# Patient Record
Sex: Female | Born: 1950 | Race: White | Hispanic: No | Smoking: Former smoker
Health system: Southern US, Community
[De-identification: ages and names within clinical notes are randomized; demographics above are authoritative.]

## PROBLEM LIST (undated history)

## (undated) DIAGNOSIS — I739 Peripheral vascular disease, unspecified: Secondary | ICD-10-CM

## (undated) DIAGNOSIS — T8859XA Other complications of anesthesia, initial encounter: Secondary | ICD-10-CM

## (undated) DIAGNOSIS — F32A Depression, unspecified: Secondary | ICD-10-CM

## (undated) DIAGNOSIS — Z9289 Personal history of other medical treatment: Secondary | ICD-10-CM

## (undated) DIAGNOSIS — M24529 Contracture, unspecified elbow: Secondary | ICD-10-CM

## (undated) DIAGNOSIS — N3281 Overactive bladder: Secondary | ICD-10-CM

## (undated) DIAGNOSIS — R011 Cardiac murmur, unspecified: Secondary | ICD-10-CM

## (undated) DIAGNOSIS — G8929 Other chronic pain: Secondary | ICD-10-CM

## (undated) DIAGNOSIS — I951 Orthostatic hypotension: Secondary | ICD-10-CM

## (undated) DIAGNOSIS — I10 Essential (primary) hypertension: Secondary | ICD-10-CM

## (undated) DIAGNOSIS — J984 Other disorders of lung: Secondary | ICD-10-CM

## (undated) DIAGNOSIS — F419 Anxiety disorder, unspecified: Secondary | ICD-10-CM

## (undated) DIAGNOSIS — H25012 Cortical age-related cataract, left eye: Secondary | ICD-10-CM

## (undated) DIAGNOSIS — N390 Urinary tract infection, site not specified: Secondary | ICD-10-CM

## (undated) DIAGNOSIS — T4145XA Adverse effect of unspecified anesthetic, initial encounter: Secondary | ICD-10-CM

## (undated) DIAGNOSIS — D509 Iron deficiency anemia, unspecified: Secondary | ICD-10-CM

## (undated) DIAGNOSIS — R1312 Dysphagia, oropharyngeal phase: Secondary | ICD-10-CM

## (undated) DIAGNOSIS — J439 Emphysema, unspecified: Secondary | ICD-10-CM

## (undated) DIAGNOSIS — Z87442 Personal history of urinary calculi: Secondary | ICD-10-CM

## (undated) DIAGNOSIS — E1140 Type 2 diabetes mellitus with diabetic neuropathy, unspecified: Secondary | ICD-10-CM

## (undated) DIAGNOSIS — E785 Hyperlipidemia, unspecified: Secondary | ICD-10-CM

## (undated) DIAGNOSIS — F329 Major depressive disorder, single episode, unspecified: Secondary | ICD-10-CM

## (undated) DIAGNOSIS — M21372 Foot drop, left foot: Secondary | ICD-10-CM

## (undated) DIAGNOSIS — K219 Gastro-esophageal reflux disease without esophagitis: Secondary | ICD-10-CM

## (undated) DIAGNOSIS — K922 Gastrointestinal hemorrhage, unspecified: Secondary | ICD-10-CM

## (undated) DIAGNOSIS — I639 Cerebral infarction, unspecified: Secondary | ICD-10-CM

## (undated) DIAGNOSIS — M1990 Unspecified osteoarthritis, unspecified site: Secondary | ICD-10-CM

## (undated) DIAGNOSIS — M24531 Contracture, right wrist: Secondary | ICD-10-CM

## (undated) DIAGNOSIS — R32 Unspecified urinary incontinence: Secondary | ICD-10-CM

## (undated) DIAGNOSIS — R262 Difficulty in walking, not elsewhere classified: Secondary | ICD-10-CM

## (undated) DIAGNOSIS — R29810 Facial weakness: Secondary | ICD-10-CM

## (undated) DIAGNOSIS — I6529 Occlusion and stenosis of unspecified carotid artery: Secondary | ICD-10-CM

## (undated) DIAGNOSIS — Z8719 Personal history of other diseases of the digestive system: Secondary | ICD-10-CM

## (undated) DIAGNOSIS — G8191 Hemiplegia, unspecified affecting right dominant side: Secondary | ICD-10-CM

## (undated) DIAGNOSIS — K5900 Constipation, unspecified: Secondary | ICD-10-CM

## (undated) DIAGNOSIS — G2581 Restless legs syndrome: Secondary | ICD-10-CM

## (undated) DIAGNOSIS — I350 Nonrheumatic aortic (valve) stenosis: Secondary | ICD-10-CM

## (undated) DIAGNOSIS — J189 Pneumonia, unspecified organism: Secondary | ICD-10-CM

## (undated) HISTORY — PX: LITHOTRIPSY: SUR834

## (undated) HISTORY — DX: Cortical age-related cataract, left eye: H25.012

## (undated) HISTORY — PX: JOINT REPLACEMENT: SHX530

## (undated) HISTORY — DX: Cerebral infarction, unspecified: I63.9

## (undated) HISTORY — DX: Other disorders of lung: J98.4

---

## 1982-03-03 HISTORY — PX: CHOLECYSTECTOMY: SHX55

## 1997-03-03 HISTORY — PX: KIDNEY STONE SURGERY: SHX686

## 2000-06-24 ENCOUNTER — Other Ambulatory Visit: Admission: RE | Admit: 2000-06-24 | Discharge: 2000-06-24 | Payer: Self-pay | Admitting: Gynecology

## 2000-07-30 ENCOUNTER — Encounter: Payer: Self-pay | Admitting: Gynecology

## 2000-07-31 ENCOUNTER — Other Ambulatory Visit: Admission: RE | Admit: 2000-07-31 | Discharge: 2000-07-31 | Payer: Self-pay | Admitting: Gynecology

## 2000-08-06 ENCOUNTER — Inpatient Hospital Stay (HOSPITAL_COMMUNITY): Admission: RE | Admit: 2000-08-06 | Discharge: 2000-08-08 | Payer: Self-pay | Admitting: Gynecology

## 2000-08-09 ENCOUNTER — Encounter: Payer: Self-pay | Admitting: Emergency Medicine

## 2000-08-09 ENCOUNTER — Emergency Department (HOSPITAL_COMMUNITY): Admission: EM | Admit: 2000-08-09 | Discharge: 2000-08-09 | Payer: Self-pay | Admitting: Emergency Medicine

## 2001-03-03 HISTORY — PX: ABDOMINAL HYSTERECTOMY: SUR658

## 2001-03-03 HISTORY — PX: BYPASS GRAFT: SHX909

## 2006-03-03 HISTORY — PX: TOTAL HIP ARTHROPLASTY: SHX124

## 2009-03-03 HISTORY — PX: APPENDECTOMY: SHX54

## 2009-03-03 HISTORY — PX: HERNIA REPAIR: SHX51

## 2011-03-04 HISTORY — PX: CAROTID STENT INSERTION: SHX5766

## 2011-05-02 DIAGNOSIS — I639 Cerebral infarction, unspecified: Secondary | ICD-10-CM

## 2011-05-02 HISTORY — DX: Cerebral infarction, unspecified: I63.9

## 2011-05-08 ENCOUNTER — Emergency Department (HOSPITAL_COMMUNITY): Payer: Medicare Other

## 2011-05-08 ENCOUNTER — Inpatient Hospital Stay (HOSPITAL_COMMUNITY): Payer: Medicare Other

## 2011-05-08 ENCOUNTER — Encounter (HOSPITAL_COMMUNITY): Payer: Self-pay | Admitting: Radiology

## 2011-05-08 ENCOUNTER — Other Ambulatory Visit: Payer: Self-pay

## 2011-05-08 ENCOUNTER — Inpatient Hospital Stay (HOSPITAL_COMMUNITY)
Admission: EM | Admit: 2011-05-08 | Discharge: 2011-05-14 | DRG: 041 | Disposition: A | Discharge status: Rehabilitation Facility | Payer: Medicare Other | Source: Ambulatory Visit | Attending: Internal Medicine | Admitting: Internal Medicine

## 2011-05-08 DIAGNOSIS — Z96649 Presence of unspecified artificial hip joint: Secondary | ICD-10-CM

## 2011-05-08 DIAGNOSIS — E785 Hyperlipidemia, unspecified: Secondary | ICD-10-CM | POA: Diagnosis present

## 2011-05-08 DIAGNOSIS — I10 Essential (primary) hypertension: Secondary | ICD-10-CM | POA: Diagnosis present

## 2011-05-08 DIAGNOSIS — IMO0001 Reserved for inherently not codable concepts without codable children: Secondary | ICD-10-CM | POA: Diagnosis present

## 2011-05-08 DIAGNOSIS — R220 Localized swelling, mass and lump, head: Secondary | ICD-10-CM | POA: Diagnosis present

## 2011-05-08 DIAGNOSIS — H5500 Unspecified nystagmus: Secondary | ICD-10-CM | POA: Diagnosis present

## 2011-05-08 DIAGNOSIS — G8190 Hemiplegia, unspecified affecting unspecified side: Secondary | ICD-10-CM | POA: Diagnosis present

## 2011-05-08 DIAGNOSIS — I6350 Cerebral infarction due to unspecified occlusion or stenosis of unspecified cerebral artery: Principal | ICD-10-CM | POA: Diagnosis present

## 2011-05-08 DIAGNOSIS — E1149 Type 2 diabetes mellitus with other diabetic neurological complication: Secondary | ICD-10-CM | POA: Diagnosis present

## 2011-05-08 DIAGNOSIS — I359 Nonrheumatic aortic valve disorder, unspecified: Secondary | ICD-10-CM

## 2011-05-08 DIAGNOSIS — F329 Major depressive disorder, single episode, unspecified: Secondary | ICD-10-CM | POA: Diagnosis present

## 2011-05-08 DIAGNOSIS — J984 Other disorders of lung: Secondary | ICD-10-CM | POA: Diagnosis present

## 2011-05-08 DIAGNOSIS — G2581 Restless legs syndrome: Secondary | ICD-10-CM | POA: Diagnosis present

## 2011-05-08 DIAGNOSIS — R221 Localized swelling, mass and lump, neck: Secondary | ICD-10-CM | POA: Diagnosis present

## 2011-05-08 DIAGNOSIS — R29810 Facial weakness: Secondary | ICD-10-CM | POA: Diagnosis present

## 2011-05-08 DIAGNOSIS — Z720 Tobacco use: Secondary | ICD-10-CM | POA: Diagnosis present

## 2011-05-08 DIAGNOSIS — F3289 Other specified depressive episodes: Secondary | ICD-10-CM | POA: Diagnosis present

## 2011-05-08 DIAGNOSIS — I639 Cerebral infarction, unspecified: Secondary | ICD-10-CM | POA: Diagnosis present

## 2011-05-08 DIAGNOSIS — R4789 Other speech disturbances: Secondary | ICD-10-CM | POA: Diagnosis present

## 2011-05-08 DIAGNOSIS — Z79899 Other long term (current) drug therapy: Secondary | ICD-10-CM

## 2011-05-08 DIAGNOSIS — R918 Other nonspecific abnormal finding of lung field: Secondary | ICD-10-CM | POA: Diagnosis present

## 2011-05-08 DIAGNOSIS — F17200 Nicotine dependence, unspecified, uncomplicated: Secondary | ICD-10-CM | POA: Diagnosis present

## 2011-05-08 HISTORY — DX: Major depressive disorder, single episode, unspecified: F32.9

## 2011-05-08 HISTORY — DX: Peripheral vascular disease, unspecified: I73.9

## 2011-05-08 HISTORY — DX: Hyperlipidemia, unspecified: E78.5

## 2011-05-08 HISTORY — DX: Depression, unspecified: F32.A

## 2011-05-08 HISTORY — DX: Essential (primary) hypertension: I10

## 2011-05-08 LAB — COMPREHENSIVE METABOLIC PANEL
ALT: 21 U/L (ref 0–35)
Albumin: 3.7 g/dL (ref 3.5–5.2)
Alkaline Phosphatase: 104 U/L (ref 39–117)
Potassium: 4.1 mEq/L (ref 3.5–5.1)
Sodium: 130 mEq/L — ABNORMAL LOW (ref 135–145)
Total Protein: 7.6 g/dL (ref 6.0–8.3)

## 2011-05-08 LAB — LIPID PANEL
HDL: 25 mg/dL — ABNORMAL LOW (ref >39)
LDL Cholesterol: 135 mg/dL — ABNORMAL HIGH (ref 0–99)
Total CHOL/HDL Ratio: 8.6 RATIO

## 2011-05-08 LAB — POCT I-STAT, CHEM 8
BUN: 16 mg/dL (ref 6–23)
Chloride: 98 mEq/L (ref 96–112)
HCT: 52 % — ABNORMAL HIGH (ref 36.0–46.0)
Sodium: 132 mEq/L — ABNORMAL LOW (ref 135–145)
TCO2: 27 mmol/L (ref 0–100)

## 2011-05-08 LAB — CBC
MCH: 32 pg (ref 26.0–34.0)
MCHC: 35.8 g/dL (ref 30.0–36.0)
Platelets: 141 10*3/uL — ABNORMAL LOW (ref 150–400)
RBC: 5.53 MIL/uL — ABNORMAL HIGH (ref 3.87–5.11)

## 2011-05-08 LAB — DIFFERENTIAL
Basophils Relative: 0 % (ref 0–1)
Eosinophils Absolute: 0 10*3/uL (ref 0.0–0.7)
Neutrophils Relative %: 76 % (ref 43–77)

## 2011-05-08 LAB — CK TOTAL AND CKMB (NOT AT ARMC): Total CK: 50 U/L (ref 7–177)

## 2011-05-08 LAB — IRON AND TIBC: Iron: <10 ug/dL — ABNORMAL LOW (ref 42–135)

## 2011-05-08 LAB — BASIC METABOLIC PANEL
BUN: 15 mg/dL (ref 6–23)
CO2: 27 mEq/L (ref 19–32)
Calcium: 9.4 mg/dL (ref 8.4–10.5)
Chloride: 95 mEq/L — ABNORMAL LOW (ref 96–112)
Creatinine, Ser: 0.52 mg/dL (ref 0.50–1.10)

## 2011-05-08 LAB — POCT I-STAT TROPONIN I: Troponin i, poc: 0.01 ng/mL (ref 0.00–0.08)

## 2011-05-08 LAB — GLUCOSE, CAPILLARY: Glucose-Capillary: 239 mg/dL — ABNORMAL HIGH (ref 70–99)

## 2011-05-08 LAB — HEMOGLOBIN A1C: Hgb A1c MFr Bld: 10.2 % — ABNORMAL HIGH (ref <5.7)

## 2011-05-08 LAB — FERRITIN: Ferritin: 115 ng/mL (ref 10–291)

## 2011-05-08 LAB — PROTIME-INR
INR: 0.91 (ref 0.00–1.49)
Prothrombin Time: 12.5 seconds (ref 11.6–15.2)

## 2011-05-08 LAB — TROPONIN I: Troponin I: <0.3 ng/mL (ref <0.30)

## 2011-05-08 MED ORDER — GABAPENTIN 300 MG PO CAPS
300.0000 mg | ORAL_CAPSULE | Freq: Every day | ORAL | Status: DC
  Administered 2011-05-08 – 2011-05-09 (×2): 300 mg via ORAL
  Filled 2011-05-08 (×3): qty 1

## 2011-05-08 MED ORDER — ACETAMINOPHEN 650 MG RE SUPP: 650.0000 mg | RECTAL | Status: DC | PRN

## 2011-05-08 MED ORDER — SODIUM CHLORIDE 0.9 % IV SOLN
INTRAVENOUS | Status: DC
  Administered 2011-05-08: 03:00:00 via INTRAVENOUS

## 2011-05-08 MED ORDER — INSULIN ASPART 100 UNIT/ML ~~LOC~~ SOLN
0.0000 [IU] | Freq: Three times a day (TID) | SUBCUTANEOUS | Status: DC
  Administered 2011-05-08: 11 [IU] via SUBCUTANEOUS
  Administered 2011-05-08 – 2011-05-09 (×2): 3 [IU] via SUBCUTANEOUS
  Administered 2011-05-09 – 2011-05-10 (×3): 5 [IU] via SUBCUTANEOUS
  Administered 2011-05-10: 2 [IU] via SUBCUTANEOUS
  Administered 2011-05-10: 5 [IU] via SUBCUTANEOUS
  Administered 2011-05-11 (×2): 3 [IU] via SUBCUTANEOUS
  Administered 2011-05-11: 2 [IU] via SUBCUTANEOUS
  Administered 2011-05-12: 5 [IU] via SUBCUTANEOUS
  Administered 2011-05-13 – 2011-05-14 (×4): 3 [IU] via SUBCUTANEOUS
  Filled 2011-05-08: qty 3

## 2011-05-08 MED ORDER — ROPINIROLE HCL 1 MG PO TABS
3.0000 mg | ORAL_TABLET | ORAL | Status: AC
  Administered 2011-05-08: 3 mg via ORAL
  Filled 2011-05-08: qty 3

## 2011-05-08 MED ORDER — ROPINIROLE HCL 1 MG PO TABS
3.0000 mg | ORAL_TABLET | Freq: Every day | ORAL | Status: DC
  Administered 2011-05-08 – 2011-05-13 (×6): 3 mg via ORAL
  Filled 2011-05-08 (×7): qty 3

## 2011-05-08 MED ORDER — GABAPENTIN 600 MG PO TABS: 300.0000 mg | ORAL_TABLET | Freq: Two times a day (BID) | ORAL | Status: DC

## 2011-05-08 MED ORDER — ASPIRIN 325 MG PO TABS
325.0000 mg | ORAL_TABLET | Freq: Every day | ORAL | Status: DC
  Administered 2011-05-08 – 2011-05-12 (×5): 325 mg via ORAL
  Filled 2011-05-08 (×5): qty 1

## 2011-05-08 MED ORDER — GABAPENTIN 600 MG PO TABS
600.0000 mg | ORAL_TABLET | ORAL | Status: AC
  Administered 2011-05-08: 600 mg via ORAL
  Filled 2011-05-08: qty 1

## 2011-05-08 MED ORDER — ENOXAPARIN SODIUM 40 MG/0.4ML ~~LOC~~ SOLN
40.0000 mg | SUBCUTANEOUS | Status: DC
  Administered 2011-05-08 – 2011-05-14 (×7): 40 mg via SUBCUTANEOUS
  Filled 2011-05-08 (×7): qty 0.4

## 2011-05-08 MED ORDER — LABETALOL HCL 5 MG/ML IV SOLN
10.0000 mg | INTRAVENOUS | Status: DC | PRN
  Administered 2011-05-09 – 2011-05-11 (×3): 10 mg via INTRAVENOUS
  Filled 2011-05-08 (×3): qty 4

## 2011-05-08 MED ORDER — CITALOPRAM HYDROBROMIDE 20 MG PO TABS
20.0000 mg | ORAL_TABLET | Freq: Every day | ORAL | Status: DC
  Administered 2011-05-08 – 2011-05-14 (×7): 20 mg via ORAL
  Filled 2011-05-08 (×7): qty 1

## 2011-05-08 MED ORDER — GABAPENTIN 600 MG PO TABS
600.0000 mg | ORAL_TABLET | Freq: Every day | ORAL | Status: DC
  Administered 2011-05-08 – 2011-05-13 (×6): 600 mg via ORAL
  Filled 2011-05-08 (×7): qty 1

## 2011-05-08 MED ORDER — SODIUM CHLORIDE 0.9 % IV SOLN
INTRAVENOUS | Status: DC
  Administered 2011-05-08: 10:00:00 via INTRAVENOUS
  Administered 2011-05-09: 50 mL/h via INTRAVENOUS

## 2011-05-08 MED ORDER — ONDANSETRON HCL 4 MG/2ML IJ SOLN
4.0000 mg | Freq: Four times a day (QID) | INTRAMUSCULAR | Status: DC | PRN
  Administered 2011-05-10: 4 mg via INTRAVENOUS
  Filled 2011-05-08: qty 2

## 2011-05-08 MED ORDER — ACETAMINOPHEN 325 MG PO TABS: 650.0000 mg | ORAL_TABLET | ORAL | Status: DC | PRN

## 2011-05-08 MED ORDER — SIMVASTATIN 20 MG PO TABS
20.0000 mg | ORAL_TABLET | Freq: Every day | ORAL | Status: DC
  Administered 2011-05-08 – 2011-05-09 (×2): 20 mg via ORAL
  Filled 2011-05-08 (×3): qty 1

## 2011-05-08 MED ORDER — LISINOPRIL 20 MG PO TABS
20.0000 mg | ORAL_TABLET | Freq: Every day | ORAL | Status: DC
  Administered 2011-05-08 – 2011-05-09 (×2): 20 mg via ORAL
  Filled 2011-05-08 (×3): qty 1

## 2011-05-08 MED ORDER — ASPIRIN 300 MG RE SUPP
300.0000 mg | Freq: Every day | RECTAL | Status: DC
  Filled 2011-05-08 (×4): qty 1

## 2011-05-08 MED ORDER — LABETALOL HCL 5 MG/ML IV SOLN
INTRAVENOUS | Status: AC
  Administered 2011-05-08: 10 mg via INTRAVENOUS
  Filled 2011-05-08: qty 4

## 2011-05-08 MED ORDER — SENNOSIDES-DOCUSATE SODIUM 8.6-50 MG PO TABS: 1.0000 | ORAL_TABLET | Freq: Every evening | ORAL | Status: DC | PRN

## 2011-05-08 NOTE — ED Notes (Signed)
Patient with restless leg problems.  Patient requesting meds for RLS.  MD notified.  New orders per Dr Rubin Payor.

## 2011-05-08 NOTE — Progress Notes (Signed)
05/08/2011 Adylin Hankey SPARKS Case Management Note [phone]  Utilization review completed.  

## 2011-05-08 NOTE — Progress Notes (Signed)
Inpatient Diabetes Program Recommendations  AACE/ADA: New Consensus Statement on Inpatient Glycemic Control (2009)  Target Ranges:  Prepandial:   less than 140 mg/dL      Peak postprandial:   less than 180 mg/dL (1-2 hours)      Critically ill patients:  140 - 180 mg/dL   Reason for Visit: Hyperglycemia in high 200's and 300's  Inpatient Diabetes Program Recommendations Insulin - Basal: Add 10 units Lantus while here. Correction (SSI): Please change to q 4hrs wile NPO HgbA1C: CBG's are high, please check present value while here.  Note: Thank you, Lenor Coffin, RN, CNS, Diabetes Coordinator [phone])

## 2011-05-08 NOTE — Progress Notes (Signed)
Physical Therapy Evaluation Patient Details Name: Chelsey Gallagher MRN: [id number] DOB: 03-02-1951 Today's Date: 05/08/2011  Problem List:  Patient Active Problem List  Diagnoses  . CVA (cerebral infarction)  . Hypertension  . Diabetes mellitus    Past Medical History:  Past Medical History  Diagnosis Date  . Diabetes mellitus   . Hypertension   . Depression   . PAD (peripheral artery disease)     S/p bypass grafting, unclear exactly where  . Nephrolithiasis    Past Surgical History:  Past Surgical History  Procedure Date  . Total hip arthroplasty     Right  . Abdominal hysterectomy   . Cholecystectomy 1984  . Appendectomy     PT Assessment/Plan/Recommendation PT Assessment Clinical Impression Statement: Pt presents with a medical diagnosis of CVA with impulsivity and decreased safety awareness. Pt also presents with decreased sensation and strength in her RLE although has had some return since admission. Pt will benefit from skilled PT in the acute care setting in order to improve safety, motor control and strength prior to d/c. PT Recommendation/Assessment: Patient will need skilled PT in the acute care venue PT Problem List: Decreased strength;Decreased range of motion;Decreased activity tolerance;Decreased balance;Decreased mobility;Decreased knowledge of use of DME;Decreased safety awareness;Decreased knowledge of precautions PT Therapy Diagnosis : Hemiplegia dominant side;Difficulty walking PT Plan PT Frequency: Min 4X/week PT Treatment/Interventions: DME instruction;Gait training;Stair training;Functional mobility training;Therapeutic activities;Therapeutic exercise;Balance training;Neuromuscular re-education;Patient/family education PT Recommendation Recommendations for Other Services: Rehab consult Follow Up Recommendations: Inpatient Rehab;Supervision/Assistance - 24 hour Equipment Recommended: Defer to next venue PT Goals  Acute Rehab PT Goals PT Goal  Formulation: With patient/family Time For Goal Achievement: 2 weeks Pt will go Supine/Side to Sit: with modified independence PT Goal: Supine/Side to Sit - Progress: Goal set today Pt will Sit at [facility] of Bed: with modified independence;3-5 min;with bilateral upper extremity support PT Goal: Sit at Edge Of Bed - Progress: Goal set today Pt will go Sit to Supine/Side: with modified independence PT Goal: Sit to Supine/Side - Progress: Goal set today Pt will go Sit to Stand: with min assist;with upper extremity assist PT Goal: Sit to Stand - Progress: Goal set today Pt will go Stand to Sit: with min assist;with upper extremity assist PT Goal: Stand to Sit - Progress: Goal set today Pt will Stand: with supervision;with bilateral upper extremity support;3 - 5 min PT Goal: Stand - Progress: Goal set today Pt will Ambulate: 16 - 50 feet;with min assist;with least restrictive assistive device PT Goal: Ambulate - Progress: Goal set today Pt will Perform Home Exercise Program: Independently PT Goal: Perform Home Exercise Program - Progress: Goal set today  PT Evaluation Precautions/Restrictions  Precautions Precautions: Fall Required Braces or Orthoses: No Restrictions Weight Bearing Restrictions: No Prior Functioning  Home Living Lives With: Alone Receives Help From: Family (intermittent assistance) Type of Home: House Home Layout: One level Home Access: Stairs to enter Entrance Stairs-Rails: None Entrance Stairs-Number of Steps: 1 (inch step in) Bathroom Shower/Tub: Walk-in shower;Door [employer] Toilet: Handicapped height Bathroom Accessibility: Yes How Accessible: Accessible via walker Home Adaptive Equipment: Walker - rolling;Bedside commode/3-in-1;Reacher Prior Function Level of Independence: Independent with basic ADLs;Independent with homemaking with ambulation;Independent with transfers;Independent with gait Able to Take Stairs?: Yes Driving: Yes Vocation: Retired Leisure:  Hobbies-yes (Comment) Comments: reading, computer Cognition Cognition Arousal/Alertness: Awake/alert Overall Cognitive Status: Impaired Orientation Level: Oriented X4 Safety/Judgement: Decreased safety judgement for tasks assessed Decreased Safety/Judgement: Impulsive Safety/Judgement - Other Comments: Pt very impulsive with movements and required constant  cuing to slow down. Awareness of Deficits: Other (comment) (aware of deficits, although still impulsive) Sensation/Coordination Sensation Light Touch: Impaired Detail Light Touch Impaired Details: Absent RUE;Impaired RLE (able to feel pain 2/4 times on RLE) Stereognosis: Impaired Detail Stereognosis Impaired Details: Absent RUE Proprioception: Impaired Detail Proprioception Impaired Details: Absent RUE Additional Comments: Pt reports that R UE feels like "pins and needles" Coordination Gross Motor Movements are Fluid and Coordinated: No (RUE) Fine Motor Movements are Fluid and Coordinated: No (RUE) Heel Shin Test: unable to complete secondary to weakness Extremity Assessment RUE Assessment RUE Assessment: Exceptions to [facility] RUE AROM (degrees) Overall AROM Right Upper Extremity: Deficits (No AROM) RUE Strength RUE Overall Strength Comments: 0/5 RUE Tone RUE Tone Comments: flaccid LUE Assessment LUE Assessment: Within Functional Limits RLE Assessment RLE Assessment: Exceptions to [facility] RLE AROM (degrees) Overall AROM Right Lower Extremity: Deficits;Due to decreased strength RLE Overall AROM Comments: No AROM ankle; decreased knee RLE Strength Right Hip Flexion: 3-/5 Right Knee Flexion: 2+/5 Right Knee Extension: 2+/5 Right Ankle Dorsiflexion: 2-/5 Right Ankle Plantar Flexion: 2-/5 LLE Assessment LLE Assessment: Within Functional Limits Mobility (including Balance) Bed Mobility Bed Mobility: Yes Rolling Right: 3: Mod assist;With rail Rolling Right Details (indicate cue type and reason): assist to control movement and  for safety due to pt's impulsive behavior Right Sidelying to Sit: 1: +2 Total assist;Patient percentage (comment);With rails (30%) Right Sidelying to Sit Details (indicate cue type and reason): assist to support trunk and shoulders OOB (Assist with RLE for control) Sitting - Scoot to Edge of Bed: 3: Mod assist Sitting - Scoot to Edge of Bed Details (indicate cue type and reason): assist for safety due to impulsive behavior (VC for sequencing and hand placement) Transfers Transfers: Yes Sit to Stand: 1: +2 Total assist;Patient percentage (comment);From bed;From chair/3-in-1;With upper extremity assist;With armrests (50%) Sit to Stand Details (indicate cue type and reason): assist to block Rt. knee and support Rt. UE and manual cues provided throughout bil. hips to promote upright posture. VC for safe Lt. hand placement. Stand to Sit: 1: +2 Total assist;Patient percentage (comment);To chair/3-in-1;With armrests (50%) Stand to Sit Details: assist to control descent and support Rt. UE. VC to for safe Lt. hand placement. Stand Pivot Transfers: 1: +2 Total assist;Patient percentage (comment) (30%) Stand Pivot Transfer Details (indicate cue type and reason): Assist x 1 to control buckling on RLE and assist of other for stability and control as well as tactile cues for proper sequencing both from bed to 3-1 and from 3-1 to chair. Transferred to right side for increased mobility. Pt required cueing throughout for safety secondary to impulsivity Ambulation/Gait Ambulation/Gait: No  Balance Balance Assessed: Yes Static Sitting Balance Static Sitting - Balance Support: Left upper extremity supported;Feet supported Static Sitting - Level of Assistance: 3: Mod assist Static Sitting - Comment/# of Minutes: Mod assist secondary to pt with right lean unsupported. Exercise    End of Session PT - End of Session Equipment Utilized During Treatment: Gait belt Activity Tolerance: Patient tolerated treatment  well Patient left: in chair;with call bell in reach;with family/visitor present Nurse Communication: Mobility status for transfers;Mobility status for ambulation General Behavior During Session: Other (comment) (impulsive) Cognition: Impaired Cognitive Impairment: decreased safety awareness  Milana Kidney 05/08/2011, 5:24 PM  05/08/2011 Milana Kidney DPT PAGER: [phone] OFFICE: [phone]

## 2011-05-08 NOTE — Progress Notes (Signed)
  Echocardiogram 2D Echocardiogram has been performed.  Chelsey Gallagher 05/08/2011, 5:48 PM

## 2011-05-08 NOTE — ED Notes (Signed)
CHECKED CBG BY EMT R Eriq [id number]

## 2011-05-08 NOTE — ED Notes (Signed)
Patient LSN at 2215 last evening, feeling funny as she went to bed.  Patient with no dizziness, but does have slurred speech, right sided facial droop and right leg weakness.  Patient arrived by EMS, CBG=298.  NSR rate in 70's.

## 2011-05-08 NOTE — Progress Notes (Signed)
Subjective:  Chelsey Gallagher is an 61 y.o. female who reports that she laid down about 10pm and felt fine at that time. Got up about 1015 and felt nauseous and dizzy. Had some vomiting. Looked in the mirror and noted that she had a right facial droop. Then noted numbness in her right upper extremity. It went to her right lower extremity and then to the right ear. She attempted to call her daughter but had difficulty managing to et to the phone and then dial. When speaking noticed that her speech was slurred. Her daughter called EMS. EMS initiated the code stroke She presented beyond IV TPA window. She continues to have slurred speech, right facial weakness right hemiplegia and sensory loss. She also complains of intermittent horizontal diplopia on right gaze     Objective: Vital signs in last 24 hours: Temp:  [97.4 F (36.3 C)-97.9 F (36.6 C)] 97.9 F (36.6 C) (03/07 0413) Pulse Rate:  [75-92] 92  (03/07 0535) Resp:  [14-20] 20  (03/07 0535) BP: (137-207)/(67-102) 161/70 mmHg (03/07 0608) SpO2:  [91 %-96 %] 91 % (03/07 0535) Weight:  [67.087 kg (147 lb 14.4 oz)] 67.087 kg (147 lb 14.4 oz) (03/07 0645) Weight change:  Last BM Date: 05/07/11  Intake/Output from previous day:   Intake/Output this shift:    Physical exam   pleasant middle-aged Caucasian lady currently not in distress. Afebrile. Head is nontraumatic. Neck is supple with soft carotid bruit. Hearing appears decreased on the right. Distal pulses are well.Cardiac exam no murmur or gallop. Lungs are clear to auscultation. Abdomen soft nontender. Neurological Exam awake alert oriented to time place and person. Moderate dysarthria but can be understood. Follows commands well. Right gaze preference but able to look to the left. Mild hypertrophia of the right eye. Rotatory nystagmus on lateral gaze right more than left. Moderate right lower facial weakness. Tongue is midline. Dense right upper extremity plegia with 0/5 strength.  Right lower extremity grade 2-3/5 strength. Diminished sensation on the right. Impaired coordination on the right. Gait was not tested.  Lab Results:  Basename 05/08/11 0221 05/08/11 0219  WBC -- 7.0  HGB 17.7* 17.7*  HCT 52.0* 49.5*  PLT -- 141*   BMET  Basename 05/08/11 0221 05/08/11 0219  NA 132* 130*  K 4.0 4.1  CL 98 92*  CO2 -- 26  GLUCOSE 314* 309*  BUN 16 14  CREATININE 0.40* 0.48*  CALCIUM -- 10.1    Studies/Results: Dg Chest 2 View  05/08/2011  *RADIOLOGY REPORT*  Clinical Data: Stroke, right-sided weakness, shortness of breath  CHEST - 2 VIEW  Comparison: None.  Findings: Lungs are essentially clear. No pleural effusion or pneumothorax.  Cardiomediastinal silhouette is within normal limits.  Degenerative changes of the visualized thoracolumbar spine.  Surgical clips in the right upper abdomen.  IMPRESSION: No evidence of acute cardiopulmonary disease.  Original Report Authenticated By: Charline Bills, M.D.   Ct Head Wo Contrast  05/08/2011  *RADIOLOGY REPORT*  Clinical Data:  Code stroke, right side weakness  CT HEAD WITHOUT CONTRAST  Technique:  Contiguous axial images were obtained from the base of the skull through the vertex without contrast.  Comparison: None  Findings: Normal ventricular morphology. No midline shift or mass effect. Normal appearance of brain parenchyma. No intracranial hemorrhage, mass lesion or evidence of acute infarction. No extra-axial fluid collections. Atherosclerotic calcification within internal carotid arteries at skull base bilaterally. Visualized paranasal sinuses and mastoid air cells clear. Bones unremarkable.  IMPRESSION: No  acute intracranial abnormalities.  These results were called by telephone on 05/08/2011  at  0230 hrs to  Dr. Rubin Payor, who verbally acknowledged these results.  Original Report Authenticated By: Lollie Marrow, M.D.     Medications: I have reviewed the patient's current medications.  Assessment/Plan:  61 y.o.  female presenting with nystagmus, dysarthria and right hemiparesis of acute onset from right [place] infarct. Patient with multiple risk factors for stroke. CT shows no evidence for bleed. Patient outside time window for tPA.   Stroke work up initiated. Stroke Risk Factors - diabetes mellitus, hyperlipidemia, hypertension and smoking   Plan ; check swallow eval by speech therapy. Physical, occupational therapy consults. Check MRI scan today , Doppler studies, echocardiogram, fasting lipid profile and Hb A1c. Discussed with patient and family members and answered questions   LOS: 1 day   Jeannene Tschetter,PRAMODKUMAR P 05/08/2011, 10:31 AM

## 2011-05-08 NOTE — Evaluation (Signed)
Clinical/Bedside Swallow Evaluation Patient Details  Name: Chelsey Gallagher MRN: [id number] DOB: 1950-09-02 Today's Date: 05/08/2011  Past Medical History:  Past Medical History  Diagnosis Date  . Diabetes mellitus   . Hypertension   . Depression   . PAD (peripheral artery disease)     S/p bypass grafting, unclear exactly where  . Nephrolithiasis    Past Surgical History:  Past Surgical History  Procedure Date  . Total hip arthroplasty     Right  . Abdominal hysterectomy   . Cholecystectomy 1984  . Appendectomy    HPI:  61 yr old admitted with right sided weakness.  No acute events on CT.  Pt. initially passed RN stroke swallow screen then exhibited difficulty taking pills so she was made NPO and ST swallow assessment ordered.  Pt. reported some difficulty taking larger pills at home and twice a week with coughing episodes with emesis.    Assessment/Recommendations/Treatment Plan    SLP Assessment Clinical Impression Statement: Pt. exhibited min-mild oral dysphagia with mild right sided residue with cracker and slower transit.  Pharygneal phase appeared WFL's for all consistencies assessed.  Oral weakness increases aspiration risk slightly.  Recommend Dys 3 diet with thin liquids (no straws) and pills whole in applesauce.  Prognosis to upgrade to regular texture soon is good. Risk for Aspiration: Mild Other Related Risk Factors: Lethargy  Swallow Evaluation Recommendations Solid Consistency: Dysphagia 3 (Mechanical soft) Liquid Consistency: Thin Liquid Administration via: Cup;No straw Medication Administration: Whole meds with liquid Supervision: Full supervision/cueing for compensatory strategies;Patient able to self feed Compensations: Slow rate;Small sips/bites;Check for pocketing Postural Changes and/or Swallow Maneuvers: Seated upright 90 degrees Oral Care Recommendations: Oral care BID Recommendations for Other Services: OT consult;PT consult;Rehab consult Follow up  Recommendations:  (TBD)  Treatment Plan Treatment Plan Recommendations: Therapy as outlined in treatment plan below Speech Therapy Frequency: min 2x/week Treatment Duration: 2 weeks Interventions: Aspiration precaution training;Diet toleration management by SLP;Trials of upgraded texture/liquids;Patient/family education;Compensatory techniques  Prognosis Prognosis for Safe Diet Advancement: Good  Individuals Consulted Consulted and Agree with Results and Recommendations: Patient;Family member/caregiver  Swallowing Goals  SLP Swallowing Goals Patient will consume recommended diet without observed clinical signs of aspiration with: Minimal assistance Patient will utilize recommended strategies during swallow to increase swallowing safety with: Minimal assistance  Swallow Study General  Respiratory Status: Room air Behavior/Cognition: Alert;Cooperative;Pleasant mood Oral Cavity - Dentition: Adequate natural dentition Vision: Functional for self-feeding Patient Positioning: Upright in bed Baseline Vocal Quality: Clear Volitional Cough: Strong Volitional Swallow: Able to elicit  Oral Motor/Sensory Function  Overall Oral Motor/Sensory Function: Impaired Labial ROM: Reduced right Labial Symmetry: Within Functional Limits Labial Strength: Reduced Labial Sensation: Reduced Lingual ROM: Within Functional Limits Lingual Symmetry: Within Functional Limits Lingual Sensation: Within Functional Limits Facial ROM: Within Functional Limits Facial Symmetry: Within Functional Limits Velum:  (decreased) Mandible: Within Functional Limits  Consistency Results  Ice Chips Ice chips: Within functional limits Presentation: Spoon  Thin Liquid Thin Liquid: Within functional limits Presentation: Cup  Nectar Thick Liquid Nectar Thick Liquid: Not tested  Honey Thick Liquid Honey Thick Liquid: Not tested  Puree Puree: Within functional limits Presentation: Spoon;Self  Fed  Solid Solid: Impaired Presentation: Self Fed Oral Phase Impairments: Impaired anterior to posterior transit Oral Phase Functional Implications: Oral residue   Breck Coons Tariyah Pendry M.Ed [employer] [phone]  05/08/2011

## 2011-05-08 NOTE — Code Documentation (Addendum)
61 yo wf brought in via [facility] for Code Stroke encoded at 0150. Pt stated she went to bed at 2200 and was unable to sleep and got up at 2215 when she noticed that she felt unusual. She noticed at that point that she had Rt side weakness. Code stroke called 0154, pt arrival 0213, edp exam 0213, stroke team arrival 0202, LSN 2215, pt arrival in CT 0220, phlebotomist arrival 0213, code stroke cancelled 0236. NIH 5, Rt arm drift, Rt leg can't resist gravity, mild dysarthria, mild sensory loss, positive for nystagmus.  Pt does express feelings of dizziness.

## 2011-05-08 NOTE — Progress Notes (Signed)
Patient ID: Chelsey Gallagher, female   DOB: [birth year], 61 y.o.   MRN: [id number]  Subjective: No events overnight. Patient denies chest pain, shortness of breath, abdominal pain.  Objective:  Vital signs in last 24 hours:  Filed Vitals:   05/08/11 1000 05/08/11 1200 05/08/11 1500 05/08/11 1700  BP: 175/76 130/70 101/68 110/70  Pulse: 85 80 81 80  Temp: 98 F (36.7 C) 98.5 F (36.9 C) 98.7 F (37.1 C) 98.5 F (36.9 C)  TempSrc: Oral Oral Oral Oral  Resp: 18 17 18 17   Height:      Weight:      SpO2: 91% 94% 95% 95%    Intake/Output from previous day:  No intake or output data in the 24 hours ending 05/08/11 2048  Physical Exam: General: Alert, awake, oriented x3, in no acute distress. HEENT: No bruits, no goiter. Moist mucous membranes, no scleral icterus, no conjunctival pallor. Heart: Regular rate and rhythm, S1/S2 +, no murmurs, rubs, gallops. Lungs: Clear to auscultation bilaterally. No wheezing, no rhonchi, no rales.  Abdomen: Soft, nontender, nondistended, positive bowel sounds. Extremities: No clubbing or cyanosis, no pitting edema,  positive pedal pulses. Neuro: Slurred speech with facial droop, right sided paralysis, sensation intact to soft touch bilaterally  Lab Results:  Basic Metabolic Panel:    Component Value Date/Time   NA 130* 05/08/2011 1227   K 4.1 05/08/2011 1227   CL 95* 05/08/2011 1227   CO2 27 05/08/2011 1227   BUN 15 05/08/2011 1227   CREATININE 0.52 05/08/2011 1227   GLUCOSE 275* 05/08/2011 1227   CALCIUM 9.4 05/08/2011 1227   CBC:    Component Value Date/Time   WBC 7.0 05/08/2011 0219   HGB 17.7* 05/08/2011 0221   HCT 52.0* 05/08/2011 0221   PLT 141* 05/08/2011 0219   MCV 89.5 05/08/2011 0219   NEUTROABS 5.3 05/08/2011 0219   LYMPHSABS 1.2 05/08/2011 0219   MONOABS 0.4 05/08/2011 0219   EOSABS 0.0 05/08/2011 0219   BASOSABS 0.0 05/08/2011 0219      Lab 05/08/11 0221 05/08/11 0219  WBC -- 7.0  HGB 17.7* 17.7*  HCT 52.0* 49.5*  PLT -- 141*  MCV -- 89.5    MCH -- 32.0  MCHC -- 35.8  RDW -- 13.6  LYMPHSABS -- 1.2  MONOABS -- 0.4  EOSABS -- 0.0  BASOSABS -- 0.0  BANDABS -- --    Lab 05/08/11 1227 05/08/11 0221 05/08/11 0219  NA 130* 132* 130*  K 4.1 4.0 4.1  CL 95* 98 92*  CO2 27 -- 26  GLUCOSE 275* 314* 309*  BUN 15 16 14   CREATININE 0.52 0.40* 0.48*  CALCIUM 9.4 -- 10.1  MG -- -- --    Lab 05/08/11 0219  INR 0.91  PROTIME --   Cardiac markers:  Lab 05/08/11 0219  CKMB 2.2  TROPONINI <0.30  MYOGLOBIN --   No components found with this basename: POCBNP:3 No results found for this or any previous visit (from the past 240 hour(s)).  Studies/Results: Dg Chest 2 View  05/08/2011  *RADIOLOGY REPORT*  Clinical Data: Stroke, right-sided weakness, shortness of breath  CHEST - 2 VIEW  Comparison: None.  Findings: Lungs are essentially clear. No pleural effusion or pneumothorax.  Cardiomediastinal silhouette is within normal limits.  Degenerative changes of the visualized thoracolumbar spine.  Surgical clips in the right upper abdomen.  IMPRESSION: No evidence of acute cardiopulmonary disease.  Original Report Authenticated By: Charline Bills, M.D.   Ct Head Wo Contrast  05/08/2011  *RADIOLOGY REPORT*  Clinical Data:  Code stroke, right side weakness  CT HEAD WITHOUT CONTRAST  Technique:  Contiguous axial images were obtained from the base of the skull through the vertex without contrast.  Comparison: None  Findings: Normal ventricular morphology. No midline shift or mass effect. Normal appearance of brain parenchyma. No intracranial hemorrhage, mass lesion or evidence of acute infarction. No extra-axial fluid collections. Atherosclerotic calcification within internal carotid arteries at skull base bilaterally. Visualized paranasal sinuses and mastoid air cells clear. Bones unremarkable.  IMPRESSION: No acute intracranial abnormalities.  These results were called by telephone on 05/08/2011  at  0230 hrs to  Dr. Rubin Payor, who verbally  acknowledged these results.  Original Report Authenticated By: Lollie Marrow, M.D.   Mr Brain Wo Contrast  05/08/2011  *RADIOLOGY REPORT*  Clinical Data:  Right-sided weakness with slurred speech and facial droop. High blood pressure.  Diabetes.  MRI HEAD WITHOUT CONTRAST MRA HEAD WITHOUT CONTRAST  Technique: Multiplanar, multiecho pulse sequences of the brain and surrounding structures were obtained according to standard protocol without intravenous contrast.  Angiographic images of the head were obtained using MRA technique without contrast.  Comparison: 05/08/2011 head CT.  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 Tiny acute non hemorrhagic posterior left cerebellar infarct.  Mild to moderate nonspecific white matter type changes most consistent with result of small vessel disease in this hypertensive diabetic patient.  This involves the subcortical periventricular region as well as a central pons.  Cava septum pellucidum et vergae incidentally noted.  No hydrocephalus.  Abnormal appearance of the left vertebral artery and left carotid artery.  Please see below.  Polypoid opacification inferior aspect right maxillary sinus. Minimal paranasal sinus mucosal thickening.  No intracranial mass lesion detected on this unenhanced motion degraded exam.  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  Tiny acute non hemorrhagic posterior left cerebellar infarct.  Please see above.  MRA HEAD  Findings: Exam limited by motion.  Left internal carotid artery is small in caliber.  This may be related to proximal stenosis.  Superimposed atherosclerotic type changes (dissection not excluded).  Nonvisualization left vertebral artery and left PICA.  This may be related to the occlusion of atherosclerosis.  Dissection not excluded.  Ectatic right internal carotid artery.  Small bulge proximal cavernous segment may be related to atherosclerosis rather than aneurysm.   Stability can be confirmed on follow-up.  Prominent ectasia right carotid terminus.  No definitive aneurysm. Evaluation limited by motion.  Mild to moderate narrowing of the M1 segment of the right middle cerebral artery.  Mild to moderate narrowing M1 segment left middle cerebral artery.  Hypoplastic versus markedly diseased A1 segment of the left anterior cerebral artery.  Middle cerebral artery moderate branch vessel irregularity.  Mild narrowing distal right vertebral artery.  Mild to moderate irregularity with areas of mild to moderate narrowing basilar artery.  Nonvisualization right AICA.  Fetal type origin of the right posterior cerebral artery.  Moderate to marked narrowing involving portions of the posterior cerebral artery bilaterally.  IMPRESSION: Left internal carotid artery is small in caliber.  This may be related to proximal stenosis.  Superimposed atherosclerotic type changes (dissection not excluded).  Nonvisualization left vertebral artery and left PICA.  This may be related to the occlusion of atherosclerosis.  Dissection not excluded.  Please see above.  Original Report Authenticated By: Fuller Canada, M.D.   Mr Mra Head/brain Wo Cm  05/08/2011  *RADIOLOGY  REPORT*  Clinical Data:  Right-sided weakness with slurred speech and facial droop. High blood pressure.  Diabetes.  MRI HEAD WITHOUT CONTRAST MRA HEAD WITHOUT CONTRAST  Technique: Multiplanar, multiecho pulse sequences of the brain and surrounding structures were obtained according to standard protocol without intravenous contrast.  Angiographic images of the head were obtained using MRA technique without contrast.  Comparison: 05/08/2011 head CT.  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 Tiny acute non hemorrhagic posterior left cerebellar infarct.  Mild to moderate nonspecific white matter type changes most consistent with result of small vessel disease in this hypertensive diabetic patient.  This  involves the subcortical periventricular region as well as a central pons.  Cava septum pellucidum et vergae incidentally noted.  No hydrocephalus.  Abnormal appearance of the left vertebral artery and left carotid artery.  Please see below.  Polypoid opacification inferior aspect right maxillary sinus. Minimal paranasal sinus mucosal thickening.  No intracranial mass lesion detected on this unenhanced motion degraded exam.  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  Tiny acute non hemorrhagic posterior left cerebellar infarct.  Please see above.  MRA HEAD  Findings: Exam limited by motion.  Left internal carotid artery is small in caliber.  This may be related to proximal stenosis.  Superimposed atherosclerotic type changes (dissection not excluded).  Nonvisualization left vertebral artery and left PICA.  This may be related to the occlusion of atherosclerosis.  Dissection not excluded.  Ectatic right internal carotid artery.  Small bulge proximal cavernous segment may be related to atherosclerosis rather than aneurysm.  Stability can be confirmed on follow-up.  Prominent ectasia right carotid terminus.  No definitive aneurysm. Evaluation limited by motion.  Mild to moderate narrowing of the M1 segment of the right middle cerebral artery.  Mild to moderate narrowing M1 segment left middle cerebral artery.  Hypoplastic versus markedly diseased A1 segment of the left anterior cerebral artery.  Middle cerebral artery moderate branch vessel irregularity.  Mild narrowing distal right vertebral artery.  Mild to moderate irregularity with areas of mild to moderate narrowing basilar artery.  Nonvisualization right AICA.  Fetal type origin of the right posterior cerebral artery.  Moderate to marked narrowing involving portions of the posterior cerebral artery bilaterally.  IMPRESSION: Left internal carotid artery is small in caliber.  This may be related to proximal stenosis.  Superimposed  atherosclerotic type changes (dissection not excluded).  Nonvisualization left vertebral artery and left PICA.  This may be related to the occlusion of atherosclerosis.  Dissection not excluded.  Please see above.  Original Report Authenticated By: Fuller Canada, M.D.    Medications: Scheduled Meds:   . aspirin  300 mg Rectal Daily   Or  . aspirin  325 mg Oral Daily  . citalopram  20 mg Oral Daily  . enoxaparin  40 mg Subcutaneous Q24H  . gabapentin  300 mg Oral Q supper  . gabapentin  600 mg Oral To Major  . gabapentin  600 mg Oral QHS  . insulin aspart  0-15 Units Subcutaneous TID WC  . labetalol      . lisinopril  20 mg Oral Daily  . rOPINIRole  3 mg Oral To Major  . rOPINIRole  3 mg Oral QHS  . simvastatin  20 mg Oral q1800  . DISCONTD: gabapentin  300-600 mg Oral BID   Continuous Infusions:   . sodium chloride 50 mL/hr at 05/08/11 1025  . DISCONTD: sodium chloride 100 mL/hr at  05/08/11 0321   PRN Meds:.acetaminophen, acetaminophen, labetalol, ondansetron (ZOFRAN) IV, senna-docusate  Assessment/Plan:  Principal Problem:  *CVA (cerebral infarction) - stroke service following - PT/OT/SLP in progress and will follow up on recommendations - continue with stroke work up including carotid dopplers, 2 D ECHO - ensure BP and diabetes control - lipid panel pending  Active Problems:  Hypertension - remains stable - continue to monitor vitals per floor protocol   Diabetes mellitus - check A1C and adjust the regimen as indicated   EDUCATION - test results and diagnostic studies were discussed with patient and pt's family who was present at the bedside - patient and family have verbalized the understanding - questions were answered at the bedside and contact information was provided for additional questions or concerns   LOS: 0 days   MAGICK-Reace Breshears 05/08/2011, 8:48 PM  [facility] Pager: [phone]

## 2011-05-08 NOTE — ED Notes (Signed)
Code Stroke encoded: 0150 Code Stroke called: 0154 Patient arrival: 0213 EDP exam: 0213 Stroke Team arrival: 0202 LSN: [id number] PT arrival in CT: 0220 Phlebotomist arrival: 0213 CT read: 0225 Code Stroke cancelled: 0236

## 2011-05-08 NOTE — ED Provider Notes (Signed)
History     CSN: [id number]  Arrival date & time 05/08/11  0213   First MD Initiated Contact with Patient 05/08/11 0206      Chief Complaint  Patient presents with  . Code Stroke    (Consider location/radiation/quality/duration/timing/severity/associated sxs/prior treatment) The history is provided by the patient and the EMS personnel.   patient came to the ER as a code stroke. She was last seen normal at 10:15. She states she felt a little funny when she went to bed. When she woke up she had slurred speech right-sided patient to her right leg weakness. This is new for her. She's continued to have symptoms. No headache. Her sugar was 300 for EMS. She states her hands and foot feel numb. She has not had symptoms like this before.  Past Medical History  Diagnosis Date  . Diabetes mellitus   . Hypertension   . Depression     History reviewed. No pertinent past surgical history.  History reviewed. No pertinent family history.  History  Substance Use Topics  . Smoking status: Current Everyday Smoker  . Smokeless tobacco: Not on file  . Alcohol Use: No    OB History    Grav Para Term Preterm Abortions TAB SAB Ect Mult Living                  Review of Systems  Constitutional: Negative for activity change and appetite change.  HENT: Negative for neck stiffness.   Eyes: Negative for pain.  Respiratory: Negative for chest tightness and shortness of breath.   Cardiovascular: Negative for chest pain and leg swelling.  Gastrointestinal: Negative for nausea, vomiting, abdominal pain and diarrhea.  Genitourinary: Negative for flank pain.  Musculoskeletal: Negative for back pain.  Skin: Negative for rash.  Neurological: Positive for weakness and numbness. Negative for headaches.  Psychiatric/Behavioral: Negative for behavioral problems.    Allergies  Codeine and Flexeril  Home Medications   Current Outpatient Rx  Name Route Sig Dispense Refill  . CITALOPRAM HYDROBROMIDE  20 MG PO TABS Oral Take 20 mg by mouth daily.    Marland Kitchen DICYCLOMINE HCL 10 MG PO CAPS Oral Take 10 mg by mouth 4 (four) times daily -  before meals and at bedtime.    Marland Kitchen GABAPENTIN 600 MG PO TABS Oral Take 300-600 mg by mouth 2 (two) times daily. 300mg  at supper and 600mg  at bedtime    . GLYBURIDE MICRONIZED 3 MG PO TABS Oral Take 6 mg by mouth 2 (two) times daily before a meal.    . HYDROCODONE-ACETAMINOPHEN 7.5-325 MG PO TABS Oral Take 0.5-1 tablets by mouth every 4 (four) hours as needed. For pain    . LISINOPRIL 20 MG PO TABS Oral Take 20 mg by mouth daily.    Marland Kitchen METFORMIN HCL 500 MG PO TABS Oral Take 1,000-1,500 mg by mouth 2 (two) times daily with a meal. 1500 mg every morning and 1000 mg every evening    . PRAVASTATIN SODIUM 40 MG PO TABS Oral Take 80 mg by mouth at bedtime.    Marland Kitchen ROPINIROLE HCL 3 MG PO TABS Oral Take 3 mg by mouth at bedtime.      BP 137/92  Pulse 88  Temp(Src) 97.8 F (36.6 C) (Oral)  Resp 19  SpO2 93%  Physical Exam  Nursing note and vitals reviewed. Constitutional: She is oriented to person, place, and time. She appears well-developed and well-nourished.  HENT:  Head: Normocephalic and atraumatic.  Eyes: EOM are normal.  Pupils are equal, round, and reactive to light.  Neck: Normal range of motion. Neck supple.  Cardiovascular: Normal rate, regular rhythm and normal heart sounds.   No murmur heard. Pulmonary/Chest: Effort normal and breath sounds normal. No respiratory distress. She has no wheezes. She has no rales.  Abdominal: Soft. Bowel sounds are normal. She exhibits no distension. There is no tenderness. There is no rebound and no guarding.  Musculoskeletal: Normal range of motion.  Neurological: She is alert and oriented to person, place, and time. No cranial nerve deficit.       Extraocular movements intact. Decreased strength on right upper and lower extremity. Sensation appears grossly intact. Right-sided facial droop. Complete NIH score done by the  neurology team  Skin: Skin is warm and dry.  Psychiatric: She has a normal mood and affect. Her speech is normal.    ED Course  Procedures (including critical care time)  Labs Reviewed  CBC - Abnormal; Notable for the following:    RBC 5.53 (*)    Hemoglobin 17.7 (*)    HCT 49.5 (*)    Platelets 141 (*)    All other components within normal limits  COMPREHENSIVE METABOLIC PANEL - Abnormal; Notable for the following:    Sodium 130 (*)    Chloride 92 (*)    Glucose, Bld 309 (*)    Creatinine, Ser 0.48 (*)    All other components within normal limits  POCT I-STAT, CHEM 8 - Abnormal; Notable for the following:    Sodium 132 (*)    Creatinine, Ser 0.40 (*)    Glucose, Bld 314 (*)    Hemoglobin 17.7 (*)    HCT 52.0 (*)    All other components within normal limits  GLUCOSE, CAPILLARY - Abnormal; Notable for the following:    Glucose-Capillary 300 (*)    All other components within normal limits  PROTIME-INR  APTT  DIFFERENTIAL  CK TOTAL AND CKMB  TROPONIN I  POCT I-STAT TROPONIN I   Ct Head Wo Contrast  05/08/2011  *RADIOLOGY REPORT*  Clinical Data:  Code stroke, right side weakness  CT HEAD WITHOUT CONTRAST  Technique:  Contiguous axial images were obtained from the base of the skull through the vertex without contrast.  Comparison: None  Findings: Normal ventricular morphology. No midline shift or mass effect. Normal appearance of brain parenchyma. No intracranial hemorrhage, mass lesion or evidence of acute infarction. No extra-axial fluid collections. Atherosclerotic calcification within internal carotid arteries at skull base bilaterally. Visualized paranasal sinuses and mastoid air cells clear. Bones unremarkable.  IMPRESSION: No acute intracranial abnormalities.  These results were called by telephone on 05/08/2011  at  0230 hrs to  Dr. Rubin Payor, who verbally acknowledged these results.  Original Report Authenticated By: Lollie Marrow, M.D.     1. Stroke       MDM    Patient came in as a code stroke. Right-sided deficits. She's not a TPA candidate due to time of onset. She seen in the ER upon arrival by Dr. Thad Ranger. Initial head CT did not show a bleed. She'll be admitted to medicine        Harrold Donath R. Rubin Payor, MD 05/08/11 4098

## 2011-05-08 NOTE — Consult Note (Signed)
Referring Physician: Rubin Payor     Chief Complaint: Difficulty with speech, right sided numbness and weakness  HPI: Chelsey Gallagher is an 61 y.o. female who reports that she laid down about 10pm and felt fine at that time.  Got up about 1015 and felt nauseous and dizzy.  Had some vomiting.  Looked in the mirror and noted that she had a right facial droop.  Then noted numbness in her right upper extremity.  It went to her right lower extremity and then to the right ear.  She attempted to call her daughter but had difficulty managing to et to the phone and then dial.  When speaking noticed that her speech was slurred.  Her daughter called EMS.  EMS initiated the code stroke    LSN: [id number] tPA Given: No: Outside time window mRankin: 0  Past Medical History  Diagnosis Date  . Diabetes mellitus   . Hypertension   . Depression     -   Restless leg syndrome   -   Hyperlipidemia  History reviewed. No pertinent past surgical history.  History reviewed. No pertinent family history. Social History:  reports that she has been smoking.  She does not have any smokeless tobacco history on file. She reports that she does not drink alcohol or use illicit drugs.  Allergies:  Allergies  Allergen Reactions  . Codeine   . Flexeril (Cyclobenzaprine Hcl)     Medications: I have reviewed the patient's current medications. Prior to Admission:  Metformin, Gabapentin, Lisinopril, Ropinirole, Pravastatin, Vicodin, Dicyclomine, Glyburide, Citalopram  ROS: History obtained from the patient  General ROS: negative for - chills, fatigue, fever, night sweats, weight gain or weight loss Psychological ROS: negative for - behavioral disorder, hallucinations, memory difficulties, mood swings or suicidal ideation Ophthalmic ROS: negative for - blurry vision, double vision, eye pain or loss of vision ENT ROS: negative for - epistaxis, nasal discharge, oral lesions, sore throat, tinnitus or vertigo Allergy and  Immunology ROS: negative for - hives or itchy/watery eyes Hematological and Lymphatic ROS: negative for - bleeding problems, bruising or swollen lymph nodes Endocrine ROS: negative for - galactorrhea, hair pattern changes, polydipsia/polyuria or temperature intolerance Respiratory ROS: negative for - cough, hemoptysis, shortness of breath or wheezing Cardiovascular ROS: negative for - chest pain, dyspnea on exertion, edema or irregular heartbeat Gastrointestinal ROS: as noted in HPI Genito-Urinary ROS: negative for - dysuria, hematuria, incontinence or urinary frequency/urgency Musculoskeletal ROS: cramp in right leg Neurological ROS: as noted in HPI Dermatological ROS: negative for rash and skin lesion changes  Physical Examination: BP- 166/88   HR-78  RR-18  Neurologic Examination: Mental Status: Alert, oriented, thought content appropriate.  Speech fluent without evidence of aphasia but dysarthric.  Able to follow 3 step commands without difficulty. Cranial Nerves: II: visual fields grossly normal, pupils equal, round, reactive to light and accommodation III,IV, VI: ptosis not present, extra-ocular motions intact bilaterally with nystagmus on left lateral gaze V,VII: decrease in right NLF, facial light touch sensation decreased at the right ear VIII: hearing decreased bilaterally IX,X: gag reflex present XI: trapezius strength/neck flexion strength normal bilaterally XII: tongue strength normal  Motor: Right : Upper extremity   3/5    Left:     Upper extremity   5/5  Lower extremity   2/5     Lower extremity   5/5 Tone and bulk:normal tone throughout; no atrophy noted Sensory: Pinprick and light touch decreased on the right Deep Tendon Reflexes: 2+ and symmetric with  absent AJ's bilaterally Plantars: Right: upgoing   Left: upgoing Cerebellar: normal finger-to-nose and normal heel-to-shin test on the left.  Unable to perform on the right secondary to weakness   Results for  orders placed during the hospital encounter of 05/08/11 (from the past 48 hour(s))  POCT I-STAT TROPONIN I     Status: Normal   Collection Time   05/08/11  2:19 AM      Component Value Range Comment   Troponin i, poc 0.01  0.00 - 0.08 (ng/mL)    Comment 3            POCT I-STAT, CHEM 8     Status: Abnormal   Collection Time   05/08/11  2:21 AM      Component Value Range Comment   Sodium 132 (*) 135 - 145 (mEq/L)    Potassium 4.0  3.5 - 5.1 (mEq/L)    Chloride 98  96 - 112 (mEq/L)    BUN 16  6 - 23 (mg/dL)    Creatinine, Ser 1.61 (*) 0.50 - 1.10 (mg/dL)    Glucose, Bld 096 (*) 70 - 99 (mg/dL)    Calcium, Ion 0.45  1.12 - 1.32 (mmol/L)    TCO2 27  0 - 100 (mmol/L)    Hemoglobin 17.7 (*) 12.0 - 15.0 (g/dL)    HCT 40.9 (*) 81.1 - 46.0 (%)    Ct Head Wo Contrast  05/08/2011  *RADIOLOGY REPORT*  Clinical Data:  Code stroke, right side weakness  CT HEAD WITHOUT CONTRAST  Technique:  Contiguous axial images were obtained from the base of the skull through the vertex without contrast.  Comparison: None  Findings: Normal ventricular morphology. No midline shift or mass effect. Normal appearance of brain parenchyma. No intracranial hemorrhage, mass lesion or evidence of acute infarction. No extra-axial fluid collections. Atherosclerotic calcification within internal carotid arteries at skull base bilaterally. Visualized paranasal sinuses and mastoid air cells clear. Bones unremarkable.  IMPRESSION: No acute intracranial abnormalities.  These results were called by telephone on 05/08/2011  at  0230 hrs to  Dr. Rubin Payor, who verbally acknowledged these results.  Original Report Authenticated By: Lollie Marrow, M.D.    Assessment: 61 y.o. female presenting with nystagmus, dysarthria and right hemiparesis of acute onset.  Patient with multiple risk factors for stroke.  CT shows no evidence for bleed.  Patient outside time window for tPA.  Suspect posterior circulation ischemic event.  Further work up  indicated.    Stroke Risk Factors - diabetes mellitus, hyperlipidemia, hypertension and smoking  Plan: 1. HgbA1c, fasting lipid panel 2. MRI, MRA  of the brain without contrast 3. PT consult, OT consult, Speech consult 4. Echocardiogram 5. Carotid dopplers 6. Prophylactic therapy-Antiplatelet med: Aspirin - dose 325mg  daily 7. Risk factor modification 8. Telemetry monitoring   Thana Farr, MD [facility] [phone] 05/08/2011, 2:39 AM

## 2011-05-08 NOTE — Progress Notes (Signed)
Occupational Therapy Evaluation Patient Details Name: Chelsey Gallagher MRN: [id number] DOB: 1950/08/12 Today's Date: 05/08/2011  Problem List:  Patient Active Problem List  Diagnoses  . CVA (cerebral infarction)  . Hypertension  . Diabetes mellitus    Past Medical History:  Past Medical History  Diagnosis Date  . Diabetes mellitus   . Hypertension   . Depression   . PAD (peripheral artery disease)     S/p bypass grafting, unclear exactly where  . Nephrolithiasis    Past Surgical History:  Past Surgical History  Procedure Date  . Total hip arthroplasty     Right  . Abdominal hysterectomy   . Cholecystectomy 1984  . Appendectomy     OT Assessment/Plan/Recommendation OT Assessment Clinical Impression Statement: Pt admitted with nystagmus, dysarthria, and right hemiparesis of acute onset from right pontine infarct thus affecting PLOF. Will benefit from acute OT to address below problem list in order to maximize benefits of rehab at either CIR or SNF. OT Recommendation/Assessment: Patient will need skilled OT in the acute care venue OT Problem List: Decreased strength;Decreased range of motion;Decreased activity tolerance;Impaired balance (sitting and/or standing);Impaired vision/perception;Decreased cognition;Decreased safety awareness;Decreased knowledge of use of DME or AE;Impaired tone;Impaired sensation;Impaired UE functional use OT Therapy Diagnosis : Generalized weakness;Cognitive deficits;Disturbance of vision;Paresis OT Plan OT Frequency: Min 2X/week OT Treatment/Interventions: Self-care/ADL training;Therapeutic exercise;Neuromuscular education;DME and/or AE instruction;Therapeutic activities;Cognitive remediation/compensation;Patient/family education;Visual/perceptual remediation/compensation;Balance training OT Recommendation Recommendations for Other Services: Rehab consult Follow Up Recommendations: Skilled nursing facility;Inpatient Rehab Equipment Recommended:  Defer to next venue Individuals Consulted Consulted and Agree with Results and Recommendations: Patient OT Goals Acute Rehab OT Goals OT Goal Formulation: With patient Time For Goal Achievement: 2 weeks ADL Goals Pt Will Perform Grooming: with set-up;Sitting, edge of bed ADL Goal: Grooming - Progress: Goal set today Pt Will Perform Upper Body Bathing: with supervision;Sitting, edge of bed ADL Goal: Upper Body Bathing - Progress: Goal set today Pt Will Perform Lower Body Bathing: with min assist;Sitting, edge of bed ADL Goal: Lower Body Bathing - Progress: Goal set today Pt Will Transfer to Toilet: with mod assist;with DME;3-in-1;Stand pivot transfer ADL Goal: Toilet Transfer - Progress: Goal set today Miscellaneous OT Goals Miscellaneous OT Goal #1: Pt will perform dynamic sitting balance tasks ~5 min with no loss of balance and min tactile cueing in prep for EOB ADL activity. OT Goal: Miscellaneous Goal #1 - Progress: Goal set today  OT Evaluation Precautions/Restrictions  Precautions Precautions: Fall Required Braces or Orthoses: No Restrictions Weight Bearing Restrictions: No Prior Functioning Home Living Lives With: Alone Receives Help From: Family (intermittent assistance) Type of Home: House Home Layout: One level Home Access: Stairs to enter Entrance Stairs-Rails: None Entrance Stairs-Number of Steps: 1 (inch step in) Bathroom Shower/Tub: Walk-in shower;Door [employer] Toilet: Handicapped height Bathroom Accessibility: Yes How Accessible: Accessible via walker Home Adaptive Equipment: Walker - rolling;Bedside commode/3-in-1;Reacher Prior Function Level of Independence: Independent with basic ADLs;Independent with homemaking with ambulation;Independent with transfers;Independent with gait Able to Take Stairs?: Yes Driving: Yes Vocation: Retired Leisure: Hobbies-yes (Comment) Comments: reading, computer ADL ADL Toilet Transfer: Performed;+2 Total  assistance;Comment for patient % (30%) Toilet Transfer Details (indicate cue type and reason): assist to block Rt. knee and provide tactile cues to bil. posterior hips to increase upright posture. tranferred to Lt. side Toilet Transfer Method: Surveyor, minerals: Bedside commode Toileting - Clothing Manipulation: Performed;Moderate assistance Toileting - Clothing Manipulation Details (indicate cue type and reason): Mod assist to bring gown over hips. Where Assessed - Toileting  Clothing Manipulation: Sit to stand from 3-in-1 or toilet Toileting - Hygiene: Performed;Set up Toileting - Hygiene Details (indicate cue type and reason): setup to fold toilet paper  Where Assessed - Toileting Hygiene: Sit on 3-in-1 or toilet ADL Comments: Pt and family educated on proper positioning of R UE.  Pt also educated on using LUE to move RUE without pulling on Rt. digits. Pt required min-mod assist on Rt. side to promote midline orientation. Pt sat EOB ~5 minutes. Anticipate that pt will require min assist with grooming and feeding tasks due to decreased R UE functional use.  Pt will require mod-max assist for UB/LB ADLs.  Vision/Perception  Vision - History Baseline Vision: Wears glasses all the time (glasses not present during evaluation) Patient Visual Report: Blurring of vision (occasional blurring with both eyes open) Vision - Assessment Eye Alignment: Within Functional Limits Vision Assessment: Vision tested Tracking/Visual Pursuits: Decreased smoothness of horizontal tracking;Impaired - to be further tested in functional context Saccades: Decreased speed of saccadic movement Visual Fields: No apparent deficits Additional Comments: Pt with horizontal nystagmus during tracking.  Pt reports Rt. eye feels tired.  Will further assess change in vision compared to baseline. Cognition Cognition Arousal/Alertness: Awake/alert Overall Cognitive Status: Impaired Orientation Level: Oriented  X4 Safety/Judgement: Decreased safety judgement for tasks assessed Decreased Safety/Judgement: Impulsive Safety/Judgement - Other Comments: Pt very impulsive with movements and required constant cuing to slow down. Sensation/Coordination Sensation Light Touch: Impaired Detail Light Touch Impaired Details: Absent RUE Stereognosis: Impaired Detail Stereognosis Impaired Details: Absent RUE Proprioception: Impaired Detail Proprioception Impaired Details: Absent RUE Additional Comments: Pt reports that R UE feels like "pins and needles" Coordination Gross Motor Movements are Fluid and Coordinated: No (RUE) Fine Motor Movements are Fluid and Coordinated: No (RUE) Extremity Assessment RUE Assessment RUE Assessment: Exceptions to [facility] RUE AROM (degrees) Overall AROM Right Upper Extremity: Deficits (No AROM) RUE Strength RUE Overall Strength Comments: 0/5 RUE Tone RUE Tone Comments: flaccid LUE Assessment LUE Assessment: Within Functional Limits Mobility  Bed Mobility Bed Mobility: Yes Rolling Right: 3: Mod assist;With rail Rolling Right Details (indicate cue type and reason): assist to control movement and for safety due to pt's impulsive behavior Right Sidelying to Sit: 1: +2 Total assist;Patient percentage (comment);With rails (30%) Right Sidelying to Sit Details (indicate cue type and reason): assist to support trunk and shoulders OOB Sitting - Scoot to Edge of Bed: 3: Mod assist Sitting - Scoot to Edge of Bed Details (indicate cue type and reason): assist for safety due to impulsive behavior Transfers Transfers: Yes Sit to Stand: 1: +2 Total assist;Patient percentage (comment);From bed;From chair/3-in-1;With upper extremity assist;With armrests (50%) Sit to Stand Details (indicate cue type and reason): assist to block Rt. knee and support Rt. UE and manual cues provided throughout bil. hips to promote upright posture. VC for safe Lt. hand placement. Stand to Sit: 1: +2 Total  assist;Patient percentage (comment);To chair/3-in-1;With armrests (50%) Stand to Sit Details: assist to control descent and support Rt. UE. VC to for safe Lt. hand placement. Exercises   End of Session OT - End of Session Equipment Utilized During Treatment: Gait belt Activity Tolerance: Patient limited by fatigue Patient left: in chair;with call bell in reach;with family/visitor present Nurse Communication: Mobility status for transfers General Behavior During Session: Other (comment) (impulsive) Cognition: Impaired Cognitive Impairment: decreased safety awareness  5:12 PM  05/08/2011 Cipriano Mile OTR/L Pager [phone] Office [phone]

## 2011-05-08 NOTE — H&P (Signed)
PCP:   DAVIS,JOHN F, MD, MD  Confirmed with pt   Chief Complaint:  Right sided weakness  HPI: 60yoF with h/o HTN, diabetes, current tobacco abuse, restless legs syndrome, presents with  possible CVA.   Pt is moderately good historian, states last night she "didn't feel right" with symptoms of  dyspepsia and nausea, went to bed around 10p and woke up in the night with some right arm and  right leg numbness, that progressed to weakness of her right side. She called her daughter for  help. There was associated slurred speech and possible facial droop, and they also note decreased  hearing on the right side. EMS initiated code stroke.   Vitals in the ED with HTN up to 207/102. Labs with hypoNa 130, normal renal, LFT's, cardiac  enzymes, CBC, INR, Trop POC. CT head with nothing acute, although there is calcification of ICA's  at skull bases bilaterally. Neuro saw the patient and felt a posterior circulation ischemic event  was likely, but outside window for tPA and have given their recs, and admit to medicine.   ROS as above, otherwise pt is having restless legs that is baseline for her. No SOB, chest pain,  or current GI symptoms, ROS o/w negative.   Past Medical History  Diagnosis Date  . Diabetes mellitus   . Hypertension   . Depression   . PAD (peripheral artery disease)     S/p bypass grafting, unclear exactly where  . Nephrolithiasis     Past Surgical History  Procedure Date  . Total hip arthroplasty     Right  . Abdominal hysterectomy   . Cholecystectomy 1984  . Appendectomy     Medications:  HOME MEDS: Reconciled by name with the pt  Prior to Admission medications   Medication Sig Start Date End Date Taking? Authorizing Provider  citalopram (CELEXA) 20 MG tablet Take 20 mg by mouth daily.   Yes Historical Provider, MD  dicyclomine (BENTYL) 10 MG capsule Take 10 mg by mouth 4 (four) times daily -  before meals and at bedtime.   Yes Historical Provider, MD    gabapentin (NEURONTIN) 600 MG tablet Take 300-600 mg by mouth 2 (two) times daily. 300mg  at supper and 600mg  at bedtime   Yes Historical Provider, MD  glyBURIDE micronized (GLYNASE) 3 MG tablet Take 6 mg by mouth 2 (two) times daily before a meal.   Yes Historical Provider, MD  HYDROcodone-acetaminophen (NORCO) 7.5-325 MG per tablet Take 0.5-1 tablets by mouth every 4 (four) hours as needed. For pain   Yes Historical Provider, MD  lisinopril (PRINIVIL,ZESTRIL) 20 MG tablet Take 20 mg by mouth daily.   Yes Historical Provider, MD  metFORMIN (GLUCOPHAGE) 500 MG tablet Take 1,000-1,500 mg by mouth 2 (two) times daily with a meal. 1500 mg every morning and 1000 mg every evening   Yes Historical Provider, MD  pravastatin (PRAVACHOL) 40 MG tablet Take 80 mg by mouth at bedtime.   Yes Historical Provider, MD  rOPINIRole (REQUIP) 3 MG tablet Take 3 mg by mouth at bedtime.   Yes Historical Provider, MD    Allergies:  Allergies  Allergen Reactions  . Codeine   . Flexeril (Cyclobenzaprine Hcl)     Social History:   reports that she has been smoking Cigarettes.  She has a 45 pack-year smoking history. She has never used smokeless tobacco. She reports that she does not drink alcohol or use illicit drugs.  Family History: History reviewed. No pertinent family history.  Physical Exam: Filed Vitals:   05/08/11 0345 05/08/11 0415 05/08/11 0500 05/08/11 0535  BP: 137/92 207/102 172/93 191/99  Pulse: 88 92 87 92  Temp:      TempSrc:      Resp: 19 16 16 20   SpO2: 93% 95% 96% 91%   Blood pressure 191/99, pulse 92, temperature 97.8 F (36.6 C), temperature source Oral, resp. rate 20, SpO2 91.00%. Gen: Older than stated age appearing F in ED stretcher sitting forward, occasionally with legs  jumping around bilaterally. Able to relate history moderately well.  HEENT: Pupils round and reactive but right eye appears more bulging outwards, with right eye  exotropia, and roving movements of her right  eye/nystagmus. Mouth dry appearing.  Lungs: Poor to fair air movement, with very light, subtle crackle at base, but minimal.  Heart: Regular, not tachycardic, no m/g appreciated, overall normal Abd: Soft NT ND benign exam Extrem: Warm, thin with decreased bulk and normal tone in LUE but not RUE which seems quite  flaccid.  Neuro: Alert, conversant and attentive. Speech is slurred and not clear but minimally so. Face  seems a bit symmetric with possible minor R droop but not grossly. Eyes as above. Tongue midline  and moves fairly well. Cannot shrug her right shoulder, or move any part of her RUE at all, R hand  grip is very weak. LUE totally normal. She has occasional jumpy movement of her bilateral LE"s,  however when asked to move her RLE, she cannot flex her hip, but can press down on gas pedal. LLE  is normal exam.    Labs & Imaging Results for orders placed during the hospital encounter of 05/08/11 (from the past 48 hour(s))  PROTIME-INR     Status: Normal   Collection Time   05/08/11  2:19 AM      Component Value Range Comment   Prothrombin Time 12.5  11.6 - 15.2 (seconds)    INR 0.91  0.00 - 1.49    APTT     Status: Normal   Collection Time   05/08/11  2:19 AM      Component Value Range Comment   aPTT 28  24 - 37 (seconds)   CBC     Status: Abnormal   Collection Time   05/08/11  2:19 AM      Component Value Range Comment   WBC 7.0  4.0 - 10.5 (K/uL)    RBC 5.53 (*) 3.87 - 5.11 (MIL/uL)    Hemoglobin 17.7 (*) 12.0 - 15.0 (g/dL)    HCT 78.2 (*) 95.6 - 46.0 (%)    MCV 89.5  78.0 - 100.0 (fL)    MCH 32.0  26.0 - 34.0 (pg)    MCHC 35.8  30.0 - 36.0 (g/dL)    RDW 21.3  08.6 - 57.8 (%)    Platelets 141 (*) 150 - 400 (K/uL)   DIFFERENTIAL     Status: Normal   Collection Time   05/08/11  2:19 AM      Component Value Range Comment   Neutrophils Relative 76  43 - 77 (%)    Neutro Abs 5.3  1.7 - 7.7 (K/uL)    Lymphocytes Relative 18  12 - 46 (%)    Lymphs Abs 1.2  0.7 - 4.0 (K/uL)      Monocytes Relative 6  3 - 12 (%)    Monocytes Absolute 0.4  0.1 - 1.0 (K/uL)    Eosinophils Relative 0  0 - 5 (%)  Eosinophils Absolute 0.0  0.0 - 0.7 (K/uL)    Basophils Relative 0  0 - 1 (%)    Basophils Absolute 0.0  0.0 - 0.1 (K/uL)   COMPREHENSIVE METABOLIC PANEL     Status: Abnormal   Collection Time   05/08/11  2:19 AM      Component Value Range Comment   Sodium 130 (*) 135 - 145 (mEq/L)    Potassium 4.1  3.5 - 5.1 (mEq/L)    Chloride 92 (*) 96 - 112 (mEq/L)    CO2 26  19 - 32 (mEq/L)    Glucose, Bld 309 (*) 70 - 99 (mg/dL)    BUN 14  6 - 23 (mg/dL)    Creatinine, Ser 9.60 (*) 0.50 - 1.10 (mg/dL)    Calcium 45.4  8.4 - 10.5 (mg/dL)    Total Protein 7.6  6.0 - 8.3 (g/dL)    Albumin 3.7  3.5 - 5.2 (g/dL)    AST 19  0 - 37 (U/L) HEMOLYSIS AT THIS LEVEL MAY AFFECT RESULT   ALT 21  0 - 35 (U/L)    Alkaline Phosphatase 104  39 - 117 (U/L)    Total Bilirubin 0.5  0.3 - 1.2 (mg/dL)    GFR calc non Af Amer >90  >90 (mL/min)    GFR calc Af Amer >90  >90 (mL/min)   CK TOTAL AND CKMB     Status: Normal   Collection Time   05/08/11  2:19 AM      Component Value Range Comment   Total CK 50  7 - 177 (U/L)    CK, MB 2.2  0.3 - 4.0 (ng/mL)    Relative Index RELATIVE INDEX IS INVALID  0.0 - 2.5    TROPONIN I     Status: Normal   Collection Time   05/08/11  2:19 AM      Component Value Range Comment   Troponin I <0.30  <0.30 (ng/mL)   POCT I-STAT TROPONIN I     Status: Normal   Collection Time   05/08/11  2:19 AM      Component Value Range Comment   Troponin i, poc 0.01  0.00 - 0.08 (ng/mL)    Comment 3            POCT I-STAT, CHEM 8     Status: Abnormal   Collection Time   05/08/11  2:21 AM      Component Value Range Comment   Sodium 132 (*) 135 - 145 (mEq/L)    Potassium 4.0  3.5 - 5.1 (mEq/L)    Chloride 98  96 - 112 (mEq/L)    BUN 16  6 - 23 (mg/dL)    Creatinine, Ser 0.98 (*) 0.50 - 1.10 (mg/dL)    Glucose, Bld 119 (*) 70 - 99 (mg/dL)    Calcium, Ion 1.47  1.12 - 1.32  (mmol/L)    TCO2 27  0 - 100 (mmol/L)    Hemoglobin 17.7 (*) 12.0 - 15.0 (g/dL)    HCT 82.9 (*) 56.2 - 46.0 (%)   GLUCOSE, CAPILLARY     Status: Abnormal   Collection Time   05/08/11  2:51 AM      Component Value Range Comment   Glucose-Capillary 300 (*) 70 - 99 (mg/dL)    Ct Head Wo Contrast  05/08/2011  *RADIOLOGY REPORT*  Clinical Data:  Code stroke, right side weakness  CT HEAD WITHOUT CONTRAST  Technique:  Contiguous axial images were obtained from the  base of the skull through the vertex without contrast.  Comparison: None  Findings: Normal ventricular morphology. No midline shift or mass effect. Normal appearance of brain parenchyma. No intracranial hemorrhage, mass lesion or evidence of acute infarction. No extra-axial fluid collections. Atherosclerotic calcification within internal carotid arteries at skull base bilaterally. Visualized paranasal sinuses and mastoid air cells clear. Bones unremarkable.  IMPRESSION: No acute intracranial abnormalities.  These results were called by telephone on 05/08/2011  at  0230 hrs to  Dr. Rubin Payor, who verbally acknowledged these results.  Original Report Authenticated By: Lollie Marrow, M.D.    ECG: NSR 81 bpm, normal axis, normal P waves, narrow QRS, No Q waves, LVH in aVF. No St  deviations, diffusely inverted TW.   Impression Present on Admission:  .CVA (cerebral infarction) .Hypertension .Diabetes mellitus  60yoF with h/o HTN, diabetes, current tobacco abuse, restless legs syndrome, presents with  possible CVA.   1. Stroke: She has dense RUE and RLE paresis on my exam, however interesting has jumpy movements  of BLE's attributed to restless legs. Her R eye is bulging out a bit more, has exotropia, and has  almost rhythmic roving movements. Her speech is not normal either, and they state her hearing is  decreased too.   - Admit stroke protocol. Follow up neuro recs, which are appreciated.  - BP control, currently running quite  hypertense. Continue home lisinopril, will add IV labetalol  for SBP > 180 or DBP > 100  2. Diabetes, current hyperglycemia: holding home orals, will give SSI.   3. Restless legs syndrome: Continue home ropinorole and gabapentin. Will check iron studies as pt  denies these have been checked.   Telemetry, MC team 4 Presumed full code   Other plans as per orders.  Arella Blinder 05/08/2011, 6:04 AM

## 2011-05-09 ENCOUNTER — Inpatient Hospital Stay (HOSPITAL_COMMUNITY): Payer: Medicare Other

## 2011-05-09 ENCOUNTER — Encounter (HOSPITAL_COMMUNITY): Payer: Self-pay | Admitting: Radiology

## 2011-05-09 DIAGNOSIS — I6330 Cerebral infarction due to thrombosis of unspecified cerebral artery: Secondary | ICD-10-CM

## 2011-05-09 LAB — BASIC METABOLIC PANEL
CO2: 26 mEq/L (ref 19–32)
Chloride: 97 mEq/L (ref 96–112)
Creatinine, Ser: 0.48 mg/dL — ABNORMAL LOW (ref 0.50–1.10)
Potassium: 3.8 mEq/L (ref 3.5–5.1)

## 2011-05-09 LAB — CBC
MCV: 91.6 fL (ref 78.0–100.0)
Platelets: 155 10*3/uL (ref 150–400)
RBC: 5.22 MIL/uL — ABNORMAL HIGH (ref 3.87–5.11)
WBC: 8.6 10*3/uL (ref 4.0–10.5)

## 2011-05-09 LAB — GLUCOSE, CAPILLARY
Glucose-Capillary: 231 mg/dL — ABNORMAL HIGH (ref 70–99)
Glucose-Capillary: 239 mg/dL — ABNORMAL HIGH (ref 70–99)
Glucose-Capillary: 155 mg/dL — ABNORMAL HIGH (ref 70–99)
Glucose-Capillary: 148 mg/dL — ABNORMAL HIGH (ref 70–99)

## 2011-05-09 MED ORDER — IOHEXOL 350 MG/ML SOLN
50.0000 mL | Freq: Once | INTRAVENOUS | Status: AC | PRN
  Administered 2011-05-09: 50 mL via INTRAVENOUS

## 2011-05-09 MED ORDER — METHOCARBAMOL 500 MG PO TABS
500.0000 mg | ORAL_TABLET | Freq: Three times a day (TID) | ORAL | Status: DC | PRN
  Administered 2011-05-09 – 2011-05-13 (×5): 500 mg via ORAL
  Filled 2011-05-09 (×5): qty 1

## 2011-05-09 NOTE — Evaluation (Signed)
Speech Language Pathology Evaluation Patient Details Name: Chelsey Gallagher MRN: [id number] DOB: [birth year] Today's Date: 05/09/2011  HPI: 61 yr old admitted with right side weakness.  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  Problem List:  Patient Active Problem List  Diagnoses  . CVA (cerebral infarction)  . Hypertension  . Diabetes mellitus   Past Medical History:  Past Medical History  Diagnosis Date  . Diabetes mellitus   . Hypertension   . Depression   . PAD (peripheral artery disease)     S/p bypass grafting, unclear exactly where  . Nephrolithiasis    Past Surgical History:  Past Surgical History  Procedure Date  . Total hip arthroplasty     Right  . Abdominal hysterectomy   . Cholecystectomy 1984  . Appendectomy     SLP Assessment/Plan/Recommendation Assessment Clinical Impression Statement: Pt. exhibits mild-moderate dysarthria primarily at the sentence/conversational level.  Cognitive ability for moderate level verbal information appeared WFL's.  Pt. may have difficulty when presented with performance of more complex tasks/activities.  Pt. would benefit from ST to facilitate intelligibility and assist with ADL's with the least assistance provided. SLP Recommendation/Assessment: Patient will need skilled Speech Lanaguage Pathology Services in the acute care venue to address identified deficits Problem List:  (intelligibility) Therapy Diagnosis: Dysarthria Plan Speech Therapy Frequency: min 2x/week Duration: 2 weeks Treatment/Interventions: Environmental controls;Functional tasks;SLP instruction and feedback;Compensatory strategies;Patient/family education Potential to Achieve Goals: Good SLP Recommendations Recommendations for Other Services: Rehab consult Follow up Recommendations:  (TBD) Equipment Recommended: Defer to next venue Individuals Consulted Consulted and Agree with Results and Recommendations: Patient;Family member/caregiver  SLP  Goals  SLP Goals Potential to Achieve Goals: Good Progress/Goals/Alternative treatment plan discussed with pt/caregiver and they: Agree SLP Goal #1: Pt. will demonstrate speech strategies in conversation given mild verbal cues SLP Goal #2: Pt. will exhibit functional problem solving abilities for moderately complex level information with mild question cues.  SLP Evaluation Prior Functioning Cognitive/Linguistic Baseline: Within functional limits Type of Home: House Lives With: Alone Receives Help From: Family Vocation: Retired  IT consultant Overall Cognitive Status: Appears within functional limits for tasks assessed [facility] for basic, need to assess higher level cognition) Arousal/Alertness: Awake/alert Orientation Level: Oriented X4 Attention: Sustained Memory: Impaired Memory Impairment: Decreased recall of new information Awareness:  (INTELLECTUAL AND EMERGENT WFL'S, NEED TO ASSESS ANTICIPATORY) Problem Solving: Appears intact (INTACT FOR MODERATE LEVEL VERBAL PROB SOLV) Safety/Judgment: Appears intact  Comprehension Auditory Comprehension Overall Auditory Comprehension: Appears within functional limits for tasks assessed Visual Recognition/Discrimination Discrimination: Not tested Reading Comprehension Reading Status: Not tested  Expression Expression Primary Mode of Expression: Verbal Verbal Expression Overall Verbal Expression: Appears within functional limits for tasks assessed Pragmatics: No impairment Non-Verbal Means of Communication: Not applicable Written Expression Written Expression: Not tested  Oral/Motor Oral Motor/Sensory Function Overall Oral Motor/Sensory Function: Impaired Labial ROM: Reduced right Labial Symmetry: Abnormal symmetry right Labial Strength: Reduced Labial Sensation: Reduced Lingual ROM: Reduced left Lingual Symmetry: Within Functional Limits Lingual Strength: Reduced Lingual Sensation: Reduced Facial ROM: Reduced right Facial  Symmetry: Within Functional Limits Facial Strength: Reduced Velum:  (decreased) Mandible: Within Functional Limits Motor Speech Overall Motor Speech: Appears within functional limits for tasks assessed Resonance:  (QUESTIONABLE ABNORMAL?) Articulation: Impaired Level of Impairment: Conversation Intelligibility: Intelligibility reduced Word: 75-100% accurate Phrase: 75-100% accurate Sentence: 75-100% accurate Conversation: 75-100% accurate Motor Planning: Witnin functional limits Effective Techniques: Over-articulate  Breck Coons Yolani Vo M.Ed [employer] [phone]  05/09/2011

## 2011-05-09 NOTE — Progress Notes (Signed)
VASCULAR LAB PRELIMINARY  PRELIMINARY  PRELIMINARY  PRELIMINARY  Carotid duplex  completed.    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  Left:  The internal carotid appears to be fed by a retrograde branch of the external carotid.  Bilateral:  Vertebral artery flow is antegrade.   The left vertebral waveform becomes spiked in the distal portion consistent with a more distal obstruction.  Terance Hart, RVT 05/09/2011, 6:22 PM

## 2011-05-09 NOTE — Progress Notes (Signed)
Speech Language/Pathology  Speech Pathology: Dysphagia Treatment Note  Patient was observed with :  Thin liquids.  Patient was noted to have s/s of aspiration : No:    Lung Sounds:  Diminished, clear Temperature: 98.4  Patient required: min verbal cues to consistently follow precautions/strategies  Clinical Impression:  Pt. And family reported cough x 1 with dinner or breakfast (?) but no other s/s aspiration reported.  She needed min verbal cues to take small sips of thin via cup.  Observed with straw sips as well without difficulty.   Recommendations: Continue Dys 3 and thin liquids (straws ok).  ST will continue to work with pt. For appropriateness for diet texture upgrade.   Pain:   none Intervention Required:   No   Goals: No Goals Met  Chelsey Gallagher Chamizal M.Ed [employer] [phone]  05/09/2011

## 2011-05-09 NOTE — Progress Notes (Signed)
Physical Therapy Treatment Patient Details Name: Piya Mesch MRN: [id number] DOB: June 08, 1950 Today's Date: 05/09/2011  PT Assessment/Plan  PT - Assessment/Plan Comments on Treatment Session: Pt has made great improvements in the first day since evaluation. Pt has decreased her impulsivity and increased safety awareness. Pt has had improvements in her LE strength although increased flexor tone. Continue with balance training and pt education prior to d/c to CIR. PT Plan: Discharge plan remains appropriate;Frequency remains appropriate PT Frequency: Min 4X/week Recommendations for Other Services: Rehab consult Follow Up Recommendations: Inpatient Rehab;Supervision/Assistance - 24 hour Equipment Recommended: Defer to next venue PT Goals  Acute Rehab PT Goals PT Goal Formulation: With patient/family PT Goal: Supine/Side to Sit - Progress: Progressing toward goal PT Goal: Sit at Edge Of Bed - Progress: Progressing toward goal PT Goal: Sit to Supine/Side - Progress: Progressing toward goal PT Goal: Sit to Stand - Progress: Progressing toward goal PT Goal: Stand to Sit - Progress: Progressing toward goal PT Goal: Stand - Progress: Progressing toward goal PT Goal: Perform Home Exercise Program - Progress: Progressing toward goal  PT Treatment Precautions/Restrictions  Precautions Precautions: Fall Required Braces or Orthoses: No Restrictions Weight Bearing Restrictions: No Mobility (including Balance) Bed Mobility Bed Mobility: Yes Rolling Left: 5: Supervision;With rail Rolling Left Details (indicate cue type and reason): VC for hand placement and sequencing. No physical assist needed Left Sidelying to Sit: 5: Supervision;With rails;HOB elevated (comment degrees) Left Sidelying to Sit Details (indicate cue type and reason): VC for sequencing and to decrease speed for a more fluid movement. Sitting - Scoot to Edge of Bed: 5: Supervision Sitting - Scoot to Edge of Bed Details  (indicate cue type and reason): VC for weight shifting. Pt required increased time for initiation and for motor planning although she was able to complete without physical assist.  Transfers Transfers: Yes Sit to Stand: 1: +2 Total assist;Patient percentage (comment);From bed;From chair/3-in-1;With armrests;With upper extremity assist (60%) Sit to Stand Details (indicate cue type and reason): assist to block Rt knee to prevent buckling and throughout bil. posterior hips to facilitate anterior weight shift and maintain midline orientation. support given to RUE to prevent hanging at side and help prevent subluxation. Stand to Sit: 3: Mod assist;To chair/3-in-1;With armrests;With upper extremity assist Stand to Sit Details: VC throughout for sequencing and control, especially on decent. Pt required increased cueign to place hands on chair to assist with descent. Stand Pivot Transfers: 1: +2 Total assist;Patient percentage (comment) (50%) Stand Pivot Transfer Details (indicate cue type and reason):  Transferred from bed to chair. Assist to block Rt. knee to prevent buckling. Assist x 1 tosupport to Rt. UE to prevent it from hanging at side. Manual cues given throughout bil. posterior hips to facilitate anterior pelvic tilt and midline orientation for safety during transfer. Pt able to flex R hip with cueing and minimal tactile cues.   Ambulation/Gait Ambulation/Gait: No  Dynamic Sitting Balance Dynamic Sitting - Balance Support: Right upper extremity supported;Feet supported (unable to maintain full support with RUE secondary to PFtone) Dynamic Sitting - Level of Assistance: 3: Mod assist Dynamic Sitting Balance - Compensations: Mod assist with trunk control throughout balance activities(bending down to ground, reaching forward, reaching across midline, and reaching laterally). Pt able to correct herself back into midline after each reaching activity with minimal cueing.  Dynamic Sitting - Balance  Activities: Forward lean/weight shifting;Reaching across midline Static Standing Balance Static Standing - Balance Support: Bilateral upper extremity supported (decreased RUE, tactile and manual support to  maintain WB) Static Standing - Level of Assistance: 1: +2 Total assist;Patient percentage (comment) (30%) Static Standing - Comment/# of Minutes: Manual facilitation at trunk as well as at R knee to prevent buckling. Cues to prevent right lateral lean. Assist of another to maintain weight bearing on RUE at sink. Pt stood at sink for around 5 minutes Exercise  Other Exercises Other Exercises: dorsiflexor strength with manual facilitation through the knee(extension) to prevent PF tone. Husband educated on proper way of stretching as well as pt with LLE. End of Session PT - End of Session Equipment Utilized During Treatment: Gait belt Activity Tolerance: Patient tolerated treatment well Patient left: in chair;with call bell in reach;with family/visitor present Nurse Communication: Mobility status for transfers General Behavior During Session: [facility] for tasks performed Cognition: Impaired Cognitive Impairment: slightly impulsive and decreased safety awareness (improved since previous session)  Chelsey Gallagher 05/09/2011, 4:59 PM  05/09/2011 Chelsey Gallagher DPT PAGER: [phone] OFFICE: [phone]

## 2011-05-09 NOTE — PMR Pre-admission (Addendum)
PMR Admission Coordinator Pre-Admission Assessment  Patient:  Chelsey Gallagher is an 61 y.o., female MRN:  [id number] DOB:  03-18-1950 Height:  5\' 3"  (160 cm) Weight:  67.087 kg (147 lb 14.4 oz)  Insurance Information: HMO:     PPO:yes     PCP:      IPA:      80/20:      OTHER:Enhanced PPO policy PRIMARY:Blue Medicare      Policy#:[id number]      Subscriber:Namira Lapinski CM Name:Jimmie Kathryne Hitch      Phone#:[phone]     QQV#:[phone] Pre-Cert#:                                                   Employer:  Benefits:  Phone #:[phone]     Name:Wilfred H. Eff. Date:03/04/11     Deduct:0  Out of Pocket Max:$3400(met $128)   Life CZY:[id number] CIR:w/precert $170 1-6 days      SNF:w/precert $0 1-10 days, $50/d 11-100 days Outpatient:80%     Co-Pay:20% Home Health:w/precert 100%      Co-Pay:none DME:80%     Co-Pay:20% Providers:in network  Current Medical History:   Patient Admitting Diagnosis: L posterior circulation CVA   History of Present Illness: Admitted 03/07 with right sided weakness and mild slurred speech.  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  Speech therapy placed on dysphagia 3, thin liquids diet.  Has a history of DM with hemoglobin A1C of 10.2 currently on sliding scale insulin.  Carotid dopplers done 03/08.  CIR admit was planned for 03/09, but was deferred due to need for further evaluation.  Cerebral arteriogram done 3/11.  CT chest/abd/pelvis done.  Need to follow-up with CT chest in 3 months.  Biopsy of mandibular mass to scheduled for later today, 03/13.  Patient is participating with therapies and making progress.   Update:  Biopsy is complete 03/13 and can plan for inpatient rehab admission today 05/14/11.   Patients Past Medical History:   Past Medical History  Diagnosis Date  . Diabetes mellitus   . Hypertension   . Depression   . PAD (peripheral artery disease)     S/p  bypass grafting, unclear exactly where  . Nephrolithiasis    Family Medical History:  family history is not on file. NIH Stroke scale: Total: 10  Patients Current Diet: Dysphagia  Prior Rehab/Hospitalizations: Had outpatient therapy at [facility] after R THR.  Current Medications: Current facility-administered medications:acetaminophen (TYLENOL) suppository 650 mg, 650 mg, Rectal, Q4H PRN, Devonne Doughty, MD;  acetaminophen (TYLENOL) tablet 650 mg, 650 mg, Oral, Q4H PRN, Devonne Doughty, MD;  aspirin chewable tablet 81 mg, 81 mg, Oral, Daily, Dorothea Ogle, MD, 81 mg at 05/13/11 1005;  atorvastatin (LIPITOR) tablet 40 mg, 40 mg, Oral, q1800, Tara C Jernejcic, PA, 40 mg at 05/13/11 1712 citalopram (CELEXA) tablet 20 mg, 20 mg, Oral, Daily, Devonne Doughty, MD, 20 mg at 05/13/11 1005;  enoxaparin (LOVENOX) injection 40 mg, 40 mg, Subcutaneous, Q24H, Devonne Doughty, MD, 40 mg at 05/13/11 1005;  feeding supplement (GLUCERNA SHAKE) liquid 237 mL, 237 mL, Oral, BID BM, Heather Cornelison Pitts, RD, 237 mL at 05/13/11 1500;  gabapentin (NEURONTIN) tablet 600 mg, 600 mg, Oral, QHS, Devonne Doughty, MD, 600 mg at 05/13/11 2217 insulin  aspart (novoLOG) injection 0-15 Units, 0-15 Units, Subcutaneous, TID WC, Devonne Doughty, MD, 3 Units at 05/14/11 [phone];  insulin glargine (LANTUS) injection 15 Units, 15 Units, Subcutaneous, QHS, Dorothea Ogle, MD, 15 Units at 05/13/11 2228;  labetalol (NORMODYNE,TRANDATE) injection 10 mg, 10 mg, Intravenous, Q2H PRN, Devonne Doughty, MD, 10 mg at 05/11/11 1906 lisinopril (PRINIVIL,ZESTRIL) tablet 40 mg, 40 mg, Oral, Daily, Dorothea Ogle, MD, 40 mg at 05/13/11 1004;  methocarbamol (ROBAXIN) tablet 500 mg, 500 mg, Oral, Q8H PRN, Dorothea Ogle, MD, 500 mg at 05/13/11 2218;  nicotine (NICODERM CQ - dosed in mg/24 hours) patch 21 mg, 21 mg, Transdermal, Daily, Dorothea Ogle, MD, 21 mg at 05/13/11 1005;  ondansetron (ZOFRAN) injection 4 mg, 4 mg, Intravenous, Q6H PRN, Devonne Doughty, MD, 4 mg at 05/10/11  1611 rOPINIRole (REQUIP) tablet 3 mg, 3 mg, Oral, QHS, Devonne Doughty, MD, 3 mg at 05/13/11 2217;  senna-docusate (Senokot-S) tablet 1 tablet, 1 tablet, Oral, QHS PRN, Devonne Doughty, MD;  zolpidem (AMBIEN) tablet 5 mg, 5 mg, Oral, QHS, Dorothea Ogle, MD, 5 mg at 05/13/11 2218;  DISCONTD: feeding supplement (GLUCERNA SHAKE) liquid 237 mL, 237 mL, Oral, TID WC, Ailene Ards, RD, 237 mL at 05/13/11 1150  Additional Precautions/Restrictions: Precautions Precautions: Fall Required Braces or Orthoses: No Restrictions Weight Bearing Restrictions: No  Therapy Assessments Physical Therapy: Precautions Precautions: Fall Required Braces or Orthoses: No Home Living Lives With: Alone Receives Help From: Family Type of Home: House Home Layout: One level Home Access: Stairs to enter Entrance Stairs-Rails: None Entrance Stairs-Number of Steps: 1 (inch step in) Bathroom Shower/Tub: Walk-in shower;Door [employer] Toilet: Handicapped height Bathroom Accessibility: Yes How Accessible: Accessible via walker Home Adaptive Equipment: Walker - rolling;Bedside commode/3-in-1;Reacher Prior Function Level of Independence: Independent with basic ADLs;Independent with homemaking with ambulation;Independent with transfers;Independent with gait Able to Take Stairs?: Yes Driving: Yes Vocation: Retired Leisure: Hobbies-yes (Comment) Comments: reading, computer Coordination Gross Motor Movements are Fluid and Coordinated: No (RUE) Fine Motor Movements are Fluid and Coordinated: No (RUE) Heel Shin Test: unable to complete secondary to weakness  Occupational Therapy: Precautions Precautions: Fall Required Braces or Orthoses: No Home Living Lives With: Alone Receives Help From: Family Type of Home: House Home Layout: One level Home Access: Stairs to enter Entrance Stairs-Rails: None Entrance Stairs-Number of Steps: 1 (inch step in) Bathroom Shower/Tub: Walk-in shower;Door [employer] Toilet:  Handicapped height Bathroom Accessibility: Yes How Accessible: Accessible via walker Home Adaptive Equipment: Walker - rolling;Bedside commode/3-in-1;Reacher Prior Function Level of Independence: Independent with basic ADLs;Independent with homemaking with ambulation;Independent with transfers;Independent with gait Able to Take Stairs?: Yes Driving: Yes Vocation: Retired Leisure: Hobbies-yes (Comment) Comments: reading, computer Coordination Gross Motor Movements are Fluid and Coordinated: No (RUE) Fine Motor Movements are Fluid and Coordinated: No (RUE) Heel Shin Test: unable to complete secondary to weakness Restrictions Weight Bearing Restrictions: No ADL Upper Body Dressing: Performed;Minimal assistance Upper Body Dressing Details (indicate cue type and reason): Pt donned gown with min assist for stitting balance and to thread RUE through sleeve. Where Assessed - Upper Body Dressing: Sitting, bed;Supported Toilet Transfer: Simulated;+2 Total assistance;Comment for patient % (40%) Toilet Transfer Details (indicate cue type and reason): assist to block Rt. knee to prevent buckling. support to Rt. UE to prevent it from hanging at side.  max manual cues given throughout bil. posterior hips to facilitate anterior pelvic tilt and midline orientation.  transferred from bed to chair. Toilet Transfer Method: Forensic scientist  Equipment: Other (comment) (chair) Toileting - Clothing Manipulation: Performed;Moderate assistance Toileting - Clothing Manipulation Details (indicate cue type and reason): Mod assist to bring gown over hips. Where Assessed - Toileting Clothing Manipulation: Sit to stand from 3-in-1 or toilet Toileting - Hygiene: Performed;Set up Toileting - Hygiene Details (indicate cue type and reason): setup to fold toilet paper  Where Assessed - Toileting Hygiene: Sit on 3-in-1 or toilet ADL Comments: Pt sat EOB to perform dynamic sitting balance tasks with mod assist  given to bil. posterior hips and Rt anterior tibialis due to increase R LE tone.  Pt performed static/dynamic standing balance tasks at sink with chair behind pt.  Pt required +2total pt 40-50% standing at sink to provide manual assist to Rt. knee and bil posterior hips to facilitate midline orientation and promote anterior pelvic tilt.  Max VC to use mirror as enviromental cue to maintain midline orientation.  Mod assist to Rt. UE for WB on sink with tactile cues given to triceps.   SLP Recommendations: Recommendations for Other Services: Rehab consult Follow up Recommendations:  (TBD) Equipment Recommended: Defer to next venue Solid Consistency: Dysphagia 3 (Mechanical soft) Liquid Consistency: Thin Liquid Administration via: Cup;No straw Medication Administration: Whole meds with liquid Supervision: Full supervision/cueing for compensatory strategies;Patient able to self feed Compensations: Slow rate;Small sips/bites;Check for pocketing Postural Changes and/or Swallow Maneuvers: Seated upright 90 degrees Oral Care Recommendations: Oral care BID Recommendations for Other Services: Rehab consult Follow up Recommendations:  (TBD)  Prior Function: Level of Independence: Independent with basic ADLs;Independent with homemaking with ambulation;Independent with transfers;Independent with gait Able to Take Stairs?: Yes Driving: Yes Vocation: Retired Leisure: Hobbies-yes (Comment) Comments: reading, computer ADL Upper Body Dressing: Performed;Minimal assistance Upper Body Dressing Details (indicate cue type and reason): Pt donned gown with min assist for stitting balance and to thread RUE through sleeve. Where Assessed - Upper Body Dressing: Sitting, bed;Supported Toilet Transfer: Simulated;+2 Total assistance;Comment for patient % (40%) Toilet Transfer Details (indicate cue type and reason): assist to block Rt. knee to prevent buckling. support to Rt. UE to prevent it from hanging at side.   max manual cues given throughout bil. posterior hips to facilitate anterior pelvic tilt and midline orientation.  transferred from bed to chair. Toilet Transfer Method: Surveyor, minerals: Other (comment) (chair) Toileting - Clothing Manipulation: Performed;Moderate assistance Toileting - Clothing Manipulation Details (indicate cue type and reason): Mod assist to bring gown over hips. Where Assessed - Toileting Clothing Manipulation: Sit to stand from 3-in-1 or toilet Toileting - Hygiene: Performed;Set up Toileting - Hygiene Details (indicate cue type and reason): setup to fold toilet paper  Where Assessed - Toileting Hygiene: Sit on 3-in-1 or toilet ADL Comments: Pt sat EOB to perform dynamic sitting balance tasks with mod assist given to bil. posterior hips and Rt anterior tibialis due to increase R LE tone.  Pt performed static/dynamic standing balance tasks at sink with chair behind pt.  Pt required +2total pt 40-50% standing at sink to provide manual assist to Rt. knee and bil posterior hips to facilitate midline orientation and promote anterior pelvic tilt.  Max VC to use mirror as enviromental cue to maintain midline orientation.  Mod assist to Rt. UE for WB on sink with tactile cues given to triceps.   Additional Prior Functional Levels:  Bed Mobility: I Transfers: I Mobility - Walk/Wheelchair: I Upper Body Dressing: I Lower Body Dressing: I Grooming: I Eating/Drinking: I Toilet Transfer: I Bladder Continence: WNL Bowel Management: constipation Stair  Climbing: I Communication: WNL Memory: WNL Cooking/Meal Prep: I Housework: I Money Management: I Driving: yes  Prior Activity Level: Community (5-7x/wk): Went out daily to pick up grandson from pre school  ADLs/Mobility: ADL Upper Body Dressing: Performed;Minimal assistance Upper Body Dressing Details (indicate cue type and reason): Pt donned gown with min assist for stitting balance and to thread RUE through  sleeve. Where Assessed - Upper Body Dressing: Sitting, bed;Supported Toilet Transfer: Simulated;+2 Total assistance;Comment for patient % (40%) Toilet Transfer Details (indicate cue type and reason): assist to block Rt. knee to prevent buckling. support to Rt. UE to prevent it from hanging at side.  max manual cues given throughout bil. posterior hips to facilitate anterior pelvic tilt and midline orientation.  transferred from bed to chair. Toilet Transfer Method: Surveyor, minerals: Other (comment) (chair) Toileting - Clothing Manipulation: Performed;Moderate assistance Toileting - Clothing Manipulation Details (indicate cue type and reason): Mod assist to bring gown over hips. Where Assessed - Toileting Clothing Manipulation: Sit to stand from 3-in-1 or toilet Toileting - Hygiene: Performed;Set up Toileting - Hygiene Details (indicate cue type and reason): setup to fold toilet paper  Where Assessed - Toileting Hygiene: Sit on 3-in-1 or toilet ADL Comments: Pt sat EOB to perform dynamic sitting balance tasks with mod assist given to bil. posterior hips and Rt anterior tibialis due to increase R LE tone.  Pt performed static/dynamic standing balance tasks at sink with chair behind pt.  Pt required +2total pt 40-50% standing at sink to provide manual assist to Rt. knee and bil posterior hips to facilitate midline orientation and promote anterior pelvic tilt.  Max VC to use mirror as enviromental cue to maintain midline orientation.  Mod assist to Rt. UE for WB on sink with tactile cues given to triceps.   Bed Mobility Bed Mobility: Yes Rolling Right: 3: Mod assist;With rail Rolling Right Details (indicate cue type and reason): assist to control movement and for safety due to pt's impulsive behavior Rolling Left: 5: Supervision;With rail Rolling Left Details (indicate cue type and reason): VC for hand placement and sequencing. No physical assist needed  Right Sidelying to Sit:   (HOB 50 degrees) Right Sidelying to Sit Details (indicate cue type and reason): assist to support trunk and shoulders OOB (Assist with RLE for control) Left Sidelying to Sit: With rails;4: Min assist;HOB flat Left Sidelying to Sit Details (indicate cue type and reason): Assist through hand pull up per pt request. Encouragement to maximize independence. Sitting - Scoot to Edge of Bed: 5: Supervision Sitting - Scoot to Edge of Bed Details (indicate cue type and reason): VC for weight shifting. Pt required increased time for initiation and for motor planning. Cues for lateral weightshifts to unload contralateral hip. Transfers Transfers: Yes Sit to Stand: 1: +2 Total assist;Patient percentage (comment);From bed;From chair/3-in-1;With armrests;With upper extremity assist (70%) Sit to Stand Details (indicate cue type and reason): assist to block Rt knee to prevent buckling. Facilitation through pelvis for control of midline positioning. support again given to RUE to prevent hanging at side and help prevent subluxation Stand to Sit: 3: Mod assist;To chair/3-in-1;With armrests;With upper extremity assist Stand to Sit Details: VC throughout for sequencing and control, especially on decent. Pt required increased cueign to place hands on chair to assist with descent. Stand Pivot Transfers: 1: +2 Total assist;Patient percentage (comment) (65%) Stand Pivot Transfer Details (indicate cue type and reason): Transferred from bed to 3n1. Facilitation of  Rt. quad to promote modulation of  knee control to recurvatuum and excessive flexion. Manual cues given throughout bil. posterior hips to facilitate anterior pelvic tilt and midline orientation for safety during transfer. Pt able to flex R hip with cueing and minimal tactile cues Ambulation/Gait Ambulation/Gait: Yes Ambulation/Gait Assistance: 1: +2 Total assist;Patient percentage (comment) Ambulation/Gait Assistance Details (indicate cue type and reason): pt = 50%;  Tactile cues given throughout bil. posterior hips to facilitate anterior pelvic tilt and midline orientation for safety during ambulation. PT to initiate first step with Rt. LE and assist with foot clearance, pt able to initiate the rest. Facilitation of bilateral weightshifting with stepping. Facilitation through Rt. quads for grading knee flexion/extension during Rt. LE stance. Pt also has tendency to adduct Rt. LE and supinate Rt. foot placing her at high risk for inversion sprain (family, pt, and nursing educated on this). Ambulation Distance (Feet): 4 Feet (Approximately 4 steps bilaterally (8 total)) Assistive device: 1 person hand held assist Gait Pattern: Step-to pattern;Shuffle;Decreased hip/knee flexion - right;Decreased stance time - right;Decreased weight shift to right;Decreased weight shift to left;Trunk flexed;Scissoring;Right genu recurvatum Stairs: No Balance Balance Assessed: Yes Static Sitting Balance Static Sitting - Balance Support: Left upper extremity supported;Feet supported Static Sitting - Level of Assistance: 3: Mod assist Static Sitting - Comment/# of Minutes: Mod assist secondary to pt with right lean unsupported. Dynamic Sitting Balance Dynamic Sitting - Balance Support: Right upper extremity supported;Feet supported (unable to maintain full support with RUE secondary to PFtone) Dynamic Sitting - Level of Assistance: 3: Mod assist Dynamic Sitting Balance - Compensations: Mod assist with trunk control throughout balance activities(bending down to ground, reaching forward, reaching across midline, and reaching laterally). Pt able to correct herself back into midline after each reaching activity with minimal cueing.  Dynamic Sitting - Balance Activities: Forward lean/weight shifting;Reaching across midline Static Standing Balance Static Standing - Balance Support: Bilateral upper extremity supported (decreased RUE, tactile and manual support to maintain WB) Static  Standing - Level of Assistance: 1: +2 Total assist;Patient percentage (comment) (pt= 50%) Static Standing - Comment/# of Minutes: Manual facilitation at trunk as well as at R knee to prevent buckling and recurvatuum. Cues to prevent right lateral lean  Home Assistive Devices/Equipment:  Home Assistive Devices/Equipment Home Assistive Devices/Equipment: Cane;Elevated toliet seat;Eyeglasses  Discharge Planning:  Living Arrangements: Alone Support Systems: Children;Family members;Friends/neighbors Assistance Needed: possibly Do you have any problems obtaining your medications?: No Type of Residence: Private residence Home Care Services: No Patient expects to be discharged to:: home Expected Discharge Date:  (pending) Case Management Consult Needed: No  Previous Home Environment:  Living Arrangements: Alone Support Systems: Children;Family members;Friends/neighbors Assistance Needed: possibly Do you have any problems obtaining your medications?: No Type of Residence: Private residence Home Care Services: No Patient expects to be discharged to:: home Expected Discharge Date:  (pending)  Discharge Living Setting:  Plans for Discharge Living Setting: Patient's home;Alone;House Discharge Living Setting Number of Levels: 1 Discharge Living Setting Number of Steps: 0 Discharge Living Setting is Bedroom on Main Floor?: Yes Discharge Living Setting is Bathroom on Main Floor?: Yes  Social/Family/Support Systems:  Patient Roles: Spouse;Parent Contact Information: Loel Lofty (c [phone]) Kathreen Devoid - spouse (separated) (c) [phone]) Anticipated Caregiver: Willa Frater - friend to come and stay with patient Ability/Limitations of Caregiver: Dtr works, separated from spouse Caregiver Availability: 24/7 Discharge Plan Discussed with Primary Caregiver: Yes Is Caregiver In Agreement with Plan?: Yes Does Caregiver/Family have Issues with Lodging/Transportation while Pt is in Rehab?:  No  Goals/Additional Needs:  Patient/Family Goal for Rehab: PT/OT S/min A, ST Mod I goals (ELOS = 2-3 weeks) Cultural Considerations: None Dietary Needs: Diabetic Equipment Needs: TBD Pt/Family Agrees to Admission and willing to participate: Yes Program Orientation Provided & Reviewed with Pt/Caregiver Including Roles  & Responsibilities: Yes  Preadmission Screen Completed By:  Trish Mage, 05/14/2011 10:09 AM  Patient's condition:  This patient's condition remains as documented in the Consult dated 05/09/11, in which the Rehabilitation Physician determined and documented that the patient's condition is appropriate for intensive rehabilitative care in an inpatient rehabilitation facility.  Preadmission Screen Competed by: Roderic Palau, RN, Time/Date,1357/05/08/13. Preadmission Screen updated by Roderic Palau, RN [id number] on 05/14/11.  Discussed status with Dr. Riley Kill on 03/08/13at 1400 (time/date) and received telephone approval for admission today.  Update:  Will plan to admit 05/10/11 after all tests and procedure results have been confirmed.   Discussed status with Dr. Riley Kill on 05/14/11 at 1340 and received telephone approval for admission today.  This is an addendum since patient was not admitted on 05/09/11.  Admission Coordinator:  Trish Mage, time1433/Date03/08/13 Admission Coordinator:  Trish Mage, 1339/05/14/13 Addendum done.

## 2011-05-09 NOTE — Progress Notes (Signed)
Subjective:  Chelsey Gallagher is an 61 y.o. female who reports that she laid down about 10pm and felt fine at that time. Got up about 1015 and felt nauseous and dizzy. Had some vomiting. Looked in the mirror and noted that she had a right facial droop. Then noted numbness in her right upper extremity. It went to her right lower extremity and then to the right ear. She attempted to call her daughter but had difficulty managing to et to the phone and then dial. When speaking noticed that her speech was slurred. Her daughter called EMS. EMS initiated the code stroke She presented beyond IV TPA window. She continues to have slurred speech, right facial weakness right hemiplegia and sensory loss. 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 MRA suggests poor flow in LVA raising concern for dissection versus occlusion.Carotid dopplers and echo are pending    Objective: Vital signs in last 24 hours: Temp:  [97.7 F (36.5 C)-98.7 F (37.1 C)] 98.4 F (36.9 C) (03/08 0929) Pulse Rate:  [67-81] 68  (03/08 0929) Resp:  [17-20] 18  (03/08 0929) BP: (101-200)/(68-80) 168/78 mmHg (03/08 1000) SpO2:  [93 %-95 %] 93 % (03/08 0929) Weight change:  Last BM Date: 05/07/11  Intake/Output from previous day: 03/07 0701 - 03/08 0700 In: 610 [I.V.:610] Out: -  Intake/Output this shift:    Physical exam   pleasant middle-aged Caucasian lady currently not in distress. Afebrile. Head is nontraumatic. Neck is supple with soft carotid bruit. Hearing appears decreased on the right. Distal pulses are well.Cardiac exam no murmur or gallop. Lungs are clear to auscultation. Abdomen soft nontender. Neurological Exam awake alert oriented to time place and person. Moderate dysarthria but can be understood. Follows commands well. Right gaze preference but able to look to the left. Mild  hypertrophia of the right eye. Rotatory nystagmus on lateral gaze right more than left. Moderate right lower facial weakness. Tongue is midline. Dense right upper extremity plegia with 0/5 strength. Right lower extremity grade 2-3/5 strength. Diminished sensation on the right. Impaired coordination on the right. Gait was not tested.  Lab Results:  Basename 05/09/11 4098 05/08/11 0221 05/08/11 0219  WBC 8.6 -- 7.0  HGB 16.2* 17.7* --  HCT 47.8* 52.0* --  PLT 155 -- 141*   BMET  Basename 05/09/11 0632 05/08/11 1227  NA 131* 130*  K 3.8 4.1  CL 97 95*  CO2 26 27  GLUCOSE 226* 275*  BUN 12 15  CREATININE 0.48* 0.52  CALCIUM 9.2 9.4    Studies/Results: Dg Chest 2 View  05/08/2011  *RADIOLOGY REPORT*  Clinical Data: Stroke, right-sided weakness, shortness of breath  CHEST - 2 VIEW  Comparison: None.  Findings: Lungs are essentially clear. No pleural effusion or pneumothorax.  Cardiomediastinal silhouette is within normal limits.  Degenerative changes of the visualized thoracolumbar spine.  Surgical clips in the right upper abdomen.  IMPRESSION: No evidence of acute cardiopulmonary disease.  Original Report Authenticated By: Charline Bills, M.D.   Ct Head Wo Contrast  05/08/2011  *RADIOLOGY REPORT*  Clinical Data:  Code stroke, right side weakness  CT HEAD WITHOUT CONTRAST  Technique:  Contiguous axial images were obtained from the base of the skull through the vertex without contrast.  Comparison: None  Findings: Normal ventricular morphology. No midline shift or mass effect. Normal appearance of brain parenchyma. No intracranial hemorrhage, mass lesion or evidence  of acute infarction. No extra-axial fluid collections. Atherosclerotic calcification within internal carotid arteries at skull base bilaterally. Visualized paranasal sinuses and mastoid air cells clear. Bones unremarkable.  IMPRESSION: No acute intracranial abnormalities.  These results were called by telephone on 05/08/2011  at   0230 hrs to  Dr. Rubin Payor, who verbally acknowledged these results.  Original Report Authenticated By: Lollie Marrow, M.D.   Mr Brain Wo Contrast  05/08/2011  *RADIOLOGY REPORT*  Clinical Data:  Right-sided weakness with slurred speech and facial droop. High blood pressure.  Diabetes.  MRI HEAD WITHOUT CONTRAST MRA HEAD WITHOUT CONTRAST  Technique: Multiplanar, multiecho pulse sequences of the brain and surrounding structures were obtained according to standard protocol without intravenous contrast.  Angiographic images of the head were obtained using MRA technique without contrast.  Comparison: 05/08/2011 head CT.  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 Tiny acute non hemorrhagic posterior left cerebellar infarct.  Mild to moderate nonspecific white matter type changes most consistent with result of small vessel disease in this hypertensive diabetic patient.  This involves the subcortical periventricular region as well as a central pons.  Cava septum pellucidum et vergae incidentally noted.  No hydrocephalus.  Abnormal appearance of the left vertebral artery and left carotid artery.  Please see below.  Polypoid opacification inferior aspect right maxillary sinus. Minimal paranasal sinus mucosal thickening.  No intracranial mass lesion detected on this unenhanced motion degraded exam.  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  Tiny acute non hemorrhagic posterior left cerebellar infarct.  Please see above.  MRA HEAD  Findings: Exam limited by motion.  Left internal carotid artery is small in caliber.  This may be related to proximal stenosis.  Superimposed atherosclerotic type changes (dissection not excluded).  Nonvisualization left vertebral artery and left PICA.  This may be related to the occlusion of atherosclerosis.  Dissection not excluded.  Ectatic right internal carotid artery.  Small bulge proximal cavernous segment may be related to  atherosclerosis rather than aneurysm.  Stability can be confirmed on follow-up.  Prominent ectasia right carotid terminus.  No definitive aneurysm. Evaluation limited by motion.  Mild to moderate narrowing of the M1 segment of the right middle cerebral artery.  Mild to moderate narrowing M1 segment left middle cerebral artery.  Hypoplastic versus markedly diseased A1 segment of the left anterior cerebral artery.  Middle cerebral artery moderate branch vessel irregularity.  Mild narrowing distal right vertebral artery.  Mild to moderate irregularity with areas of mild to moderate narrowing basilar artery.  Nonvisualization right AICA.  Fetal type origin of the right posterior cerebral artery.  Moderate to marked narrowing involving portions of the posterior cerebral artery bilaterally.  IMPRESSION: Left internal carotid artery is small in caliber.  This may be related to proximal stenosis.  Superimposed atherosclerotic type changes (dissection not excluded).  Nonvisualization left vertebral artery and left PICA.  This may be related to the occlusion of atherosclerosis.  Dissection not excluded.  Please see above.  Original Report Authenticated By: Fuller Canada, M.D.   Mr Mra Head/brain Wo Cm  05/08/2011  *RADIOLOGY REPORT*  Clinical Data:  Right-sided weakness with slurred speech and facial droop. High blood pressure.  Diabetes.  MRI HEAD WITHOUT CONTRAST MRA HEAD WITHOUT CONTRAST  Technique: Multiplanar, multiecho pulse sequences of the brain and surrounding structures were obtained according to standard protocol without intravenous contrast.  Angiographic images of the head were obtained using MRA technique without contrast.  Comparison:  05/08/2011 head CT.  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 Tiny acute non hemorrhagic posterior left cerebellar infarct.  Mild to moderate nonspecific white matter type changes most consistent with result of small vessel disease in this  hypertensive diabetic patient.  This involves the subcortical periventricular region as well as a central pons.  Cava septum pellucidum et vergae incidentally noted.  No hydrocephalus.  Abnormal appearance of the left vertebral artery and left carotid artery.  Please see below.  Polypoid opacification inferior aspect right maxillary sinus. Minimal paranasal sinus mucosal thickening.  No intracranial mass lesion detected on this unenhanced motion degraded exam.  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  Tiny acute non hemorrhagic posterior left cerebellar infarct.  Please see above.  MRA HEAD  Findings: Exam limited by motion.  Left internal carotid artery is small in caliber.  This may be related to proximal stenosis.  Superimposed atherosclerotic type changes (dissection not excluded).  Nonvisualization left vertebral artery and left PICA.  This may be related to the occlusion of atherosclerosis.  Dissection not excluded.  Ectatic right internal carotid artery.  Small bulge proximal cavernous segment may be related to atherosclerosis rather than aneurysm.  Stability can be confirmed on follow-up.  Prominent ectasia right carotid terminus.  No definitive aneurysm. Evaluation limited by motion.  Mild to moderate narrowing of the M1 segment of the right middle cerebral artery.  Mild to moderate narrowing M1 segment left middle cerebral artery.  Hypoplastic versus markedly diseased A1 segment of the left anterior cerebral artery.  Middle cerebral artery moderate branch vessel irregularity.  Mild narrowing distal right vertebral artery.  Mild to moderate irregularity with areas of mild to moderate narrowing basilar artery.  Nonvisualization right AICA.  Fetal type origin of the right posterior cerebral artery.  Moderate to marked narrowing involving portions of the posterior cerebral artery bilaterally.  IMPRESSION: Left internal carotid artery is small in caliber.  This may be related to  proximal stenosis.  Superimposed atherosclerotic type changes (dissection not excluded).  Nonvisualization left vertebral artery and left PICA.  This may be related to the occlusion of atherosclerosis.  Dissection not excluded.  Please see above.  Original Report Authenticated By: Fuller Canada, M.D.  HbA1c 10.8 % Cholesterol 212, LDl 142 mg %   Medications: I have reviewed the patient's current medications.  Assessment/Plan:  61 y.o. female presenting with nystagmus, dysarthria and right hemiparesis of acute onset from left medullary and cerebellar infarct. Patient with multiple risk factors for stroke.  Stroke Risk Factors - diabetes mellitus, hyperlipidemia, hypertension and smoking   Plan ; Check CT angiogram for LVA dissection but medically stable to go to inpatient rehab after test today. Strict control of diabetes, lipids and HT  LOS: 1 day   Irene Collings,PRAMODKUMAR P 05/09/2011, 12:23 PM

## 2011-05-09 NOTE — Progress Notes (Signed)
Occupational Therapy Treatment Patient Details Name: Chelsey Gallagher MRN: [id number] DOB: Feb 28, 1951 Today's Date: 05/09/2011  OT Assessment/Plan OT Assessment/Plan Comments on Treatment Session: Pt with glasses today and reporting consistent blurry vision.  Pt report vision is better with one eye closed.  Spoke with RN and Geographical information systems officer to order eye patch for patient.  Educated pt and RN on eye patch wearing schedule (switch patch over eyes every 2 hours).  Pt with minimal muscle activation in Rt hand (2nd and 3rd digits).  Continued education with pt for proper positioning of R UE and safe technique for moving RUE with use of LUE. OT Plan: Discharge plan remains appropriate OT Frequency: Min 2X/week Recommendations for Other Services: Rehab consult Follow Up Recommendations: Skilled nursing facility;Inpatient Rehab Equipment Recommended: Defer to next venue OT Goals ADL Goals Pt Will Transfer to Toilet: with mod assist;with DME;3-in-1;Stand pivot transfer ADL Goal: Toilet Transfer - Progress: Progressing toward goals Miscellaneous OT Goals Miscellaneous OT Goal #1: Pt will perform dynamic sitting balance tasks ~5 min with no loss of balance and min tactile cueing in prep for EOB ADL activity. OT Goal: Miscellaneous Goal #1 - Progress: Goal set today  OT Treatment Precautions/Restrictions  Precautions Precautions: Fall Required Braces or Orthoses: No Restrictions Weight Bearing Restrictions: No   ADL ADL Upper Body Dressing: Performed;Minimal assistance Upper Body Dressing Details (indicate cue type and reason): Pt donned gown with min assist for stitting balance and to thread RUE through sleeve. Where Assessed - Upper Body Dressing: Sitting, bed;Supported Toilet Transfer: Simulated;+2 Total assistance;Comment for patient % (40%) Toilet Transfer Details (indicate cue type and reason): assist to block Rt. knee to prevent buckling. support to Rt. UE to prevent it from hanging  at side.  max manual cues given throughout bil. posterior hips to facilitate anterior pelvic tilt and midline orientation.  transferred from bed to chair. Toilet Transfer Method: Surveyor, minerals: Other (comment) (chair) ADL Comments: Pt sat EOB to perform dynamic sitting balance tasks with mod assist given to bil. posterior hips and Rt anterior tibialis due to increase R LE tone.  Pt performed static/dynamic standing balance tasks at sink with chair behind pt.  Pt required +2total pt 40-50% standing at sink to provide manual assist to Rt. knee and bil posterior hips to facilitate midline orientation and promote anterior pelvic tilt.  Max VC to use mirror as enviromental cue to maintain midline orientation.  Mod assist to Rt. UE for WB on sink with tactile cues given to triceps.  Mobility  Bed Mobility Bed Mobility: Yes Rolling Left: 5: Supervision;With rail Rolling Left Details (indicate cue type and reason): supervision for safety Right Sidelying to Sit:  (HOB 50 degrees) Left Sidelying to Sit: 5: Supervision;With rails;HOB elevated (comment degrees) (HOB 50 degrees) Left Sidelying to Sit Details (indicate cue type and reason): cueing for efficient sequencing Sitting - Scoot to Edge of Bed: 5: Supervision Sitting - Scoot to Edge of Bed Details (indicate cue type and reason): cueing to place feet flat on floor.  supervision for safety. Transfers Transfers: Yes Sit to Stand: 1: +2 Total assist;Patient percentage (comment);From bed;From chair/3-in-1;With armrests;With upper extremity assist (60%) Sit to Stand Details (indicate cue type and reason): assist to block Rt knee to prevent buckling and throughout bil. posterior hips to facilitate anterior weight shift and maintain midline orientation.  support given to RUE to prevent hanging at side and help prevent subluxation. Stand to Sit: 3: Mod assist;To chair/3-in-1;With armrests;With upper extremity assist Stand  to Sit Details:  max VC to control decent. manual cues to reach back for armrests with L UE. Exercises    End of Session OT - End of Session Equipment Utilized During Treatment: Gait belt Activity Tolerance: Patient limited by fatigue Patient left: in chair;with call bell in reach;with family/visitor present Nurse Communication: Mobility status for transfers;Other (comment) (recommending eye patch) General Behavior During Session: [facility] for tasks performed Cognition: Impaired Cognitive Impairment: slightly impulsive and decreased safety awareness   1:17 PM 05/09/2011 Cipriano Mile OTR/L Pager [phone] Office [phone]

## 2011-05-09 NOTE — Consult Note (Signed)
Physical Medicine and Rehabilitation Consult Reason for Consult: Stroke Referring Phsyician: Dr. Evelene Croon Chelsey Gallagher is an 61 y.o. female.   HPI: 61 year old right-handed female with history of diabetes mellitus and tobacco abuse admitted March 7 with right-sided weakness and mild slurred speech. 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 Echocardiogram with ejection fractional 70% grade 1 diastolic dysfunction. Carotid Doppler for pending. Noted blood pressure 207/102 on admission and placed on intravenous labetalol. She did not receive TPA. Neurology follow up and placed on aspirin as well as subcutaneous lovenox. Speech therapy placed on dysphagia 3 thin liquid diet. History of diabetes mellitus with hemoglobin A1c of 10.2 currently on sliding scale insulin. Physical occupational therapy treatments ongoing with recommendations for physical medicine rehabilitation consult  Review of Systems  Respiratory: Positive for cough.   Gastrointestinal: Positive for nausea.  Neurological: Positive for headaches.       Restless legs  Psychiatric/Behavioral: Positive for depression.  All other systems reviewed and are negative.   Past Medical History  Diagnosis Date  . Diabetes mellitus   . Hypertension   . Depression   . PAD (peripheral artery disease)     S/p bypass grafting, unclear exactly where  . Nephrolithiasis    Past Surgical History  Procedure Date  . Total hip arthroplasty     Right  . Abdominal hysterectomy   . Cholecystectomy 1984  . Appendectomy    History reviewed. No pertinent family history. Social History:  reports that she has been smoking Cigarettes.  She has a 45 pack-year smoking history. She has never used smokeless tobacco. She reports that she does not drink alcohol or use illicit drugs. Allergies:  Allergies  Allergen Reactions  . Codeine   . Flexeril  (Cyclobenzaprine Hcl)    Medications Prior to Admission  Medication Dose Route Frequency Provider Last Rate Last Dose  . 0.9 %  sodium chloride infusion   Intravenous Continuous Stephani Police, PA 50 mL/hr at 05/08/11 2200    . acetaminophen (TYLENOL) tablet 650 mg  650 mg Oral Q4H PRN Carlota Raspberry, MD       Or  . acetaminophen (TYLENOL) suppository 650 mg  650 mg Rectal Q4H PRN Carlota Raspberry, MD      . aspirin suppository 300 mg  300 mg Rectal Daily Carlota Raspberry, MD       Or  . aspirin tablet 325 mg  325 mg Oral Daily Carlota Raspberry, MD   325 mg at 05/08/11 1051  . citalopram (CELEXA) tablet 20 mg  20 mg Oral Daily Carlota Raspberry, MD   20 mg at 05/08/11 1052  . enoxaparin (LOVENOX) injection 40 mg  40 mg Subcutaneous Q24H Carlota Raspberry, MD   40 mg at 05/08/11 1025  . gabapentin (NEURONTIN) capsule 300 mg  300 mg Oral Q supper Carlota Raspberry, MD   300 mg at 05/08/11 1756  . gabapentin (NEURONTIN) tablet 600 mg  600 mg Oral To Major Juliet Rude. Pickering, MD   600 mg at 05/08/11 0413  . gabapentin (NEURONTIN) tablet 600 mg  600 mg Oral QHS Carlota Raspberry, MD   600 mg at 05/08/11 2206  . insulin aspart (novoLOG) injection 0-15 Units  0-15 Units Subcutaneous TID WC Carlota Raspberry, MD   3 Units at 05/08/11 1755  . labetalol (NORMODYNE,TRANDATE) 5 MG/ML injection        10 mg at 05/08/11 0537  . labetalol (NORMODYNE,TRANDATE) injection 10  mg  10 mg Intravenous Q2H PRN Carlota Raspberry, MD      . lisinopril (PRINIVIL,ZESTRIL) tablet 20 mg  20 mg Oral Daily Carlota Raspberry, MD   20 mg at 05/08/11 1051  . ondansetron (ZOFRAN) injection 4 mg  4 mg Intravenous Q6H PRN Carlota Raspberry, MD      . rOPINIRole (REQUIP) tablet 3 mg  3 mg Oral To Major Juliet Rude. Pickering, MD   3 mg at 05/08/11 0413  . rOPINIRole (REQUIP) tablet 3 mg  3 mg Oral QHS Carlota Raspberry, MD   3 mg at 05/08/11 2206  . senna-docusate (Senokot-S) tablet 1 tablet  1 tablet Oral QHS PRN Carlota Raspberry, MD      . simvastatin (ZOCOR) tablet 20 mg  20 mg Oral q1800 Carlota Raspberry, MD   20 mg at  05/08/11 1756  . DISCONTD: 0.9 %  sodium chloride infusion   Intravenous Continuous Juliet Rude. Pickering, MD 100 mL/hr at 05/08/11 0321    . DISCONTD: gabapentin (NEURONTIN) tablet 300-600 mg  300-600 mg Oral BID Carlota Raspberry, MD       No current outpatient prescriptions on file as of 05/09/2011.    Home: Home Living Lives With: Alone Receives Help From: Family (intermittent assistance) Type of Home: House Home Layout: One level Home Access: Stairs to enter Entrance Stairs-Rails: None Entrance Stairs-Number of Steps: 1 (inch step in) Bathroom Shower/Tub: Walk-in shower;Door [employer] Toilet: Handicapped height Bathroom Accessibility: Yes How Accessible: Accessible via walker Home Adaptive Equipment: Walker - rolling;Bedside commode/3-in-1;Reacher  Functional History: Prior Function Level of Independence: Independent with basic ADLs;Independent with homemaking with ambulation;Independent with transfers;Independent with gait Able to Take Stairs?: Yes Driving: Yes Vocation: Retired Leisure: Hobbies-yes (Comment) Comments: reading, computer Functional Status:  Mobility: Bed Mobility Bed Mobility: Yes Rolling Right: 3: Mod assist;With rail Rolling Right Details (indicate cue type and reason): assist to control movement and for safety due to pt's impulsive behavior Right Sidelying to Sit: 1: +2 Total assist;Patient percentage (comment);With rails (30%) Right Sidelying to Sit Details (indicate cue type and reason): assist to support trunk and shoulders OOB (Assist with RLE for control) Sitting - Scoot to Edge of Bed: 3: Mod assist Sitting - Scoot to Edge of Bed Details (indicate cue type and reason): assist for safety due to impulsive behavior (VC for sequencing and hand placement) Transfers Transfers: Yes Sit to Stand: 1: +2 Total assist;Patient percentage (comment);From bed;From chair/3-in-1;With upper extremity assist;With armrests (50%) Sit to Stand Details (indicate cue type and  reason): assist to block Rt. knee and support Rt. UE and manual cues provided throughout bil. hips to promote upright posture. VC for safe Lt. hand placement. Stand to Sit: 1: +2 Total assist;Patient percentage (comment);To chair/3-in-1;With armrests (50%) Stand to Sit Details: assist to control descent and support Rt. UE. VC to for safe Lt. hand placement. Stand Pivot Transfers: 1: +2 Total assist;Patient percentage (comment) (30%) Stand Pivot Transfer Details (indicate cue type and reason): Assist x 1 to control buckling on RLE and assist of other for stability and control as well as tactile cues for proper sequencing both from bed to 3-1 and from 3-1 to chair. Transferred to right side for increased mobility. Pt required cueing throughout for safety secondary to impulsivity Ambulation/Gait Ambulation/Gait: No    ADL: ADL Toilet Transfer: Performed;+2 Total assistance;Comment for patient % (30%) Toilet Transfer Details (indicate cue type and reason): assist to block Rt. knee and provide tactile cues to bil. posterior hips to increase upright posture. tranferred  to Lt. side Toilet Transfer Method: Surveyor, minerals: Bedside commode Toileting - Clothing Manipulation: Performed;Moderate assistance Toileting - Clothing Manipulation Details (indicate cue type and reason): Mod assist to bring gown over hips. Where Assessed - Toileting Clothing Manipulation: Sit to stand from 3-in-1 or toilet Toileting - Hygiene: Performed;Set up Toileting - Hygiene Details (indicate cue type and reason): setup to fold toilet paper  Where Assessed - Toileting Hygiene: Sit on 3-in-1 or toilet ADL Comments: Pt and family educated on proper positioning of R UE.  Pt also educated on using LUE to move RUE without pulling on Rt. digits. Pt required min-mod assist on Rt. side to promote midline orientation. Pt sat EOB ~5 minutes. Anticipate that pt will require min assist with grooming and feeding tasks  due to decreased R UE functional use.  Pt will require mod-max assist for UB/LB ADLs.   Cognition: Cognition Arousal/Alertness: Awake/alert Orientation Level: Oriented X4 Cognition Arousal/Alertness: Awake/alert Overall Cognitive Status: Impaired Orientation Level: Oriented X4 Safety/Judgement: Decreased safety judgement for tasks assessed Decreased Safety/Judgement: Impulsive Safety/Judgement - Other Comments: Pt very impulsive with movements and required constant cuing to slow down. Awareness of Deficits: Other (comment) (aware of deficits, although still impulsive)  Blood pressure 169/78, pulse 74, temperature 97.7 F (36.5 C), temperature source Oral, resp. rate 20, height 5\' 3"  (1.6 m), weight 67.087 kg (147 lb 14.4 oz), SpO2 93.00%. Physical Exam  Vitals reviewed. Constitutional: She is oriented to person, place, and time. She appears well-developed.  HENT:  Head: Normocephalic.  Neck: Normal range of motion. Neck supple. No thyromegaly present.  Cardiovascular: Normal rate and regular rhythm.   Pulmonary/Chest: Effort normal and breath sounds normal. She has no wheezes.  Abdominal: Soft. She exhibits no distension. There is no tenderness.  Musculoskeletal: She exhibits no edema.  Neurological: She is alert and oriented to person, place, and time.       She is somewhat hard of hearing. Shows good awareness of deficits. Follow three-step commands. Dysarthric speech. Gaze dysconjugate. Right facial weakness and sensory loss.  A few beats of nystagmus with superior gaze and left gaze. RUE 0'/5. RLE 2+ prox to trace distally at ADF and 1-2/5 APF.  Left side motor and sensory nromal. Sensation 1/2 on right. She can sense pain.  Skin: Skin is warm and dry.  Psychiatric: She has a normal mood and affect.       Mild anxiety    Results for orders placed during the hospital encounter of 05/08/11 (from the past 24 hour(s))  GLUCOSE, CAPILLARY     Status: Abnormal   Collection Time    05/08/11  7:55 AM      Component Value Range   Glucose-Capillary 296 (*) 70 - 99 (mg/dL)  GLUCOSE, CAPILLARY     Status: Abnormal   Collection Time   05/08/11 11:38 AM      Component Value Range   Glucose-Capillary 321 (*) 70 - 99 (mg/dL)  HEMOGLOBIN Z6X     Status: Abnormal   Collection Time   05/08/11 12:27 PM      Component Value Range   Hemoglobin A1C 10.2 (*) <5.7 (%)   Mean Plasma Glucose 246 (*) <117 (mg/dL)  FERRITIN     Status: Normal   Collection Time   05/08/11 12:27 PM      Component Value Range   Ferritin 115  10 - 291 (ng/mL)  IRON AND TIBC     Status: Abnormal   Collection Time   05/08/11  12:27 PM      Component Value Range   Iron <10 (*) 42 - 135 (ug/dL)   TIBC Not calculated due to Iron <10.  250 - 470 (ug/dL)   Saturation Ratios Not calculated due to Iron <10.  20 - 55 (%)   UIBC 256  125 - 400 (ug/dL)  BASIC METABOLIC PANEL     Status: Abnormal   Collection Time   05/08/11 12:27 PM      Component Value Range   Sodium 130 (*) 135 - 145 (mEq/L)   Potassium 4.1  3.5 - 5.1 (mEq/L)   Chloride 95 (*) 96 - 112 (mEq/L)   CO2 27  19 - 32 (mEq/L)   Glucose, Bld 275 (*) 70 - 99 (mg/dL)   BUN 15  6 - 23 (mg/dL)   Creatinine, Ser 1.30  0.50 - 1.10 (mg/dL)   Calcium 9.4  8.4 - 86.5 (mg/dL)   GFR calc non Af Amer >90  >90 (mL/min)   GFR calc Af Amer >90  >90 (mL/min)  LIPID PANEL     Status: Abnormal   Collection Time   05/08/11 12:30 PM      Component Value Range   Cholesterol 216 (*) 0 - 200 (mg/dL)   Triglycerides 784 (*) <150 (mg/dL)   HDL 25 (*) >69 (mg/dL)   Total CHOL/HDL Ratio 8.6     VLDL 56 (*) 0 - 40 (mg/dL)   LDL Cholesterol 629 (*) 0 - 99 (mg/dL)  GLUCOSE, CAPILLARY     Status: Abnormal   Collection Time   05/08/11  5:44 PM      Component Value Range   Glucose-Capillary 174 (*) 70 - 99 (mg/dL)  GLUCOSE, CAPILLARY     Status: Abnormal   Collection Time   05/08/11  9:47 PM      Component Value Range   Glucose-Capillary 239 (*) 70 - 99 (mg/dL)   Comment 1  Documented in Chart     Comment 2 Notify RN     Dg Chest 2 View  05/08/2011  *RADIOLOGY REPORT*  Clinical Data: Stroke, right-sided weakness, shortness of breath  CHEST - 2 VIEW  Comparison: None.  Findings: Lungs are essentially clear. No pleural effusion or pneumothorax.  Cardiomediastinal silhouette is within normal limits.  Degenerative changes of the visualized thoracolumbar spine.  Surgical clips in the right upper abdomen.  IMPRESSION: No evidence of acute cardiopulmonary disease.  Original Report Authenticated By: Charline Bills, M.D.   Ct Head Wo Contrast  05/08/2011  *RADIOLOGY REPORT*  Clinical Data:  Code stroke, right side weakness  CT HEAD WITHOUT CONTRAST  Technique:  Contiguous axial images were obtained from the base of the skull through the vertex without contrast.  Comparison: None  Findings: Normal ventricular morphology. No midline shift or mass effect. Normal appearance of brain parenchyma. No intracranial hemorrhage, mass lesion or evidence of acute infarction. No extra-axial fluid collections. Atherosclerotic calcification within internal carotid arteries at skull base bilaterally. Visualized paranasal sinuses and mastoid air cells clear. Bones unremarkable.  IMPRESSION: No acute intracranial abnormalities.  These results were called by telephone on 05/08/2011  at  0230 hrs to  Dr. Rubin Payor, who verbally acknowledged these results.  Original Report Authenticated By: Lollie Marrow, M.D.   Mr Brain Wo Contrast  05/08/2011  *RADIOLOGY REPORT*  Clinical Data:  Right-sided weakness with slurred speech and facial droop. High blood pressure.  Diabetes.  MRI HEAD WITHOUT CONTRAST MRA HEAD WITHOUT CONTRAST  Technique: Multiplanar, multiecho pulse  sequences of the brain and surrounding structures were obtained according to standard protocol without intravenous contrast.  Angiographic images of the head were obtained using MRA technique without contrast.  Comparison: 05/08/2011 head CT.  MRI  HEAD  Findings:  Acute non hemorrhagic left paracentral medulla infarct. Tiny acute non hemorrhagic posterior left cerebellar infarct.  Mild to moderate nonspecific white matter type changes most consistent with result of small vessel disease in this hypertensive diabetic patient.  This involves the subcortical periventricular region as well as a central pons.  Cava septum pellucidum et vergae incidentally noted.  No hydrocephalus.  Abnormal appearance of the left vertebral artery and left carotid artery.  Please see below.  Polypoid opacification inferior aspect right maxillary sinus. Minimal paranasal sinus mucosal thickening.  No intracranial mass lesion detected on this unenhanced motion degraded exam.  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  Tiny acute non hemorrhagic posterior left cerebellar infarct.  Please see above.  MRA HEAD  Findings: Exam limited by motion.  Left internal carotid artery is small in caliber.  This may be related to proximal stenosis.  Superimposed atherosclerotic type changes (dissection not excluded).  Nonvisualization left vertebral artery and left PICA.  This may be related to the occlusion of atherosclerosis.  Dissection not excluded.  Ectatic right internal carotid artery.  Small bulge proximal cavernous segment may be related to atherosclerosis rather than aneurysm.  Stability can be confirmed on follow-up.  Prominent ectasia right carotid terminus.  No definitive aneurysm. Evaluation limited by motion.  Mild to moderate narrowing of the M1 segment of the right middle cerebral artery.  Mild to moderate narrowing M1 segment left middle cerebral artery.  Hypoplastic versus markedly diseased A1 segment of the left anterior cerebral artery.  Middle cerebral artery moderate branch vessel irregularity.  Mild narrowing distal right vertebral artery.  Mild to moderate irregularity with areas of mild to moderate narrowing basilar artery.  Nonvisualization right  AICA.  Fetal type origin of the right posterior cerebral artery.  Moderate to marked narrowing involving portions of the posterior cerebral artery bilaterally.  IMPRESSION: Left internal carotid artery is small in caliber.  This may be related to proximal stenosis.  Superimposed atherosclerotic type changes (dissection not excluded).  Nonvisualization left vertebral artery and left PICA.  This may be related to the occlusion of atherosclerosis.  Dissection not excluded.  Please see above.  Original Report Authenticated By: Fuller Canada, M.D.   Mr Mra Head/brain Wo Cm  05/08/2011  *RADIOLOGY REPORT*  Clinical Data:  Right-sided weakness with slurred speech and facial droop. High blood pressure.  Diabetes.  MRI HEAD WITHOUT CONTRAST MRA HEAD WITHOUT CONTRAST  Technique: Multiplanar, multiecho pulse sequences of the brain and surrounding structures were obtained according to standard protocol without intravenous contrast.  Angiographic images of the head were obtained using MRA technique without contrast.  Comparison: 05/08/2011 head CT.  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 Tiny acute non hemorrhagic posterior left cerebellar infarct.  Mild to moderate nonspecific white matter type changes most consistent with result of small vessel disease in this hypertensive diabetic patient.  This involves the subcortical periventricular region as well as a central pons.  Cava septum pellucidum et vergae incidentally noted.  No hydrocephalus.  Abnormal appearance of the left vertebral artery and left carotid artery.  Please see below.  Polypoid opacification inferior aspect right maxillary sinus. Minimal paranasal sinus mucosal thickening.  No intracranial mass lesion detected on this  unenhanced motion degraded exam.  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  Tiny acute non hemorrhagic posterior left cerebellar infarct.  Please see above.  MRA HEAD  Findings:  Exam limited by motion.  Left internal carotid artery is small in caliber.  This may be related to proximal stenosis.  Superimposed atherosclerotic type changes (dissection not excluded).  Nonvisualization left vertebral artery and left PICA.  This may be related to the occlusion of atherosclerosis.  Dissection not excluded.  Ectatic right internal carotid artery.  Small bulge proximal cavernous segment may be related to atherosclerosis rather than aneurysm.  Stability can be confirmed on follow-up.  Prominent ectasia right carotid terminus.  No definitive aneurysm. Evaluation limited by motion.  Mild to moderate narrowing of the M1 segment of the right middle cerebral artery.  Mild to moderate narrowing M1 segment left middle cerebral artery.  Hypoplastic versus markedly diseased A1 segment of the left anterior cerebral artery.  Middle cerebral artery moderate branch vessel irregularity.  Mild narrowing distal right vertebral artery.  Mild to moderate irregularity with areas of mild to moderate narrowing basilar artery.  Nonvisualization right AICA.  Fetal type origin of the right posterior cerebral artery.  Moderate to marked narrowing involving portions of the posterior cerebral artery bilaterally.  IMPRESSION: Left internal carotid artery is small in caliber.  This may be related to proximal stenosis.  Superimposed atherosclerotic type changes (dissection not excluded).  Nonvisualization left vertebral artery and left PICA.  This may be related to the occlusion of atherosclerosis.  Dissection not excluded.  Please see above.  Original Report Authenticated By: Fuller Canada, M.D.    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 Does the need for close, 24 hr/day medical supervision in concert with the patient's rehab needs make it unreasonable for this patient to be served in a less intensive setting? Yes 2. Co-Morbidities requiring  supervision/potential complications: htn, dm 3. Due to bladder management, bowel management, safety, skin/wound care, disease management, medication administration and patient education, does the patient require 24 hr/day rehab nursing? Yes 4. Does the patient require coordinated care of a physician, rehab nurse, PT (1-2 hrs/day, 5 days/week), OT (1-2 hrs/day, 5 days/week) and SLP (1-2 hrs/day, 5 days/week) to address physical and functional deficits in the context of the above medical diagnosis(es)? Yes Addressing deficits in the following areas: balance, endurance, locomotion, strength, transferring, bowel/bladder control, bathing, dressing, feeding, grooming, toileting, cognition, speech, language, swallowing and psychosocial support 5. Can the patient actively participate in an intensive therapy program of at least 3 hrs of therapy per day at least 5 days per week? Yes 6. The potential for patient to make measurable gains while on inpatient rehab is excellent 7. Anticipated functional outcomes upon discharge from inpatients are supervision to minimal assist PT, supervision to minimal assist with OT, mod I with SLP 8. Estimated rehab length of stay to reach the above functional goals is: 2-3 weeks 9. Does the patient have adequate social supports to accommodate these discharge functional goals? Potentially 10. Anticipated D/C setting: Home 11. Anticipated post D/C treatments: HH therapy 12. Overall Rehab/Functional Prognosis: excellent  RECOMMENDATIONS: This patient's condition is appropriate for continued rehabilitative care in the following setting: CIR  Patient has agreed to participate in recommended program. Yes Note that insurance prior authorization may be required for reimbursement for recommended care.  Comment: Need to confirm social supports. She will need assistance as noted above at discharge.   Ian Malkin  Riley Kill, MD 05/09/2011

## 2011-05-09 NOTE — Progress Notes (Signed)
Rehab admissions - Evaluated for possible admission.  I spoke with patient, husband, and sister-in-law.  Patient would like inpatient rehab prior to home.  Plans to have friend come stay with patient providing 24 hour supervision.  Work-up on going.  I have called and faxed information to [facility] insurance asking for possible inpatient rehab admission late this afternoon.  Will follow-up after I hear back from insurance case manager.  Pager [phone]

## 2011-05-09 NOTE — Progress Notes (Signed)
Inpatient Diabetes Program Recommendations  AACE/ADA: New Consensus Statement on Inpatient Glycemic Control (2009)  Target Ranges:  Prepandial:   less than 140 mg/dL      Peak postprandial:   less than 180 mg/dL (1-2 hours)      Critically ill patients:  140 - 180 mg/dL   Reason for Visit: Hyperglycemia  Inpatient Diabetes Program Recommendations Insulin - Basal: Add 10 units Lantus while here. Correction (SSI): CBG's still high.  Please increase to resistant tidwc and add HS scale. HgbA1C: High at 10.2% . Pt will need Lantus at discharge as well.  If to be discharged to home, pt will need insulin instruction per pen or vial.    Note: Please order insulin instruction for discharge if needed. Thank you, Lenor Coffin, RN, CNS, Diabetes Coordinator [phone])

## 2011-05-09 NOTE — Progress Notes (Signed)
Patient ID: Chelsey Gallagher, female   DOB: June 21, 1950, 61 y.o.   MRN: [id number]  Subjective: No events overnight. Patient denies chest pain, shortness of breath, abdominal pain.  Objective:  Vital signs in last 24 hours:  Filed Vitals:   05/09/11 0929 05/09/11 1000 05/09/11 1400 05/09/11 1800  BP: 198/79 168/78 162/85 132/72  Pulse: 68  73 59  Temp: 98.4 F (36.9 C)  97.7 F (36.5 C) 98.3 F (36.8 C)  TempSrc: Oral  Oral Oral  Resp: 18  18 18   Height:      Weight:      SpO2: 93%  92% 97%    Intake/Output from previous day:   Intake/Output Summary (Last 24 hours) at 05/09/11 2016 Last data filed at 05/09/11 0612  Gross per 24 hour  Intake    610 ml  Output      0 ml  Net    610 ml    Physical Exam: General: Alert, awake, oriented x3, in no acute distress. HEENT: No bruits, no goiter. Moist mucous membranes, no scleral icterus, no conjunctival pallor. Heart: Regular rate and rhythm, S1/S2 +, no murmurs, rubs, gallops. Lungs: Clear to auscultation bilaterally. No wheezing, no rhonchi, no rales.  Abdomen: Soft, nontender, nondistended, positive bowel sounds. Extremities: No clubbing or cyanosis, no pitting edema,  positive pedal pulses. Neuro: Slurred speech, now able to mover right lower extremity against gravity, no movement in right upper extremity  Lab Results:  Basic Metabolic Panel:    Component Value Date/Time   NA 131* 05/09/2011 0632   K 3.8 05/09/2011 0632   CL 97 05/09/2011 0632   CO2 26 05/09/2011 0632   BUN 12 05/09/2011 0632   CREATININE 0.48* 05/09/2011 0632   GLUCOSE 226* 05/09/2011 0632   CALCIUM 9.2 05/09/2011 0632   CBC:    Component Value Date/Time   WBC 8.6 05/09/2011 0632   HGB 16.2* 05/09/2011 0632   HCT 47.8* 05/09/2011 0632   PLT 155 05/09/2011 0632   MCV 91.6 05/09/2011 0632   NEUTROABS 5.3 05/08/2011 0219   LYMPHSABS 1.2 05/08/2011 0219   MONOABS 0.4 05/08/2011 0219   EOSABS 0.0 05/08/2011 0219   BASOSABS 0.0 05/08/2011 0219      Lab 05/09/11 0632  05/08/11 0221 05/08/11 0219  WBC 8.6 -- 7.0  HGB 16.2* 17.7* 17.7*  HCT 47.8* 52.0* 49.5*  PLT 155 -- 141*  MCV 91.6 -- 89.5  MCH 31.0 -- 32.0  MCHC 33.9 -- 35.8  RDW 14.0 -- 13.6  LYMPHSABS -- -- 1.2  MONOABS -- -- 0.4  EOSABS -- -- 0.0  BASOSABS -- -- 0.0  BANDABS -- -- --    Lab 05/09/11 0632 05/08/11 1227 05/08/11 0221 05/08/11 0219  NA 131* 130* 132* 130*  K 3.8 4.1 4.0 4.1  CL 97 95* 98 92*  CO2 26 27 -- 26  GLUCOSE 226* 275* 314* 309*  BUN 12 15 16 14   CREATININE 0.48* 0.52 0.40* 0.48*  CALCIUM 9.2 9.4 -- 10.1  MG -- -- -- --    Lab 05/08/11 0219  INR 0.91  PROTIME --   Cardiac markers:  Lab 05/08/11 0219  CKMB 2.2  TROPONINI <0.30  MYOGLOBIN --   No components found with this basename: POCBNP:3 No results found for this or any previous visit (from the past 240 hour(s)).  Studies/Results: Ct Angio Head W/cm &/or Wo Cm  05/09/2011  *RADIOLOGY REPORT*  Clinical Data:  Right-sided weakness with slurred speech and facial droop.  High blood pressure.  Diabetes.  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  Question dissection?  CT ANGIOGRAPHY HEAD AND NECK  Technique:  Multidetector CT imaging of the head and neck was performed using the standard protocol during bolus administration of intravenous contrast.  Multiplanar CT image reconstructions including MIPs were obtained to evaluate the vascular anatomy. Carotid stenosis measurements (when applicable) are obtained utilizing NASCET criteria, using the distal internal carotid diameter as the denominator.  Contrast: 50mL OMNIPAQUE IOHEXOL 350 MG/ML IV SOLN  Comparison:   None.  CTA NECK  Findings:  Normal origin of great vessels from the aortic arch. Prominent shaggy plaque of the undersurface of the aortic arch.  Plaque with mild narrowing right common carotid artery.  Proximal right internal carotid artery with irregular plaque and ulceration.  76% diameter stenosis.  Distal right internal carotid  artery calcified plaque with mild narrowing.  Plaque left common carotid artery with mild narrowing.  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 It is possible there is a tiny string sign at this level although not appreciated on axial thin-section image 139.  Beyond this region, the left internal carotid artery is of marked decreased caliber and significantly irregular.  As this arises from the prominent complex plaque, this is probably related to atherosclerosis rather than dissection.  Moderate irregular plaque with mild to moderate narrowing left subclavian artery.  Mild to moderate narrowing proximal left vertebral artery.  Mild to moderate narrowing of the portions of the cervical segment of the left vertebral artery.  At the C1 level, the left vertebral is occluded.  This may be related to a dissection and / or atherosclerotic type changes.  Proximal right subclavian artery shaggy plaque with mild to moderate narrowing.  Prominent plaque proximal right vertebral artery with moderate to marked narrowing.  Plaque along the cervical segment of the right vertebral artery without high-grade stenosis.  Thyroid goiter wraps around posteriorly.  No focal dominant mass identified.  Ground-glass opacity right upper lobe.  Follow-up chest CT in 3 months recommended.  Posterior to the angle of the left mandible there is a necrotic appearing 1.5 x 1.3 x 2.2 cm node suspicious for malignancy. Primary source is not identified.  ENT consultation with direct visualization to exclude mucosal abnormality may be considered.  Polypoid opacification inferior aspect right maxillary sinus. Dental caries / peri apical lucency more notable upper right teeth.   Review of the MIP images confirms the above findings.  IMPRESSION: Posterior to the angle of the left mandible there is a necrotic appearing 1.5 x 1.3 x 2.2 cm node suspicious for malignancy. Primary source is not identified.  ENT consultation with direct  visualization to exclude mucosal abnormality may be considered.  Ground-glass opacity right upper lobe.  Follow-up chest CT in 3 months recommended.  Proximal right internal carotid artery with irregular plaque and ulceration.  76% diameter stenosis.  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 It is possible there is a tiny string sign at this level although not appreciated on axial thin-section image 139.  Beyond this region, the left internal carotid artery is of marked decreased caliber and significantly irregular.  As this arises from the prominent complex plaque, this is probably related to atherosclerosis rather than dissection.  At the C1 level, the left vertebral is occluded.  This may be related to a dissection and / or atherosclerotic type changes.  Prominent plaque proximal right vertebral artery with moderate to marked  narrowing.  CTA HEAD  Findings:  MR detected left paracentral medulla infarct and tiny left cerebellar infarct not well delineated on the current CT. Small vessel disease type changes.  No hydrocephalus or intracranial enhancing lesion.  No intracranial hemorrhage.  Left internal carotid artery pre cavernous and cavernous segment are significantly narrowed.  This is felt to represent combination of atherosclerotic type changes and proximal stenosis.  Right internal carotid artery cavernous segment plaque with mild to slightly moderate irregularity.   Mild ectasia of the right carotid terminus. No discrete aneurysm.  Left vertebral artery is occluded.  Mild narrowing and irregularity of the basilar artery most notable mid aspect.   Review of the MIP images confirms the above findings.  IMPRESSION: Left vertebral artery is occluded.  Mild narrowing and irregularity of the basilar artery most notable mid aspect.  Left internal carotid artery pre cavernous and cavernous segment are significantly narrowed.  This is felt to represent combination of atherosclerotic type  changes and proximal stenosis.  Please see above.  Original Report Authenticated By: Fuller Canada, M.D.   Dg Chest 2 View  05/08/2011  *RADIOLOGY REPORT*  Clinical Data: Stroke, right-sided weakness, shortness of breath  CHEST - 2 VIEW  Comparison: None.  Findings: Lungs are essentially clear. No pleural effusion or pneumothorax.  Cardiomediastinal silhouette is within normal limits.  Degenerative changes of the visualized thoracolumbar spine.  Surgical clips in the right upper abdomen.  IMPRESSION: No evidence of acute cardiopulmonary disease.  Original Report Authenticated By: Charline Bills, M.D.   Ct Head Wo Contrast  05/08/2011  *RADIOLOGY REPORT*  Clinical Data:  Code stroke, right side weakness  CT HEAD WITHOUT CONTRAST  Technique:  Contiguous axial images were obtained from the base of the skull through the vertex without contrast.  Comparison: None  Findings: Normal ventricular morphology. No midline shift or mass effect. Normal appearance of brain parenchyma. No intracranial hemorrhage, mass lesion or evidence of acute infarction. No extra-axial fluid collections. Atherosclerotic calcification within internal carotid arteries at skull base bilaterally. Visualized paranasal sinuses and mastoid air cells clear. Bones unremarkable.  IMPRESSION: No acute intracranial abnormalities.  These results were called by telephone on 05/08/2011  at  0230 hrs to  Dr. Rubin Payor, who verbally acknowledged these results.  Original Report Authenticated By: Lollie Marrow, M.D.   Ct Angio Neck W/cm &/or Wo/cm  05/09/2011  *RADIOLOGY REPORT*  Clinical Data:  Right-sided weakness with slurred speech and facial droop.  High blood pressure.  Diabetes.  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  Question dissection?  CT ANGIOGRAPHY HEAD AND NECK  Technique:  Multidetector CT imaging of the head and neck was performed using the standard protocol during bolus administration of intravenous  contrast.  Multiplanar CT image reconstructions including MIPs were obtained to evaluate the vascular anatomy. Carotid stenosis measurements (when applicable) are obtained utilizing NASCET criteria, using the distal internal carotid diameter as the denominator.  Contrast: 50mL OMNIPAQUE IOHEXOL 350 MG/ML IV SOLN  Comparison:   None.  CTA NECK  Findings:  Normal origin of great vessels from the aortic arch. Prominent shaggy plaque of the undersurface of the aortic arch.  Plaque with mild narrowing right common carotid artery.  Proximal right internal carotid artery with irregular plaque and ulceration.  76% diameter stenosis.  Distal right internal carotid artery calcified plaque with mild narrowing.  Plaque left common carotid artery with mild narrowing.  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 It is  possible there is a tiny string sign at this level although not appreciated on axial thin-section image 139.  Beyond this region, the left internal carotid artery is of marked decreased caliber and significantly irregular.  As this arises from the prominent complex plaque, this is probably related to atherosclerosis rather than dissection.  Moderate irregular plaque with mild to moderate narrowing left subclavian artery.  Mild to moderate narrowing proximal left vertebral artery.  Mild to moderate narrowing of the portions of the cervical segment of the left vertebral artery.  At the C1 level, the left vertebral is occluded.  This may be related to a dissection and / or atherosclerotic type changes.  Proximal right subclavian artery shaggy plaque with mild to moderate narrowing.  Prominent plaque proximal right vertebral artery with moderate to marked narrowing.  Plaque along the cervical segment of the right vertebral artery without high-grade stenosis.  Thyroid goiter wraps around posteriorly.  No focal dominant mass identified.  Ground-glass opacity right upper lobe.  Follow-up chest CT in  3 months recommended.  Posterior to the angle of the left mandible there is a necrotic appearing 1.5 x 1.3 x 2.2 cm node suspicious for malignancy. Primary source is not identified.  ENT consultation with direct visualization to exclude mucosal abnormality may be considered.  Polypoid opacification inferior aspect right maxillary sinus. Dental caries / peri apical lucency more notable upper right teeth.   Review of the MIP images confirms the above findings.  IMPRESSION: Posterior to the angle of the left mandible there is a necrotic appearing 1.5 x 1.3 x 2.2 cm node suspicious for malignancy. Primary source is not identified.  ENT consultation with direct visualization to exclude mucosal abnormality may be considered.  Ground-glass opacity right upper lobe.  Follow-up chest CT in 3 months recommended.  Proximal right internal carotid artery with irregular plaque and ulceration.  76% diameter stenosis.  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 It is possible there is a tiny string sign at this level although not appreciated on axial thin-section image 139.  Beyond this region, the left internal carotid artery is of marked decreased caliber and significantly irregular.  As this arises from the prominent complex plaque, this is probably related to atherosclerosis rather than dissection.  At the C1 level, the left vertebral is occluded.  This may be related to a dissection and / or atherosclerotic type changes.  Prominent plaque proximal right vertebral artery with moderate to marked narrowing.  CTA HEAD  Findings:  MR detected left paracentral medulla infarct and tiny left cerebellar infarct not well delineated on the current CT. Small vessel disease type changes.  No hydrocephalus or intracranial enhancing lesion.  No intracranial hemorrhage.  Left internal carotid artery pre cavernous and cavernous segment are significantly narrowed.  This is felt to represent combination of atherosclerotic  type changes and proximal stenosis.  Right internal carotid artery cavernous segment plaque with mild to slightly moderate irregularity.   Mild ectasia of the right carotid terminus. No discrete aneurysm.  Left vertebral artery is occluded.  Mild narrowing and irregularity of the basilar artery most notable mid aspect.   Review of the MIP images confirms the above findings.  IMPRESSION: Left vertebral artery is occluded.  Mild narrowing and irregularity of the basilar artery most notable mid aspect.  Left internal carotid artery pre cavernous and cavernous segment are significantly narrowed.  This is felt to represent combination of atherosclerotic type changes and proximal stenosis.  Please see above.  Original Report Authenticated By: Fuller Canada, M.D.   Mr Brain Wo Contrast  05/08/2011  *RADIOLOGY REPORT*  Clinical Data:  Right-sided weakness with slurred speech and facial droop. High blood pressure.  Diabetes.  MRI HEAD WITHOUT CONTRAST MRA HEAD WITHOUT CONTRAST  Technique: Multiplanar, multiecho pulse sequences of the brain and surrounding structures were obtained according to standard protocol without intravenous contrast.  Angiographic images of the head were obtained using MRA technique without contrast.  Comparison: 05/08/2011 head CT.  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 Tiny acute non hemorrhagic posterior left cerebellar infarct.  Mild to moderate nonspecific white matter type changes most consistent with result of small vessel disease in this hypertensive diabetic patient.  This involves the subcortical periventricular region as well as a central pons.  Cava septum pellucidum et vergae incidentally noted.  No hydrocephalus.  Abnormal appearance of the left vertebral artery and left carotid artery.  Please see below.  Polypoid opacification inferior aspect right maxillary sinus. Minimal paranasal sinus mucosal thickening.  No intracranial mass lesion  detected on this unenhanced motion degraded exam.  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  Tiny acute non hemorrhagic posterior left cerebellar infarct.  Please see above.  MRA HEAD  Findings: Exam limited by motion.  Left internal carotid artery is small in caliber.  This may be related to proximal stenosis.  Superimposed atherosclerotic type changes (dissection not excluded).  Nonvisualization left vertebral artery and left PICA.  This may be related to the occlusion of atherosclerosis.  Dissection not excluded.  Ectatic right internal carotid artery.  Small bulge proximal cavernous segment may be related to atherosclerosis rather than aneurysm.  Stability can be confirmed on follow-up.  Prominent ectasia right carotid terminus.  No definitive aneurysm. Evaluation limited by motion.  Mild to moderate narrowing of the M1 segment of the right middle cerebral artery.  Mild to moderate narrowing M1 segment left middle cerebral artery.  Hypoplastic versus markedly diseased A1 segment of the left anterior cerebral artery.  Middle cerebral artery moderate branch vessel irregularity.  Mild narrowing distal right vertebral artery.  Mild to moderate irregularity with areas of mild to moderate narrowing basilar artery.  Nonvisualization right AICA.  Fetal type origin of the right posterior cerebral artery.  Moderate to marked narrowing involving portions of the posterior cerebral artery bilaterally.  IMPRESSION: Left internal carotid artery is small in caliber.  This may be related to proximal stenosis.  Superimposed atherosclerotic type changes (dissection not excluded).  Nonvisualization left vertebral artery and left PICA.  This may be related to the occlusion of atherosclerosis.  Dissection not excluded.  Please see above.  Original Report Authenticated By: Fuller Canada, M.D.   Mr Mra Head/brain Wo Cm  05/08/2011  *RADIOLOGY REPORT*  Clinical Data:  Right-sided weakness with slurred  speech and facial droop. High blood pressure.  Diabetes.  MRI HEAD WITHOUT CONTRAST MRA HEAD WITHOUT CONTRAST  Technique: Multiplanar, multiecho pulse sequences of the brain and surrounding structures were obtained according to standard protocol without intravenous contrast.  Angiographic images of the head were obtained using MRA technique without contrast.  Comparison: 05/08/2011 head CT.  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 Tiny acute non hemorrhagic posterior left cerebellar infarct.  Mild to moderate nonspecific white matter type changes most consistent with result of small vessel disease in this hypertensive diabetic patient.  This involves the subcortical periventricular region as well as a  central pons.  Cava septum pellucidum et vergae incidentally noted.  No hydrocephalus.  Abnormal appearance of the left vertebral artery and left carotid artery.  Please see below.  Polypoid opacification inferior aspect right maxillary sinus. Minimal paranasal sinus mucosal thickening.  No intracranial mass lesion detected on this unenhanced motion degraded exam.  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  Tiny acute non hemorrhagic posterior left cerebellar infarct.  Please see above.  MRA HEAD  Findings: Exam limited by motion.  Left internal carotid artery is small in caliber.  This may be related to proximal stenosis.  Superimposed atherosclerotic type changes (dissection not excluded).  Nonvisualization left vertebral artery and left PICA.  This may be related to the occlusion of atherosclerosis.  Dissection not excluded.  Ectatic right internal carotid artery.  Small bulge proximal cavernous segment may be related to atherosclerosis rather than aneurysm.  Stability can be confirmed on follow-up.  Prominent ectasia right carotid terminus.  No definitive aneurysm. Evaluation limited by motion.  Mild to moderate narrowing of the M1 segment of the right  middle cerebral artery.  Mild to moderate narrowing M1 segment left middle cerebral artery.  Hypoplastic versus markedly diseased A1 segment of the left anterior cerebral artery.  Middle cerebral artery moderate branch vessel irregularity.  Mild narrowing distal right vertebral artery.  Mild to moderate irregularity with areas of mild to moderate narrowing basilar artery.  Nonvisualization right AICA.  Fetal type origin of the right posterior cerebral artery.  Moderate to marked narrowing involving portions of the posterior cerebral artery bilaterally.  IMPRESSION: Left internal carotid artery is small in caliber.  This may be related to proximal stenosis.  Superimposed atherosclerotic type changes (dissection not excluded).  Nonvisualization left vertebral artery and left PICA.  This may be related to the occlusion of atherosclerosis.  Dissection not excluded.  Please see above.  Original Report Authenticated By: Fuller Canada, M.D.    Medications: Scheduled Meds:   . aspirin  300 mg Rectal Daily   Or  . aspirin  325 mg Oral Daily  . citalopram  20 mg Oral Daily  . enoxaparin  40 mg Subcutaneous Q24H  . gabapentin  300 mg Oral Q supper  . gabapentin  600 mg Oral QHS  . insulin aspart  0-15 Units Subcutaneous TID WC  . lisinopril  20 mg Oral Daily  . rOPINIRole  3 mg Oral QHS  . simvastatin  20 mg Oral q1800   Continuous Infusions:   . sodium chloride 50 mL/hr (05/09/11 0612)   PRN Meds:.acetaminophen, acetaminophen, iohexol, labetalol, methocarbamol, ondansetron (ZOFRAN) IV, senna-docusate  Assessment/Plan:  Principal Problem:  *CVA (cerebral infarction)  - stroke service following  - PT/OT/SLP in progress and will follow up on recommendations  - continue with stroke work up including carotid dopplers, 2 D ECHO  - ensure BP and diabetes control  - lipid panel pending   Active Problems:  Hypertension  - remains stable  - continue to monitor vitals per floor protocol    Diabetes mellitus  - check A1C and adjust the regimen as indicated   EDUCATION  - test results and diagnostic studies were discussed with patient and pt's family who was present at the bedside (daughter) - patient and family have verbalized the understanding  - questions were answered at the bedside and contact information was provided for additional questions or concerns    LOS: 1 day   MAGICK-Chisom Aust 05/09/2011, 8:16 PM  [facility] Pager: [phone]

## 2011-05-10 LAB — GLUCOSE, CAPILLARY
Glucose-Capillary: 213 mg/dL — ABNORMAL HIGH (ref 70–99)
Glucose-Capillary: 155 mg/dL — ABNORMAL HIGH (ref 70–99)

## 2011-05-10 LAB — BASIC METABOLIC PANEL
BUN: 11 mg/dL (ref 6–23)
Chloride: 96 mEq/L (ref 96–112)
GFR calc Af Amer: >90 mL/min (ref >90)
GFR calc non Af Amer: >90 mL/min (ref >90)
Potassium: 3.6 mEq/L (ref 3.5–5.1)
Sodium: 132 mEq/L — ABNORMAL LOW (ref 135–145)

## 2011-05-10 LAB — CBC
HCT: 46.9 % — ABNORMAL HIGH (ref 36.0–46.0)
MCHC: 35 g/dL (ref 30.0–36.0)
Platelets: 144 10*3/uL — ABNORMAL LOW (ref 150–400)
RDW: 13.9 % (ref 11.5–15.5)
WBC: 7.2 10*3/uL (ref 4.0–10.5)

## 2011-05-10 MED ORDER — GLUCERNA SHAKE PO LIQD
237.0000 mL | Freq: Three times a day (TID) | ORAL | Status: DC
  Administered 2011-05-10 – 2011-05-13 (×4): 237 mL via ORAL

## 2011-05-10 MED ORDER — LISINOPRIL 40 MG PO TABS
40.0000 mg | ORAL_TABLET | Freq: Every day | ORAL | Status: DC
  Administered 2011-05-11 – 2011-05-14 (×4): 40 mg via ORAL
  Filled 2011-05-10 (×4): qty 1

## 2011-05-10 MED ORDER — ATORVASTATIN CALCIUM 40 MG PO TABS
40.0000 mg | ORAL_TABLET | Freq: Every day | ORAL | Status: DC
  Administered 2011-05-10 – 2011-05-13 (×4): 40 mg via ORAL
  Filled 2011-05-10 (×6): qty 1

## 2011-05-10 MED ORDER — INSULIN GLARGINE 100 UNIT/ML ~~LOC~~ SOLN
15.0000 [IU] | Freq: Every day | SUBCUTANEOUS | Status: DC
  Administered 2011-05-10 – 2011-05-13 (×3): 15 [IU] via SUBCUTANEOUS
  Filled 2011-05-10: qty 3

## 2011-05-10 MED ORDER — LISINOPRIL 40 MG PO TABS
40.0000 mg | ORAL_TABLET | ORAL | Status: AC
  Administered 2011-05-10: 40 mg via ORAL
  Filled 2011-05-10: qty 1

## 2011-05-10 NOTE — Progress Notes (Addendum)
INITIAL ADULT NUTRITION ASSESSMENT Date: 05/10/2011   Time: 12:44 PM  Reason for Assessment: Consult  ASSESSMENT: Female 61 y.o.  Dx: CVA (cerebral infarction)  Hx:  Past Medical History  Diagnosis Date  . Diabetes mellitus   . Hypertension   . Depression   . PAD (peripheral artery disease)     S/p bypass grafting, unclear exactly where  . Nephrolithiasis     Related Meds:     . aspirin  300 mg Rectal Daily   Or  . aspirin  325 mg Oral Daily  . atorvastatin  40 mg Oral q1800  . citalopram  20 mg Oral Daily  . enoxaparin  40 mg Subcutaneous Q24H  . gabapentin  600 mg Oral QHS  . insulin aspart  0-15 Units Subcutaneous TID WC  . insulin glargine  15 Units Subcutaneous QHS  . lisinopril  40 mg Oral Daily  . lisinopril  40 mg Oral STAT  . rOPINIRole  3 mg Oral QHS  . DISCONTD: gabapentin  300 mg Oral Q supper  . DISCONTD: lisinopril  20 mg Oral Daily  . DISCONTD: simvastatin  20 mg Oral q1800    Ht: 5\' 3"  (160 cm)  Wt: 147 lb 14.4 oz (67.087 kg)  Ideal Wt: 52.2 kg % Ideal Wt: 128%  Usual Wt: unknown % Usual Wt: ---  Body mass index is 26.20 kg/(m^2).  Food/Nutrition Related Hx: no triggers per admission nutrition screen  Labs:  CMP     Component Value Date/Time   NA 132* 05/10/2011 0600   K 3.6 05/10/2011 0600   CL 96 05/10/2011 0600   CO2 26 05/10/2011 0600   GLUCOSE 184* 05/10/2011 0600   BUN 11 05/10/2011 0600   CREATININE 0.43* 05/10/2011 0600   CALCIUM 9.4 05/10/2011 0600   PROT 7.6 05/08/2011 0219   ALBUMIN 3.7 05/08/2011 0219   AST 19 05/08/2011 0219   ALT 21 05/08/2011 0219   ALKPHOS 104 05/08/2011 0219   BILITOT 0.5 05/08/2011 0219   GFRNONAA >90 05/10/2011 0600   GFRAA >90 05/10/2011 0600    I/O last 3 completed shifts: In: 610 [I.V.:610] Out: -  Total I/O In: 240 [P.O.:240] Out: -   CBG (last 3)   Basename 05/10/11 1147 05/10/11 0810 05/10/11 0634  GLUCAP 213* 210* 183*    Diet Order: Dysphagia 3, thin liquids -- bedside swallow evaluation completed  3/7  Supplements/Tube Feeding: N/A  IVF:    DISCONTD: sodium chloride Last Rate: 50 mL/hr (05/09/11 0612)    Estimated Nutritional Needs:   Kcal: 1700-1900 Protein: 80-90 gm Fluid: 1.7-1.9 L  RD briefly spoke with pt and pt's family re: nutrition hx -- pt reports her appetite has been poor since this hospitalization; current PO intake at 15% per flowsheet records; pt does not know if she's lost weight or not; pt with visible muscle loss to thighs & calfs; RD suspects some level of malnutrition; noted Stage 1 pressure ulcer presence per flowsheet records; pt would benefit from addition of supplement -- amenable to Glucerna Shake -- RD to order  RD provided handouts on Carbohydrate Counting For People With Diabetes to pt's family -- not appropriate for review of materials at this time -- per RN, family is "in a bad place" after finding out CTA results  NUTRITION DIAGNOSIS: -Inadequate oral intake (NI-2.1).  Status: Ongoing  RELATED TO: poor appetite  AS EVIDENCE BY: PO intake 15%  MONITORING/EVALUATION(Goals): Goal: meet >90% of estimated nutrition needs to optimize nutritional status Monitor:  PO intake, labs, weight, I/O's  EDUCATION NEEDS: -Education not appropriate at this time  INTERVENTION:  Add Glucerna Shake PO TID (220 kcals, 9.9 gm protein per 8 fl oz bottle)  Review DM education materials at more appropriate time  RD to follow for nutrition care plan  Dietitian #: [phone]  DOCUMENTATION CODES Per approved criteria  -Not Applicable    Alger Memos 05/10/2011, 12:44 PM

## 2011-05-10 NOTE — Progress Notes (Signed)
History: Chelsey Gallagher is an 61 y.o. female who reports that she laid down about 10pm and felt fine at that time. Got up about 1015 and felt nauseous and dizzy. Had some vomiting. Looked in the mirror and noted that she had a right facial droop. Then noted numbness in her right upper extremity. It went to her right lower extremity and then to the right ear. She attempted to call her daughter but had difficulty managing to et to the phone and then dial. When speaking noticed that her speech was slurred. Her daughter called EMS. EMS initiated the code stroke. She presented beyond IV TPA window.   LSN: [id number]  tPA Given: No: Outside time window  mRankin: 0 Interval: Baseline (03/07 0243) Level of Consciousness (1a. ): Alert, keenly responsive (03/08 2000) LOC Questions (1b. ): Answers both questions correctly (03/08 2000) LOC Commands (1c. ): Performs both tasks correctly (03/08 2000) Best Gaze (2. ): Normal (03/08 2000) Visual (3. ): No visual loss (03/08 2000) Facial Palsy (4. ): Minor paralysis (03/08 2000) Motor Arm, Left (5a. ): No drift (03/08 2000) Motor Arm, Right (5b. ): No movement (03/08 2000) Motor Leg, Left (6a. ): No drift (03/08 2000) Motor Leg, Right (6b. ): Some effort against gravity (03/08 2000) Limb Ataxia (7. ): Present in one limb (03/08 2000) Sensory (8. ): Mild-to-moderate sensory loss, patient feels pinprick is less sharp or is dull on the affected side, or there is a loss of superficial pain with pinprick, but patient is aware of being touched (03/08 2000) Best Language (9. ): No aphasia (03/08 2000) Dysarthria (10. ): Mild-to-moderate dysarthria, patient slurs at least some words and, at worst, can be understood with some difficulty (03/08 2000) Inattention/Extinction: No Abnormality (03/08 2000) Total: 10  (03/08 2000)  Subjective: With patch off OS, slightly improved diplopla.  Does get worse sitting up.  Speech improving, unable to use R UE.  Can move RLE a  little.  Dtr at bedside.  Objective: BP 144/82  Pulse 61  Temp(Src) 97.7 F (36.5 C) (Oral)  Resp 20  Ht 5\' 3"  (1.6 m)  Wt 67.087 kg (147 lb 14.4 oz)  BMI 26.20 kg/m2  SpO2 94% Telemetry:  CBGs  Basename 05/10/11 0810 05/10/11 0634 05/09/11 2249 05/09/11 1706 05/09/11 1153 05/09/11 0708 05/08/11 2147 05/08/11 1744 05/08/11 1138 05/08/11 0755  GLUCAP 210* 183* 148* 155* 239* 231* 239* 174* 321* 296*   Diet: D3, thin  Activity: up with assistance  DVT Prophylaxis: lovenox  Medications: Scheduled:   . aspirin  300 mg Rectal Daily   Or  . aspirin  325 mg Oral Daily  . citalopram  20 mg Oral Daily  . enoxaparin  40 mg Subcutaneous Q24H  . gabapentin  300 mg Oral Q supper  . gabapentin  600 mg Oral QHS  . insulin aspart  0-15 Units Subcutaneous TID WC  . lisinopril  20 mg Oral Daily  . rOPINIRole  3 mg Oral QHS  . simvastatin  20 mg Oral q1800   Neurologic Exam: Mental Status: Alert, oriented, thought content appropriate.  Speech fluent without evidence of aphasia. Mild dysarthria.   Able to follow 3 step commands without difficulty. Cranial Nerves: II- Visual fields grossly intact, with both eyes open, has vertical diploplia.  III/IV/VI-Extraocular movements intact.  Nystagmus prominent with left lateral gaze.  Pupils reactive bilaterally. V/VII-Smile symmetric VIII-hearing grossly intact IX/X-normal gag XI-bilateral shoulder shrug XII-midline tongue extension Motor: 1/5 RUE, increased tone, 5/5 LUE normal  tone, 3-4/5 RLE with increased tone, 5/5 LLE. Sensory: diminished sensation on the right. Deep Tendon Reflexes: 2+ and symmetric throughout Plantars: upgoing on the right Cerebellar: Normal finger-to-nose L, normal rapid alternating movements L.  Unable to perform on the right.  Lab Results: Basic Metabolic Panel:  Lab 05/10/11 1610 05/09/11 0632  NA 132* 131*  K 3.6 3.8  CL 96 97  CO2 26 26  GLUCOSE 184* 226*  BUN 11 12  CREATININE 0.43* 0.48*  CALCIUM  9.4 9.2  MG -- --  PHOS -- --   Liver Function Tests:  Lab 05/08/11 0219  AST 19  ALT 21  ALKPHOS 104  BILITOT 0.5  PROT 7.6  ALBUMIN 3.7   CBC:  Lab 05/10/11 0600 05/09/11 0632 05/08/11 0219  WBC 7.2 8.6 --  NEUTROABS -- -- 5.3  HGB 16.4* 16.2* --  HCT 46.9* 47.8* --  MCV 90.4 91.6 --  PLT 144* 155 --   Cardiac Enzymes:  Lab 05/08/11 0219  CKTOTAL 50  CKMB 2.2  CKMBINDEX --  TROPONINI <0.30   CBG:  Lab 05/10/11 0810 05/10/11 0634 05/09/11 2249 05/09/11 1706 05/09/11 1153 05/09/11 0708  GLUCAP 210* 183* 148* 155* 239* 231*   Hemoglobin A1C:  Lab 05/08/11 1227  HGBA1C 10.2*   Fasting Lipid Panel:  Lab 05/08/11 1230  CHOL 216*  HDL 25*  LDLCALC 135*  TRIG 279*  CHOLHDL 8.6  LDLDIRECT --   Coagulation:  Lab 05/08/11 0219  LABPROT 12.5  INR 0.91   Anemia Panel:  Lab 05/08/11 1227  VITAMINB12 --  FOLATE --  FERRITIN 115  TIBC Not calculated due to Iron <10.  IRON <10*  RETICCTPCT --   Study Results: 05/09/2011  CT ANGIOGRAPHY  NECK  Findings:  Normal origin of great vessels from the aortic arch. Prominent shaggy plaque of the undersurface of the aortic arch.  Plaque with mild narrowing right common carotid artery.  Proximal right internal carotid artery with irregular plaque and ulceration.  76% diameter stenosis.  Distal right internal carotid artery calcified plaque with mild narrowing.  Plaque left common carotid artery with mild narrowing.  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 It is possible there is a tiny string sign at this level although not appreciated on axial thin-section image 139.  Beyond this region, the left internal carotid artery is of marked decreased caliber and significantly irregular.  As this arises from the prominent complex plaque, this is probably related to atherosclerosis rather than dissection.  Moderate irregular plaque with mild to moderate narrowing left subclavian artery.  Mild to moderate  narrowing proximal left vertebral artery.  Mild to moderate narrowing of the portions of the cervical segment of the left vertebral artery.  At the C1 level, the left vertebral is occluded.  This may be related to a dissection and / or atherosclerotic type changes.  Proximal right subclavian artery shaggy plaque with mild to moderate narrowing.  Prominent plaque proximal right vertebral artery with moderate to marked narrowing.  Plaque along the cervical segment of the right vertebral artery without high-grade stenosis.  Thyroid goiter wraps around posteriorly.  No focal dominant mass identified.  Ground-glass opacity right upper lobe.  Follow-up chest CT in 3 months recommended.  Posterior to the angle of the left mandible there is a necrotic appearing 1.5 x 1.3 x 2.2 cm node suspicious for malignancy. Primary source is not identified.  ENT consultation with direct visualization to exclude mucosal abnormality may be considered.  Polypoid opacification inferior aspect right maxillary sinus. Dental caries / peri apical lucency more notable upper right teeth.   Review of the MIP images confirms the above findings.  IMPRESSION:  1. Posterior to the angle of the left mandible there is a necrotic appearing 1.5 x 1.3 x 2.2 cm node suspicious for malignancy. Primary source is not identified.  ENT consultation with direct visualization to exclude mucosal abnormality may be considered.  2. Ground-glass opacity right upper lobe.  Follow-up chest CT in 3 months recommended.  3. Proximal right internal carotid artery with irregular plaque and ulceration.  76% diameter stenosis.   4. 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 It is possible there is a tiny string sign at this level although not appreciated on axial thin-section image 139. 5.  Beyond this region, the left internal carotid artery is of marked decreased caliber and significantly irregular.  As this arises from the prominent complex  plaque, this is probably related to atherosclerosis rather than dissection.   6. At the C1 level, the left vertebral is occluded.  This may be related to a dissection and / or atherosclerotic type changes.   7.Prominent plaque proximal right vertebral artery with moderate to marked narrowing.  Fuller Canada, M.D.  05/09/11 CTA HEAD  Findings:  MR detected left paracentral medulla infarct and tiny left cerebellar infarct not well delineated on the current CT. Small vessel disease type changes.  No hydrocephalus or intracranial enhancing lesion.  No intracranial hemorrhage.  Left internal carotid artery pre cavernous and cavernous segment are significantly narrowed.  This is felt to represent combination of atherosclerotic type changes and proximal stenosis.  Right internal carotid artery cavernous segment plaque with mild to slightly moderate irregularity.   Mild ectasia of the right carotid terminus. No discrete aneurysm.  Left vertebral artery is occluded.  Mild narrowing and irregularity of the basilar artery most notable mid aspect.   Review of the MIP images confirms the above findings.  IMPRESSION: Left vertebral artery is occluded.  Mild narrowing and irregularity of the basilar artery most notable mid aspect.  Left internal carotid artery pre cavernous and cavernous segment are significantly narrowed.  This is felt to represent combination of atherosclerotic type changes and proximal stenosis.  Fuller Canada, M.D.  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 Tiny acute non hemorrhagic posterior left cerebellar infarct. Mild to moderate nonspecific white matter type changes most consistent with result of small vessel disease in this hypertensive diabetic patient. This involves the subcortical periventricular region as well as a central pons. Cava septum pellucidum et vergae incidentally noted. No hydrocephalus. Abnormal appearance of the left vertebral artery and  left carotid artery. Please see below. Polypoid opacification inferior aspect right maxillary sinus. Minimal paranasal sinus mucosal thickening. No intracranial mass lesion detected on this unenhanced motion degraded exam.  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 Tiny acute non hemorrhagic posterior left cerebellar infarct.  Fuller Canada, M.D.  05/09/11 MRA HEAD Findings: Exam limited by motion. Left internal carotid artery is small in caliber. This may be related to proximal stenosis. Superimposed atherosclerotic type changes (dissection not excluded).  Nonvisualization left vertebral artery and left PICA. This may be related to the occlusion of atherosclerosis. Dissection not excluded. Ectatic right internal carotid artery. Small bulge proximal cavernous segment may be related to atherosclerosis rather than aneurysm. Stability can be confirmed on follow-up. Prominent ectasia right carotid terminus. No definitive aneurysm. Evaluation  limited by motion. Mild to moderate narrowing of the M1 segment of the right middle cerebral artery. Mild to moderate narrowing M1 segment left middle cerebral artery. Hypoplastic versus markedly diseased A1 segment of the left anterior cerebral artery.  Middle cerebral artery moderate branch vessel irregularity. Mild narrowing distal right vertebral artery. Mild to moderate irregularity with areas of mild to moderate narrowing basilar artery.  Nonvisualization right AICA. Fetal type origin of the right posterior cerebral artery. Moderate  to marked narrowing involving portions of the posterior cerebral artery bilaterally.  IMPRESSION: Left internal carotid artery is small in caliber. This may be related to proximal stenosis. Superimposed atherosclerotic type changes (dissection not excluded). Nonvisualization left vertebral artery and left PICA. This may be related to the occlusion of atherosclerosis. Dissection not excluded.  Fuller Canada,  M.D.   05/09/11 Carotid Doppler: Preliminary report: Right: 60-79% internal carotid artery stenosis. Left: The internal carotid appears to be fed by a retrograde branch of the external carotid. Bilateral: Vertebral artery flow is antegrade. The left vertebral waveform becomes spiked in the distal portion consistent with a more distal obstruction.  05/08/11 2 D ECHO: Left ventricle moderate LVH. Systolic function of 65% to 70%.  Mid-cavity gradient, peak 33 mmHg.grade 1diastolic dysfunction.  - Aortic valve: Trileaflet; severely calcified leaflets. Mild to moderate aortic stenosis. Mean gradient:41mm Hg(S). Peak gradient: 32mm Hg (S). Valve area:1.36cm^2(VTI). - Mitral valve: Mildly calcified annulus. - Left atrium: The atrium was mildly dilated. - Right ventricle: The cavity size was normal. Systolic function was normal. - Pulmonary arteries: No complete TR doppler jet so unable to estimate PA systolic pressure. - Inferior vena cava: The vessel was normal in size; the respirophasic diameter changes were in the normal range (=50%); findings are consistent with normal central venous pressure. Impressions: Normal LV size with moderate LV hypertrophy. Vigorous systolic function, EF 65-70% with mid cavity gradient to 33 mmHg. The aortic valve was severely calcified with mild to moderate stenosis (mild by mean gradient, moderate visually and by valve area). Normal RV size and systolic function.   Therapies: rec. inpt rehab.   Assessment: 61 y.o. 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 CTA head neck concerning for proximal right internal carotid artery with irregular plaque and ulceration, 76% diameter stenosis;  left internal carotid artery with proximal focal occlusion; left vertebral is occluded at C1; proximal right vertebral artery with moderate to marked narrowing.  2. Mild - mod AS on 2D Echo 3. necrotic appearing 1.5 x 1.3 x  2.2 cm node suspicious for malignancy posterior to the angle of the left mandible. Primary source is not identified.   4. Ground-glass opacity right upper lobe.  Follow-up chest CT in 3 months recommended.  5. Diabetes Mellitus- uncontrolled; A1C 10.2 6. HTN- controlled. 7. Hyperlipidemia; not at goal <80; LDL 135 8. Tobacco abuse 9. Depression  Stroke Risk Factors - diabetes mellitus, hyperlipidemia, hypertension and smoking  Plan: 1. Defer transfer to CIR due to need for further evaluation of carotid disease and AS. 2. Primary team to contact ENT re: mandibular necrotic mass. 3. Consider cardiology consult to evaluate AS. 4. Will require f/u CT chest due to opacity RUL on CTA. 5. IR cerebral angiogram Monday to evaluate abnormal carotid and vertebral disease noted on CTA in setting of recent CVA. Have discussed with Dr. Corliss Skains and talked with radiology to place this order. 6. Aggressive risk factor reduction, aggressive DM management. 7. Smoking cessation consult. 8.  LDL not at goal on home dose pravachol 40, change to lipitor 40. 9. Patient has been accepted to rehab but given findings on CTA this will be delayed until after her catheter angiogram. This was discussed with the patient and her family at length.  This case was discussed with Dr. Verta Ellen, Dr. Lendell Caprice, and Dr. Corliss Skains.   LOS: 2 days   Marya Fossa PA-C [facility] [phone] 05/10/2011  10:15 AM  I have seen and examined this patient and agree with the findings and plan listed above. Kipp Laurence, MD [facility] 05/10/2011 12:18 PM

## 2011-05-10 NOTE — Progress Notes (Signed)
Discussed diabetes education with RD.  Told RD paient might not be receptive to learning at this time due to recent discussion with neurologist about procedure scheduled for Monday.

## 2011-05-10 NOTE — Discharge Summary (Signed)
Patient ID: Chelsey Gallagher MRN: [id number] DOB/AGE: 1950/10/29 61 y.o.  Admit date: 05/08/2011 Discharge date: 05/10/2011  Primary Care Physician:  Betsey Holiday, MD, MD  Discharge Diagnoses:    Present on Admission:  .CVA (cerebral infarction) .Hypertension .Diabetes mellitus  Principal Problem:  *CVA (cerebral infarction) Active Problems:  Hypertension  Diabetes mellitus   Medication List  As of 05/10/2011 10:21 AM   STOP taking these medications         glyBURIDE micronized 3 MG tablet      lisinopril 20 MG tablet      pravastatin 40 MG tablet         TAKE these medications         citalopram 20 MG tablet   Commonly known as: CELEXA   Take 20 mg by mouth daily.      dicyclomine 10 MG capsule   Commonly known as: BENTYL   Take 10 mg by mouth 4 (four) times daily -  before meals and at bedtime.      gabapentin 600 MG tablet   Commonly known as: NEURONTIN   Take 300-600 mg by mouth 2 (two) times daily. 300mg  at supper and 600mg  at bedtime      HYDROcodone-acetaminophen 7.5-325 MG per tablet   Commonly known as: NORCO   Take 0.5-1 tablets by mouth every 4 (four) hours as needed. For pain      metFORMIN 500 MG tablet   Commonly known as: GLUCOPHAGE   Take 1,000-1,500 mg by mouth 2 (two) times daily with a meal. 1500 mg every morning and 1000 mg every evening      rOPINIRole 3 MG tablet   Commonly known as: REQUIP   Take 3 mg by mouth at bedtime.            Disposition and Follow-up: Pt will be discharged to inpatient rehabilitation unit at [facility] on [address] for further evaluation and care. Please note above that glyburide was stopped and insulin Lantus was started 15 units QHS, pt's HgA1C was > 10. She will continue to take Metformin. Please also note that dose of Lisinopril and Simvastatin was changed.   Consults:  Neurology - stroke evaluation  Significant Diagnostic Studies:  Dg Chest 2 View 05/08/2011  IMPRESSION: No evidence of  acute cardiopulmonary disease.    Ct Head Wo Contrast 05/08/2011  IMPRESSION: No acute intracranial abnormalities.  These results were called by telephone on 05/08/2011  at  0230 hrs to  Dr. Rubin Payor, who verbally acknowledged these results.    Mr Brain Wo Contrast 05/08/2011   IMPRESSION: Acute non hemorrhagic left paracentral medulla infarct.  Tiny acute non hemorrhagic posterior left cerebellar infarct.  Please see above.    MRA HEAD  IMPRESSION: Left internal carotid artery is small in caliber.  This may be related to proximal stenosis.  Superimposed atherosclerotic type changes (dissection not excluded).  Nonvisualization left vertebral artery and left PICA.  This may be related to the    Brief H and P: 61 yo F with h/o HTN, diabetes, current tobacco abuse, restless legs syndrome, presents with possible CVA. Pt presented with symptoms of dyspepsia and nausea, went to bed around 10pm and woke up in the night with some right arm and right leg numbness, that progressed to weakness of her right side. She called her daughter for help. There was associated slurred speech and possible facial droop, and they also note decreased hearing on the right side. EMS initiated code stroke.  Vitals in the ED with HTN up to 207/102. Labs with hypoNa 130, normal renal, LFT's, cardiac enzymes, CBC, INR, Trop POC. CT head with nothing acute, although there is calcification of ICA's at skull bases bilaterally. Neuro saw the patient and felt a posterior circulation ischemic event was likely, but outside window for tPA and have given their recs, and admit to medicine.   Physical Exam on Discharge:  Filed Vitals:   05/09/11 1400 05/09/11 1800 05/09/11 2213 05/10/11 0636  BP: 162/85 132/72 180/83 144/82  Pulse: 73 59 73 61  Temp: 97.7 F (36.5 C) 98.3 F (36.8 C) 98.4 F (36.9 C) 97.7 F (36.5 C)  TempSrc: Oral Oral Oral Oral  Resp: 18 18 20 20   Height:      Weight:      SpO2: 92% 97% 95% 94%      Intake/Output Summary (Last 24 hours) at 05/10/11 1021 Last data filed at 05/10/11 0842  Gross per 24 hour  Intake    240 ml  Output      0 ml  Net    240 ml    General: Alert, awake, oriented x3, in no acute distress. HEENT: No bruits, no goiter. Heart: Regular rate and rhythm, without murmurs, rubs, gallops. Lungs: Clear to auscultation bilaterally. Abdomen: Soft, nontender, nondistended, positive bowel sounds. Extremities: No clubbing cyanosis or edema with positive pedal pulses. Neuro: Pt moving right lower extremity against gravity, still unable to mover right upper extremity, speech is improved but still slurred  CBC:    Component Value Date/Time   WBC 7.2 05/10/2011 0600   HGB 16.4* 05/10/2011 0600   HCT 46.9* 05/10/2011 0600   PLT 144* 05/10/2011 0600   MCV 90.4 05/10/2011 0600   NEUTROABS 5.3 05/08/2011 0219   LYMPHSABS 1.2 05/08/2011 0219   MONOABS 0.4 05/08/2011 0219   EOSABS 0.0 05/08/2011 0219   BASOSABS 0.0 05/08/2011 0219    Basic Metabolic Panel:    Component Value Date/Time   NA 132* 05/10/2011 0600   K 3.6 05/10/2011 0600   CL 96 05/10/2011 0600   CO2 26 05/10/2011 0600   BUN 11 05/10/2011 0600   CREATININE 0.43* 05/10/2011 0600   GLUCOSE 184* 05/10/2011 0600   CALCIUM 9.4 05/10/2011 0600    Hospital Course:  Principal Problem:  *CVA (cerebral infarction) - please note the above MRI findings - pt was evaluated and managed by stroke service - pt was immediately started on BP medication, cholesterol and diabetic medications - education was provided on risk factor control and PT will continue in an inpatient setting - pt will need to follow up in an outpatient setting with neurology upon discharge from inpatient rehabilitation  Active Problems:  Hypertension - pleas note that the dose of Lisinopril was increased to 40 mg PO QD and pt has responded well - BP remained stable during the hospitalization   Diabetes mellitus - uncontrolled with A1C > 10 - lantus was started,  15 units Q HS along with continuation of home medication Metformin - Glyburide was held and eventually discontinued upon transfer to IR unit 4000  Time spent on Discharge: Over 30 minutes  Signed: Debbora Presto 05/10/2011, 10:21 AM  TRH [phone]

## 2011-05-11 ENCOUNTER — Encounter (HOSPITAL_COMMUNITY): Payer: Self-pay | Admitting: Radiology

## 2011-05-11 DIAGNOSIS — J984 Other disorders of lung: Secondary | ICD-10-CM

## 2011-05-11 DIAGNOSIS — R220 Localized swelling, mass and lump, head: Secondary | ICD-10-CM | POA: Diagnosis present

## 2011-05-11 HISTORY — DX: Other disorders of lung: J98.4

## 2011-05-11 LAB — GLUCOSE, CAPILLARY
Glucose-Capillary: 186 mg/dL — ABNORMAL HIGH (ref 70–99)
Glucose-Capillary: 171 mg/dL — ABNORMAL HIGH (ref 70–99)
Glucose-Capillary: 146 mg/dL — ABNORMAL HIGH (ref 70–99)

## 2011-05-11 MED ORDER — NICOTINE 21 MG/24HR TD PT24
21.0000 mg | MEDICATED_PATCH | Freq: Every day | TRANSDERMAL | Status: DC
  Administered 2011-05-11 – 2011-05-14 (×4): 21 mg via TRANSDERMAL
  Filled 2011-05-11 (×4): qty 1

## 2011-05-11 MED ORDER — ZOLPIDEM TARTRATE 5 MG PO TABS
5.0000 mg | ORAL_TABLET | Freq: Every day | ORAL | Status: DC
  Administered 2011-05-11 – 2011-05-13 (×3): 5 mg via ORAL
  Filled 2011-05-11 (×3): qty 1

## 2011-05-11 NOTE — H&P (Signed)
Chelsey Gallagher is an 61 y.o. female.   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 Diabetes mellitus   . Hypertension   . Depression   . PAD (peripheral artery disease)     S/p bypass grafting, unclear exactly where  . Nephrolithiasis     Past Surgical History  Procedure Date  . Total hip arthroplasty     Right  . Abdominal hysterectomy   . Cholecystectomy 1984  . Appendectomy     History reviewed. No pertinent family history. Social History:  reports that she has been smoking Cigarettes.  She has a 45 pack-year smoking history. She has never used smokeless tobacco. She reports that she does not drink alcohol or use illicit drugs.  Allergies:  Allergies  Allergen Reactions  . Codeine   . Flexeril (Cyclobenzaprine Hcl)     Medications Prior to Admission  Medication Dose Route Frequency Provider Last Rate Last Dose  . acetaminophen (TYLENOL) tablet 650 mg  650 mg Oral Q4H PRN Devonne Doughty, MD       Or  . acetaminophen (TYLENOL) suppository 650 mg  650 mg Rectal Q4H PRN Devonne Doughty, MD      . aspirin suppository 300 mg  300 mg Rectal Daily Devonne Doughty, MD       Or  . aspirin tablet 325 mg  325 mg Oral Daily Devonne Doughty, MD   325 mg at 05/10/11 1121  . atorvastatin (LIPITOR) tablet 40 mg  40 mg Oral q1800 Tara C Jernejcic, PA   40 mg at 05/10/11 2049  . citalopram (CELEXA) tablet 20 mg  20 mg Oral Daily Devonne Doughty, MD   20 mg at 05/10/11 1121  . enoxaparin (LOVENOX) injection 40 mg  40 mg Subcutaneous Q24H Devonne Doughty, MD   40 mg at 05/10/11 1124  . feeding supplement (GLUCERNA SHAKE) liquid 237 mL  237 mL Oral TID WC Ailene Ards, RD   237 mL at 05/10/11 1832  . gabapentin (NEURONTIN) tablet 600 mg  600 mg Oral To Major Juliet Rude. Pickering, MD   600 mg at 05/08/11 0413  . gabapentin (NEURONTIN) tablet 600 mg  600 mg Oral QHS  Devonne Doughty, MD   600 mg at 05/10/11 2304  . insulin aspart (novoLOG) injection 0-15 Units  0-15 Units Subcutaneous TID WC Devonne Doughty, MD   3 Units at 05/11/11 [phone]  . insulin glargine (LANTUS) injection 15 Units  15 Units Subcutaneous QHS Dorothea Ogle, MD   15 Units at 05/10/11 2305  . iohexol (OMNIPAQUE) 350 MG/ML injection 50 mL  50 mL Intravenous Once PRN Medication Radiologist, MD   50 mL at 05/09/11 1318  . labetalol (NORMODYNE,TRANDATE) 5 MG/ML injection        10 mg at 05/08/11 0537  . labetalol (NORMODYNE,TRANDATE) injection 10 mg  10 mg Intravenous Q2H PRN Devonne Doughty, MD   10 mg at 05/10/11 1438  . lisinopril (PRINIVIL,ZESTRIL) tablet 40 mg  40 mg Oral Daily Dorothea Ogle, MD      . lisinopril (PRINIVIL,ZESTRIL) tablet 40 mg  40 mg Oral STAT Dorothea Ogle, MD   40 mg at 05/10/11 1122  . methocarbamol (ROBAXIN) tablet 500 mg  500 mg Oral Q8H PRN Dorothea Ogle, MD   500 mg at 05/10/11 1639  .  nicotine (NICODERM CQ - dosed in mg/24 hours) patch 21 mg  21 mg Transdermal Daily Dorothea Ogle, MD      . ondansetron [facility]) injection 4 mg  4 mg Intravenous Q6H PRN Devonne Doughty, MD   4 mg at 05/10/11 1611  . rOPINIRole (REQUIP) tablet 3 mg  3 mg Oral To Major Nathan R. Pickering, MD   3 mg at 05/08/11 0413  . rOPINIRole (REQUIP) tablet 3 mg  3 mg Oral QHS Devonne Doughty, MD   3 mg at 05/10/11 2303  . senna-docusate (Senokot-S) tablet 1 tablet  1 tablet Oral QHS PRN Devonne Doughty, MD      . zolpidem (AMBIEN) tablet 5 mg  5 mg Oral QHS Dorothea Ogle, MD      . DISCONTD: 0.9 %  sodium chloride infusion   Intravenous Continuous Juliet Rude. Pickering, MD 100 mL/hr at 05/08/11 0321    . DISCONTD: 0.9 %  sodium chloride infusion   Intravenous Continuous Stephani Police, PA 50 mL/hr at 05/09/11 0612 50 mL/hr at 05/09/11 0612  . DISCONTD: gabapentin (NEURONTIN) capsule 300 mg  300 mg Oral Q supper Devonne Doughty, MD   300 mg at 05/09/11 1721  . DISCONTD: gabapentin (NEURONTIN) tablet 300-600 mg  300-600 mg  Oral BID Devonne Doughty, MD      . DISCONTD: lisinopril (PRINIVIL,ZESTRIL) tablet 20 mg  20 mg Oral Daily Devonne Doughty, MD   20 mg at 05/09/11 1032  . DISCONTD: simvastatin (ZOCOR) tablet 20 mg  20 mg Oral q1800 Devonne Doughty, MD   20 mg at 05/09/11 1722   No current outpatient prescriptions on file as of 05/11/2011.    Results for orders placed during the hospital encounter of 05/08/11 (from the past 48 hour(s))  GLUCOSE, CAPILLARY     Status: Abnormal   Collection Time   05/09/11 11:53 AM      Component Value Range Comment   Glucose-Capillary 239 (*) 70 - 99 (mg/dL)    Comment 1 Notify RN     GLUCOSE, CAPILLARY     Status: Abnormal   Collection Time   05/09/11  5:06 PM      Component Value Range Comment   Glucose-Capillary 155 (*) 70 - 99 (mg/dL)    Comment 1 Notify RN     GLUCOSE, CAPILLARY     Status: Abnormal   Collection Time   05/09/11 10:49 PM      Component Value Range Comment   Glucose-Capillary 148 (*) 70 - 99 (mg/dL)    Comment 1 Documented in Chart      Comment 2 Notify RN     CBC     Status: Abnormal   Collection Time   05/10/11  6:00 AM      Component Value Range Comment   WBC 7.2  4.0 - 10.5 (K/uL)    RBC 5.19 (*) 3.87 - 5.11 (MIL/uL)    Hemoglobin 16.4 (*) 12.0 - 15.0 (g/dL)    HCT 11.9 (*) 14.7 - 46.0 (%)    MCV 90.4  78.0 - 100.0 (fL)    MCH 31.6  26.0 - 34.0 (pg)    MCHC 35.0  30.0 - 36.0 (g/dL)    RDW 82.9  56.2 - 13.0 (%)    Platelets 144 (*) 150 - 400 (K/uL)   BASIC METABOLIC PANEL     Status: Abnormal   Collection Time   05/10/11  6:00 AM  Component Value Range Comment   Sodium 132 (*) 135 - 145 (mEq/L)    Potassium 3.6  3.5 - 5.1 (mEq/L)    Chloride 96  96 - 112 (mEq/L)    CO2 26  19 - 32 (mEq/L)    Glucose, Bld 184 (*) 70 - 99 (mg/dL)    BUN 11  6 - 23 (mg/dL)    Creatinine, Ser 7.82 (*) 0.50 - 1.10 (mg/dL)    Calcium 9.4  8.4 - 10.5 (mg/dL)    GFR calc non Af Amer >90  >90 (mL/min)    GFR calc Af Amer >90  >90 (mL/min)   GLUCOSE, CAPILLARY      Status: Abnormal   Collection Time   05/10/11  6:34 AM      Component Value Range Comment   Glucose-Capillary 183 (*) 70 - 99 (mg/dL)    Comment 1 Notify RN      Comment 2 Documented in Chart     GLUCOSE, CAPILLARY     Status: Abnormal   Collection Time   05/10/11  8:10 AM      Component Value Range Comment   Glucose-Capillary 210 (*) 70 - 99 (mg/dL)    Comment 1 Notify RN      Comment 2 Documented in Chart     GLUCOSE, CAPILLARY     Status: Abnormal   Collection Time   05/10/11 11:47 AM      Component Value Range Comment   Glucose-Capillary 213 (*) 70 - 99 (mg/dL)    Comment 1 Notify RN      Comment 2 Documented in Chart     GLUCOSE, CAPILLARY     Status: Abnormal   Collection Time   05/10/11  4:12 PM      Component Value Range Comment   Glucose-Capillary 123 (*) 70 - 99 (mg/dL)    Comment 1 Notify RN      Comment 2 Documented in Chart     GLUCOSE, CAPILLARY     Status: Abnormal   Collection Time   05/10/11  9:59 PM      Component Value Range Comment   Glucose-Capillary 155 (*) 70 - 99 (mg/dL)    Comment 1 Documented in Chart      Comment 2 Notify RN     GLUCOSE, CAPILLARY     Status: Abnormal   Collection Time   05/11/11  6:50 AM      Component Value Range Comment   Glucose-Capillary 186 (*) 70 - 99 (mg/dL)    Comment 1 Documented in Chart      Comment 2 Notify RN      Ct Angio Head W/cm &/or Wo Cm  05/09/2011  *RADIOLOGY REPORT*  Clinical Data:  Right-sided weakness with slurred speech and facial droop.  High blood pressure.  Diabetes.  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  Question dissection?  CT ANGIOGRAPHY HEAD AND NECK  Technique:  Multidetector CT imaging of the head and neck was performed using the standard protocol during bolus administration of intravenous contrast.  Multiplanar CT image reconstructions including MIPs were obtained to evaluate the vascular anatomy. Carotid stenosis measurements (when applicable) are obtained utilizing NASCET  criteria, using the distal internal carotid diameter as the denominator.  Contrast: 50mL OMNIPAQUE IOHEXOL 350 MG/ML IV SOLN  Comparison:   None.  CTA NECK  Findings:  Normal origin of great vessels from the aortic arch. Prominent shaggy plaque of the undersurface of the aortic arch.  Plaque  with mild narrowing right common carotid artery.  Proximal right internal carotid artery with irregular plaque and ulceration.  76% diameter stenosis.  Distal right internal carotid artery calcified plaque with mild narrowing.  Plaque left common carotid artery with mild narrowing.  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 It is possible there is a tiny string sign at this level although not appreciated on axial thin-section image 139.  Beyond this region, the left internal carotid artery is of marked decreased caliber and significantly irregular.  As this arises from the prominent complex plaque, this is probably related to atherosclerosis rather than dissection.  Moderate irregular plaque with mild to moderate narrowing left subclavian artery.  Mild to moderate narrowing proximal left vertebral artery.  Mild to moderate narrowing of the portions of the cervical segment of the left vertebral artery.  At the C1 level, the left vertebral is occluded.  This may be related to a dissection and / or atherosclerotic type changes.  Proximal right subclavian artery shaggy plaque with mild to moderate narrowing.  Prominent plaque proximal right vertebral artery with moderate to marked narrowing.  Plaque along the cervical segment of the right vertebral artery without high-grade stenosis.  Thyroid goiter wraps around posteriorly.  No focal dominant mass identified.  Ground-glass opacity right upper lobe.  Follow-up chest CT in 3 months recommended.  Posterior to the angle of the left mandible there is a necrotic appearing 1.5 x 1.3 x 2.2 cm node suspicious for malignancy. Primary source is not identified.  ENT  consultation with direct visualization to exclude mucosal abnormality may be considered.  Polypoid opacification inferior aspect right maxillary sinus. Dental caries / peri apical lucency more notable upper right teeth.   Review of the MIP images confirms the above findings.  IMPRESSION: Posterior to the angle of the left mandible there is a necrotic appearing 1.5 x 1.3 x 2.2 cm node suspicious for malignancy. Primary source is not identified.  ENT consultation with direct visualization to exclude mucosal abnormality may be considered.  Ground-glass opacity right upper lobe.  Follow-up chest CT in 3 months recommended.  Proximal right internal carotid artery with irregular plaque and ulceration.  76% diameter stenosis.  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 It is possible there is a tiny string sign at this level although not appreciated on axial thin-section image 139.  Beyond this region, the left internal carotid artery is of marked decreased caliber and significantly irregular.  As this arises from the prominent complex plaque, this is probably related to atherosclerosis rather than dissection.  At the C1 level, the left vertebral is occluded.  This may be related to a dissection and / or atherosclerotic type changes.  Prominent plaque proximal right vertebral artery with moderate to marked narrowing.  CTA HEAD  Findings:  MR detected left paracentral medulla infarct and tiny left cerebellar infarct not well delineated on the current CT. Small vessel disease type changes.  No hydrocephalus or intracranial enhancing lesion.  No intracranial hemorrhage.  Left internal carotid artery pre cavernous and cavernous segment are significantly narrowed.  This is felt to represent combination of atherosclerotic type changes and proximal stenosis.  Right internal carotid artery cavernous segment plaque with mild to slightly moderate irregularity.   Mild ectasia of the right carotid terminus. No  discrete aneurysm.  Left vertebral artery is occluded.  Mild narrowing and irregularity of the basilar artery most notable mid aspect.   Review of the MIP images confirms  the above findings.  IMPRESSION: Left vertebral artery is occluded.  Mild narrowing and irregularity of the basilar artery most notable mid aspect.  Left internal carotid artery pre cavernous and cavernous segment are significantly narrowed.  This is felt to represent combination of atherosclerotic type changes and proximal stenosis.  Please see above.  Original Report Authenticated By: Fuller Canada, M.D.   Ct Angio Neck W/cm &/or Wo/cm  05/09/2011  *RADIOLOGY REPORT*  Clinical Data:  Right-sided weakness with slurred speech and facial droop.  High blood pressure.  Diabetes.  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  Question dissection?  CT ANGIOGRAPHY HEAD AND NECK  Technique:  Multidetector CT imaging of the head and neck was performed using the standard protocol during bolus administration of intravenous contrast.  Multiplanar CT image reconstructions including MIPs were obtained to evaluate the vascular anatomy. Carotid stenosis measurements (when applicable) are obtained utilizing NASCET criteria, using the distal internal carotid diameter as the denominator.  Contrast: 50mL OMNIPAQUE IOHEXOL 350 MG/ML IV SOLN  Comparison:   None.  CTA NECK  Findings:  Normal origin of great vessels from the aortic arch. Prominent shaggy plaque of the undersurface of the aortic arch.  Plaque with mild narrowing right common carotid artery.  Proximal right internal carotid artery with irregular plaque and ulceration.  76% diameter stenosis.  Distal right internal carotid artery calcified plaque with mild narrowing.  Plaque left common carotid artery with mild narrowing.  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 It is possible there is a tiny string sign at this level although not appreciated on axial  thin-section image 139.  Beyond this region, the left internal carotid artery is of marked decreased caliber and significantly irregular.  As this arises from the prominent complex plaque, this is probably related to atherosclerosis rather than dissection.  Moderate irregular plaque with mild to moderate narrowing left subclavian artery.  Mild to moderate narrowing proximal left vertebral artery.  Mild to moderate narrowing of the portions of the cervical segment of the left vertebral artery.  At the C1 level, the left vertebral is occluded.  This may be related to a dissection and / or atherosclerotic type changes.  Proximal right subclavian artery shaggy plaque with mild to moderate narrowing.  Prominent plaque proximal right vertebral artery with moderate to marked narrowing.  Plaque along the cervical segment of the right vertebral artery without high-grade stenosis.  Thyroid goiter wraps around posteriorly.  No focal dominant mass identified.  Ground-glass opacity right upper lobe.  Follow-up chest CT in 3 months recommended.  Posterior to the angle of the left mandible there is a necrotic appearing 1.5 x 1.3 x 2.2 cm node suspicious for malignancy. Primary source is not identified.  ENT consultation with direct visualization to exclude mucosal abnormality may be considered.  Polypoid opacification inferior aspect right maxillary sinus. Dental caries / peri apical lucency more notable upper right teeth.   Review of the MIP images confirms the above findings.  IMPRESSION: Posterior to the angle of the left mandible there is a necrotic appearing 1.5 x 1.3 x 2.2 cm node suspicious for malignancy. Primary source is not identified.  ENT consultation with direct visualization to exclude mucosal abnormality may be considered.  Ground-glass opacity right upper lobe.  Follow-up chest CT in 3 months recommended.  Proximal right internal carotid artery with irregular plaque and ulceration.  76% diameter stenosis.  Plaque  left internal carotid artery with proximal focal occlusion. It  is possible there is a tiny string sign at this level although not appreciated on axial thin-section image 139.  Beyond this region, the left internal carotid artery is of marked decreased caliber and significantly irregular.  As this arises from the prominent complex plaque, this is probably related to atherosclerosis rather than dissection.  At the C1 level, the left vertebral is occluded.  This may be related to a dissection and / or atherosclerotic type changes.  Prominent plaque proximal right vertebral artery with moderate to marked narrowing.  CTA HEAD  Findings:  MR detected left paracentral medulla infarct and tiny left cerebellar infarct not well delineated on the current CT. Small vessel disease type changes.  No hydrocephalus or intracranial enhancing lesion.  No intracranial hemorrhage.  Left internal carotid artery pre cavernous and cavernous segment are significantly narrowed.  This is felt to represent combination of atherosclerotic type changes and proximal stenosis.  Right internal carotid artery cavernous segment plaque with mild to slightly moderate irregularity.   Mild ectasia of the right carotid terminus. No discrete aneurysm.  Left vertebral artery is occluded.  Mild narrowing and irregularity of the basilar artery most notable mid aspect.   Review of the MIP images confirms the above findings.  IMPRESSION: Left vertebral artery is occluded.  Mild narrowing and irregularity of the basilar artery most notable mid aspect.  Left internal carotid artery pre cavernous and cavernous segment are significantly narrowed.  This is felt to represent combination of atherosclerotic type changes and proximal stenosis.  Please see above.  Original Report Authenticated By: Fuller Canada, M.D.    Review of Systems  Constitutional: Negative for fever.  Cardiovascular: Negative for chest pain.  Neurological: Negative for headaches.       Rt  side weakness    Blood pressure 105/67, pulse 77, temperature 97.7 F (36.5 C), temperature source Oral, resp. rate 19, height 5\' 3"  (1.6 m), weight 147 lb 14.4 oz (67.087 kg), SpO2 96.00%. Physical Exam  Constitutional: She is oriented to person, place, and time.  Cardiovascular: Normal rate, regular rhythm and normal heart sounds.   No murmur heard. Respiratory: Effort normal and breath sounds normal. She has no wheezes.  GI: Soft. Bowel sounds are normal. There is no tenderness.  Neurological: She is alert and oriented to person, place, and time.     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 Consent signed and in chart  Ha Placeres A 05/11/2011, 9:08 AM

## 2011-05-11 NOTE — Progress Notes (Signed)
History: Chelsey Gallagher is an 61 y.o. female who reports that she laid down about 10pm and felt fine at that time. Got up about 1015 and felt nauseous and dizzy. Had some vomiting. Looked in the mirror and noted that she had a right facial droop. Then noted numbness in her right upper extremity. It went to her right lower extremity and then to the right ear. She attempted to call her daughter but had difficulty managing to et to the phone and then dial. When speaking noticed that her speech was slurred. Her daughter called EMS. EMS initiated the code stroke. She presented beyond IV TPA window.   LSN: [id number]  tPA Given: No: Outside time window  mRankin: 0 Level of Consciousness (1a. ): Alert, keenly responsive (03/09 2050) LOC Questions (1b. ): Answers both questions correctly (03/09 2050) LOC Commands (1c. ): Performs one task correctly (03/09 2050) Best Gaze (2. ): Normal (03/09 2050) Visual (3. ): No visual loss (03/09 2050) Facial Palsy (4. ): Minor paralysis (03/09 2050) Motor Arm, Left (5a. ): No drift (03/09 2050) Motor Arm, Right (5b. ): No movement (03/09 2050) Motor Leg, Left (6a. ): No drift (03/09 2050) Motor Leg, Right (6b. ): Drift (03/09 2050) Limb Ataxia (7. ): Absent (03/09 2050) Sensory (8. ): Mild-to-moderate sensory loss, patient feels pinprick is less sharp or is dull on the affected side, or there is a loss of superficial pain with pinprick, but patient is aware of being touched (03/09 2050) Best Language (9. ): No aphasia (03/09 2050) Dysarthria (10. ): Mild-to-moderate dysarthria, patient slurs at least some words and, at worst, can be understood with some difficulty (03/09 2050) Inattention/Extinction: No Abnormality (03/09 2050) Total: 9  (03/09 2050)  Subjective: With patch off OS, slightly improved diplopla.  Does get worse sitting up.  Speech improving, unable to use R UE.  Can move RLE a little.  Husband at bedside.  Nursing said patient is a difficult transfer-  required 3 person assist to transfer on Friday.  Didn't try yesterday.    Objective: BP 105/67  Pulse 77  Temp(Src) 97.7 F (36.5 C) (Oral)  Resp 19  Ht 5\' 3"  (1.6 m)  Wt 67.087 kg (147 lb 14.4 oz)  BMI 26.20 kg/m2  SpO2 96%   CBGs  Basename 05/11/11 0650 05/10/11 2159 05/10/11 1612 05/10/11 1147 05/10/11 0810 05/10/11 0634 05/09/11 2249 05/09/11 1706 05/09/11 1153 05/09/11 0708  GLUCAP 186* 155* 123* 213* 210* 183* 148* 155* 239* 231*   Diet: D3, thin  Activity: up with assistance  DVT Prophylaxis: lovenox  Medications: Scheduled:    . aspirin  300 mg Rectal Daily   Or  . aspirin  325 mg Oral Daily  . atorvastatin  40 mg Oral q1800  . citalopram  20 mg Oral Daily  . enoxaparin  40 mg Subcutaneous Q24H  . feeding supplement  237 mL Oral TID WC  . gabapentin  600 mg Oral QHS  . insulin aspart  0-15 Units Subcutaneous TID WC  . insulin glargine  15 Units Subcutaneous QHS  . lisinopril  40 mg Oral Daily  . lisinopril  40 mg Oral STAT  . nicotine  21 mg Transdermal Daily  . rOPINIRole  3 mg Oral QHS  . zolpidem  5 mg Oral QHS  . DISCONTD: simvastatin  20 mg Oral q1800   Neurologic Exam: Mental Status: Alert, oriented, thought content appropriate.  Speech fluent without evidence of aphasia. Mild dysarthria.   Able to  follow 3 step commands without difficulty. Cranial Nerves: II- Visual fields grossly intact, with both eyes open, has vertical diploplia.  III/IV/VI-Extraocular movements intact.  Nystagmus prominent with left lateral gaze.  Pupils reactive bilaterally. V/VII-Smile symmetric VIII-hearing grossly intact IX/X-normal gag XI-bilateral shoulder shrug XII-midline tongue extension Motor: 1/5 RUE, increased tone, 5/5 LUE normal tone, 3-4/5 RLE with increased tone, 5/5 LLE. Sensory: diminished sensation on the right. Deep Tendon Reflexes: 2+ and symmetric throughout Plantars: upgoing on the right Cerebellar: Normal finger-to-nose L, normal rapid alternating  movements L.  Unable to perform on the right.  Lab Results: Basic Metabolic Panel:  Lab 05/10/11 1610 05/09/11 0632  NA 132* 131*  K 3.6 3.8  CL 96 97  CO2 26 26  GLUCOSE 184* 226*  BUN 11 12  CREATININE 0.43* 0.48*  CALCIUM 9.4 9.2  MG -- --  PHOS -- --   Liver Function Tests:  Lab 05/08/11 0219  AST 19  ALT 21  ALKPHOS 104  BILITOT 0.5  PROT 7.6  ALBUMIN 3.7   CBC:  Lab 05/10/11 0600 05/09/11 0632 05/08/11 0219  WBC 7.2 8.6 --  NEUTROABS -- -- 5.3  HGB 16.4* 16.2* --  HCT 46.9* 47.8* --  MCV 90.4 91.6 --  PLT 144* 155 --   Cardiac Enzymes:  Lab 05/08/11 0219  CKTOTAL 50  CKMB 2.2  CKMBINDEX --  TROPONINI <0.30   CBG:  Lab 05/11/11 0650 05/10/11 2159 05/10/11 1612 05/10/11 1147 05/10/11 0810 05/10/11 0634  GLUCAP 186* 155* 123* 213* 210* 183*   Hemoglobin A1C:  Lab 05/08/11 1227  HGBA1C 10.2*   Fasting Lipid Panel:  Lab 05/08/11 1230  CHOL 216*  HDL 25*  LDLCALC 135*  TRIG 279*  CHOLHDL 8.6  LDLDIRECT --   Coagulation:  Lab 05/08/11 0219  LABPROT 12.5  INR 0.91   Anemia Panel:  Lab 05/08/11 1227  VITAMINB12 --  FOLATE --  FERRITIN 115  TIBC Not calculated due to Iron <10.  IRON <10*  RETICCTPCT --   Study Results: 05/09/2011  CT ANGIOGRAPHY  NECK  Findings:  Normal origin of great vessels from the aortic arch. Prominent shaggy plaque of the undersurface of the aortic arch.  Plaque with mild narrowing right common carotid artery.  Proximal right internal carotid artery with irregular plaque and ulceration.  76% diameter stenosis.  Distal right internal carotid artery calcified plaque with mild narrowing.  Plaque left common carotid artery with mild narrowing.  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 It is possible there is a tiny string sign at this level although not appreciated on axial thin-section image 139.  Beyond this region, the left internal carotid artery is of marked decreased caliber and  significantly irregular.  As this arises from the prominent complex plaque, this is probably related to atherosclerosis rather than dissection.  Moderate irregular plaque with mild to moderate narrowing left subclavian artery.  Mild to moderate narrowing proximal left vertebral artery.  Mild to moderate narrowing of the portions of the cervical segment of the left vertebral artery.  At the C1 level, the left vertebral is occluded.  This may be related to a dissection and / or atherosclerotic type changes.  Proximal right subclavian artery shaggy plaque with mild to moderate narrowing.  Prominent plaque proximal right vertebral artery with moderate to marked narrowing.  Plaque along the cervical segment of the right vertebral artery without high-grade stenosis.  Thyroid goiter wraps around posteriorly.  No focal dominant mass identified.  Ground-glass opacity right upper lobe.  Follow-up chest CT in 3 months recommended.  Posterior to the angle of the left mandible there is a necrotic appearing 1.5 x 1.3 x 2.2 cm node suspicious for malignancy. Primary source is not identified.  ENT consultation with direct visualization to exclude mucosal abnormality may be considered.  Polypoid opacification inferior aspect right maxillary sinus. Dental caries / peri apical lucency more notable upper right teeth.   Review of the MIP images confirms the above findings.  IMPRESSION:  1. Posterior to the angle of the left mandible there is a necrotic appearing 1.5 x 1.3 x 2.2 cm node suspicious for malignancy. Primary source is not identified.  ENT consultation with direct visualization to exclude mucosal abnormality may be considered.  2. Ground-glass opacity right upper lobe.  Follow-up chest CT in 3 months recommended.  3. Proximal right internal carotid artery with irregular plaque and ulceration.  76% diameter stenosis.   4. 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 It is possible there is a tiny  string sign at this level although not appreciated on axial thin-section image 139. 5.  Beyond this region, the left internal carotid artery is of marked decreased caliber and significantly irregular.  As this arises from the prominent complex plaque, this is probably related to atherosclerosis rather than dissection.   6. At the C1 level, the left vertebral is occluded.  This may be related to a dissection and / or atherosclerotic type changes.   7.Prominent plaque proximal right vertebral artery with moderate to marked narrowing.  Fuller Canada, M.D.  05/09/11 CTA HEAD  Findings:  MR detected left paracentral medulla infarct and tiny left cerebellar infarct not well delineated on the current CT. Small vessel disease type changes.  No hydrocephalus or intracranial enhancing lesion.  No intracranial hemorrhage.  Left internal carotid artery pre cavernous and cavernous segment are significantly narrowed.  This is felt to represent combination of atherosclerotic type changes and proximal stenosis.  Right internal carotid artery cavernous segment plaque with mild to slightly moderate irregularity.   Mild ectasia of the right carotid terminus. No discrete aneurysm.  Left vertebral artery is occluded.  Mild narrowing and irregularity of the basilar artery most notable mid aspect.   Review of the MIP images confirms the above findings.  IMPRESSION: Left vertebral artery is occluded.  Mild narrowing and irregularity of the basilar artery most notable mid aspect.  Left internal carotid artery pre cavernous and cavernous segment are significantly narrowed.  This is felt to represent combination of atherosclerotic type changes and proximal stenosis.  Fuller Canada, M.D.  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 Tiny acute non hemorrhagic posterior left cerebellar infarct. Mild to moderate nonspecific white matter type changes most consistent with result of small vessel  disease in this hypertensive diabetic patient. This involves the subcortical periventricular region as well as a central pons. Cava septum pellucidum et vergae incidentally noted. No hydrocephalus. Abnormal appearance of the left vertebral artery and left carotid artery. Please see below. Polypoid opacification inferior aspect right maxillary sinus. Minimal paranasal sinus mucosal thickening. No intracranial mass lesion detected on this unenhanced motion degraded exam.  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 Tiny acute non hemorrhagic posterior left cerebellar infarct.  Fuller Canada, M.D.  05/09/11 MRA HEAD Findings: Exam limited by motion. Left internal carotid artery is small in caliber. This may be related to proximal stenosis. Superimposed atherosclerotic type changes (  dissection not excluded).  Nonvisualization left vertebral artery and left PICA. This may be related to the occlusion of atherosclerosis. Dissection not excluded. Ectatic right internal carotid artery. Small bulge proximal cavernous segment may be related to atherosclerosis rather than aneurysm. Stability can be confirmed on follow-up. Prominent ectasia right carotid terminus. No definitive aneurysm. Evaluation limited by motion. Mild to moderate narrowing of the M1 segment of the right middle cerebral artery. Mild to moderate narrowing M1 segment left middle cerebral artery. Hypoplastic versus markedly diseased A1 segment of the left anterior cerebral artery.  Middle cerebral artery moderate branch vessel irregularity. Mild narrowing distal right vertebral artery. Mild to moderate irregularity with areas of mild to moderate narrowing basilar artery.  Nonvisualization right AICA. Fetal type origin of the right posterior cerebral artery. Moderate  to marked narrowing involving portions of the posterior cerebral artery bilaterally.  IMPRESSION: Left internal carotid artery is small in caliber. This may be related  to proximal stenosis. Superimposed atherosclerotic type changes (dissection not excluded). Nonvisualization left vertebral artery and left PICA. This may be related to the occlusion of atherosclerosis. Dissection not excluded.  Fuller Canada, M.D.   05/09/11 Carotid Doppler: Preliminary report: Right: 60-79% internal carotid artery stenosis. Left: The internal carotid appears to be fed by a retrograde branch of the external carotid. Bilateral: Vertebral artery flow is antegrade. The left vertebral waveform becomes spiked in the distal portion consistent with a more distal obstruction.  05/08/11 2 D ECHO: Left ventricle moderate LVH. Systolic function of 65% to 70%.  Mid-cavity gradient, peak 33 mmHg.grade 1diastolic dysfunction.  - Aortic valve: Trileaflet; severely calcified leaflets. Mild to moderate aortic stenosis. Mean gradient:71mm Hg(S). Peak gradient: 32mm Hg (S). Valve area:1.36cm^2(VTI). - Mitral valve: Mildly calcified annulus. - Left atrium: The atrium was mildly dilated. - Right ventricle: The cavity size was normal. Systolic function was normal. - Pulmonary arteries: No complete TR doppler jet so unable to estimate PA systolic pressure. - Inferior vena cava: The vessel was normal in size; the respirophasic diameter changes were in the normal range (=50%); findings are consistent with normal central venous pressure. Impressions: Normal LV size with moderate LV hypertrophy. Vigorous systolic function, EF 65-70% with mid cavity gradient to 33 mmHg. The aortic valve was severely calcified with mild to moderate stenosis (mild by mean gradient, moderate visually and by valve area). Normal RV size and systolic function.   Therapies: rec. inpt rehab.   Assessment: 61 y.o. 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 CTA head neck concerning for proximal right internal carotid artery with irregular plaque and ulceration, 76%  diameter stenosis;  left internal carotid artery with proximal focal occlusion; left vertebral is occluded at C1; proximal right vertebral artery with moderate to marked narrowing.  2. Mild - mod AS on 2D Echo 3. necrotic appearing 1.5 x 1.3 x 2.2 cm node suspicious for malignancy posterior to the angle of the left mandible. Primary source is not identified.   4. Ground-glass opacity right upper lobe.  Follow-up chest CT in 3 months recommended.  5. Diabetes Mellitus- uncontrolled; A1C 10.2 6. HTN- controlled. 7. Hyperlipidemia; not at goal <80; LDL 135 8. Tobacco abuse 9. Depression  Stroke Risk Factors - diabetes mellitus, hyperlipidemia, hypertension and smoking  Plan: 1. Defer transfer to CIR due to need for further evaluation of carotid disease and AS. 2. Primary team to contact ENT re: mandibular necrotic mass. 3. Consider cardiology consult to evaluate AS. 4. Will  require f/u CT chest due to opacity RUL on CTA. 5. IR cerebral angiogram Monday to evaluate abnormal carotid and vertebral disease noted on CTA in setting of recent CVA.  6. Aggressive risk factor reduction, aggressive DM management. 7. Smoking cessation consult. 8. LDL not at goal on home dose pravachol 40, change to lipitor 40. 9. Patient has been accepted to rehab but given findings on CTA this will be delayed until after her catheter angiogram. This was discussed with the patient and her family at length.  This case was discussed with Dr. Verta Ellen, Dr. Lendell Caprice, and Dr. Corliss Skains.   LOS: 3 days   Marya Fossa PA-C [facility] [phone] 05/11/2011  10:29 AM  I have seen and examined this patient and agree with the findings and plan listed above. Kipp Laurence, MD [facility] 05/11/2011 12:07 PM

## 2011-05-11 NOTE — Progress Notes (Signed)
Patient ID: Chelsey Gallagher, female   DOB: 1951-01-02, 61 y.o.   MRN: [id number]  Subjective: No events overnight. Patient denies chest pain, shortness of breath, abdominal pain.   Objective:  Vital signs in last 24 hours:  Filed Vitals:   05/10/11 1725 05/10/11 2139 05/11/11 0612 05/11/11 1036  BP: 159/67 148/70 105/67 160/75  Pulse: 76 76 77 70  Temp: 97.7 F (36.5 C) 98.7 F (37.1 C) 97.7 F (36.5 C) 97.7 F (36.5 C)  TempSrc:  Oral Oral   Resp: 20 21 19 20   Height:      Weight:      SpO2: 93% 93% 96% 93%    Intake/Output from previous day:   Intake/Output Summary (Last 24 hours) at 05/11/11 1153 Last data filed at 05/11/11 0347  Gross per 24 hour  Intake    240 ml  Output      0 ml  Net    240 ml    Physical Exam: General: Alert, awake, oriented x3, in no acute distress. HEENT: No bruits, no goiter. Moist mucous membranes, no scleral icterus, no conjunctival pallor. Heart: Regular rate and rhythm, S1/S2 +, no murmurs, rubs, gallops. Lungs: Clear to auscultation bilaterally. No wheezing, no rhonchi, no rales.  Abdomen: Soft, nontender, nondistended, positive bowel sounds. Extremities: No clubbing or cyanosis, no pitting edema,  positive pedal pulses. Neuro: Able to move right and left lower extremity against gravity, right arm paralysis  Lab Results:  Basic Metabolic Panel:    Component Value Date/Time   NA 132* 05/10/2011 0600   K 3.6 05/10/2011 0600   CL 96 05/10/2011 0600   CO2 26 05/10/2011 0600   BUN 11 05/10/2011 0600   CREATININE 0.43* 05/10/2011 0600   GLUCOSE 184* 05/10/2011 0600   CALCIUM 9.4 05/10/2011 0600   CBC:    Component Value Date/Time   WBC 7.2 05/10/2011 0600   HGB 16.4* 05/10/2011 0600   HCT 46.9* 05/10/2011 0600   PLT 144* 05/10/2011 0600   MCV 90.4 05/10/2011 0600   NEUTROABS 5.3 05/08/2011 0219   LYMPHSABS 1.2 05/08/2011 0219   MONOABS 0.4 05/08/2011 0219   EOSABS 0.0 05/08/2011 0219   BASOSABS 0.0 05/08/2011 0219      Lab 05/10/11 0600 05/09/11  0632 05/08/11 0221 05/08/11 0219  WBC 7.2 8.6 -- 7.0  HGB 16.4* 16.2* 17.7* 17.7*  HCT 46.9* 47.8* 52.0* 49.5*  PLT 144* 155 -- 141*  MCV 90.4 91.6 -- 89.5  MCH 31.6 31.0 -- 32.0  MCHC 35.0 33.9 -- 35.8  RDW 13.9 14.0 -- 13.6  LYMPHSABS -- -- -- 1.2  MONOABS -- -- -- 0.4  EOSABS -- -- -- 0.0  BASOSABS -- -- -- 0.0  BANDABS -- -- -- --    Lab 05/10/11 0600 05/09/11 0632 05/08/11 1227 05/08/11 0221 05/08/11 0219  NA 132* 131* 130* 132* 130*  K 3.6 3.8 4.1 4.0 4.1  CL 96 97 95* 98 92*  CO2 26 26 27  -- 26  GLUCOSE 184* 226* 275* 314* 309*  BUN 11 12 15 16 14   CREATININE 0.43* 0.48* 0.52 0.40* 0.48*  CALCIUM 9.4 9.2 9.4 -- 10.1  MG -- -- -- -- --    Lab 05/08/11 0219  INR 0.91  PROTIME --   Cardiac markers:  Lab 05/08/11 0219  CKMB 2.2  TROPONINI <0.30  MYOGLOBIN --   Studies/Results:  Ct Angio Neck W/cm &/or Wo/cm 05/09/2011 IMPRESSION:  Posterior to the angle of the left mandible there is  a necrotic appearing 1.5 x 1.3 x 2.2 cm node suspicious for malignancy. Primary source is not identified.   Ground-glass opacity right upper lobe.  Follow-up chest CT in 3 months recommended.  Proximal right internal carotid artery with irregular plaque and ulceration.  76% diameter stenosis.  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 It is possible there is a tiny string sign at this level although not appreciated on axial thin-section image 139.  Beyond this region, the left internal carotid artery is of marked decreased caliber and significantly irregular.  As this arises from the prominent complex plaque, this is probably related to atherosclerosis rather than dissection.  At the C1 level, the left vertebral is occluded.  This may be related to a dissection and / or atherosclerotic type changes.  Prominent plaque proximal right vertebral artery with moderate to marked narrowing.   CTA HEAD  Findings: IMPRESSION: Left vertebral artery is occluded.  Mild narrowing  and irregularity of the basilar artery most notable mid aspect.  Left internal carotid artery pre cavernous and cavernous segment are significantly narrowed.  This is felt to represent combination of atherosclerotic type changes and proximal stenosis.    Medications: Scheduled Meds:   . aspirin  300 mg Rectal Daily   Or  . aspirin  325 mg Oral Daily  . atorvastatin  40 mg Oral q1800  . citalopram  20 mg Oral Daily  . enoxaparin  40 mg Subcutaneous Q24H  . feeding supplement  237 mL Oral TID WC  . gabapentin  600 mg Oral QHS  . insulin aspart  0-15 Units Subcutaneous TID WC  . insulin glargine  15 Units Subcutaneous QHS  . lisinopril  40 mg Oral Daily  . nicotine  21 mg Transdermal Daily  . rOPINIRole  3 mg Oral QHS  . zolpidem  5 mg Oral QHS   Continuous Infusions:  PRN Meds:.acetaminophen, acetaminophen, labetalol, methocarbamol, ondansetron (ZOFRAN) IV, senna-docusate  Assessment/Plan: Hospital Course:  Principal Problem:  *CVA (cerebral infarction)  - please note the above MRI findings  - pt evaluated and managed by stroke service  - pt was immediately started on BP medication, cholesterol and diabetic medications  - education was provided on risk factor control and PT will continue in an inpatient setting  - follow up on neurology recommendations - pt will need to follow up in an outpatient setting with neurology upon discharge from inpatient rehabilitation   Active Problems:  Hypertension  - please note that the dose of Lisinopril was increased to 40 mg PO QD and pt has responded well  - BP remains stable during the hospitalization   Diabetes mellitus  - uncontrolled with A1C > 10  - lantus was started, 15 units Q HS along with continuation of home medication Metformin  - Glyburide dicontinued   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 Jenne Pane, ENT on call, no emergency need for work up needed at this time - will follow up on their  recommendations   Lung abnormality - noted on CTA - recommendation was to have follow up CT in 3 months - will address to PCP   EDUCATION - test results and diagnostic studies were discussed with patient and pt's family who was present at the bedside - patient and family have verbalized the understanding - questions were answered at the bedside and contact information was provided for additional questions or concerns   LOS: 3 days  Chelsey Gallagher 05/11/2011, 11:53 AM  [facility] Pager: [phone]

## 2011-05-12 ENCOUNTER — Inpatient Hospital Stay (HOSPITAL_COMMUNITY): Payer: Medicare Other

## 2011-05-12 LAB — COMPREHENSIVE METABOLIC PANEL
CO2: 26 mEq/L (ref 19–32)
Calcium: 9.5 mg/dL (ref 8.4–10.5)
Creatinine, Ser: 0.45 mg/dL — ABNORMAL LOW (ref 0.50–1.10)
GFR calc Af Amer: >90 mL/min (ref >90)
GFR calc non Af Amer: >90 mL/min (ref >90)
Glucose, Bld: 147 mg/dL — ABNORMAL HIGH (ref 70–99)

## 2011-05-12 LAB — GLUCOSE, CAPILLARY: Glucose-Capillary: 113 mg/dL — ABNORMAL HIGH (ref 70–99)

## 2011-05-12 LAB — CBC
Hemoglobin: 16.1 g/dL — ABNORMAL HIGH (ref 12.0–15.0)
RBC: 5.19 MIL/uL — ABNORMAL HIGH (ref 3.87–5.11)
WBC: 6.6 10*3/uL (ref 4.0–10.5)

## 2011-05-12 MED ORDER — ASPIRIN 81 MG PO CHEW
81.0000 mg | CHEWABLE_TABLET | Freq: Every day | ORAL | Status: DC
  Administered 2011-05-13 – 2011-05-14 (×2): 81 mg via ORAL
  Filled 2011-05-12 (×2): qty 1

## 2011-05-12 MED ORDER — IOHEXOL 300 MG/ML  SOLN
80.0000 mL | Freq: Once | INTRAMUSCULAR | Status: AC | PRN
  Administered 2011-05-12: 80 mL via INTRAVENOUS

## 2011-05-12 MED ORDER — CEFAZOLIN SODIUM 1-5 GM-% IV SOLN
INTRAVENOUS | Status: AC
  Filled 2011-05-12: qty 50

## 2011-05-12 MED ORDER — IOHEXOL 300 MG/ML  SOLN
200.0000 mL | Freq: Once | INTRAMUSCULAR | Status: AC | PRN
  Administered 2011-05-12: 80 mL via INTRAVENOUS

## 2011-05-12 MED ORDER — MIDAZOLAM HCL 5 MG/5ML IJ SOLN
INTRAMUSCULAR | Status: AC | PRN
  Administered 2011-05-12: 1 mg via INTRAVENOUS

## 2011-05-12 MED ORDER — CEFAZOLIN SODIUM 1-5 GM-% IV SOLN
1.0000 g | Freq: Once | INTRAVENOUS | Status: AC
  Administered 2011-05-12: 1 g via INTRAVENOUS

## 2011-05-12 MED ORDER — HEPARIN SOD (PORK) LOCK FLUSH 100 UNIT/ML IV SOLN
INTRAVENOUS | Status: AC | PRN
  Administered 2011-05-12 (×2): 500 [IU] via INTRAVENOUS

## 2011-05-12 MED ORDER — ASPIRIN 81 MG PO CHEW: 81.0000 mg | CHEWABLE_TABLET | Freq: Every day | ORAL | Status: DC

## 2011-05-12 MED ORDER — SODIUM CHLORIDE 0.9 % IV SOLN
INTRAVENOUS | Status: AC | PRN
  Administered 2011-05-12: 75 mL/h via INTRAVENOUS

## 2011-05-12 MED ORDER — FENTANYL CITRATE 0.05 MG/ML IJ SOLN
INTRAMUSCULAR | Status: AC | PRN
  Administered 2011-05-12: 25 ug via INTRAVENOUS

## 2011-05-12 MED ORDER — IOHEXOL 300 MG/ML  SOLN: 20.0000 mL | INTRAMUSCULAR | Status: AC

## 2011-05-12 NOTE — Progress Notes (Signed)
OT Cancellation Note  Treatment cancelled today due to pt. Gone for procedure, then on bedrest.  Will re-attempt.    Chelsey Gallagher [phone] 05/12/2011, 2:49 PM

## 2011-05-12 NOTE — Consult Note (Signed)
Reason for Consult: Left neck mass Referring Physician: Hospitalist  Chelsey Gallagher is an 61 y.o. female.  HPI: 61 year old admitted with acute CVA had neck angiography that revealed a necrotic node in the left zone 2 region measuring 2.2 cm.  The patient was not previously aware of a neck mass and denies any focal upper aerodigestive symptoms such as pain, hoarseness, or bleeding.  She had noticed some difficulty swallowing certain foods like crackers.  She is still under care for the CVA and soon to go to inpatient rehab.  Past Medical History  Diagnosis Date  . Diabetes mellitus   . Hypertension   . Depression   . PAD (peripheral artery disease)     S/p bypass grafting, unclear exactly where  . Nephrolithiasis     Past Surgical History  Procedure Date  . Total hip arthroplasty     Right  . Abdominal hysterectomy   . Cholecystectomy 1984  . Appendectomy     History reviewed. No pertinent family history.  Social History:  reports that she has been smoking Cigarettes.  She has a 45 pack-year smoking history. She has never used smokeless tobacco. She reports that she does not drink alcohol or use illicit drugs.  Allergies:  Allergies  Allergen Reactions  . Codeine   . Flexeril (Cyclobenzaprine Hcl)     Medications: I have reviewed the patient's current medications.  Results for orders placed during the hospital encounter of 05/08/11 (from the past 48 hour(s))  GLUCOSE, CAPILLARY     Status: Abnormal   Collection Time   05/10/11  9:59 PM      Component Value Range Comment   Glucose-Capillary 155 (*) 70 - 99 (mg/dL)    Comment 1 Documented in Chart      Comment 2 Notify RN     GLUCOSE, CAPILLARY     Status: Abnormal   Collection Time   05/11/11  6:50 AM      Component Value Range Comment   Glucose-Capillary 186 (*) 70 - 99 (mg/dL)    Comment 1 Documented in Chart      Comment 2 Notify RN     GLUCOSE, CAPILLARY     Status: Abnormal   Collection Time   05/11/11 11:30  AM      Component Value Range Comment   Glucose-Capillary 171 (*) 70 - 99 (mg/dL)    Comment 1 Notify RN      Comment 2 Documented in Chart     GLUCOSE, CAPILLARY     Status: Abnormal   Collection Time   05/11/11  4:47 PM      Component Value Range Comment   Glucose-Capillary 146 (*) 70 - 99 (mg/dL)    Comment 1 Notify RN      Comment 2 Documented in Chart     GLUCOSE, CAPILLARY     Status: Abnormal   Collection Time   05/11/11  9:42 PM      Component Value Range Comment   Glucose-Capillary 112 (*) 70 - 99 (mg/dL)   COMPREHENSIVE METABOLIC PANEL     Status: Abnormal   Collection Time   05/12/11  1:01 AM      Component Value Range Comment   Sodium 132 (*) 135 - 145 (mEq/L)    Potassium 3.6  3.5 - 5.1 (mEq/L)    Chloride 97  96 - 112 (mEq/L)    CO2 26  19 - 32 (mEq/L)    Glucose, Bld 147 (*) 70 -  99 (mg/dL)    BUN 12  6 - 23 (mg/dL)    Creatinine, Ser 1.61 (*) 0.50 - 1.10 (mg/dL)    Calcium 9.5  8.4 - 10.5 (mg/dL)    Total Protein 6.6  6.0 - 8.3 (g/dL)    Albumin 3.2 (*) 3.5 - 5.2 (g/dL)    AST 20  0 - 37 (U/L)    ALT 19  0 - 35 (U/L)    Alkaline Phosphatase 88  39 - 117 (U/L)    Total Bilirubin 0.6  0.3 - 1.2 (mg/dL)    GFR calc non Af Amer >90  >90 (mL/min)    GFR calc Af Amer >90  >90 (mL/min)   CBC     Status: Abnormal   Collection Time   05/12/11  1:01 AM      Component Value Range Comment   WBC 6.6  4.0 - 10.5 (K/uL)    RBC 5.19 (*) 3.87 - 5.11 (MIL/uL)    Hemoglobin 16.1 (*) 12.0 - 15.0 (g/dL)    HCT 09.6 (*) 04.5 - 46.0 (%)    MCV 90.8  78.0 - 100.0 (fL)    MCH 31.0  26.0 - 34.0 (pg)    MCHC 34.2  30.0 - 36.0 (g/dL)    RDW 40.9  81.1 - 91.4 (%)    Platelets 138 (*) 150 - 400 (K/uL)   GLUCOSE, CAPILLARY     Status: Abnormal   Collection Time   05/12/11  6:55 AM      Component Value Range Comment   Glucose-Capillary 174 (*) 70 - 99 (mg/dL)   MRSA PCR SCREENING     Status: Normal   Collection Time   05/12/11  8:27 AM      Component Value Range Comment   MRSA by  PCR NEGATIVE  NEGATIVE    GLUCOSE, CAPILLARY     Status: Abnormal   Collection Time   05/12/11 11:48 AM      Component Value Range Comment   Glucose-Capillary 218 (*) 70 - 99 (mg/dL)   GLUCOSE, CAPILLARY     Status: Abnormal   Collection Time   05/12/11  4:44 PM      Component Value Range Comment   Glucose-Capillary 113 (*) 70 - 99 (mg/dL)     No results found.  Review of Systems  Eyes: Positive for double vision.  Neurological: Positive for sensory change, speech change and focal weakness.  All other systems reviewed and are negative.   Blood pressure 178/93, pulse 66, temperature 98.2 F (36.8 C), temperature source Oral, resp. rate 17, height 5\' 3"  (1.6 m), weight 67.087 kg (147 lb 14.4 oz), SpO2 90.00%. Physical Exam  Constitutional: She is oriented to person, place, and time. She appears well-developed and well-nourished.  HENT:  Head: Normocephalic and atraumatic.  Right Ear: External ear normal.  Left Ear: External ear normal.  Nose: Nose normal.  Mouth/Throat: Oropharynx is clear and moist.  Eyes: Conjunctivae are normal. Pupils are equal, round, and reactive to light.  Neck: Normal range of motion. Neck supple.  Cardiovascular: Normal rate.   Respiratory: Effort normal.  GI:       Did not examine.  Genitourinary:       Did not examine.  Musculoskeletal: Normal range of motion.  Neurological: She is alert and oriented to person, place, and time.  Skin: Skin is warm and dry.  Psychiatric: She has a normal mood and affect. Her behavior is normal. Judgment and thought content normal.  Assessment/Plan: Left necrotic cervical lymph node I personally reviewed the neck CT angiogram and examined the patient. The left zone 2 neck mass is difficult to discern on exam.  The node on CT is concerning for potential malignancy.  Thus, I recommended a biopsy be performed.  The best way to biopsy this node will be as an ultrasound guided FNA.  This will be ordered and I will  follow up with her after results return.  The right upper lobe ground glass area would not seem to be related.  Chelsey Gallagher 05/12/2011, 6:37 PM

## 2011-05-12 NOTE — Progress Notes (Signed)
PT Cancellation Note  Treatment cancelled today due to patient receiving procedure or test. Pt out of room, spoke with RN and pt down in interventional radiology. Will be under mobility restrictions post procedure. Will attempt tomorrow as appropriate. Thanks!  Chelsey Gallagher (Chelsey Gallagher) Carleene Mains PT, DPT Acute Rehabilitation [phone]

## 2011-05-12 NOTE — Procedures (Signed)
S/P 4 vessel cerebral arteriogram  RT CFA approasch.Preliminary findings  1. Occluded LI ICA prox with a delayed string sign 2. Occluded Lt VA at VBJ 3 65 to 70 % stenosis rt ICA prox. 4. Lt MCA and  Lt ACA fill partially from RT ICA and LT PCOM retrogradely.

## 2011-05-12 NOTE — ED Notes (Signed)
O2 d/c'd 

## 2011-05-12 NOTE — Progress Notes (Signed)
Rehab admissions - Continuing to follow.  Noted patient was held on acute for further follow-up.  Noted patient to have an arteriogram today.  We will reconsider for inpatient rehab once all work-up and follow-up has been completed and/or addressed.  Pager [phone]

## 2011-05-12 NOTE — ED Notes (Signed)
Sheath pulled by Pearlean Brownie, RT.  Holding pressure.  Pt tolerating well.

## 2011-05-12 NOTE — Progress Notes (Signed)
Pt. Unable to keep right leg flat post-angiogram secondary to restless leg syndrome. Dr. Izola Price notified; order to place right ankle restraint until off bedrest. Pt. In agreement to restraint after explaination given.

## 2011-05-12 NOTE — Progress Notes (Signed)
Patient ID: Chelsey Gallagher, female   DOB: 30-Nov-1950, 61 y.o.   MRN: [id number]  Subjective: No events overnight. Patient denies chest pain, shortness of breath, abdominal pain. Had bowel movement and reports ambulating.  Objective:  Vital signs in last 24 hours:  Filed Vitals:   05/12/11 1316 05/12/11 1321 05/12/11 1326 05/12/11 1332  BP: 167/70 169/72 163/79 178/66  Pulse: 65 66 63 62  Temp:      TempSrc:      Resp: 16 17 18 16   Height:      Weight:      SpO2: 95% 93% 93% 92%    Intake/Output from previous day:   Intake/Output Summary (Last 24 hours) at 05/12/11 1338 Last data filed at 05/12/11 0500  Gross per 24 hour  Intake    240 ml  Output      0 ml  Net    240 ml    Physical Exam: General: Alert, awake, oriented x3, in no acute distress. HEENT: No bruits, no goiter. Moist mucous membranes, no scleral icterus, no conjunctival pallor. Heart: Regular rate and rhythm, S1/S2 +, no murmurs, rubs, gallops. Lungs: Clear to auscultation bilaterally. No wheezing, no rhonchi, no rales.  Abdomen: Soft, nontender, nondistended, positive bowel sounds. Extremities: No clubbing or cyanosis, no pitting edema,  positive pedal pulses. Neuro: Grossly nonfocal.  Lab Results:  Basic Metabolic Panel:    Component Value Date/Time   NA 132* 05/12/2011 0101   K 3.6 05/12/2011 0101   CL 97 05/12/2011 0101   CO2 26 05/12/2011 0101   BUN 12 05/12/2011 0101   CREATININE 0.45* 05/12/2011 0101   GLUCOSE 147* 05/12/2011 0101   CALCIUM 9.5 05/12/2011 0101   CBC:    Component Value Date/Time   WBC 6.6 05/12/2011 0101   HGB 16.1* 05/12/2011 0101   HCT 47.1* 05/12/2011 0101   PLT 138* 05/12/2011 0101   MCV 90.8 05/12/2011 0101   NEUTROABS 5.3 05/08/2011 0219   LYMPHSABS 1.2 05/08/2011 0219   MONOABS 0.4 05/08/2011 0219   EOSABS 0.0 05/08/2011 0219   BASOSABS 0.0 05/08/2011 0219      Lab 05/12/11 0101 05/10/11 0600 05/09/11 0632 05/08/11 0221 05/08/11 0219  WBC 6.6 7.2 8.6 -- 7.0  HGB 16.1*  16.4* 16.2* 17.7* 17.7*  HCT 47.1* 46.9* 47.8* 52.0* 49.5*  PLT 138* 144* 155 -- 141*  MCV 90.8 90.4 91.6 -- 89.5  MCH 31.0 31.6 31.0 -- 32.0  MCHC 34.2 35.0 33.9 -- 35.8  RDW 13.7 13.9 14.0 -- 13.6  LYMPHSABS -- -- -- -- 1.2  MONOABS -- -- -- -- 0.4  EOSABS -- -- -- -- 0.0  BASOSABS -- -- -- -- 0.0  BANDABS -- -- -- -- --    Lab 05/12/11 0101 05/10/11 0600 05/09/11 0632 05/08/11 1227 05/08/11 0221 05/08/11 0219  NA 132* 132* 131* 130* 132* --  K 3.6 3.6 3.8 4.1 4.0 --  CL 97 96 97 95* 98 --  CO2 26 26 26 27  -- 26  GLUCOSE 147* 184* 226* 275* 314* --  BUN 12 11 12 15 16  --  CREATININE 0.45* 0.43* 0.48* 0.52 0.40* --  CALCIUM 9.5 9.4 9.2 9.4 -- 10.1  MG -- -- -- -- -- --    Lab 05/08/11 0219  INR 0.91  PROTIME --   Cardiac markers:  Lab 05/08/11 0219  CKMB 2.2  TROPONINI <0.30  MYOGLOBIN --   No components found with this basename: POCBNP:3 Recent Results (from the past 240  hour(s))  MRSA PCR SCREENING     Status: Normal   Collection Time   05/12/11  8:27 AM      Component Value Range Status Comment   MRSA by PCR NEGATIVE  NEGATIVE  Final     Studies/Results: No results found.  Medications: Scheduled Meds:   . aspirin  81 mg Oral Daily  . atorvastatin  40 mg Oral q1800  . ceFAZolin      .  ceFAZolin (ANCEF) IV  1 g Intravenous Once  . citalopram  20 mg Oral Daily  . enoxaparin  40 mg Subcutaneous Q24H  . feeding supplement  237 mL Oral TID WC  . gabapentin  600 mg Oral QHS  . insulin aspart  0-15 Units Subcutaneous TID WC  . insulin glargine  15 Units Subcutaneous QHS  . iohexol  20 mL Oral Q1 Hr x 2  . lisinopril  40 mg Oral Daily  . nicotine  21 mg Transdermal Daily  . rOPINIRole  3 mg Oral QHS  . zolpidem  5 mg Oral QHS  . DISCONTD: aspirin  81 mg Oral Daily  . DISCONTD: aspirin  300 mg Rectal Daily  . DISCONTD: aspirin  325 mg Oral Daily   Continuous Infusions:   . sodium chloride 75 mL/hr (05/12/11 1235)   PRN Meds:.sodium chloride,  acetaminophen, acetaminophen, fentaNYL, heparin lock flush, iohexol, labetalol, methocarbamol, midazolam, ondansetron (ZOFRAN) IV, senna-docusate  Assessment/Plan:  Principal Problem:  *CVA (cerebral infarction)  - please note the above MRI findings  - pt evaluated and managed by stroke service  - pt was immediately started on BP medication, cholesterol and diabetic medications  - education was provided on risk factor control and PT will continue in an inpatient setting  - cerebral angiogram this AM   Active Problems:  Hypertension  - please note that the dose of Lisinopril was increased to 40 mg PO QD and pt has responded well  - BP remains stable during the hospitalization   Diabetes mellitus  - uncontrolled with A1C > 10  - lantus was started, 15 units Q HS along with continuation of home medication Metformin  - Glyburide dicontinued   Mandibular mass  - noted on CTA of the neck  - I have spoke with Dr. Jenne Pane, ENT on call, no emergency need for work up needed at this time  - will follow up on their recommendations   Lung abnormality  - noted on CTA  - recommendation was to have follow up CT in 3 months  - CT chest/ abdomen and pelvis this AM  EDUCATION  - test results and diagnostic studies were discussed with patient and pt's family who was present at the bedside  - patient and family have verbalized the understanding  - questions were answered at the bedside and contact information was provided for additional questions or concerns   LOS: 4 days   MAGICK-Rockne Dearinger 05/12/2011, 1:38 PM  [facility] Pager: [phone]

## 2011-05-12 NOTE — ED Notes (Signed)
[name] explaining findings to pt and daughter.

## 2011-05-12 NOTE — ED Notes (Signed)
Right groin and pulse stable 

## 2011-05-12 NOTE — Progress Notes (Signed)
Speech Language/Pathology SLP Cancellation Note  Treatment cancelled today due to positioning restrictions following IR procedure.  Will f/u 3/12.  Darrol Poke 05/12/2011, 2:13 PM

## 2011-05-12 NOTE — Progress Notes (Signed)
History: Chelsey Gallagher is an 61 y.o. female who reports that she laid down about 10pm and felt fine at that time. Got up about 1015 and felt nauseous and dizzy. Had some vomiting. Looked in the mirror and noted that she had a right facial droop. Then noted numbness in her right upper extremity. It went to her right lower extremity and then to the right ear. She attempted to call her daughter but had difficulty managing to et to the phone and then dial. When speaking noticed that her speech was slurred. Her daughter called EMS. EMS initiated the code stroke. She presented beyond IV TPA window.   LSN: [id number]  tPA Given: No: Outside time window  mRankin: 0 Level of Consciousness (1a. ): Alert, keenly responsive (03/10 2000) LOC Questions (1b. ): Answers both questions correctly (03/10 2000) LOC Commands (1c. ): Performs one task correctly (03/10 2000) Best Gaze (2. ): Normal (03/10 2000) Visual (3. ): No visual loss (03/10 2000) Facial Palsy (4. ): Minor paralysis (03/10 2000) Motor Arm, Left (5a. ): No drift (03/10 2000) Motor Arm, Right (5b. ): No movement (03/10 2000) Motor Leg, Left (6a. ): No drift (03/10 2000) Motor Leg, Right (6b. ): Drift (03/10 2000) Limb Ataxia (7. ): Present in one limb (03/10 2000) Sensory (8. ): Mild-to-moderate sensory loss, patient feels pinprick is less sharp or is dull on the affected side, or there is a loss of superficial pain with pinprick, but patient is aware of being touched (03/10 2000) Best Language (9. ): No aphasia (03/10 2000) Dysarthria (10. ): Mild-to-moderate dysarthria, patient slurs at least some words and, at worst, can be understood with some difficulty (03/10 2000) Inattention/Extinction: No Abnormality (03/10 2000) Total: 10  (03/10 2000)  Subjective: For cerebral angiogram today.  Diploplia persists patch off.  Speech fluency improving, unable to use R UE.  Can move RLE better today.    Objective: BP 96/63  Pulse 65  Temp(Src) 98.5  F (36.9 C) (Oral)  Resp 17  Ht 5\' 3"  (1.6 m)  Wt 67.087 kg (147 lb 14.4 oz)  BMI 26.20 kg/m2  SpO2 94%   CBGs  Basename 05/12/11 0655 05/11/11 2142 05/11/11 1647 05/11/11 1130 05/11/11 0650 05/10/11 2159 05/10/11 1612 05/10/11 1147 05/10/11 0810 05/10/11 0634  GLUCAP 174* 112* 146* 171* 186* 155* 123* 213* 210* 183*   Diet: D3, thin  Activity: up with assistance  DVT Prophylaxis: lovenox  Medications: Scheduled:    . aspirin  300 mg Rectal Daily   Or  . aspirin  325 mg Oral Daily  . atorvastatin  40 mg Oral q1800  . citalopram  20 mg Oral Daily  . enoxaparin  40 mg Subcutaneous Q24H  . feeding supplement  237 mL Oral TID WC  . gabapentin  600 mg Oral QHS  . insulin aspart  0-15 Units Subcutaneous TID WC  . insulin glargine  15 Units Subcutaneous QHS  . iohexol  20 mL Oral Q1 Hr x 2  . lisinopril  40 mg Oral Daily  . nicotine  21 mg Transdermal Daily  . rOPINIRole  3 mg Oral QHS  . zolpidem  5 mg Oral QHS   Neurologic Exam: Mental Status: Alert, oriented, thought content appropriate.  Speech fluent without evidence of aphasia. Mild dysarthria.   Able to follow 3 step commands without difficulty. Cranial Nerves: II- Visual fields grossly intact, with both eyes open, has vertical diploplia.  III/IV/VI-Extraocular movements intact.  Nystagmus prominent with left lateral  gaze.  Pupils reactive bilaterally. V/VII-Smile symmetric VIII-hearing grossly intact IX/X-normal gag XI-bilateral shoulder shrug XII-midline tongue extension Motor: 0/5 RUE, increased tone, 5/5 LUE normal tone, 3-4/5 RLE with increased tone, 5/5 LLE. Sensory: diminished sensation on the right. Deep Tendon Reflexes: 2+ and symmetric throughout, 0 achilles Plantars: upgoing on the right Cerebellar: Normal finger-to-nose L, normal rapid alternating movements L.  Unable to perform on the right.  Lab Results: Basic Metabolic Panel:  Lab 05/12/11 8119 05/10/11 0600  NA 132* 132*  K 3.6 3.6  CL 97  96  CO2 26 26  GLUCOSE 147* 184*  BUN 12 11  CREATININE 0.45* 0.43*  CALCIUM 9.5 9.4  MG -- --  PHOS -- --   Liver Function Tests:  Lab 05/12/11 0101 05/08/11 0219  AST 20 19  ALT 19 21  ALKPHOS 88 104  BILITOT 0.6 0.5  PROT 6.6 7.6  ALBUMIN 3.2* 3.7   CBC:  Lab 05/12/11 0101 05/10/11 0600 05/08/11 0219  WBC 6.6 7.2 --  NEUTROABS -- -- 5.3  HGB 16.1* 16.4* --  HCT 47.1* 46.9* --  MCV 90.8 90.4 --  PLT 138* 144* --   Cardiac Enzymes:  Lab 05/08/11 0219  CKTOTAL 50  CKMB 2.2  CKMBINDEX --  TROPONINI <0.30   CBG:  Lab 05/12/11 0655 05/11/11 2142 05/11/11 1647 05/11/11 1130 05/11/11 0650 05/10/11 2159  GLUCAP 174* 112* 146* 171* 186* 155*   Hemoglobin A1C:  Lab 05/08/11 1227  HGBA1C 10.2*   Fasting Lipid Panel:  Lab 05/08/11 1230  CHOL 216*  HDL 25*  LDLCALC 135*  TRIG 279*  CHOLHDL 8.6  LDLDIRECT --   Coagulation:  Lab 05/08/11 0219  LABPROT 12.5  INR 0.91   Anemia Panel:  Lab 05/08/11 1227  VITAMINB12 --  FOLATE --  FERRITIN 115  TIBC Not calculated due to Iron <10.  IRON <10*  RETICCTPCT --   Study Results: 05/09/2011  CT ANGIOGRAPHY  NECK  Findings:  Normal origin of great vessels from the aortic arch. Prominent shaggy plaque of the undersurface of the aortic arch.  Plaque with mild narrowing right common carotid artery.  Proximal right internal carotid artery with irregular plaque and ulceration.  76% diameter stenosis.  Distal right internal carotid artery calcified plaque with mild narrowing.  Plaque left common carotid artery with mild narrowing.  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 It is possible there is a tiny string sign at this level although not appreciated on axial thin-section image 139.  Beyond this region, the left internal carotid artery is of marked decreased caliber and significantly irregular.  As this arises from the prominent complex plaque, this is probably related to atherosclerosis rather  than dissection.  Moderate irregular plaque with mild to moderate narrowing left subclavian artery.  Mild to moderate narrowing proximal left vertebral artery.  Mild to moderate narrowing of the portions of the cervical segment of the left vertebral artery.  At the C1 level, the left vertebral is occluded.  This may be related to a dissection and / or atherosclerotic type changes.  Proximal right subclavian artery shaggy plaque with mild to moderate narrowing.  Prominent plaque proximal right vertebral artery with moderate to marked narrowing.  Plaque along the cervical segment of the right vertebral artery without high-grade stenosis.  Thyroid goiter wraps around posteriorly.  No focal dominant mass identified.  Ground-glass opacity right upper lobe.  Follow-up chest CT in 3 months recommended.  Posterior to the angle of  the left mandible there is a necrotic appearing 1.5 x 1.3 x 2.2 cm node suspicious for malignancy. Primary source is not identified.  ENT consultation with direct visualization to exclude mucosal abnormality may be considered.  Polypoid opacification inferior aspect right maxillary sinus. Dental caries / peri apical lucency more notable upper right teeth.   Review of the MIP images confirms the above findings.  IMPRESSION:  1. Posterior to the angle of the left mandible there is a necrotic appearing 1.5 x 1.3 x 2.2 cm node suspicious for malignancy. Primary source is not identified.  ENT consultation with direct visualization to exclude mucosal abnormality may be considered.  2. Ground-glass opacity right upper lobe.  Follow-up chest CT in 3 months recommended.  3. Proximal right internal carotid artery with irregular plaque and ulceration.  76% diameter stenosis.   4. 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 It is possible there is a tiny string sign at this level although not appreciated on axial thin-section image 139. 5.  Beyond this region, the left internal  carotid artery is of marked decreased caliber and significantly irregular.  As this arises from the prominent complex plaque, this is probably related to atherosclerosis rather than dissection.   6. At the C1 level, the left vertebral is occluded.  This may be related to a dissection and / or atherosclerotic type changes.   7.Prominent plaque proximal right vertebral artery with moderate to marked narrowing.  Fuller Canada, M.D.  05/09/11 CTA HEAD  Findings:  MR detected left paracentral medulla infarct and tiny left cerebellar infarct not well delineated on the current CT. Small vessel disease type changes.  No hydrocephalus or intracranial enhancing lesion.  No intracranial hemorrhage.  Left internal carotid artery pre cavernous and cavernous segment are significantly narrowed.  This is felt to represent combination of atherosclerotic type changes and proximal stenosis.  Right internal carotid artery cavernous segment plaque with mild to slightly moderate irregularity.   Mild ectasia of the right carotid terminus. No discrete aneurysm.  Left vertebral artery is occluded.  Mild narrowing and irregularity of the basilar artery most notable mid aspect.   Review of the MIP images confirms the above findings.  IMPRESSION: Left vertebral artery is occluded.  Mild narrowing and irregularity of the basilar artery most notable mid aspect.  Left internal carotid artery pre cavernous and cavernous segment are significantly narrowed.  This is felt to represent combination of atherosclerotic type changes and proximal stenosis.  Fuller Canada, M.D.  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 Tiny acute non hemorrhagic posterior left cerebellar infarct. Mild to moderate nonspecific white matter type changes most consistent with result of small vessel disease in this hypertensive diabetic patient. This involves the subcortical periventricular region as well as a central pons. Cava  septum pellucidum et vergae incidentally noted. No hydrocephalus. Abnormal appearance of the left vertebral artery and left carotid artery. Please see below. Polypoid opacification inferior aspect right maxillary sinus. Minimal paranasal sinus mucosal thickening. No intracranial mass lesion detected on this unenhanced motion degraded exam.  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 Tiny acute non hemorrhagic posterior left cerebellar infarct.  Fuller Canada, M.D.  05/09/11 MRA HEAD Findings: Exam limited by motion. Left internal carotid artery is small in caliber. This may be related to proximal stenosis. Superimposed atherosclerotic type changes (dissection not excluded).  Nonvisualization left vertebral artery and left PICA. This may be related to the occlusion of  atherosclerosis. Dissection not excluded. Ectatic right internal carotid artery. Small bulge proximal cavernous segment may be related to atherosclerosis rather than aneurysm. Stability can be confirmed on follow-up. Prominent ectasia right carotid terminus. No definitive aneurysm. Evaluation limited by motion. Mild to moderate narrowing of the M1 segment of the right middle cerebral artery. Mild to moderate narrowing M1 segment left middle cerebral artery. Hypoplastic versus markedly diseased A1 segment of the left anterior cerebral artery.  Middle cerebral artery moderate branch vessel irregularity. Mild narrowing distal right vertebral artery. Mild to moderate irregularity with areas of mild to moderate narrowing basilar artery.  Nonvisualization right AICA. Fetal type origin of the right posterior cerebral artery. Moderate  to marked narrowing involving portions of the posterior cerebral artery bilaterally.  IMPRESSION: Left internal carotid artery is small in caliber. This may be related to proximal stenosis. Superimposed atherosclerotic type changes (dissection not excluded). Nonvisualization left vertebral artery  and left PICA. This may be related to the occlusion of atherosclerosis. Dissection not excluded.  Fuller Canada, M.D.   05/09/11 Carotid Doppler: Preliminary report: Right: 60-79% internal carotid artery stenosis. Left: The internal carotid appears to be fed by a retrograde branch of the external carotid. Bilateral: Vertebral artery flow is antegrade. The left vertebral waveform becomes spiked in the distal portion consistent with a more distal obstruction.  05/08/11 2 D ECHO: Left ventricle moderate LVH. Systolic function of 65% to 70%.  Mid-cavity gradient, peak 33 mmHg.grade 1diastolic dysfunction.  - Aortic valve: Trileaflet; severely calcified leaflets. Mild to moderate aortic stenosis. Mean gradient:24mm Hg(S). Peak gradient: 32mm Hg (S). Valve area:1.36cm^2(VTI). - Mitral valve: Mildly calcified annulus. - Left atrium: The atrium was mildly dilated. - Right ventricle: The cavity size was normal. Systolic function was normal. - Pulmonary arteries: No complete TR doppler jet so unable to estimate PA systolic pressure. - Inferior vena cava: The vessel was normal in size; the respirophasic diameter changes were in the normal range (=50%); findings are consistent with normal central venous pressure. Impressions: Normal LV size with moderate LV hypertrophy. Vigorous systolic function, EF 65-70% with mid cavity gradient to 33 mmHg. The aortic valve was severely calcified with mild to moderate stenosis (mild by mean gradient, moderate visually and by valve area). Normal RV size and systolic function.   Therapies: rec. inpt rehab.   Assessment: 61 y.o. 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 CTA head neck concerning for proximal right internal carotid artery with irregular plaque and ulceration, 76% diameter stenosis;  left internal carotid artery with proximal focal occlusion; left vertebral is occluded at C1; proximal right  vertebral artery with moderate to marked narrowing. Continues to have diploplia, R UE paresis and weakness RLE. 2. Mild - mod AS on 2D Echo 3. necrotic appearing 1.5 x 1.3 x 2.2 cm node suspicious for malignancy posterior to the angle of the left mandible. Primary source is not identified.   4. Ground-glass opacity right upper lobe.  Follow-up chest CT in 3 months recommended.  5. Diabetes Mellitus- uncontrolled; A1C 10.2 6. HTN- controlled. 7. Hyperlipidemia; not at goal <80; LDL 135 8. Tobacco abuse 9. Depression  Stroke Risk Factors - diabetes mellitus, hyperlipidemia, hypertension and smoking  Plan: 1. IR cerebral angiogram Monday to evaluate abnormal carotid and vertebral disease noted on CTA in setting of recent CVA. 2. Defer transfer to CIR due to need for further evaluation of carotid disease and AS. 3 Primary team to contact ENT re: mandibular necrotic  mass 4. Consider cardiology consult to evaluate AS 5. Will require f/u CT chest due to opacity RUL on CTA.  Scheduled for CT abd and pelvis today. 6. Aggressive risk factor reduction, aggressive DM management. 7. Smoking cessation consult. 8. LDL not at goal on home dose pravachol 40, change to lipitor 40.  Patient discussed with [name] who agrees with the above plan.   LOS: 4 days   Marya Fossa PA-C [facility] [phone] 05/12/2011  10:52 AM

## 2011-05-12 NOTE — ED Notes (Signed)
O2 increased to 4L/[place]

## 2011-05-13 LAB — BASIC METABOLIC PANEL
BUN: 12 mg/dL (ref 6–23)
CO2: 27 mEq/L (ref 19–32)
Calcium: 9.8 mg/dL (ref 8.4–10.5)
Chloride: 95 mEq/L — ABNORMAL LOW (ref 96–112)
Creatinine, Ser: 0.45 mg/dL — ABNORMAL LOW (ref 0.50–1.10)
GFR calc Af Amer: >90 mL/min (ref >90)
GFR calc non Af Amer: >90 mL/min (ref >90)
Glucose, Bld: 139 mg/dL — ABNORMAL HIGH (ref 70–99)
Potassium: 3.7 mEq/L (ref 3.5–5.1)
Sodium: 129 mEq/L — ABNORMAL LOW (ref 135–145)

## 2011-05-13 LAB — CBC
Hemoglobin: 16 g/dL — ABNORMAL HIGH (ref 12.0–15.0)
RBC: 5.03 MIL/uL (ref 3.87–5.11)

## 2011-05-13 LAB — GLUCOSE, CAPILLARY: Glucose-Capillary: 153 mg/dL — ABNORMAL HIGH (ref 70–99)

## 2011-05-13 MED ORDER — GLUCERNA SHAKE PO LIQD
237.0000 mL | Freq: Two times a day (BID) | ORAL | Status: DC
  Administered 2011-05-13 – 2011-05-14 (×2): 237 mL via ORAL

## 2011-05-13 NOTE — Progress Notes (Signed)
History: Chelsey Gallagher is an 61 y.o. female who reports that she laid down about 10pm and felt fine at that time. Got up about 1015 and felt nauseous and dizzy. Had some vomiting. Looked in the mirror and noted that she had a right facial droop. Then noted numbness in her right upper extremity. It went to her right lower extremity and then to the right ear. She attempted to call her daughter but had difficulty managing to et to the phone and then dial. When speaking noticed that her speech was slurred. Her daughter called EMS. EMS initiated the code stroke. She presented beyond IV TPA window.   LSN: [id number]  tPA Given: No: Outside time window  mRankin: 0 Level of Consciousness (1a. ): Alert, keenly responsive (03/12 0800) LOC Questions (1b. ): Answers both questions correctly (03/12 0800) LOC Commands (1c. ): Performs both tasks correctly (03/12 0800) Best Gaze (2. ): Normal (03/12 0800) Visual (3. ): Partial hemianopia (03/12 0800) Facial Palsy (4. ): Minor paralysis (03/12 0800) Motor Arm, Left (5a. ): No drift (03/12 0800) Motor Arm, Right (5b. ): No movement (03/12 0800) Motor Leg, Left (6a. ): No drift (03/12 0800) Motor Leg, Right (6b. ): Some effort against gravity (03/12 0800) Limb Ataxia (7. ): Present in two limbs (03/12 0800) Sensory (8. ): Mild-to-moderate sensory loss, patient feels pinprick is less sharp or is dull on the affected side, or there is a loss of superficial pain with pinprick, but patient is aware of being touched (03/12 0800) Best Language (9. ): No aphasia (03/12 0800) Dysarthria (10. ): Mild-to-moderate dysarthria, patient slurs at least some words and, at worst, can be understood with some difficulty (03/12 0800) Inattention/Extinction: No Abnormality (03/12 0800) Total: 12  (03/12 0800)  Subjective: had cerebral angiogram yesterday  Diploplia persists patch off.  Speech fluency improving, unable to use R UE.  Can move RLE better today.   /P 4 vessel cerebral  arteriogram  RT CFA approasch.Preliminary findings  1. Occluded LI ICA prox with a delayed string sign  2. Occluded Lt VA at VBJ  3 65 to 70 % stenosis rt ICA prox.  4. Lt MCA and Lt ACA fill partially from RT ICA and LT PCOM retrogradely.      Objective: BP 175/78  Pulse 77  Temp(Src) 98.3 F (36.8 C) (Oral)  Resp 17  Ht 5\' 3"  (1.6 m)  Wt 67.087 kg (147 lb 14.4 oz)  BMI 26.20 kg/m2  SpO2 90%   CBGs  Basename 05/13/11 0700 05/12/11 2139 05/12/11 1644 05/12/11 1148 05/12/11 0655 05/11/11 2142 05/11/11 1647 05/11/11 1130 05/11/11 0650 05/10/11 2159  GLUCAP 110* 117* 113* 218* 174* 112* 146* 171* 186* 155*   Diet: D3, thin  Activity: up with assistance  DVT Prophylaxis: lovenox  Medications: Scheduled:    . aspirin  81 mg Oral Daily  . atorvastatin  40 mg Oral q1800  . ceFAZolin      .  ceFAZolin (ANCEF) IV  1 g Intravenous Once  . citalopram  20 mg Oral Daily  . enoxaparin  40 mg Subcutaneous Q24H  . feeding supplement  237 mL Oral TID WC  . gabapentin  600 mg Oral QHS  . insulin aspart  0-15 Units Subcutaneous TID WC  . insulin glargine  15 Units Subcutaneous QHS  . iohexol  20 mL Oral Q1 Hr x 2  . lisinopril  40 mg Oral Daily  . nicotine  21 mg Transdermal Daily  . rOPINIRole  3 mg Oral QHS  . zolpidem  5 mg Oral QHS  . DISCONTD: aspirin  81 mg Oral Daily  . DISCONTD: aspirin  300 mg Rectal Daily  . DISCONTD: aspirin  325 mg Oral Daily   Neurologic Exam: Mental Status: Alert, oriented, thought content appropriate.  Speech fluent without evidence of aphasia. Mild dysarthria.   Able to follow 3 step commands without difficulty. Cranial Nerves: II- Visual fields grossly intact, with both eyes open, has vertical diploplia.  III/IV/VI-Extraocular movements intact.  Nystagmus prominent with left lateral gaze.  Pupils reactive bilaterally. V/VII-Smile symmetric VIII-hearing grossly intact IX/X-normal gag XI-bilateral shoulder shrug XII-midline tongue  extension Motor: 0/5 RUE, increased tone, 5/5 LUE normal tone, 3-4/5 RLE with increased tone, 5/5 LLE. Sensory: diminished sensation on the right. Deep Tendon Reflexes: 2+ and symmetric throughout, 0 achilles Plantars: upgoing on the right Cerebellar: Normal finger-to-nose L, normal rapid alternating movements L.  Unable to perform on the right.  Lab Results: Basic Metabolic Panel:  Lab 05/13/11 1610 05/12/11 0101  NA 129* 132*  K 3.7 3.6  CL 95* 97  CO2 27 26  GLUCOSE 139* 147*  BUN 12 12  CREATININE 0.45* 0.45*  CALCIUM 9.8 9.5  MG -- --  PHOS -- --   Liver Function Tests:  Lab 05/12/11 0101 05/08/11 0219  AST 20 19  ALT 19 21  ALKPHOS 88 104  BILITOT 0.6 0.5  PROT 6.6 7.6  ALBUMIN 3.2* 3.7   CBC:  Lab 05/13/11 0500 05/12/11 0101 05/08/11 0219  WBC 7.3 6.6 --  NEUTROABS -- -- 5.3  HGB 16.0* 16.1* --  HCT 45.5 47.1* --  MCV 90.5 90.8 --  PLT 154 138* --   Cardiac Enzymes:  Lab 05/08/11 0219  CKTOTAL 50  CKMB 2.2  CKMBINDEX --  TROPONINI <0.30   CBG:  Lab 05/13/11 0700 05/12/11 2139 05/12/11 1644 05/12/11 1148 05/12/11 0655 05/11/11 2142  GLUCAP 110* 117* 113* 218* 174* 112*   Hemoglobin A1C:  Lab 05/08/11 1227  HGBA1C 10.2*   Fasting Lipid Panel:  Lab 05/08/11 1230  CHOL 216*  HDL 25*  LDLCALC 135*  TRIG 279*  CHOLHDL 8.6  LDLDIRECT --   Coagulation:  Lab 05/08/11 0219  LABPROT 12.5  INR 0.91   Anemia Panel:  Lab 05/08/11 1227  VITAMINB12 --  FOLATE --  FERRITIN 115  TIBC Not calculated due to Iron <10.  IRON <10*  RETICCTPCT --   Study Results: 05/09/2011  CT ANGIOGRAPHY  NECK  Findings:  Normal origin of great vessels from the aortic arch. Prominent shaggy plaque of the undersurface of the aortic arch.  Plaque with mild narrowing right common carotid artery.  Proximal right internal carotid artery with irregular plaque and ulceration.  76% diameter stenosis.  Distal right internal carotid artery calcified plaque with mild  narrowing.  Plaque left common carotid artery with mild narrowing.  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 It is possible there is a tiny string sign at this level although not appreciated on axial thin-section image 139.  Beyond this region, the left internal carotid artery is of marked decreased caliber and significantly irregular.  As this arises from the prominent complex plaque, this is probably related to atherosclerosis rather than dissection.  Moderate irregular plaque with mild to moderate narrowing left subclavian artery.  Mild to moderate narrowing proximal left vertebral artery.  Mild to moderate narrowing of the portions of the cervical segment of the left vertebral artery.  At the C1 level, the left vertebral is occluded.  This may be related to a dissection and / or atherosclerotic type changes.  Proximal right subclavian artery shaggy plaque with mild to moderate narrowing.  Prominent plaque proximal right vertebral artery with moderate to marked narrowing.  Plaque along the cervical segment of the right vertebral artery without high-grade stenosis.  Thyroid goiter wraps around posteriorly.  No focal dominant mass identified.  Ground-glass opacity right upper lobe.  Follow-up chest CT in 3 months recommended.  Posterior to the angle of the left mandible there is a necrotic appearing 1.5 x 1.3 x 2.2 cm node suspicious for malignancy. Primary source is not identified.  ENT consultation with direct visualization to exclude mucosal abnormality may be considered.  Polypoid opacification inferior aspect right maxillary sinus. Dental caries / peri apical lucency more notable upper right teeth.   Review of the MIP images confirms the above findings.  IMPRESSION:  1. Posterior to the angle of the left mandible there is a necrotic appearing 1.5 x 1.3 x 2.2 cm node suspicious for malignancy. Primary source is not identified.  ENT consultation with direct visualization to exclude  mucosal abnormality may be considered.  2. Ground-glass opacity right upper lobe.  Follow-up chest CT in 3 months recommended.  3. Proximal right internal carotid artery with irregular plaque and ulceration.  76% diameter stenosis.   4. 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 It is possible there is a tiny string sign at this level although not appreciated on axial thin-section image 139. 5.  Beyond this region, the left internal carotid artery is of marked decreased caliber and significantly irregular.  As this arises from the prominent complex plaque, this is probably related to atherosclerosis rather than dissection.   6. At the C1 level, the left vertebral is occluded.  This may be related to a dissection and / or atherosclerotic type changes.   7.Prominent plaque proximal right vertebral artery with moderate to marked narrowing.  Fuller Canada, M.D.  05/09/11 CTA HEAD  Findings:  MR detected left paracentral medulla infarct and tiny left cerebellar infarct not well delineated on the current CT. Small vessel disease type changes.  No hydrocephalus or intracranial enhancing lesion.  No intracranial hemorrhage.  Left internal carotid artery pre cavernous and cavernous segment are significantly narrowed.  This is felt to represent combination of atherosclerotic type changes and proximal stenosis.  Right internal carotid artery cavernous segment plaque with mild to slightly moderate irregularity.   Mild ectasia of the right carotid terminus. No discrete aneurysm.  Left vertebral artery is occluded.  Mild narrowing and irregularity of the basilar artery most notable mid aspect.   Review of the MIP images confirms the above findings.  IMPRESSION: Left vertebral artery is occluded.  Mild narrowing and irregularity of the basilar artery most notable mid aspect.  Left internal carotid artery pre cavernous and cavernous segment are significantly narrowed.  This is felt to represent  combination of atherosclerotic type changes and proximal stenosis.  Fuller Canada, M.D.  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 Tiny acute non hemorrhagic posterior left cerebellar infarct. Mild to moderate nonspecific white matter type changes most consistent with result of small vessel disease in this hypertensive diabetic patient. This involves the subcortical periventricular region as well as a central pons. Cava septum pellucidum et vergae incidentally noted. No hydrocephalus. Abnormal appearance of the left vertebral artery and left carotid artery. Please see  below. Polypoid opacification inferior aspect right maxillary sinus. Minimal paranasal sinus mucosal thickening. No intracranial mass lesion detected on this unenhanced motion degraded exam.  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 Tiny acute non hemorrhagic posterior left cerebellar infarct.  Fuller Canada, M.D.  05/09/11 MRA HEAD Findings: Exam limited by motion. Left internal carotid artery is small in caliber. This may be related to proximal stenosis. Superimposed atherosclerotic type changes (dissection not excluded).  Nonvisualization left vertebral artery and left PICA. This may be related to the occlusion of atherosclerosis. Dissection not excluded. Ectatic right internal carotid artery. Small bulge proximal cavernous segment may be related to atherosclerosis rather than aneurysm. Stability can be confirmed on follow-up. Prominent ectasia right carotid terminus. No definitive aneurysm. Evaluation limited by motion. Mild to moderate narrowing of the M1 segment of the right middle cerebral artery. Mild to moderate narrowing M1 segment left middle cerebral artery. Hypoplastic versus markedly diseased A1 segment of the left anterior cerebral artery.  Middle cerebral artery moderate branch vessel irregularity. Mild narrowing distal right vertebral artery. Mild to moderate  irregularity with areas of mild to moderate narrowing basilar artery.  Nonvisualization right AICA. Fetal type origin of the right posterior cerebral artery. Moderate  to marked narrowing involving portions of the posterior cerebral artery bilaterally.  IMPRESSION: Left internal carotid artery is small in caliber. This may be related to proximal stenosis. Superimposed atherosclerotic type changes (dissection not excluded). Nonvisualization left vertebral artery and left PICA. This may be related to the occlusion of atherosclerosis. Dissection not excluded.  Fuller Canada, M.D.   05/09/11 Carotid Doppler: Preliminary report: Right: 60-79% internal carotid artery stenosis. Left: The internal carotid appears to be fed by a retrograde branch of the external carotid. Bilateral: Vertebral artery flow is antegrade. The left vertebral waveform becomes spiked in the distal portion consistent with a more distal obstruction.  05/08/11 2 D ECHO: Left ventricle moderate LVH. Systolic function of 65% to 70%.  Mid-cavity gradient, peak 33 mmHg.grade 1diastolic dysfunction.  - Aortic valve: Trileaflet; severely calcified leaflets. Mild to moderate aortic stenosis. Mean gradient:5mm Hg(S). Peak gradient: 32mm Hg (S). Valve area:1.36cm^2(VTI). - Mitral valve: Mildly calcified annulus. - Left atrium: The atrium was mildly dilated. - Right ventricle: The cavity size was normal. Systolic function was normal. - Pulmonary arteries: No complete TR doppler jet so unable to estimate PA systolic pressure. - Inferior vena cava: The vessel was normal in size; the respirophasic diameter changes were in the normal range (=50%); findings are consistent with normal central venous pressure. Impressions: Normal LV size with moderate LV hypertrophy. Vigorous systolic function, EF 65-70% with mid cavity gradient to 33 mmHg. The aortic valve was severely calcified with mild to moderate stenosis (mild by mean gradient, moderate visually and  by valve area). Normal RV size and systolic function.   Therapies: rec. inpt rehab.   Assessment: 61 y.o. 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 CTA head neck concerning for proximal right internal carotid artery with irregular plaque and ulceration, 76% diameter stenosis;  left internal carotid artery with proximal focal occlusion; left vertebral is occluded at C1; proximal right vertebral artery with moderate to marked narrowing. Continues to have diploplia, R UE paresis and weakness RLE. 2. Mild - mod AS on 2D Echo 3. necrotic appearing 1.5 x 1.3 x 2.2 cm node suspicious for malignancy posterior to the angle of the left mandible. Primary source is not identified.   4. Ground-glass  opacity right upper lobe.  Follow-up chest CT in 3 months recommended.  5. Diabetes Mellitus- uncontrolled; A1C 10.2 6. HTN- controlled. 7. Hyperlipidemia; not at goal <80; LDL 135 8. Tobacco abuse 9. Depression  Stroke Risk Factors - diabetes mellitus, hyperlipidemia, hypertension and smoking  Plan: 1. 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 No need for any emergent revascularization. Rehab first followed by neck LN biopsy as outpatient. Will address RICA revascularization as outpatient later. D/W patient and daughter and answered questions 3 Primary team to contact ENT re: mandibular necrotic mass   2. Aggressive risk factor reduction, aggressive DM management. 3. Smoking cessation consult. 4. LDL not at goal on home dose pravachol 40, change to lipitor 40. 5.Stroke team will sign off. Call for questions.    LOS: 5 days     Delia Heady, MD 05/13/2011  11:35 AM

## 2011-05-13 NOTE — Progress Notes (Signed)
Rehab admissions - Continuing to follow.  I will need updated PT and OT notes so that I can approach insurance carrier again about possible inpatient rehab admission.  Noted that biopsy is pending today.  Pager [phone]

## 2011-05-13 NOTE — Progress Notes (Signed)
Attempted to see; however, pt. Eating dinner, will re-attempt.  Jeani Hawking, OTR/L [phone]

## 2011-05-13 NOTE — Progress Notes (Signed)
Nutrition Follow-up  Pt states her appetite is slowly improving. Pt states PO intake ~50% of meals and supplements. Per RN, pt given two Glucerna shakes today; pt consumed most of the first but has not opened the second shake. Pt states she has never had a big appetite; she normally eats three small meals a day.   Pt's daughter present at bedside. Education on DM diet recommendations provided to pt and pt's daughter. Pt's daughter agreeable to help her mother with the diet recommendations after discharge. Pt's speech was only slightly slurred when talking to her; she was alert and sitting up in her bed.  Stage 1 sacral pressure ulcer noted.  Diet Order:  Dysphagia 3 thin liquids  Meds: Scheduled Meds:   . aspirin  81 mg Oral Daily  . atorvastatin  40 mg Oral q1800  . ceFAZolin      . citalopram  20 mg Oral Daily  . enoxaparin  40 mg Subcutaneous Q24H  . feeding supplement  237 mL Oral TID WC  . gabapentin  600 mg Oral QHS  . insulin aspart  0-15 Units Subcutaneous TID WC  . insulin glargine  15 Units Subcutaneous QHS  . lisinopril  40 mg Oral Daily  . nicotine  21 mg Transdermal Daily  . rOPINIRole  3 mg Oral QHS  . zolpidem  5 mg Oral QHS   Continuous Infusions:  PRN Meds:.acetaminophen, acetaminophen, iohexol, labetalol, methocarbamol, ondansetron (ZOFRAN) IV, senna-docusate  Labs:  CMP     Component Value Date/Time   NA 129* 05/13/2011 0500   K 3.7 05/13/2011 0500   CL 95* 05/13/2011 0500   CO2 27 05/13/2011 0500   GLUCOSE 139* 05/13/2011 0500   BUN 12 05/13/2011 0500   CREATININE 0.45* 05/13/2011 0500   CALCIUM 9.8 05/13/2011 0500   PROT 6.6 05/12/2011 0101   ALBUMIN 3.2* 05/12/2011 0101   AST 20 05/12/2011 0101   ALT 19 05/12/2011 0101   ALKPHOS 88 05/12/2011 0101   BILITOT 0.6 05/12/2011 0101   GFRNONAA >90 05/13/2011 0500   GFRAA >90 05/13/2011 0500   CBG (last 3)   Basename 05/13/11 1139 05/13/11 0700 05/12/11 2139  GLUCAP 153* 110* 117*   Lab Results  Component Value  Date   HGBA1C 10.2* 05/08/2011   Weight Status:  No new weight  Re-estimated needs:  1700-1900 kcals and 80-90 grams protein  Nutrition Dx:  Inadequate oral intake, ongoing. New Nutrition Dx: Food and nutrition-related knowledge deficit r/t lack of prior nutrition-related knowledge AEB pt statement, Hemoglobin A1C of 10.2.  Goal:    Meet >90% of estimated nutrition needs to optimize nutritional status, not met.  Pt able to verbalize importance of not skipping meals and including protein at each meal; met.   Intervention:    Change Glucerna Shake PO to BID  DM education materials discussed with pt and pt's daughter  Recommend re-weigh pt  Monitor:  PO intake, labs, weight trend, wound care  Karenann Cai Pager #:  [phone]  Kendell Bane Cornelison [phone]

## 2011-05-13 NOTE — Progress Notes (Signed)
Physical Therapy Treatment Patient Details Name: Chelsey Gallagher MRN: [id number] DOB: [birth year] Today's Date: 05/13/2011  PT Assessment/Plan  PT - Assessment/Plan Comments on Treatment Session: Pt continues to make great improvements and has started taking a few steps. Pt continues to battle Rt. LE tone and spasticity. With stepping she adducts Rt. LE and significantly supinates Rt. ankle. Although able to avoid inversion sprain currently  if pt does not improve drastically she may be a good candidate for an orthotic device in the future.  PT Plan: Discharge plan remains appropriate;Frequency remains appropriate PT Frequency: Min 4X/week Recommendations for Other Services: Rehab consult Follow Up Recommendations: Inpatient Rehab;Supervision/Assistance - 24 hour Equipment Recommended: Defer to next venue PT Goals  Acute Rehab PT Goals PT Goal Formulation: With patient/family Pt will go Supine/Side to Sit: with modified independence PT Goal: Supine/Side to Sit - Progress: Progressing toward goal Pt will Sit at [facility] of Bed: with modified independence PT Goal: Sit at [facility] Of Bed - Progress: Progressing toward goal Pt will go Sit to Stand: with min assist;with upper extremity assist PT Goal: Sit to Stand - Progress: Progressing toward goal Pt will go Stand to Sit: with min assist PT Goal: Stand to Sit - Progress: Progressing toward goal Pt will Stand: with supervision;with bilateral upper extremity support;3 - 5 min PT Goal: Stand - Progress: Progressing toward goal Pt will Ambulate: 16 - 50 feet;with least restrictive assistive device PT Goal: Ambulate - Progress: Progressing toward goal Pt will Perform Home Exercise Program: Independently PT Goal: Perform Home Exercise Program - Progress: Progressing toward goal  PT Treatment Precautions/Restrictions  Precautions Precautions: Fall Required Braces or Orthoses: No Restrictions Weight Bearing Restrictions: No Mobility (including  Balance) Bed Mobility Bed Mobility: Yes Rolling Left: 5: Supervision;With rail Rolling Left Details (indicate cue type and reason): VC for hand placement and sequencing. No physical assist needed  Left Sidelying to Sit: With rails;4: Min assist;HOB flat Left Sidelying to Sit Details (indicate cue type and reason): Assist through hand pull up per pt request. Encouragement to maximize independence. Sitting - Scoot to Edge of Bed: 5: Supervision Sitting - Scoot to Edge of Bed Details (indicate cue type and reason): VC for weight shifting. Pt required increased time for initiation and for motor planning. Cues for lateral weightshifts to unload contralateral hip. Transfers Transfers: Yes Sit to Stand: 1: +2 Total assist;Patient percentage (comment);From bed;From chair/3-in-1;With armrests;With upper extremity assist (70%) Sit to Stand Details (indicate cue type and reason): assist to block Rt knee to prevent buckling. Facilitation through pelvis for control of midline positioning. support again given to RUE to prevent hanging at side and help prevent subluxation Stand to Sit: 3: Mod assist;To chair/3-in-1;With armrests;With upper extremity assist Stand Pivot Transfers: 1: +2 Total assist;Patient percentage (comment) (65%) Stand Pivot Transfer Details (indicate cue type and reason): Transferred from bed to 3n1. Facilitation of  Rt. quad to promote modulation of knee control to recurvatuum and excessive flexion. Manual cues given throughout bil. posterior hips to facilitate anterior pelvic tilt and midline orientation for safety during transfer. Pt able to flex R hip with cueing and minimal tactile cues Ambulation/Gait Ambulation/Gait: Yes Ambulation/Gait Assistance: 1: +2 Total assist;Patient percentage (comment) Ambulation/Gait Assistance Details (indicate cue type and reason): pt = 50%; Tactile cues given throughout bil. posterior hips to facilitate anterior pelvic tilt and midline orientation for  safety during ambulation. PT to initiate first step with Rt. LE and assist with foot clearance, pt able to initiate the rest. Facilitation of  bilateral weightshifting with stepping. Facilitation through Rt. quads for grading knee flexion/extension during Rt. LE stance. Pt also has tendency to adduct Rt. LE and supinate Rt. foot placing her at high risk for inversion sprain (family, pt, and nursing educated on this). Ambulation Distance (Feet): 4 Feet (Approximately 4 steps bilaterally (8 total)) Assistive device: 1 person hand held assist Gait Pattern: Step-to pattern;Shuffle;Decreased hip/knee flexion - right;Decreased stance time - right;Decreased weight shift to right;Decreased weight shift to left;Trunk flexed;Scissoring;Right genu recurvatum Stairs: No  Static Standing Balance Static Standing - Balance Support: Bilateral upper extremity supported (decreased RUE, tactile and manual support to maintain WB) Static Standing - Level of Assistance: 1: +2 Total assist;Patient percentage (comment) (pt= 50%) Static Standing - Comment/# of Minutes: Manual facilitation at trunk as well as at R knee to prevent buckling and recurvatuum. Cues to prevent right lateral lean Exercise  General Exercises - Lower Extremity Ankle Circles/Pumps: AROM;AAROM;Both;15 reps;Seated (educated ongetting straight plantarflexion without supinatio) Other Exercises Other Exercises: dorsiflexor strengthening/stretching with sheet to minimize Plantar Flexion tone. Pt educated on not allowing foot to invert/supinate secondary to risk of ligamentous damage. Pt demonstrating appropriate technique x 8 reps. Family also educated.  End of Session PT - End of Session Equipment Utilized During Treatment: Gait belt Activity Tolerance: Patient tolerated treatment well Patient left: in chair;with call bell in reach;with family/visitor present Nurse Communication: Mobility status for transfers General Behavior During Session: [facility] for  tasks performed Cognition: Impaired Cognitive Impairment: slightly impulsive and decreased safety awareness, continues to improve  Wilhemina Bonito 05/13/2011, 6:07 PM  Sherie Don) Carleene Mains PT, DPT Acute Rehabilitation [phone]

## 2011-05-13 NOTE — Plan of Care (Signed)
Problem: Food- and Nutrition-Related Knowledge Deficit (NB-1.1) Goal: Nutrition education Formal process to instruct or train a patient/client in a skill or to impart knowledge to help patients/clients voluntarily manage or modify food choices and eating behavior to maintain or improve health.  Outcome: Completed/Met Date Met:  05/13/11 Pt's daughter was present at bedside with pt during education. Pt provided with Carbohydrate Counting for People with Diabetes handout. Topics included identifying CHO containing foods, CHO counting, and the importance of not skipping meals was emphasized. Handout provided from the [employer]. Questions Answered. Pt states she will eat breakfast every morning, not skip meals throughout the day and will include protein at every meal. Pt's daughter agreed to help her stick to the diet recommendations. Expect good compliance with diet recommendations.  Karenann Cai Pager #: [phone]  Kendell Bane Cornelison [phone]

## 2011-05-13 NOTE — Progress Notes (Cosign Needed)
Patient ID: Chelsey Gallagher, female   DOB: Aug 14, 1950, 61 y.o.   MRN: [id number] Patient tentatively scheduled for US guided left neck lymph node biopsy on 3/13. Hx noted, labs checked, and imaging studies reviewed by Dr. Lowella Dandy. Details/risks of above d/w patient /daughter with their apparent understanding and consent.

## 2011-05-13 NOTE — Discharge Summary (Signed)
Patient ID: Chelsey Gallagher MRN: [id number] DOB/AGE: 60/26/1952 60 y.o.  Admit date: 05/08/2011 Discharge date: 05/13/2011  Primary Care Physician:  Betsey Holiday, MD, MD  Discharge Diagnoses:    Present on Admission:  .CVA (cerebral infarction) .Hypertension .Diabetes mellitus .Mandibular mass .Lung abnormality  Principal Problem:  *CVA (cerebral infarction) Active Problems:  Hypertension  Diabetes mellitus  Mandibular mass  Lung abnormality   Medication List  As of 05/13/2011  4:20 PM   STOP taking these medications         glyBURIDE micronized 3 MG tablet      lisinopril 20 MG tablet      pravastatin 40 MG tablet         TAKE these medications         citalopram 20 MG tablet   Commonly known as: CELEXA   Take 20 mg by mouth daily.      dicyclomine 10 MG capsule   Commonly known as: BENTYL   Take 10 mg by mouth 4 (four) times daily -  before meals and at bedtime.      gabapentin 600 MG tablet   Commonly known as: NEURONTIN   Take 300-600 mg by mouth 2 (two) times daily. 300mg  at supper and 600mg  at bedtime      HYDROcodone-acetaminophen 7.5-325 MG per tablet   Commonly known as: NORCO   Take 0.5-1 tablets by mouth every 4 (four) hours as needed. For pain      metFORMIN 500 MG tablet   Commonly known as: GLUCOPHAGE   Take 1,000-1,500 mg by mouth 2 (two) times daily with a meal. 1500 mg every morning and 1000 mg every evening      rOPINIRole 3 MG tablet   Commonly known as: REQUIP   Take 3 mg by mouth at bedtime.            New medications added during the hospitalization: Lisinopril 40 mg PO daily Lipitor 40 mg PO daily Lantus 15 units Q HS Aspirin 81 mg PO daily Nicotine patch 21 mg QD   Disposition and Follow-up: Pt will need to follow up with PCP. In addition please note that pt will need follow up CT chest in 3 months (aroundJune) for evaluation of the right upper lung lobe nodule. Pt also had suspicious mandibular malignancy and has  undergone US guided biopsy and will have to follow up on recommendations and pathology results. Pt will have to follow up with [name] in ~ 2  Months to evaluate for need for stent placement for occluded left vertebral artery.   Consults:  Neurology - stroke, IR - cerebral angiogram, ENT, Dr. Jenne Pane - mandibular mass  Significant Diagnostic Studies:   Dg Chest 2 View 05/08/2011 IMPRESSION: No evidence of acute cardiopulmonary disease.    Ct Head Wo Contrast 05/08/2011  IMPRESSION: No acute intracranial abnormalities.    Mr Brain Wo Contrast 05/08/2011  IMPRESSION: Acute non hemorrhagic left paracentral medulla infarct.  Tiny acute non hemorrhagic posterior left cerebellar infarct.   MRA HEAD   IMPRESSION: Left internal carotid artery is small in caliber.  This may be related to proximal stenosis.  Superimposed atherosclerotic type changes (dissection not excluded).  Nonvisualization left vertebral artery and left PICA.  This may be related to the occlusion of atherosclerosis.  Dissection not excluded.     Brief H and P: 61 yo F with h/o HTN, diabetes, current tobacco abuse, restless legs syndrome, presented to [facility] ED with main concern of generalized weakness,  mostly on right side (arm and leg) which woke her up at night. In addition, her daughter noticed slurred speech and facial droop. Code stroke initiated in ED and upon her arrival BP found to be 207/107 mmHg and was determined not to be tPA candidate due to time delay. MRI showed acute non hemorrhagic left paracentral medulla infarct and ny acute non hemorrhagic posterior left cerebellar infarct. Stroke service has been involved in pt's care and further work up included:  CT Angio head and neck: Posterior to the angle of the left mandible necrotic appearing 1.5 x 1.3 x 2.2 cm node suspicious for malignancy. Primary source is not identified. ENT consultation with direct   Ground-glass opacity right upper lobe. Follow-up chest CT in 3 months  recommended.  Proximal right internal carotid artery with irregular plaque and ulceration. 76% diameter stenosis.  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   IR CEREBRAL ANGIOGRAM done 05/12/2011 Transfer to inpatient rehab delayed for additional work up. Please note that necrotic mandibular nodule followed by Dr. Jenne Pane, biopsy scheduled for 05/13/2011. Pulmonary nodule to be evaluated by follow up CT in 3 months.  Physical Exam on Discharge:  Filed Vitals:   05/13/11 0547 05/13/11 0602 05/13/11 1000 05/13/11 1300  BP: 136/72  175/78 145/79  Pulse: 78  77 75  Temp: 98.3 F (36.8 C)  98.3 F (36.8 C) 98.6 F (37 C)  TempSrc: Oral  Oral Oral  Resp: 20  17 17   Height:      Weight:      SpO2: 87% 94% 90% 90%    No intake or output data in the 24 hours ending 05/13/11 1620  General: Alert, awake, oriented x3, in no acute distress. HEENT: No bruits, no goiter. Heart: Regular rate and rhythm, without murmurs, rubs, gallops. Lungs: Clear to auscultation bilaterally. Abdomen: Soft, nontender, nondistended, positive bowel sounds. Extremities: No clubbing cyanosis or edema with positive pedal pulses. Neuro: Not moving right upper extremities, moving left side very well upper and lower extremities, speech still slurred with facial droop  CBC:    Component Value Date/Time   WBC 7.3 05/13/2011 0500   HGB 16.0* 05/13/2011 0500   HCT 45.5 05/13/2011 0500   PLT 154 05/13/2011 0500   MCV 90.5 05/13/2011 0500   NEUTROABS 5.3 05/08/2011 0219   LYMPHSABS 1.2 05/08/2011 0219   MONOABS 0.4 05/08/2011 0219   EOSABS 0.0 05/08/2011 0219   BASOSABS 0.0 05/08/2011 0219    Basic Metabolic Panel:    Component Value Date/Time   NA 129* 05/13/2011 0500   K 3.7 05/13/2011 0500   CL 95* 05/13/2011 0500   CO2 27 05/13/2011 0500   BUN 12 05/13/2011 0500   CREATININE 0.45* 05/13/2011 0500   GLUCOSE 139* 05/13/2011 0500   CALCIUM 9.8 05/13/2011 0500    Hospital Course:   Principal  Problem:  *CVA (cerebral infarction)  - please note the above MRI findings  - pt evaluated and managed by stroke service  - pt was immediately started on BP medication, cholesterol and diabetic medications  - education was provided on risk factor control and PT will continue in an inpatient setting  - cerebral angiogram done - pt has left vertebral artery occlusion but per [name] stenting can be done in an outpatient settinh - pt cleared for IR placement  Active Problems:  Hypertension  - please note that the dose of Lisinopril was increased to 40 mg PO QD and pt has responded well  -  BP remains stable during the hospitalization   Diabetes mellitus  - uncontrolled with A1C > 10  - lantus was started, 15 units Q HS along with continuation of home medication Metformin  - Glyburide dicontinued   Mandibular mass  - noted on CTA of the neck  - I have spoke with Dr. Jenne Pane, ENT on call, no emergency need for work up needed at this time  - ;pt will have US guided biopsy tomorrow afternoon 05/14/2011 at 3 pm  Lung abnormality  - noted on CTA  - recommendation was to have follow up CT in 3 months  - CT chest/ abdomen and pelvis done and unremarkable except the same lung nodule noted  EDUCATION  - test results and diagnostic studies were discussed with patient and pt's family who was present at the bedside  - patient and family have verbalized the understanding  - questions were answered at the bedside and contact information was provided for additional questions or concerns     Time spent on Discharge: Over 30 minutes  Signed: Debbora Presto 05/13/2011, 4:20 PM  [facility], pager #: [phone] Main office number: [phone]

## 2011-05-13 NOTE — Progress Notes (Signed)
Speech Language/Pathology Speech Pathology: Dysphagia Treatment Note  Patient was observed with : Mechanical Soft / Ground and Thin liquids.  Patient was noted to have s/s of aspiration : No:    Lung Sounds:  Expiratory wheeze, clear Temperature: afebrile  Patient required: Min verbal cues for small sips, avoidance of straws. Pt reports some coughing with sausage, says it felt like it fell back to her throat. Pt masticated cracker slowly , removed residue independently.  SLP provided model for slow, loud overarticulated speech with short phrases. Pt implemented at conversation level with moderate verbal cues.    Clinical Impression: Pt continues with mild dysarthria, evidence of velopharyngeal incompetence with multisyllabic words and connected speech. Pt tolerating diet, needed reinforcement for aspiration precautions and speech strategies.   Recommendations:  Continue diet, SLP will continue to follow  Pain:   none Intervention Required:   No   Goals: Goals Partially Met Harlon Ditty, MA CCC-SLP [phone]

## 2011-05-14 ENCOUNTER — Inpatient Hospital Stay (HOSPITAL_COMMUNITY)
Admission: RE | Admit: 2011-05-14 | Discharge: 2011-06-04 | DRG: 945 | Disposition: A | Discharge status: Home Health | Payer: Medicare Other | Source: Ambulatory Visit | Attending: Physical Medicine & Rehabilitation | Admitting: Physical Medicine & Rehabilitation

## 2011-05-14 ENCOUNTER — Encounter (HOSPITAL_COMMUNITY): Payer: Self-pay | Admitting: Internal Medicine

## 2011-05-14 ENCOUNTER — Inpatient Hospital Stay (HOSPITAL_COMMUNITY): Payer: Medicare Other

## 2011-05-14 DIAGNOSIS — F329 Major depressive disorder, single episode, unspecified: Secondary | ICD-10-CM | POA: Diagnosis present

## 2011-05-14 DIAGNOSIS — I10 Essential (primary) hypertension: Secondary | ICD-10-CM | POA: Diagnosis present

## 2011-05-14 DIAGNOSIS — B370 Candidal stomatitis: Secondary | ICD-10-CM | POA: Diagnosis not present

## 2011-05-14 DIAGNOSIS — E119 Type 2 diabetes mellitus without complications: Secondary | ICD-10-CM | POA: Diagnosis present

## 2011-05-14 DIAGNOSIS — R350 Frequency of micturition: Secondary | ICD-10-CM | POA: Diagnosis not present

## 2011-05-14 DIAGNOSIS — I639 Cerebral infarction, unspecified: Secondary | ICD-10-CM

## 2011-05-14 DIAGNOSIS — Z5189 Encounter for other specified aftercare: Principal | ICD-10-CM

## 2011-05-14 DIAGNOSIS — F17200 Nicotine dependence, unspecified, uncomplicated: Secondary | ICD-10-CM | POA: Diagnosis present

## 2011-05-14 DIAGNOSIS — I6330 Cerebral infarction due to thrombosis of unspecified cerebral artery: Secondary | ICD-10-CM

## 2011-05-14 DIAGNOSIS — G4700 Insomnia, unspecified: Secondary | ICD-10-CM | POA: Diagnosis not present

## 2011-05-14 DIAGNOSIS — G2581 Restless legs syndrome: Secondary | ICD-10-CM | POA: Diagnosis present

## 2011-05-14 DIAGNOSIS — R220 Localized swelling, mass and lump, head: Secondary | ICD-10-CM | POA: Diagnosis present

## 2011-05-14 DIAGNOSIS — F3289 Other specified depressive episodes: Secondary | ICD-10-CM | POA: Diagnosis present

## 2011-05-14 DIAGNOSIS — I6350 Cerebral infarction due to unspecified occlusion or stenosis of unspecified cerebral artery: Secondary | ICD-10-CM | POA: Diagnosis present

## 2011-05-14 DIAGNOSIS — E785 Hyperlipidemia, unspecified: Secondary | ICD-10-CM

## 2011-05-14 DIAGNOSIS — Z720 Tobacco use: Secondary | ICD-10-CM | POA: Diagnosis present

## 2011-05-14 HISTORY — DX: Hyperlipidemia, unspecified: E78.5

## 2011-05-14 LAB — GLUCOSE, CAPILLARY
Glucose-Capillary: 136 mg/dL — ABNORMAL HIGH (ref 70–99)
Glucose-Capillary: 160 mg/dL — ABNORMAL HIGH (ref 70–99)

## 2011-05-14 LAB — BASIC METABOLIC PANEL
CO2: 29 mEq/L (ref 19–32)
Calcium: 10.1 mg/dL (ref 8.4–10.5)
GFR calc Af Amer: >90 mL/min (ref >90)
GFR calc non Af Amer: >90 mL/min (ref >90)
Sodium: 131 mEq/L — ABNORMAL LOW (ref 135–145)

## 2011-05-14 MED ORDER — ROPINIROLE HCL 1 MG PO TABS
3.0000 mg | ORAL_TABLET | Freq: Every day | ORAL | Status: DC
  Administered 2011-05-14 – 2011-05-18 (×5): 3 mg via ORAL
  Filled 2011-05-14 (×6): qty 3

## 2011-05-14 MED ORDER — SORBITOL 70 % SOLN
30.0000 mL | Freq: Every day | Status: DC | PRN
  Administered 2011-05-28: 30 mL via ORAL
  Filled 2011-05-14: qty 30

## 2011-05-14 MED ORDER — CITALOPRAM HYDROBROMIDE 20 MG PO TABS
20.0000 mg | ORAL_TABLET | Freq: Every day | ORAL | Status: DC
  Filled 2011-05-14 (×2): qty 1

## 2011-05-14 MED ORDER — INSULIN GLARGINE 100 UNIT/ML ~~LOC~~ SOLN
15.0000 [IU] | Freq: Every day | SUBCUTANEOUS | Status: DC
  Administered 2011-05-14 – 2011-06-03 (×21): 15 [IU] via SUBCUTANEOUS

## 2011-05-14 MED ORDER — TRAZODONE HCL 50 MG PO TABS
25.0000 mg | ORAL_TABLET | Freq: Every evening | ORAL | Status: DC | PRN
  Administered 2011-05-14 – 2011-05-15 (×2): 25 mg via ORAL
  Administered 2011-05-16 – 2011-05-20 (×5): 50 mg via ORAL
  Administered 2011-05-22: 25 mg via ORAL
  Administered 2011-05-24 – 2011-06-03 (×8): 50 mg via ORAL
  Filled 2011-05-14 (×16): qty 1

## 2011-05-14 MED ORDER — INSULIN ASPART 100 UNIT/ML ~~LOC~~ SOLN
0.0000 [IU] | Freq: Three times a day (TID) | SUBCUTANEOUS | Status: DC
  Administered 2011-05-14 – 2011-05-15 (×4): 3 [IU] via SUBCUTANEOUS
  Administered 2011-05-16 (×3): 2 [IU] via SUBCUTANEOUS
  Administered 2011-05-17 – 2011-05-19 (×4): 3 [IU] via SUBCUTANEOUS
  Administered 2011-05-19 (×2): 2 [IU] via SUBCUTANEOUS
  Administered 2011-05-20 – 2011-05-21 (×6): 3 [IU] via SUBCUTANEOUS
  Administered 2011-05-22: 2 [IU] via SUBCUTANEOUS
  Administered 2011-05-22: 3 [IU] via SUBCUTANEOUS
  Administered 2011-05-22 – 2011-05-23 (×2): 5 [IU] via SUBCUTANEOUS
  Administered 2011-05-23 – 2011-05-24 (×3): 3 [IU] via SUBCUTANEOUS
  Administered 2011-05-24: 2 [IU] via SUBCUTANEOUS
  Administered 2011-05-24 – 2011-05-25 (×2): 3 [IU] via SUBCUTANEOUS
  Administered 2011-05-25 – 2011-05-26 (×4): 2 [IU] via SUBCUTANEOUS
  Administered 2011-05-26: 3 [IU] via SUBCUTANEOUS
  Administered 2011-05-27 (×2): 2 [IU] via SUBCUTANEOUS
  Administered 2011-05-27: 3 [IU] via SUBCUTANEOUS
  Administered 2011-05-28 (×2): 2 [IU] via SUBCUTANEOUS
  Administered 2011-05-29: 3 [IU] via SUBCUTANEOUS
  Administered 2011-05-30 – 2011-06-02 (×5): 2 [IU] via SUBCUTANEOUS
  Administered 2011-06-02: 3 [IU] via SUBCUTANEOUS
  Administered 2011-06-02: 2 [IU] via SUBCUTANEOUS
  Administered 2011-06-03: 3 [IU] via SUBCUTANEOUS

## 2011-05-14 MED ORDER — POLYETHYLENE GLYCOL 3350 17 G PO PACK
17.0000 g | PACK | Freq: Every day | ORAL | Status: DC | PRN
  Filled 2011-05-14: qty 1

## 2011-05-14 MED ORDER — LISINOPRIL 40 MG PO TABS
40.0000 mg | ORAL_TABLET | Freq: Every day | ORAL | Status: DC
Start: 2011-05-14 — End: 2011-05-14
  Filled 2011-05-14 (×2): qty 1

## 2011-05-14 MED ORDER — ACETAMINOPHEN 325 MG PO TABS: 325.0000 mg | ORAL_TABLET | ORAL | Status: DC | PRN

## 2011-05-14 MED ORDER — ATORVASTATIN CALCIUM 40 MG PO TABS
40.0000 mg | ORAL_TABLET | Freq: Every day | ORAL | Status: DC
  Administered 2011-05-14 – 2011-06-03 (×21): 40 mg via ORAL
  Filled 2011-05-14 (×22): qty 1

## 2011-05-14 MED ORDER — SORBITOL 70 % SOLN
30.0000 mL | Status: AC
  Administered 2011-05-14: 30 mL via ORAL
  Filled 2011-05-14: qty 30

## 2011-05-14 MED ORDER — LISINOPRIL 40 MG PO TABS
40.0000 mg | ORAL_TABLET | Freq: Every day | ORAL | Status: DC
  Administered 2011-05-15 – 2011-05-26 (×12): 40 mg via ORAL
  Filled 2011-05-14 (×14): qty 1

## 2011-05-14 MED ORDER — METHOCARBAMOL 500 MG PO TABS
500.0000 mg | ORAL_TABLET | Freq: Three times a day (TID) | ORAL | Status: DC | PRN
  Administered 2011-05-16 – 2011-05-31 (×11): 500 mg via ORAL
  Filled 2011-05-14 (×11): qty 1

## 2011-05-14 MED ORDER — ACETAMINOPHEN 325 MG PO TABS
325.0000 mg | ORAL_TABLET | ORAL | Status: DC | PRN
  Administered 2011-05-14 – 2011-05-31 (×12): 650 mg via ORAL
  Filled 2011-05-14 (×7): qty 2
  Filled 2011-05-14: qty 1
  Filled 2011-05-14 (×5): qty 2

## 2011-05-14 MED ORDER — ASPIRIN 81 MG PO CHEW: 81.0000 mg | CHEWABLE_TABLET | Freq: Every day | ORAL | Status: DC

## 2011-05-14 MED ORDER — MIDAZOLAM HCL 2 MG/2ML IJ SOLN
INTRAMUSCULAR | Status: AC
  Filled 2011-05-14: qty 4

## 2011-05-14 MED ORDER — ENOXAPARIN SODIUM 40 MG/0.4ML ~~LOC~~ SOLN
40.0000 mg | SUBCUTANEOUS | Status: DC
  Administered 2011-05-15 – 2011-06-03 (×20): 40 mg via SUBCUTANEOUS
  Filled 2011-05-14 (×21): qty 0.4

## 2011-05-14 MED ORDER — SODIUM CHLORIDE 0.9 % IV SOLN: INTRAVENOUS | Status: AC

## 2011-05-14 MED ORDER — CITALOPRAM HYDROBROMIDE 20 MG PO TABS
20.0000 mg | ORAL_TABLET | Freq: Every day | ORAL | Status: DC
  Administered 2011-05-15 – 2011-06-04 (×21): 20 mg via ORAL
  Filled 2011-05-14 (×22): qty 1

## 2011-05-14 MED ORDER — SENNA 8.6 MG PO TABS: 1.0000 | ORAL_TABLET | Freq: Two times a day (BID) | ORAL | Status: DC

## 2011-05-14 MED ORDER — FENTANYL CITRATE 0.05 MG/ML IJ SOLN
INTRAMUSCULAR | Status: AC
  Filled 2011-05-14: qty 4

## 2011-05-14 MED ORDER — POLYETHYLENE GLYCOL 3350 17 G PO PACK: 17.0000 g | PACK | Freq: Every day | ORAL | Status: DC | PRN

## 2011-05-14 MED ORDER — BISACODYL 10 MG RE SUPP
10.0000 mg | Freq: Every day | RECTAL | Status: DC | PRN
  Administered 2011-05-15: 10 mg via RECTAL
  Filled 2011-05-14 (×2): qty 1

## 2011-05-14 MED ORDER — GABAPENTIN 600 MG PO TABS
600.0000 mg | ORAL_TABLET | Freq: Every day | ORAL | Status: DC
  Administered 2011-05-14 – 2011-05-16 (×3): 600 mg via ORAL
  Filled 2011-05-14 (×4): qty 1

## 2011-05-14 MED ORDER — ASPIRIN 81 MG PO CHEW
81.0000 mg | CHEWABLE_TABLET | Freq: Every day | ORAL | Status: DC
  Administered 2011-05-15 – 2011-06-04 (×21): 81 mg via ORAL
  Filled 2011-05-14 (×20): qty 1

## 2011-05-14 MED ORDER — SENNA 8.6 MG PO TABS
1.0000 | ORAL_TABLET | Freq: Two times a day (BID) | ORAL | Status: DC
  Administered 2011-05-14 – 2011-06-04 (×33): 8.6 mg via ORAL
  Filled 2011-05-14 (×43): qty 1

## 2011-05-14 MED ORDER — NICOTINE 21 MG/24HR TD PT24
21.0000 mg | MEDICATED_PATCH | Freq: Every day | TRANSDERMAL | Status: DC
  Administered 2011-05-15 – 2011-06-04 (×21): 21 mg via TRANSDERMAL
  Filled 2011-05-14 (×22): qty 1

## 2011-05-14 MED ORDER — ONDANSETRON HCL 4 MG/2ML IJ SOLN: 4.0000 mg | Freq: Four times a day (QID) | INTRAMUSCULAR | Status: DC | PRN

## 2011-05-14 MED ORDER — ENOXAPARIN SODIUM 40 MG/0.4ML ~~LOC~~ SOLN
40.0000 mg | SUBCUTANEOUS | Status: DC
  Filled 2011-05-14: qty 0.4

## 2011-05-14 NOTE — Plan of Care (Addendum)
Overall Plan of Care [facility]) Patient Details Name: Chelsey Gallagher MRN: [id number] DOB: Nov 23, 1950  Diagnosis:  Rehabilitation for CVA  Primary Diagnosis:    L medullary and L cerebellar infarct  Co-morbidities: L mandibular mass, HTN  Functional Problem List  Patient demonstrates impairments in the following areas: Balance, Bladder, Bowel, Medication Management, Motor, Sensory  and Vision  Basic ADL's: eating, grooming, bathing, dressing and toileting Advanced ADL's: simple meal preparation and laundry  Transfers:  bed mobility, bed to chair, toilet, tub/shower, car and furniture Locomotion:  ambulation, wheelchair mobility and stairs  Additional Impairments:  Functional use of upper extremity and Swallowing  Anticipated Outcomes Item Anticipated Outcome  Eating/Swallowing  Mod I  Basic self-care  Min assist transfers and LB, supervision grooming and UB  Tolieting  Min assist  Bowel/Bladder  Mod independent, continent bowel/bladder  Transfers  Supervision basic and car  Locomotion  Supervision w/c x 150' in controlled environment, 53' in home environment; Mod A ambulation x 25' in controlled environment  Communication  Mod I-supervision  Cognition    Pain  < 3 on 1-10 scale  Safety/Judgment  supervision  Other  Skin: min assist with turning/repositioning; no new breakdown   Therapy Plan: PT Frequency: 1-2 X/day, 60-90 minutes OT Frequency: 1-2 X/day, 60-90 minutes SLP Frequency: 1-2 X/day, 30-60 minutes;5 out of 7 days   Team Interventions: Item RN PT OT SLP SW TR Other  Self Care/Advanced ADL Retraining  x x      Neuromuscular Re-Education  x x      Therapeutic Activities  x x x     UE/LE Strength Training/ROM  x x      UE/LE Coordination Activities  x x      Visual/Perceptual Remediation/Compensation  x x      DME/Adaptive Equipment Instruction  x x      Therapeutic Exercise  x x      Balance/Vestibular Training  x x      Patient/Family Education  x  x x     Cognitive Remediation/Compensation  x x      Functional Mobility Training  x x      Ambulation/Gait Training  x       Stair Training  x       Wheelchair Propulsion/Positioning  x       Functional Statistician   x      Community Reintegration  x x      Dysphagia/Aspiration Printmaker    x     Speech/Language Facilitation    x     Bladder Management x        Bowel Management x        Disease Management/Prevention x        Pain Management x        Medication Management x        Skin Care/Wound Management x        Splinting/Orthotics  x       Discharge Planning  x x  x    Psychosocial Support  x x  x                       Team Discharge Planning: Destination:  Home Projected Follow-up:  PT, OT and SLP Home Health Projected Equipment Needs:  Bedside Commode, Information systems manager and Wheelchair Patient/family involved in discharge planning:  Yes  MD ELOS: 2 wks Medical Rehab Prognosis:  Good Assessment: 61 yo female admitted for  R HP now requiring CIR level PT,OT,SLP, 24/7 rehab RN and MD.

## 2011-05-14 NOTE — Progress Notes (Signed)
Subjective: Patient still with dysarthria. Patient expressing frustration of not being in control. No complaints.  Objective: Vital signs in last 24 hours: Filed Vitals:   05/14/11 0200 05/14/11 0519 05/14/11 0900 05/14/11 1110  BP: 164/80 154/72 149/80 141/76  Pulse: 99 93 82 82  Temp: 97.8 F (36.6 C) 98.4 F (36.9 C) 98.7 F (37.1 C)   TempSrc: Oral Oral Oral   Resp: 22 22 18 18   Height:      Weight:      SpO2: 95% 94% 96% 94%   No intake or output data in the 24 hours ending 05/14/11 1405  Weight change:   General: Alert, awake, oriented x3, in no acute distress. Heart: Regular rate and rhythm, without murmurs, rubs, gallops. Lungs: Clear to auscultation bilaterally. Abdomen: Soft, nontender, nondistended, positive bowel sounds. Extremities: No clubbing cyanosis or edema with positive pedal pulses.    Lab Results:  Basename 05/13/11 0500 05/12/11 0101  NA 129* 132*  K 3.7 3.6  CL 95* 97  CO2 27 26  GLUCOSE 139* 147*  BUN 12 12  CREATININE 0.45* 0.45*  CALCIUM 9.8 9.5  MG -- --  PHOS -- --    Basename 05/12/11 0101  AST 20  ALT 19  ALKPHOS 88  BILITOT 0.6  PROT 6.6  ALBUMIN 3.2*   No results found for this basename: LIPASE:2,AMYLASE:2 in the last 72 hours  Basename 05/13/11 0500 05/12/11 0101  WBC 7.3 6.6  NEUTROABS -- --  HGB 16.0* 16.1*  HCT 45.5 47.1*  MCV 90.5 90.8  PLT 154 138*   No results found for this basename: CKTOTAL:3,CKMB:3,CKMBINDEX:3,TROPONINI:3 in the last 72 hours No components found with this basename: POCBNP:3 No results found for this basename: DDIMER:2 in the last 72 hours No results found for this basename: HGBA1C:2 in the last 72 hours No results found for this basename: CHOL:2,HDL:2,LDLCALC:2,TRIG:2,CHOLHDL:2,LDLDIRECT:2 in the last 72 hours No results found for this basename: TSH,T4TOTAL,FREET3,T3FREE,THYROIDAB in the last 72 hours No results found for this basename:  VITAMINB12:2,FOLATE:2,FERRITIN:2,TIBC:2,IRON:2,RETICCTPCT:2 in the last 72 hours  Micro Results: Recent Results (from the past 240 hour(s))  MRSA PCR SCREENING     Status: Normal   Collection Time   05/12/11  8:27 AM      Component Value Range Status Comment   MRSA by PCR NEGATIVE  NEGATIVE  Final     Studies/Results: Ct Chest W Contrast  05/13/2011  *RADIOLOGY REPORT*  Clinical Data: Pulmonary lesion noted on neck CTA  CT CHEST WITH CONTRAST,CT ABDOMEN AND PELVIS WITH CONTRAST  Technique:  Multidetector CT imaging of the chest was performed following the standard protocol during bolus administration of intravenous contrast.,Technique:  Multidetector CT imaging of the abdomen and pelvis was performed following the standard protoc  Contrast: 80mL OMNIPAQUE IOHEXOL 300 MG/ML IJ SOLN  Comparison: 05/09/2011 neck CT A  Findings:  Chest:  Ground-glass opacity within the right upper lobe anteriorly is nonspecific however appears less dense than on the recent neck CT. 3 mm right middle lobe nodule on image 32 of series 3.  No pneumothorax.  No pleural effusion.  Central airways are patent.  Scattered atherosclerotic calcification of the aorta and branch vessels.  Occluded left vertebral artery.  No aneurysmal dilatation or dissection.  Aortic valve calcification.  Coronary artery calcification.  Extensive mural plaque within the descending thoracic aorta.  No intrathoracic lymphadenopathy.  Abdomen pelvis: Mild lobular liver contour.  Status post cholecystectomy.  No biliary ductal dilatation.  Unremarkable spleen, pancreas, right adrenal gland.  There is adreniform enlargement/thickening of the left adrenal gland.  There are several too small to further characterize hypodensities within each kidney.  There is a nonobstructing stone within the lower pole of the left kidney.  No hydronephrosis or hydroureter.  No bowel obstruction.  No CT evidence for colitis.  Surgical clips right lower quadrant.  Anterior  abdominal wall laxity, containing a segment of transverse colon.  No free intraperitoneal air or fluid.  Infrarenal aortobi-iliac graft, patent.  Streak artifact from the patient's hip arthroplasty limits evaluation of the pelvis.  Bladder appears thin-walled.  Absent uterus.  No adnexal mass.  Osteopenia. Multilevel degenerative changes of the imaged spine. No acute or aggressive appearing osseous lesion.  IMPRESSION:  Right upper lobe ground-glass opacity has a different configuration from recent neck CT, therefore not favored to be a true nodule. However, a 7-month follow-up as previously recommended is reasonable to document resolution.  No acute process within the chest, abdomen, pelvis identified.  Bilateral renal hypodensities are incompletely characterized.  Original Report Authenticated By: Waneta Martins, M.D.   Ct Abdomen Pelvis W Contrast  05/13/2011  *RADIOLOGY REPORT*  Clinical Data: Pulmonary lesion noted on neck CTA  CT CHEST WITH CONTRAST,CT ABDOMEN AND PELVIS WITH CONTRAST  Technique:  Multidetector CT imaging of the chest was performed following the standard protocol during bolus administration of intravenous contrast.,Technique:  Multidetector CT imaging of the abdomen and pelvis was performed following the standard protoc  Contrast: 80mL OMNIPAQUE IOHEXOL 300 MG/ML IJ SOLN  Comparison: 05/09/2011 neck CT A  Findings:  Chest:  Ground-glass opacity within the right upper lobe anteriorly is nonspecific however appears less dense than on the recent neck CT. 3 mm right middle lobe nodule on image 32 of series 3.  No pneumothorax.  No pleural effusion.  Central airways are patent.  Scattered atherosclerotic calcification of the aorta and branch vessels.  Occluded left vertebral artery.  No aneurysmal dilatation or dissection.  Aortic valve calcification.  Coronary artery calcification.  Extensive mural plaque within the descending thoracic aorta.  No intrathoracic lymphadenopathy.  Abdomen  pelvis: Mild lobular liver contour.  Status post cholecystectomy.  No biliary ductal dilatation.  Unremarkable spleen, pancreas, right adrenal gland.  There is adreniform enlargement/thickening of the left adrenal gland.  There are several too small to further characterize hypodensities within each kidney.  There is a nonobstructing stone within the lower pole of the left kidney.  No hydronephrosis or hydroureter.  No bowel obstruction.  No CT evidence for colitis.  Surgical clips right lower quadrant.  Anterior abdominal wall laxity, containing a segment of transverse colon.  No free intraperitoneal air or fluid.  Infrarenal aortobi-iliac graft, patent.  Streak artifact from the patient's hip arthroplasty limits evaluation of the pelvis.  Bladder appears thin-walled.  Absent uterus.  No adnexal mass.  Osteopenia. Multilevel degenerative changes of the imaged spine. No acute or aggressive appearing osseous lesion.  IMPRESSION:  Right upper lobe ground-glass opacity has a different configuration from recent neck CT, therefore not favored to be a true nodule. However, a 21-month follow-up as previously recommended is reasonable to document resolution.  No acute process within the chest, abdomen, pelvis identified.  Bilateral renal hypodensities are incompletely characterized.  Original Report Authenticated By: Waneta Martins, M.D.   US Biopsy  05/14/2011  *RADIOLOGY REPORT*  Indication: Necrotic left-sided cervical lymph nodes as seen on prior cervical CTA.  ULTRASOUND GUIDED LEFT CERVICAL LYMPH NODE BIOPSY  Comparisons: Cervical CTA - 05/09/2011  Medications: None  Complications: None immediate  Findings / Technique:  Informed written consent was obtained from the patient after a discussion of the risks, benefits and alternatives to treatment. Questions regarding the procedure were encouraged and answered. Initial ultrasound scanning demonstrated an enlarged hypoechoic cervical lymph node within the high left  neck at the level of the left mandibular angle as was demonstrated on prior cervical CTA. The node measures approximately 1.5 cm in greatest dimension.  The procedure was planned.  A timeout was performed prior to the initiation of the procedure.  The operative was prepped and draped in the usual sterile fashion, and a sterile drape was applied covering the operative field.  A timeout was performed prior to the initiation of the procedure. Local anesthesia was provided with 1% lidocaine with epinephrine.  Under direct ultrasound guidance fine needle aspiration were obtained with a 22 gauge Inrad needle.  Samples were prepared by the cytotechnologists and submitted to pathology. Given the small size and location of the node, a core needle biopsy was not obtained.  Post procedure scan was negative for hematoma.  A dressing was placed.  The patient tolerated the procedure well without immediate postprocedural complication.  Impression:  Successful ultrasound guided fine needle aspiration of enlarged, likely necrotic left cervical lymph node.  Original Report Authenticated By: Waynard Reeds, M.D.    Medications:     . aspirin  81 mg Oral Daily  . atorvastatin  40 mg Oral q1800  . citalopram  20 mg Oral Daily  . enoxaparin  40 mg Subcutaneous Q24H  . feeding supplement  237 mL Oral BID BM  . gabapentin  600 mg Oral QHS  . insulin aspart  0-15 Units Subcutaneous TID WC  . insulin glargine  15 Units Subcutaneous QHS  . lisinopril  40 mg Oral Daily  . nicotine  21 mg Transdermal Daily  . rOPINIRole  3 mg Oral QHS  . zolpidem  5 mg Oral QHS  . DISCONTD: feeding supplement  237 mL Oral TID WC    Assessment: Principal Problem:  *CVA (cerebral infarction) Active Problems:  Hypertension  Diabetes mellitus  Mandibular mass  Lung abnormality   Plan: #1 CVA Patient with left vertebral artery occlusion. Will need to followup with [name] of neurology as an outpatient. Continue modification with  the Lipitor and blood pressure control. PT/OT/ST. Continue risk factor modification. Patient will be transferred to the inpatient rehabilitation service today.  #2 hypertension Stable continue lisinopril.  #3 hyperlipidemia Continue Lipitor.  #4 uncontrolled type 2 diabetes. Continue Lantus and resume metformin in about 3 days.  #5 mandibular mass Patient is status post ultrasound guided biopsy. We'll need to followup with Dr. Jenne Pane of ENT.  #6 lung abnormality Follow up CT scan in 3 months.  #7 disposition Will transfer patient  To inpatient rehabilitation.   LOS: 6 days   Haila Dena 05/14/2011, 2:05 PM

## 2011-05-14 NOTE — Progress Notes (Signed)
PT Cancellation Note  Treatment cancelled today due to patient receiving procedure or test, out of room. Will attempt later if able to arrange appropriate assistance.   Thanks!  Mariaceleste Herrera (Beverely Pace) Carleene Mains PT, DPT Acute Rehabilitation [phone]

## 2011-05-14 NOTE — Progress Notes (Signed)
Rehab admissions - Patient is back from her biopsy.  I can admit to inpatient rehab later today.  I spoke with Dr. Janee Morn who will work on discharge to rehab in the next couple of hours.  Pager [phone]

## 2011-05-14 NOTE — Progress Notes (Signed)
Rehab admissions - Continuing to follow.  Noted US biopsy not done yesterday.  Plans are for biopsy today.  If the biopsy is completed early enough today, can plan inpatient rehab admission for later today after biopsy.  I do have insurance authorization for inpatient rehab admission once all tests and procedures are complete.  Call me for questions.  Pager [phone]

## 2011-05-14 NOTE — Discharge Instructions (Signed)
STROKE/TIA DISCHARGE INSTRUCTIONS SMOKING Cigarette smoking nearly doubles your risk of having a stroke & is the single most alterable risk factor  If you smoke or have smoked in the last 12 months, you are advised to quit smoking for your health.  Most of the excess cardiovascular risk related to smoking disappears within a year of stopping.  Ask you doctor about anti-smoking medications  [place] Quit Line: 1-800-QUIT NOW  Free Smoking Cessation Classes ([phone]  CHOLESTEROL Know your levels; limit fat & cholesterol in your diet  Lipid Panel     Component Value Date/Time   CHOL 216* 05/08/2011 1230   TRIG 279* 05/08/2011 1230   HDL 25* 05/08/2011 1230   CHOLHDL 8.6 05/08/2011 1230   VLDL 56* 05/08/2011 1230   LDLCALC 135* 05/08/2011 1230      Many patients benefit from treatment even if their cholesterol is at goal.  Goal: Total Cholesterol (CHOL) less than 160  Goal:  Triglycerides (TRIG) less than 150  Goal:  HDL greater than 40  Goal:  LDL (LDLCALC) less than 100   BLOOD PRESSURE American Stroke Association blood pressure target is less that 120/80 mm/Hg  Your discharge blood pressure is:  BP: 198/79 mmHg  Monitor your blood pressure  Limit your salt and alcohol intake  Many individuals will require more than one medication for high blood pressure  DIABETES (A1c is a blood sugar average for last 3 months) Goal HGBA1c is under 7% (HBGA1c is blood sugar average for last 3 months)  Diabetes: {STROKE DC DIABETES:22357}    Lab Results  Component Value Date   HGBA1C 10.2* 05/08/2011     Your HGBA1c can be lowered with medications, healthy diet, and exercise.  Check your blood sugar as directed by your physician  Call your physician if you experience unexplained or low blood sugars.  PHYSICAL ACTIVITY/REHABILITATION Goal is 30 minutes at least 4 days per week    {STROKE DC ACTIVITY/REHAB:22359}  Activity decreases your risk of heart attack and stroke and makes your heart  stronger.  It helps control your weight and blood pressure; helps you relax and can improve your mood.  Participate in a regular exercise program.  Talk with your doctor about the best form of exercise for you (dancing, walking, swimming, cycling).  DIET/WEIGHT Goal is to maintain a healthy weight  Your discharge diet is: Dysphagia *** liquids Your height is:  Height: 5\' 3"  (160 cm) Your current weight is: Weight: 67.087 kg (147 lb 14.4 oz) Your Body Mass Index (BMI) is:  BMI (Calculated): 26.3   Following the type of diet specifically designed for you will help prevent another stroke.  Your goal weight range is:  ***  Your goal Body Mass Index (BMI) is 19-24.  Healthy food habits can help reduce 3 risk factors for stroke:  High cholesterol, hypertension, and excess weight.  RESOURCES Stroke/Support Group:  Call [phone]  they meet the 3rd Sunday of the month on the Rehab Unit at [facility], [place] ( no meetings June, July & Aug).  STROKE EDUCATION PROVIDED/REVIEWED AND GIVEN TO PATIENT Stroke warning signs and symptoms How to activate emergency medical system (call 911). Medications prescribed at discharge. Need for follow-up after discharge. Personal risk factors for stroke. Pneumonia vaccine given:   {STROKE DC YES/NO/DATE:22363} Flu vaccine given:   {STROKE DC YES/NO/DATE:22363} My questions have been answered, the writing is legible, and I understand these instructions.  I will adhere to these goals & educational materials that have  been provided to me after my discharge from the hospital.

## 2011-05-14 NOTE — H&P (Signed)
Physical Medicine and Rehabilitation Admission H&P  Chelsey Gallagher is an 61 y.o. female.  Chief Complaint   Patient presents with   .  Code Stroke   :  HPI: 61 year old right-handed female with history of diabetes mellitus and tobacco abuse admitted March 7 with right-sided weakness and mild slurred speech. 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 Echocardiogram with ejection fractional 70% grade 1 diastolic dysfunction. Carotid Doppler showed right 60-79% internal carotid artery stenosis. CTA head and neck showed normal origin of great vessels from aortic arch. There was noted posterior to the angle of the left mandible there was an area appearing 1.5 x 1.3 x 2.2 cm node suspicious for malignancy.Proximal right internal carotid artery with irregular plaque ulceration. 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 4 vessel cerebral arteriiogram March 11 by intervention radiology showed occluded left ICA proximal with a delayed string sign and 65-70% stenosis right ICA proximal .Dr. Jenne Pane of ENT followup March 11 for questionable chronic noted left neck and mandible with biopsy performed March 13 and results are pending. Noted blood pressure 207/102 on admission and placed on intravenous labetalol. She did not receive TPA. Neurology follow up and placed on aspirin as well as subcutaneous lovenox. Speech therapy placed on dysphagia 3 thin liquid diet. History of diabetes mellitus with hemoglobin A1c of 10.2 currently on sliding scale insulin as well as bedtime Lantus insulin. Physical occupational therapy treatments ongoing with recommendations for physical medicine rehabilitation consult and ultimately this patient was admitted to CIR after completing her stroke workup. Review of Systems  Respiratory: Positive for cough.  Gastrointestinal: Positive for nausea.  Neurological:  Positive for headaches.  Restless legs  Psychiatric/Behavioral: Positive for depression.  All other systems reviewed and are negative  Past Medical History   Diagnosis  Date   .  Diabetes mellitus    .  Hypertension    .  Depression    .  PAD (peripheral artery disease)      S/p bypass grafting, unclear exactly where   .  Nephrolithiasis     Past Surgical History   Procedure  Date   .  Total hip arthroplasty      Right   .  Abdominal hysterectomy    .  Cholecystectomy  1984   .  Appendectomy     History reviewed. No pertinent family history.  Social History: reports that she has been smoking Cigarettes. She has a 45 pack-year smoking history. She has never used smokeless tobacco. She reports that she does not drink alcohol or use illicit drugs.  Allergies:  Allergies   Allergen  Reactions   .  Codeine    .  Flexeril (Cyclobenzaprine Hcl)     Medications Prior to Admission   Medication  Dose  Route  Frequency  Provider  Last Rate  Last Dose   .  0.9 % sodium chloride infusion   Intravenous  Continuous PRN  Oneal Grout, MD  75 mL/hr at 05/12/11 1235  75 mL/hr at 05/12/11 1235   .  acetaminophen (TYLENOL) tablet 650 mg  650 mg  Oral  Q4H PRN  Devonne Doughty, MD      Or   .  acetaminophen (TYLENOL) suppository 650 mg  650 mg  Rectal  Q4H PRN  Devonne Doughty, MD     .  aspirin chewable tablet 81 mg  81 mg  Oral  Daily  Dorothea Ogle, MD   81 mg at 05/13/11 1005   .  atorvastatin (LIPITOR) tablet 40 mg  40 mg  Oral  q1800  Tara C Jernejcic, PA   40 mg at 05/13/11 1712   .  ceFAZolin (ANCEF) 1-5 GM-% IVPB         .  ceFAZolin (ANCEF) IVPB 1 g/50 mL premix  1 g  Intravenous  Once  Oneal Grout, MD   1 g at 05/12/11 1241   .  citalopram (CELEXA) tablet 20 mg  20 mg  Oral  Daily  Devonne Doughty, MD   20 mg at 05/13/11 1005   .  enoxaparin (LOVENOX) injection 40 mg  40 mg  Subcutaneous  Q24H  Devonne Doughty, MD   40 mg at 05/13/11 1005   .  feeding supplement (GLUCERNA SHAKE)  liquid 237 mL  237 mL  Oral  BID BM  Heather Cornelison Pitts, RD   237 mL at 05/13/11 1500   .  fentaNYL (SUBLIMAZE) injection   Intravenous  PRN  Oneal Grout, MD   25 mcg at 05/12/11 1236   .  gabapentin (NEURONTIN) tablet 600 mg  600 mg  Oral  To Major  Juliet Rude. Pickering, MD   600 mg at 05/08/11 0413   .  gabapentin (NEURONTIN) tablet 600 mg  600 mg  Oral  QHS  Devonne Doughty, MD   600 mg at 05/13/11 2217   .  heparin lock flush 100 unit/mL    PRN  Oneal Grout, MD   500 Units at 05/12/11 1250   .  insulin aspart (novoLOG) injection 0-15 Units  0-15 Units  Subcutaneous  TID WC  Devonne Doughty, MD   3 Units at 05/14/11 [phone]   .  insulin glargine (LANTUS) injection 15 Units  15 Units  Subcutaneous  QHS  Dorothea Ogle, MD   15 Units at 05/13/11 2228   .  iohexol (OMNIPAQUE) 300 MG/ML solution 20 mL  20 mL  Oral  Q1 Hr x 2  Medication Radiologist, MD     .  iohexol (OMNIPAQUE) 300 MG/ML solution 200 mL  200 mL  Intravenous  Once PRN  Oneal Grout, MD   80 mL at 05/12/11 1324   .  iohexol (OMNIPAQUE) 300 MG/ML solution 80 mL  80 mL  Intravenous  Once PRN  Medication Radiologist, MD   80 mL at 05/12/11 2301   .  iohexol (OMNIPAQUE) 350 MG/ML injection 50 mL  50 mL  Intravenous  Once PRN  Medication Radiologist, MD   50 mL at 05/09/11 1318   .  labetalol (NORMODYNE,TRANDATE) 5 MG/ML injection       10 mg at 05/08/11 0537   .  labetalol (NORMODYNE,TRANDATE) injection 10 mg  10 mg  Intravenous  Q2H PRN  Devonne Doughty, MD   10 mg at 05/11/11 1906   .  lisinopril (PRINIVIL,ZESTRIL) tablet 40 mg  40 mg  Oral  Daily  Dorothea Ogle, MD   40 mg at 05/13/11 1004   .  lisinopril (PRINIVIL,ZESTRIL) tablet 40 mg  40 mg  Oral  STAT  Dorothea Ogle, MD   40 mg at 05/10/11 1122   .  methocarbamol (ROBAXIN) tablet 500 mg  500 mg  Oral  Q8H PRN  Dorothea Ogle, MD   500 mg at 05/13/11 2218   .  midazolam (VERSED) 5 MG/5ML injection   Intravenous  PRN  Oneal Grout, MD   1 mg at 05/12/11 1236   .   nicotine (NICODERM CQ - dosed in mg/24 hours) patch 21 mg  21 mg  Transdermal  Daily  Dorothea Ogle, MD   21 mg at 05/13/11 1005   .  ondansetron (ZOFRAN) injection 4 mg  4 mg  Intravenous  Q6H PRN  Devonne Doughty, MD   4 mg at 05/10/11 1611   .  rOPINIRole (REQUIP) tablet 3 mg  3 mg  Oral  To Major  Nathan R. Pickering, MD   3 mg at 05/08/11 0413   .  rOPINIRole (REQUIP) tablet 3 mg  3 mg  Oral  QHS  Devonne Doughty, MD   3 mg at 05/13/11 2217   .  senna-docusate (Senokot-S) tablet 1 tablet  1 tablet  Oral  QHS PRN  Devonne Doughty, MD     .  zolpidem (AMBIEN) tablet 5 mg  5 mg  Oral  QHS  Dorothea Ogle, MD   5 mg at 05/13/11 2218   .  DISCONTD: 0.9 % sodium chloride infusion   Intravenous  Continuous  Juliet Rude. Pickering, MD  100 mL/hr at 05/08/11 0321    .  DISCONTD: 0.9 % sodium chloride infusion   Intravenous  Continuous  Stephani Police, PA  50 mL/hr at 05/09/11 0612  50 mL/hr at 05/09/11 0612   .  DISCONTD: aspirin chewable tablet 81 mg  81 mg  Oral  Daily  Tara C Jernejcic, PA     .  DISCONTD: aspirin suppository 300 mg  300 mg  Rectal  Daily  Devonne Doughty, MD     .  DISCONTD: aspirin tablet 325 mg  325 mg  Oral  Daily  Devonne Doughty, MD   325 mg at 05/12/11 1107   .  DISCONTD: feeding supplement (GLUCERNA SHAKE) liquid 237 mL  237 mL  Oral  TID WC  Ailene Ards, RD   237 mL at 05/13/11 1150   .  DISCONTD: gabapentin (NEURONTIN) capsule 300 mg  300 mg  Oral  Q supper  Devonne Doughty, MD   300 mg at 05/09/11 1721   .  DISCONTD: gabapentin (NEURONTIN) tablet 300-600 mg  300-600 mg  Oral  BID  Devonne Doughty, MD     .  DISCONTD: lisinopril (PRINIVIL,ZESTRIL) tablet 20 mg  20 mg  Oral  Daily  Devonne Doughty, MD   20 mg at 05/09/11 1032   .  DISCONTD: simvastatin (ZOCOR) tablet 20 mg  20 mg  Oral  q1800  Devonne Doughty, MD   20 mg at 05/09/11 1722    No current outpatient prescriptions on file as of 05/14/2011.    Home:  Home Living  Lives With: Alone  Receives Help From: Family  Type of Home: House   Home Layout: One level  Home Access: Stairs to enter  Entrance Stairs-Rails: None  Entrance Stairs-Number of Steps: 1 (inch step in)  Bathroom Shower/Tub: Walk-in shower;Door  [employer] Toilet: Handicapped height  Bathroom Accessibility: Yes  How Accessible: Accessible via walker  Home Adaptive Equipment: Walker - rolling;Bedside commode/3-in-1;Reacher  Functional History:  Prior Function  Level of Independence: Independent with basic ADLs;Independent with homemaking with ambulation;Independent with transfers;Independent with gait  Able to Take Stairs?: Yes  Driving: Yes  Vocation: Retired  Leisure: Hobbies-yes (Comment)  Comments: reading,  computer  Functional Status:  Mobility:  Bed Mobility  Bed Mobility: Yes  Rolling Right: 3: Mod assist;With rail  Rolling Right Details (indicate cue type and reason): assist to control movement and for safety due to pt's impulsive behavior  Rolling Left: 5: Supervision;With rail  Rolling Left Details (indicate cue type and reason): VC for hand placement and sequencing. No physical assist needed  Right Sidelying to Sit: (HOB 50 degrees)  Right Sidelying to Sit Details (indicate cue type and reason): assist to support trunk and shoulders OOB (Assist with RLE for control)  Left Sidelying to Sit: With rails;4: Min assist;HOB flat  Left Sidelying to Sit Details (indicate cue type and reason): Assist through hand pull up per pt request. Encouragement to maximize independence.  Sitting - Scoot to Edge of Bed: 5: Supervision  Sitting - Scoot to Edge of Bed Details (indicate cue type and reason): VC for weight shifting. Pt required increased time for initiation and for motor planning. Cues for lateral weightshifts to unload contralateral hip.  Transfers  Transfers: Yes  Sit to Stand: 1: +2 Total assist;Patient percentage (comment);From bed;From chair/3-in-1;With armrests;With upper extremity assist (70%)  Sit to Stand Details (indicate cue type and  reason): assist to block Rt knee to prevent buckling. Facilitation through pelvis for control of midline positioning. support again given to RUE to prevent hanging at side and help prevent subluxation  Stand to Sit: 3: Mod assist;To chair/3-in-1;With armrests;With upper extremity assist  Stand to Sit Details: VC throughout for sequencing and control, especially on decent. Pt required increased cueign to place hands on chair to assist with descent.  Stand Pivot Transfers: 1: +2 Total assist;Patient percentage (comment) (65%)  Stand Pivot Transfer Details (indicate cue type and reason): Transferred from bed to 3n1. Facilitation of Rt. quad to promote modulation of knee control to recurvatuum and excessive flexion. Manual cues given throughout bil. posterior hips to facilitate anterior pelvic tilt and midline orientation for safety during transfer. Pt able to flex R hip with cueing and minimal tactile cues  Ambulation/Gait  Ambulation/Gait: Yes  Ambulation/Gait Assistance: 1: +2 Total assist;Patient percentage (comment)  Ambulation/Gait Assistance Details (indicate cue type and reason): pt = 50%; Tactile cues given throughout bil. posterior hips to facilitate anterior pelvic tilt and midline orientation for safety during ambulation. PT to initiate first step with Rt. LE and assist with foot clearance, pt able to initiate the rest. Facilitation of bilateral weightshifting with stepping. Facilitation through Rt. quads for grading knee flexion/extension during Rt. LE stance. Pt also has tendency to adduct Rt. LE and supinate Rt. foot placing her at high risk for inversion sprain (family, pt, and nursing educated on this).  Ambulation Distance (Feet): 4 Feet (Approximately 4 steps bilaterally (8 total))  Assistive device: 1 person hand held assist  Gait Pattern: Step-to pattern;Shuffle;Decreased hip/knee flexion - right;Decreased stance time - right;Decreased weight shift to right;Decreased weight shift to  left;Trunk flexed;Scissoring;Right genu recurvatum  Stairs: No   ADL:  ADL  Upper Body Dressing: Performed;Minimal assistance  Upper Body Dressing Details (indicate cue type and reason): Pt donned gown with min assist for stitting balance and to thread RUE through sleeve.  Where Assessed - Upper Body Dressing: Sitting, bed;Supported  Toilet Transfer: Simulated;+2 Total assistance;Comment for patient % (40%)  Toilet Transfer Details (indicate cue type and reason): assist to block Rt. knee to prevent buckling. support to Rt. UE to prevent it from hanging at side. max manual cues given throughout bil. posterior hips to facilitate  anterior pelvic tilt and midline orientation. transferred from bed to chair.  Toilet Transfer Method: Archivist: Other (comment) (chair)  Toileting - Clothing Manipulation: Performed;Moderate assistance  Toileting - Clothing Manipulation Details (indicate cue type and reason): Mod assist to bring gown over hips.  Where Assessed - Toileting Clothing Manipulation: Sit to stand from 3-in-1 or toilet  Toileting - Hygiene: Performed;Set up  Toileting - Hygiene Details (indicate cue type and reason): setup to fold toilet paper  Where Assessed - Toileting Hygiene: Sit on 3-in-1 or toilet  ADL Comments: Pt sat EOB to perform dynamic sitting balance tasks with mod assist given to bil. posterior hips and Rt anterior tibialis due to increase R LE tone. Pt performed static/dynamic standing balance tasks at sink with chair behind pt. Pt required +2total pt 40-50% standing at sink to provide manual assist to Rt. knee and bil posterior hips to facilitate midline orientation and promote anterior pelvic tilt. Max VC to use mirror as enviromental cue to maintain midline orientation. Mod assist to Rt. UE for WB on sink with tactile cues given to triceps.  Cognition:  Cognition  Overall Cognitive Status: Appears within functional limits for tasks assessed [facility] for  basic, need to assess higher level cognition)  Arousal/Alertness: Awake/alert  Orientation Level: Oriented X4  Attention: Sustained  Memory: Impaired  Memory Impairment: Decreased recall of new information  Awareness: (INTELLECTUAL AND EMERGENT WFL'S, NEED TO ASSESS ANTICIPATORY)  Problem Solving: Appears intact (INTACT FOR MODERATE LEVEL VERBAL PROB SOLV)  Safety/Judgment: Appears intact  Cognition  Arousal/Alertness: Awake/alert  Overall Cognitive Status: Impaired  Orientation Level: Oriented X4  Safety/Judgement: Decreased safety judgement for tasks assessed  Decreased Safety/Judgement: Impulsive  Safety/Judgement - Other Comments: Pt very impulsive with movements and required constant cuing to slow down.  Awareness of Deficits: Other (comment) (aware of deficits, although still impulsive)  Blood pressure 154/72, pulse 93, temperature 98.4 F (36.9 C), temperature source Oral, resp. rate 22, height 5\' 3"  (1.6 m), weight 67.087 kg (147 lb 14.4 oz), SpO2 94.00%.  Physical Exam  Vitals reviewed.  Constitutional: She is oriented to person, place, and time. She appears well-developed.  HENT:  Head: Normocephalic. PERRL, EOMI Neck: Normal range of motion. Neck supple. No thyromegaly present.  Cardiovascular: Normal rate and regular rhythm.  Pulmonary/Chest: Effort normal and breath sounds normal. She has no wheezes.  Abdominal: Soft. She exhibits no distension. There is no tenderness.  Musculoskeletal: She exhibits no edema.  Neurological: She is alert and oriented to person, place, and time.  She is somewhat hard of hearing (Right more affected than left). She wears hearing aids on either ear. Shows good awareness of deficits. Follow three-step commands. Dysarthric speech. Gaze intact. Poor visual acuity overall. Right facial weakness and sensory loss with tongue deviation. A few beats of nystagmus with superior gaze and left gaze. RUE 0'/5. RLE 2+ prox to 0/trace distally with ADF and  1+/5 APF. Left side motor and sensory nromal. Sensation 1/2 on right (feels pins and needles). She can sense pain however.  Skin: Skin is warm and dry.  Psychiatric: She has a normal mood and affect.  Mild anxiety  Results for orders placed during the hospital encounter of 05/08/11 (from the past 48 hour(s))   GLUCOSE, CAPILLARY Status: Abnormal    Collection Time    05/12/11 11:48 AM   Component  Value  Range  Comment    Glucose-Capillary  218 (*)  70 - 99 (mg/dL)  GLUCOSE, CAPILLARY Status: Abnormal    Collection Time    05/12/11 4:44 PM   Component  Value  Range  Comment    Glucose-Capillary  113 (*)  70 - 99 (mg/dL)    GLUCOSE, CAPILLARY Status: Abnormal    Collection Time    05/12/11 9:39 PM   Component  Value  Range  Comment    Glucose-Capillary  117 (*)  70 - 99 (mg/dL)     Comment 1  Documented in Chart      Comment 2  Notify RN     CBC Status: Abnormal    Collection Time    05/13/11 5:00 AM   Component  Value  Range  Comment    WBC  7.3  4.0 - 10.5 (K/uL)     RBC  5.03  3.87 - 5.11 (MIL/uL)     Hemoglobin  16.0 (*)  12.0 - 15.0 (g/dL)     HCT  16.1  09.6 - 46.0 (%)     MCV  90.5  78.0 - 100.0 (fL)     MCH  31.8  26.0 - 34.0 (pg)     MCHC  35.2  30.0 - 36.0 (g/dL)     RDW  04.5  40.9 - 15.5 (%)     Platelets  154  150 - 400 (K/uL)    BASIC METABOLIC PANEL Status: Abnormal    Collection Time    05/13/11 5:00 AM   Component  Value  Range  Comment    Sodium  129 (*)  135 - 145 (mEq/L)     Potassium  3.7  3.5 - 5.1 (mEq/L)     Chloride  95 (*)  96 - 112 (mEq/L)     CO2  27  19 - 32 (mEq/L)     Glucose, Bld  139 (*)  70 - 99 (mg/dL)     BUN  12  6 - 23 (mg/dL)     Creatinine, Ser  8.11 (*)  0.50 - 1.10 (mg/dL)     Calcium  9.8  8.4 - 10.5 (mg/dL)     GFR calc non Af Amer  >90  >90 (mL/min)     GFR calc Af Amer  >90  >90 (mL/min)    GLUCOSE, CAPILLARY Status: Abnormal    Collection Time    05/13/11 7:00 AM   Component  Value  Range  Comment    Glucose-Capillary  110  (*)  70 - 99 (mg/dL)     Comment 1  Documented in Chart      Comment 2  Notify RN     GLUCOSE, CAPILLARY Status: Abnormal    Collection Time    05/13/11 11:39 AM   Component  Value  Range  Comment    Glucose-Capillary  153 (*)  70 - 99 (mg/dL)    GLUCOSE, CAPILLARY Status: Abnormal    Collection Time    05/13/11 10:22 PM   Component  Value  Range  Comment    Glucose-Capillary  136 (*)  70 - 99 (mg/dL)     Comment 1  Notify RN     GLUCOSE, CAPILLARY Status: Abnormal    Collection Time    05/14/11 6:49 AM   Component  Value  Range  Comment    Glucose-Capillary  154 (*)  70 - 99 (mg/dL)     Ct Chest W Contrast  05/13/2011 *RADIOLOGY REPORT* Clinical Data: Pulmonary lesion noted on neck CTA CT CHEST WITH CONTRAST,CT ABDOMEN AND  PELVIS WITH CONTRAST Technique: Multidetector CT imaging of the chest was performed following the standard protocol during bolus administration of intravenous contrast.,Technique: Multidetector CT imaging of the abdomen and pelvis was performed following the standard protoc Contrast: 80mL OMNIPAQUE IOHEXOL 300 MG/ML IJ SOLN Comparison: 05/09/2011 neck CT A Findings: Chest: Ground-glass opacity within the right upper lobe anteriorly is nonspecific however appears less dense than on the recent neck CT. 3 mm right middle lobe nodule on image 32 of series 3. No pneumothorax. No pleural effusion. Central airways are patent. Scattered atherosclerotic calcification of the aorta and branch vessels. Occluded left vertebral artery. No aneurysmal dilatation or dissection. Aortic valve calcification. Coronary artery calcification. Extensive mural plaque within the descending thoracic aorta. No intrathoracic lymphadenopathy. Abdomen pelvis: Mild lobular liver contour. Status post cholecystectomy. No biliary ductal dilatation. Unremarkable spleen, pancreas, right adrenal gland. There is adreniform enlargement/thickening of the left adrenal gland. There are several too small to further  characterize hypodensities within each kidney. There is a nonobstructing stone within the lower pole of the left kidney. No hydronephrosis or hydroureter. No bowel obstruction. No CT evidence for colitis. Surgical clips right lower quadrant. Anterior abdominal wall laxity, containing a segment of transverse colon. No free intraperitoneal air or fluid. Infrarenal aortobi-iliac graft, patent. Streak artifact from the patient's hip arthroplasty limits evaluation of the pelvis. Bladder appears thin-walled. Absent uterus. No adnexal mass. Osteopenia. Multilevel degenerative changes of the imaged spine. No acute or aggressive appearing osseous lesion. IMPRESSION: Right upper lobe ground-glass opacity has a different configuration from recent neck CT, therefore not favored to be a true nodule. However, a 35-month follow-up as previously recommended is reasonable to document resolution. No acute process within the chest, abdomen, pelvis identified. Bilateral renal hypodensities are incompletely characterized. Original Report Authenticated By: Waneta Martins, M.D.  Ct Abdomen Pelvis W Contrast  05/13/2011 *RADIOLOGY REPORT* Clinical Data: Pulmonary lesion noted on neck CTA CT CHEST WITH CONTRAST,CT ABDOMEN AND PELVIS WITH CONTRAST Technique: Multidetector CT imaging of the chest was performed following the standard protocol during bolus administration of intravenous contrast.,Technique: Multidetector CT imaging of the abdomen and pelvis was performed following the standard protoc Contrast: 80mL OMNIPAQUE IOHEXOL 300 MG/ML IJ SOLN Comparison: 05/09/2011 neck CT A Findings: Chest: Ground-glass opacity within the right upper lobe anteriorly is nonspecific however appears less dense than on the recent neck CT. 3 mm right middle lobe nodule on image 32 of series 3. No pneumothorax. No pleural effusion. Central airways are patent. Scattered atherosclerotic calcification of the aorta and branch vessels. Occluded left vertebral  artery. No aneurysmal dilatation or dissection. Aortic valve calcification. Coronary artery calcification. Extensive mural plaque within the descending thoracic aorta. No intrathoracic lymphadenopathy. Abdomen pelvis: Mild lobular liver contour. Status post cholecystectomy. No biliary ductal dilatation. Unremarkable spleen, pancreas, right adrenal gland. There is adreniform enlargement/thickening of the left adrenal gland. There are several too small to further characterize hypodensities within each kidney. There is a nonobstructing stone within the lower pole of the left kidney. No hydronephrosis or hydroureter. No bowel obstruction. No CT evidence for colitis. Surgical clips right lower quadrant. Anterior abdominal wall laxity, containing a segment of transverse colon. No free intraperitoneal air or fluid. Infrarenal aortobi-iliac graft, patent. Streak artifact from the patient's hip arthroplasty limits evaluation of the pelvis. Bladder appears thin-walled. Absent uterus. No adnexal mass. Osteopenia. Multilevel degenerative changes of the imaged spine. No acute or aggressive appearing osseous lesion. IMPRESSION: Right upper lobe ground-glass opacity has a different configuration from recent neck  CT, therefore not favored to be a true nodule. However, a 64-month follow-up as previously recommended is reasonable to document resolution. No acute process within the chest, abdomen, pelvis identified. Bilateral renal hypodensities are incompletely characterized. Original Report Authenticated By: Waneta Martins, M.D.  Ir Angiogram Extremity Left  05/13/2011 *RADIOLOGY REPORT* Clinical Data: Right-sided weakness. Diplopia. Brain stem stroke. BILATERAL COMMON CAROTID ARTERIOGRAMS AND BILATERAL VERTEBRAL ARTERY ANGIOGRAMS Comparison: MRI of the brain of 05/08/2011, and CT angiogram of the brain of 05/09/2011. Following a full explanation of the procedure along with the potential associated complications, an informed  witnessed consent was obtained. The right groin was prepped and draped in the usual sterile fashion. Thereafter using modified Seldinger technique, transfemoral access into the right common femoral artery was obtained without difficulty. Over a 0.035-inch guidewire, a 5- [place] Pinnacle sheath was inserted. Through this and also over a 0.035-inch guidewire, a 5-French JB1 catheter was advanced to the aortic arch region and selectively positioned in the right common carotid artery, the right vertebral artery, the left common carotid artery and the left subclavian artery. There were no acute complications. The patient tolerated the procedure well. Medications utilized: Versed 1 mg IV. Fentanyl 25 mcg IV. Contrast: Omnipaque-300 approximately 60 ml. Findings: The right vertebral artery origin demonstrates approximately 50% stenosis. The vessel distal to this opacifies normally to the cranial skull base. There is normal opacification of the right posterior inferior cerebellar artery and the right vertebrobasilar junction. The basilar artery, the left posterior cerebral artery, the superior cerebellar arteries and the anterior-inferior cerebellar arteries opacify normally into the capillary and the venous phases. Retrograde opacification of the occluded left vertebrobasilar junction is seen. Also demonstrated is prompt opacification via the left posterior communicating artery of the left middle cerebral artery distribution and via leptomeningeal collaterals of the left anterior cerebral artery distribution. A moderate-sized area of hypoperfusion is seen in the left posterior frontal subcortical region cranial to the body of the corpus callosum. The left common carotid arteriogram demonstrates moderate stenosis of the left external carotid artery. Its branches are normally opacified. The left internal carotid artery at the bulb has a small focal ulceration along the posterior wall associated with approximately 67-70%  stenosis. More distally the vessel opacifies normally to the cranial skull base. The proximal petrous segment demonstrates a fusiform prominence. 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 This measures approximately 4.5 mm. Distal to this the supraclinoid segment is normal. The left middle and the left anterior cerebral artery opacify normally into the capillary and venous phases. The left posterior communicating artery is seen opacifying the left posterior cerebral and the left superior cerebellar artery distributions. Prompt opacification via the anterior communicating artery of the left anterior cerebral artery and partially the left middle cerebral artery distribution is noted. Mixing with unopacified blood is seen in the proximal left middle cerebral artery, due to unopacified blood flowing antegradely in the left internal carotid artery supraclinoid segment from the left posterior communicating artery as described above. The left common carotid arteriogram demonstrates mild atherosclerotic disease at the origin of the left external carotid artery. Its branches are normally opacified. 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 More distally there is retrograde opacification via the ipsilateral ophthalmic artery of the left internal carotid artery cavernous segment and the supraclinoid segment. Antegrade opacification is seen into the left middle cerebral artery with mixing of unopacified blood as described above previously. The delayed arterial  phase also demonstrates antegrade flow into the left internal carotid artery distal cervical petrous segment. A high frame angiogram centered over the carotid bifurcation demonstrates a delayed arterial string sign just above the level of the left internal carotid artery bulb. The left subclavian arteriogram demonstrates moderate atherosclerotic disease involving the proximal left  subclavian artery. There is approximately 40% stenosis of the left vertebral artery proximally. The vessel is seen to occlude completely at the level of C1. There is retrograde opacification of the ipsilateral occipital artery and the ascending pharyngeal artery with the delayed imaging demonstrating opacification of the left internal carotid artery. Partial opacification of the left external carotid artery branch is also seen. IMPRESSION 1. 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 2. 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 3. Partial retrograde opacification of the left internal carotid artery in the cavernous segment from the ipsilateral ophthalmic artery from the external carotid artery branches, and also from the left vertebral artery retrograde opacification of the left ascending pharyngeal artery and the occipital artery as described. 4. 50% stenosis of the dominant right vertebral artery at its origin. Original Report Authenticated By: Oneal Grout, M.D.  Ir Angio Intra Extracran Sel Com Carotid Innominate Bilat Mod Sed  05/13/2011 *RADIOLOGY REPORT* Clinical Data: Right-sided weakness. Diplopia. Brain stem stroke. BILATERAL COMMON CAROTID ARTERIOGRAMS AND BILATERAL VERTEBRAL ARTERY ANGIOGRAMS Comparison: MRI of the brain of 05/08/2011, and CT angiogram of the brain of 05/09/2011. Following a full explanation of the procedure along with the potential associated complications, an informed witnessed consent was obtained. The right groin was prepped and draped in the usual sterile fashion. Thereafter using modified Seldinger technique, transfemoral access into the right common femoral artery was obtained without difficulty. Over a 0.035-inch guidewire, a 5- [place] Pinnacle sheath was inserted. Through this and also over a 0.035-inch guidewire, a 5-French JB1 catheter  was advanced to the aortic arch region and selectively positioned in the right common carotid artery, the right vertebral artery, the left common carotid artery and the left subclavian artery. There were no acute complications. The patient tolerated the procedure well. Medications utilized: Versed 1 mg IV. Fentanyl 25 mcg IV. Contrast: Omnipaque-300 approximately 60 ml. Findings: The right vertebral artery origin demonstrates approximately 50% stenosis. The vessel distal to this opacifies normally to the cranial skull base. There is normal opacification of the right posterior inferior cerebellar artery and the right vertebrobasilar junction. The basilar artery, the left posterior cerebral artery, the superior cerebellar arteries and the anterior-inferior cerebellar arteries opacify normally into the capillary and the venous phases. Retrograde opacification of the occluded left vertebrobasilar junction is seen. Also demonstrated is prompt opacification via the left posterior communicating artery of the left middle cerebral artery distribution and via leptomeningeal collaterals of the left anterior cerebral artery distribution. A moderate-sized area of hypoperfusion is seen in the left posterior frontal subcortical region cranial to the body of the corpus callosum. The left common carotid arteriogram demonstrates moderate stenosis of the left external carotid artery. Its branches are normally opacified. The left internal carotid artery at the bulb has a small focal ulceration along the posterior wall associated with approximately 67-70% stenosis. More distally the vessel opacifies normally to the cranial skull base. The proximal petrous segment demonstrates a fusiform prominence. 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 This measures approximately 4.5 mm. Distal to this the supraclinoid  segment is normal. The left middle and the left anterior cerebral artery opacify normally  into the capillary and venous phases. The left posterior communicating artery is seen opacifying the left posterior cerebral and the left superior cerebellar artery distributions. Prompt opacification via the anterior communicating artery of the left anterior cerebral artery and partially the left middle cerebral artery distribution is noted. Mixing with unopacified blood is seen in the proximal left middle cerebral artery, due to unopacified blood flowing antegradely in the left internal carotid artery supraclinoid segment from the left posterior communicating artery as described above. The left common carotid arteriogram demonstrates mild atherosclerotic disease at the origin of the left external carotid artery. Its branches are normally opacified. 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 More distally there is retrograde opacification via the ipsilateral ophthalmic artery of the left internal carotid artery cavernous segment and the supraclinoid segment. Antegrade opacification is seen into the left middle cerebral artery with mixing of unopacified blood as described above previously. The delayed arterial phase also demonstrates antegrade flow into the left internal carotid artery distal cervical petrous segment. A high frame angiogram centered over the carotid bifurcation demonstrates a delayed arterial string sign just above the level of the left internal carotid artery bulb. The left subclavian arteriogram demonstrates moderate atherosclerotic disease involving the proximal left subclavian artery. There is approximately 40% stenosis of the left vertebral artery proximally. The vessel is seen to occlude completely at the level of C1. There is retrograde opacification of the ipsilateral occipital artery and the ascending pharyngeal artery with the delayed imaging demonstrating opacification of the left internal carotid artery. Partial opacification of the left external  carotid artery branch is also seen. IMPRESSION 1. 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 2. 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 3. Partial retrograde opacification of the left internal carotid artery in the cavernous segment from the ipsilateral ophthalmic artery from the external carotid artery branches, and also from the left vertebral artery retrograde opacification of the left ascending pharyngeal artery and the occipital artery as described. 4. 50% stenosis of the dominant right vertebral artery at its origin. Original Report Authenticated By: Oneal Grout, M.D.  Ir Angio Vertebral Sel Vertebral Uni R Mod Sed  05/13/2011 *RADIOLOGY REPORT* Clinical Data: Right-sided weakness. Diplopia. Brain stem stroke. BILATERAL COMMON CAROTID ARTERIOGRAMS AND BILATERAL VERTEBRAL ARTERY ANGIOGRAMS Comparison: MRI of the brain of 05/08/2011, and CT angiogram of the brain of 05/09/2011. Following a full explanation of the procedure along with the potential associated complications, an informed witnessed consent was obtained. The right groin was prepped and draped in the usual sterile fashion. Thereafter using modified Seldinger technique, transfemoral access into the right common femoral artery was obtained without difficulty. Over a 0.035-inch guidewire, a 5- [place] Pinnacle sheath was inserted. Through this and also over a 0.035-inch guidewire, a 5-French JB1 catheter was advanced to the aortic arch region and selectively positioned in the right common carotid artery, the right vertebral artery, the left common carotid artery and the left subclavian artery. There were no acute complications. The patient tolerated the procedure well. Medications utilized: Versed 1 mg IV. Fentanyl 25 mcg IV. Contrast: Omnipaque-300 approximately 60 ml. Findings: The right vertebral artery  origin demonstrates approximately 50% stenosis. The vessel distal to this opacifies normally to the cranial skull base. There is normal opacification of the right  posterior inferior cerebellar artery and the right vertebrobasilar junction. The basilar artery, the left posterior cerebral artery, the superior cerebellar arteries and the anterior-inferior cerebellar arteries opacify normally into the capillary and the venous phases. Retrograde opacification of the occluded left vertebrobasilar junction is seen. Also demonstrated is prompt opacification via the left posterior communicating artery of the left middle cerebral artery distribution and via leptomeningeal collaterals of the left anterior cerebral artery distribution. A moderate-sized area of hypoperfusion is seen in the left posterior frontal subcortical region cranial to the body of the corpus callosum. The left common carotid arteriogram demonstrates moderate stenosis of the left external carotid artery. Its branches are normally opacified. The left internal carotid artery at the bulb has a small focal ulceration along the posterior wall associated with approximately 67-70% stenosis. More distally the vessel opacifies normally to the cranial skull base. The proximal petrous segment demonstrates a fusiform prominence. 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 This measures approximately 4.5 mm. Distal to this the supraclinoid segment is normal. The left middle and the left anterior cerebral artery opacify normally into the capillary and venous phases. The left posterior communicating artery is seen opacifying the left posterior cerebral and the left superior cerebellar artery distributions. Prompt opacification via the anterior communicating artery of the left anterior cerebral artery and partially the left middle cerebral artery distribution is noted. Mixing with unopacified blood is seen in the proximal left middle  cerebral artery, due to unopacified blood flowing antegradely in the left internal carotid artery supraclinoid segment from the left posterior communicating artery as described above. The left common carotid arteriogram demonstrates mild atherosclerotic disease at the origin of the left external carotid artery. Its branches are normally opacified. 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 More distally there is retrograde opacification via the ipsilateral ophthalmic artery of the left internal carotid artery cavernous segment and the supraclinoid segment. Antegrade opacification is seen into the left middle cerebral artery with mixing of unopacified blood as described above previously. The delayed arterial phase also demonstrates antegrade flow into the left internal carotid artery distal cervical petrous segment. A high frame angiogram centered over the carotid bifurcation demonstrates a delayed arterial string sign just above the level of the left internal carotid artery bulb. The left subclavian arteriogram demonstrates moderate atherosclerotic disease involving the proximal left subclavian artery. There is approximately 40% stenosis of the left vertebral artery proximally. The vessel is seen to occlude completely at the level of C1. There is retrograde opacification of the ipsilateral occipital artery and the ascending pharyngeal artery with the delayed imaging demonstrating opacification of the left internal carotid artery. Partial opacification of the left external carotid artery branch is also seen. IMPRESSION 1. 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 2. 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 3. Partial retrograde opacification of the left internal carotid artery in the cavernous segment from the ipsilateral ophthalmic artery  from the external carotid artery branches, and also from the left vertebral artery retrograde opacification of the left ascending pharyngeal artery and the occipital artery as described. 4. 50% stenosis of the dominant right vertebral artery at its origin. Original Report Authenticated By: Oneal Grout, M.D.   Post Admission Physician Evaluation:  1. Functional deficits secondary to  left paracentral medulla infarction and small acute posterior left cerebellar infarction. 2.  Patient is admitted to receive collaborative, interdisciplinary care between the physiatrist, rehab nursing staff, and therapy team. 3. Patient's level of medical complexity and substantial therapy needs in context of that medical necessity cannot be provided at a lesser intensity of care such as a SNF. 4. Patient has experienced substantial functional loss from his/her baseline which was documented above under the "Functional History" and "Functional Status" headings. Judging by the patient's diagnosis, physical exam, and functional history, the patient has potential for functional progress which will result in measurable gains while on inpatient rehab. These gains will be of substantial and practical use upon discharge in facilitating mobility and self-care at the household level. 5. Physiatrist will provide 24 hour management of medical needs as well as oversight of the therapy plan/treatment and provide guidance as appropriate regarding the interaction of the two. 6. 24 hour rehab nursing will assist with bladder management, bowel management, safety, skin/wound care, disease management, medication administration, pain management and patient education and help integrate therapy concepts, techniques,education, etc. 7. PT will assess and treat for: LES, ROM, NMR, fxnl mobility, adaptive equipment training. Goals are: min assist. 8. OT will assess and treat for: UES, ROM, NMR, ADL's, fxnl mobility, adaptive equipment and family  ed. Goals are: mod I with simple tasks to min+ assist. 9. SLP will assess and treat for: speech intelligibility and communication. Goals are: mod I. 10. Case Management and Social Worker will assess and treat for psychological issues and discharge planning. 11. Team conference will be held weekly to assess progress toward goals and to determine barriers to discharge. 12. Patient will receive at least 3 hours of therapy per day at least 5 days per week. 13. ELOS and Prognosis: 3 weeks good  Medical Problem List and Plan:  1. 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 DVT Prophylaxis/Anticoagulation: Subcutaneous lovenox. Monitor platelet counts and any signs of bleeding  3. Left neck mandible mass. Await biopsy today  4. 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 Plan outpatient followup for revascularization with intervention radiology  5. Diabetes mellitus. Hemoglobin A1c of 10.2. Lantus insulin 15 units at bedtime. Check blood sugars a.c. and at bedtime. Patient on glucophage 1000 mg twice daily prior to admission  6. Hypertension. Lisinopril 40 mg daily. Monitor with increased activity  7. Tobacco abuse. Nicoderm patch.  8. Restless leg syndrome. Requip each bedtime  9. Depression.celexa. Provide emotional support and positive reinforcement  10. Hyperlipidemia. Lipitor

## 2011-05-14 NOTE — Procedures (Signed)
Technically successful US guided biopsy of necrotic left cervical lymph node.  No immediate complications.

## 2011-05-14 NOTE — Progress Notes (Signed)
Patient arrived with family to 40 at 42. Patient alert and oriented x 4 but hard of hearing, has bilateral hearing aids at bedside. Slurred speech noted. Patient and family oriented to unit, rehab schedule, safety plan, and call bell system. Understanding verbalized by patient and family. Hedy Camara

## 2011-05-14 NOTE — Progress Notes (Signed)
Occupational Therapy Treatment Patient Details Name: Chelsey Gallagher MRN: [id number] DOB: 1951/02/20 Today's Date: 05/14/2011  OT Assessment/Plan OT Assessment/Plan Comments on Treatment Session: Pt. demonstrates improved vision, and demonstrates beginning movement of Rt. UE.  Pt. also demonstrates improved independence with transfers,and balance OT Plan: Discharge plan remains appropriate OT Frequency: Min 2X/week Recommendations for Other Services: Rehab consult Follow Up Recommendations: Inpatient Rehab Equipment Recommended: Defer to next venue OT Goals ADL Goals ADL Goal: Toilet Transfer - Progress: Met Miscellaneous OT Goals OT Goal: Miscellaneous Goal #1 - Progress: Met  OT Treatment Precautions/Restrictions  Precautions Precautions: Fall Restrictions Weight Bearing Restrictions: No   ADL ADL Toilet Transfer: Performed;Moderate assistance Toilet Transfer Details (indicate cue type and reason): faciliation Rt. LE, hip and rib cage Toilet Transfer Method: Stand pivot Toilet Transfer Equipment: Bedside commode Toileting - Clothing Manipulation: Performed;Moderate assistance Toileting - Clothing Manipulation Details (indicate cue type and reason): gown Where Assessed - Toileting Clothing Manipulation: Standing Toileting - Hygiene: Performed;Supervision/safety Where Assessed - Toileting Hygiene: Sit on 3-in-1 or toilet ADL Comments: Pt. sitting cross legged in bed.  with Rt. UE propped on pillows.  Pt. states that she has not been able to move Rt. UE despite signficant effort.  Pt. moved to EOB with min assist with facilitation of Rt. LE and Rt. trunk - pt. tends to over use Lt. UE.  Pt. sat EOB with min guard A, to min A when reaching to Rt. with Lt. UE.  Rt. UE place in weightbearing position, and pt. performed reaching activities off BOS with min guard assist to min assist with facilitation Rt. UE and Rt. trunk.  Pt. able to use Rt. UE as a support/stabilizer consistently  with min facilitation/assist.  Pt. able to initiate small ranges of shoulder flexion and extension with min facilitation.  Pt. demonstrated trace movement of digits.  VISION:  Pt. reports dizziness has subsided, and she now only has shadowing.  Pt. demonstrates difficulty sustaining lateral gaze each eye.  Nystagmus noted bil. eyes.   Mobility    Exercises    End of Session General Behavior During Session: [facility] for tasks performed Cognition: Impaired Cognitive Impairment: Pt. impulsive with decreased safety awareness  Chelsey Gallagher, Chelsey Gallagher M  05/14/2011, 11:03 PM

## 2011-05-14 NOTE — ED Notes (Signed)
Pt reports eating breakfast this morning.  Would like to do biopsy without sedation.  Dr Grace Isaac informed, agrees to no sedation due to pt eating.  Pt wants to "get it over with".

## 2011-05-15 DIAGNOSIS — I6330 Cerebral infarction due to thrombosis of unspecified cerebral artery: Secondary | ICD-10-CM

## 2011-05-15 DIAGNOSIS — G8110 Spastic hemiplegia affecting unspecified side: Secondary | ICD-10-CM

## 2011-05-15 DIAGNOSIS — Z5189 Encounter for other specified aftercare: Secondary | ICD-10-CM

## 2011-05-15 DIAGNOSIS — I639 Cerebral infarction, unspecified: Secondary | ICD-10-CM

## 2011-05-15 LAB — COMPREHENSIVE METABOLIC PANEL
AST: 27 U/L (ref 0–37)
Albumin: 3.4 g/dL — ABNORMAL LOW (ref 3.5–5.2)
Alkaline Phosphatase: 95 U/L (ref 39–117)
CO2: 31 mEq/L (ref 19–32)
Chloride: 97 mEq/L (ref 96–112)
Potassium: 4.1 mEq/L (ref 3.5–5.1)
Total Bilirubin: 0.7 mg/dL (ref 0.3–1.2)

## 2011-05-15 LAB — DIFFERENTIAL
Basophils Relative: 0 % (ref 0–1)
Eosinophils Absolute: 0.1 10*3/uL (ref 0.0–0.7)
Eosinophils Relative: 2 % (ref 0–5)
Monocytes Absolute: 0.7 10*3/uL (ref 0.1–1.0)
Monocytes Relative: 10 % (ref 3–12)

## 2011-05-15 LAB — CBC
MCHC: 34.9 g/dL (ref 30.0–36.0)
Platelets: 159 10*3/uL (ref 150–400)
RDW: 13.7 % (ref 11.5–15.5)

## 2011-05-15 LAB — GLUCOSE, CAPILLARY
Glucose-Capillary: 189 mg/dL — ABNORMAL HIGH (ref 70–99)
Glucose-Capillary: 118 mg/dL — ABNORMAL HIGH (ref 70–99)

## 2011-05-15 MED ORDER — ENSURE PUDDING PO PUDG
1.0000 | Freq: Every day | ORAL | Status: DC
  Administered 2011-05-15 – 2011-05-19 (×4): 1 via ORAL

## 2011-05-15 MED ORDER — PRO-STAT SUGAR FREE PO LIQD
30.0000 mL | Freq: Every day | ORAL | Status: DC
  Administered 2011-05-15 – 2011-05-19 (×5): 30 mL via ORAL
  Filled 2011-05-15 (×6): qty 30

## 2011-05-15 NOTE — Progress Notes (Signed)
Social Work Assessment and Plan Social Work Assessment and Plan  Patient Details  Name: Chelsey Gallagher MRN: [id number] Date of Birth: December 19, 1950  Today's Date: 05/15/2011  Problem List:  Patient Active Problem List  Diagnoses  . CVA (cerebral infarction)  . Hypertension  . Diabetes mellitus  . Mandibular mass  . Lung abnormality  . Hyperlipidemia  . Tobacco abuse  . Stroke   Past Medical History:  Past Medical History  Diagnosis Date  . Diabetes mellitus   . Hypertension   . Depression   . PAD (peripheral artery disease)     S/p bypass grafting, unclear exactly where  . Nephrolithiasis   . Hyperlipidemia 05/14/2011   Past Surgical History:  Past Surgical History  Procedure Date  . Total hip arthroplasty     Right  . Abdominal hysterectomy   . Cholecystectomy 1984  . Appendectomy    Social History:  reports that she has been smoking Cigarettes.  She has a 45 pack-year smoking history. She has never used smokeless tobacco. She reports that she does not drink alcohol or use illicit drugs.  Family / Support Systems Marital Status: Separated How Long?: Months Patient Roles: Parent;Other (Comment) (Friend) Spouse/Significant Other: Chelsey Gallagher- separated spouse  [phone]-Home Children: Chelsey Gallagher-daughter  [phone]-cell Other Supports: Chelsey Gallagher Anticipated Caregiver: Pt to ask friend or has the option to go to husband's home Ability/Limitations of Caregiver: Daughter works, spouse is unemployed at this time. Caregiver Availability: Other (Comment) (Coming up with a discharge plan) Family Dynamics: Pt is close with her daughter, she picks up grandson from pre-school daily.  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  She has always been self reliant and wants to remain so.  She states: " There is more than one way to accomplish things." Recent Psychosocial Issues: Other medical issues Pyschiatric History: History-Depression and takes meds for and feels they help her.  Beck Depression Screening score is 2 she is optimistic she will recover from this stroke.  Continue to monitor her coping through out stay. Substance Abuse History: Tobacco use-she realizes she needs to cut back or quit.  She does not drink or do other drugs  Patient / Family Perceptions, Expectations & Goals Pt/Family understanding of illness & functional limitations: Pt has a good understanding of her stroke and deficits.  She is motivated to improve and recover from this.  She realizes she needs to come up with a discharge plan for herself.  She needs to follow up with her family and friends. Premorbid pt/family roles/activities: Chiropodist, Friend, Retiree, [employer], etc Anticipated changes in roles/activities/participation: Plans to resume at discharge Pt/family expectations/goals: Pt states: " I want to get as good as possible, I want to be independent again."  She states: " This is for the birds."  Pt is very expressive with her thoughts and feelings.  Community Resources [employer]: None Premorbid Home Care/DME Agencies: None Transportation available at discharge: Family- Friends Resource referrals recommended: Support group (specify) (CVA Support Group)  Discharge Planning Living Arrangements: Alone Support Systems: Children;Family Games developer Resources: Media planner (  specify) Theatre manager Medicare) Financial  Resources: SSD Financial Screen Referred: No Living Expenses: Own Money Management: Patient Do you have any problems obtaining your medications?: No Home Management: Pt Patient/Family Preliminary Plans: Return home or go to spouse's home.  Discharge plan is still up in the air, currently does not have a 24 hour plan. Pt is hopeful she will not need 24 hour care. Social Work Anticipated Follow Up Needs: HH/OP;Support Group DC Planning Additional Notes/Comments: Pt will require 24 hour care, will asssit her with coming up with a plan.  Clinical Impression Pleasant very talkative female who is motivated to become independent again.  Will help her come up with a safe discharge plan. Offer support and possible peer support counselor.  Chelsey Gallagher 05/15/2011, 11:56 AM

## 2011-05-15 NOTE — Evaluation (Signed)
Occupational Therapy Assessment and Plan  Patient Details  Name: Chelsey Gallagher MRN: [id number] Date of Birth: 03/08/1950  OT Diagnosis: abnormal posture, disturbance of vision, hemiplegia affecting dominant side and muscle weakness (generalized) Rehab Potential: Rehab Potential: Good ELOS: 2-3 weeks   Today's Date: 05/15/2011 Time: 0930-1030 Time Calculation (min): 60 min  Problem List:  Patient Active Problem List  Diagnoses  . CVA (cerebral infarction)  . Hypertension  . Diabetes mellitus  . Mandibular mass  . Lung abnormality  . Hyperlipidemia  . Tobacco abuse  . Stroke    Past Medical History:  Past Medical History  Diagnosis Date  . Diabetes mellitus   . Hypertension   . Depression   . PAD (peripheral artery disease)     S/p bypass grafting, unclear exactly where  . Nephrolithiasis   . Hyperlipidemia 05/14/2011   Past Surgical History:  Past Surgical History  Procedure Date  . Total hip arthroplasty     Right  . Abdominal hysterectomy   . Cholecystectomy 1984  . Appendectomy     Assessment & Plan Clinical Impression: Patient is a 61 y.o. year old female with recent admission to the hospital on March 7 with right-sided weakness and mild slurred speech. 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 Echocardiogram with ejection fractional 70% grade 1 diastolic dysfunction. Carotid Doppler showed right 60-79% internal carotid artery stenosis. CTA head and neck showed normal origin of great vessels from aortic arch. There was noted posterior to the angle of the left mandible there was an area appearing 1.5 x 1.3 x 2.2 cm node suspicious for malignancy.  Biopsy performed March 13 and results are pending.   Patient transferred to CIR on 05/14/2011 .    Patient currently requires max with basic self-care skills secondary to abnormal tone, unbalanced muscle activation, decreased  coordination and decreased motor planning.  Prior to hospitalization, patient could complete basic and advanced ADLs with independence.  Patient will benefit from skilled intervention to decrease level of assist with basic self-care skills and increase independence with basic self-care skills prior to discharge with pt hoping a friend can live with her 24/7 to provide assistance (still being discussed).  Anticipate patient will require 24 hour supervision and follow up home health.  OT - End of Session Activity Tolerance: Tolerates 30+ min activity without fatigue OT Assessment Rehab Potential: Good Barriers to Discharge: Decreased caregiver support OT Plan OT Frequency: 1-2 X/day, 60-90 minutes Estimated Length of Stay: 2-3 weeks OT Treatment/Interventions: Balance/vestibular training;Discharge planning;DME/adaptive equipment instruction;Functional electrical stimulation;Functional mobility training;Neuromuscular re-education;Patient/family education;Self Care/advanced ADL retraining;Therapeutic Activities;Therapeutic Exercise;UE/LE Strength taining/ROM;UE/LE Coordination activities;Visual/perceptual remediation/compensation OT Recommendation Follow Up Recommendations: Home health OT Equipment Recommended: 3 in 1 bedside comode;Tub/shower seat  OT Evaluation Precautions/Restrictions  Precautions Precautions: Fall Required Braces or Orthoses: No Restrictions Weight Bearing Restrictions: No Other Position/Activity Restrictions: right hemiplegia, decreased right side sensation, Dys. 3 with thin liquids General Chart Reviewed: Yes Family/Caregiver Present: No Vital Signs Therapy Vitals Pulse Rate: 81  Patient Position, if appropriate: Sitting Oxygen Therapy SpO2: 97 % O2 Device: None (Room air) Pain Pain Assessment Pain Assessment: No/denies pain Pain Score: 0-No pain Home Living/Prior Functioning Home Living Lives With: Alone Receives Help From: Friend(s) (hopes to have friend  stay with her for 24/7 assist at d/c) Type of Home: House Home Layout: One level Home Access: Level entry Entrance Stairs-Rails: None Entrance Stairs-Number of Steps: slight threshold at entrance [employer]:  Walk-in shower;Tub/shower unit (walk-in w/ door in [employer] w/ curtain in hall bath) Bathroom Toilet: Standard Bathroom Accessibility: Yes How Accessible: Accessible via wheelchair Home Adaptive Equipment: Long-handled shoehorn;Reacher;Sock aid;Straight cane;Walker - rolling Prior Function Level of Independence: Independent with basic ADLs;Independent with homemaking with ambulation;Independent with gait;Independent with transfers Able to Take Stairs?: Yes Driving: Yes Vocation: Retired Leisure: Hobbies-yes (Comment) Comments: reading ADL ADL Grooming: Moderate assistance Where Assessed-Grooming: Sitting at sink Upper Body Bathing: Minimal assistance Where Assessed-Upper Body Bathing: Sitting at sink Lower Body Bathing: Maximal assistance Where Assessed-Lower Body Bathing: Sitting at sink;Standing at sink Upper Body Dressing: Moderate assistance Where Assessed-Upper Body Dressing: Sitting at sink Lower Body Dressing: Maximal assistance Where Assessed-Lower Body Dressing: Sitting at sink;Standing at sink Toileting: Maximal assistance Where Assessed-Toileting: Teacher, adult education: Moderate assistance Toilet Transfer Method: Stand pivot Tub/Shower Transfer: Not assessed Film/video editor: Not assessed Vision/Perception  Vision - History Baseline Vision:  (glasses for reading and distances) Visual History:  (intermittent double vision PTA) Patient Visual Report: No change from baseline Vision - Assessment Eye Alignment: Within Functional Limits Vision Assessment: Vision tested Tracking/Visual Pursuits: Decreased smoothness of horizontal tracking Saccades: Decreased speed of saccadic movement Visual Fields: No apparent deficits Additional  Comments: Pt with horizontal nystagmus during tracking. Perception Perception: Within Functional Limits  Cognition Overall Cognitive Status: Appears within functional limits for tasks assessed Arousal/Alertness: Awake/alert Orientation Level: Oriented X4 Sensation Sensation Light Touch: Impaired Detail Light Touch Impaired Details: Impaired RUE;Impaired RLE Stereognosis: Not tested Hot/Cold: Not tested Proprioception: Impaired Detail Proprioception Impaired Details: Absent RUE;Absent RLE Additional Comments: also reports numbless along right side of face, neck, and right ear Coordination Gross Motor Movements are Fluid and Coordinated: No Fine Motor Movements are Fluid and Coordinated: No Coordination and Movement Description: decreased grading and accuracy of all right side movements Finger Nose Finger Test: unable to assess due to no AROM on RUE Heel Shin Test: unable to fully assess due to decreased AROM to complete full ROM for test Motor  Motor Motor: Hemiplegia;Abnormal postural alignment and control;Abnormal tone Motor - Skilled Clinical Observations: Mild increased tone right LE with contracture developing at right ankle. Patient will benefit from [facility] boot for positioning in bed to prevent further contracture. Mobility  Bed Mobility Bed Mobility: Yes Rolling Right: 3: Mod assist Rolling Left: 2: Max assist Left Sidelying to Sit: 3: Mod assist;HOB flat Sitting - Scoot to Edge of Bed: 3: Mod assist Sitting - Scoot to [place] of Bed Details (indicate cue type and reason): poorly graded lateral weight shifts with frequent LOB posterior and laterally to right Transfers Transfers: Yes Sit to Stand: 2: Max assist Sit to Stand Details (indicate cue type and reason): increased right lateral weight shift, trunk and right LE collapse, unable to achieve right foot flat due to contracture of right ankle Stand to Sit: 2: Max assist Stand to Sit Details: right lateral weight shift with  decreased eccentric control noted resulting in no control of descent  Trunk/Postural Assessment  Cervical Assessment Cervical Assessment: Exceptions to [facility] (mild left rotation and lateral flexion) Thoracic Assessment Thoracic Assessment: Exceptions to [facility] (trunk collapsed to the right w/ decreased active elongation) Lumbar Assessment Lumbar Assessment: Exceptions to [facility] (posterior pelvic tilt with decreased dissociated movement) Postural Control Postural Control: Deficits on evaluation (balance and righting reactions absent)  Balance Balance Balance Assessed: Yes Static Sitting Balance Static Sitting - Balance Support: No upper extremity supported;Feet supported Static Sitting - Level of Assistance: 4: Min assist Dynamic Sitting Balance Dynamic Sitting -  Balance Support: No upper extremity supported;Feet supported;During functional activity Dynamic Sitting - Level of Assistance: 3: Mod assist Static Standing Balance Static Standing - Balance Support: Right upper extremity supported Static Standing - Level of Assistance: 2: Max assist Dynamic Standing Balance Dynamic Standing - Balance Support: Right upper extremity supported;During functional activity Dynamic Standing - Level of Assistance: 1: +1 Total assist Extremity/Trunk Assessment RUE Assessment RUE Assessment: Exceptions to [facility] RUE AROM (degrees) Overall AROM Right Upper Extremity: Deficits (No AROM) RUE Strength RUE Overall Strength Comments: 0/5 RUE Tone RUE Tone Comments: flaccid LUE Assessment LUE Assessment: Within Functional Limits  See FIM for current functional status Refer to Care Plan for Long Term Goals  Recommendations for other services: None  Discharge Criteria: Patient will be discharged from OT if patient refuses treatment 3 consecutive times without medical reason, if treatment goals not met, if there is a change in medical status, if patient makes no progress towards goals or if patient is discharged  from hospital.  The above assessment, treatment plan, treatment alternatives and goals were discussed and mutually agreed upon: by patient  Treatment Session: OT eval, ADL assessment at sink from w/c with focus on sit <> stand with cues for hand placement and support at Rt knee to prevent buckling.  Assessed toilet transfer with pt requiring mod assist stand pivot with placement of RLE prior to transfer.  Pt completed perineal hygiene on toilet, required assist with clothing management.    [name] 05/15/2011, 12:24 PM

## 2011-05-15 NOTE — Evaluation (Signed)
Speech Language Pathology Assessment and Plan  Patient Details  Name: Chelsey Gallagher MRN: [id number] Date of Birth: [birth year]  SLP Diagnosis: mild dysphagia; mild dysarthria Rehab Potential: Good ELOS: 2-3 weeks   Today's Date: 05/15/2011 Time: 1430-1540 Time Calculation (min): 70 min  Problem List:  Patient Active Problem List  Diagnoses  . CVA (cerebral infarction)  . Hypertension  . Diabetes mellitus  . Mandibular mass  . Lung abnormality  . Hyperlipidemia  . Tobacco abuse  . Stroke    Past Medical History:  Past Medical History  Diagnosis Date  . Diabetes mellitus   . Hypertension   . Depression   . PAD (peripheral artery disease)     S/p bypass grafting, unclear exactly where  . Nephrolithiasis   . Hyperlipidemia 05/14/2011   Past Surgical History:  Past Surgical History  Procedure Date  . Total hip arthroplasty     Right  . Abdominal hysterectomy   . Cholecystectomy 1984  . Appendectomy     Assessment & Plan Clinical Impression: 61 year old right-handed female with history of diabetes mellitus and tobacco abuse admitted March 7 with right-sided weakness and mild slurred speech. 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  Patient admitted to [facility] 05/15/11 and upon evaluated today presents with mild oral-pharyngeal dysphagia and mild dysarthria characterized by imprecise consonant productions.  Patient would benefit from skilled SLP services to maximize independence with communication and address her least restrictive p.o. Intake.    SLP - End of Session Patient left: in chair;with call bell in reach;with family/visitor present Nurse Communication: Cognitive/Linguistic strategies reviewed Assessment SLP Recommendation/Assessment: Patient will need skilled Speech Lanaguage Pathology Services during CIR admission Rehab Potential: Good Barriers to Discharge:  Decreased caregiver support Therapy Diagnosis: Dysarthria SLP Plan SLP Frequency: 1-2 X/day, 30-60 minutes;5 out of 7 days Estimated Length of Stay: 2-3 weeks SLP Treatment/Interventions: Cueing hierarchy;Environmental controls;Functional tasks;Internal/external aids;Therapeutic Activities;Therapeutic Exercise;Patient/family education Recommendation Follow up Recommendations: Home Health SLP Equipment Recommended: None recommended by SLP  SLP Evaluation Precautions/Restrictions    General    Pain Pain Assessment Pain Assessment: No/denies pain Pain Score: 0-No pain Prior Functioning Cognitive/Linguistic Baseline: Within functional limits Cognition Overall Cognitive Status: Appears within functional limits for tasks assessed Arousal/Alertness: Awake/alert Orientation Level: Oriented X4 Attention: Alternating Alternating Attention: Appears intact Memory: Impaired Memory Impairment: Decreased recall of new information (functional carryover of complex information) Awareness: Appears intact (demo anticipatory awareness with readjusting in chair) Problem Solving: Appears intact Executive Function: Self Monitoring;Self Correcting Self Monitoring: Impaired Self Correcting: Impaired Safety/Judgment: Appears intact Comments: self monitoring and correcting with new complex information with regard to dysphagia requires cues Comprehension Auditory Comprehension Overall Auditory Comprehension: Appears within functional limits for tasks assessed Visual Recognition/Discrimination Discrimination: Within Function Limits Reading Comprehension Reading Status: Within funtional limits Expression Expression Primary Mode of Expression: Verbal Verbal Expression Overall Verbal Expression: Appears within functional limits for tasks assessed Written Expression Dominant Hand: Right Written Expression: Not tested Oral/Motor Oral Motor/Sensory Function Overall Oral Motor/Sensory Function:  Impaired Labial ROM: Within Functional Limits Labial Symmetry: Abnormal symmetry right (at rest) Labial Strength: Reduced Labial Sensation: Reduced Lingual ROM: Reduced right Lingual Symmetry: Within Functional Limits Lingual Strength: Reduced Lingual Sensation: Reduced Facial ROM: Within Functional Limits Facial Symmetry: Within Functional Limits Facial Strength: Within Functional Limits Facial Sensation: Reduced Velum: Within Functional Limits Mandible: Within Functional Limits Motor Speech Overall Motor Speech: Impaired Resonance: Hyponasality Articulation: Impaired Level of Impairment: Conversation Intelligibility: Intelligibility  reduced Conversation: 75-100% accurate Motor Planning: Witnin functional limits Interfering Components: Hearing loss Effective Techniques: Over-articulate  See FIM for current functional status Refer to Care Plan for Long Term Goals  Recommendations for other services: None  Discharge Criteria: Patient will be discharged from SLP if patient refuses treatment 3 consecutive times without medical reason, if treatment goals not met, if there is a change in medical status, if patient makes no progress towards goals or if patient is discharged from hospital.  The above assessment, treatment plan, treatment alternatives and goals were discussed and mutually agreed upon: by patient and by family  Conception Doebler 05/15/2011, 4:30 PM    Clinical/Bedside Swallow Evaluation Patient Details  Name: Chelsey Gallagher MRN: [id number] DOB: [birth year] Today's Date: 05/15/2011  Past Medical History:  Past Medical History  Diagnosis Date  . Diabetes mellitus   . Hypertension   . Depression   . PAD (peripheral artery disease)     S/p bypass grafting, unclear exactly where  . Nephrolithiasis   . Hyperlipidemia 05/14/2011   Past Surgical History:  Past Surgical History  Procedure Date  . Total hip arthroplasty     Right  . Abdominal hysterectomy   .  Cholecystectomy 1984  . Appendectomy    HPI:  61 year old right-handed female with history of diabetes mellitus and tobacco abuse admitted March 7 with right-sided weakness and mild slurred speech. 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 Echocardiogram with ejection fractional 70% grade 1 diastolic dysfunction. Carotid Doppler showed right 60-79% internal carotid artery stenosis. CTA head and neck showed normal origin of great vessels from aortic arch. There was noted posterior to the angle of the left mandible there was an area appearing 1.5 x 1.3 x 2.2 cm node suspicious for malignancy.Proximal right internal carotid artery with irregular plaque ulceration. 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 4 vessel cerebral arteriiogram March 11 by intervention radiology showed occluded left ICA proximal with a delayed string sign and 65-70% stenosis right ICA proximal. Dr. Jenne Pane of ENT followup March 11 for questionable chronic noted left neck and mandible with biopsy performed March 13 and results are pending. Noted blood pressure 207/102 on admission and placed on intravenous labetalol. She did not receive TPA. Neurology follow up and placed on aspirin as well as subcutaneous lovenox. Speech therapy placed on dysphagia 3 thin liquid diet. History of diabetes mellitus with hemoglobin A1c of 10.2 currently on sliding scale insulin as well as bedtime Lantus insulin. Physical occupational therapy treatments ongoing with recommendations for physical medicine rehabilitation consult and ultimately this patient was admitted to CIR after completing her stroke workup. Patient admitted to [facility] 05/15/11 and was evaluated today.  Assessment/Recommendations/Treatment Plan SLP Assessment Clinical Impression Statement: Patient demonstrated a mild sensoi-motor oral/pharyngeal dysphagia, characterized  by righ sided oral weakness and numbness as well as decreased hyolaryngeal excursion which results in prolonged mastication of regular textures and a cough response with consecutive sips of thin liquid from the cup or straw.   Risk for Aspiration: Mild  Swallow Evaluation Recommendations Solid Consistency: Dysphagia 3 (Mechanical soft) Liquid Consistency: Thin Liquid Administration via: Cup;No straw Medication Administration: Whole meds with liquid Supervision: Patient able to self feed;Intermittent supervision to cue for compensatory strategies Compensations: Slow rate;Small sips/bites;Check for pocketing Postural Changes and/or Swallow Maneuvers: Seated upright 90 degrees Oral Care Recommendations: Oral care BID Follow up Recommendations: Home health SLP  Treatment Plan  Treatment Plan Recommendations: Therapy as outlined in treatment plan below Speech Therapy Frequency: min 5x/week Treatment Duration: 2 weeks Interventions: Aspiration precaution training;Oral motor exercises;Pharyngeal strengthening exercises;Compensatory techniques;Patient/family education;Trials of upgraded texture/liquids;Diet toleration management by SLP;Group dysphagia treatment  Prognosis Prognosis for Safe Diet Advancement: Good  Individuals Consulted Consulted and Agree with Results and Recommendations: Patient;Family member/caregiver Family Member Consulted: daughter  Swallowing Goals  SLP Swallowing Goals Patient will consume recommended diet without observed clinical signs of aspiration with: Supervision/safety Patient will utilize recommended strategies during swallow to increase swallowing safety with: Supervision/safety  Swallow Study Prior Functional Status   Patient reports consuming regular textures an thin liquids with occasional coughing episodes.   General  Date of Onset: 05/08/11 HPI: 61 year old right-handed female with history of diabetes mellitus and tobacco abuse admitted March 7 with  right-sided weakness and mild slurred speech. 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 Echocardiogram with ejection fractional 70% grade 1 diastolic dysfunction. Carotid Doppler showed right 60-79% internal carotid artery stenosis. CTA head and neck showed normal origin of great vessels from aortic arch. There was noted posterior to the angle of the left mandible there was an area appearing 1.5 x 1.3 x 2.2 cm node suspicious for malignancy.Proximal right internal carotid artery with irregular plaque ulceration. 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 4 vessel cerebral arteriiogram March 11 by intervention radiology showed occluded left ICA proximal with a delayed string sign and 65-70% stenosis right ICA proximal .Dr. Jenne Pane of ENT followup March 11 for questionable chronic noted left neck and mandible with biopsy performed March 13 and results are pending. Noted blood pressure 207/102 on admission and placed on intravenous labetalol. She did not receive TPA. Neurology follow up and placed on aspirin as well as subcutaneous lovenox. Speech therapy placed on dysphagia 3 thin liquid diet. History of diabetes mellitus with hemoglobin A1c of 10.2 currently on sliding scale insulin as well as bedtime Lantus insulin. Physical occupational therapy treatments ongoing with recommendations for physical medicine rehabilitation consult and ultimately this patient was admitted to CIR after completing her stroke workup. Type of Study: Bedside swallow evaluation Diet Prior to this Study: Dysphagia 3 (soft);Thin liquids Temperature Spikes Noted: No Respiratory Status: Room air History of Intubation: No Behavior/Cognition: Alert;Cooperative Oral Cavity - Dentition: Adequate natural dentition Vision: Functional for self-feeding Patient Positioning: Upright in chair Baseline Vocal Quality:  Clear Volitional Cough: Strong Volitional Swallow: Able to elicit  Oral Motor/Sensory Function  Overall Oral Motor/Sensory Function: Impaired Labial ROM: Within Functional Limits Labial Symmetry: Abnormal symmetry right (at rest) Labial Strength: Reduced Labial Sensation: Reduced Lingual ROM: Reduced right Lingual Symmetry: Within Functional Limits Lingual Strength: Reduced Lingual Sensation: Reduced Facial ROM: Within Functional Limits Facial Symmetry: Within Functional Limits Facial Strength: Within Functional Limits Facial Sensation: Reduced Velum: Within Functional Limits Mandible: Within Functional Limits  Consistency Results  Ice Chips Ice chips: Not tested  Thin Liquid Thin Liquid: Impaired Presentation: Cup;Self Fed;Straw Oral Phase Functional Implications: Right anterior spillage Pharyngeal  Phase Impairments: Cough - Immediate;Multiple swallows;Wet Vocal Quality Other Comments: oral phase deficits with cup and pharyngeal deficits with straw  Nectar Thick Liquid Nectar Thick Liquid: Not tested  Honey Thick Liquid Honey Thick Liquid: Not tested  Puree Puree: Not tested  Solid Solid: Impaired Presentation: Self Fed Oral Phase Functional Implications: Oral residue Pharyngeal Phase Impairments: Multiple swallows Other Comments: thin liquids via cup faciliatated decreasing oral residue;  however, due to quick rate of consecutive sips resulted in a cough   Fae Pippin, M.A., CCC-SLP [phone]

## 2011-05-15 NOTE — Progress Notes (Signed)
Physical Therapy Session Note  Patient Details  Name: Dalton Mille MRN: [id number] Date of Birth: 10-02-1950  Today's Date: 05/15/2011 Time: 1131-1202 Time Calculation (min): 31 min  Short Term Goals: Week 1:  PT Short Term Goal 1 (Week 1): Patient will perform bed mobility with min assist demonstrating typical movement patterns. PT Short Term Goal 1 - Progress (Week 1): Progressing toward goal PT Short Term Goal 2 (Week 1): Patient will consistently transfer with mod assist. PT Short Term Goal 2 - Progress (Week 1): Progressing toward goal PT Short Term Goal 3 (Week 1): Patient will maintain dynamic sitting balance > 10 min with supervision during functional activity. PT Short Term Goal 3 - Progress (Week 1): Progressing toward goal PT Short Term Goal 4 (Week 1): Patient will maintain dynamic standing balance > 10 min with mod assist for dynamic pre-gait training. PT Short Term Goal 4 - Progress (Week 1): Progressing toward goal PT Short Term Goal 5 (Week 1): Patient will propel w/c > 100 feet with min assist in controlled environment, PT Short Term Goal 5 - Progress (Week 1): Progressing toward goal  Skilled Therapeutic Interventions/Progress Updates:   Therapy Documentation Precautions:  Precautions Precautions: Fall Required Braces or Orthoses: No Restrictions Weight Bearing Restrictions: No Other Position/Activity Restrictions: right hemiplegia, decreased right side sensation, Dys. 3 with thin liquids General: Chart Reviewed: Yes Family/Caregiver Present: No  Treatments:  w/c mobility training using left hemi-technique min assist x 25 feet. W/C currently too tall for patient to effectively self-propel. Changed cushion, however w/c still too tall. Will follow-up and locate hemi-height w/c to further increase functional independence.  Squat-pivot transfers left and right mod assist, focus on sequencing, right foot placement, and anterior weight shift for increased safety  and functional independence. Right ankle stretches with calcaneal distraction and mobilization to increase ROM followed by sit-stand x 3 with mod assist and manual facilitation for right LE alignment, anterior weight shift, and symmetrical weight bearing. Standing dynamic pre-gait activities with focus on right trunk elongation, right hip and knee extension, and midline weight bearing progressing to lateral weight shifts with left heel lift for right stance components mod assist. Right stance components poorly sustained at this time resulting in frequent right LOB in weight bearing positions.  See FIM for current functional status  Therapy/Group: Individual Therapy  Romeo Rabon 05/15/2011, 12:09 PM

## 2011-05-15 NOTE — Evaluation (Signed)
Physical Therapy Assessment and Plan  Patient Details  Name: Tinaya Ceballos MRN: [id number] Date of Birth: 23-May-1950  PT Diagnosis: Abnormal posture, Abnormality of gait, Hemiplegia dominant, Hypertonia, Impaired sensation and Muscle weakness Rehab Potential: Good ELOS: 2-3 weeks   Today's Date: 05/15/2011 Time: 4098-1191 Time Calculation (min): 57 min  Problem List:  Patient Active Problem List  Diagnoses  . CVA (cerebral infarction)  . Hypertension  . Diabetes mellitus  . Mandibular mass  . Lung abnormality  . Hyperlipidemia  . Tobacco abuse  . Stroke    Past Medical History:  Past Medical History  Diagnosis Date  . Diabetes mellitus   . Hypertension   . Depression   . PAD (peripheral artery disease)     S/p bypass grafting, unclear exactly where  . Nephrolithiasis   . Hyperlipidemia 05/14/2011   Past Surgical History:  Past Surgical History  Procedure Date  . Total hip arthroplasty     Right  . Abdominal hysterectomy   . Cholecystectomy 1984  . Appendectomy     Assessment & Plan Clinical Impression:  Patient is a 61 year old right-handed female with history of diabetes mellitus and tobacco abuse admitted 05/08/11 with right-sided weakness and mild slurred speech. 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 Echocardiogram with ejection fractional 70% grade 1 diastolic dysfunction. Carotid Doppler showed right 60-79% internal carotid artery stenosis. CTA head and neck showed normal origin of great vessels from aortic arch. Also noted posterior to the angle of the left mandible there was an area appearing 1.5 x 1.3 x 2.2 cm node suspicious for malignancy. Biopsy performed 05/12/11 with results pending. Proximal right internal carotid artery with irregular plaque ulceration. 76% diameter stenosis .CT of head notes left vertebral artery is occluded. Four-vessel cerebral  arteriiogram 05/12/11 showed occluded left ICA proximal with a delayed string sign and 65-70% stenosis right ICA proximal. Patient was admitted to [facility] 05/14/11 for continued therapies prior to discharge home.  PTA patient lived alone in a single-level home with level entry. Patient was independent with all mobility with no AD, was driving, and enjoyed reading and spending time with her 74-year-old grandson. Patient reports she is currently working on having a friend come stay with her to provide 24/7 assist at discharge, however this in not currently in place.  Patient currently requires mod assist for transfers and total assist for all standing pre-gait activities secondary to generalized muscle weakness, decreased LE power and eccentric control right > left, decreased muscular and cardiovascular endurance, dominant right side hemiplegia, decreased sustained right sided activation, right ankle contracture preventing right foot flat on floor, decreased right side sensation and proprioception, delayed balance and righting reactions with frequent LOB posterior and right, decreased graded weight shifts in all planes, and overall decreased activity tolerance resulting in decreased functional independence.    Patient will benefit from skilled PT intervention to maximize safe functional mobility, minimize fall risk and decrease caregiver burden for planned discharge home with 24 hour assist.  Anticipate patient will benefit from follow up [facility] at discharge.  PT - End of Session Activity Tolerance: Tolerates 30+ min activity with multiple rests PT Assessment Rehab Potential: Good Barriers to Discharge: Decreased caregiver support PT Plan PT Frequency: 1-2 X/day, 60-90 minutes Estimated Length of Stay: 2-3 weeks PT Treatment/Interventions: Ambulation/gait training;Balance/vestibular training;Cognitive remediation/compensation;Community reintegration;Discharge planning;DME/adaptive equipment  instruction;Functional mobility training;Psychosocial support;Patient/family education;Neuromuscular re-education;Splinting/orthotics;Stair training;Therapeutic Activities;UE/LE Coordination activities;UE/LE Strength taining/ROM;Therapeutic Exercise;Visual/perceptual remediation/compensation;Wheelchair propulsion/positioning PT  Recommendation Follow Up Recommendations: Home health PT Equipment Recommended: Wheelchair (measurements);Wheelchair cushion (measurements) Equipment Details: 16x16 hemi height, jay basic cushion and back, right half lap tray  PT Evaluation Precautions/Restrictions Precautions Precautions: Fall Required Braces or Orthoses: No Restrictions Weight Bearing Restrictions: No Other Position/Activity Restrictions: right hemiplegia, decreased right side sensation, Dys. 3 with thin liquids General Chart reviewed, no family present Vital Signs Therapy Vitals Pulse Rate: 81  Patient Position, if appropriate: Sitting Oxygen Therapy SpO2: 97 % O2 Device: None (Room air) Patient O2 saturation was 98% on 2L O2 via nasal cannula. Removed and monitored throughout session on room air, saturation consistently 97% and greater. Will continue to monitor during mobility.  Pain Pain Assessment Pain Assessment: No/denies pain Pain Score: 0-No pain Home Living/Prior Functioning Home Living Lives With: Alone Receives Help From: Friend(s) (hopes to have friend stay with her for 24/7 assist at d/c) Type of Home: House Home Layout: One level Home Access: Level entry Bathroom Shower/Tub: Walk-in shower;Door (in Child psychotherapist, also has tub/shower with curtain in hall bath) Bathroom Toilet: Standard Bathroom Accessibility: Yes How Accessible: Accessible via walker Home Adaptive Equipment: Walker - rolling;Straight cane;Long-handled shoehorn;Reacher;Sock aid Prior Function Level of Independence: Independent with basic ADLs;Independent with homemaking with ambulation;Independent with  gait;Independent with transfers Able to Take Stairs?: Yes Driving: Yes Vocation: Retired Leisure: Hobbies-yes (Comment) Comments: enjoys reading Vision/Perception  Vision - History Baseline Vision: Other (comment) (glasses for reading and distances) Visual History: Other (comment) (intermittent double vision PTA) Patient Visual Report: No change from baseline Perception Perception: Within Functional Limits  Cognition Overall Cognitive Status: Appears within functional limits for tasks assessed Arousal/Alertness: Awake/alert Orientation Level: Oriented X4 Decreased safety awareness emergently during mobility.  Sensation Sensation Light Touch: Impaired Detail Light Touch Impaired Details: Impaired RLE Stereognosis: Not tested Hot/Cold: Not tested Proprioception: Impaired Detail Proprioception Impaired Details: Absent RLE Additional Comments: also reports numbless along right side of face, neck, and right ear Coordination Gross Motor Movements are Fluid and Coordinated: No Fine Motor Movements are Fluid and Coordinated: No Coordination and Movement Description: decreased grading and accuracy of all right side movements Heel Shin Test: unable to fully assess due to decreased AROM to complete full ROM for test Motor  Motor Motor: Hemiplegia;Abnormal postural alignment and control;Abnormal tone Motor - Skilled Clinical Observations: Mild increased tone right LE with contracture developing at right ankle. Patient will benefit from [facility] boot for positioning in bed to prevent further contracture.  Mobility Bed Mobility Bed Mobility: Yes Rolling Right: 3: Mod assist Rolling Left: 2: Max assist Left Sidelying to Sit: 3: Mod assist;HOB flat Sitting - Scoot to Edge of Bed: 3: Mod assist Sitting - Scoot to [employer] of Bed Details (indicate cue type and reason): poorly graded lateral weight shifts with frequent LOB posterior and laterally to right Transfers Transfers: Yes Sit to Stand: 2:  Max assist Sit to Stand Details (indicate cue type and reason): increased right lateral weight shift, trunk and right LE collapse, unable to achieve right foot flat due to contracture of right ankle Stand to Sit: 2: Max assist Stand to Sit Details: right lateral weight shift with decreased eccentric control noted resulting in no control of descent Squat Pivot Transfers: 3: Mod assist;2: Max Designer, industrial/product Details (indicate cue type and reason): mod assist to the left, max assist to the right. Very poor control and grading of lateral weight shifts right > left. Locomotion  Ambulation Ambulation/Gait Assistance: 1: +1 Total assist Ambulation Distance (Feet): 3 Feet Assistive device:  1 person hand held assist Ambulation/Gait Assistance Details (indicate cue type and reason): poor sustained right stance components, right trunk and LE collapse in weight bearing without facilitation. Right foot not flat on floor due to ankle contracture. Stairs / Additional Locomotion Stairs: No (unsafe to attempt at this time) Naval architect Mobility: No  Trunk/Postural Assessment  Cervical Assessment Cervical Assessment: Exceptions to [facility] (mild left rotation and lateral flexion) Thoracic Assessment Thoracic Assessment: Exceptions to [facility] (trunk collapsed to the right w/ decreased active elongation) Lumbar Assessment Lumbar Assessment: Exceptions to [facility] (posterior pelvic tilt with decreased dissociated movement) Postural Control Postural Control: Deficits on evaluation (balance and righting reactions absent)  Balance Balance Balance Assessed: Yes Static Sitting Balance Static Sitting - Balance Support: No upper extremity supported;Feet supported Static Sitting - Level of Assistance: 4: Min assist Dynamic Sitting Balance Dynamic Sitting - Balance Support: No upper extremity supported;Feet supported;During functional activity Dynamic Sitting - Level of Assistance: 3: Mod  assist Static Standing Balance Static Standing - Balance Support: Right upper extremity supported Static Standing - Level of Assistance: 2: Max assist Dynamic Standing Balance Dynamic Standing - Balance Support: Right upper extremity supported;During functional activity Dynamic Standing - Level of Assistance: 1: +1 Total assist Extremity Assessment      RLE Assessment RLE Assessment: Exceptions to WFL (PROM WNL except ankle contracture, strength grossly 2/5.) LLE Assessment LLE Assessment: Within Functional Limits (AROM WNL, strength grossly 4/5.)  Treatment Initiated During Session: right ankle stretches for improved right LE ROM. Patient will benefit from right PRAFO to prevent further contracture at this time Dynamic sitting balance activities with emphasis on graded weight shifts, right trunk alignment and sustained right elongation followed by sit to squat to stand, manual facilitation for right LE alignment and ankle position, midline weight bearing, and controlled weight shifts. Standing pre-gait activities with emphasis on right sided elongation and sustained stance components followed by gait x 3 feet and 5 feet with right hand held assist max-total assist. Manual facilitation for graded lateral weight shifts, right LE and trunk alignment, and sustained stance components.  See FIM for current functional status Refer to Care Plan for Long Term Goals  Recommendations for other services: None  Discharge Criteria: Patient will be discharged from PT if patient refuses treatment 3 consecutive times without medical reason, if treatment goals not met, if there is a change in medical status, if patient makes no progress towards goals or if patient is discharged from hospital.  The above assessment, treatment plan, treatment alternatives and goals were discussed and mutually agreed upon: by patient  Romeo Rabon 05/15/2011, 9:58 AM

## 2011-05-15 NOTE — Progress Notes (Signed)
Patient information reviewed and entered into [employer] system by Laityn Bensen, RN, CRRN, PPS Coordinator.  Information including medical coding and functional independence measure will be reviewed and updated through discharge.    

## 2011-05-15 NOTE — Progress Notes (Signed)
INITIAL ADULT NUTRITION ASSESSMENT Date: 05/15/2011   Time: 2:58 PM  Reason for Assessment: MD consult  ASSESSMENT: Female 61 y.o.  Dx: Stroke  Hx:  Past Medical History  Diagnosis Date  . Diabetes mellitus   . Hypertension   . Depression   . PAD (peripheral artery disease)     S/p bypass grafting, unclear exactly where  . Nephrolithiasis   . Hyperlipidemia 05/14/2011   Related Meds:     . aspirin  81 mg Oral Daily  . atorvastatin  40 mg Oral q1800  . citalopram  20 mg Oral Daily  . enoxaparin  40 mg Subcutaneous Q24H  . gabapentin  600 mg Oral QHS  . insulin aspart  0-15 Units Subcutaneous TID WC  . insulin glargine  15 Units Subcutaneous QHS  . lisinopril  40 mg Oral Daily  . nicotine  21 mg Transdermal Daily  . rOPINIRole  3 mg Oral QHS  . senna  1 tablet Oral BID  . sorbitol  30 mL Oral NOW  . DISCONTD: aspirin  81 mg Oral Daily  . DISCONTD: citalopram  20 mg Oral Daily  . DISCONTD: enoxaparin  40 mg Subcutaneous Q24H  . DISCONTD: lisinopril  40 mg Oral Daily  . DISCONTD: senna  1 tablet Oral BID   Ht: 5\' 3"  (160 cm)  Wt:  147 lb 14.4 oz (67.1 kg)  Ideal Wt: 52.3 kg % Ideal Wt: 128%  Usual Wt: 150 lb (per pt) % Usual Wt: 98%  BMI is 26.2. Pt is overweight.  Food/Nutrition Related Hx: normal appetite PTA; no weight changes  Labs:  CMP     Component Value Date/Time   NA 135 05/15/2011 0650   K 4.1 05/15/2011 0650   CL 97 05/15/2011 0650   CO2 31 05/15/2011 0650   GLUCOSE 141* 05/15/2011 0650   BUN 14 05/15/2011 0650   CREATININE 0.56 05/15/2011 0650   CALCIUM 10.0 05/15/2011 0650   PROT 6.9 05/15/2011 0650   ALBUMIN 3.4* 05/15/2011 0650   AST 27 05/15/2011 0650   ALT 27 05/15/2011 0650   ALKPHOS 95 05/15/2011 0650   BILITOT 0.7 05/15/2011 0650   GFRNONAA >90 05/15/2011 0650   GFRAA >90 05/15/2011 0650   CBG (last 3)   Basename 05/15/11 1209 05/15/11 0719 05/14/11 2039  GLUCAP 164* 151* 180*   Lab Results  Component Value Date   HGBA1C 10.2*  05/08/2011   Intake/Output I/O last 3 completed shifts: In: 240 [P.O.:240] Out: -     Diet Order: Dysphagia 3 with thin liquids  Supplements/Tube Feeding: none  IVF:    sodium chloride   Estimated Nutritional Needs:   Kcal: 1550 - 1800 kcal Protein:  80 - 90 grams Fluid:  1.6 - 1.8 L/d  Pt admitted s/p stroke. RD consulted to see patient for DM education. Noted elevated HgbA1c of 10.2. Pt states appetite has been poor. Denies weight loss, but diet recall reveals reduced PO intake intermittently. Pt also states that baseline intake is minimal. Prefers to snack and will consume several processed foods such as cheetos. Also drinks sweetened tea throughout the day.   Discussed importance of not skipping meals and avoiding sugar sweetened beverages in efforts to maintain better blood sugar control.  Stage I pressure ulcer. Pt agreeable to trying protein supplement and Ensure pudding. Does not like Glucerna shakes.  NUTRITION DIAGNOSIS: 1. Inadequate oral intake r/t poor appetite AEB pt report. 2. Food- and nutrition- related knowledge deficit r/t limited prior diet  education regarding DM AEB pt report and HgbA1c of 10.2.  MONITORING/EVALUATION(Goals): Goal:  1. Pt to consume >/= 75% of meals and supplements. 2. Verbalize basic understanding of DM diet. Goal met. Monitor:  Weights, labs, PO intake, I/O's  EDUCATION NEEDS: -Education needs addressed  INTERVENTION: 1. Provided pt with CHO Counting information and reviewed basics of DM diet. 2. Add 30 ml Prostat daily 3. Add Ensure Pudding PO daily 4. RD to follow nutrition care plan  Dietitian #: [phone]  DOCUMENTATION CODES Per approved criteria  -Not Applicable    Adair Laundry 05/15/2011, 2:58 PM

## 2011-05-15 NOTE — Progress Notes (Signed)
Patient ID: Chelsey Gallagher, female   DOB: 04-08-1950, 61 y.o.   MRN: [id number]   Subjective/Complaints: Lymph node biopsy neg.  Post procedure scan neg for hematoma Sm amt bowel inc last noc Objective: Vital Signs: Blood pressure 164/76, pulse 61, temperature 97.8 F (36.6 C), temperature source Oral, resp. rate 18, height 5\' 3"  (1.6 m), SpO2 97.00%. US Biopsy  05/14/2011  *RADIOLOGY REPORT*  Indication: Necrotic left-sided cervical lymph nodes as seen on prior cervical CTA.  ULTRASOUND GUIDED LEFT CERVICAL LYMPH NODE BIOPSY  Comparisons: Cervical CTA - 05/09/2011  Medications: None  Complications: None immediate  Findings / Technique:  Informed written consent was obtained from the patient after a discussion of the risks, benefits and alternatives to treatment. Questions regarding the procedure were encouraged and answered. Initial ultrasound scanning demonstrated an enlarged hypoechoic cervical lymph node within the high left neck at the level of the left mandibular angle as was demonstrated on prior cervical CTA. The node measures approximately 1.5 cm in greatest dimension.  The procedure was planned.  A timeout was performed prior to the initiation of the procedure.  The operative was prepped and draped in the usual sterile fashion, and a sterile drape was applied covering the operative field.  A timeout was performed prior to the initiation of the procedure. Local anesthesia was provided with 1% lidocaine with epinephrine.  Under direct ultrasound guidance fine needle aspiration were obtained with a 22 gauge Inrad needle.  Samples were prepared by the cytotechnologists and submitted to pathology. Given the small size and location of the node, a core needle biopsy was not obtained.  Post procedure scan was negative for hematoma.  A dressing was placed.  The patient tolerated the procedure well without immediate postprocedural complication.  Impression:  Successful ultrasound guided fine needle  aspiration of enlarged, likely necrotic left cervical lymph node.  Original Report Authenticated By: Waynard Reeds, M.D.   Results for orders placed during the hospital encounter of 05/14/11 (from the past 72 hour(s))  BASIC METABOLIC PANEL     Status: Abnormal   Collection Time   05/14/11  4:28 PM      Component Value Range Comment   Sodium 131 (*) 135 - 145 (mEq/L)    Potassium 3.8  3.5 - 5.1 (mEq/L)    Chloride 94 (*) 96 - 112 (mEq/L)    CO2 29  19 - 32 (mEq/L)    Glucose, Bld 141 (*) 70 - 99 (mg/dL)    BUN 15  6 - 23 (mg/dL)    Creatinine, Ser 4.09 (*) 0.50 - 1.10 (mg/dL)    Calcium 81.1  8.4 - 10.5 (mg/dL)    GFR calc non Af Amer >90  >90 (mL/min)    GFR calc Af Amer >90  >90 (mL/min)   GLUCOSE, CAPILLARY     Status: Abnormal   Collection Time   05/14/11  4:34 PM      Component Value Range Comment   Glucose-Capillary 160 (*) 70 - 99 (mg/dL)    Comment 1 Notify RN     GLUCOSE, CAPILLARY     Status: Abnormal   Collection Time   05/14/11  8:39 PM      Component Value Range Comment   Glucose-Capillary 180 (*) 70 - 99 (mg/dL)    Comment 1 Notify RN      hysical Exam  Vitals reviewed.  Constitutional: She is oriented to person, place, and time. She appears well-developed.  HENT:  Head: Normocephalic. PERRL,  EOMI  Neck: Normal range of motion. Neck supple. No thyromegaly present.  Cardiovascular: Normal rate and regular rhythm.  Pulmonary/Chest: Effort normal and breath sounds normal. She has no wheezes.  Abdominal: Soft. She exhibits no distension. There is no tenderness.  Musculoskeletal: She exhibits no edema.  Neurological: She is alert and oriented to person, place, and time.  She is somewhat hard of hearing (Right more affected than left). She wears hearing aids on either ear. Shows good awareness of deficits. Follow three-step commands.Severely Dysarthric speech. Gaze intact. Poor visual acuity overall. Right facial weakness and sensory loss with tongue deviation. A few  beats of nystagmus with superior gaze and left gaze. RUE 0'/5. RLE 2+ prox to 0/trace distally with ADF and 1+/5 APF. Left side motor and sensory nromal. Sensation 1/2 on right (feels pins and needles). She can sense pain however.  Skin: Skin is warm and dry.  Psychiatric: She has a normal mood and affect.  Mild anxiety  Assessment/Plan: 1. 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 Physiatrist is providing close team supervision and 24 hour management of active medical problems listed below. Physiatrist and rehab team continue to assess barriers to discharge/monitor patient progress toward functional and medical goals. FIM:       FIM - Toileting Toileting steps completed by patient: Adjust clothing prior to toileting;Adjust clothing after toileting;Performs perineal hygiene Toileting Assistive Devices: Grab bar or rail for support Toileting: 1: Total-Patient completed zero steps, helper did all 3  FIM - Archivist Transfers: 3-From toilet/BSC: Mod A (lift or lower assist)  FIM - Bed/Chair Transfer Bed/Chair Transfer: 3: Sit > Supine: Mod A (lifting assist/Pt. 50-74%/lift 2 legs);3: Chair or W/C > Bed: Mod A (lift or lower assist)     Comprehension Comprehension Mode: Visual Comprehension: 5-Follows basic conversation/direction: With extra time/assistive device  Expression Expression Mode: Verbal Expression: 5-Expresses basic needs/ideas: With extra time/assistive device  Social Interaction Social Interaction: 7-Interacts appropriately with others - No medications needed.  Problem Solving Problem Solving: 5-Solves basic problems: With no assist  Memory Memory: 5-Recognizes or recalls 90% of the time/requires cueing < 10% of the time   Medical Problem List and Plan:  1. Left medullary and cerebellar infarct  2. DVT  Prophylaxis/Anticoagulation: Subcutaneous lovenox. Monitor platelet counts and any signs of bleeding  3. Left neck mandible mass. Await biopsy today  4. 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 Plan outpatient followup for revascularization with intervention radiology  5. Diabetes mellitus. Hemoglobin A1c of 10.2. Lantus insulin 15 units at bedtime. Check blood sugars a.c. and at bedtime. Patient on glucophage 1000 mg twice daily prior to admission  6. Hypertension. Lisinopril 40 mg daily. Monitor with increased activity  7. Tobacco abuse. Nicoderm patch.  8. Restless leg syndrome. Requip each bedtime  9. Depression.celexa. Provide emotional support and positive reinforcement  10. Hyperlipidemia. Lipitor  P   LOS (Days) 1 A FACE TO FACE EVALUATION WAS PERFORMED  Mason Burleigh E 05/15/2011, 7:23 AM

## 2011-05-15 NOTE — Evaluation (Signed)
Recreational Therapy Assessment and Plan  Patient Details  Name: Chelsey Gallagher MRN: [id number] Date of Birth: [birth year] Today's Date: 05/15/2011  Rehab Potential: Good ELOS: 2-3 weeks   Assessment Clinical Impression: Patient is a 60 year old right-handed female with history of diabetes mellitus and tobacco abuse admitted 05/08/11 with right-sided weakness and mild slurred speech. 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 Echocardiogram with ejection fractional 70% grade 1 diastolic dysfunction. Carotid Doppler showed right 60-79% internal carotid artery stenosis. CTA head and neck showed normal origin of great vessels from aortic arch. Also noted posterior to the angle of the left mandible there was an area appearing 1.5 x 1.3 x 2.2 cm node suspicious for malignancy. Biopsy performed 05/12/11 with results pending. Proximal right internal carotid artery with irregular plaque ulceration. 76% diameter stenosis .CT of head notes left vertebral artery is occluded. Four-vessel cerebral arteriiogram 05/12/11 showed occluded left ICA proximal with a delayed string sign and 65-70% stenosis right ICA proximal. Patient was admitted to [facility] 05/14/11 for continued therapies prior to discharge home.   Pt presents with decreased activity tolerance, decreased functional mobility, decreased balance, right sided weakness, limiting pt's independence with leisure/community pursuits. Leisure History/Participation Premorbid leisure interest/current participation: Games - Word-search;Games - Cross-word;Games - Cards;Nature - Other (Comment);Community - Public relations account executive;Ashby Dawes - [employer] (feeding birds) Expression Interests: Singing;Music (Comment) (i like to sing, but no one lies to hear me.  all types) Other Leisure Interests: Computer;Reading;Cooking/Baking Photographer on computer) Leisure  Participation Style: With Family/Friends Awareness of Community Resources: Good-identify 3 post discharge leisure resources Psychosocial / Spiritual Does patient have pets?: No Social interaction - Mood/Behavior: Cooperative Film/video editor for Education?: Yes Recreational Therapy Orientation Orientation -Reviewed with patient: Available activity resources Strengths/Weaknesses Patient Strengths/Abilities: Willingness to participate;Active premorbidly Patient weaknesses: Physical limitations  Plan Rec Therapy Plan Is patient appropriate for Therapeutic Recreation?: Yes Rehab Potential: Good Treatment times per week: min 1 time per week >20 minutes Estimated Length of Stay: 2-3 weeks TR Treatment/Interventions: Adaptive equipment instruction;1:1 session;Balance/vestibular training;Community reintegration;Functional mobility training;Patient/family education;Recreation/leisure participation;Therapeutic activities;UE/LE Coordination activities;Wheelchair propulsion/positioning  Recommendations for other services: None  Discharge Criteria: Patient will be discharged from TR if patient refuses treatment 3 consecutive times without medical reason.  If treatment goals not met, if there is a change in medical status, if patient makes no progress towards goals or if patient is discharged from hospital.  The above assessment, treatment plan, treatment alternatives and goals were discussed and mutually agreed upon: by patient  Amar Keenum 05/15/2011, 1:20 PM

## 2011-05-16 LAB — GLUCOSE, CAPILLARY

## 2011-05-16 NOTE — Progress Notes (Signed)
Patient ID: Chelsey Gallagher, female   DOB: 1950/10/28, 61 y.o.   MRN: [id number]   Subjective/Complaints: o2 at noc.  R ankle getting tight Objective: Vital Signs: Blood pressure 144/74, pulse 86, temperature 97.9 F (36.6 C), temperature source Oral, resp. rate 20, height 5\' 3"  (1.6 m), SpO2 99.00%. US Biopsy  05/14/2011  *RADIOLOGY REPORT*  Indication: Necrotic left-sided cervical lymph nodes as seen on prior cervical CTA.  ULTRASOUND GUIDED LEFT CERVICAL LYMPH NODE BIOPSY  Comparisons: Cervical CTA - 05/09/2011  Medications: None  Complications: None immediate  Findings / Technique:  Informed written consent was obtained from the patient after a discussion of the risks, benefits and alternatives to treatment. Questions regarding the procedure were encouraged and answered. Initial ultrasound scanning demonstrated an enlarged hypoechoic cervical lymph node within the high left neck at the level of the left mandibular angle as was demonstrated on prior cervical CTA. The node measures approximately 1.5 cm in greatest dimension.  The procedure was planned.  A timeout was performed prior to the initiation of the procedure.  The operative was prepped and draped in the usual sterile fashion, and a sterile drape was applied covering the operative field.  A timeout was performed prior to the initiation of the procedure. Local anesthesia was provided with 1% lidocaine with epinephrine.  Under direct ultrasound guidance fine needle aspiration were obtained with a 22 gauge Inrad needle.  Samples were prepared by the cytotechnologists and submitted to pathology. Given the small size and location of the node, a core needle biopsy was not obtained.  Post procedure scan was negative for hematoma.  A dressing was placed.  The patient tolerated the procedure well without immediate postprocedural complication.  Impression:  Successful ultrasound guided fine needle aspiration of enlarged, likely necrotic left cervical  lymph node.  Original Report Authenticated By: Waynard Reeds, M.D.   Results for orders placed during the hospital encounter of 05/14/11 (from the past 72 hour(s))  BASIC METABOLIC PANEL     Status: Abnormal   Collection Time   05/14/11  4:28 PM      Component Value Range Comment   Sodium 131 (*) 135 - 145 (mEq/L)    Potassium 3.8  3.5 - 5.1 (mEq/L)    Chloride 94 (*) 96 - 112 (mEq/L)    CO2 29  19 - 32 (mEq/L)    Glucose, Bld 141 (*) 70 - 99 (mg/dL)    BUN 15  6 - 23 (mg/dL)    Creatinine, Ser 4.09 (*) 0.50 - 1.10 (mg/dL)    Calcium 81.1  8.4 - 10.5 (mg/dL)    GFR calc non Af Amer >90  >90 (mL/min)    GFR calc Af Amer >90  >90 (mL/min)   GLUCOSE, CAPILLARY     Status: Abnormal   Collection Time   05/14/11  4:34 PM      Component Value Range Comment   Glucose-Capillary 160 (*) 70 - 99 (mg/dL)    Comment 1 Notify RN     GLUCOSE, CAPILLARY     Status: Abnormal   Collection Time   05/14/11  8:39 PM      Component Value Range Comment   Glucose-Capillary 180 (*) 70 - 99 (mg/dL)    Comment 1 Notify RN     CBC     Status: Abnormal   Collection Time   05/15/11  6:50 AM      Component Value Range Comment   WBC 7.3  4.0 - 10.5 (  K/uL)    RBC 5.22 (*) 3.87 - 5.11 (MIL/uL)    Hemoglobin 16.6 (*) 12.0 - 15.0 (g/dL)    HCT 16.1 (*) 09.6 - 46.0 (%)    MCV 91.2  78.0 - 100.0 (fL)    MCH 31.8  26.0 - 34.0 (pg)    MCHC 34.9  30.0 - 36.0 (g/dL)    RDW 04.5  40.9 - 81.1 (%)    Platelets 159  150 - 400 (K/uL)   COMPREHENSIVE METABOLIC PANEL     Status: Abnormal   Collection Time   05/15/11  6:50 AM      Component Value Range Comment   Sodium 135  135 - 145 (mEq/L)    Potassium 4.1  3.5 - 5.1 (mEq/L)    Chloride 97  96 - 112 (mEq/L)    CO2 31  19 - 32 (mEq/L)    Glucose, Bld 141 (*) 70 - 99 (mg/dL)    BUN 14  6 - 23 (mg/dL)    Creatinine, Ser 9.14  0.50 - 1.10 (mg/dL)    Calcium 78.2  8.4 - 10.5 (mg/dL)    Total Protein 6.9  6.0 - 8.3 (g/dL)    Albumin 3.4 (*) 3.5 - 5.2 (g/dL)    AST 27   0 - 37 (U/L)    ALT 27  0 - 35 (U/L)    Alkaline Phosphatase 95  39 - 117 (U/L)    Total Bilirubin 0.7  0.3 - 1.2 (mg/dL)    GFR calc non Af Amer >90  >90 (mL/min)    GFR calc Af Amer >90  >90 (mL/min)   DIFFERENTIAL     Status: Normal   Collection Time   05/15/11  6:50 AM      Component Value Range Comment   Neutrophils Relative 63  43 - 77 (%)    Neutro Abs 4.6  1.7 - 7.7 (K/uL)    Lymphocytes Relative 25  12 - 46 (%)    Lymphs Abs 1.8  0.7 - 4.0 (K/uL)    Monocytes Relative 10  3 - 12 (%)    Monocytes Absolute 0.7  0.1 - 1.0 (K/uL)    Eosinophils Relative 2  0 - 5 (%)    Eosinophils Absolute 0.1  0.0 - 0.7 (K/uL)    Basophils Relative 0  0 - 1 (%)    Basophils Absolute 0.0  0.0 - 0.1 (K/uL)   GLUCOSE, CAPILLARY     Status: Abnormal   Collection Time   05/15/11  7:19 AM      Component Value Range Comment   Glucose-Capillary 151 (*) 70 - 99 (mg/dL)    Comment 1 Notify RN     GLUCOSE, CAPILLARY     Status: Abnormal   Collection Time   05/15/11 12:09 PM      Component Value Range Comment   Glucose-Capillary 164 (*) 70 - 99 (mg/dL)    Comment 1 Notify RN     GLUCOSE, CAPILLARY     Status: Abnormal   Collection Time   05/15/11  4:02 PM      Component Value Range Comment   Glucose-Capillary 189 (*) 70 - 99 (mg/dL)    Comment 1 Notify RN     GLUCOSE, CAPILLARY     Status: Abnormal   Collection Time   05/15/11  8:57 PM      Component Value Range Comment   Glucose-Capillary 118 (*) 70 - 99 (mg/dL)    Comment 1 Notify  RN      hysical Exam  Vitals reviewed.  Constitutional: She is oriented to person, place, and time. She appears well-developed.  HENT:  Head: Normocephalic. PERRL, EOMI  Neck: Normal range of motion. Neck supple. No thyromegaly present.  Cardiovascular: Normal rate and regular rhythm.  Pulmonary/Chest: Effort normal and breath sounds normal. She has no wheezes.  Abdominal: Soft. She exhibits no distension. There is no tenderness.  Musculoskeletal: She exhibits  no edema.  Neurological: She is alert and oriented to person, place, and time.  She is somewhat hard of hearing (Right more affected than left). She wears hearing aids on either ear. Shows good awareness of deficits. Follow three-step commands.Severely Dysarthric speech. Gaze intact. Poor visual acuity overall. Right facial weakness and lower facial sensory loss with tongue deviation. A few beats of nystagmus with superior gaze and left gaze. RUE 0'/5. RLE 2+ prox to 0/trace distally with ADF and 1+/5 APF. Left side motor and sensory nromal. Sensation 1/2 on right (feels pins and needles). She can sense pain however.  Skin: Skin is warm and dry.  Psychiatric: She has a normal mood and affect.  Mild anxiety  Assessment/Plan: 1. 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 Physiatrist is providing close team supervision and 24 hour management of active medical problems listed below. Physiatrist and rehab team continue to assess barriers to discharge/monitor patient progress toward functional and medical goals. FIM: FIM - Bathing Bathing Steps Patient Completed: Chest;Right Arm;Abdomen;Front perineal area;Right upper leg;Left upper leg Bathing: 3: Mod-Patient completes 5-7 37f 10 parts or 50-74%  FIM - Upper Body Dressing/Undressing Upper body dressing/undressing steps patient completed: Thread/unthread left sleeve of pullover shirt/dress;Put head through opening of pull over shirt/dress Upper body dressing/undressing: 3: Mod-Patient completed 50-74% of tasks FIM - Lower Body Dressing/Undressing Lower body dressing/undressing steps patient completed: Thread/unthread left pants leg;Thread/unthread left underwear leg Lower body dressing/undressing: 2: Max-Patient completed 25-49% of tasks  FIM - Toileting Toileting steps completed by patient: Performs perineal  hygiene Toileting Assistive Devices: Grab bar or rail for support Toileting: 2: Max-Patient completed 1 of 3 steps  FIM - Diplomatic Services operational officer Devices: Psychiatrist Transfers: 3-To toilet/BSC: Mod A (lift or lower assist);3-From toilet/BSC: Mod A (lift or lower assist)  FIM - Bed/Chair Transfer Bed/Chair Transfer: 4: Supine > Sit: Min A (steadying Pt. > 75%/lift 1 leg);4: Sit > Supine: Min A (steadying pt. > 75%/lift 1 leg);3: Bed > Chair or W/C: Mod A (lift or lower assist);3: Chair or W/C > Bed: Mod A (lift or lower assist)  FIM - Locomotion: Wheelchair Locomotion: Wheelchair: 1: Travels less than 50 ft with minimal assistance (Pt.>75%) FIM - Locomotion: Ambulation Ambulation/Gait Assistance: 1: +1 Total assist Locomotion: Ambulation: 1: Travels less than 50 ft with total assistance/helper does all (Pt.<25%)  Comprehension Comprehension Mode: Auditory Comprehension: 5-Set-up assist with hearing device  Expression Expression Mode: Verbal Expression: 5-Expresses basic needs/ideas: With no assist  Social Interaction Social Interaction: 6-Interacts appropriately with others with medication or extra time (anti-anxiety, antidepressant).  Problem Solving Problem Solving: 5-Solves basic problems: With no assist  Memory Memory: 6-More than reasonable amt of time   Medical Problem List and Plan:  1. Left medullary and cerebellar infarct  2. DVT Prophylaxis/Anticoagulation: Subcutaneous lovenox. Monitor platelet counts and any signs of bleeding  3. Left neck mandible mass. Await biopsy today  4. 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 Plan outpatient followup for revascularization with intervention radiology  5. Diabetes mellitus. Hemoglobin A1c of 10.2. Lantus insulin 15 units at bedtime. Check blood sugars a.c. and at bedtime. Patient on glucophage 1000 mg twice daily prior to admission monitor 6. Hypertension. Lisinopril 40 mg daily. Monitor with increased  activity  7. Tobacco abuse. Nicoderm patch.  8. Restless leg syndrome. Requip each bedtime  9. Depression.celexa. Provide emotional support and positive reinforcement  10. Hyperlipidemia. Lipitor  P   LOS (Days) 2 A FACE TO FACE EVALUATION WAS PERFORMED  Ketty Bitton E 05/16/2011, 7:28 AM

## 2011-05-16 NOTE — Progress Notes (Signed)
Occupational Therapy Session Note  Patient Details  Name: Chelsey Gallagher MRN: [id number] Date of Birth: [birth year]  Today's Date: 05/16/2011 Time: 1308-6578 and 1330-1400 Time Calculation (min): 62 min and 30 min  Short Term Goals: Week 1:  OT Short Term Goal 1 (Week 1): Pt will complete UB dressing with min assist OT Short Term Goal 2 (Week 1): Pt will complete LB dressing with mod assist OT Short Term Goal 3 (Week 1): Pt will complete bathing with min assist OT Short Term Goal 4 (Week 1): Pt will complete toilet transfer with min assist OT Short Term Goal 5 (Week 1): Pt will complete toileting with mod assist (2 of 3 steps)  Skilled Therapeutic Interventions/Progress Updates:    1) Pt seen for ADL retraining with focus on bathing and dressing with decreased assist by using adaptive strategies and/or hemi-technique.  Tub bench transfer in room walk-in shower from w/c with mod assist with positioning of RLE prior to sit to stand, stand pivot transfer with use of grab bars and tactile cues at hips for trunk stability.  Pt demonstrated increased independence with bathing with setup to bring feet up to knees to complete LLE bathing and steady assist in standing to maintain standing balance.  Pt quick to respond to adaptations and able to recall cues provided from day before.  2) Engaged in NM re-ed in supine and sitting with focus PROM and A/AROM at Rt scapula, shoulder, and elbow.  Pt demonstrated decreased scapular mobility, but demonstrated slight bicep movement with elbow extension with gravity eliminated.  Therapy Documentation Precautions:  Precautions Precautions: Fall Required Braces or Orthoses: No Restrictions Weight Bearing Restrictions: No Other Position/Activity Restrictions: right hemiplegia, decreased right side sensation, Dys. 3 with thin liquids Pain: Pain Assessment Pain Assessment: No/denies pain Pain Score: 0-No pain  See FIM for current functional  status  Therapy/Group: Individual Therapy  [name] 05/16/2011, 11:12 AM

## 2011-05-16 NOTE — Progress Notes (Signed)
Orthopedic Tech Progress Note Patient Details:  Raizy Auzenne Feb 20, 1951 [id number]  Patient ID: [name], female   DOB: June 21, 1950, 61 y.o.   MRN: [id number]   Shawnie Pons 05/16/2011, 12:29 PM Standley Dakins with advanced prosthetics

## 2011-05-16 NOTE — Progress Notes (Signed)
[facility] Individual Statement of Services  Patient Name:  Chelsey Gallagher  Date:  05/16/2011  Welcome to the [facility].  Our goal is to provide you with an individualized program based on your diagnosis and situation, designed to meet your specific needs.  With this comprehensive rehabilitation program, you will be expected to participate in at least 3 hours of rehabilitation therapies Monday-Friday, with modified therapy programming on the weekends.  Your rehabilitation program will include the following services:  Physical Therapy (PT), Occupational Therapy (OT), Speech Therapy (ST), 24 hour per day rehabilitation nursing, Therapeutic Recreaction (TR), Case Management (RN and Child psychotherapist), Rehabilitation Medicine, Nutrition Services and Pharmacy Services  Weekly team conferences will be held on Wednesdays to discuss your progress.  Your RN Case Designer, television/film set will talk with you frequently to get your input and to update you on team discussions.  Team conferences with you and your family in attendance may also be held.  Expected length of stay: 2-3 weeks   Overall anticipated outcome: Supervision - minimum assistance at wheelchair level   Depending on your progress and recovery, your program may change.  Your RN Case Estate agent will coordinate services and will keep you informed of any changes.  Your RN Sports coach and SW names and contact numbers are listed  below.  The following services may also be recommended but are not provided by the [facility]:   Driving Evaluations  Home Health Rehabiltiation Services  Outpatient Rehabilitatation [facility]  Vocational Rehabilitation   Arrangements will be made to provide these services after discharge if needed.  Arrangements include referral to agencies that provide these services.  Your insurance has been verified to be:  [employer] Your primary  doctor is:  Dr Tresea Mall  Pertinent information will be shared with your doctor and your insurance company.  Case Manager: Lutricia Horsfall, [facility] [phone]  Social Worker:  Dossie Der, [place] [phone]  Information discussed with and copy given to patient by: Meryl Dare, 05/16/2011

## 2011-05-16 NOTE — Progress Notes (Signed)
Per State Regulation 482.30 This chart was reviewed for medical necessity with respect to the patient's Admission/Duration of stay. Pt participating and progressing in therapies. Very motivated. Multiple medical issues. Meryl Dare                 Nurse Care Manager            Next Review Date: 05/19/11

## 2011-05-16 NOTE — Progress Notes (Signed)
Physical Therapy Session Note  Patient Details  Name: Chelsey Gallagher MRN: [id number] Date of Birth: May 31, 1950  Today's Date: 05/16/2011 Time: 1555-1700 Time Calculation (min): 65 min  Short Term Goals: Week 1:  PT Short Term Goal 1 (Week 1): Patient will perform bed mobility with min assist demonstrating typical movement patterns. PT Short Term Goal 1 - Progress (Week 1): Progressing toward goal PT Short Term Goal 2 (Week 1): Patient will consistently transfer with mod assist. PT Short Term Goal 2 - Progress (Week 1): Progressing toward goal PT Short Term Goal 3 (Week 1): Patient will maintain dynamic sitting balance > 10 min with supervision during functional activity. PT Short Term Goal 3 - Progress (Week 1): Progressing toward goal PT Short Term Goal 4 (Week 1): Patient will maintain dynamic standing balance > 10 min with mod assist for dynamic pre-gait training. PT Short Term Goal 4 - Progress (Week 1): Progressing toward goal PT Short Term Goal 5 (Week 1): Patient will propel w/c > 100 feet with min assist in controlled environment, PT Short Term Goal 5 - Progress (Week 1): Progressing toward goal  Skilled Therapeutic Interventions/Progress Updates:     Therapy Documentation Precautions:  Precautions Precautions: Fall Required Braces or Orthoses: No Restrictions Weight Bearing Restrictions: No Other Position/Activity Restrictions: right hemiplegia, decreased right side sensation, Dys. 3 with thin liquids Pain: Pain Assessment Pain Assessment: No/denies pain Pain Score: 0-No pain    Treatments:  Squat-pivot transfer training left and right using over the back Bobath technique mod assist, focus on right foot and UE placement and anterior weight shift for increased functional independence. Right foot positioned in plantar flexion, eversion, and supination due to contracture. Right foot stretches with calcaneus distraction and tilting and metatarsal mobilization for increased  functional ROM followed by sit to squat position mod assist with PT positioning right foot in neutral rotation and further facilitate weight bearing stretch to achieve foot flat on floor. In squat position, focus on midline weight bearing progressing to squat position with lateral weight shifts mod assist, emphasis on graded right lateral weight shift and sustained right LE activation. Progressed to sit to squat to standing, manual facilitation for right foot/ankle and hip alignment, focus on midline weight bearing and sustained right stance components mod assist. Standing dynamic pre-gait activities with manual facilitation for right foot placement, right hip extension, and graded right lateral weight shift including left heel lifts and stepping forward and backward with left LE to facilitate timing and sequencing of right stance phase of gait. Gait x 40 feet total assist +2 bilat hand held assist, patient = 25%, manual facilitation for graded right weight shift, timing of right stance phase, and alignment of right LE and trunk in stance and right swing through. Patient with decreased right foot clearance in swing phase due to increased extension tone and ankle contracture.  Discussed potential splint option for right LE with OT Bretta Bang) and CPO [name]). Currently problem solving with team for best brace option for allow for positioning of right foot and ankle in mor neutral position to stretch and prevent further contracture for increased functional mobility. CPO to consult next week on Monday or Tuesday. ? NDT style day splint vs. Night splint that can be worn during therapies to achieve foot flat in weight bearing positions.   See FIM for current functional status  Therapy/Group: Individual Therapy  Romeo Rabon 05/16/2011, 5:17 PM

## 2011-05-16 NOTE — Progress Notes (Signed)
Speech Language Pathology Daily Session Note  Patient Details  Name: Sydna Brodowski MRN: [id number] Date of Birth: 25-Mar-1950  Today's Date: 05/16/2011 Time: 0805-0900 Time Calculation (min): 55 min  Short Term Goals: Week 1: SLP Short Term Goal 1 (Week 1): Patient will consume Dys. 3 thin liquid diet without observed clinical signs of aspiration with: Supervision/safety cues for use of strategies SLP Short Term Goal 2 (Week 1): Patient will demonstrae timely mastication of regular textures with minimal assist cues SLP Short Term Goal 3 (Week 1): Patient will demonstrate slow, over articulated speech at the conversational level with minimal assist semantic cues to utilize compensatory strategies SLP Short Term Goal 4 (Week 1): Patient will identify effective thechiques to facilitate her speech intelligibility with moderate assist semantic cues  Skilled Therapeutic Interventions: Session focused on diagnostic treatment of dysphagia and dysarthria; SLP facilitated session by assisting patient to fully sit up in bed and provided her with her glasses and hearing aids which she was able to put on with increased wait time; patient consumed Dys.3 textures and thin liquids via cup with min assist semantic cues faded to supervision semantic cues to consume small bites and sips, monitor wet vocal quality and not talk with food in her mouth; patient demonstrated cough x3 due to portion control and talking to SLP while she was eating.  SLP suspects that after set up assist of tray patient would follow strategies more consistently with only intermittent supervision.    SLP further facilitated session with supervision semantic cues to slow rate of speech at the conversational level to facilitate increased speech intelligibility throughout session.    Daily Session Precautions/Restrictions    FIM:  Comprehension Comprehension Mode: Auditory Comprehension: 6-Follows complex conversation/direction: With  extra time/assistive device Expression Expression Mode: Verbal Expression: 5-Expresses basic needs/ideas: With extra time/assistive device Social Interaction Social Interaction: 6-Interacts appropriately with others with medication or extra time (anti-anxiety, antidepressant). Problem Solving Problem Solving: 5-Solves basic problems: With no assist Memory Memory: 6-More than reasonable amt of time FIM - Eating Eating Activity: 5: Supervision/cues General    Pain Pain Assessment Pain Assessment: No/denies pain Pain Score: 0-No pain  Therapy/Group: Individual Therapy  Tierany Appleby 05/16/2011, 10:49 AM

## 2011-05-17 DIAGNOSIS — Z5189 Encounter for other specified aftercare: Secondary | ICD-10-CM

## 2011-05-17 DIAGNOSIS — G8110 Spastic hemiplegia affecting unspecified side: Secondary | ICD-10-CM

## 2011-05-17 DIAGNOSIS — I6330 Cerebral infarction due to thrombosis of unspecified cerebral artery: Secondary | ICD-10-CM

## 2011-05-17 LAB — GLUCOSE, CAPILLARY
Glucose-Capillary: 135 mg/dL — ABNORMAL HIGH (ref 70–99)
Glucose-Capillary: 108 mg/dL — ABNORMAL HIGH (ref 70–99)

## 2011-05-17 MED ORDER — GABAPENTIN 800 MG PO TABS
800.0000 mg | ORAL_TABLET | Freq: Every day | ORAL | Status: DC
  Administered 2011-05-17 – 2011-05-18 (×2): 800 mg via ORAL
  Filled 2011-05-17 (×3): qty 1

## 2011-05-17 NOTE — Progress Notes (Signed)
Speech Language Pathology Daily Session Note  Patient Details  Name: Chelsey Gallagher MRN: [id number] Date of Birth: 02-08-1951  Today's Date: 05/17/2011 Time: 0800-0845 Time Calculation (min): 45 min  Short Term Goals: Week 1: SLP Short Term Goal 1 (Week 1): Patient will consume Dys. 3 thin liquid diet without observed clinical signs of aspiration with: Supervision/safety cues for use of strategies SLP Short Term Goal 2 (Week 1): Patient will demonstrae timely mastication of regular textures with minimal assist cues SLP Short Term Goal 3 (Week 1): Patient will demonstrate slow, over articulated speech at the conversational level with minimal assist semantic cues to utilize compensatory strategies SLP Short Term Goal 4 (Week 1): Patient will identify effective thechiques to facilitate her speech intelligibility with moderate assist semantic cues  Skilled Therapeutic Interventions: SLP facilitated session with diagnostic treatment of speech intelligibility and upon further discussion with patient, who reports that /s/ and possibly vocalic /r/ errors were premorbid due to hearing loss; /m/ /n/ "ing" errors only present in conversation; uvula deviates to the right indicating left sided weakness which could be contributing to hyponasality during connected speech.  Overall, patient is able to be understood with increased wait time to self correct.    During session patient was able to self advocate for readjustment in bed prior to beginning breadkfast; patient was mod I for set up and use of compensatory strategies with cough x1 and patient was able to identify problem and correct for rest of meal.  Daily Session Precautions/Restrictions    FIM:  Comprehension Comprehension Mode: Auditory Comprehension: 6-Follows complex conversation/direction: With extra time/assistive device Expression Expression Mode: Verbal Expression: 6-Expresses complex ideas: With extra time/assistive device Social  Interaction Social Interaction: 5-Interacts appropriately 90% of the time - Needs monitoring or encouragement for participation or interaction. Problem Solving Problem Solving: 5-Solves complex 90% of the time/cues < 10% of the time Memory Memory: 6-More than reasonable amt of time FIM - Eating Eating Activity: 6: More than reasonable amount of time General    Pain Pain Assessment Pain Assessment: No/denies pain Pain Score: 0-No pain  Therapy/Group: Individual Therapy  Fae Pippin, M.A., CCC-SLP [phone]

## 2011-05-17 NOTE — Plan of Care (Signed)
Problem: RH BLADDER ELIMINATION Goal: RH STG MANAGE BLADDER WITH ASSISTANCE STG Manage Bladder With mod independence  Outcome: Not Progressing Incontinent episode this morning.

## 2011-05-17 NOTE — Progress Notes (Signed)
Physical Therapy Note  Patient Details  Name: Chelsey Gallagher MRN: [id number] Date of Birth: December 04, 1950 Today's Date: 05/17/2011  1400-1455 (55 minutes) individual Pain- no complaint of pain Focus of treatment: Neuro re-ed RT LE; gait training Treatment: Transfer- scoot to squat/pivot mod assist wc<>mat; RT LE active hip flexion/extension, knee flexion/extension in sidelying using powder board (gravity eliminated), no active Rt ankle movement noted; passive stretch Rt Pfs in supine; Gait 20 feet X 3 (ace wrap Rt ankle) mod assist using rail left with vcs to slow down and concentrate on stepping on RT. Pt. Somewhat impulsive.   Jamar Casagrande,JIM 05/17/2011, 3:02 PM

## 2011-05-17 NOTE — Progress Notes (Signed)
Patient ID: Chelsey Gallagher, female   DOB: [birth year], 61 y.o.   MRN: [id number] Patient ID: Chelsey Gallagher, female   DOB: 1950/08/20, 61 y.o.   MRN: [id number]   Subjective/Complaints: 3/16- complain of RLS sx last night. Otherwise doing well.  Objective: Vital Signs: Blood pressure 138/79, pulse 97, temperature 98.3 F (36.8 C), temperature source Oral, resp. rate 16, height 5\' 3"  (1.6 m), SpO2 90.00%. No results found. Results for orders placed during the hospital encounter of 05/14/11 (from the past 72 hour(s))  BASIC METABOLIC PANEL     Status: Abnormal   Collection Time   05/14/11  4:28 PM      Component Value Range Comment   Sodium 131 (*) 135 - 145 (mEq/L)    Potassium 3.8  3.5 - 5.1 (mEq/L)    Chloride 94 (*) 96 - 112 (mEq/L)    CO2 29  19 - 32 (mEq/L)    Glucose, Bld 141 (*) 70 - 99 (mg/dL)    BUN 15  6 - 23 (mg/dL)    Creatinine, Ser 7.82 (*) 0.50 - 1.10 (mg/dL)    Calcium 95.6  8.4 - 10.5 (mg/dL)    GFR calc non Af Amer >90  >90 (mL/min)    GFR calc Af Amer >90  >90 (mL/min)   GLUCOSE, CAPILLARY     Status: Abnormal   Collection Time   05/14/11  4:34 PM      Component Value Range Comment   Glucose-Capillary 160 (*) 70 - 99 (mg/dL)    Comment 1 Notify RN     GLUCOSE, CAPILLARY     Status: Abnormal   Collection Time   05/14/11  8:39 PM      Component Value Range Comment   Glucose-Capillary 180 (*) 70 - 99 (mg/dL)    Comment 1 Notify RN     CBC     Status: Abnormal   Collection Time   05/15/11  6:50 AM      Component Value Range Comment   WBC 7.3  4.0 - 10.5 (K/uL)    RBC 5.22 (*) 3.87 - 5.11 (MIL/uL)    Hemoglobin 16.6 (*) 12.0 - 15.0 (g/dL)    HCT 21.3 (*) 08.6 - 46.0 (%)    MCV 91.2  78.0 - 100.0 (fL)    MCH 31.8  26.0 - 34.0 (pg)    MCHC 34.9  30.0 - 36.0 (g/dL)    RDW 57.8  46.9 - 62.9 (%)    Platelets 159  150 - 400 (K/uL)   COMPREHENSIVE METABOLIC PANEL     Status: Abnormal   Collection Time   05/15/11  6:50 AM      Component Value Range Comment   Sodium 135  135 - 145 (mEq/L)    Potassium 4.1  3.5 - 5.1 (mEq/L)    Chloride 97  96 - 112 (mEq/L)    CO2 31  19 - 32 (mEq/L)    Glucose, Bld 141 (*) 70 - 99 (mg/dL)    BUN 14  6 - 23 (mg/dL)    Creatinine, Ser 5.28  0.50 - 1.10 (mg/dL)    Calcium 41.3  8.4 - 10.5 (mg/dL)    Total Protein 6.9  6.0 - 8.3 (g/dL)    Albumin 3.4 (*) 3.5 - 5.2 (g/dL)    AST 27  0 - 37 (U/L)    ALT 27  0 - 35 (U/L)    Alkaline Phosphatase 95  39 - 117 (U/L)  Total Bilirubin 0.7  0.3 - 1.2 (mg/dL)    GFR calc non Af Amer >90  >90 (mL/min)    GFR calc Af Amer >90  >90 (mL/min)   DIFFERENTIAL     Status: Normal   Collection Time   05/15/11  6:50 AM      Component Value Range Comment   Neutrophils Relative 63  43 - 77 (%)    Neutro Abs 4.6  1.7 - 7.7 (K/uL)    Lymphocytes Relative 25  12 - 46 (%)    Lymphs Abs 1.8  0.7 - 4.0 (K/uL)    Monocytes Relative 10  3 - 12 (%)    Monocytes Absolute 0.7  0.1 - 1.0 (K/uL)    Eosinophils Relative 2  0 - 5 (%)    Eosinophils Absolute 0.1  0.0 - 0.7 (K/uL)    Basophils Relative 0  0 - 1 (%)    Basophils Absolute 0.0  0.0 - 0.1 (K/uL)   GLUCOSE, CAPILLARY     Status: Abnormal   Collection Time   05/15/11  7:19 AM      Component Value Range Comment   Glucose-Capillary 151 (*) 70 - 99 (mg/dL)    Comment 1 Notify RN     GLUCOSE, CAPILLARY     Status: Abnormal   Collection Time   05/15/11 12:09 PM      Component Value Range Comment   Glucose-Capillary 164 (*) 70 - 99 (mg/dL)    Comment 1 Notify RN     GLUCOSE, CAPILLARY     Status: Abnormal   Collection Time   05/15/11  4:02 PM      Component Value Range Comment   Glucose-Capillary 189 (*) 70 - 99 (mg/dL)    Comment 1 Notify RN     GLUCOSE, CAPILLARY     Status: Abnormal   Collection Time   05/15/11  8:57 PM      Component Value Range Comment   Glucose-Capillary 118 (*) 70 - 99 (mg/dL)    Comment 1 Notify RN     GLUCOSE, CAPILLARY     Status: Abnormal   Collection Time   05/16/11  7:22 AM      Component Value  Range Comment   Glucose-Capillary 139 (*) 70 - 99 (mg/dL)    Comment 1 Notify RN     GLUCOSE, CAPILLARY     Status: Abnormal   Collection Time   05/16/11 11:15 AM      Component Value Range Comment   Glucose-Capillary 146 (*) 70 - 99 (mg/dL)    Comment 1 Notify RN     GLUCOSE, CAPILLARY     Status: Abnormal   Collection Time   05/16/11  4:28 PM      Component Value Range Comment   Glucose-Capillary 128 (*) 70 - 99 (mg/dL)    hysical Exam  Vitals reviewed.  Constitutional: She is oriented to person, place, and time. She appears well-developed.  HENT:  Head: Normocephalic. PERRL, EOMI  Neck: Normal range of motion. Neck supple. No thyromegaly present.  Cardiovascular: Normal rate and regular rhythm.  Pulmonary/Chest: Effort normal and breath sounds normal. She has no wheezes.  Abdominal: Soft. She exhibits no distension. There is no tenderness.  Musculoskeletal: She exhibits no edema.  Neurological: She is alert and oriented to person, place, and time.  She is somewhat hard of hearing (Right more affected than left). She wears hearing aids on either ear. Shows good awareness of deficits. Follow three-step  commands.Severely Dysarthric speech. Gaze intact. Poor visual acuity overall. Right facial weakness and lower facial sensory loss with tongue deviation.  Perhaps mild right ptosis. A few beats of nystagmus with superior gaze and left gaze. RUE 0'/5. RLE 2+ prox to 0/trace distally with ADF and 1+/5 APF. Left side motor and sensory nromal. Sensation 1/2 on right (feels pins and needles). She can sense pain however.  Skin: Skin is warm and dry.  Psychiatric: She has a normal mood and affect.  Exam 3/16  Assessment/Plan: 1. 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 Physiatrist is providing close team supervision and 24 hour management of active medical  problems listed below. Physiatrist and rehab team continue to assess barriers to discharge/monitor patient progress toward functional and medical goals. FIM: FIM - Bathing Bathing Steps Patient Completed: Right Arm;Chest;Left Arm;Abdomen;Front perineal area;Right upper leg;Left upper leg Bathing: 3: Mod-Patient completes 5-7 52f 10 parts or 50-74%  FIM - Upper Body Dressing/Undressing Upper body dressing/undressing steps patient completed: Thread/unthread right bra strap;Thread/unthread left bra strap;Thread/unthread right sleeve of pullover shirt/dresss;Thread/unthread left sleeve of pullover shirt/dress;Put head through opening of pull over shirt/dress;Pull shirt over trunk Upper body dressing/undressing: 4: Min-Patient completed 75 plus % of tasks FIM - Lower Body Dressing/Undressing Lower body dressing/undressing steps patient completed: Thread/unthread right underwear leg;Thread/unthread left underwear leg;Thread/unthread right pants leg;Thread/unthread left pants leg;Don/Doff right sock;Don/Doff left sock Lower body dressing/undressing: 3: Mod-Patient completed 50-74% of tasks  FIM - Toileting Toileting steps completed by patient: Performs perineal hygiene Toileting Assistive Devices: Grab bar or rail for support Toileting: 2: Max-Patient completed 1 of 3 steps  FIM - Diplomatic Services operational officer Devices: Psychiatrist Transfers: 3-To toilet/BSC: Mod A (lift or lower assist);3-From toilet/BSC: Mod A (lift or lower assist)  FIM - Bed/Chair Transfer Bed/Chair Transfer: 3: Bed > Chair or W/C: Mod A (lift or lower assist);3: Chair or W/C > Bed: Mod A (lift or lower assist);3: Supine > Sit: Mod A (lifting assist/Pt. 50-74%/lift 2 legs;3: Sit > Supine: Mod A (lifting assist/Pt. 50-74%/lift 2 legs)  FIM - Locomotion: Wheelchair Locomotion: Wheelchair: 1: Travels less than 50 ft with minimal assistance (Pt.>75%) FIM - Locomotion: Ambulation Locomotion: Ambulation  Assistive Devices:  (bilat hand held assist) Ambulation/Gait Assistance: 1: +1 Total assist Locomotion: Ambulation: 1: Two helpers  Comprehension Comprehension Mode: Auditory Comprehension: 6-Follows complex conversation/direction: With extra time/assistive device  Expression Expression Mode: Verbal Expression: 5-Expresses basic needs/ideas: With extra time/assistive device  Social Interaction Social Interaction: 5-Interacts appropriately 90% of the time - Needs monitoring or encouragement for participation or interaction.  Problem Solving Problem Solving: 5-Solves basic problems: With no assist  Memory Memory: 6-More than reasonable amt of time   Medical Problem List and Plan:  1. Left medullary and cerebellar infarct  2. DVT Prophylaxis/Anticoagulation: Subcutaneous lovenox. Monitor platelet counts and any signs of bleeding  3. Left neck mandible mass. Bx pending  4. 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 Plan outpatient followup for revascularization with intervention radiology  5. Diabetes mellitus. Hemoglobin A1c of 10.2. Lantus insulin 15 units at bedtime. Check blood sugars a.c. and at bedtime. Patient on glucophage 1000 mg twice daily PTA. Sugars under pretty good control currently 6. Hypertension. Lisinopril 40 mg daily. Monitor with increased activity  7. Tobacco abuse. Nicoderm patch.  8. Restless leg syndrome. Requip and neurontin at bedtime but still having breakthrough sx. Consider adjustment 9. Depression.celexa. Provide  emotional support and positive reinforcement  10. Hyperlipidemia. Lipitor  P   LOS (Days) 3 A FACE TO FACE EVALUATION WAS PERFORMED  Omer Monter T 05/17/2011, 7:44 AM

## 2011-05-17 NOTE — Progress Notes (Signed)
Occupational Therapy Session Note  Patient Details  Name: Chelsey Gallagher MRN: [id number] Date of Birth: 1950/09/28  Today's Date: 05/17/2011 Time: 1000-1100  1st session;  1300-1330  2nd session Time Calculation (min): 60 min;  30 min  Short Term Goals: Week 1:  OT Short Term Goal 1 (Week 1): Pt will complete UB dressing with min assist OT Short Term Goal 2 (Week 1): Pt will complete LB dressing with mod assist OT Short Term Goal 3 (Week 1): Pt will complete bathing with min assist OT Short Term Goal 4 (Week 1): Pt will complete toilet transfer with min assist OT Short Term Goal 5 (Week 1): Pt will complete toileting with mod assist (2 of 3 steps)  Skilled Therapeutic Interventions/Progress Updates:    1st session: Addressed bathing and dressing at sink level.  Did sit to stand with min. Assist and stood for 2 mins while practicing stepping forward and backward.  Pt. Bathed feet with minimal cues.      2nd session:  Addressed RUE neuromuscular re education with scapula mobilization to scapula humeral, clavicular acromion, gleno humeral joint.  Pt had increased trigger point pain in teres minor and alecronon of right elbow.  Instructed pt in shoulder exercises.   Therapy Documentation Precautions:  Precautions Precautions: Fall Required Braces or Orthoses: No Restrictions Weight Bearing Restrictions: No Other Position/Activity Restrictions: right hemiplegia, decreased right side sensation, Dys. 3 with thin liquids       Pain: Pain Assessment Pain Assessment: No/denies pain Pain Score: 0-No pain ADL: ADL Grooming: Moderate assistance Where Assessed-Grooming: Sitting at sink Upper Body Bathing: Minimal assistance Where Assessed-Upper Body Bathing: Sitting at sink Lower Body Bathing: Maximal assistance Where Assessed-Lower Body Bathing: Sitting at sink;Standing at sink Upper Body Dressing: Moderate assistance Where Assessed-Upper Body Dressing: Sitting at sink Lower Body  Dressing: Maximal assistance Where Assessed-Lower Body Dressing: Sitting at sink;Standing at sink Toileting: Maximal assistance Where Assessed-Toileting: Teacher, adult education: Moderate assistance Toilet Transfer Method: Stand pivot Tub/Shower Transfer: Not assessed Psychologist, counselling Transfer: Not assessed        See FIM for current functional status  Therapy/Group: Individual Therapy  Humberto Seals 05/17/2011, 11:09 AM

## 2011-05-18 LAB — GLUCOSE, CAPILLARY
Glucose-Capillary: 170 mg/dL — ABNORMAL HIGH (ref 70–99)
Glucose-Capillary: 175 mg/dL — ABNORMAL HIGH (ref 70–99)

## 2011-05-18 NOTE — Progress Notes (Signed)
Subjective/Complaints: 3/17- seemed to sleep better last night Objective: Vital Signs: Blood pressure 162/63, pulse 71, temperature 97.7 F (36.5 C), temperature source Oral, resp. rate 16, height 5\' 3"  (1.6 m), SpO2 90.00%. No results found. Results for orders placed during the hospital encounter of 05/14/11 (from the past 72 hour(s))  GLUCOSE, CAPILLARY     Status: Abnormal   Collection Time   05/15/11  7:19 AM      Component Value Range Comment   Glucose-Capillary 151 (*) 70 - 99 (mg/dL)    Comment 1 Notify RN     GLUCOSE, CAPILLARY     Status: Abnormal   Collection Time   05/15/11 12:09 PM      Component Value Range Comment   Glucose-Capillary 164 (*) 70 - 99 (mg/dL)    Comment 1 Notify RN     GLUCOSE, CAPILLARY     Status: Abnormal   Collection Time   05/15/11  4:02 PM      Component Value Range Comment   Glucose-Capillary 189 (*) 70 - 99 (mg/dL)    Comment 1 Notify RN     GLUCOSE, CAPILLARY     Status: Abnormal   Collection Time   05/15/11  8:57 PM      Component Value Range Comment   Glucose-Capillary 118 (*) 70 - 99 (mg/dL)    Comment 1 Notify RN     GLUCOSE, CAPILLARY     Status: Abnormal   Collection Time   05/16/11  7:22 AM      Component Value Range Comment   Glucose-Capillary 139 (*) 70 - 99 (mg/dL)    Comment 1 Notify RN     GLUCOSE, CAPILLARY     Status: Abnormal   Collection Time   05/16/11 11:15 AM      Component Value Range Comment   Glucose-Capillary 146 (*) 70 - 99 (mg/dL)    Comment 1 Notify RN     GLUCOSE, CAPILLARY     Status: Abnormal   Collection Time   05/16/11  4:28 PM      Component Value Range Comment   Glucose-Capillary 128 (*) 70 - 99 (mg/dL)   GLUCOSE, CAPILLARY     Status: Abnormal   Collection Time   05/16/11  8:40 PM      Component Value Range Comment   Glucose-Capillary 135 (*) 70 - 99 (mg/dL)   GLUCOSE, CAPILLARY     Status: Abnormal   Collection Time   05/17/11  7:44 AM      Component Value Range Comment   Glucose-Capillary  119 (*) 70 - 99 (mg/dL)    Comment 1 Notify RN     GLUCOSE, CAPILLARY     Status: Abnormal   Collection Time   05/17/11 11:47 AM      Component Value Range Comment   Glucose-Capillary 185 (*) 70 - 99 (mg/dL)    Comment 1 Notify RN     GLUCOSE, CAPILLARY     Status: Abnormal   Collection Time   05/17/11  5:06 PM      Component Value Range Comment   Glucose-Capillary 108 (*) 70 - 99 (mg/dL)    Comment 1 Notify RN     GLUCOSE, CAPILLARY     Status: Abnormal   Collection Time   05/17/11  8:48 PM      Component Value Range Comment   Glucose-Capillary 163 (*) 70 - 99 (mg/dL)    Comment 1 Notify RN      hysical  Exam  Vitals reviewed.  Constitutional: She is oriented to person, place, and time. She appears well-developed.  HENT:  Head: Normocephalic. PERRL, EOMI  Neck: Normal range of motion. Neck supple. No thyromegaly present.  Cardiovascular: Normal rate and regular rhythm.  Pulmonary/Chest: Effort normal and breath sounds normal. She has no wheezes.  Abdominal: Soft. She exhibits no distension. There is no tenderness.  Musculoskeletal: She exhibits no edema.  Neurological: She is alert and oriented to person, place, and time.  She is somewhat hard of hearing (Right more affected than left). She wears hearing aids on either ear. Shows good awareness of deficits. Follow three-step commands.Severely Dysarthric speech. Gaze intact. Poor visual acuity overall. Right facial weakness and lower facial sensory loss with tongue deviation.  Perhaps mild right ptosis. A few beats of nystagmus with superior gaze and left gaze. RUE 0'/5. RLE 2+ prox to 0/trace distally with ADF and 1+/5 APF. Left side motor and sensory nromal. Sensation 1/2 on right (feels pins and needles). She can sense pain however.  Skin: Skin is warm and dry.  Psychiatric: She has a normal mood and affect.  Exam 3/17  Assessment/Plan: 1. 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 Physiatrist is providing close team supervision and 24 hour management of active medical problems listed below. Physiatrist and rehab team continue to assess barriers to discharge/monitor patient progress toward functional and medical goals. FIM: FIM - Bathing Bathing Steps Patient Completed: Chest;Right Arm;Left Arm;Abdomen;Right upper leg;Left upper leg;Front perineal area Bathing: 4: Min-Patient completes 8-9 67f 10 parts or 75+ percent  FIM - Upper Body Dressing/Undressing Upper body dressing/undressing steps patient completed: Thread/unthread right sleeve of pullover shirt/dresss;Thread/unthread left sleeve of pullover shirt/dress;Put head through opening of pull over shirt/dress Upper body dressing/undressing: 4: Min-Patient completed 75 plus % of tasks FIM - Lower Body Dressing/Undressing Lower body dressing/undressing steps patient completed: Thread/unthread right underwear leg;Thread/unthread left underwear leg;Thread/unthread right pants leg;Thread/unthread left pants leg;Don/Doff right sock;Don/Doff left sock Lower body dressing/undressing: 4: Min-Patient completed 75 plus % of tasks  FIM - Toileting Toileting steps completed by patient: Performs perineal hygiene Toileting Assistive Devices: Grab bar or rail for support Toileting: 2: Max-Patient completed 1 of 3 steps  FIM - Diplomatic Services operational officer Devices: Psychiatrist Transfers: 3-To toilet/BSC: Mod A (lift or lower assist);3-From toilet/BSC: Mod A (lift or lower assist)  FIM - Bed/Chair Transfer Bed/Chair Transfer: 3: Bed > Chair or W/C: Mod A (lift or lower assist);3: Chair or W/C > Bed: Mod A (lift or lower assist)  FIM - Locomotion: Wheelchair Locomotion: Wheelchair: 1: Travels less than 50 ft with minimal assistance (Pt.>75%) FIM - Locomotion: Ambulation Locomotion: Ambulation Assistive Devices:  (bilat  hand held assist) Ambulation/Gait Assistance: 1: +1 Total assist Locomotion: Ambulation: 1: Two helpers  Comprehension Comprehension Mode: Auditory Comprehension: 6-Follows complex conversation/direction: With extra time/assistive device  Expression Expression Mode: Verbal Expression: 6-Expresses complex ideas: With extra time/assistive device  Social Interaction Social Interaction: 5-Interacts appropriately 90% of the time - Needs monitoring or encouragement for participation or interaction.  Problem Solving Problem Solving: 5-Solves complex 90% of the time/cues < 10% of the time  Memory Memory: 6-More than reasonable amt of time   Medical Problem List and Plan:  1. Left medullary and cerebellar infarct  2. DVT Prophylaxis/Anticoagulation: Subcutaneous lovenox. Monitor platelet counts and any signs of bleeding  3. Left neck mandible mass.  Bx pending  4. 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 Plan outpatient followup for revascularization with intervention radiology  5. Diabetes mellitus. Hemoglobin A1c of 10.2. Lantus insulin 15 units at bedtime. Check blood sugars a.c. and at bedtime. Patient on glucophage 1000 mg twice daily PTA. Sugars under pretty good control 6. Hypertension. Lisinopril 40 mg daily. Monitor with increased activity  7. Tobacco abuse. Nicoderm patch.  8. Restless leg syndrome. Requip and neurontin at bedtime but still having breakthrough sx. Increased neurontin to 800mg .  We really can't go a lot higher with her current meds.  ?klonopin trial 9. Depression.celexa. Provide emotional support and positive reinforcement  10. Hyperlipidemia. Lipitor  P   LOS (Days) 4 A FACE TO FACE EVALUATION WAS PERFORMED  Aviv Rota T 05/18/2011, 7:08 AM

## 2011-05-18 NOTE — Progress Notes (Addendum)
Occupational Therapy Session Note  Patient Details  Name: Chelsey Gallagher MRN: [id number] Date of Birth: Sep 09, 1950  Today's Date: 05/18/2011 Time: 1300-1400 Time Calculation (min): 60 min  Short Term Goals: Week 1:  OT Short Term Goal 1 (Week 1): Pt will complete UB dressing with min assist OT Short Term Goal 2 (Week 1): Pt will complete LB dressing with mod assist OT Short Term Goal 3 (Week 1): Pt will complete bathing with min assist OT Short Term Goal 4 (Week 1): Pt will complete toilet transfer with min assist OT Short Term Goal 5 (Week 1): Pt will complete toileting with mod assist (2 of 3 steps)  Skilled Therapeutic Interventions/Progress Updates:    OT treatment with focus on neuromuscular re education of RUE, closed chain activities, weight bearing, grasp/ and release.  Pt exhibited som shoulder elevation and forward flexion of right shoulder>  After scapular mobilization, pt showed better movement with gravity eliminated plus manual cues to move arm and not trunk.   Pt. Was able to grasp a clothes pin with minimal assist and release with moderate assist.    Therapy Documentation Precautions:  Precautions Precautions: Fall Required Braces or Orthoses: No Restrictions Weight Bearing Restrictions: No Other Position/Activity Restrictions: right hemiplegia, decreased right side sensation, Dys. 3 with thin liquids    Pain:none    See FIM for current functional status  Therapy/Group: Individual Therapy  Humberto Seals 05/18/2011, 5:13 PM

## 2011-05-19 DIAGNOSIS — I6330 Cerebral infarction due to thrombosis of unspecified cerebral artery: Secondary | ICD-10-CM

## 2011-05-19 DIAGNOSIS — Z5189 Encounter for other specified aftercare: Secondary | ICD-10-CM

## 2011-05-19 DIAGNOSIS — G8110 Spastic hemiplegia affecting unspecified side: Secondary | ICD-10-CM

## 2011-05-19 LAB — GLUCOSE, CAPILLARY: Glucose-Capillary: 127 mg/dL — ABNORMAL HIGH (ref 70–99)

## 2011-05-19 MED ORDER — BOOST / RESOURCE BREEZE PO LIQD
1.0000 | Freq: Every day | ORAL | Status: DC
  Administered 2011-05-19 – 2011-06-02 (×7): 1 via ORAL

## 2011-05-19 MED ORDER — GABAPENTIN 400 MG PO CAPS
800.0000 mg | ORAL_CAPSULE | Freq: Every day | ORAL | Status: DC
  Administered 2011-05-19 – 2011-06-03 (×16): 800 mg via ORAL
  Filled 2011-05-19 (×17): qty 2

## 2011-05-19 MED ORDER — CLONAZEPAM 0.5 MG PO TABS
0.5000 mg | ORAL_TABLET | Freq: Every day | ORAL | Status: DC
  Administered 2011-05-19: 0.5 mg via ORAL
  Filled 2011-05-19: qty 1

## 2011-05-19 NOTE — Progress Notes (Signed)
Physical Therapy Session Note  Patient Details  Name: Chelsey Gallagher MRN: [id number] Date of Birth: 1950-05-18  Today's Date: 05/19/2011 Time: 1300-1400 Time Calculation (min): 60 min  Short Term Goals: Week 1:  PT Short Term Goal 1 (Week 1): Patient will perform bed mobility with min assist demonstrating typical movement patterns. PT Short Term Goal 1 - Progress (Week 1): Progressing toward goal PT Short Term Goal 2 (Week 1): Patient will consistently transfer with mod assist. PT Short Term Goal 2 - Progress (Week 1): Progressing toward goal PT Short Term Goal 3 (Week 1): Patient will maintain dynamic sitting balance > 10 min with supervision during functional activity. PT Short Term Goal 3 - Progress (Week 1): Progressing toward goal PT Short Term Goal 4 (Week 1): Patient will maintain dynamic standing balance > 10 min with mod assist for dynamic pre-gait training. PT Short Term Goal 4 - Progress (Week 1): Progressing toward goal PT Short Term Goal 5 (Week 1): Patient will propel w/c > 100 feet with min assist in controlled environment, PT Short Term Goal 5 - Progress (Week 1): Progressing toward goal  Therapy Documentation Precautions:  Precautions Precautions: Fall Required Braces or Orthoses: No Restrictions Weight Bearing Restrictions: No Other Position/Activity Restrictions: right hemiplegia, decreased right side sensation, Dys. 3 with thin liquids Pain: Pain Assessment Pain Assessment: No/denies pain Locomotion : Ambulation Ambulation: Yes Ambulation/Gait Assistance: 1: +2 Total assist Ambulation Distance (Feet): 50 Feet Assistive device: 2 person hand held assist Ambulation/Gait Assistance Details: Manual facilitation for weight shifting;Manual facilitation for weight bearing;Verbal cues for gait pattern;Verbal cues for sequencing;Verbal cues for technique Ambulation/Gait Assistance Details (indicate cue type and reason): P&O present to discuss orthotic needs for  ROM and improved positioning and tone control in WB positions; gait with +2A with P&O observing: patient presents with RLE scissoring and unable to achieve heel strike due to R foot inversion, supination and plantarflexion; also presents with genu recurvatum in stance.  Discussed options for AFO for ambulation; still early in patient's rehabilitation and will f/u with P&O once gait and tone more adequately addressed and as patient progresses.     Other Treatments: Treatments Therapeutic Activity: P&O present to discuss orthosis needs to increase and maintain ROM of R ankle; patient performed sit to stand with UE support at sink with manual facilitation at tibia and dorsal foot for foot pronation and closed chain DF to achieve foot flat;  after prolonged WB and single limb stance on RLE R foot tone did relax and patient was able to achieve foot flat position.  P&O to provide a night splint for patient for prolonged stretch and positioning to assit with standing and gait activities.  W/C parts management: shortened bilat leg rests for appropriate support to prevent R foot from hanging in PF position.  See FIM for current functional status  Therapy/Group: Individual Therapy  Edman Circle [facility] 05/19/2011, 4:26 PM

## 2011-05-19 NOTE — Progress Notes (Signed)
Patient ID: Chelsey Gallagher, female   DOB: Apr 18, 1950, 61 y.o.   MRN: [id number]   Subjective/Complaints: Restless legs, Spasms R calf used neurontin at home.  Likes ambien for sleep in hospital  Objective: Vital Signs: Blood pressure 164/88, pulse 91, temperature 98 F (36.7 C), temperature source Oral, resp. rate 19, height 5\' 3"  (1.6 m), SpO2 91.00%. No results found. Results for orders placed during the hospital encounter of 05/14/11 (from the past 72 hour(s))  GLUCOSE, CAPILLARY     Status: Abnormal   Collection Time   05/16/11 11:15 AM      Component Value Range Comment   Glucose-Capillary 146 (*) 70 - 99 (mg/dL)    Comment 1 Notify RN     GLUCOSE, CAPILLARY     Status: Abnormal   Collection Time   05/16/11  4:28 PM      Component Value Range Comment   Glucose-Capillary 128 (*) 70 - 99 (mg/dL)   GLUCOSE, CAPILLARY     Status: Abnormal   Collection Time   05/16/11  8:40 PM      Component Value Range Comment   Glucose-Capillary 135 (*) 70 - 99 (mg/dL)   GLUCOSE, CAPILLARY     Status: Abnormal   Collection Time   05/17/11  7:44 AM      Component Value Range Comment   Glucose-Capillary 119 (*) 70 - 99 (mg/dL)    Comment 1 Notify RN     GLUCOSE, CAPILLARY     Status: Abnormal   Collection Time   05/17/11 11:47 AM      Component Value Range Comment   Glucose-Capillary 185 (*) 70 - 99 (mg/dL)    Comment 1 Notify RN     GLUCOSE, CAPILLARY     Status: Abnormal   Collection Time   05/17/11  5:06 PM      Component Value Range Comment   Glucose-Capillary 108 (*) 70 - 99 (mg/dL)    Comment 1 Notify RN     GLUCOSE, CAPILLARY     Status: Abnormal   Collection Time   05/17/11  8:48 PM      Component Value Range Comment   Glucose-Capillary 163 (*) 70 - 99 (mg/dL)    Comment 1 Notify RN     GLUCOSE, CAPILLARY     Status: Abnormal   Collection Time   05/18/11  7:20 AM      Component Value Range Comment   Glucose-Capillary 120 (*) 70 - 99 (mg/dL)    Comment 1 Notify RN       GLUCOSE, CAPILLARY     Status: Abnormal   Collection Time   05/18/11 11:20 AM      Component Value Range Comment   Glucose-Capillary 153 (*) 70 - 99 (mg/dL)    Comment 1 Notify RN     GLUCOSE, CAPILLARY     Status: Abnormal   Collection Time   05/18/11  5:15 PM      Component Value Range Comment   Glucose-Capillary 170 (*) 70 - 99 (mg/dL)    Comment 1 Notify RN     GLUCOSE, CAPILLARY     Status: Abnormal   Collection Time   05/18/11  9:08 PM      Component Value Range Comment   Glucose-Capillary 175 (*) 70 - 99 (mg/dL)    Comment 1 Notify RN      hysical Exam  Vitals reviewed.  Constitutional: She is oriented to person, place, and time. She appears well-developed.  HENT:  Head: Normocephalic. PERRL, EOMI  Neck: Normal range of motion. Neck supple. No thyromegaly present.  Cardiovascular: Normal rate and regular rhythm.  Pulmonary/Chest: Effort normal and breath sounds normal. She has no wheezes.  Abdominal: Soft. She exhibits no distension. There is no tenderness.  Musculoskeletal: She exhibits no edema.  Neurological: She is alert and oriented to person, place, and time.  She is somewhat hard of hearing (Right more affected than left). She wears hearing aids on either ear. Shows good awareness of deficits. Follow three-step commands.Severely Dysarthric speech. Gaze intact. Poor visual acuity overall. Right facial weakness and lower facial sensory loss with tongue deviation. A few beats of nystagmus with superior gaze and left gaze. RUE 0'/5. RLE 2+ prox to 0/trace distally with ADF and 1+/5 APF. Left side motor and sensory nromal. Sensation 1/2 on right (feels pins and needles). She can sense pain however.  Skin: Skin is warm and dry.  Psychiatric: She has a normal mood and affect.  Mild anxiety  Assessment/Plan: 1. 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 Physiatrist is providing close team supervision and 24 hour management of active medical problems listed below. Physiatrist and rehab team continue to assess barriers to discharge/monitor patient progress toward functional and medical goals. FIM: FIM - Bathing Bathing Steps Patient Completed: Chest;Right Arm;Left Arm;Abdomen;Front perineal area;Left upper leg;Right upper leg Bathing: 4: Min-Patient completes 8-9 80f 10 parts or 75+ percent  FIM - Upper Body Dressing/Undressing Upper body dressing/undressing steps patient completed: Put head through opening of pull over shirt/dress;Thread/unthread left sleeve of pullover shirt/dress;Pull shirt over trunk Upper body dressing/undressing: 4: Min-Patient completed 75 plus % of tasks FIM - Lower Body Dressing/Undressing Lower body dressing/undressing steps patient completed: Thread/unthread right underwear leg;Thread/unthread left underwear leg;Pull underwear up/down;Thread/unthread right pants leg;Thread/unthread left pants leg;Pull pants up/down Lower body dressing/undressing: 3: Mod-Patient completed 50-74% of tasks  FIM - Toileting Toileting steps completed by patient: Adjust clothing prior to toileting;Performs perineal hygiene;Adjust clothing after toileting Toileting Assistive Devices: Grab bar or rail for support Toileting: 2: Max-Patient completed 1 of 3 steps  FIM - Diplomatic Services operational officer Devices: Bedside commode Toilet Transfers: 3-To toilet/BSC: Mod A (lift or lower assist);3-From toilet/BSC: Mod A (lift or lower assist)  FIM - Bed/Chair Transfer Bed/Chair Transfer: 3: Bed > Chair or W/C: Mod A (lift or lower assist);3: Chair or W/C > Bed: Mod A (lift or lower assist)  FIM - Locomotion: Wheelchair Locomotion: Wheelchair: 1: Travels less than 50 ft with minimal assistance (Pt.>75%) FIM - Locomotion: Ambulation Locomotion: Ambulation Assistive Devices:  (bilat hand held assist) Ambulation/Gait  Assistance: 1: +1 Total assist Locomotion: Ambulation: 1: Two helpers  Comprehension Comprehension Mode: Auditory Comprehension: 6-Follows complex conversation/direction: With extra time/assistive device  Expression Expression Mode: Verbal Expression: 6-Expresses complex ideas: With extra time/assistive device  Social Interaction Social Interaction: 5-Interacts appropriately 90% of the time - Needs monitoring or encouragement for participation or interaction.  Problem Solving Problem Solving: 5-Solves complex 90% of the time/cues < 10% of the time  Memory Memory: 6-More than reasonable amt of time   Medical Problem List and Plan:  1. Left medullary and cerebellar infarct  2. DVT Prophylaxis/Anticoagulation: Subcutaneous lovenox. Monitor platelet counts and any signs of bleeding  3. Left neck mandible mass.  4. 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 Plan outpatient followup for revascularization with intervention radiology  5. Diabetes mellitus. Hemoglobin A1c of  10.2. Lantus insulin 15 units at bedtime. Check blood sugars a.c. and at bedtime. Patient on glucophage 1000 mg twice daily prior to admission monitor 6. Hypertension. Lisinopril 40 mg daily. Monitor with increased activity  7. Tobacco abuse. Nicoderm patch.  8. Restless leg syndrome.D/C Requip  pt states she used neurontin at home worse on R due to spasticity.  Start klonopin should help for sleep as well 9. Depression.celexa. Provide emotional support and positive reinforcement  10. Hyperlipidemia. Lipitor  P   LOS (Days) 5 A FACE TO FACE EVALUATION WAS PERFORMED  Sharnetta Gielow E 05/19/2011, 7:39 AM

## 2011-05-19 NOTE — Plan of Care (Signed)
Problem: RH BLADDER ELIMINATION Goal: RH STG MANAGE BLADDER WITH ASSISTANCE STG Manage Bladder With mod independence  Outcome: Not Progressing Toilet Q 3 hr for urgency

## 2011-05-19 NOTE — Progress Notes (Signed)
Speech Language Pathology Daily Session Note  Patient Details  Name: Hiya Point MRN: [id number] Date of Birth: May 30, 1950  Today's Date: 05/19/2011 Time: 0805-0900 Time Calculation (min): 55 min  Short Term Goals: Week 1: SLP Short Term Goal 1 (Week 1): Patient will consume Dys. 3 thin liquid diet without observed clinical signs of aspiration with: Supervision/safety cues for use of strategies SLP Short Term Goal 2 (Week 1): Patient will demonstrae timely mastication of regular textures with minimal assist cues SLP Short Term Goal 3 (Week 1): Patient will demonstrate slow, over articulated speech at the conversational level with minimal assist semantic cues to utilize compensatory strategies SLP Short Term Goal 4 (Week 1): Patient will identify effective thechiques to facilitate her speech intelligibility with moderate assist semantic cues  Skilled Therapeutic Interventions: Patient consumed Dys.3 textures and thin liquids via cup with mod I set up and use of compensatory strategies with cough x2 and patient reports that it felt like it was a smoker's cough not a choking cough; patient with improved ability to self monitor and alternate bites of food with conversation.  Patient's speech is characterized by /s/ and vocalic /r/ errors, which SLP suspects to be baseline and hyponasality at times; however overall in complex functional communication patient is mod I for intelligibility. Patient would benefit from trials of upgraded textures.  Daily Session Precautions/Restrictions    FIM:  Comprehension Comprehension Mode: Auditory Comprehension: 6-Follows complex conversation/direction: With extra time/assistive device Expression Expression Mode: Verbal Expression: 6-Expresses complex ideas: With extra time/assistive device Social Interaction Social Interaction: 6-Interacts appropriately with others with medication or extra time (anti-anxiety, antidepressant). Problem  Solving Problem Solving: 6-Solves complex problems: With extra time Memory Memory: 6-More than reasonable amt of time FIM - Eating Eating Activity: 6: More than reasonable amount of time General    Pain Pain Assessment Pain Assessment: No/denies pain Pain Score: 0-No pain  Therapy/Group: Individual Therapy  Quince Santana 05/19/2011, 9:48 AM

## 2011-05-19 NOTE — Progress Notes (Signed)
Nutrition Follow-up  RN asked this RD to see patient regarding supplements. Pt is not taking Prostat or Ensure Pudding. Intake is much better, pt is eating 90 - 100% of meals.  Diet Order:  Dysphagia 3 with thin liquids Supplements: 30 ml Prostat daily, Ensure Pudding daily  Meds: Scheduled Meds:   . aspirin  81 mg Oral Daily  . atorvastatin  40 mg Oral q1800  . citalopram  20 mg Oral Daily  . clonazePAM  0.5 mg Oral QHS  . enoxaparin  40 mg Subcutaneous Q24H  . feeding supplement  1 Container Oral Daily  . feeding supplement  30 mL Oral Daily  . gabapentin  800 mg Oral QHS  . insulin aspart  0-15 Units Subcutaneous TID WC  . insulin glargine  15 Units Subcutaneous QHS  . lisinopril  40 mg Oral Daily  . nicotine  21 mg Transdermal Daily  . senna  1 tablet Oral BID  . DISCONTD: rOPINIRole  3 mg Oral QHS   Continuous Infusions:  PRN Meds:.acetaminophen, bisacodyl, methocarbamol, ondansetron (ZOFRAN) IV, polyethylene glycol, sorbitol, traZODone  Labs:  CMP     Component Value Date/Time   NA 135 05/15/2011 0650   K 4.1 05/15/2011 0650   CL 97 05/15/2011 0650   CO2 31 05/15/2011 0650   GLUCOSE 141* 05/15/2011 0650   BUN 14 05/15/2011 0650   CREATININE 0.56 05/15/2011 0650   CALCIUM 10.0 05/15/2011 0650   PROT 6.9 05/15/2011 0650   ALBUMIN 3.4* 05/15/2011 0650   AST 27 05/15/2011 0650   ALT 27 05/15/2011 0650   ALKPHOS 95 05/15/2011 0650   BILITOT 0.7 05/15/2011 0650   GFRNONAA >90 05/15/2011 0650   GFRAA >90 05/15/2011 0650   CBG (last 3)   Basename 05/19/11 1135 05/19/11 0731 05/18/11 2108  GLUCAP 184* 127* 175*      Intake/Output Summary (Last 24 hours) at 05/19/11 1546 Last data filed at 05/19/11 1400  Gross per 24 hour  Intake   1440 ml  Output    150 ml  Net   1290 ml    Weight Status:  67.1 kg - no new weight available  Estimated needs:  1550 - 1800 kcal, 80 - 90 grams protein, 1.6 - 1.8 L/d  Nutrition Dx:  Inadequate oral intake r/t poor appetite AEB pt report.  Resolved.  Goal:  Pt to consume >/= 75% of meals and supplements. Met with meals.  Intervention:   1. Obtain new weight as able. 2. Discontinue Prostat and Pudding daily 3. Resource Breeze PO daily 4. RD to follow nutrition care plan  Monitor: weights, labs, PO intake, I/O's  [facility] Pager #:  [phone]

## 2011-05-19 NOTE — Progress Notes (Signed)
Per State Regulation 482.30 This chart was reviewed for medical necessity with respect to the patient's Admission/Duration of stay. Pt participating and progressing in therapies. Incontinent of urine at times due to urgency. Med adjustments. Meryl Dare                 Nurse Care Manager            Next Review Date: 05/23/11

## 2011-05-19 NOTE — Progress Notes (Signed)
Occupational Therapy Session Note  Patient Details  Name: Chelsey Gallagher MRN: [id number] Date of Birth: Jan 24, 1951  Today's Date: 05/19/2011 Time: 0930-1100 Time Calculation (min): 90 min  Short Term Goals: Week 1:  OT Short Term Goal 1 (Week 1): Pt will complete UB dressing with min assist OT Short Term Goal 2 (Week 1): Pt will complete LB dressing with mod assist OT Short Term Goal 3 (Week 1): Pt will complete bathing with min assist OT Short Term Goal 4 (Week 1): Pt will complete toilet transfer with min assist OT Short Term Goal 5 (Week 1): Pt will complete toileting with mod assist (2 of 3 steps)  Skilled Therapeutic Interventions/Progress Updates:    1) Pt seen for ADL retraining with focus on stand pivot transfers, sit <> stand, hemi-technique with bathing and dressing.  Pt requires verbal and tactile cues for Rt foot placement prior to sit <> stand or stand pivot transfers with occasional tactile cues at Rt knee to decrease buckling.  Pt completed dressing at a sit to stand level with assist to stand and steady assist in standing while she completed clothing management.  2) Pt seen for extended session with focus on sit to stand and standing balance without UE support while completing plant watering activity in standing.  Pt required tactile cues at Rt knee in standing with min support at hips to encourage weight shifting.  Weight bearing through RUE on counter.  Pt reports some pain in Lt wrist with sit to stand secondary to increased weight being placed through wrist to push up into standing.  RN notified.  Therapy Documentation Precautions:  Precautions Precautions: Fall Required Braces or Orthoses: No Restrictions Weight Bearing Restrictions: No Other Position/Activity Restrictions: right hemiplegia, decreased right side sensation, Dys. 3 with thin liquids Pain: Pain Assessment Pain Assessment: 0-10 Pain Score:   3 Pain Type: Acute pain Pain Location: Wrist Pain  Orientation: Left Pain Descriptors: Sore;Tightness  See FIM for current functional status  Therapy/Group: Individual Therapy  [name] 05/19/2011, 11:14 AM

## 2011-05-20 LAB — GLUCOSE, CAPILLARY
Glucose-Capillary: 156 mg/dL — ABNORMAL HIGH (ref 70–99)
Glucose-Capillary: 151 mg/dL — ABNORMAL HIGH (ref 70–99)

## 2011-05-20 MED ORDER — CLONAZEPAM 0.5 MG PO TABS
1.0000 mg | ORAL_TABLET | Freq: Every day | ORAL | Status: DC
  Administered 2011-05-20 – 2011-05-25 (×5): 1 mg via ORAL
  Filled 2011-05-20 (×6): qty 2

## 2011-05-20 NOTE — Progress Notes (Signed)
Met with pt and talked at length about smoking cessation. Pt open to education on how to maintain cessation after discharge.  Pt reports that the nicotine patch is helping and that she does want to be successful in quitting. Educated pt on the effects of smoking, especially as related to stroke. Provided handouts. Will follow-up and continue to provide encouragement.

## 2011-05-20 NOTE — Progress Notes (Addendum)
Physical Therapy Session Note  Patient Details  Name: Chelsey Gallagher MRN: [id number] Date of Birth: 1951-01-16  Today's Date: 05/20/2011 Time:0820-0900 and  [id number]  Time Calculation (min): 45 min and 45 min   Short Term Goals: Week 1:  PT Short Term Goal 1 (Week 1): Patient will perform bed mobility with min assist demonstrating typical movement patterns. PT Short Term Goal 1 - Progress (Week 1): Progressing toward goal PT Short Term Goal 2 (Week 1): Patient will consistently transfer with mod assist. PT Short Term Goal 2 - Progress (Week 1): Progressing toward goal PT Short Term Goal 3 (Week 1): Patient will maintain dynamic sitting balance > 10 min with supervision during functional activity. PT Short Term Goal 3 - Progress (Week 1): Progressing toward goal PT Short Term Goal 4 (Week 1): Patient will maintain dynamic standing balance > 10 min with mod assist for dynamic pre-gait training. PT Short Term Goal 4 - Progress (Week 1): Progressing toward goal PT Short Term Goal 5 (Week 1): Patient will propel w/c > 100 feet with min assist in controlled environment, PT Short Term Goal 5 - Progress (Week 1): Progressing toward goal  Skilled Therapeutic Interventions/Progress Updates: 1st treatment:   focused on neuromuscular re-education of R trunk and LE via forced use, manual and VCs, visual feedback from mirror, therapeutic activities for midline orientation, dynamic sitting balance, transfers, pre-gait RLE stance phase components.  Bed> w/c to R  with mod A, stand pivot; w/c>< mat to L and R with mod A squat pivot.  W/c mobility using hemi technique with LUE and LLE x 150' with min a for steering.  Super hemi ht w/c issued this AM to pt for better positioning for self-propulsion. Sitting on mat, pt worked on R and L, and posterior lateral leans, and trunk shortening/lengthening with wt  shifts for trunk activation to return to midline.  LOB x 1 to R requiring max A.  Pre-gait activities  in standing with mirror, working on R knee control during static standing, mini-squats and  L stepping, with mod-max A. 2nd treatment: Co-treatment for part of session with Clinical Specialist for application and use of Bioness NMES for RLE ankle DF.  In sitting, focused on balanced DF without excessive inversion.  Pt eventually able to demonstrate balanced ankle DF, but with hamstring activated as well, eliciting knee flexion.  Transfer training x 2 super hemi w/c>< mat, focusing on sequencing and = WBing through bil LEs while extending hips to stand; mod A overall to L or R.  Pt demonstrated improved eccentric control during stand> sit in low w/c, given tactile and VCs.  W/c mobility back to room with improved steering capabilities in lower w/c, using hemi method. Neuromuscular re-education for RLE ankle DF via forced use, manual and VCs; +2 gait wearing Bioness 300 (calf only) x 10' demonstrating limited R hip and knee flexion, L knee instability with hyperextension.  In parallel bars, wt shifting with L forward/backward stepping for RLE stance phase components neuromuscular re-education.       Therapy Documentation Precautions:  Precautions Precautions: Fall Required Braces or Orthoses: No Restrictions Weight Bearing Restrictions: No Other Position/Activity Restrictions: right hemiplegia, decreased right side sensation, Dys. 3 with thin liquids  Pain: Pain Assessment Pain Assessment: No/denies pain Mobility:   Locomotion :     See FIM for current functional status  Therapy/Group: Individual Therapy  Karelly Dewalt 05/20/2011, 12:23 PM

## 2011-05-20 NOTE — Progress Notes (Signed)
Speech Language Pathology Daily Session Note  Patient Details  Name: Chelsey Gallagher MRN: [id number] Date of Birth: 1950-08-09  Today's Date: 05/20/2011 Time: 1330-1415 Time Calculation (min): 45 min  Short Term Goals:  SLP Short Term Goal 1 (Week 1): Patient will consume Dys. 3 thin liquid diet without observed clinical signs of aspiration with: Supervision/safety cues for use of strategies SLP Short Term Goal 2 (Week 1): Patient will demonstrae timely mastication of regular textures with minimal assist cues SLP Short Term Goal 3 (Week 1): Patient will demonstrate slow, over articulated speech at the conversational level with minimal assist semantic cues to utilize compensatory strategies SLP Short Term Goal 4 (Week 1): Patient will identify effective thechiques to facilitate her speech intelligibility with moderate assist semantic cues  Skilled Therapeutic Interventions: Treatment focus on speech intelligibility, anticipatory awareness and complex problem solving. Pt Mod I for speech intelligibility during conversational speech but required supervision verbal cues for redirection. Pt demonstrated anticipatory awareness by asking clinician information about signs/symptoms of a stroke. Pt also educated on risk factors for a stroke. Pt verbalized understanding of all information presented.    Daily Session FIM:  Comprehension Comprehension Mode: Auditory Comprehension: 6-Follows complex conversation/direction: With extra time/assistive device Expression Expression Mode: Verbal Expression: 6-Expresses complex ideas: With extra time/assistive device Social Interaction Social Interaction: 5-Interacts appropriately 90% of the time - Needs monitoring or encouragement for participation or interaction. Problem Solving Problem Solving: 5-Solves basic 90% of the time/requires cueing < 10% of the time Memory Memory: 6-More than reasonable amt of time  Pain Pain Assessment Pain Assessment:  No/denies pain  Therapy/Group: Individual Therapy  Natashia Roseman 05/20/2011, 2:23 PM

## 2011-05-20 NOTE — Progress Notes (Signed)
Physical Therapy Session Note  Patient Details  Name: Chelsey Gallagher MRN: [id number] Date of Birth: 1951-02-07  Today's Date: 05/20/2011 Time: 0900-0930 Time Calculation (min): 30 min  Short Term Goals: Week 1:  PT Short Term Goal 1 (Week 1): Patient will perform bed mobility with min assist demonstrating typical movement patterns. PT Short Term Goal 1 - Progress (Week 1): Progressing toward goal PT Short Term Goal 2 (Week 1): Patient will consistently transfer with mod assist. PT Short Term Goal 2 - Progress (Week 1): Progressing toward goal PT Short Term Goal 3 (Week 1): Patient will maintain dynamic sitting balance > 10 min with supervision during functional activity. PT Short Term Goal 3 - Progress (Week 1): Progressing toward goal PT Short Term Goal 4 (Week 1): Patient will maintain dynamic standing balance > 10 min with mod assist for dynamic pre-gait training. PT Short Term Goal 4 - Progress (Week 1): Progressing toward goal PT Short Term Goal 5 (Week 1): Patient will propel w/c > 100 feet with min assist in controlled environment, PT Short Term Goal 5 - Progress (Week 1): Progressing toward goal  Skilled Therapeutic Interventions/Progress Updates:    Bioness to ankle on training mode to facilitate dorsiflexion and stretch plantar flexors, encouraged LAQ while stimulation on 10 sec on, 10 sec off, progressed to standing weight shifting and pregait.  Therapy Documentation Precautions:  Precautions Precautions: Fall Required Braces or Orthoses: No Restrictions Weight Bearing Restrictions: No Other Position/Activity Restrictions: right hemiplegia, decreased right side sensation, Dys. 3 with thin liquids Pain: Pain Assessment Pain Assessment: No/denies pain Mobility:  propelled lower w/c with S 150 ft, mod A to stand from lower seat with facilitation to keep trunk forward and R femur from [facility] FIM for current functional status  Therapy/Group: Individual Therapy  Michaelene Song 05/20/2011, 12:14 PM

## 2011-05-20 NOTE — Progress Notes (Signed)
Patient ID: Chelsey Gallagher, female   DOB: 07-06-1950, 61 y.o.   MRN: [id number]   Subjective/Complaints: Restless legs, Spasms R calf used neurontin at home. Sleep no better yesterdayObjective: Vital Signs: Blood pressure 121/65, pulse 83, temperature 98.6 F (37 C), temperature source Oral, resp. rate 20, height 5\' 3"  (1.6 m), SpO2 92.00%. No results found. Results for orders placed during the hospital encounter of 05/14/11 (from the past 72 hour(s))  GLUCOSE, CAPILLARY     Status: Abnormal   Collection Time   05/17/11 11:47 AM      Component Value Range Comment   Glucose-Capillary 185 (*) 70 - 99 (mg/dL)    Comment 1 Notify RN     GLUCOSE, CAPILLARY     Status: Abnormal   Collection Time   05/17/11  5:06 PM      Component Value Range Comment   Glucose-Capillary 108 (*) 70 - 99 (mg/dL)    Comment 1 Notify RN     GLUCOSE, CAPILLARY     Status: Abnormal   Collection Time   05/17/11  8:48 PM      Component Value Range Comment   Glucose-Capillary 163 (*) 70 - 99 (mg/dL)    Comment 1 Notify RN     GLUCOSE, CAPILLARY     Status: Abnormal   Collection Time   05/18/11  7:20 AM      Component Value Range Comment   Glucose-Capillary 120 (*) 70 - 99 (mg/dL)    Comment 1 Notify RN     GLUCOSE, CAPILLARY     Status: Abnormal   Collection Time   05/18/11 11:20 AM      Component Value Range Comment   Glucose-Capillary 153 (*) 70 - 99 (mg/dL)    Comment 1 Notify RN     GLUCOSE, CAPILLARY     Status: Abnormal   Collection Time   05/18/11  5:15 PM      Component Value Range Comment   Glucose-Capillary 170 (*) 70 - 99 (mg/dL)    Comment 1 Notify RN     GLUCOSE, CAPILLARY     Status: Abnormal   Collection Time   05/18/11  9:08 PM      Component Value Range Comment   Glucose-Capillary 175 (*) 70 - 99 (mg/dL)    Comment 1 Notify RN     GLUCOSE, CAPILLARY     Status: Abnormal   Collection Time   05/19/11  7:31 AM      Component Value Range Comment   Glucose-Capillary 127 (*) 70 - 99  (mg/dL)    Comment 1 Notify RN     GLUCOSE, CAPILLARY     Status: Abnormal   Collection Time   05/19/11 11:35 AM      Component Value Range Comment   Glucose-Capillary 184 (*) 70 - 99 (mg/dL)    Comment 1 Notify RN     GLUCOSE, CAPILLARY     Status: Abnormal   Collection Time   05/19/11  4:31 PM      Component Value Range Comment   Glucose-Capillary 144 (*) 70 - 99 (mg/dL)   GLUCOSE, CAPILLARY     Status: Abnormal   Collection Time   05/19/11  9:11 PM      Component Value Range Comment   Glucose-Capillary 207 (*) 70 - 99 (mg/dL)    Comment 1 Notify RN      hysical Exam  Vitals reviewed.  Constitutional: She is oriented to person, place, and time. She appears  well-developed.  HENT:  Head: Normocephalic. PERRL, EOMI  Neck: Normal range of motion. Neck supple. No thyromegaly present.  Cardiovascular: Normal rate and regular rhythm.  Pulmonary/Chest: Effort normal and breath sounds normal. She has no wheezes.  Abdominal: Soft. She exhibits no distension. There is no tenderness.  Musculoskeletal: She exhibits no edema.  Neurological: She is alert and oriented to person, place, and time.  She is somewhat hard of hearing (Right more affected than left). She wears hearing aids on either ear. Shows good awareness of deficits. Follow three-step commands.Severely Dysarthric speech. Gaze intact. Poor visual acuity overall. Right facial weakness and lower facial sensory loss with tongue deviation. A few beats of nystagmus with superior gaze and left gaze. RUE 0'/5. RLE 2+ prox to 0/trace distally with ADF and 1+/5 APF. Left side motor and sensory nromal. Sensation 1/2 on right (feels pins and needles). She can sense pain however.  Skin: Skin is warm and dry.  Psychiatric: She has a normal mood and affect.  Mild anxiety  Assessment/Plan: 1. 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 Physiatrist is providing close team supervision and 24 hour management of active medical problems listed below. Physiatrist and rehab team continue to assess barriers to discharge/monitor patient progress toward functional and medical goals. FIM: FIM - Bathing Bathing Steps Patient Completed: Chest;Right Arm;Abdomen;Front perineal area;Buttocks;Right upper leg;Left upper leg;Right lower leg (including foot);Left lower leg (including foot) Bathing: 4: Min-Patient completes 8-9 62f 10 parts or 75+ percent  FIM - Upper Body Dressing/Undressing Upper body dressing/undressing steps patient completed: Thread/unthread right bra strap;Thread/unthread left bra strap;Thread/unthread right sleeve of pullover shirt/dresss;Thread/unthread left sleeve of pullover shirt/dress;Put head through opening of pull over shirt/dress;Pull shirt over trunk Upper body dressing/undressing: 4: Min-Patient completed 75 plus % of tasks FIM - Lower Body Dressing/Undressing Lower body dressing/undressing steps patient completed: Thread/unthread right underwear leg;Thread/unthread left underwear leg;Pull underwear up/down;Thread/unthread right pants leg;Thread/unthread left pants leg;Pull pants up/down;Don/Doff right sock;Don/Doff left sock Lower body dressing/undressing: 4: Min-Patient completed 75 plus % of tasks  FIM - Toileting Toileting steps completed by patient: Performs perineal hygiene Toileting Assistive Devices: Grab bar or rail for support Toileting: 2: Max-Patient completed 1 of 3 steps  FIM - Diplomatic Services operational officer Devices: Grab bars Toilet Transfers: 3-To toilet/BSC: Mod A (lift or lower assist);3-From toilet/BSC: Mod A (lift or lower assist)  FIM - Bed/Chair Transfer Bed/Chair Transfer: 2: Bed > Chair or W/C: Max A (lift and lower assist);2: Chair or W/C > Bed: Max A (lift and lower assist)  FIM - Locomotion: Wheelchair Distance: 150 Locomotion: Wheelchair: 1:  Total Assistance/staff pushes wheelchair (Pt<25%) FIM - Locomotion: Ambulation Locomotion: Ambulation Assistive Devices: Other (comment) (bilat HHA) Ambulation/Gait Assistance: 1: +2 Total assist Locomotion: Ambulation: 1: Two helpers  Comprehension Comprehension Mode: Auditory Comprehension: 6-Follows complex conversation/direction: With extra time/assistive device  Expression Expression Mode: Verbal Expression: 6-Expresses complex ideas: With extra time/assistive device  Social Interaction Social Interaction: 6-Interacts appropriately with others with medication or extra time (anti-anxiety, antidepressant).  Problem Solving Problem Solving: 6-Solves complex problems: With extra time  Memory Memory: 6-More than reasonable amt of time   Medical Problem List and Plan:  1. 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 DVT Prophylaxis/Anticoagulation: Subcutaneous lovenox. Monitor platelet counts and any signs of bleeding  3. Left neck mandible mass.  4. 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 Plan outpatient followup for revascularization  with intervention radiology  5. Diabetes mellitus. Hemoglobin A1c of 10.2. Lantus insulin 15 units at bedtime. Check blood sugars a.c. and at bedtime. Patient on glucophage 1000 mg twice daily prior to admission monitor 6. Hypertension. Lisinopril 40 mg daily. Monitor with increased activity  7. Tobacco abuse. Nicoderm patch.  8. Restless leg syndrome.D/C Requip  pt states she used neurontin at home worse on R due to spasticity.  Start klonopin should help for sleep as well increase dose 9. Depression.celexa. Provide emotional support and positive reinforcement  10. Hyperlipidemia. Lipitor  P   LOS (Days) 6 A FACE TO FACE EVALUATION WAS PERFORMED  Kyeshia Zinn E 05/20/2011, 7:44 AM

## 2011-05-20 NOTE — Progress Notes (Signed)
Occupational Therapy Session Note  Patient Details  Name: Chelsey Gallagher MRN: [id number] Date of Birth: 1951-02-24  Today's Date: 05/20/2011 Time: 0930-1030 Time Calculation (min): 60 min  Short Term Goals: Week 1:  OT Short Term Goal 1 (Week 1): Pt will complete UB dressing with min assist OT Short Term Goal 2 (Week 1): Pt will complete LB dressing with mod assist OT Short Term Goal 3 (Week 1): Pt will complete bathing with min assist OT Short Term Goal 4 (Week 1): Pt will complete toilet transfer with min assist OT Short Term Goal 5 (Week 1): Pt will complete toileting with mod assist (2 of 3 steps)  Skilled Therapeutic Interventions/Progress Updates:    Pt seen for ADL retraining at room shower level with focus on transfers, sit <> stand, and standing balance with LB bathing and dressing.  Pt continues to require placement of RLE prior to standing with occasional manual facilitation at Rt knee for appropriate placement, to decrease buckling, and to promote stretch through Rt heel.  Pt required heavy mod assist with sit to stand from new (lower) w/c.  Encouraged weight bearing through RUE in standing at sink and natural/functional placement when not in use.  Therapy Documentation Precautions:  Precautions Precautions: Fall Required Braces or Orthoses: No Restrictions Weight Bearing Restrictions: No Other Position/Activity Restrictions: right hemiplegia, decreased right side sensation, Dys. 3 with thin liquids Pain: Pain Assessment Pain Assessment: No/denies pain  See FIM for current functional status  Therapy/Group: Individual Therapy  [name] 05/20/2011, 10:47 AM

## 2011-05-20 NOTE — Progress Notes (Signed)
Social Work Patient ID: Chelsey Gallagher, female   DOB: 1950/12/20, 61 y.o.   MRN: [id number]  Met with pt to discuss how she is doing.  She feels she is making progress and can see it.  This is encouraging To her. Gave her hand out on assistance with your hospital bill, since pt expressed concerns regarding paying for  Her bill at discharge.  She is aware team conference tomorrow.  She expressed no concerns, just that she does not want To go home too soon, so she can be as high level as possible.  She wants to be as independent as possible by discharge, since She is still formulating a discharge plan.  Continue to work on discharge plans and meet with tomorrow after team conference.

## 2011-05-21 LAB — GLUCOSE, CAPILLARY

## 2011-05-21 LAB — URINALYSIS, ROUTINE W REFLEX MICROSCOPIC
Glucose, UA: 250 mg/dL — AB
Hgb urine dipstick: NEGATIVE
Protein, ur: NEGATIVE mg/dL

## 2011-05-21 NOTE — Progress Notes (Signed)
Occupational Therapy Session Note  Patient Details  Name: Chelsey Gallagher MRN: [id number] Date of Birth: 07-30-1950  Today's Date: 05/21/2011 Time: 1115-1200 Time Calculation (min): 45 min  Short Term Goals: Week 1:  OT Short Term Goal 1 (Week 1): Pt will complete UB dressing with min assist OT Short Term Goal 2 (Week 1): Pt will complete LB dressing with mod assist OT Short Term Goal 3 (Week 1): Pt will complete bathing with min assist OT Short Term Goal 4 (Week 1): Pt will complete toilet transfer with min assist OT Short Term Goal 5 (Week 1): Pt will complete toileting with mod assist (2 of 3 steps)  Skilled Therapeutic Interventions/Progress Updates:    Pt seen for ADL retraining with focus on transfers to/from various surfaces.  Engaged in stand pivot transfer to/from couch and tub bench in tub/shower.  Pt requires increased assist with sit to stand secondary to new, lower w/c which puts increased strain on BLEs to stand and shorter arm rest with decreased assist to push up. Once up in standing from w/c, pt able to pivot and sit on other surface.  Discussed with SW getting a hemi-height w/c to decrease burden with transfers, as pt was able to assist more 2 days ago with different w/c.  Tub bench transfer into tub/shower with mod assist and lifting of Rt leg over ledge.  Educated pt on appropriate DME for d/c and suggested tub bench for tub/shower and either installing a grab bar or getting a temporary grab bar.  Therapy Documentation Precautions:  Precautions Precautions: Fall Required Braces or Orthoses: Yes Other Brace/Splint: R plantar flexion dynamic stretching brace, worn at night Restrictions Weight Bearing Restrictions: No Other Position/Activity Restrictions: right hemiplegia, decreased right side sensation, Dys. 3 with thin liquids Vital Signs: Therapy Vitals Temp: 98.6 F (37 C) Temp src: Oral Pulse Rate: 92  Resp: 18  BP: 126/70 mmHg Patient Position, if  appropriate: Lying Oxygen Therapy SpO2: 88 % O2 Device: None (Room air) Pain: Pain Assessment Pain Assessment: No/denies pain Pain Score: 0-No pain  See FIM for current functional status  Therapy/Group: Individual Therapy  [name] 05/21/2011, 12:05 PM

## 2011-05-21 NOTE — Progress Notes (Addendum)
Physical Therapy Session Note  Patient Details  Name: Chelsey Gallagher MRN: [id number] Date of Birth: [birth year]  Today's Date: 05/21/2011 Time: 1300-1405 and 0830-0922 Time Calculation (min): 65 min and 52 min Short Term Goals: Week 1:  PT Short Term Goal 1 (Week 1): Patient will perform bed mobility with min assist demonstrating typical movement patterns. PT Short Term Goal 1 - Progress (Week 1): Progressing toward goal PT Short Term Goal 2 (Week 1): Patient will consistently transfer with mod assist. PT Short Term Goal 2 - Progress (Week 1): Progressing toward goal PT Short Term Goal 3 (Week 1): Patient will maintain dynamic sitting balance > 10 min with supervision during functional activity. PT Short Term Goal 3 - Progress (Week 1): Progressing toward goal PT Short Term Goal 4 (Week 1): Patient will maintain dynamic standing balance > 10 min with mod assist for dynamic pre-gait training. PT Short Term Goal 4 - Progress (Week 1): Progressing toward goal PT Short Term Goal 5 (Week 1): Patient will propel w/c > 100 feet with min assist in controlled environment, PT Short Term Goal 5 - Progress (Week 1): Progressing toward goal     Skilled Therapeutic Interventions/Progress Updates:  AM treatment: Pt stated that she was wet. Neuromuscular re-education via VCs, tactile cues during rolling side to side to assist with hygiene, clothing mgt.  Bed mobility min A.  Sitting EOB, donned pants and socks with min A throughout for dynamic sitting balance.  Stand-step transfer bed> low w/c to R with mod A, focusing on stepping with R foot rather than twisting.  W/c mobility in super hemi ht. x 157' with distant supervision, using hemi technique with LUE and LLE.  Sit> stand from low w/c to stairs with rail, with max A.  Neuromuscular re-education for RLE hip and knee  flexion sufficient to place foot on 5" high step, x 12 reps; requiring A for 6 reps.  Pt demonstrates hip rotator weakness once foot is  placed on step.  Standing mini-squats x 10 focusing on R knee control.    PM treatment:  Neuromuscular re-education as above for trunk activation, improving with R trunk shortening/lengthening, flexion/extension, and R shoulder adduction with visual feedback, demo and tactile cues.  Stretched R hamstrings, heel cord before fitting R plantar flexion dynamic brace.  MD was concerned that heel is not captured by this brace.  Fitted brace better by tightening ankle and dorsal foot strap; demonstrated to husband.  Hypertonus overnight probably causes heel to lift up in brace; cautioned pt and husband that she should wear a thick sock and watch for redness on top of foot.  Pre-gait standing while wt shifting, working on R knee control.  Gait training with Carley Hammed +2 for steering, pt performing around 50% of effort, x 35', x 25', with ACE wrap on R foot to control PF hypertonus/Df weakness.  Changed w/c to 1 arm drive with supportive Vonna Kotyk cushion for higher seat, ease of transfers . Pt required min A for steering when propelling 100' using L UE only.   Discussed fit of brace by phone with [name], CPO.  He concurred with tightening straps, use of thick sock, watching for pressure.   Later, discussed and demonstrated donning of night brace with Marcelino Duster, RN; stretching forefoot up before dropping heel down into brace, and watching for red marks in AM.       Therapy Documentation Precautions:  Precautions Precautions: Fall Required Braces or Orthoses: Yes Other Brace/Splint: straps tightened to better  capture calcaneus Restrictions Weight Bearing Restrictions: No Other Position/Activity Restrictions: right hemiplegia, decreased right side sensation, Dys. 3 with thin liquids  Pain: Pain Assessment Pain Assessment: No/denies pain AM and PM Pain Score: 0-No pain     See FIM for current functional status  Therapy/Group: Individual Therapy  Chelsey Gallagher 05/21/2011, 3:23 PM

## 2011-05-21 NOTE — Progress Notes (Signed)
Social Work Patient ID: Chelsey Gallagher, female   DOB: [birth year], 61 y.o.   MRN: [id number]  Met with pt to inform team conference-goals-supervision-min level.  She is discussing with Ex-husband which home to go too.  She wants to go to her own since it is all one level.  He has been In to observe in some therapies.  She felt the discharge date was long, but after discussing feels she has a lot Of work to do.  Will talk with PT about switching out the wheelchair she is using since it is too low for her to get up from. Will ask Caroline-PT about a hemi height wheelchair for pt to try.  Pt is pleased with the progress she has made thus far But expects to make much more. Continue to work toward discharge.

## 2011-05-21 NOTE — Plan of Care (Signed)
Problem: RH BLADDER ELIMINATION Goal: RH STG MANAGE BLADDER WITH ASSISTANCE STG Manage Bladder With mod independence  Outcome: Not Progressing Incontinent x 1 today in depends

## 2011-05-21 NOTE — Progress Notes (Signed)
 Patient ID: Chelsey Gallagher, female   DOB: 12/17/1950, 61 y.o.   MRN: [id number]  Subjective/Complaints: Restless legs, Spasms R calf used neurontin at home. Sleep better yesterday on 1mg  Klonopin.  Frequent urination Review of Systems  Genitourinary: Positive for urgency and frequency. Negative for dysuria.  Neurological: Positive for focal weakness.  Psychiatric/Behavioral: The patient has insomnia.   All other systems reviewed and are negative.     Objective: Vital Signs: Blood pressure 117/74, pulse 98, temperature 97.6 F (36.4 C), temperature source Oral, resp. rate 20, height 5\' 3"  (1.6 m), SpO2 93.00%. No results found. Results for orders placed during the hospital encounter of 05/14/11 (from the past 72 hour(s))  GLUCOSE, CAPILLARY     Status: Abnormal   Collection Time   05/18/11 11:20 AM      Component Value Range Comment   Glucose-Capillary 153 (*) 70 - 99 (mg/dL)    Comment 1 Notify RN     GLUCOSE, CAPILLARY     Status: Abnormal   Collection Time   05/18/11  5:15 PM      Component Value Range Comment   Glucose-Capillary 170 (*) 70 - 99 (mg/dL)    Comment 1 Notify RN     GLUCOSE, CAPILLARY     Status: Abnormal   Collection Time   05/18/11  9:08 PM      Component Value Range Comment   Glucose-Capillary 175 (*) 70 - 99 (mg/dL)    Comment 1 Notify RN     GLUCOSE, CAPILLARY     Status: Abnormal   Collection Time   05/19/11  7:31 AM      Component Value Range Comment   Glucose-Capillary 127 (*) 70 - 99 (mg/dL)    Comment 1 Notify RN     GLUCOSE, CAPILLARY     Status: Abnormal   Collection Time   05/19/11 11:35 AM      Component Value Range Comment   Glucose-Capillary 184 (*) 70 - 99 (mg/dL)    Comment 1 Notify RN     GLUCOSE, CAPILLARY     Status: Abnormal   Collection Time   05/19/11  4:31 PM      Component Value Range Comment   Glucose-Capillary 144 (*) 70 - 99 (mg/dL)   GLUCOSE, CAPILLARY     Status: Abnormal   Collection Time   05/19/11  9:11 PM   Component Value Range Comment   Glucose-Capillary 207 (*) 70 - 99 (mg/dL)    Comment 1 Notify RN     GLUCOSE, CAPILLARY     Status: Abnormal   Collection Time   05/20/11  7:25 AM      Component Value Range Comment   Glucose-Capillary 156 (*) 70 - 99 (mg/dL)    Comment 1 Notify RN     GLUCOSE, CAPILLARY     Status: Abnormal   Collection Time   05/20/11 11:33 AM      Component Value Range Comment   Glucose-Capillary 151 (*) 70 - 99 (mg/dL)    Comment 1 Notify RN     GLUCOSE, CAPILLARY     Status: Abnormal   Collection Time   05/20/11  5:17 PM      Component Value Range Comment   Glucose-Capillary 165 (*) 70 - 99 (mg/dL)    Comment 1 Notify RN     GLUCOSE, CAPILLARY     Status: Abnormal   Collection Time   05/20/11  9:18 PM      Component Value Range  Comment   Glucose-Capillary 170 (*) 70 - 99 (mg/dL)    Comment 1 Notify RN      hysical Exam  Vitals reviewed.  Constitutional: She is oriented to person, place, and time. She appears well-developed.  HENT:  Head: Normocephalic. PERRL, EOMI  Neck: Normal range of motion. Neck supple. No thyromegaly present.  Cardiovascular: Normal rate and regular rhythm.  Pulmonary/Chest: Effort normal and breath sounds normal. She has no wheezes.  Abdominal: Soft. She exhibits no distension. There is no tenderness.  Musculoskeletal: She exhibits no edema.  Neurological: She is alert and oriented to person, place, and time.  She is somewhat hard of hearing (Right more affected than left). She wears hearing aids on either ear. Shows good awareness of deficits. Follow three-step commands.Severely Dysarthric speech. Gaze intact. Poor visual acuity overall. Right facial weakness and lower facial sensory loss with tongue deviation. A few beats of nystagmus with superior gaze and left gaze. RUE 0'/5. RLE 2+ prox to 0/trace distally with ADF and 1+/5 APF. Left side motor and sensory nromal. Sensation 1/2 on right (feels pins and needles). She can sense pain  however.  Skin: Skin is warm and dry.  Psychiatric: She has a normal mood and affect.  Mild anxiety  Assessment/Plan: 1. 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 Physiatrist is providing close team supervision and 24 hour management of active medical problems listed below. Physiatrist and rehab team continue to assess barriers to discharge/monitor patient progress toward functional and medical goals. FIM: FIM - Bathing Bathing Steps Patient Completed: Chest;Right Arm;Abdomen;Front perineal area;Buttocks;Right upper leg;Left upper leg;Right lower leg (including foot);Left lower leg (including foot) Bathing: 4: Min-Patient completes 8-9 57f 10 parts or 75+ percent  FIM - Upper Body Dressing/Undressing Upper body dressing/undressing steps patient completed: Thread/unthread right sleeve of pullover shirt/dresss;Thread/unthread left sleeve of pullover shirt/dress;Put head through opening of pull over shirt/dress;Pull shirt over trunk Upper body dressing/undressing: 5: Supervision: Safety issues/verbal cues FIM - Lower Body Dressing/Undressing Lower body dressing/undressing steps patient completed: Thread/unthread right underwear leg;Thread/unthread left underwear leg;Thread/unthread right pants leg;Thread/unthread left pants leg;Don/Doff right sock;Don/Doff left sock Lower body dressing/undressing: 4: Min-Patient completed 75 plus % of tasks  FIM - Toileting Toileting steps completed by patient: Performs perineal hygiene Toileting Assistive Devices: Grab bar or rail for support Toileting: 2: Max-Patient completed 1 of 3 steps  FIM - Diplomatic Services operational officer Devices: Therapist, music Transfers: 3-To toilet/BSC: Mod A (lift or lower assist)  FIM - Banker Devices: Bed rails Bed/Chair Transfer: 3: Chair  or W/C > Bed: Mod A (lift or lower assist)  FIM - Locomotion: Wheelchair Distance: 150 Locomotion: Wheelchair: 4: Travels 150 ft or more: maneuvers on rugs and over door sillls with minimal assistance (Pt.>75%) (150 ft once in lower height chair for foot propulsion) FIM - Locomotion: Ambulation Locomotion: Ambulation Assistive Devices: Other (comment) (bilat HHA) Ambulation/Gait Assistance: 1: +2 Total assist Locomotion: Ambulation: 1: Two helpers (5 ft)  Comprehension Comprehension Mode: Auditory Comprehension: 6-Follows complex conversation/direction: With extra time/assistive device  Expression Expression Mode: Verbal Expression: 5-Expresses basic needs/ideas: With no assist  Social Interaction Social Interaction: 6-Interacts appropriately with others with medication or extra time (anti-anxiety, antidepressant).  Problem Solving Problem Solving: 5-Solves basic problems: With no assist  Memory Memory: 6-More than reasonable amt of time   Medical Problem List and Plan:  1. 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 DVT Prophylaxis/Anticoagulation: Subcutaneous lovenox. Monitor platelet counts and any signs of bleeding  3. Left neck mandible mass.  4. 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 Plan outpatient followup for revascularization with intervention radiology  5. Diabetes mellitus. Hemoglobin A1c of 10.2. Lantus insulin 15 units at bedtime. Check blood sugars a.c. and at bedtime. Patient on glucophage 1000 mg twice daily prior to admission monitor 6. Hypertension. Lisinopril 40 mg daily. Monitor with increased activity  7. Tobacco abuse. Nicoderm patch.  8. Restless leg syndrome.D/C Requip  pt states she used neurontin at home worse on R due to spasticity.  Start klonopin should help for sleep as well increase dose 9. Depression.celexa. Provide emotional support and positive reinforcement  10. Hyperlipidemia. Lipitor 11.  Urinary freq ?UTI or UMN bladder P    LOS (Days) 7 A FACE TO FACE EVALUATION WAS PERFORMED  , E 05/21/2011, 8:08 AM

## 2011-05-21 NOTE — Patient Care Conference (Signed)
Inpatient RehabilitationTeam Conference Note Date: 05/21/2011   Time: 10:40 AM    Patient Name: Chelsey Gallagher      Medical Record Number: [id number]  Date of Birth: 1950-06-01 Sex: Female         Room/Bed: 4031/4031-01 Payor Info: Payor: [employer]UE [employer]  Plan: BLUE MEDICARE  Product Type: *No Product type*     Admitting Diagnosis: l CVA  Admit Date/Time:  05/14/2011  3:42 PM Admission Comments: No comment available   Primary Diagnosis:  Stroke Principal Problem: Stroke  Patient Active Problem List  Diagnoses Date Noted  . Stroke 05/15/2011  . Hyperlipidemia 05/14/2011  . Tobacco abuse 05/14/2011  . 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 Lung abnormality 05/11/2011  . CVA (cerebral infarction) 05/08/2011  . Hypertension   . Diabetes mellitus     Expected Discharge Date: Expected Discharge Date: 06/04/11  Team Members Present: Physician: Dr. Claudette Laws Case Manager Present: Lutricia Horsfall, RN Social Worker Present: Dossie Der, LCSW Nurse Present: Gregor Hams, RN PT Present: Edman Circle, PT;Becky [place], PT OT Present: [name], OT SLP Present: Fae Pippin, SLP PPS Coord: Tora Duck, RN    Current Status/Progress Goal Weekly Team Focus  Medical   Minimal improvement in right lower extremity neurologic function, spasticity combined with restless leg syndrome  Adjust medications for spasticity improve sleep  See above   Bowel/Bladder   Incontinent of Bladder; Continent of Bowel  Continent of Bowel and Bladder  Timed Toileting Q 3 hr and as needed   Swallow/Nutrition/ Hydration   regular textures and thin liquids with intermittent supervision  least restrictive p.o. intake   trials of thin via straw and medicaiton with thin liquids   ADL's   min assist bathing, min assist UB and LB dressing, mod assist shower and toilet transfers, max assist toileting  supervision overall,  min assist toilet and walk-in shower transfer   transfers, RUE NM re-ed, hemi-technique   Mobility   mod A basic transfers; supervision super hemi ht w/c mobility x 150' ; pre-gait  mod A 25' gait in controlled environment; mod A up/down 4 steps      Communication   supervision-Mod I  Mod I  Carryover of compensatory strategies   Safety/Cognition/ Behavioral Observations            Pain   NA  NA  NA   Skin     n/a           *See Interdisciplinary Assessment and Plan and progress notes for long and short-term goals  Barriers to Discharge: Heavy physical assistance level    Possible Resolutions to Barriers:  Continue therapy along with family training    Discharge Planning/Teaching Needs:  Still formulating a discharge-aware will need 24 hour care-asking friend or can go and stay with ex-husband if necessary      Team Discussion:  Discussion of dx, hx, goals, d/c plan. Biopsy was negative. Minimal return to R side. Having spasms form both stroke and RLS -- on Klonopin. Urinary urgency at night and ? Spastic bladder causing incontinence at night. Has anti-footdrop splint for noc. Diet upgraded to regular texture. Diet changed to mod carb modified. Pt will need education on DM, CBG checks and insulin. Discharge plan is not certain at this point.  Revisions to Treatment Plan:  none   Continued Need for Acute Rehabilitation Level of Care: The patient requires daily medical management by a physician with specialized training in physical medicine and rehabilitation  for the following conditions: Daily direction of a multidisciplinary physical rehabilitation program to ensure safe treatment while eliciting the highest outcome that is of practical value to the patient.: Yes Daily medical management of patient stability for increased activity during participation in an intensive rehabilitation regime.: Yes Daily analysis of laboratory values and/or radiology reports with any subsequent need for medication adjustment of medical intervention  for : Neurological problems  Meryl Dare 05/21/2011, 12:35 PM

## 2011-05-21 NOTE — Progress Notes (Signed)
Speech Language Pathology Daily Session Note  Patient Details  Name: Chelsey Gallagher MRN: [id number] Date of Birth: 1950/12/04  Today's Date: 05/21/2011 Time: 0915-1000 Time Calculation (min): 45 min  Short Term Goals: Week 1: SLP Short Term Goal 1 (Week 1): Patient will consume Dys. 3 thin liquid diet without observed clinical signs of aspiration with: Supervision/safety cues for use of strategies SLP Short Term Goal 2 (Week 1): Patient will demonstrae timely mastication of regular textures with minimal assist cues SLP Short Term Goal 3 (Week 1): Patient will demonstrate slow, over articulated speech at the conversational level with minimal assist semantic cues to utilize compensatory strategies SLP Short Term Goal 4 (Week 1): Patient will identify effective thechiques to facilitate her speech intelligibility with moderate assist semantic cues  Skilled Therapeutic Interventions: Patient consumed trials of regular textures and thin liquids with timely mastication and intermittent cough throughout session; SLP facilitated session with supervision semantic cues to not talk with food in her mouth and utilize liquid wash to assist with removal of mild oral residue. SLP also facilitated session with supervision semantic cues to self monitor and correct speech intelligibility at the conversational level, which upon questioning is believed to be impact by patient not wearing hearing aids today. SLP educated patient on need to put on as soon as she gets up in the morning to assist with her verbal expression.   Recommend die upgrade to regular textures and thin liquids with no straws.  Daily Session Precautions/Restrictions    FIM:  Comprehension Comprehension Mode: Auditory Comprehension: 6-Follows complex conversation/direction: With extra time/assistive device Expression Expression Mode: Verbal Expression: 5-Expresses complex 90% of the time/cues < 10% of the time Social Interaction Social  Interaction: 6-Interacts appropriately with others with medication or extra time (anti-anxiety, antidepressant). Problem Solving Problem Solving: 6-Solves complex problems: With extra time Memory Memory: 6-More than reasonable amt of time FIM - Eating Eating Activity: 5: Set-up assist for open containers General    Pain Pain Assessment Pain Assessment: No/denies pain Pain Score: 0-No pain  Therapy/Group: Individual Therapy  [name], CCC-SLP [phone]  Lillyahna Hemberger 05/21/2011, 11:19 AM

## 2011-05-21 NOTE — Progress Notes (Signed)
Clinical update faxed to [facility] CM.

## 2011-05-22 DIAGNOSIS — G8110 Spastic hemiplegia affecting unspecified side: Secondary | ICD-10-CM

## 2011-05-22 DIAGNOSIS — I6330 Cerebral infarction due to thrombosis of unspecified cerebral artery: Secondary | ICD-10-CM

## 2011-05-22 DIAGNOSIS — Z5189 Encounter for other specified aftercare: Secondary | ICD-10-CM

## 2011-05-22 LAB — GLUCOSE, CAPILLARY
Glucose-Capillary: 147 mg/dL — ABNORMAL HIGH (ref 70–99)
Glucose-Capillary: 170 mg/dL — ABNORMAL HIGH (ref 70–99)

## 2011-05-22 LAB — URINE CULTURE: Colony Count: 9000

## 2011-05-22 MED ORDER — OXYBUTYNIN CHLORIDE 5 MG PO TABS
2.5000 mg | ORAL_TABLET | Freq: Two times a day (BID) | ORAL | Status: DC
  Administered 2011-05-22 – 2011-06-03 (×26): 2.5 mg via ORAL
  Administered 2011-06-04: 5 mg via ORAL
  Filled 2011-05-22 (×33): qty 0.5

## 2011-05-22 MED ORDER — METFORMIN HCL 500 MG PO TABS
500.0000 mg | ORAL_TABLET | Freq: Two times a day (BID) | ORAL | Status: DC
  Administered 2011-05-22 – 2011-06-04 (×27): 500 mg via ORAL
  Filled 2011-05-22 (×29): qty 1

## 2011-05-22 NOTE — Progress Notes (Signed)
Occupational Therapy Session Note  Patient Details  Name: Chelsey Gallagher MRN: [id number] Date of Birth: 1950-10-25  Today's Date: 05/22/2011 Time: 2725-3664 and 1330-1405 Time Calculation (min): 55 min and 35 mins  Short Term Goals: Week 1:  OT Short Term Goal 1 (Week 1): Pt will complete UB dressing with min assist OT Short Term Goal 2 (Week 1): Pt will complete LB dressing with mod assist OT Short Term Goal 3 (Week 1): Pt will complete bathing with min assist OT Short Term Goal 4 (Week 1): Pt will complete toilet transfer with min assist OT Short Term Goal 5 (Week 1): Pt will complete toileting with mod assist (2 of 3 steps)  Skilled Therapeutic Interventions/Progress Updates:    1) Pt seen for ADL retraining at sink level per pt's request secondary to having Rt AFO on and not wanting to remove it.  Educated pt on engagement of RUE in bathing and dressing tasks, encouraged pt to rest Rt arm on sink with bathing to have it involved in the task.  Focus on sit <> stand with continued mod assist to lift but increased participation due to change in w/c height.  Pt required increased assist with donning underwear and pants on RLE secondary to not wanting to remove AFO.  Issued pt elastic shoelaces to eliminate assist needed with tying shoes.  2) Engaged in NM re-ed with focus on scapular ROM, shoulder extension against gravity and with gravity eliminated, A/AROM with elbow flexion and extension.  Pt with improved scapular mobility, occasional movements with shoulder extension with gravity eliminated.  Pt reports increased movement in RUE this AM supine in bed.  Therapy Documentation Precautions:  Precautions Precautions: Fall Required Braces or Orthoses: Yes Other Brace/Splint: straps tightened to better capture calcaneus Restrictions Weight Bearing Restrictions: No Other Position/Activity Restrictions: right hemiplegia, decreased right side sensation, Dys. 3 with thin  liquids Pain: Pain Assessment Pain Assessment: No/denies pain Pain Score: 0-No pain  See FIM for current functional status  Therapy/Group: Individual Therapy  [name] 05/22/2011, 11:59 AM

## 2011-05-22 NOTE — Progress Notes (Signed)
Physical Therapy Note  Patient Details  Name: Chelsey Gallagher MRN: [id number] Date of Birth: Apr 17, 1950 Today's Date: 05/22/2011  1191-4782 (55 minutes) individual Pain: no complaint of pain Focus of treatment: wc mobility training with one arm drive wc; neuro re-ed Rt LE; sit to stand training Treatment: Wc mobility - 120 feet with one arm drive (left) intermittent min assist for steering; applied dynamic splint Rt ankle with ankle stability brace to prevent excessive ankle inversion in stance; neuro re-ed AA RT hip flexion/extension, hip abduction; sit to stand with partial squats 3 X 10 with mod assist to facilitate Rt knee extension without hip retraction; transfers stand/pivot mod assist wc>mat.   Nico Rogness,JIM 05/22/2011, 9:58 AM

## 2011-05-22 NOTE — Progress Notes (Addendum)
Physical Therapy Weekly Progress Note  Patient Details  Name: Chelsey Gallagher MRN: [id number] Date of Birth: 1950/04/24  Today's Date: 05/22/2011  Per report from PTA and chart review patient is making steady progress towards PT LTG and has met 5 of 5 short term goals and has met 1 LTG.  Patient is currently min-mod A overall for bed mobility flat bed, w/c mobility on controlled environment with one arm drive w/c, squat pivot transfers, and during static and dynamic standing balance and pre-gait training with night splint orthosis or functional electrical stimulation.  Patient has been wearing dynamic night splint on RLE to maintain ankle and foot ROM for WB during transfers and pre-gait activities.    Patient continues to demonstrate the following deficits: R hemiplegia and increased tone, impaired motor planning and control, ataxia, impulsivity, impaired standing balance and gait and therefore will continue to benefit from skilled PT intervention to enhance overall performance with balance, postural control, ability to compensate for deficits, functional use of  right upper extremity and right lower extremity and coordination.  Patient progressing toward long term goals..  Continue plan of care.  Patient's D/C date set for 06/04/11.  D/C destination and full time supervision still to be determined.  Equipment (AD, w/c and orthotic) needs to be determined as patient progresses.  PT Short Term Goals Week 1:  PT Short Term Goal 1 (Week 1): Patient will perform bed mobility with min assist demonstrating typical movement patterns. PT Short Term Goal 1 - Progress (Week 1): Met PT Short Term Goal 2 (Week 1): Patient will consistently transfer with mod assist. PT Short Term Goal 2 - Progress (Week 1): Met PT Short Term Goal 3 (Week 1): Patient will maintain dynamic sitting balance > 10 min with supervision during functional activity. PT Short Term Goal 3 - Progress (Week 1): Met PT Short Term Goal 4  (Week 1): Patient will maintain dynamic standing balance > 10 min with mod assist for dynamic pre-gait training. PT Short Term Goal 4 - Progress (Week 1): Met PT Short Term Goal 5 (Week 1): Patient will propel w/c > 100 feet with min assist in controlled environment, PT Short Term Goal 5 - Progress (Week 1): Met Week 2:  PT Short Term Goal 1 (Week 2): Patient will perform transfers w/c <> bed with consistent min A to L and R side PT Short Term Goal 2 (Week 2): Patient will perform w/c mobility in home and controlled environment with supervision. PT Short Term Goal 3 (Week 2): Patient will perform w/c <> car transfer with min A PT Short Term Goal 4 (Week 2): Patient will perform gait training with appropriate orthosis and LRAD with mod A x 50 feet.  Therapy Documentation Precautions:  Precautions Precautions: Fall Required Braces or Orthoses: Yes Other Brace/Splint: straps tightened to better capture calcaneus Restrictions Weight Bearing Restrictions: No Other Position/Activity Restrictions: right hemiplegia, decreased right side sensation, Dys. 3 with thin liquids   [name] 05/22/2011, 4:57 PM

## 2011-05-22 NOTE — Progress Notes (Signed)
Speech Language Pathology Daily Session Note & Weekly Progress Note  Patient Details  Name: Chelsey Gallagher MRN: [id number] Date of Birth: November 16, 1950  Today's Date: 05/22/2011 Time: 0800-0845 Time Calculation (min): 45 min  Short Term Goals: Week 1: SLP Short Term Goal 1 (Week 1): Patient will consume Dys. 3 thin liquid diet without observed clinical signs of aspiration with: Supervision/safety cues for use of strategies SLP Short Term Goal 1 - Progress (Week 1): Met SLP Short Term Goal 2 (Week 1): Patient will demonstrae timely mastication of regular textures with minimal assist cues SLP Short Term Goal 2 - Progress (Week 1): Met SLP Short Term Goal 3 (Week 1): Patient will demonstrate slow, over articulated speech at the conversational level with minimal assist semantic cues to utilize compensatory strategies SLP Short Term Goal 3 - Progress (Week 1): Met SLP Short Term Goal 4 (Week 1): Patient will identify effective thechiques to facilitate her speech intelligibility with moderate assist semantic cues SLP Short Term Goal 4 - Progress (Week 4): Met  Skilled Therapeutic Interventions: Session focused on trials of thin liquids via straw 4oz with no overt s/s of aspiration following consecutive sips.  SLP facilitated session with set up assist x1 to open a container and patient consumed regular textures with timely mastication and no overt s/s of aspiration through out meal.  Patient reported coughing with mixed consistency textures such as fruit cocktail and SLP educated her on use of a compensatory strategy such as a head down positioning/chin tuck to facilitate increased oral control and reduce premature spillage.  Patient verbalized understanding; Patient with modified independence with placemen of hearing aids and supervision sematic cues for overall intelligibility with cues needed not talk with food in her mouth for intelligibility at the conversational level.    Daily Session FIM:    Comprehension Comprehension Mode: Auditory Comprehension: 6-Follows complex conversation/direction: With extra time/assistive device Expression Expression Mode: Verbal Expression: 6-Expresses complex ideas: With extra time/assistive device Social Interaction Social Interaction: 6-Interacts appropriately with others with medication or extra time (anti-anxiety, antidepressant). Problem Solving Problem Solving: 6-Solves complex problems: With extra time Memory Memory: 7-Complete Independence: No helper FIM - Eating Eating Activity: 5: Set-up assist for open containers General    Pain Pain Assessment Pain Assessment: No/denies pain Pain Score: 0-No pain  Therapy/Group: Individual Therapy  Speech Language Pathology Weekly Progress Note  Patient Details  Name: Chelsey Gallagher MRN: [id number] Date of Birth: 1951/01/06  Today's Date: 05/22/2011  Short Term Goals: Week 1: SLP Short Term Goal 1 (Week 1): Patient will consume Dys. 3 thin liquid diet without observed clinical signs of aspiration with: Supervision/safety cues for use of strategies SLP Short Term Goal 1 - Progress (Week 1): Met SLP Short Term Goal 2 (Week 1): Patient will demonstrae timely mastication of regular textures with minimal assist cues SLP Short Term Goal 2 - Progress (Week 1): Met SLP Short Term Goal 3 (Week 1): Patient will demonstrate slow, over articulated speech at the conversational level with minimal assist semantic cues to utilize compensatory strategies SLP Short Term Goal 3 - Progress (Week 1): Met SLP Short Term Goal 4 (Week 1): Patient will identify effective thechiques to facilitate her speech intelligibility with moderate assist semantic cues SLP Short Term Goal 4 - Progress (Week 4): Met Week 2: SLP Short Term Goal 1 (Week 2): Patient will consume regular textures and thin liquids via straw with modified independence and no overt s/s of asipraiton  SLP Short Term Goal 2 (Week  2): Patient will  consume trials of medication with thin liquids with no overt s/s of aspiraiton and supervision semantic cues SLP Short Term Goal 3 (Week 2): Patient will utilize slow over articulated speech at the conversational level with 98% aintelligibility with modified independenceI SLP Short Term Goal 4 (Week 2): Patient will self correct speech errors as needed with modified independence during conversation  Weekly Progress Updates: Patient met 4 out of 4 short term goals this reporting period with gains in swallow function by progressing from Dys.3 textures diet and thin liquids via cup to regular textures and thin liquids via straw; as well as gains in speech intelligibility from moderate assist to supervision assist.  Patient would continue to benefit from skilled SLP services to facilitate increased self monitoring and correcting with speech intelligibility at the conversational level as well as carryover with use of compensatory strategies with consumption of mixed consistencies to reduce aspiration risk upon discharge.    SLP Frequency: 1-2 X/day, 30-60 minutes;5 out of 7 days SLP Treatment/Interventions: Cognitive remediation/compensation;Cueing hierarchy;Dysphagia/aspiration precaution training;Environmental controls;Functional tasks;Internal/external aids;Patient/family education;Therapeutic Activities;Therapeutic Exercise  Daily Session Cognition: Overall Cognitive Status: Appears within functional limits for tasks assessed Oral/Motor: Oral Motor/Sensory Function Overall Oral Motor/Sensory Function: Impaired Labial ROM: Within Functional Limits Labial Symmetry: Within Functional Limits Labial Strength: Reduced (occassional right anterior loss of bolus) Labial Sensation: Within Functional Limits Lingual ROM: Within Functional Limits Lingual Symmetry: Within Functional Limits Lingual Strength: Within Functional Limits Lingual Sensation: Within Functional Limits Facial ROM: Within Functional  Limits Facial Symmetry: Within Functional Limits Facial Strength: Within Functional Limits Facial Sensation: Reduced Velum: Impaired right Mandible: Within Functional Limits Motor Speech Overall Motor Speech: Impaired Respiration: Within functional limits Phonation: Normal Resonance: Hyponasality Articulation: Impaired (supervision to self monitor in distracting environment) Level of Impairment: Conversation Intelligibility: Intelligibility reduced (supervision ) Conversation: 75-100% accurate Motor Planning: Witnin functional limits Motor Speech Errors: Not applicable Interfering Components: Hearing loss (premorbid articulation errors /s/ and vocalic /r/) Effective Techniques: Over-articulate Comprehension: Auditory Comprehension Overall Auditory Comprehension: Appears within functional limits for tasks assessed Visual Recognition/Discrimination Discrimination: Within Function Limits Reading Comprehension Reading Status: Within funtional limits Expression: Expression Primary Mode of Expression: Verbal Verbal Expression Overall Verbal Expression: Appears within functional limits for tasks assessed Written Expression Dominant Hand: Right Written Expression: Not tested  Fae Pippin, M.A., CCC-SLP [phone]  Briselda Naval 05/22/2011, 8:47 AM

## 2011-05-22 NOTE — Progress Notes (Signed)
Per State Regulation 482.30 This chart was reviewed for medical necessity with respect to the patient's Admission/Duration of stay. Telephone update to [facility] conference report reviewed.  Pt's stay extended w/ next telephone review due after team conf. 05/28/11.   Brock Ra                 Nurse Care Manager              Next Review Date: 05/28/11

## 2011-05-22 NOTE — Progress Notes (Signed)
Patient ID: Chelsey Gallagher, female   DOB: [birth year], 61 y.o.   MRN: [id number] Reviewed FNA result with Dr. Dierdre Searles of pathology.  Cells seen are reflective of normal parotid gland tissue with suggestion of cystic component.  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  Most parotid gland masses, particularly ones that look so round on imaging, are benign.  Thus, I recommended no intervention at this time, especially in light of her recent stroke.  Follow-up with me two weeks after discharge from rehab.

## 2011-05-22 NOTE — Progress Notes (Signed)
Patient ID: Chelsey Gallagher, female   DOB: 05-06-1950, 61 y.o.   MRN: [id number]  Subjective/Complaints: Questions regarding CT chest.  This showed ground glass appearance RUL but no nodule 3 mo f/u rec. Informed pt of neg mandibular node biopsy Review of Systems  Genitourinary: Positive for urgency and frequency. Negative for dysuria.  Neurological: Positive for focal weakness.  Psychiatric/Behavioral: The patient has insomnia.   All other systems reviewed and are negative.     Objective: Vital Signs: Blood pressure 108/60, pulse 86, temperature 97.7 F (36.5 C), temperature source Oral, resp. rate 18, height 5\' 3"  (1.6 m), weight 64.7 kg (142 lb 10.2 oz), SpO2 94.00%. No results found. Results for orders placed during the hospital encounter of 05/14/11 (from the past 72 hour(s))  GLUCOSE, CAPILLARY     Status: Abnormal   Collection Time   05/19/11 11:35 AM      Component Value Range Comment   Glucose-Capillary 184 (*) 70 - 99 (mg/dL)    Comment 1 Notify RN     GLUCOSE, CAPILLARY     Status: Abnormal   Collection Time   05/19/11  4:31 PM      Component Value Range Comment   Glucose-Capillary 144 (*) 70 - 99 (mg/dL)   GLUCOSE, CAPILLARY     Status: Abnormal   Collection Time   05/19/11  9:11 PM      Component Value Range Comment   Glucose-Capillary 207 (*) 70 - 99 (mg/dL)    Comment 1 Notify RN     GLUCOSE, CAPILLARY     Status: Abnormal   Collection Time   05/20/11  7:25 AM      Component Value Range Comment   Glucose-Capillary 156 (*) 70 - 99 (mg/dL)    Comment 1 Notify RN     GLUCOSE, CAPILLARY     Status: Abnormal   Collection Time   05/20/11 11:33 AM      Component Value Range Comment   Glucose-Capillary 151 (*) 70 - 99 (mg/dL)    Comment 1 Notify RN     GLUCOSE, CAPILLARY     Status: Abnormal   Collection Time   05/20/11  5:17 PM      Component Value Range Comment   Glucose-Capillary 165 (*) 70 - 99 (mg/dL)    Comment 1 Notify RN     GLUCOSE, CAPILLARY      Status: Abnormal   Collection Time   05/20/11  9:18 PM      Component Value Range Comment   Glucose-Capillary 170 (*) 70 - 99 (mg/dL)    Comment 1 Notify RN     GLUCOSE, CAPILLARY     Status: Abnormal   Collection Time   05/21/11  7:59 AM      Component Value Range Comment   Glucose-Capillary 176 (*) 70 - 99 (mg/dL)    Comment 1 Notify RN     GLUCOSE, CAPILLARY     Status: Abnormal   Collection Time   05/21/11 12:09 PM      Component Value Range Comment   Glucose-Capillary 193 (*) 70 - 99 (mg/dL)    Comment 1 Notify RN     GLUCOSE, CAPILLARY     Status: Abnormal   Collection Time   05/21/11  3:53 PM      Component Value Range Comment   Glucose-Capillary 188 (*) 70 - 99 (mg/dL)    Comment 1 Notify RN     URINALYSIS, ROUTINE W REFLEX MICROSCOPIC  Status: Abnormal   Collection Time   05/21/11  5:54 PM      Component Value Range Comment   Color, Urine YELLOW  YELLOW     APPearance CLEAR  CLEAR     Specific Gravity, Urine 1.020  1.005 - 1.030     pH 5.5  5.0 - 8.0     Glucose, UA 250 (*) NEGATIVE (mg/dL)    Hgb urine dipstick NEGATIVE  NEGATIVE     Bilirubin Urine NEGATIVE  NEGATIVE     Ketones, ur NEGATIVE  NEGATIVE (mg/dL)    Protein, ur NEGATIVE  NEGATIVE (mg/dL)    Urobilinogen, UA 1.0  0.0 - 1.0 (mg/dL)    Nitrite NEGATIVE  NEGATIVE     Leukocytes, UA NEGATIVE  NEGATIVE  MICROSCOPIC NOT DONE ON URINES WITH NEGATIVE PROTEIN, BLOOD, LEUKOCYTES, NITRITE, OR GLUCOSE <1000 mg/dL.  GLUCOSE, CAPILLARY     Status: Abnormal   Collection Time   05/21/11  9:42 PM      Component Value Range Comment   Glucose-Capillary 138 (*) 70 - 99 (mg/dL)    Comment 1 Notify RN      hysical Exam  Vitals reviewed.  Constitutional: She is oriented to person, place, and time. She appears well-developed.  HENT:  Head: Normocephalic. PERRL, EOMI  Neck: Normal range of motion. Neck supple. No thyromegaly present.  Cardiovascular: Normal rate and regular rhythm.  Pulmonary/Chest: Effort normal and  breath sounds normal. She has no wheezes.  Abdominal: Soft. She exhibits no distension. There is no tenderness.  Musculoskeletal: She exhibits no edema.  Neurological: She is alert and oriented to person, place, and time.  She is somewhat hard of hearing (Right more affected than left). She wears hearing aids on either ear. Shows good awareness of deficits. Follow three-step commands.Severely Dysarthric speech. Gaze intact. Poor visual acuity overall. Right facial weakness and lower facial sensory loss with tongue deviation. A few beats of nystagmus with superior gaze and left gaze. RUE 0'/5. RLE 2+ prox to 0/trace distally with ADF and 1+/5 APF. Left side motor and sensory nromal. Sensation 1/2 on right (feels pins and needles). She can sense pain however.  Skin: Skin is warm and dry.  Psychiatric: She has a normal mood and affect.  Mild anxiety  Assessment/Plan: 1. 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 Physiatrist is providing close team supervision and 24 hour management of active medical problems listed below. Physiatrist and rehab team continue to assess barriers to discharge/monitor patient progress toward functional and medical goals. FIM: FIM - Bathing Bathing Steps Patient Completed: Chest;Right Arm;Abdomen;Front perineal area;Buttocks;Right upper leg;Left upper leg;Right lower leg (including foot);Left lower leg (including foot) Bathing: 4: Min-Patient completes 8-9 58f 10 parts or 75+ percent  FIM - Upper Body Dressing/Undressing Upper body dressing/undressing steps patient completed: Thread/unthread right sleeve of pullover shirt/dresss;Thread/unthread left sleeve of pullover shirt/dress;Put head through opening of pull over shirt/dress;Pull shirt over trunk Upper body dressing/undressing: 5: Supervision: Safety issues/verbal cues FIM - Lower Body  Dressing/Undressing Lower body dressing/undressing steps patient completed: Thread/unthread right underwear leg;Thread/unthread left underwear leg;Thread/unthread right pants leg;Thread/unthread left pants leg;Don/Doff right sock;Don/Doff left sock Lower body dressing/undressing: 4: Min-Patient completed 75 plus % of tasks  FIM - Toileting Toileting steps completed by patient: Performs perineal hygiene Toileting Assistive Devices: Grab bar or rail for support Toileting: 2: Max-Patient completed 1 of 3 steps  FIM - Toilet  Transfers Museum/gallery curator Devices: Therapist, music Transfers: 3-From toilet/BSC: Mod A (lift or lower assist)  FIM - Banker Devices: Arm rests Bed/Chair Transfer: 4: Supine > Sit: Min A (steadying Pt. > 75%/lift 1 leg);3: Bed > Chair or W/C: Mod A (lift or lower assist)  FIM - Locomotion: Wheelchair Distance: 150 Locomotion: Wheelchair: 2: Travels 50 - 149 ft with minimal assistance (Pt.>75%) (in 1 arm drive w/c) FIM - Locomotion: Ambulation Locomotion: Ambulation Assistive Devices: Fara Boros Ambulation/Gait Assistance: 1: +2 Total assist Locomotion: Ambulation: 1: Two helpers  Comprehension Comprehension Mode: Auditory Comprehension: 6-Follows complex conversation/direction: With extra time/assistive device  Expression Expression Mode: Verbal Expression: 5-Expresses basic 90% of the time/requires cueing < 10% of the time.  Social Interaction Social Interaction: 6-Interacts appropriately with others with medication or extra time (anti-anxiety, antidepressant).  Problem Solving Problem Solving: 6-Solves complex problems: With extra time  Memory Memory: 6-More than reasonable amt of time   Medical Problem List and Plan:  1. 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 DVT Prophylaxis/Anticoagulation: Subcutaneous lovenox. Monitor platelet counts and any signs of bleeding  3. Left neck  mandible mass.  4. 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 Plan outpatient followup for revascularization with intervention radiology  5. Diabetes mellitus. Hemoglobin A1c of 10.2. Lantus insulin 15 units at bedtime. Check blood sugars a.c. and at bedtime. Patient on glucophage 1000 mg twice daily prior to admission resume 6. Hypertension. Lisinopril 40 mg daily. Monitor with increased activity  7. Tobacco abuse. Nicoderm patch.  8. Restless leg syndrome.D/C Requip  pt states she used neurontin at home worse on R due to spasticity.  Start klonopin should help for sleep as well increase dose 9. Depression.celexa. Provide emotional support and positive reinforcement  10. Hyperlipidemia. Lipitor 11.  Urinary freq  UMN bladder trial ditropan P   LOS (Days) 8 A FACE TO FACE EVALUATION WAS PERFORMED  Zubair Lofton E 05/22/2011, 7:48 AM

## 2011-05-23 LAB — GLUCOSE, CAPILLARY
Glucose-Capillary: 241 mg/dL — ABNORMAL HIGH (ref 70–99)
Glucose-Capillary: 173 mg/dL — ABNORMAL HIGH (ref 70–99)

## 2011-05-23 NOTE — Progress Notes (Signed)
Occupational Therapy Weekly Progress Note  Patient Details  Name: Chelsey Gallagher MRN: [id number] Date of Birth: [birth year]  Today's Date: 05/23/2011 Time: 4696-2952 Time Calculation (min): 30 min  Patient has met 3 of 5 short term goals.  Pt continues to require mod assist with toilet transfer secondary to RLE weakness and tendency for Rt knee to buckle and turn in with transfers requiring tactile cues and stability as well as mod assist with sit to stand prior to stand pivot transfer.  At this time pt is unable to maintain standing balance without increased assist to manage clothing and hygiene with toileting, tends to remain at a max assist at this time with toileting.  Pt has shown improvement in sit to stand levels and decreased assist for transfers and is very motivated to progress and be as independent as possible.  Pt showing slight movement in RUE mostly at scapular and shoulder range.    Patient continues to demonstrate the following deficits: decreased transfers, dominant RUE weakness, decreased standing balance and therefore will continue to benefit from skilled OT intervention to enhance overall performance with BADL, iADL and Reduce care partner burden.  Patient progressing toward long term goals..  Continue plan of care.  OT Short Term Goals Week 1:  OT Short Term Goal 1 (Week 1): Pt will complete UB dressing with min assist OT Short Term Goal 1 - Progress (Week 1): Met OT Short Term Goal 2 (Week 1): Pt will complete LB dressing with mod assist OT Short Term Goal 2 - Progress (Week 1): Met OT Short Term Goal 3 (Week 1): Pt will complete bathing with min assist OT Short Term Goal 3 - Progress (Week 1): Met OT Short Term Goal 4 (Week 1): Pt will complete toilet transfer with min assist OT Short Term Goal 4 - Progress (Week 1): Progressing toward goal OT Short Term Goal 5 (Week 1): Pt will complete toileting with mod assist (2 of 3 steps) OT Short Term Goal 5 - Progress (Week  1): Progressing toward goal Week 2:  OT Short Term Goal 1 (Week 2): Pt will complete toilet transfer with min assist OT Short Term Goal 2 (Week 2): Pt will complete toileting with mod assist (2 of 3 steps) OT Short Term Goal 3 (Week 2): Pt will complete LB dressing with min assist OT Short Term Goal 4 (Week 2): Pt will complete grooming with min assist in standing at sink OT Short Term Goal 5 (Week 2): Pt will complete tub/shower transfer with min assist  Skilled Therapeutic Interventions/Progress Updates:    Pt seen for NM re-ed with focus on RUE scapular ROM and towel glides with internal/external rotation, horizontal abduction and adduction, and shoulder flexion/extension and sit to stand with support at Rt knee to decrease internal rotation and buckling of knee.  Pt with increased questions about progress towards goals and prognosis.    Therapy Documentation Precautions:  Precautions Precautions: Fall Required Braces or Orthoses: Yes Other Brace/Splint: for gait: air cast R ankle Restrictions Weight Bearing Restrictions: No Other Position/Activity Restrictions: right hemiplegia, decreased right side sensation, Dys. 3 with thin liquids General:   Vital Signs: Therapy Vitals Temp: 97.3 F (36.3 C) Temp src: Oral Pulse Rate: 79  Resp: 18  BP: 132/60 mmHg Patient Position, if appropriate: Sitting Oxygen Therapy SpO2: 96 % Pain: Pain Assessment Pain Assessment: No/denies pain Pain Score: 0-No pain ADL: ADL Grooming: Minimal assistance Where Assessed-Grooming: Sitting at sink Upper Body Bathing: Minimal assistance Where Assessed-Upper Body  Bathing: Shower Lower Body Bathing: Minimal assistance Where Assessed-Lower Body Bathing: Shower Upper Body Dressing: Minimal assistance Where Assessed-Upper Body Dressing: Sitting at sink Lower Body Dressing: Moderate assistance Where Assessed-Lower Body Dressing: Sitting at sink;Standing at sink Toileting: Maximal assistance Where  Assessed-Toileting: Teacher, adult education: Moderate assistance Toilet Transfer Method: Stand pivot Toilet Transfer Equipment: Engineer, technical sales: Moderate assistance (simulated) Tub/Shower Transfer Method: Stand pivot Tub/Shower Equipment: Insurance underwriter: Moderate assistance Film/video editor Method: Warden/ranger: Shower seat with back  See FIM for current functional status  Therapy/Group: Individual Therapy  [name] 05/23/2011, 4:12 PM

## 2011-05-23 NOTE — Progress Notes (Signed)
Occupational Therapy Session Note  Patient Details  Name: Chelsey Gallagher MRN: [id number] Date of Birth: February 09, 1951  Today's Date: 05/23/2011 Time: 1030-1130 Time Calculation (min): 60 min  Short Term Goals: Week 1:  OT Short Term Goal 1 (Week 1): Pt will complete UB dressing with min assist OT Short Term Goal 2 (Week 1): Pt will complete LB dressing with mod assist OT Short Term Goal 3 (Week 1): Pt will complete bathing with min assist OT Short Term Goal 4 (Week 1): Pt will complete toilet transfer with min assist OT Short Term Goal 5 (Week 1): Pt will complete toileting with mod assist (2 of 3 steps)  Skilled Therapeutic Interventions/Progress Updates:    1:1 neuro-muscular reeducation: focusing on right scapular/UE normal patterns of movement, flexion/ extension of shoulder and elbow with slight resistance, trace supination/pronation and grasp, weight bearing through right UE for functional reach with left UE, transfers, push and pull with normal patterns of movement, sit to stand, standing balance at midline  Therapy Documentation Precautions:  Precautions Precautions: Fall Required Braces or Orthoses: Yes Other Brace/Splint: for gait: air cast R ankle Restrictions Weight Bearing Restrictions: No Other Position/Activity Restrictions: right hemiplegia, decreased right side sensation, Dys. 3 with thin liquids Pain: Pain Assessment Pain Assessment: No/denies pain Pain Score: 0-No pain  See FIM for current functional status  Therapy/Group: Individual Therapy  Melonie Florida 05/23/2011, 3:02 PM

## 2011-05-23 NOTE — Progress Notes (Signed)
Social Work Patient ID: Chelsey Gallagher, female   DOB: 1950/10/11, 62 y.o.   MRN: [id number]  Had addressed safety and abuse on assessment , but somehow not pulled into assessment. Had added this to assessment, so therefore now complete.  Pt reports good day today.

## 2011-05-23 NOTE — Progress Notes (Signed)
Social Work Patient ID: Chelsey Gallagher, female   DOB: September 24, 1950, 61 y.o.   MRN: [id number]  Met with pt to check to see how the switch out of the wheelchairs was working.  She reports: " It is so much better I can do more now and it is not so hard."  Very pleased with the higher wheelchair.  Ex-husband in to observe in  therapies and willing to assist with insulin at home.  Continues to be motivated in therapies and progress.

## 2011-05-23 NOTE — Progress Notes (Addendum)
Physical Therapy Session Note  Patient Details  Name: Chelsey Gallagher MRN: [id number] Date of Birth: [birth year]  Today's Date: 05/23/2011 Time: 0930-1030, 1410-1450 Time Calculation (min): 60 min and 40 min  Short Term Goals:  Week 2:  PT Short Term Goal 1 (Week 2): Patient will perform transfers w/c <> bed with consistent min A to L and R side PT Short Term Goal 2 (Week 2): Patient will perform w/c mobility in home and controlled environment with supervision. PT Short Term Goal 3 (Week 2): Patient will perform w/c <> car transfer with min A PT Short Term Goal 4 (Week 2): Patient will perform gait training with appropriate orthosis and LRAD with mod A x 50 feet.  Skilled Therapeutic Interventions/Progress Updates: focus of treatment: R LE stretching of hamstrings and heel cord before donning air cast and shoe; therapeutic exercise performed with LE to increase strength for functional mobility, w/c mobility, neuromuscular re-education RLE via manual and verbal cues, forced use; transfer and gait training. 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     Standing from w/c to pull up pants, with mod A for balance, focusing on RLE hip and knee stability.   W/c mobility x 157' in 1 arm drive w/c using LUE, with min A rarely for steering after review of technique.  W/C > simulated car transfer to typical sedan ht seat, stand/step with mod A overall.  R femur tends to adduct and IR in standing, due to long-standing hip weakness after THR approx. 5 years ago.  Gait training x 50' with Carley Hammed walker, R ankle air cast and ACE for ankle DF;  +2 A for steering, focusing on R hip and knee stability during stance phase, = step lengths, upright trunk and forward gaze.  In sitting, neuromuscular re-education of trunk with lateral wt shifting to increase head righting with trunk shortening/lengthening, with occasional min A for LOB R.    PM- consult with [name] of [place]  Seating with Illene Bolus, PT, for specialty w/c.  Pt has shown good potential for mod I household locomotion, using a 1-arm drive w/c, using her LUE.  Due to pt's short stature, and short leg lengths, she requires significantly more assistance to stand up from a super hemi ht w/c, so a regular ht, 1- arm drive wheelchair will decrease caregiver burden and improve pt's independence. PT and licensed ATP decided upon a: 62' x16" L arm drive w/c with hard back for postural support, with padded L 1/2 armrest,  brake extensions, Neoprene strap for RLE positioning on legrest due to pt's hypertonus and weak R hip. Lorin Picket will pursue a loaner w/c for pt to use before d/c.       Therapy Documentation Precautions:  Precautions Precautions: Fall Required Braces or Orthoses: Yes Other Brace/Splint: for gait: air cast R ankle Restrictions Weight Bearing Restrictions: No Other Position/Activity Restrictions: right hemiplegia, decreased right side sensation, Dys. 3 with thin liquids   Pain: Pain Assessment Pain Assessment: No/denies pain Pain Score: 0-No pain AM and PM    Locomotion : Ambulation Ambulation/Gait Assistance: 1: +2 Total assist (R ankle air cast, and Ace wrap for ankle DF)       Exercises: in sitting, passive and active stretching RLE hams and heel cord; joint mobilizations to facilitate R ankle DF;  R quad sets x 5 seconds to facilitate stretching       See FIM for current functional status  Therapy/Group:  Individual Therapy  Sanaai Doane 05/23/2011, 12:30 PM

## 2011-05-23 NOTE — Progress Notes (Signed)
Physical Therapy Session Note  Patient Details  Name: Chelsey Gallagher MRN: [id number] Date of Birth: 09-Mar-1950  Today's Date: 05/23/2011 Time: 1410-1450 Time Calculation (min): 40 min  Short Term Goals: Week 2:  PT Short Term Goal 1 (Week 2): Patient will perform transfers w/c <> bed with consistent min A to L and R side PT Short Term Goal 2 (Week 2): Patient will perform w/c mobility in home and controlled environment with supervision. PT Short Term Goal 3 (Week 2): Patient will perform w/c <> car transfer with min A PT Short Term Goal 4 (Week 2): Patient will perform gait training with appropriate orthosis and LRAD with mod A x 50 feet.  Skilled Therapeutic Interventions/Progress Updates:     Therapy Documentation Precautions:  Precautions Precautions: Fall Required Braces or Orthoses: Yes Other Brace/Splint: for gait: air cast R ankle Restrictions Weight Bearing Restrictions: No Other Position/Activity Restrictions: right hemiplegia, decreased right side sensation, Dys. 3 with thin liquids   Other Treatments:  Custom w/c evaluation completed this afternoon. Recommend left one-arm drive at standard height with rigid back to improve and facilitate postural alignment, extended brakes, padded right 1/2 lap tray, foot plate strap to facilitate improved alignment of right ankle and foot, and pressure relieving cushion to maximize functional independence in home and community environments.  See FIM for current functional status  Therapy/Group: Individual Therapy  Romeo Rabon 05/23/2011, 3:31 PM

## 2011-05-23 NOTE — Progress Notes (Signed)
 Patient ID: Chelsey Gallagher, female   DOB: August 31, 1950, 61 y.o.   MRN: [id number]  Subjective/Complaints: Tired ,dry mouth but bladder less overactive. Review of Systems  Genitourinary: Positive for urgency and frequency. Negative for dysuria.  Neurological: Positive for focal weakness.  Psychiatric/Behavioral: The patient has insomnia.   All other systems reviewed and are negative.     Objective: Vital Signs: Blood pressure 121/80, pulse 73, temperature 97.9 F (36.6 C), temperature source Oral, resp. rate 20, height 5\' 3"  (1.6 m), weight 64.7 kg (142 lb 10.2 oz), SpO2 99.00%. No results found. Results for orders placed during the hospital encounter of 05/14/11 (from the past 72 hour(s))  GLUCOSE, CAPILLARY     Status: Abnormal   Collection Time   05/20/11 11:33 AM      Component Value Range Comment   Glucose-Capillary 151 (*) 70 - 99 (mg/dL)    Comment 1 Notify RN     GLUCOSE, CAPILLARY     Status: Abnormal   Collection Time   05/20/11  5:17 PM      Component Value Range Comment   Glucose-Capillary 165 (*) 70 - 99 (mg/dL)    Comment 1 Notify RN     GLUCOSE, CAPILLARY     Status: Abnormal   Collection Time   05/20/11  9:18 PM      Component Value Range Comment   Glucose-Capillary 170 (*) 70 - 99 (mg/dL)    Comment 1 Notify RN     GLUCOSE, CAPILLARY     Status: Abnormal   Collection Time   05/21/11  7:59 AM      Component Value Range Comment   Glucose-Capillary 176 (*) 70 - 99 (mg/dL)    Comment 1 Notify RN     GLUCOSE, CAPILLARY     Status: Abnormal   Collection Time   05/21/11 12:09 PM      Component Value Range Comment   Glucose-Capillary 193 (*) 70 - 99 (mg/dL)    Comment 1 Notify RN     GLUCOSE, CAPILLARY     Status: Abnormal   Collection Time   05/21/11  3:53 PM      Component Value Range Comment   Glucose-Capillary 188 (*) 70 - 99 (mg/dL)    Comment 1 Notify RN     URINALYSIS, ROUTINE W REFLEX MICROSCOPIC     Status: Abnormal   Collection Time   05/21/11  5:54  PM      Component Value Range Comment   Color, Urine YELLOW  YELLOW     APPearance CLEAR  CLEAR     Specific Gravity, Urine 1.020  1.005 - 1.030     pH 5.5  5.0 - 8.0     Glucose, UA 250 (*) NEGATIVE (mg/dL)    Hgb urine dipstick NEGATIVE  NEGATIVE     Bilirubin Urine NEGATIVE  NEGATIVE     Ketones, ur NEGATIVE  NEGATIVE (mg/dL)    Protein, ur NEGATIVE  NEGATIVE (mg/dL)    Urobilinogen, UA 1.0  0.0 - 1.0 (mg/dL)    Nitrite NEGATIVE  NEGATIVE     Leukocytes, UA NEGATIVE  NEGATIVE  MICROSCOPIC NOT DONE ON URINES WITH NEGATIVE PROTEIN, BLOOD, LEUKOCYTES, NITRITE, OR GLUCOSE <1000 mg/dL.  URINE CULTURE     Status: Normal   Collection Time   05/21/11  5:54 PM      Component Value Range Comment   Specimen Description URINE, RANDOM      Special Requests NONE  Culture  Setup Time [id number]      Colony Count 9,000 COLONIES/ML      Culture INSIGNIFICANT GROWTH      Report Status 05/22/2011 FINAL     GLUCOSE, CAPILLARY     Status: Abnormal   Collection Time   05/21/11  9:42 PM      Component Value Range Comment   Glucose-Capillary 138 (*) 70 - 99 (mg/dL)    Comment 1 Notify RN     GLUCOSE, CAPILLARY     Status: Abnormal   Collection Time   05/22/11  7:23 AM      Component Value Range Comment   Glucose-Capillary 147 (*) 70 - 99 (mg/dL)    Comment 1 Notify RN     GLUCOSE, CAPILLARY     Status: Abnormal   Collection Time   05/22/11 11:39 AM      Component Value Range Comment   Glucose-Capillary 170 (*) 70 - 99 (mg/dL)    Comment 1 Notify RN     GLUCOSE, CAPILLARY     Status: Abnormal   Collection Time   05/22/11  4:26 PM      Component Value Range Comment   Glucose-Capillary 220 (*) 70 - 99 (mg/dL)    Comment 1 Notify RN     GLUCOSE, CAPILLARY     Status: Abnormal   Collection Time   05/22/11  9:40 PM      Component Value Range Comment   Glucose-Capillary 197 (*) 70 - 99 (mg/dL)    Comment 1 Notify RN     GLUCOSE, CAPILLARY     Status: Abnormal   Collection Time   05/23/11   7:15 AM      Component Value Range Comment   Glucose-Capillary 191 (*) 70 - 99 (mg/dL)    Comment 1 Notify RN      hysical Exam  Vitals reviewed.  Constitutional: She is oriented to person, place, and time. She appears well-developed.  HENT:  Head: Normocephalic. PERRL, EOMI  Neck: Normal range of motion. Neck supple. No thyromegaly present.  Cardiovascular: Normal rate and regular rhythm.  Pulmonary/Chest: Effort normal and breath sounds normal. She has no wheezes.  Abdominal: Soft. She exhibits no distension. There is no tenderness.  Musculoskeletal: She exhibits no edema.  Neurological: She is alert and oriented to person, place, and time.  She is somewhat hard of hearing (Right more affected than left). She wears hearing aids on either ear. Shows good awareness of deficits. Follow three-step commands.Severely Dysarthric speech. Gaze intact. Poor visual acuity overall. Right facial weakness and lower facial sensory loss with tongue deviation. A few beats of nystagmus with superior gaze and left gaze. RUE 0'/5. RLE 2+ prox to 0/trace distally with ADF and 1+/5 APF. Left side motor and sensory nromal. Sensation 1/2 on right (feels pins and needles). She can sense pain however.  Skin: Skin is warm and dry.  Psychiatric: She has a normal mood and affect.  Mild anxiety  Assessment/Plan: 1. 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 Physiatrist is providing close team supervision and 24 hour management of active medical problems listed below. Physiatrist and rehab team continue to assess barriers to discharge/monitor patient progress toward functional and medical goals. FIM: FIM - Bathing Bathing Steps Patient Completed: Chest;Right Arm;Abdomen;Front perineal area;Buttocks;Left upper leg;Left lower leg (including foot) Bathing: 3: Mod-Patient completes 5-7 21f 10  parts or 50-74%  FIM - Upper Body Dressing/Undressing Upper body dressing/undressing steps patient completed: Thread/unthread right sleeve of pullover shirt/dresss;Thread/unthread left sleeve of pullover shirt/dress;Put head through opening of pull over shirt/dress;Pull shirt over trunk Upper body dressing/undressing: 5: Supervision: Safety issues/verbal cues FIM - Lower Body Dressing/Undressing Lower body dressing/undressing steps patient completed: Thread/unthread left underwear leg;Pull underwear up/down;Thread/unthread left pants leg;Pull pants up/down;Don/Doff left shoe;Don/Doff left sock Lower body dressing/undressing: 4: Min-Patient completed 75 plus % of tasks  FIM - Toileting Toileting steps completed by patient: Adjust clothing prior to toileting Toileting Assistive Devices: Grab bar or rail for support Toileting: 2: Max-Patient completed 1 of 3 steps  FIM - Diplomatic Services operational officer Devices: Therapist, music Transfers: 3-From toilet/BSC: Mod A (lift or lower assist)  FIM - Banker Devices: Arm rests Bed/Chair Transfer: 4: Supine > Sit: Min A (steadying Pt. > 75%/lift 1 leg);3: Bed > Chair or W/C: Mod A (lift or lower assist)  FIM - Locomotion: Wheelchair Distance: 150 Locomotion: Wheelchair: 2: Travels 50 - 149 ft with minimal assistance (Pt.>75%) (in 1 arm drive w/c) FIM - Locomotion: Ambulation Locomotion: Ambulation Assistive Devices: Fara Boros Ambulation/Gait Assistance: 1: +2 Total assist Locomotion: Ambulation: 1: Two helpers  Comprehension Comprehension Mode: Auditory Comprehension: 6-Follows complex conversation/direction: With extra time/assistive device  Expression Expression Mode: Verbal Expression: 6-Expresses complex ideas: With extra time/assistive device  Social Interaction Social Interaction: 6-Interacts appropriately with others with medication or extra time (anti-anxiety,  antidepressant).  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 DVT Prophylaxis/Anticoagulation: Subcutaneous lovenox. Monitor platelet counts and any signs of bleeding  3. Left neck mandible mass.  4. 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 Plan outpatient followup for revascularization with intervention radiology  5. Diabetes mellitus. Hemoglobin A1c of 10.2. Lantus insulin 15 units at bedtime. Check blood sugars a.c. and at bedtime. Patient on glucophage 1000 mg twice daily prior to admission resume 6. Hypertension. Lisinopril 40 mg daily. Monitor with increased activity  7. Tobacco abuse. Nicoderm patch.  8. Restless leg syndrome.D/C Requip  pt states she used neurontin at home worse on R due to spasticity.  Start klonopin should help for sleep as well increase dose 9. Depression.celexa. Provide emotional support and positive reinforcement  10. Hyperlipidemia. Lipitor 11.  Urinary freq  UMN bladder trial ditropan P   LOS (Days) 9 A FACE TO FACE EVALUATION WAS PERFORMED  , E 05/23/2011, 8:07 AM

## 2011-05-23 NOTE — Progress Notes (Signed)
Per State Regulation 482.30 This chart was reviewed for medical necessity with respect to the patient's Admission/Duration of stay. Pt continues to participate in therapies with steady progress. Urinary incontinence improving with ditropan. Tolerating regular diet. Started on metformin. Working on DM education.  Meryl Dare                 Nurse Care Manager            Next Review Date: 05/26/11

## 2011-05-23 NOTE — Progress Notes (Signed)
Speech Language Pathology Daily Session Note  Patient Details  Name: Chelsey Gallagher MRN: [id number] Date of Birth: 06/05/1950  Today's Date: 05/23/2011 Time: 8469-6295 Time Calculation (min): 40 min  Short Term Goals: Week 2: SLP Short Term Goal 1 (Week 2): Patient will consume regular textures and thin liquids via straw with modified independence and no overt s/s of asipraiton  SLP Short Term Goal 2 (Week 2): Patient will consume trials of medication with thin liquids with no overt s/s of aspiraiton and supervision semantic cues SLP Short Term Goal 3 (Week 2): Patient will utilize slow over articulated speech at the conversational level with 98% aintelligibility with modified independenceI SLP Short Term Goal 4 (Week 2): Patient will self correct speech errors as needed with modified independence during conversation  Skilled Therapeutic Interventions: Patient asleep but easily aroused with moderate cues for speech intelligibility due to dry mouth and lethargy suspect to be due to medication administration for pain, administered early this morning.  Patient required increased wait time to consume regular textures and thin liquids with no overt s/s of aspiration.  Patient consumed mixed consistency (cereal) with minimal cough x1 and supervision semantic cues to take small bites and not talk with food in her mouth and self monitor right anterior loss of bolus.  RN administered medication whole with liquids per SLP request and patient exhibited difficulty transiting the pill as well as multiple sips and swallows with report of it feeling stuck in her throat; SLP facilitated session with supervision semantic cues to follow with spoon of puree and minimal assist semantic cues to perform hard effortful swallows which eventually cleared.  As a result it is recommended that patient continue to take medication whole in puree at this time.    Daily Session Precautions/Restrictions  Restrictions Weight  Bearing Restrictions: No FIM:  Comprehension Comprehension Mode: Auditory Comprehension: 6-Follows complex conversation/direction: With extra time/assistive device Expression Expression Mode: Verbal Expression: 5-Set-up assist with assistive device Social Interaction Social Interaction: 6-Interacts appropriately with others with medication or extra time (anti-anxiety, antidepressant). Problem Solving Problem Solving: 6-Solves complex problems: With extra time Memory Memory: 6-More than reasonable amt of time FIM - Eating Eating Activity: 6: More than reasonable amount of time General    Pain Pain Assessment Pain Assessment: No/denies pain Pain Score: 0-No pain  Therapy/Group: Individual Therapy  [name], CCC-SLP [phone]  Aleenah Homen 05/23/2011, 11:05 AM

## 2011-05-24 LAB — GLUCOSE, CAPILLARY: Glucose-Capillary: 176 mg/dL — ABNORMAL HIGH (ref 70–99)

## 2011-05-24 MED ORDER — NYSTATIN 500000 UNITS PO TABS
500000.0000 [IU] | ORAL_TABLET | Freq: Three times a day (TID) | ORAL | Status: AC
  Administered 2011-05-24 – 2011-05-28 (×14): 500000 [IU] via ORAL
  Filled 2011-05-24 (×17): qty 1

## 2011-05-24 NOTE — Progress Notes (Signed)
Patient ID: Chelsey Gallagher, female   DOB: September 07, 1950, 61 y.o.   MRN: [id number] Patient ID: Chelsey Gallagher, female   DOB: [birth year], 61 y.o.   MRN: [id number]  Subjective/Complaints: Tired ,dry mouth but bladder less overactive. C/o ST Review of Systems  Genitourinary: Positive for urgency and frequency. Negative for dysuria.  Neurological: Positive for focal weakness.  Psychiatric/Behavioral: The patient has insomnia.   All other systems reviewed and are negative.     Objective: Vital Signs: Blood pressure 120/77, pulse 91, temperature 98.4 F (36.9 C), temperature source Oral, resp. rate 20, height 5\' 3"  (1.6 m), weight 142 lb 10.2 oz (64.7 kg), SpO2 93.00%. No results found. Results for orders placed during the hospital encounter of 05/14/11 (from the past 72 hour(s))  GLUCOSE, CAPILLARY     Status: Abnormal   Collection Time   05/21/11 12:09 PM      Component Value Range Comment   Glucose-Capillary 193 (*) 70 - 99 (mg/dL)    Comment 1 Notify RN     GLUCOSE, CAPILLARY     Status: Abnormal   Collection Time   05/21/11  3:53 PM      Component Value Range Comment   Glucose-Capillary 188 (*) 70 - 99 (mg/dL)    Comment 1 Notify RN     URINALYSIS, ROUTINE W REFLEX MICROSCOPIC     Status: Abnormal   Collection Time   05/21/11  5:54 PM      Component Value Range Comment   Color, Urine YELLOW  YELLOW     APPearance CLEAR  CLEAR     Specific Gravity, Urine 1.020  1.005 - 1.030     pH 5.5  5.0 - 8.0     Glucose, UA 250 (*) NEGATIVE (mg/dL)    Hgb urine dipstick NEGATIVE  NEGATIVE     Bilirubin Urine NEGATIVE  NEGATIVE     Ketones, ur NEGATIVE  NEGATIVE (mg/dL)    Protein, ur NEGATIVE  NEGATIVE (mg/dL)    Urobilinogen, UA 1.0  0.0 - 1.0 (mg/dL)    Nitrite NEGATIVE  NEGATIVE     Leukocytes, UA NEGATIVE  NEGATIVE  MICROSCOPIC NOT DONE ON URINES WITH NEGATIVE PROTEIN, BLOOD, LEUKOCYTES, NITRITE, OR GLUCOSE <1000 mg/dL.  URINE CULTURE     Status: Normal   Collection Time   05/21/11  5:54 PM      Component Value Range Comment   Specimen Description URINE, RANDOM      Special Requests NONE      Culture  Setup Time [id number]      Colony Count 9,000 COLONIES/ML      Culture INSIGNIFICANT GROWTH      Report Status 05/22/2011 FINAL     GLUCOSE, CAPILLARY     Status: Abnormal   Collection Time   05/21/11  9:42 PM      Component Value Range Comment   Glucose-Capillary 138 (*) 70 - 99 (mg/dL)    Comment 1 Notify RN     GLUCOSE, CAPILLARY     Status: Abnormal   Collection Time   05/22/11  7:23 AM      Component Value Range Comment   Glucose-Capillary 147 (*) 70 - 99 (mg/dL)    Comment 1 Notify RN     GLUCOSE, CAPILLARY     Status: Abnormal   Collection Time   05/22/11 11:39 AM      Component Value Range Comment   Glucose-Capillary 170 (*) 70 - 99 (mg/dL)    Comment 1 Notify  RN     GLUCOSE, CAPILLARY     Status: Abnormal   Collection Time   05/22/11  4:26 PM      Component Value Range Comment   Glucose-Capillary 220 (*) 70 - 99 (mg/dL)    Comment 1 Notify RN     GLUCOSE, CAPILLARY     Status: Abnormal   Collection Time   05/22/11  9:40 PM      Component Value Range Comment   Glucose-Capillary 197 (*) 70 - 99 (mg/dL)    Comment 1 Notify RN     GLUCOSE, CAPILLARY     Status: Abnormal   Collection Time   05/23/11  7:15 AM      Component Value Range Comment   Glucose-Capillary 191 (*) 70 - 99 (mg/dL)    Comment 1 Notify RN     GLUCOSE, CAPILLARY     Status: Abnormal   Collection Time   05/23/11 12:22 PM      Component Value Range Comment   Glucose-Capillary 241 (*) 70 - 99 (mg/dL)    Comment 1 Notify RN     GLUCOSE, CAPILLARY     Status: Abnormal   Collection Time   05/23/11  9:20 PM      Component Value Range Comment   Glucose-Capillary 173 (*) 70 - 99 (mg/dL)    Comment 1 Notify RN     GLUCOSE, CAPILLARY     Status: Abnormal   Collection Time   05/24/11  7:47 AM      Component Value Range Comment   Glucose-Capillary 176 (*) 70 - 99 (mg/dL)     Comment 1 Notify RN      hysical Exam  Vitals reviewed.  Constitutional: She is oriented to person, place, and time. She appears well-developed.  HENT: mild thrush Head: Normocephalic. PERRL, EOMI  Neck: Normal range of motion. Neck supple. No thyromegaly present.  Cardiovascular: Normal rate and regular rhythm.  Pulmonary/Chest: Effort normal and breath sounds normal. She has no wheezes.  Abdominal: Soft. She exhibits no distension. There is no tenderness.  Musculoskeletal: She exhibits no edema.  Neurological: She is alert and oriented to person, place, and time.  She is somewhat hard of hearing (Right more affected than left). She wears hearing aids on either ear. Shows good awareness of deficits. Follow three-step commands.Severely Dysarthric speech. Gaze intact. Poor visual acuity overall. Right facial weakness and lower facial sensory loss with tongue deviation. A few beats of nystagmus with superior gaze and left gaze. RUE 0'/5. RLE 2+ prox to 0/trace distally with ADF and 1+/5 APF. Left side motor and sensory nromal. Sensation 1/2 on right (feels pins and needles). She can sense pain however.  Skin: Skin is warm and dry.  Psychiatric: She has a normal mood and affect.  Mild anxiety  Assessment/Plan: 1. 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 Physiatrist is providing close team supervision and 24 hour management of active medical problems listed below. Physiatrist and rehab team continue to assess barriers to discharge/monitor patient progress toward functional and medical goals. FIM: FIM - Bathing Bathing Steps Patient Completed: Chest;Right Arm;Abdomen;Front perineal area;Buttocks;Left upper leg;Left lower leg (including foot) Bathing: 3: Mod-Patient completes 5-7 48f 10 parts or 50-74%  FIM - Upper Body Dressing/Undressing Upper body  dressing/undressing steps patient completed: Thread/unthread right sleeve of pullover shirt/dresss;Thread/unthread left sleeve of pullover shirt/dress;Put head through opening of pull over  shirt/dress;Pull shirt over trunk Upper body dressing/undressing: 5: Supervision: Safety issues/verbal cues FIM - Lower Body Dressing/Undressing Lower body dressing/undressing steps patient completed: Thread/unthread left underwear leg;Pull underwear up/down;Thread/unthread left pants leg;Pull pants up/down;Don/Doff left shoe;Don/Doff left sock Lower body dressing/undressing: 4: Min-Patient completed 75 plus % of tasks  FIM - Toileting Toileting steps completed by patient: Adjust clothing after toileting Toileting Assistive Devices: Grab bar or rail for support Toileting: 4: Steadying assist  FIM - Diplomatic Services operational officer Devices: Grab bars Toilet Transfers: 3-To toilet/BSC: Mod A (lift or lower assist);3-From toilet/BSC: Mod A (lift or lower assist)  FIM - Bed/Chair Transfer Bed/Chair Transfer Assistive Devices: Arm rests Bed/Chair Transfer: 4: Supine > Sit: Min A (steadying Pt. > 75%/lift 1 leg);4: Sit > Supine: Min A (steadying pt. > 75%/lift 1 leg);3: Bed > Chair or W/C: Mod A (lift or lower assist);3: Chair or W/C > Bed: Mod A (lift or lower assist)  FIM - Locomotion: Wheelchair Distance: 150 Locomotion: Wheelchair: 2: Travels 50 - 149 ft with minimal assistance (Pt.>75%) FIM - Locomotion: Ambulation Locomotion: Ambulation Assistive Devices: Walker - Eva;Orthosis Ambulation/Gait Assistance: 1: +2 Total assist (R ankle air cast, and Ace wrap for ankle DF) Locomotion: Ambulation: 1: Two helpers (pt performing 50%)  Comprehension Comprehension Mode: Auditory Comprehension: 6-Follows complex conversation/direction: With extra time/assistive device  Expression Expression Mode: Verbal Expression: 5-Set-up assist with assistive device  Social Interaction Social Interaction:  6-Interacts appropriately with others with medication or extra time (anti-anxiety, antidepressant).  Problem Solving Problem Solving: 6-Solves complex problems: With extra time  Memory Memory: 5-Recognizes or recalls 90% of the time/requires cueing < 10% of the time   Medical Problem List and Plan:  1. 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 DVT Prophylaxis/Anticoagulation: Subcutaneous lovenox. Monitor platelet counts and any signs of bleeding  3. Left neck mandible mass.  4. 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 Plan outpatient followup for revascularization with intervention radiology  5. Diabetes mellitus. Hemoglobin A1c of 10.2. Lantus insulin 15 units at bedtime. Check blood sugars a.c. and at bedtime. Patient on glucophage 1000 mg twice daily prior to admission resume 6. Hypertension. Lisinopril 40 mg daily. Monitor with increased activity  7. Tobacco abuse. Nicoderm patch.  8. Restless leg syndrome.D/C Requip  pt states she used neurontin at home worse on R due to spasticity.  Start klonopin should help for sleep as well increase dose 9. Depression.celexa. Provide emotional support and positive reinforcement  10. Hyperlipidemia. Lipitor 11.  Urinary freq  UMN bladder trial ditropan 12. Thrush. Start Nystatin   LOS (Days) 10 A FACE TO FACE EVALUATION WAS PERFORMED  Alex Rony Ratz 05/24/2011, 8:34 AM

## 2011-05-24 NOTE — Progress Notes (Signed)
Patient had incontinent episode this morning, watery stool large in amount. Also had another one this afternoon. Patient not feeling good. Afebrile. Dr Lesia Hausen notified with order for C-diff PCR. Awaiting stool sample.

## 2011-05-24 NOTE — Progress Notes (Signed)
Physical Therapy Session Note  Patient Details  Name: Chelsey Gallagher MRN: [id number] Date of Birth: October 27, 1950  Today's Date: 05/24/2011 Time: 4098-1191 Time Calculation (min): 65 min  Short Term Goals: Week 2:  PT Short Term Goal 1 (Week 2): Patient will perform transfers w/c <> bed with consistent min A to L and R side PT Short Term Goal 2 (Week 2): Patient will perform w/c mobility in home and controlled environment with supervision. PT Short Term Goal 3 (Week 2): Patient will perform w/c <> car transfer with min A PT Short Term Goal 4 (Week 2): Patient will perform gait training with appropriate orthosis and LRAD with mod A x 50 feet.  Skilled Therapeutic Interventions/Progress Updates:  Tx focused on WC mobility, RLE strengthening and control with functional mobility, and pre-gait activities. Pt sel;f-propelled WC on carpet and tile, focusing on maintaining straight path in distracting environments, needing only min A for tight turns. Pt able to manage WC brakes, leg rests, and tray with set-up assist to obtain parts. Therapist adjusted R brake to increase ease of application.  Pre-gait activities in // bars to continue swing and stance phase RLE control at hip and knee. Sit<>stand with Mod A for lifting with verbal cues for LLE placement for increased BOS. L step forward and back 2x20 with focus on alignment and control at R hip/knee with upright trunk, min A for steadying and manual facil for R hip recruitment. Step-tap to 4" step with R LE for increasing motor control with target, cues for clearing each step rather than dragging floor off step.   Pt continues to IR and ADD RLE for support in transition to stand, and had increased difficulty when RLE held in proper alignment. RLE there-ex to increase R hip ERs and ABDs: standing hip ABD with manual assist for full ROM and seated hip ABD with manual resistance. Pt compensates for R hip ABD with pelvic and trunk movements, so cued for  technique and began task in more ADD position. Trace muscle activation noted. 2x15 each ex.       Therapy Documentation Precautions:  Precautions Precautions: Fall Required Braces or Orthoses: Yes Other Brace/Splint: for gait: air cast R ankle Restrictions Weight Bearing Restrictions: No Other Position/Activity Restrictions: right hemiplegia, decreased right side sensation, Dys. 3 with thin liquids    Pain: Pain Assessment Pain Assessment: No/denies pain    Locomotion : Wheelchair Mobility Distance: 200   See FIM for current functional status  Therapy/Group: Individual Therapy  Virl Cagey, PT 05/24/2011, 10:49 AM

## 2011-05-25 LAB — GLUCOSE, CAPILLARY: Glucose-Capillary: 129 mg/dL — ABNORMAL HIGH (ref 70–99)

## 2011-05-25 NOTE — Progress Notes (Addendum)
Patient incontinent of stool this morning. Large amount and loose. Afebrile. Patient feeling weak and a little warm. Stool for c-diff PCR sent. Result (-). Another incontinent episode after few minutes. Placed on contact precaution for norovirus per protocol per infectious disease. Redness to buttock and pereneal area. epc cream applied. Dr. Posey Rea aware.

## 2011-05-25 NOTE — Progress Notes (Signed)
Patient ID: Chelsey Gallagher, female   DOB: 17-Sep-1950, 61 y.o.   MRN: [id number] Patient ID: Chelsey Gallagher, female   DOB: 1950/11/04, 61 y.o.   MRN: [id number] Patient ID: Chelsey Gallagher, female   DOB: [birth year], 61 y.o.   MRN: [id number]  Subjective/Complaints: Tired ,dry mouth but bladder less overactive. C/o ST - better this am Review of Systems  Genitourinary: Positive for urgency and frequency. Negative for dysuria.  Neurological: Positive for focal weakness.  Psychiatric/Behavioral: The patient has insomnia.   All other systems reviewed and are negative.     Objective: Vital Signs: Blood pressure 128/74, pulse 97, temperature 97.9 F (36.6 C), temperature source Oral, resp. rate 18, height 5\' 3"  (1.6 m), weight 142 lb 10.2 oz (64.7 kg), SpO2 91.00%. No results found. Results for orders placed during the hospital encounter of 05/14/11 (from the past 72 hour(s))  GLUCOSE, CAPILLARY     Status: Abnormal   Collection Time   05/22/11 11:39 AM      Component Value Range Comment   Glucose-Capillary 170 (*) 70 - 99 (mg/dL)    Comment 1 Notify RN     GLUCOSE, CAPILLARY     Status: Abnormal   Collection Time   05/22/11  4:26 PM      Component Value Range Comment   Glucose-Capillary 220 (*) 70 - 99 (mg/dL)    Comment 1 Notify RN     GLUCOSE, CAPILLARY     Status: Abnormal   Collection Time   05/22/11  9:40 PM      Component Value Range Comment   Glucose-Capillary 197 (*) 70 - 99 (mg/dL)    Comment 1 Notify RN     GLUCOSE, CAPILLARY     Status: Abnormal   Collection Time   05/23/11  7:15 AM      Component Value Range Comment   Glucose-Capillary 191 (*) 70 - 99 (mg/dL)    Comment 1 Notify RN     GLUCOSE, CAPILLARY     Status: Abnormal   Collection Time   05/23/11 12:22 PM      Component Value Range Comment   Glucose-Capillary 241 (*) 70 - 99 (mg/dL)    Comment 1 Notify RN     GLUCOSE, CAPILLARY     Status: Abnormal   Collection Time   05/23/11  9:20 PM      Component  Value Range Comment   Glucose-Capillary 173 (*) 70 - 99 (mg/dL)    Comment 1 Notify RN     GLUCOSE, CAPILLARY     Status: Abnormal   Collection Time   05/24/11  7:47 AM      Component Value Range Comment   Glucose-Capillary 176 (*) 70 - 99 (mg/dL)    Comment 1 Notify RN     GLUCOSE, CAPILLARY     Status: Abnormal   Collection Time   05/24/11 11:48 AM      Component Value Range Comment   Glucose-Capillary 190 (*) 70 - 99 (mg/dL)    Comment 1 Notify RN     GLUCOSE, CAPILLARY     Status: Abnormal   Collection Time   05/24/11  4:28 PM      Component Value Range Comment   Glucose-Capillary 138 (*) 70 - 99 (mg/dL)    Comment 1 Notify RN     GLUCOSE, CAPILLARY     Status: Abnormal   Collection Time   05/24/11  8:11 PM      Component Value Range  Comment   Glucose-Capillary 146 (*) 70 - 99 (mg/dL)    Comment 1 Notify RN     GLUCOSE, CAPILLARY     Status: Abnormal   Collection Time   05/25/11  7:40 AM      Component Value Range Comment   Glucose-Capillary 149 (*) 70 - 99 (mg/dL)    hysical Exam  Vitals reviewed.  Constitutional: She is oriented to person, place, and time. She appears well-developed.  HENT: mild thrush - better Head: Normocephalic. PERRL, EOMI  Neck: Normal range of motion. Neck supple. No thyromegaly present.  Cardiovascular: Normal rate and regular rhythm.  Pulmonary/Chest: Effort normal and breath sounds normal. She has no wheezes.  Abdominal: Soft. She exhibits no distension. There is no tenderness.  Musculoskeletal: She exhibits no edema.  Neurological: She is alert and oriented to person, place, and time.  She is somewhat hard of hearing (Right more affected than left). She wears hearing aids on either ear. Shows good awareness of deficits. Follow three-step commands.Severely Dysarthric speech. Gaze intact. Poor visual acuity overall. Right facial weakness and lower facial sensory loss with tongue deviation. A few beats of nystagmus with superior gaze and left gaze.  RUE 0'/5. RLE 2+ prox to 0/trace distally with ADF and 1+/5 APF. Left side motor and sensory nromal. Sensation 1/2 on right (feels pins and needles). She can sense pain however.  Skin: Skin is warm and dry.  Psychiatric: She has a normal mood and affect.  Mild anxiety  Assessment/Plan: 1. 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 Physiatrist is providing close team supervision and 24 hour management of active medical problems listed below. Physiatrist and rehab team continue to assess barriers to discharge/monitor patient progress toward functional and medical goals. FIM: FIM - Bathing Bathing Steps Patient Completed: Chest;Right Arm;Abdomen;Right upper leg;Left upper leg Bathing: 3: Mod-Patient completes 5-7 73f 10 parts or 50-74%  FIM - Upper Body Dressing/Undressing Upper body dressing/undressing steps patient completed: Pull shirt over trunk Upper body dressing/undressing: 1: Total-Patient completed less than 25% of tasks FIM - Lower Body Dressing/Undressing Lower body dressing/undressing steps patient completed: Pull pants up/down Lower body dressing/undressing: 1: Total-Patient completed less than 25% of tasks (incontinent episode in bed.)  FIM - Toileting Toileting steps completed by patient: Adjust clothing after toileting;Performs perineal hygiene Toileting Assistive Devices: Grab bar or rail for support Toileting: 3: Mod-Patient completed 2 of 3 steps  FIM - Diplomatic Services operational officer Devices: Grab bars Toilet Transfers: 3-To toilet/BSC: Mod A (lift or lower assist);3-From toilet/BSC: Mod A (lift or lower assist)  FIM - Bed/Chair Transfer Bed/Chair Transfer Assistive Devices: Arm rests Bed/Chair Transfer: 3: Bed > Chair or W/C: Mod A (lift or lower assist);3: Chair or W/C > Bed: Mod A (lift or lower assist)  FIM - Locomotion:  Wheelchair Distance: 200 Locomotion: Wheelchair: 4: Travels 150 ft or more: maneuvers on rugs and over door sillls with minimal assistance (Pt.>75%) FIM - Locomotion: Ambulation Locomotion: Ambulation Assistive Devices: Walker - Eva;Orthosis Ambulation/Gait Assistance: 1: +2 Total assist (R ankle air cast, and Ace wrap for ankle DF) Locomotion: Ambulation: 0: Activity did not occur (NO second helper avaialble)  Comprehension Comprehension Mode: Auditory Comprehension: 6-Follows complex conversation/direction: With extra time/assistive device  Expression Expression Mode: Verbal Expression: 5-Set-up assist with assistive device  Social Interaction Social Interaction: 6-Interacts appropriately with others with medication or extra time (anti-anxiety, antidepressant).  Problem  Solving Problem Solving: 6-Solves complex problems: With extra time  Memory Memory: 5-Recognizes or recalls 90% of the time/requires cueing < 10% of the time   Medical Problem List and Plan:  1. 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 DVT Prophylaxis/Anticoagulation: Subcutaneous lovenox. Monitor platelet counts and any signs of bleeding  3. Left neck mandible mass.  4. 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 Plan outpatient followup for revascularization with intervention radiology  5. Diabetes mellitus. Hemoglobin A1c of 10.2. Lantus insulin 15 units at bedtime. Check blood sugars a.c. and at bedtime. Patient on glucophage 1000 mg twice daily prior to admission resume 6. Hypertension. Lisinopril 40 mg daily. Monitor with increased activity  7. Tobacco abuse. Nicoderm patch.  8. Restless leg syndrome.D/C Requip  pt states she used neurontin at home worse on R due to spasticity.  Start klonopin should help for sleep as well increase dose 9. Depression.celexa. Provide emotional support and positive reinforcement  10. Hyperlipidemia. Lipitor 11.  Urinary freq  UMN bladder trial ditropan 12. Thrush.  Start Nystatin - better   LOS (Days) 11 A FACE TO FACE EVALUATION WAS PERFORMED  Alex Danene Montijo 05/25/2011, 9:06 AM

## 2011-05-25 NOTE — Progress Notes (Addendum)
Occupational Therapy Session Note  Patient Details  Name: Chelsey Gallagher MRN: [id number] Date of Birth: [birth year]  Today's Date: 05/25/2011 Time: 1000-1055 Treatment time: 55 minutes   Short Term Goals: Week 1:  OT Short Term Goal 1 (Week 1): Pt will complete UB dressing with min assist OT Short Term Goal 1 - Progress (Week 1): Met OT Short Term Goal 2 (Week 1): Pt will complete LB dressing with mod assist OT Short Term Goal 2 - Progress (Week 1): Met OT Short Term Goal 3 (Week 1): Pt will complete bathing with min assist OT Short Term Goal 3 - Progress (Week 1): Met OT Short Term Goal 4 (Week 1): Pt will complete toilet transfer with min assist OT Short Term Goal 4 - Progress (Week 1): Progressing toward goal OT Short Term Goal 5 (Week 1): Pt will complete toileting with mod assist (2 of 3 steps) OT Short Term Goal 5 - Progress (Week 1): Progressing toward goal  Skilled Therapeutic Interventions/Progress Updates:  ADL-retraining at w/c level, sink side, with emphasis on attention to flaccid right UE, dynamic sitting balance, bed mobility, and safety during transfers.   Patient reported having diarrhea prior to ADL session, with RN assist for lower body bathing/dressing but was receptive to upper body bathing/dressing at sink, grooming and hygiene, and assist with donning right LE orthotics. Patient demo's instability with dynamic sitting activity when unsupported (at edge of bed) but is aware of right UE during bathing, using sink surface to support right UE.   Transfers require moderate assist (lifting and lower) for safety due to inattention to right and impaired dynamic sitting balance.     Therapy Documentation Precautions:  Precautions Precautions: Fall Required Braces or Orthoses: Yes Other Brace/Splint: for gait: air cast R ankle Restrictions Weight Bearing Restrictions: No Other Position/Activity Restrictions: right hemiplegia, decreased right side sensation, Dys. 3 with  thin liquids  Vital Signs: Therapy Vitals Temp: 98.8 F (37.1 C) Temp src: Oral Pulse Rate: 87  Resp: 19  BP: 129/73 mmHg Patient Position, if appropriate: Lying Oxygen Therapy SpO2: 92 % O2 Device: None (Room air)  ADL: ADL Grooming: Minimal assistance Where Assessed-Grooming: Sitting at sink Upper Body Bathing: Minimal assistance Where Assessed-Upper Body Bathing: Shower Lower Body Bathing: Minimal assistance Where Assessed-Lower Body Bathing: Shower Upper Body Dressing: Minimal assistance Where Assessed-Upper Body Dressing: Sitting at sink Lower Body Dressing: Moderate assistance Where Assessed-Lower Body Dressing: Sitting at sink;Standing at sink Toileting: Maximal assistance Where Assessed-Toileting: Teacher, adult education: Moderate assistance Toilet Transfer Method: Stand pivot Toilet Transfer Equipment: Engineer, technical sales: Moderate assistance (simulated) Tub/Shower Transfer Method: Stand pivot Tub/Shower Equipment: Insurance underwriter: Moderate assistance Film/video editor Method: Warden/ranger: Shower seat with back  See FIM for current functional status  Therapy: Individual Therapy  Georgeanne Nim 05/25/2011, 4:09 PM

## 2011-05-25 NOTE — Progress Notes (Signed)
Patient complains of nausea at 2056, reported to patient room, offered anti-nausea medication, patient refused. Patient then complained of restless leg at 2103, medicated patient with Robaxin 500 mg and Tylenol 650 mg along with scheduled Neurotin 800 mg. Patient reported at home she used heat and would sit upright to help relieve the spasms and twitching. Gave patient a heat pack and assisted patient to sit upright in the bed, and patient red book. Rechecked patient x2 since giving medication and reported some decreased effects. Patient fell asleep around 2330.

## 2011-05-26 LAB — GLUCOSE, CAPILLARY: Glucose-Capillary: 142 mg/dL — ABNORMAL HIGH (ref 70–99)

## 2011-05-26 MED ORDER — TIZANIDINE HCL 2 MG PO TABS
2.0000 mg | ORAL_TABLET | Freq: Every day | ORAL | Status: DC
  Administered 2011-05-26: 2 mg via ORAL
  Filled 2011-05-26 (×2): qty 1

## 2011-05-26 NOTE — Progress Notes (Addendum)
Occupational Therapy Session Note  Patient Details  Name: Chelsey Gallagher MRN: [id number] Date of Birth: 01/08/1951  Today's Date: 05/26/2011 Time: 1030-1045 and 1330-1400 Time Calculation (min): 15 min and 30 min  Short Term Goals: Week 2:  OT Short Term Goal 1 (Week 2): Pt will complete toilet transfer with min assist OT Short Term Goal 2 (Week 2): Pt will complete toileting with mod assist (2 of 3 steps) OT Short Term Goal 3 (Week 2): Pt will complete LB dressing with min assist OT Short Term Goal 4 (Week 2): Pt will complete grooming with min assist in standing at sink OT Short Term Goal 5 (Week 2): Pt will complete tub/shower transfer with min assist  Skilled Therapeutic Interventions/Progress Updates:    1) Pt reports tension like headache, premedicated.  Pt refused getting OOB this session secondary to feeling nauseous and headache.  Over the weekend pt with headache, stomachache, fever, diarrhea.  Pt reports feeling a little better today but not wanting to get OOB.  Focus session on repositioning pt in bed, encouraging intake of fluids to maintain hydration, and cool cloth to wash face and attempt to ease clammy feeling.   2) Pt agreeable to attempting to get OOB.  She requested to bathe at sink to see if that would help her to feel better.  Once OOB, pt reports needing to toilet.  Focus on toilet transfers with decreased assist and focus on safety with stand pivot transfer.  Pt required assist with clothing maintenance but completed hygiene with steady assist.  Focus on sit <> stand at sink with hand placement and weight shifting to obtain and regain balance in standing while completing LB dressing.  Therapy Documentation Precautions:  Precautions Precautions: Fall Required Braces or Orthoses: Yes Other Brace/Splint: for gait: air cast R ankle Restrictions Weight Bearing Restrictions: No Other Position/Activity Restrictions: right hemiplegia, decreased right side sensation,  Dys. 3 with thin liquids General: General Amount of Missed OT Time (min): 45 Minutes Vital Signs:   Pain: Pain Assessment Pain Assessment: 0-10 Pain Score:   3 Pain Type: Acute pain Pain Location: Head Pain Orientation: Anterior;Posterior  See FIM for current functional status  Therapy/Group: Individual Therapy  [name] 05/26/2011, 11:26 AM

## 2011-05-26 NOTE — Progress Notes (Signed)
Occupational Therapy Note  Patient Details  Name: Izzie Geers MRN: [id number] Date of Birth: 10/18/1950 Today's Date: 05/26/2011  Pt missed 45 mins skilled OT session this AM secondary to feeling very sick and not wanting to get OOB.  Pt reports having diarrhea, headache, fever over the weekend.  RN notified.     [name] 05/26/2011, 3:46 PM

## 2011-05-26 NOTE — Plan of Care (Signed)
Problem: RH BOWEL ELIMINATION Goal: RH STG MANAGE BOWEL W/MEDICATION W/ASSISTANCE STG Manage Bowel with Medication with mod independence  Outcome: Progressing Refused Senna today secondary to loose stools over the weekend

## 2011-05-26 NOTE — Progress Notes (Signed)
Per State Regulation 482.30 This chart was reviewed for medical necessity with respect to the patient's Admission/Duration of stay. Pt having nausea, diarrhea with incontinence and headache x 3 days affecting ability to fully participate in therapies. Afebrile. Negative for c-diff. Poor intake. Increased leg spasticity, med changed to Zanaflex.  Meryl Dare                 Nurse Care Manager            Next Review Date: 05/29/11

## 2011-05-26 NOTE — Progress Notes (Signed)
Physical Therapy Note  Patient Details  Name: Chelsey Gallagher MRN: [id number] Date of Birth: [birth year] Today's Date: 05/26/2011  1430-1510 (40 minutes) individual Pain- no complaint of pain Precautions : Contact Focus of treatment: Gait training with appropriate assistive device Treatment: Gait- 30 feet  X 3 PFRW (right) mod assist using aircast+ ace wrap RT ankle. Mod assist for balance, guiding AD. Pt advances Rt LE without assist with scissoring pattern. Sit to stand min/mod assist.  Aundrea Higginbotham,JIM 05/26/2011, 3:11 PM

## 2011-05-26 NOTE — Progress Notes (Signed)
Physical Therapy Session Note  Patient Details  Name: Chelsey Gallagher MRN: [id number] Date of Birth: [birth year]  Today's Date: 05/26/2011 Time: 3664-4034 Time Calculation (min): 30 min  Pt stating she feels awful and has been sick the last couple days. Pt declined OOB but worked on putting her socks on and applying the brace for the R ankle. Positioning and sitting upright for general activity tolerance. Educated on importance of fluid intake and trying to eat her meals, stating she hasn't had much of an appetite either.     Therapy Documentation Precautions:  Precautions Precautions: Fall Required Braces or Orthoses: Yes Other Brace/Splint: for gait: air cast R ankle Restrictions Weight Bearing Restrictions: No Other Position/Activity Restrictions: right hemiplegia, decreased right side sensation, Dys. 3 with thin liquids Pain: Denies pain, just states she feels very weak, tired, and sick    See FIM for current functional status  Therapy/Group: Individual Therapy  Karolee Stamps [facility] 05/26/2011, 11:50 AM

## 2011-05-26 NOTE — Plan of Care (Signed)
Problem: RH KNOWLEDGE DEFICIT Goal: RH STG INCREASE KNOWLEDGE OF DIABETES Pt and caregiver will be able to verbalize medications used to manage Diabetes Pt and Caregiver will be able to verbalize S/S hypoglycemia and hyperglycemia Pt and Caregiver will be able to return demonstrate use of personal CBG machine Pt and Caregiver will be able to return demonstrate insulin administration of Lantus with pen as pt was using pen PTA..  Outcome: Progressing Pt can verbalize medications, can demnostrate use of pen and CBG machine with assistance of holding equipment to prepare, has not done actual injection No caregiver present to demostrate or verbalize, did take copy of written handouts with him(Allen) to review at home.

## 2011-05-26 NOTE — Progress Notes (Signed)
Speech Language Pathology Daily Session Note  Patient Details  Name: Chelsey Gallagher MRN: [id number] Date of Birth: October 12, 1950  Today's Date: 05/26/2011 Time: 0800-0830 Time Calculation (min): 30 min  Short Term Goals:  SLP Short Term Goal 1 (Week 2): Patient will consume regular textures and thin liquids via straw with modified independence and no overt s/s of asipraiton  SLP Short Term Goal 2 (Week 2): Patient will consume trials of medication with thin liquids with no overt s/s of aspiraiton and supervision semantic cues SLP Short Term Goal 3 (Week 2): Patient will utilize slow over articulated speech at the conversational level with 98% aintelligibility with modified independenceI SLP Short Term Goal 4 (Week 2): Patient will self correct speech errors as needed with modified independence during conversation  Skilled Therapeutic Interventions: Treatment focus on swallowing safety with regular textures, thin liquids and mixed consistencies (cereal). Pt required supervision verbal cues to utilize small bites but did not demonstrate any overt s/s of aspiration with any consistency.   Daily Session FIM:  Comprehension Comprehension Mode: Auditory Comprehension: 6-Follows complex conversation/direction: With extra time/assistive device Expression Expression Mode: Verbal Expression: 5-Expresses complex 90% of the time/cues < 10% of the time Social Interaction Social Interaction: 6-Interacts appropriately with others with medication or extra time (anti-anxiety, antidepressant). Problem Solving Problem Solving: 6-Solves complex problems: With extra time Memory Memory: 5-Recognizes or recalls 90% of the time/requires cueing < 10% of the time FIM - Eating Eating Activity: 5: Set-up assist for open containers  Pain: No/Denies Pain   Therapy/Group: Individual Therapy  Deliana Avalos 05/26/2011, 3:41 PM

## 2011-05-26 NOTE — Plan of Care (Signed)
Problem: RH BOWEL ELIMINATION Goal: RH STG MANAGE BOWEL WITH ASSISTANCE STG Manage Bowel with mod independence  Outcome: Progressing No BM today, loose stool over weekend, C Diff negative

## 2011-05-26 NOTE — Progress Notes (Signed)
Nutrition Follow-up  Pt upgraded to CHO Modified Medium diet (from Dys 3) on 3/20. Intake 50 - 75%.  Diet Order:  CHO Modified Medium Supplement: Resource Breeze PO daily  Meds: Scheduled Meds:   . aspirin  81 mg Oral Daily  . atorvastatin  40 mg Oral q1800  . citalopram  20 mg Oral Daily  . enoxaparin  40 mg Subcutaneous Q24H  . feeding supplement  1 Container Oral Daily  . gabapentin  800 mg Oral QHS  . insulin aspart  0-15 Units Subcutaneous TID WC  . insulin glargine  15 Units Subcutaneous QHS  . lisinopril  40 mg Oral Daily  . metFORMIN  500 mg Oral BID WC  . nicotine  21 mg Transdermal Daily  . nystatin  500,000 Units Oral Q8H  . oxybutynin  2.5 mg Oral BID  . senna  1 tablet Oral BID  . tiZANidine  2 mg Oral QHS  . DISCONTD: clonazePAM  1 mg Oral QHS   Continuous Infusions:  PRN Meds:.acetaminophen, bisacodyl, methocarbamol, ondansetron (ZOFRAN) IV, polyethylene glycol, sorbitol, traZODone  Labs:  CMP     Component Value Date/Time   NA 135 05/15/2011 0650   K 4.1 05/15/2011 0650   CL 97 05/15/2011 0650   CO2 31 05/15/2011 0650   GLUCOSE 141* 05/15/2011 0650   BUN 14 05/15/2011 0650   CREATININE 0.56 05/15/2011 0650   CALCIUM 10.0 05/15/2011 0650   PROT 6.9 05/15/2011 0650   ALBUMIN 3.4* 05/15/2011 0650   AST 27 05/15/2011 0650   ALT 27 05/15/2011 0650   ALKPHOS 95 05/15/2011 0650   BILITOT 0.7 05/15/2011 0650   GFRNONAA >90 05/15/2011 0650   GFRAA >90 05/15/2011 0650   CBG (last 3)   Basename 05/26/11 0719 05/25/11 2126 05/25/11 1650  GLUCAP 146* 129* 162*     Intake/Output Summary (Last 24 hours) at 05/26/11 1131 Last data filed at 05/26/11 0900  Gross per 24 hour  Intake    940 ml  Output    650 ml  Net    290 ml    Weight Status:  64.7 kg, wt down 2.4 kg x 2 weeks  Estimated needs:  1550 - 1800 kcal, 80 - 90 grams protein, 1.6 - 1.8 L/d  Nutrition Dx:  Inadequate oral intake r/t poor appetite AEB pt report. Resolved.  Goal:  Pt to consume >/= 75% of  meals and supplements. Met on average.  Intervention:  Continue current interventions.  Monitor:  Weights, labs, PO intake, I/O's  Adair Laundry Pager #:  [phone]

## 2011-05-26 NOTE — Progress Notes (Signed)
Patient ID: Chelsey Gallagher, female   DOB: 1950-09-23, 61 y.o.   MRN: [id number]  Subjective/Complaints: Achy and tired, no BMs since yesterday, still has leg spasms at night Review of Systems  Genitourinary: Positive for urgency and frequency. Negative for dysuria.  Neurological: Positive for focal weakness.  Psychiatric/Behavioral: The patient has insomnia.   All other systems reviewed and are negative.     Objective: Vital Signs: Blood pressure 148/79, pulse 85, temperature 97.5 F (36.4 C), temperature source Oral, resp. rate 18, height 5\' 3"  (1.6 m), weight 64.7 kg (142 lb 10.2 oz), SpO2 91.00%. No results found. Results for orders placed during the hospital encounter of 05/14/11 (from the past 72 hour(s))  GLUCOSE, CAPILLARY     Status: Abnormal   Collection Time   05/23/11 12:22 PM      Component Value Range Comment   Glucose-Capillary 241 (*) 70 - 99 (mg/dL)    Comment 1 Notify RN     GLUCOSE, CAPILLARY     Status: Abnormal   Collection Time   05/23/11  9:20 PM      Component Value Range Comment   Glucose-Capillary 173 (*) 70 - 99 (mg/dL)    Comment 1 Notify RN     GLUCOSE, CAPILLARY     Status: Abnormal   Collection Time   05/24/11  7:47 AM      Component Value Range Comment   Glucose-Capillary 176 (*) 70 - 99 (mg/dL)    Comment 1 Notify RN     GLUCOSE, CAPILLARY     Status: Abnormal   Collection Time   05/24/11 11:48 AM      Component Value Range Comment   Glucose-Capillary 190 (*) 70 - 99 (mg/dL)    Comment 1 Notify RN     GLUCOSE, CAPILLARY     Status: Abnormal   Collection Time   05/24/11  4:28 PM      Component Value Range Comment   Glucose-Capillary 138 (*) 70 - 99 (mg/dL)    Comment 1 Notify RN     GLUCOSE, CAPILLARY     Status: Abnormal   Collection Time   05/24/11  8:11 PM      Component Value Range Comment   Glucose-Capillary 146 (*) 70 - 99 (mg/dL)    Comment 1 Notify RN     GLUCOSE, CAPILLARY     Status: Abnormal   Collection Time   05/25/11   7:40 AM      Component Value Range Comment   Glucose-Capillary 149 (*) 70 - 99 (mg/dL)   CLOSTRIDIUM DIFFICILE BY PCR     Status: Normal   Collection Time   05/25/11  9:35 AM      Component Value Range Comment   C difficile by pcr NEGATIVE  NEGATIVE    GLUCOSE, CAPILLARY     Status: Abnormal   Collection Time   05/25/11 11:49 AM      Component Value Range Comment   Glucose-Capillary 171 (*) 70 - 99 (mg/dL)   GLUCOSE, CAPILLARY     Status: Abnormal   Collection Time   05/25/11  4:50 PM      Component Value Range Comment   Glucose-Capillary 162 (*) 70 - 99 (mg/dL)    Comment 1 Notify RN     GLUCOSE, CAPILLARY     Status: Abnormal   Collection Time   05/25/11  9:26 PM      Component Value Range Comment   Glucose-Capillary 129 (*) 70 - 99 (  mg/dL)   GLUCOSE, CAPILLARY     Status: Abnormal   Collection Time   05/26/11  7:19 AM      Component Value Range Comment   Glucose-Capillary 146 (*) 70 - 99 (mg/dL)    Comment 1 Notify RN      hysical Exam  Vitals reviewed.  Constitutional: She is oriented to person, place, and time. She appears well-developed.  HENT: Throat clr no thrush Head: Normocephalic. PERRL, EOMI  Neck: Normal range of motion. Neck supple. No thyromegaly present.  Cardiovascular: Normal rate and regular rhythm.  Pulmonary/Chest: Effort normal and breath sounds normal. She has no wheezes.  Abdominal: Soft. She exhibits no distension. There is no tenderness.  Musculoskeletal: She exhibits no edema.  Neurological: She is alert and oriented to person, place, and time.  She is somewhat hard of hearing (Right more affected than left). She wears hearing aids on either ear. Shows good awareness of deficits. Follow three-step commands.Severely Dysarthric speech. Gaze intact. Poor visual acuity overall. Right facial weakness and lower facial sensory loss with tongue deviation.  RUE 0'/5. RLE 2+ prox to 0/trace distally with ADF and 1+/5 APF. Left side motor and sensory nromal. . She  can sense pain however.  Skin: Skin is warm and dry.  Psychiatric: She has a normal mood and affect.  Mild anxiety  Assessment/Plan: 1. 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 Physiatrist is providing close team supervision and 24 hour management of active medical problems listed below. Physiatrist and rehab team continue to assess barriers to discharge/monitor patient progress toward functional and medical goals. FIM: FIM - Bathing Bathing Steps Patient Completed: Chest;Right Arm;Abdomen Bathing: 3: Mod-Patient completes 5-7 73f 10 parts or 50-74%  FIM - Upper Body Dressing/Undressing Upper body dressing/undressing steps patient completed: Thread/unthread right sleeve of pullover shirt/dresss;Thread/unthread left sleeve of pullover shirt/dress;Put head through opening of pull over shirt/dress;Pull shirt over trunk Upper body dressing/undressing: 5: Set-up assist to: Obtain clothing/put away FIM - Lower Body Dressing/Undressing Lower body dressing/undressing steps patient completed: Pull pants up/down Lower body dressing/undressing: 0: Activity did not occur  FIM - Toileting Toileting steps completed by patient: Adjust clothing after toileting;Performs perineal hygiene Toileting Assistive Devices: Grab bar or rail for support Toileting: 1: Total-Patient completed zero steps, helper did all 3 (sick today, very weak. bedpan)  FIM - Diplomatic Services operational officer Devices: Grab bars Toilet Transfers: 3-To toilet/BSC: Mod A (lift or lower assist)  FIM - Banker Devices: Bed rails;Arm rests Bed/Chair Transfer: 3: Bed > Chair or W/C: Mod A (lift or lower assist)  FIM - Locomotion: Wheelchair Distance: 200 Locomotion: Wheelchair: 4: Travels 150 ft or more: maneuvers on rugs and over door sillls with  minimal assistance (Pt.>75%) FIM - Locomotion: Ambulation Locomotion: Ambulation Assistive Devices: Walker - Eva;Orthosis Ambulation/Gait Assistance: 1: +2 Total assist (R ankle air cast, and Ace wrap for ankle DF) Locomotion: Ambulation: 0: Activity did not occur (NO second helper avaialble)  Comprehension Comprehension Mode: Auditory Comprehension: 6-Follows complex conversation/direction: With extra time/assistive device  Expression Expression Mode: Verbal Expression: 5-Set-up assist with assistive device  Social Interaction Social Interaction: 6-Interacts appropriately with others with medication or extra time (anti-anxiety, antidepressant).  Problem Solving Problem Solving: 6-Solves complex problems: With extra time  Memory Memory: 5-Recognizes or recalls 90% of the time/requires cueing < 10% of the time   Medical Problem List and  Plan:  1. 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 DVT Prophylaxis/Anticoagulation: Subcutaneous lovenox. Monitor platelet counts and any signs of bleeding  3. Left neck mandible mass benign.  4. 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 Plan outpatient followup for revascularization with intervention radiology  5. Diabetes mellitus. Hemoglobin A1c of 10.2. Lantus insulin 15 units at bedtime. Check blood sugars a.c. and at bedtime. Patient on glucophage 1000 mg twice daily prior to admission resume 6. Hypertension. Lisinopril 40 mg daily. Monitor with increased activity  7. Tobacco abuse. Nicoderm patch.  8. Restless leg syndrome.this is complicated by spasticity, will start zanaflex 9. Depression.celexa. Provide emotional support and positive reinforcement  10. Hyperlipidemia. Lipitor 11.  Urinary freq  UMN bladder trial ditropan P   LOS (Days) 12 A FACE TO FACE EVALUATION WAS PERFORMED  Trevante Tennell E 05/26/2011, 8:25 AM

## 2011-05-27 DIAGNOSIS — G8110 Spastic hemiplegia affecting unspecified side: Secondary | ICD-10-CM

## 2011-05-27 DIAGNOSIS — I6330 Cerebral infarction due to thrombosis of unspecified cerebral artery: Secondary | ICD-10-CM

## 2011-05-27 DIAGNOSIS — Z5189 Encounter for other specified aftercare: Secondary | ICD-10-CM

## 2011-05-27 LAB — GLUCOSE, CAPILLARY
Glucose-Capillary: 125 mg/dL — ABNORMAL HIGH (ref 70–99)
Glucose-Capillary: 155 mg/dL — ABNORMAL HIGH (ref 70–99)
Glucose-Capillary: 114 mg/dL — ABNORMAL HIGH (ref 70–99)

## 2011-05-27 MED ORDER — LISINOPRIL 20 MG PO TABS
20.0000 mg | ORAL_TABLET | Freq: Every day | ORAL | Status: DC
  Administered 2011-05-27 – 2011-06-01 (×5): 20 mg via ORAL
  Filled 2011-05-27 (×8): qty 1

## 2011-05-27 MED ORDER — TIZANIDINE HCL 4 MG PO TABS
4.0000 mg | ORAL_TABLET | Freq: Every day | ORAL | Status: DC
  Administered 2011-05-27: 4 mg via ORAL
  Filled 2011-05-27 (×3): qty 1

## 2011-05-27 MED ORDER — SILVER SULFADIAZINE 1 % EX CREA
TOPICAL_CREAM | Freq: Three times a day (TID) | CUTANEOUS | Status: DC
Start: 2011-05-27 — End: 2011-06-04
  Administered 2011-05-27 – 2011-06-03 (×15): via TOPICAL
  Filled 2011-05-27: qty 85

## 2011-05-27 NOTE — Progress Notes (Signed)
Occupational Therapy Session Note  Patient Details  Name: Chelsey Gallagher MRN: [id number] Date of Birth: 06-08-1950  Today's Date: 05/27/2011 Time: 4098-1191 and 4782-9562 Time Calculation (min): 60 min and 30 min  Short Term Goals: Week 2:  OT Short Term Goal 1 (Week 2): Pt will complete toilet transfer with min assist OT Short Term Goal 2 (Week 2): Pt will complete toileting with mod assist (2 of 3 steps) OT Short Term Goal 3 (Week 2): Pt will complete LB dressing with min assist OT Short Term Goal 4 (Week 2): Pt will complete grooming with min assist in standing at sink OT Short Term Goal 5 (Week 2): Pt will complete tub/shower transfer with min assist  Skilled Therapeutic Interventions/Progress Updates:    1) Pt seen for ADL retraining with focus on stand pivot transfer to/from shower chair in walk-in shower.  Focus on RLE with sit <> stand to decrease internal rotation at hip and knee with sit to stand, pt requires blocking at Rt knee to decrease rotation.  Pt with improved participation today, stating that she feels much better.  Pt required assist with pulling up pants and steady assist in standing to maintain midline orientation with use of LUE on sink and visual cues from mirror.    2) Engaged in NM re-ed with focus on sit to stand and stand to sit with tactile cues at Rt knee to decrease tendency to rotate knee and hip inward with sit to stand.  Pt continues to require min assist with sit to stand and tactile cues at knee.  Engaged in squats and weight bearing through RUE with reaching activity to increase independence with sit to stands and promote awareness through RUE and engagement in tasks.  Therapy Documentation Precautions:  Precautions Precautions: Fall Required Braces or Orthoses: Yes Other Brace/Splint: for gait: air cast R ankle Restrictions Weight Bearing Restrictions: No Other Position/Activity Restrictions: right hemiplegia, decreased right side sensation, Dys.  3 with thin liquids Pain: Pain Assessment Pain Assessment: No/denies pain Pain Score: 0-No pain  See FIM for current functional status  Therapy/Group: Individual Therapy  [name] 05/27/2011, 10:29 AM

## 2011-05-27 NOTE — Progress Notes (Signed)
Physical Therapy Session Note  Patient Details  Name: Chelsey Gallagher MRN: [id number] Date of Birth: Feb 07, 1951  Today's Date: 05/27/2011 Time: 0830-0930 Time Calculation (min): 60 min  Short Term Goals:  Week 2:  PT Short Term Goal 1 (Week 2): Patient will perform transfers w/c <> bed with consistent min A to L and R side PT Short Term Goal 2 (Week 2): Patient will perform w/c mobility in home and controlled environment with supervision. PT Short Term Goal 3 (Week 2): Patient will perform w/c <> car transfer with min A PT Short Term Goal 4 (Week 2): Patient will perform gait training with appropriate orthosis and LRAD with mod A x 50 feet.  Skilled Therapeutic Interventions/Progress Updates: Treatment focused on neuromuscular re-education of trunk,  RUE and RLE via forced use, demo, VC and manual cues; therapeutic activities in sitting and standing challenging balance, transfer and gait training.  W/c> mat with mod A, focusing on forward wt shift, R hip control, and wt bearing through RLE.   Started pt ed on RLE hamstring and heel cord stretching to decrease spasticity for comfort and improved RLE wt bearing in standing, using a strap with foot propped on a stool. Therapeutic activities in standing for dynamic standing balance, with mod a for LOB backward and intermittent R knee buckling during LUE activity on table.  Gait training with PFRW x 72' with min-mod A, with R ankle brace and ACE wrap on to compensate for R ankle weakness and spasticity, focusing on RLE control.  W/c mobility in 1 arm drive using LUE and occasionally LLE, x 160' x 2 with supervision.        Therapy Documentation Precautions:  Precautions Precautions: Fall Required Braces or Orthoses: Yes Other Brace/Splint: for gait: air cast R ankle Restrictions Weight Bearing Restrictions: No Other Position/Activity Restrictions: right hemiplegia, decreased right side sensation, Dys. 3 with thin  liquids General:  Pain: Pain Assessment Pain Assessment: No/denies pain Pain Score: 0-No pain     See FIM for current functional status  Therapy/Group: Individual Therapy  [name] 05/27/2011, 10:49 AM

## 2011-05-27 NOTE — Progress Notes (Signed)
At 1545, Patient called nurse to room, reported that she had spilled "hot" coffee down her leg. Assisted patient to remove clothing and applied cold damp wash cloths. MD notified shortly after and received new orders. Left upper thigh reddened without any blisters or open skin. Will continue to monitor and treat as ordered.

## 2011-05-27 NOTE — Progress Notes (Signed)
Patient ID: Chelsey Gallagher, female   DOB: 09/26/1950, 61 y.o.   MRN: [id number]  Subjective/Complaints: Headache last noc no BMs since Sunday no nausea, still has leg spasms at night Review of Systems  Genitourinary: Positive for urgency and frequency. Negative for dysuria.  Neurological: Positive for focal weakness.  Psychiatric/Behavioral: The patient has insomnia.   All other systems reviewed and are negative.     Objective: Vital Signs: Blood pressure 93/54, pulse 82, temperature 97.5 F (36.4 C), temperature source Oral, resp. rate 18, height 5\' 3"  (1.6 m), weight 64.7 kg (142 lb 10.2 oz), SpO2 96.00%. No results found. Results for orders placed during the hospital encounter of 05/14/11 (from the past 72 hour(s))  GLUCOSE, CAPILLARY     Status: Abnormal   Collection Time   05/24/11  7:47 AM      Component Value Range Comment   Glucose-Capillary 176 (*) 70 - 99 (mg/dL)    Comment 1 Notify RN     GLUCOSE, CAPILLARY     Status: Abnormal   Collection Time   05/24/11 11:48 AM      Component Value Range Comment   Glucose-Capillary 190 (*) 70 - 99 (mg/dL)    Comment 1 Notify RN     GLUCOSE, CAPILLARY     Status: Abnormal   Collection Time   05/24/11  4:28 PM      Component Value Range Comment   Glucose-Capillary 138 (*) 70 - 99 (mg/dL)    Comment 1 Notify RN     GLUCOSE, CAPILLARY     Status: Abnormal   Collection Time   05/24/11  8:11 PM      Component Value Range Comment   Glucose-Capillary 146 (*) 70 - 99 (mg/dL)    Comment 1 Notify RN     GLUCOSE, CAPILLARY     Status: Abnormal   Collection Time   05/25/11  7:40 AM      Component Value Range Comment   Glucose-Capillary 149 (*) 70 - 99 (mg/dL)   CLOSTRIDIUM DIFFICILE BY PCR     Status: Normal   Collection Time   05/25/11  9:35 AM      Component Value Range Comment   C difficile by pcr NEGATIVE  NEGATIVE    GLUCOSE, CAPILLARY     Status: Abnormal   Collection Time   05/25/11 11:49 AM      Component Value Range  Comment   Glucose-Capillary 171 (*) 70 - 99 (mg/dL)   GLUCOSE, CAPILLARY     Status: Abnormal   Collection Time   05/25/11  4:50 PM      Component Value Range Comment   Glucose-Capillary 162 (*) 70 - 99 (mg/dL)    Comment 1 Notify RN     GLUCOSE, CAPILLARY     Status: Abnormal   Collection Time   05/25/11  9:26 PM      Component Value Range Comment   Glucose-Capillary 129 (*) 70 - 99 (mg/dL)   GLUCOSE, CAPILLARY     Status: Abnormal   Collection Time   05/26/11  7:19 AM      Component Value Range Comment   Glucose-Capillary 146 (*) 70 - 99 (mg/dL)    Comment 1 Notify RN     GLUCOSE, CAPILLARY     Status: Abnormal   Collection Time   05/26/11 11:18 AM      Component Value Range Comment   Glucose-Capillary 142 (*) 70 - 99 (mg/dL)    Comment 1 Notify  RN     GLUCOSE, CAPILLARY     Status: Normal   Collection Time   05/26/11  9:19 PM      Component Value Range Comment   Glucose-Capillary 87  70 - 99 (mg/dL)    Comment 1 Notify RN      hysical Exam  Vitals reviewed.  Constitutional: She is oriented to person, place, and time. She appears well-developed.  HENT: Throat clr no thrush Head: Normocephalic. PERRL, EOMI  Neck: Normal range of motion. Neck supple. No thyromegaly present.  Cardiovascular: Normal rate and regular rhythm.  Pulmonary/Chest: Effort normal and breath sounds normal. She has no wheezes.  Abdominal: Soft. She exhibits no distension. There is no tenderness.  Musculoskeletal: She exhibits no edema.  Neurological: She is alert and oriented to person, place, and time.  She is somewhat hard of hearing (Right more affected than left). She wears hearing aids on either ear. Shows good awareness of deficits. Follow three-step commands.Severely Dysarthric speech. Gaze intact. Poor visual acuity overall. Right facial weakness and lower facial sensory loss with tongue deviation.  RUE 0'/5. RLE 2+ prox to 0/trace distally with ADF and 1+/5 APF. Left side motor and sensory nromal. .  She can sense pain however.  Skin: Skin is warm and dry.  Psychiatric: She has a normal mood and affect.  Mild anxiety  Assessment/Plan: 1. 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 Physiatrist is providing close team supervision and 24 hour management of active medical problems listed below. Physiatrist and rehab team continue to assess barriers to discharge/monitor patient progress toward functional and medical goals. FIM: FIM - Bathing Bathing Steps Patient Completed: Chest;Right Arm;Left Arm;Abdomen;Front perineal area;Right upper leg;Left upper leg Bathing: 3: Mod-Patient completes 5-7 2f 10 parts or 50-74%  FIM - Upper Body Dressing/Undressing Upper body dressing/undressing steps patient completed: Thread/unthread right sleeve of pullover shirt/dresss;Thread/unthread left sleeve of pullover shirt/dress;Put head through opening of pull over shirt/dress;Pull shirt over trunk Upper body dressing/undressing: 5: Supervision: Safety issues/verbal cues FIM - Lower Body Dressing/Undressing Lower body dressing/undressing steps patient completed: Thread/unthread left pants leg;Pull pants up/down Lower body dressing/undressing: 1: Total-Patient completed less than 25% of tasks (sick today, very weak)  FIM - Toileting Toileting steps completed by patient: Performs perineal hygiene Toileting Assistive Devices: Grab bar or rail for support Toileting: 2: Max-Patient completed 1 of 3 steps (sick today, weak)  FIM - Diplomatic Services operational officer Devices: Grab bars Toilet Transfers: 3-To toilet/BSC: Mod A (lift or lower assist);3-From toilet/BSC: Mod A (lift or lower assist)  FIM - Bed/Chair Transfer Bed/Chair Transfer Assistive Devices: Arm rests;Bed rails Bed/Chair Transfer: 4: Supine > Sit: Min A (steadying Pt. > 75%/lift 1 leg);3: Bed > Chair or  W/C: Mod A (lift or lower assist) (sick today, weak)  FIM - Locomotion: Wheelchair Distance: 200 Locomotion: Wheelchair: 4: Travels 150 ft or more: maneuvers on rugs and over door sillls with minimal assistance (Pt.>75%) FIM - Locomotion: Ambulation Locomotion: Ambulation Assistive Devices: Walker - Eva;Orthosis Ambulation/Gait Assistance: 1: +2 Total assist (R ankle air cast, and Ace wrap for ankle DF) Locomotion: Ambulation: 0: Activity did not occur (NO second helper avaialble)  Comprehension Comprehension Mode: Auditory Comprehension: 6-Follows complex conversation/direction: With extra time/assistive device  Expression Expression Mode: Verbal Expression: 5-Expresses complex 90% of the time/cues < 10% of the time  Social Interaction Social Interaction: 6-Interacts appropriately with others with medication or  extra time (anti-anxiety, antidepressant).  Problem Solving Problem Solving: 6-Solves complex problems: With extra time  Memory Memory: 5-Recognizes or recalls 90% of the time/requires cueing < 10% of the time   Medical Problem List and Plan:  1. 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 DVT Prophylaxis/Anticoagulation: Subcutaneous lovenox. Monitor platelet counts and any signs of bleeding  3. Left neck mandible mass benign.  4. 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 Plan outpatient followup for revascularization with intervention radiology  5. Diabetes mellitus. Hemoglobin A1c of 10.2. Lantus insulin 15 units at bedtime. Check blood sugars a.c. and at bedtime. Patient on glucophage 1000 mg twice daily prior to admission resume 6. Hypertension. Lisinopril 40 mg daily. Low systolic may be zanaflex related will reduce ACE I            7. Tobacco abuse. Nicoderm patch.  8. Restless leg syndrome.this is complicated by spasticity, will increase zanaflex 9. Depression.celexa. Provide emotional support and positive reinforcement  10. Hyperlipidemia. Lipitor 11.   Urinary freq  UMN bladder trial ditropan improved P   LOS (Days) 13 A FACE TO FACE EVALUATION WAS PERFORMED  Chelsey Gallagher 05/27/2011, 7:26 AM

## 2011-05-27 NOTE — Progress Notes (Signed)
Physical Therapy Note  Patient Details  Name: Chelsey Gallagher MRN: [id number] Date of Birth: 08/22/1950 Today's Date: 05/27/2011  13:00-13:30 individual therapy pt denied pain.  Performed sidesteps  To rt with squatting to strengthen rt hip muscles with mod assist to block rt. Knee into neutral to prevent hip internal rotation. vc needed to wait on therapist to get in position before stepping. Gait performed x 8' with rw and walker splint with mod assist to go straight and max assist to turn. vc needed to tighten rt. Knee in stance phase. Rt knee gave out x 1 significantly and required total assist to recover balance.  Julian Reil 05/27/2011, 4:25 PM

## 2011-05-27 NOTE — Progress Notes (Signed)
Patient reports having a better day than yesterday. No complaints of being too hot or cold this shift. participating in therapies well. BP low this am and asymptomatic, notified MD and okay to give lisinopril per MD. Otherwise no complaints today. No unsafe behavior noted. Continue plan of care.

## 2011-05-27 NOTE — Progress Notes (Signed)
Speech Language Pathology Daily Session Note  Patient Details  Name: Chelsey Gallagher MRN: [id number] Date of Birth: [birth year]  Today's Date: 05/27/2011 Time: 0930-1000 Time Calculation (min): 30 min  Short Term Goals: Week 1: SLP Short Term Goal 1 (Week 1): Patient will consume Dys. 3 thin liquid diet without observed clinical signs of aspiration with: Supervision/safety cues for use of strategies SLP Short Term Goal 1 - Progress (Week 1): Met SLP Short Term Goal 2 (Week 1): Patient will demonstrae timely mastication of regular textures with minimal assist cues SLP Short Term Goal 2 - Progress (Week 1): Met SLP Short Term Goal 3 (Week 1): Patient will demonstrate slow, over articulated speech at the conversational level with minimal assist semantic cues to utilize compensatory strategies SLP Short Term Goal 3 - Progress (Week 1): Met SLP Short Term Goal 4 (Week 1): Patient will identify effective thechiques to facilitate her speech intelligibility with moderate assist semantic cues SLP Short Term Goal 4 - Progress (Week 4): Met  Skilled Therapeutic Interventions: Focus of session included speech intelligibility and swallow safety.  Pt. Consumed thin coffee (via cup) throughout session without evidence of aspiration without cues needed for precautions.  Pt. Is an avid reader and majority of conversation included pt. explanation/plot of current book she is reading with 100% intelligibility.  Pt. 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  SLP reiterated challenge of fricatives post stroke and encouraged her to focus on placement of articulators during /s/ sounds.      Daily Session Precautions/Restrictions  Restrictions Weight Bearing Restrictions: No FIM:  Comprehension Comprehension: 6-Follows complex conversation/direction: With extra time/assistive  device Expression Expression: 5-Expresses complex 90% of the time/cues < 10% of the time Social Interaction Social Interaction: 6-Interacts appropriately with others with medication or extra time (anti-anxiety, antidepressant). Problem Solving Problem Solving: 6-Solves complex problems: With extra time Memory Memory: 6-Assistive device: No helper FIM - Eating Eating Activity: 5: Supervision/cues General    Pain Pain Assessment Pain Assessment: No/denies pain Pain Score: 0-No pain Cognition: Orientation Level: Oriented X4 Oral/Motor: Oral Motor/Sensory Function Overall Oral Motor/Sensory Function: Impaired Labial ROM: Within Functional Limits Labial Symmetry: Within Functional Limits Labial Strength: Reduced (occassional right anterior loss of bolus) Labial Sensation: Within Functional Limits Lingual ROM: Within Functional Limits Lingual Symmetry: Within Functional Limits Lingual Strength: Within Functional Limits Lingual Sensation: Within Functional Limits Facial ROM: Within Functional Limits Facial Symmetry: Within Functional Limits Facial Strength: Within Functional Limits Facial Sensation: Reduced Velum DO NOT USE: Impaired right Mandible: Within Functional Limits Motor Speech Overall Motor Speech: Impaired Respiration: Within functional limits Phonation: Normal Resonance: Hyponasality Articulation: Impaired (supervision to self monitor in distracting environment) Level of Impairment: Conversation Intelligibility: Intelligibility reduced (supervision ) Conversation: 75-100% accurate Motor Planning: Witnin functional limits Motor Speech Errors: Not applicable Interfering Components: Hearing loss (premorbid articulation errors /s/ and vocalic /r/) Effective Techniques: Over-articulate Comprehension: Auditory Comprehension Overall Auditory Comprehension: Appears within functional limits for tasks assessed Visual Recognition/Discrimination Discrimination: Within  Function Limits Reading Comprehension Reading Status: Within funtional limits Expression: Expression Primary Mode of Expression: Verbal Verbal Expression Overall Verbal Expression: Appears within functional limits for tasks assessed Written Expression Dominant Hand: Right Written Expression: Not tested  Therapy/Group: Individual Therapy  Royce Macadamia 05/27/2011, 10:20 AM

## 2011-05-28 LAB — GLUCOSE, CAPILLARY
Glucose-Capillary: 122 mg/dL — ABNORMAL HIGH (ref 70–99)
Glucose-Capillary: 117 mg/dL — ABNORMAL HIGH (ref 70–99)
Glucose-Capillary: 90 mg/dL (ref 70–99)

## 2011-05-28 MED ORDER — TIZANIDINE HCL 4 MG PO TABS
4.0000 mg | ORAL_TABLET | Freq: Three times a day (TID) | ORAL | Status: DC
  Administered 2011-05-28 – 2011-05-29 (×3): 4 mg via ORAL
  Filled 2011-05-28 (×6): qty 1

## 2011-05-28 NOTE — Progress Notes (Signed)
Met with pt to report on team conference. Pt voiced concerns that she is progressing too slowly. Provided emotional support and encouraged her to look at the progress she has made. Discussed discharge plans. She is planning for her ex-husband,Allen to stay with her at her home and a friend will help at times. Explained that both caregivers will need to come for education from therapists and nursing. SW to f/u with [name] to request him to spend the day on Monday in preparation for d/c Wednesday.

## 2011-05-28 NOTE — Progress Notes (Signed)
Physical Therapy Weekly Progress and Session Note  Patient Details  Name: Chelsey Gallagher MRN: [id number] Date of Birth: 12-11-1950  Today's Date: 05/28/2011 Time: 1300-1345 and 1300-1345 Time Calculation (min): 45 min and 45 min    Short Term Goals: Week 2:  PT Short Term Goal 1 (Week 2): Patient will perform transfers w/c <> bed with consistent min A to L and R side PT Short Term Goal 1 - Progress (Week 2): Met PT Short Term Goal 2 (Week 2): Patient will perform w/c mobility in home and controlled environment with supervision. PT Short Term Goal 2 - Progress (Week 2): Met PT Short Term Goal 3 (Week 2): Patient will perform w/c <> car transfer with min A PT Short Term Goal 3 - Progress (Week 2): Progressing toward goal PT Short Term Goal 4 (Week 2): Patient will perform gait training with appropriate orthosis and LRAD with mod A x 50 feet. PT Short Term Goal 4 - Progress (Week 2): Met  See navigator for week 3 STGs.  Skilled Therapeutic Interventions/Progress Updates: AM treatment included neuromuscular re-education, functional mobility, therapeutic exercise.  W/c mobility x 160' x 2 for general strengthening, with distant S in one arm drive w/c.  Passive and active stretching R hamstrings and heel cord before wt bearing to decrease spasticity and increase safety.   neuromuscular re-education LLE and LUE via manual and verbal cues, visual feedback, demo to facilitate head righting with wt shifting and L trunk responses, in sitting, feet unsupported.  Sit> stand with min a principally to increase wb'ing and alignment of RLE due to hip weakness.  Gait training with RW with R hand orthosis, without R ankle brace or Ace wrap x 40' x 2, focusing on R hip and knee stance control, wider base of support, = step lengths and safe use of RW.  Ascended 5 steps with one rail with min A; descended with mod  due to R knee instability.   No R ankle instability noted when not wearing ankle brace today.     Pm TREATment included neuromuscular re-education, transfer and bed mobility, w/c mobility, gait, car transfer.  Car transfer using RW with mod A x 1, min A x 1.  W/c mobility x 170' x 2 as above.  Passive and active RLE stretching in standing, with difficulty due to balance.  Gait training with RW and R hand orhthosis, min assist x 67' as above.  Standing therapeutic activity using bil UEs during folding towels, with min-mod a for balance.  Will call Chelsey Gallagher, [place] regarding R ankle bracing.    Weekly Progress Update: Pt continues to be limited by R LE strength and muscle grading difficulties, delayed balance reactions, RLE spasticity, deficits in functional mobility for bed, transfers, w/c mobilty, gait.  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  She is very motivated and participates well in therapies.  Chelsey Gallagher, ATR evaluated pt for specialty one arm drive w/c, which she requires due to her problems transferring out of a hemi ht chair which is low enough for her to propel using hemi technique with LUE and LLE.  Pt may benefit from a RLE ankle-foot orthosis; eval pending.         Therapy Documentation Precautions:  Precautions Precautions: Fall Required Braces or Orthoses: Yes Other Brace/Splint: for gait: air cast R ankle Restrictions Weight Bearing Restrictions: No Other Position/Activity Restrictions: right hemiplegia, decreased right side sensation, Dys. 3 with  thin liquids    Pain:   in AM or PM       See FIM for current functional status  Therapy/Group: Individual Therapy  Chelsey Gallagher 05/28/2011, 3:26 PM

## 2011-05-28 NOTE — Progress Notes (Signed)
Social Work Patient ID: Chelsey Gallagher, female   DOB: 07/10/1950, 61 y.o.   MRN: [id number] Met with pt to discuss discharge needs.  Need to schedule family education, try to schedule Monday for ex-husband and friend to come in most of the day and then come back in Tuesday If necessary.  Pt reports she is not sure she is ready to go home.  She wants to be doing better before She goes.  The splint the PT is getting her may help more with her ambulation.  Discussed the difficulty of Asking for assistance and relying upon others.  She voiced: " I never thought I would need to do this." Allowed pt to ventilate and express her feelings.  Will continue to provide support and offered a peer counselor. Pt will think about, see tomorrow.

## 2011-05-28 NOTE — Progress Notes (Signed)
Speech Language Pathology Daily Session Note  Patient Details  Name: Chelsey Gallagher MRN: [id number] Date of Birth: March 24, 1950  Today's Date: 05/28/2011 Time: 4098-1191 Time Calculation (min): 30 min  Short Term Goals: Week 2: SLP Short Term Goal 1 (Week 2): Patient will consume regular textures and thin liquids via straw with modified independence and no overt s/s of asipraiton  SLP Short Term Goal 2 (Week 2): Patient will consume trials of medication with thin liquids with no overt s/s of aspiraiton and supervision semantic cues SLP Short Term Goal 3 (Week 2): Patient will utilize slow over articulated speech at the conversational level with 98% aintelligibility with modified independenceI SLP Short Term Goal 4 (Week 2): Patient will self correct speech errors as needed with modified independence during conversation  Skilled Therapeutic Interventions: Session focused on trials of mixed consistencies with no observed s/s of aspiration with cereal and Mod I use of multiple swallows with medications administered whole with thin liquid sips.  Patient required reminder to place hearing aids and after was Mod I with speech intelligibility at the conversational level.  Educated patient on what SLP suspects her baseline is and how she has returned to baseline function with speech intelligibility.    Daily Session Precautions/Restrictions    FIM:  Comprehension Comprehension Mode: Auditory Comprehension: 6-Follows complex conversation/direction: With extra time/assistive device Expression Expression Mode: Verbal Expression: 6-Expresses complex ideas: With extra time/assistive device Social Interaction Social Interaction: 6-Interacts appropriately with others with medication or extra time (anti-anxiety, antidepressant). Problem Solving Problem Solving: 6-Solves complex problems: With extra time Memory Memory: 6-More than reasonable amt of time FIM - Eating Eating Activity: 6: More than  reasonable amount of time General    Pain Pain Assessment Pain Assessment: No/denies pain Pain Score: 0-No pain  Therapy/Group: Individual Therapy  [name], CCC-SLP [phone]  [name] 05/28/2011, 9:42 AM

## 2011-05-28 NOTE — Progress Notes (Signed)
Occupational Therapy Session Note  Patient Details  Name: Chelsey Gallagher MRN: [id number] Date of Birth: [birth year]  Today's Date: 05/28/2011 Time: 7564-3329 Time Calculation (min): 55 min  Short Term Goals: Week 2:  OT Short Term Goal 1 (Week 2): Pt will complete toilet transfer with min assist OT Short Term Goal 2 (Week 2): Pt will complete toileting with mod assist (2 of 3 steps) OT Short Term Goal 3 (Week 2): Pt will complete LB dressing with min assist OT Short Term Goal 4 (Week 2): Pt will complete grooming with min assist in standing at sink OT Short Term Goal 5 (Week 2): Pt will complete tub/shower transfer with min assist  Skilled Therapeutic Interventions/Progress Updates:    Pt seen for ADL retraining at sink level with focus on sit to stand with support at Rt knee to decrease internal rotation secondary to weak hip and increased tone.  Pt with increased unsupported standing balance for approx 10 seconds and standing with LUE support on sink for 1-2 mins without LOB/knee buckling.  Pt with increased stability and ability to pull up underwear and pants with only steady assist from this therapist.  Plan to continue to increase time in standing.  Therapy Documentation Precautions:  Precautions Precautions: Fall Required Braces or Orthoses: Yes Other Brace/Splint: for gait: air cast R ankle Restrictions Weight Bearing Restrictions: No Other Position/Activity Restrictions: right hemiplegia, decreased right side sensation, Dys. 3 with thin liquids Pain: Pain Assessment Pain Assessment: No/denies pain Pain Score: 0-No pain  See FIM for current functional status  Therapy/Group: Individual Therapy  [name] 05/28/2011, 10:38 AM

## 2011-05-28 NOTE — Progress Notes (Signed)
Patient ID: Chelsey Gallagher, female   DOB: 1950/06/22, 61 y.o.   MRN: [id number]   Subjective/Complaints: Headache last noc no BMs since Sunday no nausea, still has leg spasms at night Review of Systems  Genitourinary: Positive for urgency and frequency. Negative for dysuria.  Neurological: Positive for focal weakness.  Psychiatric/Behavioral: The patient has insomnia.   All other systems reviewed and are negative.     Objective: Vital Signs: Blood pressure 103/63, pulse 81, temperature 97.4 F (36.3 C), temperature source Oral, resp. rate 18, height 5\' 3"  (1.6 m), weight 64.7 kg (142 lb 10.2 oz), SpO2 95.00%. No results found. Results for orders placed during the hospital encounter of 05/14/11 (from the past 72 hour(s))  GLUCOSE, CAPILLARY     Status: Abnormal   Collection Time   05/25/11  7:40 AM      Component Value Range Comment   Glucose-Capillary 149 (*) 70 - 99 (mg/dL)   CLOSTRIDIUM DIFFICILE BY PCR     Status: Normal   Collection Time   05/25/11  9:35 AM      Component Value Range Comment   C difficile by pcr NEGATIVE  NEGATIVE    GLUCOSE, CAPILLARY     Status: Abnormal   Collection Time   05/25/11 11:49 AM      Component Value Range Comment   Glucose-Capillary 171 (*) 70 - 99 (mg/dL)   GLUCOSE, CAPILLARY     Status: Abnormal   Collection Time   05/25/11  4:50 PM      Component Value Range Comment   Glucose-Capillary 162 (*) 70 - 99 (mg/dL)    Comment 1 Notify RN     GLUCOSE, CAPILLARY     Status: Abnormal   Collection Time   05/25/11  9:26 PM      Component Value Range Comment   Glucose-Capillary 129 (*) 70 - 99 (mg/dL)   GLUCOSE, CAPILLARY     Status: Abnormal   Collection Time   05/26/11  7:19 AM      Component Value Range Comment   Glucose-Capillary 146 (*) 70 - 99 (mg/dL)    Comment 1 Notify RN     GLUCOSE, CAPILLARY     Status: Abnormal   Collection Time   05/26/11 11:18 AM      Component Value Range Comment   Glucose-Capillary 142 (*) 70 - 99 (mg/dL)    Comment 1 Notify RN     GLUCOSE, CAPILLARY     Status: Abnormal   Collection Time   05/26/11  4:23 PM      Component Value Range Comment   Glucose-Capillary 176 (*) 70 - 99 (mg/dL)    Comment 1 Notify RN     GLUCOSE, CAPILLARY     Status: Normal   Collection Time   05/26/11  9:19 PM      Component Value Range Comment   Glucose-Capillary 87  70 - 99 (mg/dL)    Comment 1 Notify RN     GLUCOSE, CAPILLARY     Status: Abnormal   Collection Time   05/27/11  7:51 AM      Component Value Range Comment   Glucose-Capillary 134 (*) 70 - 99 (mg/dL)   GLUCOSE, CAPILLARY     Status: Abnormal   Collection Time   05/27/11 11:57 AM      Component Value Range Comment   Glucose-Capillary 125 (*) 70 - 99 (mg/dL)    Comment 1 Notify RN     GLUCOSE, CAPILLARY  Status: Abnormal   Collection Time   05/27/11  4:44 PM      Component Value Range Comment   Glucose-Capillary 155 (*) 70 - 99 (mg/dL)    Comment 1 Notify RN     GLUCOSE, CAPILLARY     Status: Abnormal   Collection Time   05/27/11  9:18 PM      Component Value Range Comment   Glucose-Capillary 114 (*) 70 - 99 (mg/dL)    Comment 1 Notify RN      hysical Exam  Vitals reviewed.  Constitutional: She is oriented to person, place, and time. She appears well-developed.  HENT: Throat clr no thrush Head: Normocephalic. PERRL, EOMI  Neck: Normal range of motion. Neck supple. No thyromegaly present.  Cardiovascular: Normal rate and regular rhythm.  Pulmonary/Chest: Effort normal and breath sounds normal. She has no wheezes.  Abdominal: Soft. She exhibits no distension. There is no tenderness.  Musculoskeletal: She exhibits no edema.  Neurological: She is alert and oriented to person, place, and time. Increased tone right She is somewhat hard of hearing (Right more affected than left). She wears hearing aids on either ear. Shows good awareness of deficits. Follow three-step commands.Severely Dysarthric speech. Gaze intact. Poor visual acuity overall.  Right facial weakness and lower facial sensory loss with tongue deviation.  RUE 0'/5. RLE 2+ prox to 0/trace distally with ADF and 1+/5 APF. Left side motor and sensory nromal. . She can sense pain however.  Skin: Skin is warm and dry.  Psychiatric: She has a normal mood and affect.  Mild anxiety  Assessment/Plan: 1. 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 Physiatrist is providing close team supervision and 24 hour management of active medical problems listed below. Physiatrist and rehab team continue to assess barriers to discharge/monitor patient progress toward functional and medical goals. FIM: FIM - Bathing Bathing Steps Patient Completed: Chest;Right Arm;Abdomen;Front perineal area;Right upper leg;Left upper leg;Right lower leg (including foot);Left lower leg (including foot) Bathing: 4: Min-Patient completes 8-9 79f 10 parts or 75+ percent  FIM - Upper Body Dressing/Undressing Upper body dressing/undressing steps patient completed: Thread/unthread right sleeve of pullover shirt/dresss;Thread/unthread left sleeve of pullover shirt/dress;Put head through opening of pull over shirt/dress;Pull shirt over trunk Upper body dressing/undressing: 5: Supervision: Safety issues/verbal cues FIM - Lower Body Dressing/Undressing Lower body dressing/undressing steps patient completed: Thread/unthread right underwear leg;Thread/unthread left underwear leg;Pull underwear up/down;Thread/unthread right pants leg;Thread/unthread left pants leg;Don/Doff right sock;Don/Doff left sock;Don/Doff left shoe Lower body dressing/undressing: 3: Mod-Patient completed 50-74% of tasks  FIM - Toileting Toileting steps completed by patient: Performs perineal hygiene Toileting Assistive Devices: Grab bar or rail for support Toileting: 2: Max-Patient completed 1 of 3 steps  FIM - Ambulance person Devices: Grab bars Toilet Transfers: 3-To toilet/BSC: Mod A (lift or lower assist);3-From toilet/BSC: Mod A (lift or lower assist)  FIM - Press photographer Assistive Devices: HOB elevated Bed/Chair Transfer: 3: Chair or W/C > Bed: Mod A (lift or lower assist)  FIM - Locomotion: Wheelchair Distance: 200 Locomotion: Wheelchair: 5: Travels 150 ft or more: maneuvers on rugs and over door sills with supervision, cueing or coaxing FIM - Locomotion: Ambulation Locomotion: Ambulation Assistive Devices: Walker - Eva;Orthosis Ambulation/Gait Assistance: 1: +2 Total assist (R ankle air cast, and Ace wrap for ankle DF) Locomotion: Ambulation: 0: Activity did not occur (NO second helper avaialble)  Comprehension Comprehension Mode:  Auditory Comprehension: 6-Follows complex conversation/direction: With extra time/assistive device  Expression Expression Mode: Verbal Expression: 5-Expresses complex 90% of the time/cues < 10% of the time  Social Interaction Social Interaction: 6-Interacts appropriately with others with medication or extra time (anti-anxiety, antidepressant).  Problem Solving Problem Solving: 6-Solves complex problems: With extra time  Memory Memory: 6-More than reasonable amt of time   Medical Problem List and Plan:  1. 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 DVT Prophylaxis/Anticoagulation: Subcutaneous lovenox. Monitor platelet counts and any signs of bleeding  3. Left neck mandible mass benign.  4. 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 Plan outpatient followup for revascularization with intervention radiology  5. Diabetes mellitus. Hemoglobin A1c of 10.2. Lantus insulin 15 units at bedtime. Check blood sugars a.c. and at bedtime. Patient on glucophage 1000 mg twice daily prior to admission resume 6. Hypertension. Lisinopril 40 mg daily. Low systolic may be zanaflex related will reduce ACE I             7. Tobacco abuse. Nicoderm patch.  8. Restless leg syndrome.this is complicated by spasticity, will increase zanaflex 9. Depression.celexa. Provide emotional support and positive reinforcement  10. Hyperlipidemia. Lipitor 11.  Urinary freq  UMN bladder trial ditropan improved P   LOS (Days) 14 A FACE TO FACE EVALUATION WAS PERFORMED  Nazanin Kinner E 05/28/2011, 7:31 AM

## 2011-05-28 NOTE — Patient Care Conference (Signed)
Inpatient RehabilitationTeam Conference Note Date: 05/28/2011   Time: 11:00 AM    Patient Name: Chelsey Gallagher      Medical Record Number: [id number]  Date of Birth: 1950/06/11 Sex: Female         Room/Bed: 4031/4031-01 Payor Info: Payor: [employer] [place] [employer]ARE  Plan: BLUE MEDICARE  Product Type: *No Product type*     Admitting Diagnosis: l CVA  Admit Date/Time:  05/14/2011  3:42 PM Admission Comments: No comment available   Primary Diagnosis:  Stroke Principal Problem: Stroke  Patient Active Problem List  Diagnoses Date Noted  . Stroke 05/15/2011  . Hyperlipidemia 05/14/2011  . Tobacco abuse 05/14/2011  . 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 Lung abnormality 05/11/2011  . CVA (cerebral infarction) 05/08/2011  . Hypertension   . Diabetes mellitus     Expected Discharge Date: Expected Discharge Date: 06/04/11  Team Members Present: Physician: Dr. Claudette Laws Case Manager Present: Lutricia Horsfall, RN Social Worker Present: Dossie Der, LCSW Nurse Present: Gregor Hams, RN PT Present: Edman Circle, Lillie Columbia, PT OT Present: [name], Felipa Eth, OT SLP Present: Fae Pippin, SLP PPS Coordinator: Tora Duck, RN    Current Status/Progress Goal Weekly Team Focus  Medical   Gastroenteritis symptoms improved, increasing tone in the right lower extremity  Improve nocturnal leg spasms  Adjust medications   Bowel/Bladder   Incont bowel 3/24-2/25; Contient of bladder  continent bowel and bladder      Swallow/Nutrition/ Hydration   regular, thin with intermittnet supervision   least restrictive p.o. intake  trials of medication whole with liquid   ADL's   min assist bathing, min assist UB and LB dressing, min assist stand pivot transfers, mod assist toileting  supervision overall, min assist transfers  transfers, RUE NM re-ed, standing balance   Mobility   min A basic and car transfers; distant supervision one-arm drive w/c x 119'  controlled env; specialty w/c eval completed.; mod A gait x 64' with RW  supervision basic transfers; supervision w/c mobility 150' controlled env. and 50' home environment; upgraded gait LTG to min A 100' controlled environment with LRAD, and min A 4 steps   spasticity management; strengthening, functional mobility, pt and family ed, AFO decision; DME   Communication   Mod I   Mod I   education    Safety/Cognition/ Behavioral Observations  no unsafe behaviors noted  remain safe during hopspital stay      Pain   no complaints of pain, </= 3  no complains of pain      Skin                *See Interdisciplinary Assessment and Plan and progress notes for long and short-term goals  Barriers to Discharge: Slow neurologic recovery    Possible Resolutions to Barriers:  Continue inpatient rehabilitation    Discharge Planning/Teaching Needs:  Home with ex-husband who can provide 24 hour assist.  He has been here to observe in therapies.      Team Discussion:  Pt motivated, trying hard. Had GI illness over weekend. Continues with RLE spasms and RLS; adjusting meds. Mod-max A toileting. 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 Plan to get smaller AFO. Discussed d/c plan.  Revisions to Treatment Plan:  none   Continued Need for Acute Rehabilitation Level of Care: The patient requires daily medical management by a physician with specialized training in physical medicine and rehabilitation for the  following conditions: Daily direction of a multidisciplinary physical rehabilitation program to ensure safe treatment while eliciting the highest outcome that is of practical value to the patient.: Yes Daily medical management of patient stability for increased activity during participation in an intensive rehabilitation regime.: Yes Daily analysis of laboratory values and/or radiology reports with any subsequent need for medication adjustment of medical  intervention for : Neurological problems  Meryl Dare 05/28/2011, 2:21 PM

## 2011-05-29 DIAGNOSIS — I6330 Cerebral infarction due to thrombosis of unspecified cerebral artery: Secondary | ICD-10-CM

## 2011-05-29 DIAGNOSIS — G8110 Spastic hemiplegia affecting unspecified side: Secondary | ICD-10-CM

## 2011-05-29 DIAGNOSIS — Z5189 Encounter for other specified aftercare: Secondary | ICD-10-CM

## 2011-05-29 LAB — GLUCOSE, CAPILLARY
Glucose-Capillary: 159 mg/dL — ABNORMAL HIGH (ref 70–99)
Glucose-Capillary: 90 mg/dL (ref 70–99)

## 2011-05-29 MED ORDER — TIZANIDINE HCL 4 MG PO TABS
4.0000 mg | ORAL_TABLET | Freq: Every day | ORAL | Status: DC
  Administered 2011-05-29 – 2011-06-03 (×6): 4 mg via ORAL
  Filled 2011-05-29 (×7): qty 1

## 2011-05-29 NOTE — Progress Notes (Signed)
Physical Therapy Session Note  Patient Details  Name: Chelsey Gallagher MRN: [id number] Date of Birth: [birth year]  Today's Date: 05/29/2011 Time: 7829-5621 and 3086-5784 Time Calculation (min): 60 min and 45 min  Short Term Goals: Week 3:  PT Short Term Goal 1 (Week 3): Pt will perform all bed mobilty modifiied independence. PT Short Term Goal 2 (Week 3): Pt will perform car transfer with min assist. PT Short Term Goal 3 (Week 3): Pt will perform w/c mobility in home environment x 50' with supervision. PT Short Term Goal 4 (Week 3): Pt will perform gait x 100' in controlled environment with min asistance. PT Short Term Goal 5 (Week 3): Pt will ascend and descend 4 steps with one rail with min assistance.  Therapy Documentation Patient extremely lethargic this am; c/o diplopia, nausea, 2 episodes of diarrhea last night, and poor ability to focus.  RN and PA notified; vitals assessed; low BP; PA feels these are side effects of an increase in medication and will adjust accordingly.  Will monitor patient's tone and may resume use of night splint if a loss of ankle DF and eversion ROM is noted. Precautions:  Precautions Precautions: Fall Required Braces or Orthoses: Yes Other Brace/Splint: for gait: air cast R ankle Restrictions Weight Bearing Restrictions: No Other Position/Activity Restrictions: right hemiplegia, decreased right side sensation, Dys. 3 with thin liquids Vital Signs: Therapy Vitals Temp: 97.5 F (36.4 C) Temp src: Oral Pulse Rate: 72  BP: 95/50 mmHg Patient Position, if appropriate: Sitting Oxygen Therapy SpO2: 95 % O2 Device: None (Room air) Pain: Pain Assessment Pain Assessment: 0-10 Pain Score: 0-No pain Pain Type: Acute pain Pain Location: Shoulder Pain Orientation: Left Pain Descriptors: Sore Pain Intervention(s): RN made aware Locomotion : Ambulation: P&O present to assess patient's gait for custom molded AFO Ambulation/Gait Assistance: 2: Performed  gait with RW and Max assist x 10' to allow P&O to observe impaired gait sequence; still presents with R foot drop, decreased hip and knee flexion in swing and hip and knee instability in stance; during gait assessment patient began to c/o significant dizziness and began to fall; patient placed in w/c quickly by therapist and P&O; dizziness resolved.  P&O able to cast patient's RLE for thermoplastic solid ankle AFO for improved safety and stability during transfers and gait.   Wheelchair Mobility Distance: 150 total A today secondary to lethargy and fatigue  Other Treatments: Treatments Therapeutic Activity: Had patient verbalize and demonstrate how to safely set up w/c for transfer w/c > bed; patient required max verbal and visual cues for sequence for removing w/c hardware, securing brakes and preparing herself for w/c <> bed transfer secondary to lethargy and impaired ability to focus on task.  Transfers bed/mat <> w/c with stand pivot with mod A and max verbal and tactile cues for sit <> stand sequence.  Discussed with patient how her bed is set up at home; had patient demonstrate bed mobility and rolling on flat mat; required min A to bring RLE up onto bed, mod A for full roll to L and R side and max A for side to sit from R side.  Following gait assessment and AFO assessment patient placed in her hospital bed in supine for rest and to minimize dizziness.  Will address further this pm.  PM session: patient reports that diplopia and dizziness have resolved but still feels fatigued; patient more alert.  Patient able to perform w/c mobility on unit x 150 with min-mod verbal cues for UE  one arm drive sequencing.  Reviewed with patient through visual demonstration how to set w/c up for w/c > bed at home transfer, w/c parts management sequence, squat pivot sequence and full bed mobility sequence.  Patient gave repeat demonstration of squat pivots to L and R with mod A for R knee control and verbal cues for hand  placement and sequence; patient performed sit to supine and rolling in bed to L and R with supervision; continued to focus on side to sit from R side with focus on minimizing initiation with head and focus on R trunk flexion and L pelvic depression; still requiring mod-max A to perform.  Will continue to address.    See FIM for current functional status  Therapy/Group: Individual Therapy  Edman Circle [facility] 05/29/2011, 12:27 PM

## 2011-05-29 NOTE — Progress Notes (Signed)
 Patient ID: Chelsey Gallagher, female   DOB: August 15, 1950, 61 y.o.   MRN: [id number]   Subjective/Complaints: RLE spasms throughout the day but better today Review of Systems  Genitourinary: Positive for urgency and frequency. Negative for dysuria.  Neurological: Positive for focal weakness.  Psychiatric/Behavioral: The patient has insomnia.   All other systems reviewed and are negative.    Objective: Vital Signs: Blood pressure 112/64, pulse 85, temperature 98.2 F (36.8 C), temperature source Oral, resp. rate 16, height 5\' 3"  (1.6 m), weight 64.7 kg (142 lb 10.2 oz), SpO2 94.00%. No results found. Results for orders placed during the hospital encounter of 05/14/11 (from the past 72 hour(s))  GLUCOSE, CAPILLARY     Status: Abnormal   Collection Time   05/26/11 11:18 AM      Component Value Range Comment   Glucose-Capillary 142 (*) 70 - 99 (mg/dL)    Comment 1 Notify RN     GLUCOSE, CAPILLARY     Status: Abnormal   Collection Time   05/26/11  4:23 PM      Component Value Range Comment   Glucose-Capillary 176 (*) 70 - 99 (mg/dL)    Comment 1 Notify RN     GLUCOSE, CAPILLARY     Status: Normal   Collection Time   05/26/11  9:19 PM      Component Value Range Comment   Glucose-Capillary 87  70 - 99 (mg/dL)    Comment 1 Notify RN     GLUCOSE, CAPILLARY     Status: Abnormal   Collection Time   05/27/11  7:51 AM      Component Value Range Comment   Glucose-Capillary 134 (*) 70 - 99 (mg/dL)   GLUCOSE, CAPILLARY     Status: Abnormal   Collection Time   05/27/11 11:57 AM      Component Value Range Comment   Glucose-Capillary 125 (*) 70 - 99 (mg/dL)    Comment 1 Notify RN     GLUCOSE, CAPILLARY     Status: Abnormal   Collection Time   05/27/11  4:44 PM      Component Value Range Comment   Glucose-Capillary 155 (*) 70 - 99 (mg/dL)    Comment 1 Notify RN     GLUCOSE, CAPILLARY     Status: Abnormal   Collection Time   05/27/11  9:18 PM      Component Value Range Comment   Glucose-Capillary 114 (*) 70 - 99 (mg/dL)    Comment 1 Notify RN     GLUCOSE, CAPILLARY     Status: Abnormal   Collection Time   05/28/11  7:11 AM      Component Value Range Comment   Glucose-Capillary 122 (*) 70 - 99 (mg/dL)    Comment 1 Notify RN     GLUCOSE, CAPILLARY     Status: Abnormal   Collection Time   05/28/11 11:06 AM      Component Value Range Comment   Glucose-Capillary 117 (*) 70 - 99 (mg/dL)    Comment 1 Notify RN     GLUCOSE, CAPILLARY     Status: Abnormal   Collection Time   05/28/11  4:10 PM      Component Value Range Comment   Glucose-Capillary 143 (*) 70 - 99 (mg/dL)    Comment 1 Notify RN     GLUCOSE, CAPILLARY     Status: Normal   Collection Time   05/28/11  9:10 PM      Component Value Range  Comment   Glucose-Capillary 90  70 - 99 (mg/dL)    Comment 1 Notify RN     GLUCOSE, CAPILLARY     Status: Abnormal   Collection Time   05/29/11  7:38 AM      Component Value Range Comment   Glucose-Capillary 116 (*) 70 - 99 (mg/dL)    Comment 1 Notify RN      hysical Exam  Vitals reviewed.  Constitutional: She is oriented to person, place, and time. She appears well-developed.  HENT: Throat clr no thrush Head: Normocephalic. PERRL, EOMI  Neck: Normal range of motion. Neck supple. No thyromegaly present.  Cardiovascular: Normal rate and regular rhythm.  Pulmonary/Chest: Effort normal and breath sounds normal. She has no wheezes.  Abdominal: Soft. She exhibits no distension. There is no tenderness.  Musculoskeletal: She exhibits no edema.  Neurological: She is alert and oriented to person, place, and time. Increased tone right She is somewhat hard of hearing (Right more affected than left). She wears hearing aids on either ear. Shows good awareness of deficits. Follow three-step commands.Severely Dysarthric speech. Gaze intact. Poor visual acuity overall. Right facial weakness and lower facial sensory loss with tongue deviation.  RUE 0'/5. RLE 2+ prox to 0/trace distally  with ADF and 1+/5 APF. Left side motor and sensory nromal. . She can sense pain however.  Skin: Skin is warm and dry.  Psychiatric: She has a normal mood and affect.  Mild anxiety  Assessment/Plan: 1. 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 Physiatrist is providing close team supervision and 24 hour management of active medical problems listed below. Physiatrist and rehab team continue to assess barriers to discharge/monitor patient progress toward functional and medical goals. FIM: FIM - Bathing Bathing Steps Patient Completed: Chest;Right Arm;Left Arm;Front perineal area;Abdomen;Right upper leg;Buttocks;Left upper leg;Right lower leg (including foot);Left lower leg (including foot) Bathing: 4: Steadying assist  FIM - Upper Body Dressing/Undressing Upper body dressing/undressing steps patient completed: Thread/unthread right sleeve of pullover shirt/dresss;Thread/unthread left sleeve of pullover shirt/dress;Put head through opening of pull over shirt/dress;Pull shirt over trunk Upper body dressing/undressing: 5: Supervision: Safety issues/verbal cues FIM - Lower Body Dressing/Undressing Lower body dressing/undressing steps patient completed: Thread/unthread right underwear leg;Thread/unthread left underwear leg;Pull underwear up/down;Thread/unthread right pants leg;Thread/unthread left pants leg;Pull pants up/down;Don/Doff right sock;Don/Doff left sock;Don/Doff left shoe Lower body dressing/undressing: 4: Min-Patient completed 75 plus % of tasks  FIM - Toileting Toileting steps completed by patient: Performs perineal hygiene Toileting Assistive Devices: Grab bar or rail for support Toileting: 2: Max-Patient completed 1 of 3 steps  FIM - Diplomatic Services operational officer Devices: Grab bars Toilet Transfers: 3-To toilet/BSC: Mod A (lift or lower  assist);3-From toilet/BSC: Mod A (lift or lower assist)  FIM - Bed/Chair Transfer Bed/Chair Transfer Assistive Devices: HOB elevated Bed/Chair Transfer: 3: Supine > Sit: Mod A (lifting assist/Pt. 50-74%/lift 2 legs;5: Sit > Supine: Supervision (verbal cues/safety issues);4: Bed > Chair or W/C: Min A (steadying Pt. > 75%);4: Chair or W/C > Bed: Min A (steadying Pt. > 75%)  FIM - Locomotion: Wheelchair Distance: 200 Locomotion: Wheelchair: 5: Travels 150 ft or more: maneuvers on rugs and over door sills with supervision, cueing or coaxing FIM - Locomotion: Ambulation Locomotion: Ambulation Assistive Devices: Walker - Rolling;Orthosis (r hand orthosis on RW) Ambulation/Gait Assistance: 1: +2 Total assist (R ankle air cast, and Ace wrap for ankle DF) Locomotion: Ambulation: 2: [employer]  50 - 149 ft with minimal assistance (Pt.>75%)  Comprehension Comprehension Mode: Auditory Comprehension: 6-Follows complex conversation/direction: With extra time/assistive device  Expression Expression Mode: Verbal Expression: 5-Expresses complex 90% of the time/cues < 10% of the time  Social Interaction Social Interaction: 6-Interacts appropriately with others with medication or extra time (anti-anxiety, antidepressant).  Problem Solving Problem Solving: 6-Solves complex problems: With extra time  Memory Memory: 6-More than reasonable amt of time   Medical Problem List and Plan:  1. 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 DVT Prophylaxis/Anticoagulation: Subcutaneous lovenox. Monitor platelet counts and any signs of bleeding  3. Left neck mandible mass benign.  4. 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 Plan outpatient followup for revascularization with intervention radiology  5. Diabetes mellitus. Hemoglobin A1c of 10.2. Lantus insulin 15 units at bedtime. Check blood sugars a.c. and at bedtime. Patient on glucophage 1000 mg twice daily prior to admission resume 6. Hypertension.  Lisinopril 40 mg daily. Low systolic may be zanaflex related will reduce ACE I            7. Tobacco abuse. Nicoderm patch.  8. Restless leg syndrome.this is complicated by spasticity, will increase zanaflex 9. Depression.celexa. Provide emotional support and positive reinforcement  10. Hyperlipidemia. Lipitor 11.  Urinary freq  UMN bladder trial ditropan improved P   LOS (Days) 15 A FACE TO FACE EVALUATION WAS PERFORMED  , E 05/29/2011, 9:06 AM

## 2011-05-29 NOTE — Progress Notes (Signed)
Social Work Patient ID: Chelsey Gallagher, female   DOB: 02-23-1951, 61 y.o.   MRN: [id number]  Met with pt who reports had a episode this am with blood pressure, but feels better now. Ex-husband and friend to come in Monday for all day of therapies and to begin teaching In anticipation for discharge 4/3.  Discussed follow up therapies and coverage.  Her coverage is Better for home health than OP Rehab.  May need to begin with home health and then transition to OP so can get the optimal coverage.  Continue to work on discharge plans.

## 2011-05-29 NOTE — Progress Notes (Signed)
Called to patient room by PT reported that the patient complained of double vision and dizziness. Manual blood pressure was 90/50. Deatra Ina PA made aware, in to see patient, Medication (Zanaflex) adjusted Will continue to monitor. [place], [place]

## 2011-05-29 NOTE — Progress Notes (Addendum)
Occupational Therapy Session Note  Patient Details  Name: Chelsey Gallagher MRN: [id number] Date of Birth: 1950-04-27  Today's Date: 05/29/2011 Time: 0830-0930 Time Calculation (min): 60 min  Short Term Goals: Week 2:  OT Short Term Goal 1 (Week 2): Pt will complete toilet transfer with min assist OT Short Term Goal 2 (Week 2): Pt will complete toileting with mod assist (2 of 3 steps) OT Short Term Goal 3 (Week 2): Pt will complete LB dressing with min assist OT Short Term Goal 4 (Week 2): Pt will complete grooming with min assist in standing at sink OT Short Term Goal 5 (Week 2): Pt will complete tub/shower transfer with min assist  Skilled Therapeutic Interventions/Progress Updates:  Self care retraining to include bed mobility, transfer bed>w/c, sponge bath (declined shower), dress and groom.  During all functional mobility and BADL tasks in this session, focused on: patient attending to and managing RUE and RLE, normal movement patterns, sit<> stands, dynamic sitting and standing balance, postural control in unsupported sitting and in standing, attention to tasks, memory, adaptive techniques, safety, RUE & RLE weight bearing in sit and in stand.  Patient able to push right brake handle with RUE after her RUE is positioned on the handle.  When patient tries to use her RUE, she over compensates with her trunk and shoulder elevation.  Precautions:  Precautions Precautions: Fall Required Braces or Orthoses: Yes Other Brace/Splint: for gait: air cast R ankle Restrictions Weight Bearing Restrictions: No Other Position/Activity Restrictions: right hemiplegia, decreased right side sensation, Dys. 3 with thin liquids Pain: No c/o pain  See FIM for current functional status  Therapy/Group: Individual Therapy  Maimuna Leaman 05/29/2011, 10:37 AM

## 2011-05-29 NOTE — Progress Notes (Signed)
Patient ID: Chelsey Gallagher, female   DOB: August 22, 1950, 61 y.o.   MRN: [id number] Update called to [facility] [employer] [Jimmie Sue Collett].  Cristal Deer agrees w/ 06/04/11 for d/c [or update w/ rationale if more time needed].

## 2011-05-29 NOTE — Progress Notes (Signed)
Speech Language Pathology Daily Session Note & Discharge  Patient Details  Name: Chelsey Gallagher MRN: [id number] Date of Birth: [birth year]  Today's Date: 05/29/2011 Time: 0805-0830 Time Calculation (min): 25 min  Short Term Goals: Week 2: SLP Short Term Goal 1 (Week 2): Patient will consume regular textures and thin liquids via straw with modified independence and no overt s/s of asipraiton  SLP Short Term Goal 2 (Week 2): Patient will consume trials of medication with thin liquids with no overt s/s of aspiraiton and supervision semantic cues SLP Short Term Goal 3 (Week 2): Patient will utilize slow over articulated speech at the conversational level with 98% aintelligibility with modified independenceI SLP Short Term Goal 4 (Week 2): Patient will self correct speech errors as needed with modified independence during conversation  Skilled Therapeutic Interventions: Session focused on speech intelligibility throughout session as well as safety with swallowing medication.  SLP began session with a phone call to patient who was Mod I with speech intelligibility at the conversational level; patient consumed mixed consistencies (medication whole with thin liquid via cup) with cough x1 due to suspected penetration.  Patient was Mod I with strategy to clear suspected penetrates with throat clears and increased wait to to utilize a hard effortful cough.  Patient then consumed regular textures and thin liquids via cup with no overt s/s of aspiration.  SLP educated patient regarding discharge today and continued use strategies as needed.    Daily Session Precautions/Restrictions    FIM:  Comprehension Comprehension Mode: Auditory Comprehension: 6-Follows complex conversation/direction: With extra time/assistive device Expression Expression Mode: Verbal Expression: 6-Expresses complex ideas: With extra time/assistive device Social Interaction Social Interaction: 6-Interacts appropriately with  others with medication or extra time (anti-anxiety, antidepressant). Problem Solving Problem Solving: 6-Solves complex problems: With extra time Memory Memory: 6-More than reasonable amt of time FIM - Eating Eating Activity: 6: More than reasonable amount of time General    Pain Pain Assessment Pain Assessment: No/denies pain Pain Score: 0-No pain  Therapy/Group: Individual Therapy  Speech Language Pathology Discharge Summary  Patient Details  Name: Chelsey Gallagher MRN: [id number] Date of Birth: 1950/06/14   Patient has met 4 of 4 long term goals due to gains in swallow function and speech intelligibility.  Patient to discharge at overall Modified Independent level and able to self advocate.  Patient's care partner is independent to provide the necessary assistance at discharge.  Reasons goals not met: n/a  Recommendation:  Patient requires no SLP follow up post CIR discharge.  Equipment: none   Reasons for discharge: treatment goals met  Patient/family agrees with progress made and goals achieved: Yes  See FIM for current functional status  [name], CCC-SLP [phone]

## 2011-05-30 ENCOUNTER — Inpatient Hospital Stay (HOSPITAL_COMMUNITY): Payer: Medicare Other

## 2011-05-30 LAB — GLUCOSE, CAPILLARY: Glucose-Capillary: 101 mg/dL — ABNORMAL HIGH (ref 70–99)

## 2011-05-30 NOTE — Progress Notes (Signed)
Per State Regulation 482.30 This chart was reviewed for medical necessity with respect to the patient's Admission/Duration of stay. Pt having hypotensive episodes, adjusting meds. Pt's spouse in for education yesterday.  Meryl Dare                 Nurse Care Manager            Next Review Date: 06/02/11

## 2011-05-30 NOTE — Progress Notes (Signed)
Physical Therapy Session Note  Patient Details  Name: Chelsey Gallagher MRN: [id number] Date of Birth: [birth year]  Today's Date: 05/30/2011 Time: 0900-1000 and 7829-5621 Time Calculation (min): 60 min and 30 min  Short Term Goals: Week 3:  PT Short Term Goal 1 (Week 3): Pt will perform all bed mobilty modifiied independence. PT Short Term Goal 2 (Week 3): Pt will perform car transfer with min assist. PT Short Term Goal 3 (Week 3): Pt will perform w/c mobility in home environment x 50' with supervision. PT Short Term Goal 4 (Week 3): Pt will perform gait x 100' in controlled environment with min asistance. PT Short Term Goal 5 (Week 3): Pt will ascend and descend 4 steps with one rail with min assistance.  Therapy Documentation Precautions:  Precautions Precautions: Fall Required Braces or Orthoses: Yes Other Brace/Splint: for gait: air cast R ankle Restrictions Weight Bearing Restrictions: No Other Position/Activity Restrictions: right hemiplegia, decreased right side sensation, Dys. 3 with thin liquids Pain: Pain Assessment Pain Assessment: No/denies pain Locomotion : Ambulation Ambulation/Gait Assistance: 3: Pre-gait RLE stepping with focus on full step length and anterior translation of COG over R stance LE with BIONESS and UE support on tall table.  Mod assist x 25' forwards and retro stepping x 2 reps with LUE on wall rail; first repetition with use of Bioness electrical stimulation for activation of ankle DF for increased R foot clearance and knee control at initial contact and mid stance with manual facilitation at trunk for upright posture and full anterior/posterior translation of COG, lateral weight shifting and manual facilitation at RLE for hip ER and ABD during swing phase.  Second repetition gait assessment with R AFO from P&O; patient reporting pain at lateral malleolus secondary to inversion/supination of foot; P%O to flare lateral side for pressure relief and add strap  across top of ankle to facilitate eversion/pronation in stance.  Hip ABD and knee control training with R lateral stepping with a squat during step together with min-mod A.  PM session: patient received revised AFO with flared lateral edges and dorsal strap for ankle/foot positioning.  Educated patient on wearing AFO during the day to maintain ankle ROM and positioning for transfers but also educated patient on removing AFO if she feels pain or increased pressure on ankle bones and how to inspect skin for pressure areas.  Gait training with AFO and UE support around therapist while ambulating with feet wide apart straddling boxes x 25' x 2 reps to facilitate hip ABD and minimize scissoring.  Gait training on unit with UE support around therapist's shoulders to facilitate upright trunk and tactile cues for trunk elongation with manual facilitation at R hip for ER/ABD in stance and anterior translation of COG and hip extension during terminal stance x 150'.      See FIM for current functional status  Therapy/Group: Individual Therapy  [facility] 05/30/2011, 10:37 AM

## 2011-05-30 NOTE — Progress Notes (Signed)
Occupational Therapy Weekly Progress Note  Patient Details  Name: Chelsey Gallagher MRN: [id number] Date of Birth: 02-05-1951  Today's Date: 05/30/2011 Time: 8295-6213 and 0865-7846 Time Calculation (min): 55 min and 44 min  Patient has met 4 of 5 short term goals.  Pt is progressing with decreased assist transfers at an overall min assist at this time requiring tactile cues at Rt knee to decrease internal rotation of hip and knee with sit to stand.  Pt with increased awareness of foot placement prior to standing.  Pt continues to require mod assist transfer to walk-in shower, may benefit from using tub/shower at home for increased independence with transfers.  Pt is integrating RUE more in functional tasks with incorporating it in locking Rt w/c brake, using BUE to bring RLE up to knee to don socks and pants.  Pt is demonstrating improved standing balance with min assist initially and close supervision/contact guard for approx 1 min at a time.    Patient continues to demonstrate the following deficits: continues to require assist with toileting (clothing management), occasional heavy assist with transfers, and dominant RUE weakness and inattention and therefore will continue to benefit from skilled OT intervention to enhance overall performance with BADL and iADL.  Patient progressing toward long term goals..  Continue plan of care.  OT Short Term Goals Week 2:  OT Short Term Goal 1 (Week 2): Pt will complete toilet transfer with min assist OT Short Term Goal 1 - Progress (Week 2): Met OT Short Term Goal 2 (Week 2): Pt will complete toileting with mod assist (2 of 3 steps) OT Short Term Goal 2 - Progress (Week 2): Met OT Short Term Goal 3 (Week 2): Pt will complete LB dressing with min assist OT Short Term Goal 3 - Progress (Week 2): Met OT Short Term Goal 4 (Week 2): Pt will complete grooming with min assist in standing at sink OT Short Term Goal 4 - Progress (Week 2): Met OT Short Term  Goal 5 (Week 2): Pt will complete tub/shower transfer with min assist OT Short Term Goal 5 - Progress (Week 2): Progressing toward goal Week 3:  OT Short Term Goal 1 (Week 3): STG = LTGs due to remaining LOS  Skilled Therapeutic Interventions/Progress Updates:    1) Pt seen for ADL retraining with focus on sit to stand, stand pivot transfer to/from toilet, integration of RUE in functional tasks, attention to RUE with bathing and dressing, and postural control in standing.  Pt demonstrated increased use and attention to RUE with locking w/c brakes, managing leg rests, and weight bearing in standing with bathing.  Pt demonstrated improved standing balance with min assist initially progressing to contact guard in standing with pulling up pants.    2) Engaged in home management task of completing laundry with focus on folding shirts, pants, and socks.  Encouraged pt to incorporate dominant RUE in tasks as much as possible even if it was just for stabilizing.  Pt reports having most difficulty with folding socks one handed - encouraged pt to use the edge of the table and stabilize ends of socks with RUE.  Completed folding of shirts and pants in standing with occasional steady assist and support at Rt knee to keep from buckling.  Pt put clothes away in dresser with squats to reacher lower drawers with min assist at hips for control and weight shifting.  Therapy Documentation Precautions:  Precautions Precautions: Fall Required Braces or Orthoses: Yes Other Brace/Splint: for gait: air  cast R ankle Restrictions Weight Bearing Restrictions: No Other Position/Activity Restrictions: right hemiplegia, decreased right side sensation, Dys. 3 with thin liquids General:   Vital Signs: Therapy Vitals Temp: 98.1 F (36.7 C) Temp src: Oral Pulse Rate: 84  Resp: 18  BP: 92/49 mmHg Patient Position, if appropriate: Lying Oxygen Therapy SpO2: 97 % Pain: Pain Assessment Pain Assessment: No/denies  pain ADL: ADL Grooming: Minimal assistance Where Assessed-Grooming: Standing at sink Upper Body Bathing: Minimal assistance Where Assessed-Upper Body Bathing: Shower Lower Body Bathing: Minimal assistance Where Assessed-Lower Body Bathing: Shower Upper Body Dressing: Minimal assistance Where Assessed-Upper Body Dressing: Sitting at sink Lower Body Dressing: Minimal assistance Where Assessed-Lower Body Dressing: Sitting at sink;Standing at sink Toileting: Moderate assistance Where Assessed-Toileting: Teacher, adult education: Curator Method: Surveyor, minerals: Engineer, technical sales: Moderate assistance Tub/Shower Transfer Method: Stand pivot Tub/Shower Equipment: Insurance underwriter: Moderate assistance Film/video editor Method: Warden/ranger: Shower seat with back  See FIM for current functional status  Therapy/Group: Individual Therapy  [name] 05/30/2011, 8:40 AM

## 2011-05-30 NOTE — Progress Notes (Signed)
Patient ID: Chelsey Gallagher, female   DOB: 1950-09-17, 61 y.o.   MRN: [id number]   Subjective/Complaints: RLE spasms improved.  L jaw pain in area of biopsy Review of Systems  Genitourinary: Positive for urgency and frequency. Negative for dysuria.  Neurological: Positive for focal weakness.  Psychiatric/Behavioral: The patient has insomnia.   All other systems reviewed and are negative.    Objective: Vital Signs: Blood pressure 92/49, pulse 84, temperature 98.1 F (36.7 C), temperature source Oral, resp. rate 18, height 5\' 3"  (1.6 m), weight 64.7 kg (142 lb 10.2 oz), SpO2 97.00%. No results found. Results for orders placed during the hospital encounter of 05/14/11 (from the past 72 hour(s))  GLUCOSE, CAPILLARY     Status: Abnormal   Collection Time   05/27/11 11:57 AM      Component Value Range Comment   Glucose-Capillary 125 (*) 70 - 99 (mg/dL)    Comment 1 Notify RN     GLUCOSE, CAPILLARY     Status: Abnormal   Collection Time   05/27/11  4:44 PM      Component Value Range Comment   Glucose-Capillary 155 (*) 70 - 99 (mg/dL)    Comment 1 Notify RN     GLUCOSE, CAPILLARY     Status: Abnormal   Collection Time   05/27/11  9:18 PM      Component Value Range Comment   Glucose-Capillary 114 (*) 70 - 99 (mg/dL)    Comment 1 Notify RN     GLUCOSE, CAPILLARY     Status: Abnormal   Collection Time   05/28/11  7:11 AM      Component Value Range Comment   Glucose-Capillary 122 (*) 70 - 99 (mg/dL)    Comment 1 Notify RN     GLUCOSE, CAPILLARY     Status: Abnormal   Collection Time   05/28/11 11:06 AM      Component Value Range Comment   Glucose-Capillary 117 (*) 70 - 99 (mg/dL)    Comment 1 Notify RN     GLUCOSE, CAPILLARY     Status: Abnormal   Collection Time   05/28/11  4:10 PM      Component Value Range Comment   Glucose-Capillary 143 (*) 70 - 99 (mg/dL)    Comment 1 Notify RN     GLUCOSE, CAPILLARY     Status: Normal   Collection Time   05/28/11  9:10 PM      Component  Value Range Comment   Glucose-Capillary 90  70 - 99 (mg/dL)    Comment 1 Notify RN     GLUCOSE, CAPILLARY     Status: Abnormal   Collection Time   05/29/11  7:38 AM      Component Value Range Comment   Glucose-Capillary 116 (*) 70 - 99 (mg/dL)    Comment 1 Notify RN     GLUCOSE, CAPILLARY     Status: Abnormal   Collection Time   05/29/11 11:37 AM      Component Value Range Comment   Glucose-Capillary 139 (*) 70 - 99 (mg/dL)    Comment 1 Notify RN     GLUCOSE, CAPILLARY     Status: Abnormal   Collection Time   05/29/11  4:45 PM      Component Value Range Comment   Glucose-Capillary 159 (*) 70 - 99 (mg/dL)    Comment 1 Notify RN     GLUCOSE, CAPILLARY     Status: Normal   Collection Time  05/29/11  9:39 PM      Component Value Range Comment   Glucose-Capillary 90  70 - 99 (mg/dL)   GLUCOSE, CAPILLARY     Status: Abnormal   Collection Time   05/30/11  7:23 AM      Component Value Range Comment   Glucose-Capillary 107 (*) 70 - 99 (mg/dL)    Comment 1 Notify RN      hysical Exam  Vitals reviewed.  Constitutional: She is oriented to person, place, and time. She appears well-developed.  HENT: Throat clr no thrush Head: Normocephalic. PERRL, EOMI  Neck: Normal range of motion. Neck with L submandibular tenderness no erythema. No thyromegaly present.  Cardiovascular: Normal rate and regular rhythm.  Pulmonary/Chest: Effort normal and breath sounds normal. She has no wheezes.  Abdominal: Soft. She exhibits no distension. There is no tenderness.  Musculoskeletal: She exhibits no edema.  Neurological: She is alert and oriented to person, place, and time. Increased tone right She is somewhat hard of hearing (Right more affected than left). She wears hearing aids on either ear. Shows good awareness of deficits. Follow three-step commands.Severely Dysarthric speech. Gaze intact. Poor visual acuity overall. Right facial weakness and lower facial sensory loss with tongue deviation.  RUE 0'/5.  RLE 2+ prox to 0/trace distally with ADF and 1+/5 APF. Left side motor and sensory nromal. . She can sense pain however.  Skin: Skin is warm and dry.  Psychiatric: She has a normal mood and affect.  Mild anxiety  Assessment/Plan: 1. 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 Physiatrist is providing close team supervision and 24 hour management of active medical problems listed below. Physiatrist and rehab team continue to assess barriers to discharge/monitor patient progress toward functional and medical goals. FIM: FIM - Bathing Bathing Steps Patient Completed: Chest;Right Arm;Abdomen;Front perineal area;Buttocks;Right upper leg;Left upper leg;Right lower leg (including foot);Left lower leg (including foot) Bathing: 4: Min-Patient completes 8-9 63f 10 parts or 75+ percent  FIM - Upper Body Dressing/Undressing Upper body dressing/undressing steps patient completed: Thread/unthread right sleeve of pullover shirt/dresss;Thread/unthread left sleeve of pullover shirt/dress;Put head through opening of pull over shirt/dress;Pull shirt over trunk Upper body dressing/undressing: 5: Supervision: Safety issues/verbal cues FIM - Lower Body Dressing/Undressing Lower body dressing/undressing steps patient completed: Thread/unthread right underwear leg;Thread/unthread left underwear leg;Pull underwear up/down;Thread/unthread right pants leg;Thread/unthread left pants leg;Pull pants up/down Lower body dressing/undressing: 4: Steadying Assist  FIM - Toileting Toileting steps completed by patient: Adjust clothing prior to toileting;Performs perineal hygiene Toileting Assistive Devices: Grab bar or rail for support Toileting: 3: Mod-Patient completed 2 of 3 steps  FIM - Diplomatic Services operational officer Devices: Grab bars Toilet Transfers: 4-To toilet/BSC: Min A  (steadying Pt. > 75%);4-From toilet/BSC: Min A (steadying Pt. > 75%)  FIM - Bed/Chair Transfer Bed/Chair Transfer Assistive Devices: HOB elevated Bed/Chair Transfer: 2: Supine > Sit: Max A (lifting assist/Pt. 25-49%);4: Sit > Supine: Min A (steadying pt. > 75%/lift 1 leg);3: Bed > Chair or W/C: Mod A (lift or lower assist);3: Chair or W/C > Bed: Mod A (lift or lower assist)  FIM - Locomotion: Wheelchair Distance: 150 Locomotion: Wheelchair: 1: Total Assistance/staff pushes wheelchair (Pt<25%) FIM - Locomotion: Ambulation Locomotion: Ambulation Assistive Devices: Designer, industrial/product Ambulation/Gait Assistance: 2: Max assist Locomotion: Ambulation: 1: Travels less than 50 ft with maximal assistance (Pt: 25 - 49%)  Comprehension Comprehension Mode: Auditory Comprehension: 6-Follows complex  conversation/direction: With extra time/assistive device  Expression Expression Mode: Verbal Expression: 6-Expresses complex ideas: With extra time/assistive device  Social Interaction Social Interaction: 6-Interacts appropriately with others with medication or extra time (anti-anxiety, antidepressant).  Problem Solving Problem Solving: 4-Solves basic 75 - 89% of the time/requires cueing 10 - 24% of the time  Memory Memory: 5-Recognizes or recalls 90% of the time/requires cueing < 10% of the time   Medical Problem List and Plan:  1. 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 DVT Prophylaxis/Anticoagulation: Subcutaneous lovenox. Monitor platelet counts and any signs of bleeding  3. Left neck mandible mass benign. C/o increasing pain exam just shows tenderness but no erythema.  Will ultrasound 4. 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 Plan outpatient followup for revascularization with intervention radiology  5. Diabetes mellitus. Hemoglobin A1c of 10.2. Lantus insulin 15 units at bedtime. Check blood sugars a.c. and at bedtime. Patient on glucophage 1000 mg twice daily prior to admission  resume 6. Hypertension. Lisinopril 40 mg daily. Low systolic may be zanaflex related will reduce ACE I            7. Tobacco abuse. Nicoderm patch.  8. Restless leg syndrome.this is complicated by spasticity, will increase zanaflex 9. Depression.celexa. Provide emotional support and positive reinforcement  10. Hyperlipidemia. Lipitor 11.  Urinary freq  UMN bladder trial ditropan improved P   LOS (Days) 16 A FACE TO FACE EVALUATION WAS PERFORMED  Rinda Rollyson E 05/30/2011, 8:45 AM

## 2011-05-31 LAB — GLUCOSE, CAPILLARY
Glucose-Capillary: 129 mg/dL — ABNORMAL HIGH (ref 70–99)
Glucose-Capillary: 104 mg/dL — ABNORMAL HIGH (ref 70–99)
Glucose-Capillary: 124 mg/dL — ABNORMAL HIGH (ref 70–99)

## 2011-05-31 NOTE — Progress Notes (Signed)
Occupational Therapy Session Note  Patient Details  Name: Chelsey Gallagher MRN: [id number] Date of Birth: Jul 24, 1950  Today's Date: 05/31/2011 Time: 6295-2841 Time Calculation (min): 85 min  Short Term Goals: Week 3:  OT Short Term Goal 1 (Week 3): STG = LTGs due to remaining LOS  Skilled Therapeutic Interventions/Progress Updates:    Engaged in neuromuscular re education of right upper extremity, functional mobillity, and balance.  Practiced RUE closed chain and exercises in sidelying.  Practiced incorporating grasp and release activities for facilitation of movement with instruction to practice opening fingers with using other hand and flexing wrist.     Instructed Chelsey Gallagher to assist with upper extremity exercises and gave written handout.  Ambulated with RW for about 30 feet with complaints of being tired near end.  RLE adducting with stepping.    Therapy Documentation Precautions:  Precautions Precautions: Fall Required Braces or Orthoses: Yes Other Brace/Splint: for gait: air cast R ankle Restrictions Weight Bearing Restrictions: No Other Position/Activity Restrictions: right hemiplegia, decreased right side sensation, Dys. 3 with thin liquids  Pain: 2/10 shoulder   Therapy/Group: Individual Therapy  Humberto Seals 05/31/2011, 6:27 PM

## 2011-05-31 NOTE — Progress Notes (Signed)
Patient ID: Chelsey Gallagher, female   DOB: 06-04-1950, 61 y.o.   MRN: [id number] Patient ID: Chelsey Gallagher, female   DOB: [birth year], 61 y.o.   MRN: [id number]   Subjective/Complaints:  L jaw pain in area of biopsy. Pain to palpation. No erythema. No discharge. Review of Systems  Eyes: Negative for blurred vision.  Respiratory: Negative for cough.   Genitourinary: Negative for dysuria.  Neurological: Negative for headaches.  All other systems reviewed and are negative.     Objective: Vital Signs: Blood pressure 87/60, pulse 101, temperature 98.3 F (36.8 C), temperature source Oral, resp. rate 18, height 5\' 3"  (1.6 m), weight 142 lb 10.2 oz (64.7 kg), SpO2 96.00%. Alert, well-developed female in no acute distress. HEENT exam atraumatic, normocephalic. She does have a palpable mass underneath her left mandible. Mildly tender to palpation. No fluctuance. Chest clear to auscultation cardiac exam S1 and S2 are regular.  US Soft Tissue Head/neck  05/30/2011  *RADIOLOGY REPORT*  Clinical Data: Left neck nodal biopsy 05/14/2011.  Left neck pain with onset yesterday.  ULTRASOUND OF HEAD/NECK SOFT TISSUES  Technique:  Ultrasound examination of the head and neck soft tissues was performed in the area of clinical concern.  Comparison:  CTA 05/09/2011.  Images obtained during biopsy 05/14/2011.  Findings: The hypoechoic nodal lesion near the left angle of mandible measures 2.6 x 1.4 x 1.6 cm.  This lesion was not fully imaged on the prior ultrasound images, but the appearance is grossly unchanged.  There is no increased echogenicity within or surrounding the lesion to suggest hemorrhage.  No active bleeding is identified.  No new masses are seen.  IMPRESSION: No significant change in the necrotic/cystic nodal lesion at the left angle of mandible.  No evidence of surrounding hemorrhage.  Original Report Authenticated By: Gerrianne Scale, M.D.    hysical Exam  Vitals reviewed.  Constitutional: She  is oriented to person, place, and time. She appears well-developed.  HENT: Throat clr no thrush Head: Normocephalic. PERRL, EOMI  Neck: Normal range of motion. Neck with L submandibular tenderness no erythema. No thyromegaly present.  Cardiovascular: Normal rate and regular rhythm.  Pulmonary/Chest: Effort normal and breath sounds normal. She has no wheezes.  Abdominal: Soft. She exhibits no distension. There is no tenderness.  Musculoskeletal: She exhibits no edema.  Neurological: She is alert and oriented to person, place, and time. Increased tone right She is somewhat hard of hearing (Right more affected than left). She wears hearing aids on either ear. Shows good awareness of deficits. Follow three-step commands.Severely Dysarthric speech. Gaze intact. Poor visual acuity overall. Right facial weakness and lower facial sensory loss with tongue deviation.  RUE 0'/5. RLE 2+ prox to 0/trace distally with ADF and 1+/5 APF. Left side motor and sensory nromal. . She can sense pain however.  Skin: Skin is warm and dry.  Psychiatric: She has a normal mood and affect.  Mild anxiety  Assessment/Plan: 1. 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 Physiatrist is providing close team supervision and 24 hour management of active medical problems listed below. Physiatrist and rehab team continue to assess barriers to discharge/monitor patient progress toward functional and medical goals. FIM: FIM - Bathing Bathing Steps Patient Completed: Chest;Right Arm;Abdomen;Front perineal area;Buttocks;Right upper leg;Left upper leg;Right lower leg (including foot);Left lower leg (including foot) Bathing: 4: Min-Patient completes 8-9 28f 10 parts or 75+ percent  FIM - Upper Body Dressing/Undressing Upper body dressing/undressing steps patient completed: Thread/unthread right sleeve of  pullover shirt/dresss;Thread/unthread left sleeve of pullover shirt/dress;Put head through opening of pull over shirt/dress;Pull shirt over trunk Upper body dressing/undressing: 5: Supervision: Safety issues/verbal cues FIM - Lower Body Dressing/Undressing Lower body dressing/undressing steps patient completed: Thread/unthread right underwear leg;Thread/unthread left underwear leg;Pull underwear up/down;Thread/unthread right pants leg;Thread/unthread left pants leg;Pull pants up/down Lower body dressing/undressing: 4: Steadying Assist  FIM - Toileting Toileting steps completed by patient: Adjust clothing prior to toileting Toileting Assistive Devices: Grab bar or rail for support Toileting: 3: Mod-Patient completed 2 of 3 steps  FIM - Diplomatic Services operational officer Devices: Grab bars Toilet Transfers: 4-To toilet/BSC: Min A (steadying Pt. > 75%)  FIM - Bed/Chair Transfer Bed/Chair Transfer Assistive Devices: HOB elevated Bed/Chair Transfer: 4: Chair or W/C > Bed: Min A (steadying Pt. > 75%);4: Bed > Chair or W/C: Min A (steadying Pt. > 75%)  FIM - Locomotion: Wheelchair Distance: 150 Locomotion: Wheelchair: 1: Total Assistance/staff pushes wheelchair (Pt<25%) FIM - Locomotion: Ambulation Locomotion: Ambulation Assistive Devices: Orthosis;Other (comment) (Bioness and wall rail) Ambulation/Gait Assistance: 3: Mod assist Locomotion: Ambulation: 1: Travels less than 50 ft with moderate assistance (Pt: 50 - 74%)  Comprehension Comprehension Mode: Auditory Comprehension: 6-Follows complex conversation/direction: With extra time/assistive device  Expression Expression Mode: Verbal Expression: 6-Expresses complex ideas: With extra time/assistive device  Social Interaction Social Interaction: 7-Interacts appropriately with others - No medications needed.  Problem Solving Problem Solving: 5-Solves complex 90% of the time/cues < 10% of the time  Memory Memory: 5-Recognizes  or recalls 90% of the time/requires cueing < 10% of the time   Medical Problem List and Plan:  1. 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 DVT Prophylaxis/Anticoagulation: Subcutaneous lovenox. Monitor platelet counts and any signs of bleeding  3. Left neck mandible mass benign. C/o increasing pain exam just shows tenderness but no erythema.  Will ultrasound Repeat ultrasound. No significant change. I will follow for one more day. There is a possibility that this could be infectious but given the unchanged ultrasound results I think this is unlikely. 4. 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 Plan outpatient followup for revascularization with intervention radiology  5. Diabetes mellitus. Hemoglobin A1c of 10.2. Lantus insulin 15 units at bedtime. Check blood sugars a.c. and at bedtime. Patient on glucophage 1000 mg twice daily prior to admission resume 6. Hypertension. Lisinopril 40 mg daily. Low systolic may be zanaflex related will reduce ACE I            7. Tobacco abuse. Nicoderm patch.  8. Restless leg syndrome.this is complicated by spasticity, will increase zanaflex 9. Depression.celexa. Provide emotional support and positive reinforcement  10. Hyperlipidemia. Lipitor 11.  Urinary freq   LOS (Days) 17 A FACE TO FACE EVALUATION WAS PERFORMED  Fedor Kazmierski HENRY 05/31/2011, 8:56 AM

## 2011-06-01 LAB — GLUCOSE, CAPILLARY
Glucose-Capillary: 106 mg/dL — ABNORMAL HIGH (ref 70–99)
Glucose-Capillary: 120 mg/dL — ABNORMAL HIGH (ref 70–99)

## 2011-06-01 NOTE — Progress Notes (Signed)
Physical Therapy Note  Patient Details  Name: Tyreisha Ungar MRN: [id number] Date of Birth: 21-Jul-1950 Today's Date: 06/01/2011  [id number] (55 minutes) individual Pain: no complaint of pain Focus of treatment: RT LE neuro re-ed; gait training ; static standing balance training Treatment: wc mobility- pt mod independent on unit using one arm drive wc (left) 308+ feet ,level; transfers SPT min assist; sit to supine min assist RT LE; Neuro re-ed- RT AA hip flexion focusing on decreasing hip ER; manual leg presses; AA RT hip abduction; A hip ER/IR; AA ankle DF in hooklying; gait 20 feet X 2 mod assist with RT AFO for balance. Pt continues to IR RT LE in stance and tactile cues to prevent excessive trunk extension to advance RT LE in swing; sit to stand focusing on equal weight bearing bilateral LEs mod assist with increased lean to right; static standing min assist with loss of balance to right - pt has decreased facilitation of hip strategy to maintain static standing.   Nimrod Wendt,JIM 06/01/2011, 11:40 AM

## 2011-06-01 NOTE — Progress Notes (Signed)
Patient ID: Chelsey Gallagher, female   DOB: 05-02-1950, 61 y.o.   MRN: [id number]  Subjective/Complaints:  L jaw pain in area of biopsy. Pain to palpation. No erythema. No discharge. She thinks pain is somewhat diminished today. Still uncomfortable. Review of Systems  Eyes: Negative for blurred vision.  Respiratory: Negative for cough.   Genitourinary: Negative for dysuria.  Neurological: Negative for headaches.  All other systems reviewed and are negative.     Objective: Vital Signs: Blood pressure 82/58, pulse 89, temperature 97.8 F (36.6 C), temperature source Oral, resp. rate 18, height 5\' 3"  (1.6 m), weight 142 lb 10.2 oz (64.7 kg), SpO2 95.00%. Alert, well-developed female in no acute distress. HEENT exam atraumatic, normocephalic. She does have a palpable mass underneath her left mandible. Mildly tender to palpation. No fluctuance. Chest clear to auscultation cardiac exam S1 and S2 are regular.  US Soft Tissue Head/neck  05/30/2011  *RADIOLOGY REPORT*  Clinical Data: Left neck nodal biopsy 05/14/2011.  Left neck pain with onset yesterday.  ULTRASOUND OF HEAD/NECK SOFT TISSUES  Technique:  Ultrasound examination of the head and neck soft tissues was performed in the area of clinical concern.  Comparison:  CTA 05/09/2011.  Images obtained during biopsy 05/14/2011.  Findings: The hypoechoic nodal lesion near the left angle of mandible measures 2.6 x 1.4 x 1.6 cm.  This lesion was not fully imaged on the prior ultrasound images, but the appearance is grossly unchanged.  There is no increased echogenicity within or surrounding the lesion to suggest hemorrhage.  No active bleeding is identified.  No new masses are seen.  IMPRESSION: No significant change in the necrotic/cystic nodal lesion at the left angle of mandible.  No evidence of surrounding hemorrhage.  Original Report Authenticated By: Chelsey Gallagher, M.D.     Assessment/Plan: 1. Functional deficits secondary to Left medullary  and cerebellar infarct  which require 3+ hours per day of interdisciplinary therapy in a comprehensive inpatient rehab setting. Physiatrist is providing close team supervision and 24 hour management of active medical problems listed below. Physiatrist and rehab team continue to assess barriers to discharge/monitor patient progress toward functional and medical goals. FIM: FIM - Bathing Bathing Steps Patient Completed: Chest;Right Arm;Abdomen;Front perineal area;Buttocks;Right upper leg;Left upper leg;Right lower leg (including foot);Left lower leg (including foot) Bathing: 4: Min-Patient completes 8-9 25f 10 parts or 75+ percent  FIM - Upper Body Dressing/Undressing Upper body dressing/undressing steps patient completed: Thread/unthread right sleeve of pullover shirt/dresss;Thread/unthread left sleeve of pullover shirt/dress;Put head through opening of pull over shirt/dress;Pull shirt over trunk Upper body dressing/undressing: 5: Supervision: Safety issues/verbal cues FIM - Lower Body Dressing/Undressing Lower body dressing/undressing steps patient completed: Thread/unthread right underwear leg;Thread/unthread left underwear leg;Pull underwear up/down;Thread/unthread right pants leg;Thread/unthread left pants leg;Pull pants up/down Lower body dressing/undressing: 4: Steadying Assist  FIM - Toileting Toileting steps completed by patient: Adjust clothing prior to toileting;Adjust clothing after toileting Toileting Assistive Devices: Grab bar or rail for support Toileting: 3: Mod-Patient completed 2 of 3 steps  FIM - Diplomatic Services operational officer Devices: Grab bars Toilet Transfers: 4-To toilet/BSC: Min A (steadying Pt. > 75%)  FIM - Bed/Chair Transfer Bed/Chair Transfer Assistive Devices: HOB elevated Bed/Chair Transfer: 4: Chair or W/C > Bed: Min A (steadying Pt. > 75%);4: Bed > Chair or W/C: Min A (steadying Pt. > 75%)  FIM - Locomotion: Wheelchair Distance: 150 Locomotion:  Wheelchair: 1: Total Assistance/staff pushes wheelchair (Pt<25%) FIM - Locomotion: Ambulation Locomotion: Ambulation Assistive Devices: Orthosis;Other (comment) (Bioness and  wall rail) Ambulation/Gait Assistance: 3: Mod assist Locomotion: Ambulation: 1: Travels less than 50 ft with moderate assistance (Pt: 50 - 74%)  Comprehension Comprehension Mode: Auditory Comprehension: 6-Follows complex conversation/direction: With extra time/assistive device  Expression Expression Mode: Verbal Expression: 6-Expresses complex ideas: With extra time/assistive device  Social Interaction Social Interaction: 7-Interacts appropriately with others - No medications needed.  Problem Solving Problem Solving: 5-Solves basic 90% of the time/requires cueing < 10% of the time  Memory Memory: 5-Recognizes or recalls 90% of the time/requires cueing < 10% of the time   Medical Problem List and Plan:  1. 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 DVT Prophylaxis/Anticoagulation: Subcutaneous lovenox. Monitor platelet counts and any signs of bleeding  3. Left neck mandible mass benign. C/o increasing pain exam just shows tenderness but no erythema.  Will ultrasound Repeat ultrasound. No significant change. I will follow for one more day. There is a possibility that this could be infectious but given the unchanged ultrasound results I think this is unlikely. 4. 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 Plan outpatient followup for revascularization with intervention radiology  5. Diabetes mellitus. Hemoglobin A1c of 10.2. Lantus insulin 15 units at bedtime. Check blood sugars a.c. and at bedtime. Patient on glucophage 1000 mg twice daily prior to admission resume 6. Hypertension. Lisinopril 40 mg daily. Low systolic may be zanaflex related will reduce ACE I            7. Tobacco abuse. Nicoderm patch.  8. Restless leg syndrome.this is complicated by spasticity, will increase zanaflex 9. Depression.celexa.  Provide emotional support and positive reinforcement  10. Hyperlipidemia. Lipitor 11.  Urinary freq   LOS (Days) 18 A FACE TO FACE EVALUATION WAS PERFORMED  Chelsey Gallagher 06/01/2011, 8:53 AM

## 2011-06-02 DIAGNOSIS — I6330 Cerebral infarction due to thrombosis of unspecified cerebral artery: Secondary | ICD-10-CM

## 2011-06-02 DIAGNOSIS — G8110 Spastic hemiplegia affecting unspecified side: Secondary | ICD-10-CM

## 2011-06-02 DIAGNOSIS — Z5189 Encounter for other specified aftercare: Secondary | ICD-10-CM

## 2011-06-02 LAB — GLUCOSE, CAPILLARY
Glucose-Capillary: 101 mg/dL — ABNORMAL HIGH (ref 70–99)
Glucose-Capillary: 141 mg/dL — ABNORMAL HIGH (ref 70–99)

## 2011-06-02 MED ORDER — LISINOPRIL 10 MG PO TABS
10.0000 mg | ORAL_TABLET | Freq: Every day | ORAL | Status: DC
  Administered 2011-06-03 – 2011-06-04 (×2): 10 mg via ORAL
  Filled 2011-06-02 (×3): qty 1

## 2011-06-02 NOTE — Progress Notes (Signed)
Recreational Therapy Session Note  Patient Details  Name: Gelila Well MRN: [id number] Date of Birth: 1950-03-22 Today's Date: 06/02/2011  Pain: no c/o pain Skilled Therapeutic Interventions/Progress Updates: Discussed community reintegration and pt agreeable to participate in outing tomorrow to [employer].  Discussed purpose of outing and potential goals with pt, pt's daughter, &  Pt's husband.  Therapy/Group: Individual Therapy  Activity Level: Simple:  Level of assist: Supervision  Mily Malecki 06/02/2011, 10:55 AM

## 2011-06-02 NOTE — Progress Notes (Signed)
Per State Regulation 482.30 This chart was reviewed for medical necessity with respect to the patient's Admission/Duration of stay. Pt continues to participate and progress toward goals. BP fluctuating, med adjusted. Pt's spouse and friend in for education today in preparation for d/c on 4/3. Discussed DM education needs, reviewed Health Resource Notebook, smoking cessation, provided PHP and med calendar. Pt and CGs expressed understanding of above. Nursing to follow-up with teaching CBG checks and insulin administration.  Meryl Dare                 Nurse Care Manager            Next Review Date: 06/04/11

## 2011-06-02 NOTE — Progress Notes (Signed)
Nutrition Follow-up  Pt eating 50 - 100% of meals. Intake improved. Continues on CHO Modified Medium diet.  Diet Order:  CHO Modified Medium Supplement: Resource Breeze daily  Meds: Scheduled Meds:   . aspirin  81 mg Oral Daily  . atorvastatin  40 mg Oral q1800  . citalopram  20 mg Oral Daily  . enoxaparin  40 mg Subcutaneous Q24H  . feeding supplement  1 Container Oral Daily  . gabapentin  800 mg Oral QHS  . insulin aspart  0-15 Units Subcutaneous TID WC  . insulin glargine  15 Units Subcutaneous QHS  . lisinopril  10 mg Oral Daily  . metFORMIN  500 mg Oral BID WC  . nicotine  21 mg Transdermal Daily  . oxybutynin  2.5 mg Oral BID  . senna  1 tablet Oral BID  . silver sulfADIAZINE   Topical TID  . tiZANidine  4 mg Oral QHS  . DISCONTD: lisinopril  20 mg Oral Daily   Continuous Infusions:  PRN Meds:.acetaminophen, bisacodyl, methocarbamol, ondansetron (ZOFRAN) IV, polyethylene glycol, sorbitol, traZODone  Labs:  CMP     Component Value Date/Time   NA 135 05/15/2011 0650   K 4.1 05/15/2011 0650   CL 97 05/15/2011 0650   CO2 31 05/15/2011 0650   GLUCOSE 141* 05/15/2011 0650   BUN 14 05/15/2011 0650   CREATININE 0.56 05/15/2011 0650   CALCIUM 10.0 05/15/2011 0650   PROT 6.9 05/15/2011 0650   ALBUMIN 3.4* 05/15/2011 0650   AST 27 05/15/2011 0650   ALT 27 05/15/2011 0650   ALKPHOS 95 05/15/2011 0650   BILITOT 0.7 05/15/2011 0650   GFRNONAA >90 05/15/2011 0650   GFRAA >90 05/15/2011 0650   CBG (last 3)   Basename 06/02/11 1131 06/02/11 0721 06/01/11 2056  GLUCAP 176* 145* 141*     Intake/Output Summary (Last 24 hours) at 06/02/11 1532 Last data filed at 06/02/11 1314  Gross per 24 hour  Intake    840 ml  Output      0 ml  Net    840 ml    Weight Status:  64.7 kg (3/20)  Estimated needs:  1550 - 1800 kcal, 80 - 90 grams protein, 1.6 - 1.8 L/d  Nutrition Dx:  none  Goal:  Pt to consume >/= 75% of meals and supplements. Met on average.  Intervention:  Continue current  interventions; pt eating well at this time.  Monitor:  Weights, labs, PO intake, I/O's  Adair Laundry Pager #:  [phone]

## 2011-06-02 NOTE — Progress Notes (Signed)
Occupational Therapy Session Note  Patient Details  Name: Saanvi Hakala MRN: [id number] Date of Birth: March 02, 1951  Today's Date: 06/02/2011 Time: 0730-0830 and 1345-1430 Time Calculation (min): 60 min and 45 min  Short Term Goals: Week 3:  OT Short Term Goal 1 (Week 3): STG = LTGs due to remaining LOS  Skilled Therapeutic Interventions/Progress Updates:    1) Pt seen for ADL retraining with focus on increased independence with transfers, sit to stand, and LB dressing.  Pt continues to require min verbal cues for RLE setup prior to sit to stand.  Pt with increased independence with sit to stand and with stand pivot transfer into walk-in shower with use of grab bars.  Pt requiring min/steady assist in standing.  Focus on incorporation of RUE with self-cares whether performing tasks with BUE or weight bearing through RUE when not in use.  2) Engaged in transfer training from w/c to tub bench in tub/shower.  Focus on stand pivot transfer from w/c to tub bench with cues provided by this therapist and progression to pt directing care and explaining each step as if providing cues to husband or daughter who will be assisting with care at home.  Pt able to recall safety, positioning of w/c and RLE, and locking brakes all prior to transfer.  Pt requires min/steady assist with sit to stand and with pivoting.  Toilet transfer with ambulation approx 5 feet to toilet from tub.  Pt able to complete 3 of 3 steps of toileting (hygiene and clothing management).    Therapy Documentation Precautions:  Precautions Precautions: Fall Required Braces or Orthoses: Yes Other Brace/Splint: for gait: air cast R ankle Restrictions Weight Bearing Restrictions: No Other Position/Activity Restrictions: right hemiplegia, decreased right side sensation, Dys. 3 with thin liquids General:   Vital Signs: Therapy Vitals Temp: 97.7 F (36.5 C) Temp src: Oral Pulse Rate: 64  Resp: 18  BP: 100/62 mmHg Patient  Position, if appropriate: Lying Oxygen Therapy SpO2: 96 % O2 Device: None (Room air) Pain: Pain Assessment Pain Assessment: No/denies pain Pain Score: 0-No pain  See FIM for current functional status  Therapy/Group: Individual Therapy  [name] 06/02/2011, 9:07 AM

## 2011-06-02 NOTE — Progress Notes (Signed)
Physical Therapy Session Note  Patient Details  Name: Chelsey Gallagher MRN: [id number] Date of Birth: 1950/08/09  Today's Date: 06/02/2011 Time: 1107-1212 and 3086-5784 Time Calculation (min): 65 min and 30 min  Short Term Goals: Week 3:  PT Short Term Goal 1 (Week 3): Pt will perform all bed mobilty modifiied independence. PT Short Term Goal 2 (Week 3): Pt will perform car transfer with min assist. PT Short Term Goal 3 (Week 3): Pt will perform w/c mobility in home environment x 50' with supervision. PT Short Term Goal 4 (Week 3): Pt will perform gait x 100' in controlled environment with min asistance. PT Short Term Goal 5 (Week 3): Pt will ascend and descend 4 steps with one rail with min assistance.  Skilled Therapeutic Interventions/Progress Updates:   Patient's family present for family education and mobility training: discussed with patient and her family home set up, barriers within home: width of bathroom door for w/c vs. RW, steps to enter home, equipment needs, loaner w/c to be delivered by [employer] and [employer].  Husband to measure bathroom door and will bring in patient's RW from home to assess appropriateness of RW; patient to be issues R hand splint for RW.    Therapy Documentation Precautions:  Precautions Precautions: Fall Required Braces or Orthoses: Yes Other Brace/Splint: for gait: air cast R ankle Restrictions Weight Bearing Restrictions: No Other Position/Activity Restrictions: right hemiplegia, decreased right side sensation, Dys. 3 with thin liquids Pain: Pain Assessment Pain Assessment: No/denies pain; reported pain on R ankle from pressure from AFO; P&O aware and had already taken AFO to flare lateral edge and add padding; returned with AFO and donned; patient reported improved pressure relief and comfort. Mobility:  Discussed car transfers with family; patient will likely have to transfer to mid size SUV through stand pivots.  Practiced with patient  how to perform w/c <> car transfer with RW stand pivot x 2-3 reps with cues for sit to stand sequence, hand placement on hand splint and removal of hand prior to sitting, sequence of bringing LE into and out of car with attention to position of RUE, and safety with RW.  When return demonstrated with husband patient unable to perform safely: distracted by conversation patient inconsistent with sit to stand, attention to RUE on RW, and poor sequencing of stand pivot transfer with RW with multiple LOB with mod-max A for stand pivot, supervision for bringing LE in and out of car.  Removed RW and practiced stand pivot w/c <> car with LUE on grab bar or door; patient was able to perform transfer with this method with therapist and family with light min A for sit to stand with improved safety; discussed with family parking in spots with enough room to allow patient to bring w/c close to passenger side door to avoid use of RW right now.    Pm session: Discussed with patient set up of her bathroom at home and demonstrated to patient how to perform safe w/c <> toilet transfer ambulating in bathroom with RW if w/c does not fit through door; patient gave repeat demonstration with min A and verbal cues for sequence and safety with RW; will demonstrate to husband tomorrow.  Locomotion : Discussed with patient home entry and exit; reports one threshold step from carport to kitchen; gait training with RW up and down one step threshold x 2 reps with mod-max A for safe AD management on and off step, safety and sequence of stepping and to maintain  balance.  Patient will not be safe at this time to perform home entry and exit with RW; demonstrated and verbalized sequence to patient how to bump w/c up and down one step threshold for safer home entry and exit; will demonstrate to husband tomorrow.  Patient to continue to work on steps with HHPT. Wheelchair Mobility Distance: 200 mod I controlled environment  See FIM for current  functional status  Therapy/Group: Individual Therapy  Edman Circle [facility] 06/02/2011, 12:56 PM

## 2011-06-02 NOTE — Plan of Care (Signed)
Problem: RH SKIN INTEGRITY Goal: RH STG SKIN FREE OF INFECTION/BREAKDOWN No new breakdown, min assist with turning/repositioning/skin breakdown prevention Outcome: Progressing Small area at umbilicus being monitored.  Comments:  Site of previous surgery, constantly "rubbed" with pulling up/down of pants.

## 2011-06-02 NOTE — Plan of Care (Signed)
Problem: RH BOWEL ELIMINATION Goal: RH STG MANAGE BOWEL WITH ASSISTANCE STG Manage Bowel with mod independence  Outcome: Progressing No longer requiring senokott and no more loose stools

## 2011-06-02 NOTE — Plan of Care (Signed)
Problem: RH SAFETY Goal: RH STG ADHERE TO SAFETY PRECAUTIONS W/ASSISTANCE/DEVICE STG Adhere to Safety Precautions With supervision  Outcome: Adequate for Discharge Using appropriate transfers from w/c, ie, locking brakes, placing chair correctly, removing legrests.

## 2011-06-02 NOTE — Progress Notes (Signed)
Patient ID: Chelsey Gallagher, female   DOB: 1950-10-07, 61 y.o.   MRN: [id number]   Subjective/Complaints: RLE spasms improved.  L jaw pain in area of biopsy improved.  Ultrasound is neg.  Bleeding lower aspect of old midline incision Review of Systems  Genitourinary: Positive for urgency and frequency. Negative for dysuria.  Neurological: Positive for focal weakness.  Psychiatric/Behavioral: The patient has insomnia.   All other systems reviewed and are negative.    Objective: Vital Signs: Blood pressure 100/62, pulse 64, temperature 97.7 F (36.5 C), temperature source Oral, resp. rate 18, height 5\' 3"  (1.6 m), weight 64.7 kg (142 lb 10.2 oz), SpO2 96.00%. No results found. Results for orders placed during the hospital encounter of 05/14/11 (from the past 72 hour(s))  GLUCOSE, CAPILLARY     Status: Abnormal   Collection Time   05/30/11 12:03 PM      Component Value Range Comment   Glucose-Capillary 101 (*) 70 - 99 (mg/dL)    Comment 1 Notify RN     GLUCOSE, CAPILLARY     Status: Abnormal   Collection Time   05/30/11  4:44 PM      Component Value Range Comment   Glucose-Capillary 126 (*) 70 - 99 (mg/dL)    Comment 1 Notify RN     GLUCOSE, CAPILLARY     Status: Abnormal   Collection Time   05/30/11  9:12 PM      Component Value Range Comment   Glucose-Capillary 101 (*) 70 - 99 (mg/dL)    Comment 1 Notify RN     GLUCOSE, CAPILLARY     Status: Abnormal   Collection Time   05/31/11  8:00 AM      Component Value Range Comment   Glucose-Capillary 129 (*) 70 - 99 (mg/dL)    Comment 1 Notify RN     GLUCOSE, CAPILLARY     Status: Abnormal   Collection Time   05/31/11 11:02 AM      Component Value Range Comment   Glucose-Capillary 104 (*) 70 - 99 (mg/dL)   GLUCOSE, CAPILLARY     Status: Abnormal   Collection Time   05/31/11  4:18 PM      Component Value Range Comment   Glucose-Capillary 124 (*) 70 - 99 (mg/dL)    Comment 1 Notify RN     GLUCOSE, CAPILLARY     Status: Abnormal   Collection Time   05/31/11  8:44 PM      Component Value Range Comment   Glucose-Capillary 106 (*) 70 - 99 (mg/dL)    Comment 1 Notify RN     GLUCOSE, CAPILLARY     Status: Abnormal   Collection Time   06/01/11  7:31 AM      Component Value Range Comment   Glucose-Capillary 106 (*) 70 - 99 (mg/dL)   GLUCOSE, CAPILLARY     Status: Abnormal   Collection Time   06/01/11 11:27 AM      Component Value Range Comment   Glucose-Capillary 137 (*) 70 - 99 (mg/dL)   GLUCOSE, CAPILLARY     Status: Abnormal   Collection Time   06/01/11  4:18 PM      Component Value Range Comment   Glucose-Capillary 120 (*) 70 - 99 (mg/dL)   GLUCOSE, CAPILLARY     Status: Abnormal   Collection Time   06/01/11  8:56 PM      Component Value Range Comment   Glucose-Capillary 141 (*) 70 - 99 (mg/dL)  Comment 1 Notify RN     GLUCOSE, CAPILLARY     Status: Abnormal   Collection Time   06/02/11  7:21 AM      Component Value Range Comment   Glucose-Capillary 145 (*) 70 - 99 (mg/dL)    Comment 1 Notify RN      hysical Exam  Vitals reviewed.  Constitutional: She is oriented to person, place, and time. She appears well-developed.  HENT: Throat clr no thrush Head: Normocephalic. PERRL, EOMI  Neck: Normal range of motion. Neck with L submandibular tenderness no erythema. No thyromegaly present.  Cardiovascular: Normal rate and regular rhythm.  Pulmonary/Chest: Effort normal and breath sounds normal. She has no wheezes.  Abdominal: Soft. She exhibits no distension. There is no tenderness.  Musculoskeletal: She exhibits no edema.  Neurological: She is alert and oriented to person, place, and time. Increased tone right She is somewhat hard of hearing (Right more affected than left). She wears hearing aids on either ear. Shows good awareness of deficits. Follow three-step commands.Severely Dysarthric speech. Gaze intact. Poor visual acuity overall. Right facial weakness and lower facial sensory loss with tongue deviation.   RUE 0'/5. RLE 2+ prox to 0/trace distally with ADF and 1+/5 APF. Left side motor and sensory nromal. . She can sense pain however.  Skin: Skin is warm and dry.  Psychiatric: She has a normal mood and affect.  Mild anxiety  Assessment/Plan: 1. 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 Physiatrist is providing close team supervision and 24 hour management of active medical problems listed below. Physiatrist and rehab team continue to assess barriers to discharge/monitor patient progress toward functional and medical goals. FIM: FIM - Bathing Bathing Steps Patient Completed: Chest;Right Arm;Abdomen;Front perineal area;Buttocks;Right upper leg;Left upper leg;Right lower leg (including foot);Left lower leg (including foot) Bathing: 4: Min-Patient completes 8-9 49f 10 parts or 75+ percent  FIM - Upper Body Dressing/Undressing Upper body dressing/undressing steps patient completed: Thread/unthread right sleeve of pullover shirt/dresss;Thread/unthread left sleeve of pullover shirt/dress;Put head through opening of pull over shirt/dress;Pull shirt over trunk Upper body dressing/undressing: 5: Supervision: Safety issues/verbal cues FIM - Lower Body Dressing/Undressing Lower body dressing/undressing steps patient completed: Thread/unthread right underwear leg;Thread/unthread left underwear leg;Pull underwear up/down;Thread/unthread right pants leg;Thread/unthread left pants leg;Pull pants up/down Lower body dressing/undressing: 4: Steadying Assist  FIM - Toileting Toileting steps completed by patient: Adjust clothing prior to toileting;Adjust clothing after toileting Toileting Assistive Devices: Grab bar or rail for support Toileting: 3: Mod-Patient completed 2 of 3 steps  FIM - Diplomatic Services operational officer Devices: Grab bars Toilet Transfers: 4-To  toilet/BSC: Min A (steadying Pt. > 75%)  FIM - Bed/Chair Transfer Bed/Chair Transfer Assistive Devices: HOB elevated Bed/Chair Transfer: 4: Chair or W/C > Bed: Min A (steadying Pt. > 75%);4: Bed > Chair or W/C: Min A (steadying Pt. > 75%)  FIM - Locomotion: Wheelchair Distance: 150 Locomotion: Wheelchair: 1: Total Assistance/staff pushes wheelchair (Pt<25%) FIM - Locomotion: Ambulation Locomotion: Ambulation Assistive Devices: Orthosis;Other (comment) (Bioness and wall rail) Ambulation/Gait Assistance: 3: Mod assist Locomotion: Ambulation: 1: Travels less than 50 ft with moderate assistance (Pt: 50 - 74%)  Comprehension Comprehension Mode: Auditory Comprehension: 6-Follows complex conversation/direction: With extra time/assistive device  Expression Expression Mode: Verbal Expression: 6-Expresses complex ideas: With extra time/assistive device  Social Interaction Social Interaction: 7-Interacts appropriately with others - No medications needed.  Problem Solving Problem Solving: 5-Solves basic  90% of the time/requires cueing < 10% of the time  Memory Memory: 5-Recognizes or recalls 90% of the time/requires cueing < 10% of the time   Medical Problem List and Plan:  1. 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 DVT Prophylaxis/Anticoagulation: Subcutaneous lovenox. Monitor platelet counts and any signs of bleeding .  Pt may have had abrasion to lower abd incision and bleeding due to lovenox.  No sig Blood loss monitor 3. Left neck mandible mass benign. C/o increasing pain exam just shows tenderness but no erythema.  Ultrasound- 4. 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 Plan outpatient followup for revascularization with intervention radiology  5. Diabetes mellitus. Hemoglobin A1c of 10.2. Lantus insulin 15 units at bedtime. Check blood sugars a.c. and at bedtime. Patient on glucophage 1000 mg twice daily prior to admission resume 6. Hypertension. Lisinopril 40 mg daily.  Low systolic may be zanaflex related will reduce ACE I            7. Tobacco abuse. Nicoderm patch.  8. Restless leg syndrome.this is complicated by spasticity,  zanaflex 9. Depression.celexa. Provide emotional support and positive reinforcement  10. Hyperlipidemia. Lipitor 11.  Urinary freq  UMN bladder trial ditropan improved P   LOS (Days) 19 A FACE TO FACE EVALUATION WAS PERFORMED  Tesslyn Baumert E 06/02/2011, 9:06 AM

## 2011-06-03 DIAGNOSIS — G8110 Spastic hemiplegia affecting unspecified side: Secondary | ICD-10-CM

## 2011-06-03 DIAGNOSIS — Z5189 Encounter for other specified aftercare: Secondary | ICD-10-CM

## 2011-06-03 DIAGNOSIS — I6330 Cerebral infarction due to thrombosis of unspecified cerebral artery: Secondary | ICD-10-CM

## 2011-06-03 LAB — GLUCOSE, CAPILLARY
Glucose-Capillary: 164 mg/dL — ABNORMAL HIGH (ref 70–99)
Glucose-Capillary: 173 mg/dL — ABNORMAL HIGH (ref 70–99)

## 2011-06-03 NOTE — Progress Notes (Signed)
Physical Therapy Session Note  Patient Details  Name: Tabetha Haraway MRN: [id number] Date of Birth: [birth year]  Today's Date: 06/03/2011 Time: 1102-1159 Time Calculation (min): 57 min  Short Term Goals: See d/c-LTG's  Skilled Therapeutic Interventions/Progress Updates:   Family education with husband who returned demonstrated bumping w/c up/down curb and 3 steps, also guarding and instructing pt with household mobility with gait  including stepping backwards and sidestepping with RW. Husband reports he completed all other ed with other PT. Pt stating she did not want a one arm drive w/c so [employer] rep was called and informed and he is to deliver regular chair approx 19 inches tall for her to propel with hemitechinique.even thought it is a bit to tall, this is her request and preference as she has too much difficulty getting out of chair with lower seat to floor height.  Stair training to increase control ans use of LLE.  Pt to go on community outing this afternoon, added communtiy w/c goal.  Therapy Documentation Precautions:  Precautions Precautions: Fall Precaution Comments: HOH Required Braces or Orthoses: Yes Other Brace/Splint: Rt AFO Restrictions Weight Bearing Restrictions: No Other Position/Activity Restrictions: right hemiplegia, decreased right side sensation, Dys. 3 with thin liquids      Pain: Pain Assessment Pain Assessment: No/denies pain Mobility: Bed Mobility Rolling Right: 6: Modified independent (Device/Increase time) Rolling Left: 6: Modified independent (Device/Increase time) Right Sidelying to Sit: 6: Modified independent (Device/Increase time) Sitting - Scoot to Edge of Bed: 5: Supervision;6: Modified independent (Device/Increase time) Transfers Sit to Stand: 5: Supervision Sit to Stand Details (indicate cue type and reason): RLE IR at hip, decreased weight bearing Stand to Sit: 5: Supervision;With armrests;With upper extremity assist (with RW) Stand  Pivot Transfers: 5: Supervision Squat Pivot Transfers: 5: Supervision Locomotion : Ambulation Ambulation: Yes Ambulation/Gait Assistance: 4: Min assist Ambulation Distance (Feet): 100 Feet Assistive device: Rolling walker Gait Gait: Yes Gait Pattern: Step-through pattern (R hip IR so toe in) Gait velocity: .5 ft per sec Stairs / Additional Locomotion Stairs: Yes Stairs Assistance: 4: Min assist Stairs Assistance Details (indicate cue type and reason): 15 steps step to pattern with increased time and effort to place RLE in nonadducted position  :  See FIM for current functional status  Therapy/Group: Individual Therapy  Michaelene Song 06/03/2011, 12:20 PM

## 2011-06-03 NOTE — Progress Notes (Signed)
Occupational Therapy Discharge Summary  Patient Details  Name: Chelsey Gallagher MRN: [id number] Date of Birth: 1950/03/25  Today's Date: 06/03/2011  Patient has met 10 of 12 long term goals due to improved activity tolerance, improved balance, postural control, ability to compensate for deficits, improved attention and improved awareness.  Patient to discharge at [facility] Assist level with self-cares.  Patient's care partner is independent to provide the necessary physical assistance at discharge.  Pt's caregiver, husband Chelsey Gallagher, has demonstrated understanding with providing physical assistance with toilet and tub/shower transfers from w/c and ambulating with RW.    Reasons goals not met: Pt continues to require min assist with bathing and UB dressing secondary to decreased function in dominant Rt upper extremity.  Pt requires assist with hooking bra (but only wears bra when going out in the community) and requires min assist with bathing secondary to requiring min/steady assist in standing to wash buttocks and requires assist to wash RUE.    Recommendation:  Patient will benefit from ongoing skilled OT services in home health setting to continue to advance functional skills in the area of BADL and iADL.  Equipment: tub bench  Reasons for discharge: treatment goals met and discharge from hospital  Patient/family agrees with progress made and goals achieved: Yes  OT Discharge Precautions/Restrictions  Precautions Precautions: Fall Required Braces or Orthoses: Yes Other Brace/Splint: Rt AFO Restrictions Weight Bearing Restrictions: No Pain Pain Assessment Pain Assessment: No/denies pain ADL ADL Grooming: Minimal assistance Where Assessed-Grooming: Sitting at sink;Standing at sink Upper Body Bathing: Minimal assistance Where Assessed-Upper Body Bathing: Shower Lower Body Bathing: Minimal assistance Where Assessed-Lower Body Bathing: Shower Upper Body Dressing: Minimal  assistance Where Assessed-Upper Body Dressing: Sitting at sink Lower Body Dressing: Minimal assistance Where Assessed-Lower Body Dressing: Sitting at sink;Standing at sink Toileting: Minimal assistance Where Assessed-Toileting: Teacher, adult education: Curator Method: Surveyor, minerals: Engineer, technical sales: International aid/development worker Method: Engineer, technical sales: Insurance underwriter: Insurance underwriter Method: Warden/ranger: Shower seat with back ADL Comments: Pt will use tub bench in tub/shower at home for bathing to increase safety and decrease assist level with transfers Vision/Perception  Vision - History Baseline Vision: Wears glasses all the time (for reading and distances) Patient Visual Report: No change from baseline Vision - Assessment Eye Alignment: Within Functional Limits Visual Fields: No apparent deficits Additional Comments: Nystagmus improving, pt with no c/o double vision Perception Perception: Within Functional Limits Praxis Praxis: Intact  Cognition Overall Cognitive Status: Appears within functional limits for tasks assessed Arousal/Alertness: Awake/alert Orientation Level: Oriented X4 Attention: Alternating Alternating Attention: Appears intact Memory Impairment: Decreased recall of new information Awareness: Appears intact Problem Solving: Appears intact Executive Function: Self Monitoring;Self Correcting Safety/Judgment: Appears intact Sensation Sensation Light Touch: Impaired Detail Light Touch Impaired Details: Impaired RUE;Impaired RLE Stereognosis: Not tested Hot/Cold: Not tested Proprioception: Impaired Detail Proprioception Impaired Details: Impaired RUE;Impaired RLE Coordination Gross Motor Movements are Fluid and Coordinated: No Fine Motor Movements are Fluid and Coordinated:  No Coordination and Movement Description: RUE still flaccid with no purposeful AROM Finger Nose Finger Test: unable to assess due to no AROM on RUE Extremity/Trunk Assessment RUE Assessment RUE Assessment: Exceptions to [facility] RUE AROM (degrees) Overall AROM Right Upper Extremity: Deficits (No AROM, slight shoulder ROM in sidelying and supine) RUE Strength RUE Overall Strength Comments: 0/5 RUE Tone RUE Tone Comments: flaccid,  LUE Assessment LUE Assessment: Within Functional Limits  See FIM for current functional status  Chelsey Gallagher 06/03/2011, 11:59 AM

## 2011-06-03 NOTE — Progress Notes (Signed)
Orthopedic Tech Progress Note Patient Details:  Chelsey Gallagher 10-17-1950 [id number]  Other Ortho Devices Ortho Device Interventions: Application Placed abdominal binder in pt room.  Leo Grosser T 06/03/2011, 4:25 PM

## 2011-06-03 NOTE — Progress Notes (Signed)
Recreational Therapy Session Note  Patient Details  Name: Anberlyn Feimster MRN: [id number] Date of Birth: 1950/07/07 Today's Date: 06/03/2011 Time: 1330-1530 Pain:  No c/o Skilled Therapeutic Interventions/Progress Updates: Community reintegration/outing to [employer] w/c level with supervision except on uneven surfaces in which pt required Min assist.  Pt stood at counter with Min assist. Discussion with pt about identification and negotiation of obstacles and energy conservation techniques.  Pt able to problem solve how to access public restroom independently.    Therapy/Group: Community Reintegration  Activity Level: Moderate:  Level of assist: Min Assist  Tula Schryver 06/03/2011, 4:18 PM

## 2011-06-03 NOTE — Progress Notes (Signed)
Occupational Therapy Session Note  Patient Details  Name: Chelsey Gallagher MRN: [id number] Date of Birth: 12-07-1950  Today's Date: 06/03/2011 Time: 1478-2956 and 1330-1530 Time Calculation (min): 60 min and 120 min  Short Term Goals: Week 3:  OT Short Term Goal 1 (Week 3): STG = LTGs due to remaining LOS  Skilled Therapeutic Interventions/Progress Updates:    1) Pt seen for ADL retraining with focus on increased independence with LB dressing with steady assist in standing and pt completing clothing management.  Hands on education with caregiver, ex-husband [name], with tub bench transfer into tub/shower with stand pivot from w/c and ambulating with RW and toilet transfer with BSC over toilet to provide grab bars to assist with sit to stand.  Educated pt and caregiver on proper transfer technique to increase safety and decrease caregiver burden and education with pt on when/where to cue caregiver on how to assist her.  Pt able to direct her care and provide [name] with verbal cues for hand placement and when and where to provide assistance.  Pt and caregiver demonstrated safety with tub/shower and toilet transfer from RW, in case w/c won't fit through bathroom door, and w/c.  2) Pt engaged in community outing to [employer] with therapeutic recreation and another pt.  Focus on propelling w/c up/down sidewalk rank, transfer from w/c to [place] seat, propel >300 feet negotiating crowds and narrow aisles, use of RUE as stabilizer to obtain and carry items, and problem solve barriers in community.  Pt able to identify barriers in community and problem solve ways to overcome those barriers.  Pt able to problem solve accessibility of handicap stall in bathroom and maneuver w/c for transfers and getting in/out of stall independently.  Engaged in standing at counter with [employer] with min/steady assist.  Post discussion pt able to identify energy conservation strategies to put in place when going out in  community.   Therapy Documentation Precautions:  Precautions Precautions: Fall Required Braces or Orthoses: Yes Other Brace/Splint: Rt AFO Restrictions Weight Bearing Restrictions: No Other Position/Activity Restrictions: right hemiplegia, decreased right side sensation, Dys. 3 with thin liquids General:   Vital Signs:   Pain: Pain Assessment Pain Assessment: No/denies pain ADL: ADL Grooming: Minimal assistance Where Assessed-Grooming: Sitting at sink;Standing at sink Upper Body Bathing: Minimal assistance Where Assessed-Upper Body Bathing: Shower Lower Body Bathing: Minimal assistance Where Assessed-Lower Body Bathing: Shower Upper Body Dressing: Minimal assistance Where Assessed-Upper Body Dressing: Sitting at sink Lower Body Dressing: Minimal assistance Where Assessed-Lower Body Dressing: Sitting at sink;Standing at sink Toileting: Minimal assistance Where Assessed-Toileting: Teacher, adult education: Curator Method: Surveyor, minerals: Engineer, technical sales: International aid/development worker Method: Engineer, technical sales: Insurance underwriter: Insurance underwriter Method: Warden/ranger: Shower seat with back ADL Comments: Pt will use tub bench in tub/shower at home for bathing to increase safety and decrease assist level with transfers  See FIM for current functional status  Therapy/Group: Individual Therapy  [name] 06/03/2011, 12:01 PM

## 2011-06-03 NOTE — Plan of Care (Signed)
Problem: RH Dressing Goal: LTG Patient will perform upper body dressing (OT) LTG Patient will perform upper body dressing with assist, with/without cues (OT).  Outcome: Adequate for Discharge Pt requires assist with hooking bra.  Pt reports that she rarely wears a bra, only when going out in the community.  So she will be supervision with UB dressing for around home, but requires min assist when going out in community

## 2011-06-03 NOTE — Progress Notes (Signed)
Patient ID: Chelsey Gallagher, female   DOB: Mar 03, 1951, 61 y.o.   MRN: [id number] Patient ID: Chelsey Gallagher, female   DOB: [birth year], 61 y.o.   MRN: [id number]   Subjective/Complaints: RLE spasms improved.  L jaw pain in area of biopsy improved.  Ultrasound is neg.  Bleeding lower aspect of old midline incision Review of Systems  Genitourinary: Positive for urgency and frequency. Negative for dysuria.  Neurological: Positive for focal weakness.  Psychiatric/Behavioral: The patient has insomnia.   All other systems reviewed and are negative.    Objective: Vital Signs: Blood pressure 149/72, pulse 79, temperature 97.4 F (36.3 C), temperature source Oral, resp. rate 17, height 5\' 3"  (1.6 m), weight 67.3 kg (148 lb 5.9 oz), SpO2 91.00%. No results found. Results for orders placed during the hospital encounter of 05/14/11 (from the past 72 hour(s))  GLUCOSE, CAPILLARY     Status: Abnormal   Collection Time   05/31/11  8:00 AM      Component Value Range Comment   Glucose-Capillary 129 (*) 70 - 99 (mg/dL)    Comment 1 Notify RN     GLUCOSE, CAPILLARY     Status: Abnormal   Collection Time   05/31/11 11:02 AM      Component Value Range Comment   Glucose-Capillary 104 (*) 70 - 99 (mg/dL)   GLUCOSE, CAPILLARY     Status: Abnormal   Collection Time   05/31/11  4:18 PM      Component Value Range Comment   Glucose-Capillary 124 (*) 70 - 99 (mg/dL)    Comment 1 Notify RN     GLUCOSE, CAPILLARY     Status: Abnormal   Collection Time   05/31/11  8:44 PM      Component Value Range Comment   Glucose-Capillary 106 (*) 70 - 99 (mg/dL)    Comment 1 Notify RN     GLUCOSE, CAPILLARY     Status: Abnormal   Collection Time   06/01/11  7:31 AM      Component Value Range Comment   Glucose-Capillary 106 (*) 70 - 99 (mg/dL)   GLUCOSE, CAPILLARY     Status: Abnormal   Collection Time   06/01/11 11:27 AM      Component Value Range Comment   Glucose-Capillary 137 (*) 70 - 99 (mg/dL)   GLUCOSE,  CAPILLARY     Status: Abnormal   Collection Time   06/01/11  4:18 PM      Component Value Range Comment   Glucose-Capillary 120 (*) 70 - 99 (mg/dL)   GLUCOSE, CAPILLARY     Status: Abnormal   Collection Time   06/01/11  8:56 PM      Component Value Range Comment   Glucose-Capillary 141 (*) 70 - 99 (mg/dL)    Comment 1 Notify RN     GLUCOSE, CAPILLARY     Status: Abnormal   Collection Time   06/02/11  7:21 AM      Component Value Range Comment   Glucose-Capillary 145 (*) 70 - 99 (mg/dL)    Comment 1 Notify RN     GLUCOSE, CAPILLARY     Status: Abnormal   Collection Time   06/02/11 11:31 AM      Component Value Range Comment   Glucose-Capillary 176 (*) 70 - 99 (mg/dL)    Comment 1 Notify RN     GLUCOSE, CAPILLARY     Status: Abnormal   Collection Time   06/02/11  4:19 PM  Component Value Range Comment   Glucose-Capillary 141 (*) 70 - 99 (mg/dL)    Comment 1 Notify RN     GLUCOSE, CAPILLARY     Status: Abnormal   Collection Time   06/02/11  9:17 PM      Component Value Range Comment   Glucose-Capillary 129 (*) 70 - 99 (mg/dL)    Comment 1 Notify RN      hysical Exam  Vitals reviewed.  Constitutional: She is oriented to person, place, and time. She appears well-developed.  HENT: Throat clr no thrush Head: Normocephalic. PERRL, EOMI  Neck: Normal range of motion. Neck with L submandibular tenderness no erythema. No thyromegaly present.  Cardiovascular: Normal rate and regular rhythm.  Pulmonary/Chest: Effort normal and breath sounds normal. She has no wheezes.  Abdominal: Soft. Positive distention and wound in lower mid abdomen Musculoskeletal: She exhibits no edema.  Neurological: She is alert and oriented to person, place, and time. Increased tone right She is somewhat hard of hearing (Right more affected than left). She wears hearing aids on either ear. Shows good awareness of deficits. Follow three-step commands.Severely Dysarthric speech. Gaze intact. Poor visual acuity  overall. Right facial weakness and lower facial sensory loss with tongue deviation.  RUE 0'/5. RLE 2+ prox to 0/trace distally with ADF and 1+/5 APF. Left side motor and sensory nromal. . She can sense pain however.  Skin: Skin is warm and dry.  Psychiatric: She has a normal mood and affect.  Mild anxiety  Assessment/Plan: 1. 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 Physiatrist is providing close team supervision and 24 hour management of active medical problems listed below. Physiatrist and rehab team continue to assess barriers to discharge/monitor patient progress toward functional and medical goals. FIM: FIM - Bathing Bathing Steps Patient Completed: Chest;Right Arm;Abdomen;Front perineal area;Buttocks;Right upper leg;Left upper leg;Right lower leg (including foot);Left lower leg (including foot) Bathing: 4: Min-Patient completes 8-9 10f 10 parts or 75+ percent  FIM - Upper Body Dressing/Undressing Upper body dressing/undressing steps patient completed: Thread/unthread right sleeve of pullover shirt/dresss;Thread/unthread left sleeve of pullover shirt/dress;Put head through opening of pull over shirt/dress;Pull shirt over trunk Upper body dressing/undressing: 5: Supervision: Safety issues/verbal cues FIM - Lower Body Dressing/Undressing Lower body dressing/undressing steps patient completed: Thread/unthread right underwear leg;Thread/unthread left underwear leg;Pull underwear up/down;Thread/unthread right pants leg;Thread/unthread left pants leg;Pull pants up/down;Don/Doff right sock;Don/Doff left sock;Don/Doff right shoe;Don/Doff left shoe Lower body dressing/undressing: 4: Steadying Assist  FIM - Toileting Toileting steps completed by patient: Adjust clothing prior to toileting;Performs perineal hygiene;Adjust clothing after toileting Toileting Assistive  Devices: Grab bar or rail for support Toileting: 4: Steadying assist  FIM - Diplomatic Services operational officer Devices: Grab bars Toilet Transfers: 4-To toilet/BSC: Min A (steadying Pt. > 75%);4-From toilet/BSC: Min A (steadying Pt. > 75%)  FIM - Bed/Chair Transfer Bed/Chair Transfer Assistive Devices: HOB elevated Bed/Chair Transfer: 3: Bed > Chair or W/C: Mod A (lift or lower assist);3: Chair or W/C > Bed: Mod A (lift or lower assist)  FIM - Locomotion: Wheelchair Distance: 200 Locomotion: Wheelchair: 5: Travels 150 ft or more: maneuvers on rugs and over door sills with supervision, cueing or coaxing FIM - Locomotion: Ambulation Locomotion: Ambulation Assistive Devices: Walker - Rolling;Orthosis Ambulation/Gait Assistance: 3: Mod assist Locomotion: Ambulation: 1: Travels less than 50 ft with moderate assistance (Pt: 50 - 74%)  Comprehension Comprehension Mode: Auditory Comprehension: 6-Follows complex conversation/direction:  With extra time/assistive device  Expression Expression Mode: Verbal Expression: 6-Expresses complex ideas: With extra time/assistive device  Social Interaction Social Interaction: 7-Interacts appropriately with others - No medications needed.  Problem Solving Problem Solving: 5-Solves complex 90% of the time/cues < 10% of the time  Memory Memory: 5-Recognizes or recalls 90% of the time/requires cueing < 10% of the time   Medical Problem List and Plan:  1. 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 DVT Prophylaxis/Anticoagulation: Subcutaneous lovenox. Monitor platelet counts and any signs of bleeding .   3. Left neck mandible mass benign. Cpain better 4. 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 Plan outpatient followup for revascularization with intervention radiology  5. Diabetes mellitus. Hemoglobin A1c of 10.2. Lantus insulin 15 units at bedtime. Check blood sugars a.c. and at bedtime. Glucophage. Fair to reasonable control  6.  Hypertension. Lisinopril 40 mg daily. Low systolic may be zanaflex related will reduce ACE I             7. Tobacco abuse. Nicoderm patch.  8. Restless leg syndrome.this is complicated by spasticity,  zanaflex 9. Depression.celexa. Provide emotional support and positive reinforcement  10. Hyperlipidemia. Lipitor 11.  Urinary freq  UMN bladder trial ditropan improved 12. Abdominal wound/hernia- continue allevyn, order corset. Surgery f/u? P   LOS (Days) 20 A FACE TO FACE EVALUATION WAS PERFORMED  Karryn Kosinski T 06/03/2011, 7:17 AM

## 2011-06-04 LAB — GLUCOSE, CAPILLARY: Glucose-Capillary: 111 mg/dL — ABNORMAL HIGH (ref 70–99)

## 2011-06-04 MED ORDER — NICOTINE 21 MG/24HR TD PT24: 21.0000 | MEDICATED_PATCH | Freq: Every day | TRANSDERMAL | Status: AC

## 2011-06-04 MED ORDER — TIZANIDINE HCL 4 MG PO TABS: 4.0000 mg | ORAL_TABLET | Freq: Every day | ORAL | Status: DC

## 2011-06-04 MED ORDER — ATORVASTATIN CALCIUM 40 MG PO TABS: 40.0000 mg | ORAL_TABLET | Freq: Every day | ORAL | Status: DC

## 2011-06-04 MED ORDER — LISINOPRIL 20 MG PO TABS
20.0000 mg | ORAL_TABLET | Freq: Every day | ORAL | Status: DC
  Filled 2011-06-04: qty 1

## 2011-06-04 MED ORDER — LISINOPRIL 20 MG PO TABS: 20.0000 mg | ORAL_TABLET | Freq: Every day | ORAL | Status: DC

## 2011-06-04 MED ORDER — INSULIN GLARGINE 100 UNIT/ML ~~LOC~~ SOLN: 15.0000 [IU] | Freq: Every day | SUBCUTANEOUS | Status: DC

## 2011-06-04 MED ORDER — METHOCARBAMOL 500 MG PO TABS: 500.0000 mg | ORAL_TABLET | Freq: Three times a day (TID) | ORAL | Status: AC | PRN

## 2011-06-04 MED ORDER — ASPIRIN 81 MG PO CHEW: 81.0000 mg | CHEWABLE_TABLET | Freq: Every day | ORAL | Status: DC

## 2011-06-04 MED ORDER — OXYBUTYNIN CHLORIDE 5 MG PO TABS: 2.5000 mg | ORAL_TABLET | Freq: Two times a day (BID) | ORAL | Status: DC

## 2011-06-04 MED ORDER — GABAPENTIN 400 MG PO CAPS: 800.0000 mg | ORAL_CAPSULE | Freq: Every day | ORAL | Status: DC

## 2011-06-04 MED ORDER — METFORMIN HCL 500 MG PO TABS: 500.0000 mg | ORAL_TABLET | Freq: Two times a day (BID) | ORAL | Status: DC

## 2011-06-04 NOTE — Discharge Instructions (Signed)
Inpatient Rehab Discharge Instructions  Chelsey Gallagher Discharge date and time: No discharge date for patient encounter.   Activities/Precautions/ Functional Status: Activity: activity as tolerated Diet: diabetic diet Wound Care: as directed Functional status:  ___ No restrictions     ___ Walk up steps independently __x_ 24/7 supervision/assistance   _x__ Walk up steps with assistance ___ Intermittent supervision/assistance  ___ Bathe/dress independently ___ Walk with walker     ___ Bathe/dress with assistance ___ Walk Independently    ___ Shower independently __x_ Walk with assistance    __x_ Shower with assistance ___ No alcohol     ___ Return to work/school ________  Special Instructions: ALLEVYN dressing to midline abdominal wound daily as needed with followup per home health nurse. Continue abdominal binder as needed Followup with [name] neurology [phone] in regards to right ICA stenosis and any plans for further intervention Followup with Dr. Biagio Quint of general surgery March 23 in regards to followup of hernia   COMMUNITY REFERRALS UPON DISCHARGE:    Home Health:   PT, OT, RN   Agency: [facility] Phone: [phone] Date of last service:06/04/2011  Medical Equipment/Items Ordered:UNITED SEATING-WHEELCHAIR [phone]  Agency/Supplier: [facility] [phone]   GENERAL COMMUNITY RESOURCES FOR PATIENT/FAMILY: Support Groups:[facility] SUPPORT GROUP  IF QUESTIONS OR CONCERNS ARISE, PLEASE CONTACT: Dossie Der, LCSW                                                                                                            [phone] My questions have been answered and I understand these instructions. I will adhere to these goals and the provided educational materials after my discharge from the hospital.  Patient/Caregiver Signature _______________________________ Date __________  Clinician Signature _______________________________________ Date __________  Please  bring this form and your medication list with you to all your follow-up doctor's appoi ntments.

## 2011-06-04 NOTE — Progress Notes (Signed)
Pt and daughter received verbal and written discharge instructions from [employer], [place]. Pt and family deny any questions or concerns in regards to medications and follow up appointments, diabetes management, nonhealing opening in abdominal incision from hernia repair, pt states" it just opens up at times, the doctor is aware." Alleyvn dressing intact. Pt has Right AFO and DME. Personal belongings packed by family,taken down by NT via wheelchair to private vehicle. [place], Tashianna Broome Elon Jester

## 2011-06-04 NOTE — Progress Notes (Signed)
Physical Therapy Discharge Summary  Patient Details  Name: Chelsey Gallagher MRN: [id number] Date of Birth: 05-May-1950  Today's Date: 06/04/2011 Time: 0900-1000 Time Calculation (min): 60 min  Patient has made excellent progress and has met 10 of 10 long term goals due to improved balance, improved postural control, increased strength, increased range of motion, ability to compensate for deficits, functional use of  right lower extremity, improved attention, improved awareness and improved coordination.  Patient to discharge at a wheelchair supervision level and Min Assist for transfers and ambulation short distances in/out of bathroom with RW secondary to w/c not fitting through doorway.   Patient's care partner (husband and daughter) is independent to provide the necessary physical assistance at discharge.  Reasons goals not met: All goals met  Recommendation:  Patient will benefit from ongoing skilled PT services in home health setting to continue to advance safe functional mobility, address ongoing impairments in R hemiplegia, increased tone, impaired motor control and timing, sequencing, impaired transfers, balance and gait, and minimize fall risk.  Equipment: R AFO, R night splint, R hand splint for RW, loaner w/c from [employer] and [employer]  Reasons for discharge: treatment goals met and discharge from hospital  Patient/family agrees with progress made and goals achieved: Yes  PT Discharge  Patient's daughter present for family education: educated daughter on how to stretch R ankle prior to donning night splint or AFO; discussed wearing schedule and how to inspect skin for pressure points; also demonstrated to daughter how to don abdominal binder to assist with hernia wound; patient's daughter gave repeat demonstration of donning splint.  Demonstrated to daughter how to perform safe stand pivot car transfer with UE support on car; daughter and patient gave repeat demonstration min  A; demonstrated to daughter how to perform w/c bumping up and down one step for home entry and exit; daughter have repeat demonstration supervision.  Patient received loaner w/c from w/c company: verbalized to daughter how to manage w/c parts and set up and break down w/c for storage and transfers; seating specialist to demonstrate and educate daughter on specifics of w/c.    Pain Pain Assessment Pain Assessment: No/denies pain Sensation Sensation Light Touch: Impaired by gross assessment Light Touch Impaired Details: Impaired RLE Coordination Gross Motor Movements are Fluid and Coordinated: No Heel Shin Test: Decreased ROM to complete full heel-shin test Motor  Motor Motor: Hemiplegia;Abnormal tone;Abnormal postural alignment and control;Motor impersistence, apraxia and ataxic gait Motor - Discharge Observations: R hemiplegia, increased tone persists but managed with stretching, night splint and AFO  Extremity Assessment  RLE Assessment RLE Assessment: Exceptions to [facility] RLE Strength Right Hip Flexion: 3+/5 Right Knee Flexion: 2-/5 Right Knee Extension: 4/5 Right Ankle Dorsiflexion: 2-/5 LLE Assessment LLE Assessment: Within Functional Limits  See FIM for current functional status  Edman Circle [facility] 06/04/2011, 10:28 AM

## 2011-06-04 NOTE — Progress Notes (Signed)
Patient ID: Chelsey Gallagher, female   DOB: [birth year], 61 y.o.   MRN: [id number] Patient ID: Chelsey Gallagher, female   DOB: 25-Dec-1950, 61 y.o.   MRN: [id number] Patient ID: Chelsey Gallagher, female   DOB: 04/05/1950, 61 y.o.   MRN: [id number]   Subjective/Complaints: RLE spasms improved.  L jaw pain in area of biopsy improved but still a little swelling.   Review of Systems  Genitourinary: Positive for urgency and frequency. Negative for dysuria.  Neurological: Positive for focal weakness.  Psychiatric/Behavioral: The patient has insomnia.   All other systems reviewed and are negative.    Objective: Vital Signs: Blood pressure 162/73, pulse 82, temperature 97.6 F (36.4 C), temperature source Oral, resp. rate 18, height 5\' 3"  (1.6 m), weight 67.3 kg (148 lb 5.9 oz), SpO2 98.00%. No results found. Results for orders placed during the hospital encounter of 05/14/11 (from the past 72 hour(s))  GLUCOSE, CAPILLARY     Status: Abnormal   Collection Time   06/01/11 11:27 AM      Component Value Range Comment   Glucose-Capillary 137 (*) 70 - 99 (mg/dL)   GLUCOSE, CAPILLARY     Status: Abnormal   Collection Time   06/01/11  4:18 PM      Component Value Range Comment   Glucose-Capillary 120 (*) 70 - 99 (mg/dL)   GLUCOSE, CAPILLARY     Status: Abnormal   Collection Time   06/01/11  8:56 PM      Component Value Range Comment   Glucose-Capillary 141 (*) 70 - 99 (mg/dL)    Comment 1 Notify RN     GLUCOSE, CAPILLARY     Status: Abnormal   Collection Time   06/02/11  7:21 AM      Component Value Range Comment   Glucose-Capillary 145 (*) 70 - 99 (mg/dL)    Comment 1 Notify RN     GLUCOSE, CAPILLARY     Status: Abnormal   Collection Time   06/02/11 11:31 AM      Component Value Range Comment   Glucose-Capillary 176 (*) 70 - 99 (mg/dL)    Comment 1 Notify RN     GLUCOSE, CAPILLARY     Status: Abnormal   Collection Time   06/02/11  4:19 PM      Component Value Range Comment   Glucose-Capillary 141 (*) 70 - 99 (mg/dL)    Comment 1 Notify RN     GLUCOSE, CAPILLARY     Status: Abnormal   Collection Time   06/02/11  9:17 PM      Component Value Range Comment   Glucose-Capillary 129 (*) 70 - 99 (mg/dL)    Comment 1 Notify RN     GLUCOSE, CAPILLARY     Status: Abnormal   Collection Time   06/03/11  7:12 AM      Component Value Range Comment   Glucose-Capillary 164 (*) 70 - 99 (mg/dL)   GLUCOSE, CAPILLARY     Status: Abnormal   Collection Time   06/03/11 12:01 PM      Component Value Range Comment   Glucose-Capillary 104 (*) 70 - 99 (mg/dL)    Comment 1 Notify RN     GLUCOSE, CAPILLARY     Status: Abnormal   Collection Time   06/03/11  4:44 PM      Component Value Range Comment   Glucose-Capillary 118 (*) 70 - 99 (mg/dL)    Comment 1 Notify RN     GLUCOSE,  CAPILLARY     Status: Abnormal   Collection Time   06/03/11  8:59 PM      Component Value Range Comment   Glucose-Capillary 173 (*) 70 - 99 (mg/dL)   GLUCOSE, CAPILLARY     Status: Abnormal   Collection Time   06/04/11  7:15 AM      Component Value Range Comment   Glucose-Capillary 111 (*) 70 - 99 (mg/dL)    hysical Exam  Vitals reviewed.  Constitutional: She is oriented to person, place, and time. She appears well-developed.  HENT: Throat clr no thrush Head: Normocephalic. PERRL, EOMI  Neck: Normal range of motion. Neck with L submandibular tenderness with decreased swelling. Slight tenderness and  no erythema. No thyromegaly present.  Cardiovascular: Normal rate and regular rhythm.  Pulmonary/Chest: Effort normal and breath sounds normal. She has no wheezes.  Abdominal: Soft. Positive distention and wound in lower mid abdomen Musculoskeletal: She exhibits no edema.  Neurological: She is alert and oriented to person, place, and time. Increased tone right She is somewhat hard of hearing (Right more affected than left). She wears hearing aids on either ear. Shows good awareness of deficits. Follow three-step  commands.Severely Dysarthric speech. Gaze intact. Poor visual acuity overall. Right facial weakness and lower facial sensory loss with tongue deviation.  RUE 0'/5. RLE 2+ prox to 0/trace distally with ADF and 1+/5 APF. Left side motor and sensory nromal. . She can sense pain however.  Skin: Skin is warm and dry.  Psychiatric: She has a normal mood and affect.  Mild anxiety  Assessment/Plan: 1. 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 Physiatrist is providing close team supervision and 24 hour management of active medical problems listed below. Physiatrist and rehab team continue to assess barriers to discharge/monitor patient progress toward functional and medical goals.  D/c home today  FIM: FIM - Bathing Bathing Steps Patient Completed: Chest;Right Arm;Abdomen;Front perineal area;Buttocks;Right upper leg;Left upper leg;Right lower leg (including foot);Left lower leg (including foot) Bathing: 4: Min-Patient completes 8-9 60f 10 parts or 75+ percent  FIM - Upper Body Dressing/Undressing Upper body dressing/undressing steps patient completed: Thread/unthread right bra strap;Thread/unthread left bra strap;Thread/unthread right sleeve of pullover shirt/dresss;Thread/unthread left sleeve of pullover shirt/dress;Put head through opening of pull over shirt/dress;Pull shirt over trunk Upper body dressing/undressing: 4: Min-Patient completed 75 plus % of tasks FIM - Lower Body Dressing/Undressing Lower body dressing/undressing steps patient completed: Thread/unthread right underwear leg;Thread/unthread left underwear leg;Pull underwear up/down;Thread/unthread right pants leg;Thread/unthread left pants leg;Pull pants up/down;Don/Doff right sock;Don/Doff left sock;Don/Doff right shoe;Don/Doff left shoe Lower body dressing/undressing: 4: Steadying Assist  FIM -  Toileting Toileting steps completed by patient: Adjust clothing prior to toileting;Performs perineal hygiene;Adjust clothing after toileting Toileting Assistive Devices: Grab bar or rail for support Toileting: 4: Steadying assist  FIM - Diplomatic Services operational officer Devices: Bedside commode (over toilet) Toilet Transfers: 4-To toilet/BSC: Min A (steadying Pt. > 75%);4-From toilet/BSC: Min A (steadying Pt. > 75%)  FIM - Bed/Chair Transfer Bed/Chair Transfer Assistive Devices: HOB elevated Bed/Chair Transfer: 6: Supine > Sit: No assist;6: Sit > Supine: No assist;5: Bed > Chair or W/C: Supervision (verbal cues/safety issues);5: Chair or W/C > Bed: Supervision (verbal cues/safety issues) (regular bed, squat pivot tx)  FIM - Locomotion: Wheelchair Distance: 200 Locomotion: Wheelchair: 6: Travels 150 ft or more, turns around, maneuvers to table, bed or toilet, negotiates 3% grade: maneuvers on rugs  and over door sills independently FIM - Locomotion: Ambulation Locomotion: Ambulation Assistive Devices: Walker - Rolling;Orthosis (hand splint) Ambulation/Gait Assistance: 4: Min assist Locomotion: Ambulation: 2: Travels 50 - 149 ft with minimal assistance (Pt.>75%) (100 and afo)  Comprehension Comprehension Mode: Auditory Comprehension: 6-Follows complex conversation/direction: With extra time/assistive device  Expression Expression Mode: Verbal Expression: 6-Expresses complex ideas: With extra time/assistive device  Social Interaction Social Interaction: 7-Interacts appropriately with others - No medications needed.  Problem Solving Problem Solving: 5-Solves complex 90% of the time/cues < 10% of the time  Memory Memory: 6-More than reasonable amt of time   Medical Problem List and Plan:  1. 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 DVT Prophylaxis/Anticoagulation: Subcutaneous lovenox. Monitor platelet counts and any signs of bleeding .   3. Left  neck mandible mass benign. Cpain better 4. 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 Plan outpatient followup for revascularization with intervention radiology  5. Diabetes mellitus. Hemoglobin A1c of 10.2. Lantus insulin 15 units at bedtime. Check blood sugars a.c. and at bedtime. Glucophage. Fair to reasonable control  6. Hypertension. Lisinopril at 10 mg daily.    Probably can increase back to 20mg           7. Tobacco abuse. Nicoderm patch.  8. Restless leg syndrome.this is complicated by spasticity,  zanaflex 9. Depression.celexa. Provide emotional support and positive reinforcement  10. Hyperlipidemia. Lipitor 11.  Urinary freq  UMN bladder trial ditropan improved 12. Abdominal wound/hernia- continue allevyn, order corset. Will set up with general surgery f/u to assess. P   LOS (Days) 21 A FACE TO FACE EVALUATION WAS PERFORMED  Tarren Velardi T 06/04/2011, 9:08 AM

## 2011-06-04 NOTE — Discharge Summary (Signed)
Chelsey Gallagher, Chelsey Gallagher               ACCOUNT NO.:  [id number]  MEDICAL RECORD NO.:  [id number]  LOCATION:  4031                         FACILITY:  [facility]  PHYSICIAN:  Erick Colace, M.D.DATE OF BIRTH:  1950-11-29  DATE OF ADMISSION:  05/14/2011 DATE OF DISCHARGE:  06/04/2011                              DISCHARGE SUMMARY   DISCHARGE DIAGNOSES: 1. Left medullary and cerebellar infarction. 2. Subcutaneous Lovenox for deep vein thrombosis prophylaxis. 3. Left neck mandible mass-biopsy negative right internal carotid     artery 76% stenosis. 4. Diabetes mellitus. 5. Hypertension. 6. History of abdominal hernia repair. 7. Tobacco abuse. 8. Restless legs syndrome. 9. Depression. 10.Hyperlipidemia.  HISTORY OF PRESENT ILLNESS:  This is a 61 year old right-handeded female with history of diabetes mellitus, tobacco abuse admitted May 08, 2011, with right-sided weakness and mild slurred speech.  MRI of the brain showed acute nonhemorrhagic left paracentral medulla infarction and small acute posterior left cerebellar infarction.  Echocardiogram with ejection fraction 70% and grade 1 diastolic dysfunction.  Carotid Doppler showed 60-79% internal carotid artery stenosis.  CTA of the head and neck showed normal origin of great vessels from aortic arch.  There was also noted posterior to the angle of the left mandible an area appearing 1.5 x 1.3 x 2.2 cm nodes suspicious for malignancy.  Proximal right internal carotid artery with irregular plaque ulceration 76% diameter stenosis.  CT of the head notes left vertebral artery to be occluded.  Four-vessel cerebral arteriogram May 12, 2011, by Intervention Radiology showed occluded left ICA proximal with a delayed string sign 65-70% stenosis right ICA proximal.  Dr. Jenne Pane of ENT followed up May 12, 2011, for questionable chronic noted left neck and mandible mass with biopsy before May 14, 2011, results pending.  Noted blood pressures  207/102, placed on intravenous labetalol.  The patient did receive tPA.  Neurology consulted, placed on aspirin therapy as well as subcutaneous Lovenox.  The patient maintained on a mechanical soft diet.  History of diabetes mellitus with hemoglobin A1c of 10.2 with sliding scale insulin as well as Lantus insulin.  The patient was admitted for comprehensive rehab program.  PAST MEDICAL HISTORY:  See discharge diagnoses.  ALLERGIES:  Codeine and Flexeril.  SOCIAL HISTORY:  Lives alone, 1 level home, 1 step to enter.  FUNCTIONAL HISTORY PRIOR TO ADMISSION:  Independent driving, retired.  FUNCTIONAL STATUS UPON ADMISSION TO REHAB SERVICES:  Supervision to scoot to the edge of the bed, +2 total assist sit to stand, +2 total assist stand pivot transfers, +2 total assist ambulate 4 feet with one- person handheld assistance.  PHYSICAL EXAMINATION:  VITAL SIGNS:  Blood pressure 137/88, pulse 76, respirations 17, temperature 97.6. GENERAL:  This was an alert female, in no acute distress, oriented x3, somewhat hard of hearing with hearing aids in place.  Shows good awareness of deficits.  Speech mildly dysarthric but fully intelligible. LUNGS:  Clear to auscultation. CARDIAC:  Regular rate and rhythm. ABDOMEN:  Soft, nontender.  Good bowel sounds with noted history of abdominal hernia repair.  REHABILITATION HOSPITAL COURSE:  The patient was admitted to Inpatient Rehab Services with therapies initiated on a 3 hour daily basis consisting of physical  therapy, occupational therapy, speech therapy, and rehabilitation nursing.  The following issues were addressed during the patient's rehabilitation stay.  Pertaining to Ms.  Brave's left medullary cerebellar infarction remain stable, maintained onn aspirin therapy.  Subcutaneous Lovenox for deep vein thrombosis prophylaxis. During her hospital course, she had undergone left neck mandible mass biopsy per ENT Dr. Jenne Pane with biopsy negative and  she would follow up on an outpatient basis.  Workup of stroke identifies right ICA artery stenosis of 76%.  Plan outpatient follow up for  revascularization with Interventional Radiology which could be addressed by Dr. Delia Heady as needed.  The patient did have a history of diabetes mellitus with hemoglobin A1c of 10.2.  She remained on insulin therapy as advised as well as Glucophage.  Her blood sugars were normalizing nicely.  Blood pressures monitored with lisinopril 10 mg daily and no orthostatic changes.  She did have a history of tobacco abuse.  She was on a NicoDerm patch.  I did discuss with her all the needs for cessation of tobacco products.  The patient with noted history of abdominal hernia repair 2 years ago in [place].  She could not recall the name of her physician but he has since retired.  During her acute rehab hospital stay noted small amount of drainage without odor, no fever at the midline of her abdominal incision repair years ago.  An abdominal binder was ordered for comfort as well as Allevyn dressing applied.  There was no drainage on the morning of discharge.  It was discussed with the patient the need for followup with General Surgery.  She was not sure she wants to followup in [place] with prior practice she had seen in the past which she could not recall the name.  It was recommended she follow up with [facility] here in [place] and appointment was to be made if the patient was accepting of this.  A Home Health nurse had been arranged for monitoring of this wound.  The patient received weekly collaborative interdisciplinary team conferences to discuss estimated length of stay, family teaching, and any barriers to her discharge.  She was tolerating a regular diet.  She was minimal assist for bathing, minimal assist upper body and lower body dressing, minimal assist stand pivot transfers, minimum to moderate assist toileting,  minimal assist for basic and car transfers, she was ambulating 75 feet moderate assist with a rolling walker, independent for her wheelchair mobility.  She exhibited no unsafe behavior.  She is to be discharged to home with her ex-husband who could provide 24 hour assistance as needed with ongoing home therapies as advised.  DISCHARGE MEDICATIONS:  At the time of dictation included Tylenol as needed, aspirin 81 mg daily, Lipitor 40 mg daily, Celexa 20 mg daily, Neurontin 800 mg at bedtime, Lantus insulin 15 units at bedtime, lisinopril 10 mg daily, Glucophage 500 mg twice daily, Robaxin 500 mg every 8 hours as needed muscle spasms, NicoDerm patch 21 mg taper as advised, Ditropan 2.5 mg twice daily, Senokot tablets 1 tab twice daily, Zanaflex 4 mg at bedtime.  DIET:  Diabetic diet.  SPECIAL INSTRUCTIONS:  Continue Allevyn dressing to abdominal wound, call if any increased redness, drainage or fever.  Arrangements were to be made with followup General Surgery as an outpatient for history of abdominal hernia repair.  The patient should follow up Dr. Delia Heady of Neurology Services in reference to right ICA stenosis for interventional services at his discretion for possible stenting.  The patient would follow up Dr. Claudette Laws at the outpatient rehab service office Jul 03, 2011. She should follow up with her primary MD, Dr. Tresea Mall of [address], [place] for ongoing medical care, Dr. Christia Reading in reference to mandible mass with biopsy negative.     Mariam Dollar, P.A.   ______________________________ Erick Colace, M.D.    DA/MEDQ  D:  06/04/2011  T:  06/04/2011  Job:  [id number]  cc:   Antony Contras, MD Tresea Mall Pramod P. Pearlean Brownie, MD [facility]

## 2011-06-04 NOTE — Plan of Care (Signed)
Problem: RH BOWEL ELIMINATION Goal: RH STG MANAGE BOWEL W/MEDICATION W/ASSISTANCE STG Manage Bowel with Medication with mod independence  Outcome: Completed/Met Date Met:  06/04/11 Scheduled senokot

## 2011-06-04 NOTE — Progress Notes (Signed)
Social Work Discharge Note Discharge Note  The overall goal for the admission was met for:   Discharge location: Yes-HOME WITH EX-HUSBAND PROVIDING CARE-24 HOUR  Length of Stay: Yes-21 DAYS  Discharge activity level: Yes-SUPERVISION-MIN LEVEL  Home/community participation: Yes  Services provided included: MD, RD, PT, OT, SLP, RN, CM, TR, Pharmacy and SW  Financial Services: Private Insurance: BLUE MEDICARE  Follow-up services arranged: Home Health: [facility]-PT,OT,RN, DME: [facility], Teaching laboratory technician and Patient/Family has no preference for HH/DME agencies  Comments (or additional information):  Patient/Family verbalized understanding of follow-up arrangements: Yes  Individual responsible for coordination of the follow-up plan: SELF AND EX-HUSBAND-ALLEN  Confirmed correct DME delivered: [name] 06/04/2011    [name]

## 2011-06-04 NOTE — Plan of Care (Signed)
Problem: RH KNOWLEDGE DEFICIT Goal: RH STG INCREASE KNOWLEDGE OF DIABETES Pt and caregiver will be able to verbalize medications used to manage Diabetes Pt and Caregiver will be able to verbalize S/S hypoglycemia and hyperglycemia Pt and Caregiver will be able to return demonstrate use of personal CBG machine Pt and Caregiver will be able to return demonstrate insulin administration of Lantus with pen as pt was using pen PTA..  Outcome: Completed/Met Date Met:  06/04/11 Pt and caregivers can verbalize and demonstrate use of CBG machine and use of insulin pen, medications, S/S of hypo/hyperglycemia. Denies any questions or concerns

## 2011-06-18 ENCOUNTER — Telehealth (HOSPITAL_COMMUNITY): Payer: Self-pay

## 2011-06-24 ENCOUNTER — Telehealth (INDEPENDENT_AMBULATORY_CARE_PROVIDER_SITE_OTHER): Payer: Self-pay

## 2011-06-24 ENCOUNTER — Ambulatory Visit (INDEPENDENT_AMBULATORY_CARE_PROVIDER_SITE_OTHER): Payer: Medicare Other | Admitting: General Surgery

## 2011-06-24 ENCOUNTER — Telehealth: Payer: Self-pay

## 2011-06-24 ENCOUNTER — Encounter (INDEPENDENT_AMBULATORY_CARE_PROVIDER_SITE_OTHER): Payer: Self-pay | Admitting: General Surgery

## 2011-06-24 VITALS — BP 152/68 | HR 80 | Temp 97.8°F | Resp 16 | Ht 63.0 in | Wt 141.0 lb

## 2011-06-24 DIAGNOSIS — K439 Ventral hernia without obstruction or gangrene: Secondary | ICD-10-CM

## 2011-06-24 NOTE — Telephone Encounter (Signed)
Chelsey Gallagher with [facility] wanted to inform us that during PT today at the pts home the pt did fall.  She is doing fine but just wanted to make [place] aware.

## 2011-06-24 NOTE — Telephone Encounter (Signed)
Called patient to offer earlier appointment for today w/Dr. Biagio Quint, patient not able to drive herself due to stroke and will keep her appointment time for 1:30.

## 2011-06-24 NOTE — Progress Notes (Signed)
Patient ID: Chelsey Gallagher, female   DOB: 1950-11-25, 61 y.o.   MRN: [id number]  Chief Complaint  Patient presents with  . Hernia    HPI Chelsey Gallagher is a 61 y.o. female. This patient was referred by the hospitalist service for evaluation of an incisional hernia. She had a perforated appendicitis in 2011 which required laparotomy and they used midline incision for treatment of this. She also has a right upper quadrant kocher incision for a prior open cholecystectomy and a transverse pelvic incision after a cesarean section. She says that since her surgery in 2011 she has noticed a bulge in the midline of her abdomen which has gradually increased in size. When she stands up this protrudes and does cause some mild discomfort. When she was in the hospital a month ago for a recent stroke, she did have some bleeding from her periumbilical region and ventral hernia was noticed.  She has no shortness symptoms although she says that she is more constipated than usual. Complicating the issue is her recent stroke and her right-sided hemiparesis. She also has a neck mass which is followed by Dr. Jenne Pane. She says that she had a biopsy already which was negative but he said he wanted to follow this. She also has a lung nodule which is not been evaluated. HPI  Past Medical History  Diagnosis Date  . Diabetes mellitus   . Hypertension   . Depression   . PAD (peripheral artery disease)     S/p bypass grafting, unclear exactly where  . Nephrolithiasis   . Hyperlipidemia 05/14/2011  . Stroke 05/2011    Weakness on left side of body. Wears leg brace.  Marland Kitchen Hearing loss     Past Surgical History  Procedure Date  . Total hip arthroplasty 2008    Right  . Abdominal hysterectomy 2003  . Cesarean section 1984  . Cholecystectomy 1984  . Bypass graft 2003  . Kidney stone surgery 1999  . Appendectomy 2011  . Hernia repair 2011    Family History  Problem Relation Age of Onset  . Cancer Maternal Aunt    breast    Social History History  Substance Use Topics  . Smoking status: Former Smoker -- 1.0 packs/day for 45 years    Types: Cigarettes  . Smokeless tobacco: Never Used  . Alcohol Use: No    Allergies  Allergen Reactions  . Codeine   . Flexeril (Cyclobenzaprine Hcl)     Current Outpatient Prescriptions  Medication Sig Dispense Refill  . glyBURIDE micronized (GLYNASE) 3 MG tablet       . methocarbamol (ROBAXIN) 500 MG tablet       . pravastatin (PRAVACHOL) 40 MG tablet       . tiZANidine (ZANAFLEX) 4 MG tablet       . aspirin 81 MG chewable tablet Chew 1 tablet (81 mg total) by mouth daily.      Marland Kitchen atorvastatin (LIPITOR) 40 MG tablet Take 1 tablet (40 mg total) by mouth daily at 6 PM.  30 tablet  1  . citalopram (CELEXA) 20 MG tablet Take 20 mg by mouth daily.      Marland Kitchen dicyclomine (BENTYL) 10 MG capsule Take 10 mg by mouth 4 (four) times daily -  before meals and at bedtime.      . gabapentin (NEURONTIN) 400 MG capsule Take 2 capsules (800 mg total) by mouth at bedtime.  60 capsule  1  . insulin glargine (LANTUS) 100 UNIT/ML injection Inject  15 Units into the skin at bedtime.  10 mL  2  . lisinopril (PRINIVIL,ZESTRIL) 20 MG tablet Take 1 tablet (20 mg total) by mouth daily.  30 tablet  1  . metFORMIN (GLUCOPHAGE) 500 MG tablet Take 1 tablet (500 mg total) by mouth 2 (two) times daily with a meal.  60 tablet  1  . nicotine (NICODERM CQ - DOSED IN MG/24 HOURS) 21 mg/24hr patch Place 21 patches onto the skin daily.  28 patch  1  . oxybutynin (DITROPAN) 5 MG tablet Take 0.5 tablets (2.5 mg total) by mouth 2 (two) times daily.  60 tablet  1    Review of Systems Review of Systems  Blood pressure 152/68, pulse 80, temperature 97.8 F (36.6 C), temperature source Temporal, resp. rate 16, height 5\' 3"  (1.6 m), weight 141 lb (63.957 kg).  Physical Exam Physical Exam  Vitals reviewed. Constitutional: She is oriented to person, place, and time. She appears well-developed and  well-nourished. No distress.  HENT:  Head: Normocephalic and atraumatic.  Eyes: Conjunctivae are normal. Pupils are equal, round, and reactive to light. Right eye exhibits no discharge. Left eye exhibits no discharge. No scleral icterus.  Neck: Normal range of motion. Neck supple. No tracheal deviation present.  Cardiovascular: Normal rate and regular rhythm.   Pulmonary/Chest: Effort normal and breath sounds normal.  Abdominal: Soft. Bowel sounds are normal. She exhibits no distension. There is no tenderness. There is no rebound.       She has a wide diastasis recti as well as a midline incisional hernia. The skin is then but healed.  Neurological: She is alert and oriented to person, place, and time.       She has a right-sided hemiparesis which is fairly obvious.  Skin: Skin is warm. No rash noted. She is not diaphoretic. No erythema. No pallor.  Psychiatric: She has a normal mood and affect. Her behavior is normal. Judgment and thought content normal.    Data Reviewed CT  Assessment    Incisional hernia She does have fairly decent sized incisional hernia but the skin appears healed over the incision and there is no evidence of structures or strangulation. She has multiple other issues such as a recent stroke and had questionable lung nodule and neck mass which are currently being evaluated. She quit smoking approximately one month ago at the time of her stroke. Though this will likely need repair, I recommended that all of these other issues be addressed prior to elective hernia repair. Certainly we would need clearance from her vascular surgeon and cardiologist prior to any procedure. Would also need clearance from her ENT to ensure that this neck mass is not malignant and workup of her lung nodule as well. I gave her my contact information and recommended that she call me back when we have some more information regarding his other issues so we can discuss scheduling her elective ventral  hernia repair. We did discuss the risk of strangulation and incarceration I think that the risk of this is lower than the risk of complications of surgery until we can get all these other issues addressed.    Plan    She will proceed with workup of her carotid artery disease in her lung nodule and neck mass and she will follow up with me when this is completed.       Lodema Pilot DAVID 06/24/2011, 2:11 PM

## 2011-07-03 ENCOUNTER — Inpatient Hospital Stay: Payer: Medicare Other | Admitting: Physical Medicine & Rehabilitation

## 2011-07-09 ENCOUNTER — Telehealth: Payer: Self-pay | Admitting: Physical Medicine & Rehabilitation

## 2011-07-09 DIAGNOSIS — I639 Cerebral infarction, unspecified: Secondary | ICD-10-CM

## 2011-07-09 NOTE — Telephone Encounter (Signed)
Okay to refer? 

## 2011-07-09 NOTE — Telephone Encounter (Signed)
[facility] d/c patient today, needs referral to OP OT.  Please call [place] [phone]

## 2011-07-10 NOTE — Telephone Encounter (Signed)
Pt was called and told that we were referring her to OT.

## 2011-07-10 NOTE — Telephone Encounter (Signed)
OK to refer.

## 2011-07-18 ENCOUNTER — Inpatient Hospital Stay: Payer: Medicare Other | Admitting: Physical Medicine & Rehabilitation

## 2011-07-25 ENCOUNTER — Ambulatory Visit (HOSPITAL_BASED_OUTPATIENT_CLINIC_OR_DEPARTMENT_OTHER): Payer: Medicare Other | Admitting: Physical Medicine & Rehabilitation

## 2011-07-25 ENCOUNTER — Encounter: Payer: Self-pay | Admitting: Physical Medicine & Rehabilitation

## 2011-07-25 ENCOUNTER — Encounter: Payer: Medicare Other | Attending: Physical Medicine & Rehabilitation

## 2011-07-25 VITALS — BP 131/82 | HR 82 | Ht 63.0 in | Wt 141.0 lb

## 2011-07-25 DIAGNOSIS — Z87891 Personal history of nicotine dependence: Secondary | ICD-10-CM | POA: Insufficient documentation

## 2011-07-25 DIAGNOSIS — I739 Peripheral vascular disease, unspecified: Secondary | ICD-10-CM | POA: Insufficient documentation

## 2011-07-25 DIAGNOSIS — G8110 Spastic hemiplegia affecting unspecified side: Secondary | ICD-10-CM | POA: Insufficient documentation

## 2011-07-25 DIAGNOSIS — G8190 Hemiplegia, unspecified affecting unspecified side: Secondary | ICD-10-CM | POA: Insufficient documentation

## 2011-07-25 DIAGNOSIS — E119 Type 2 diabetes mellitus without complications: Secondary | ICD-10-CM | POA: Insufficient documentation

## 2011-07-25 DIAGNOSIS — S8990XA Unspecified injury of unspecified lower leg, initial encounter: Secondary | ICD-10-CM | POA: Insufficient documentation

## 2011-07-25 DIAGNOSIS — I10 Essential (primary) hypertension: Secondary | ICD-10-CM | POA: Insufficient documentation

## 2011-07-25 DIAGNOSIS — E785 Hyperlipidemia, unspecified: Secondary | ICD-10-CM | POA: Insufficient documentation

## 2011-07-25 DIAGNOSIS — Z96649 Presence of unspecified artificial hip joint: Secondary | ICD-10-CM | POA: Insufficient documentation

## 2011-07-25 DIAGNOSIS — X500XXA Overexertion from strenuous movement or load, initial encounter: Secondary | ICD-10-CM | POA: Insufficient documentation

## 2011-07-25 NOTE — Patient Instructions (Addendum)
Your next visit will be for a Botox injection to help with the excessive flexion of your fingers of the right hand. In the future he may benefit from a right tibial nerve block to see if your ankle deformity can be corrected

## 2011-07-25 NOTE — Progress Notes (Signed)
Subjective:    Patient ID: Chelsey Gallagher, female    DOB: [birth year], 61 y.o.   MRN: [id number]  HPI This is a 61 year old right-handed female  with history of diabetes mellitus, tobacco abuse admitted May 08, 2011,  with right-sided weakness and mild slurred speech. MRI of the brain  showed acute nonhemorrhagic left paracentral medulla infarction and  small acute posterior left cerebellar infarction. Echocardiogram with  ejection fraction 70% and grade 1 diastolic dysfunction. Carotid  Doppler showed 60-79% internal carotid artery stenosis. CTA of the head  and neck showed normal origin of great vessels from aortic arch. There  was also noted posterior to the angle of the left mandible an area  appearing 1.5 x 1.3 x 2.2 cm nodes suspicious for malignancy. Proximal  right internal carotid artery with irregular plaque ulceration 76%  diameter stenosis. 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 Four-vessel cerebral arteriogram May 12, 2011, by  [facility] showed occluded left ICA proximal with a delayed  string sign 60-70% stenosis right ICA proximal.The patient  did receive tPA. Neurology consulted, placed on aspirin   Has finished up home health therapy. Would like to start outpatient therapy in [place]. Has increasing spasms in the finger flexors of the right hand. Also has inversion at the right ankle. She turned her ankle one time while she was not wearing her brace. I reminded her to wear her brace Pain Inventory Average Pain 4 Pain Right Now 4 My pain is intermittent and aching  In the last 24 hours, has pain interfered with the following? General activity 2 Relation with others 0 Enjoyment of life 0 What TIME of day is your pain at its worst? varies Sleep (in general) Poor  Pain is worse with: walking Pain improves with: rest Relief from Meds: 5  Mobility walk with assistance ability to climb steps?  no do you drive?  no use a  wheelchair  Function disabled: date disabled   Neuro/Psych numbness trouble walking  Prior Studies Any changes since last visit?  no  Physicians involved in your care Any changes since last visit?  no   Family History  Problem Relation Age of Onset  . Cancer Maternal Aunt     breast   History   Social History  . Marital Status: Legally Separated    Spouse Name: N/A    Number of Children: N/A  . Years of Education: N/A   Social History Main Topics  . Smoking status: Former Smoker -- 1.0 packs/day for 45 years    Types: Cigarettes  . Smokeless tobacco: Never Used  . Alcohol Use: No  . Drug Use: No  . Sexually Active: None   Other Topics Concern  . None   Social History Narrative   Lives by herself but still close to family. Not very active, does housework.    Past Surgical History  Procedure Date  . Total hip arthroplasty 2008    Right  . Abdominal hysterectomy 2003  . Cesarean section 1984  . Cholecystectomy 1984  . Bypass graft 2003  . Kidney stone surgery 1999  . Appendectomy 2011  . Hernia repair 2011   Past Medical History  Diagnosis Date  . Diabetes mellitus   . Hypertension   . Depression   . PAD (peripheral artery disease)     S/p bypass grafting, unclear exactly where  . Nephrolithiasis   . Hyperlipidemia 05/14/2011  . Stroke 05/2011    Weakness on  left side of body. Wears leg brace.  Marland Kitchen Hearing loss    BP 131/82  Pulse 82  Ht 5\' 3"  (1.6 m)  Wt 141 lb (63.957 kg)  BMI 24.98 kg/m2  SpO2 97%     Review of Systems  Neurological: Positive for numbness.  All other systems reviewed and are negative.       Objective:   Physical Exam  Constitutional: She is oriented to person, place, and time. She appears well-developed and well-nourished.  Musculoskeletal:       Right ankle: She exhibits decreased range of motion and deformity. She exhibits no swelling and no ecchymosis. tenderness. Lateral malleolus tenderness found.       Right  hand: She exhibits decreased range of motion and deformity. She exhibits no tenderness and no bony tenderness. decreased sensation noted.  Neurological: She is alert and oriented to person, place, and time. A sensory deficit is present. She exhibits abnormal muscle tone. Coordination and gait abnormal.  Reflex Scores:      Tricep reflexes are 3+ on the right side and 2+ on the left side.      Bicep reflexes are 3+ on the right side and 2+ on the left side.      Brachioradialis reflexes are 3+ on the right side and 2+ on the left side.      Patellar reflexes are 3+ on the right side and 2+ on the left side.      Achilles reflexes are 3+ on the right side and 2+ on the left side.      Decreased light touch in the right arm and right leg Ashworth 3 spasticity in the right finger flexors and wrist flexors          Assessment & Plan:  1. 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 2. Right ankle inversion injury mild related to not using AFO Recommendation since she has failed physical therapy and Zanaflex we'll schedule for Botox injection right upper extremity finger and wrist flexors. Discussed with patient agrees with plan. I'll see her back in about 2 weeks for this

## 2011-08-05 ENCOUNTER — Telehealth: Payer: Self-pay | Admitting: *Deleted

## 2011-08-05 NOTE — Telephone Encounter (Signed)
A fax was supposed to be sent to outpatient therapy in [place]. They state that they have not received. Please check on this.

## 2011-08-06 ENCOUNTER — Ambulatory Visit: Payer: Medicare Other | Attending: Physical Medicine & Rehabilitation | Admitting: Occupational Therapy

## 2011-08-06 DIAGNOSIS — M2420 Disorder of ligament, unspecified site: Secondary | ICD-10-CM | POA: Insufficient documentation

## 2011-08-06 DIAGNOSIS — M6281 Muscle weakness (generalized): Secondary | ICD-10-CM | POA: Insufficient documentation

## 2011-08-06 DIAGNOSIS — IMO0001 Reserved for inherently not codable concepts without codable children: Secondary | ICD-10-CM | POA: Insufficient documentation

## 2011-08-06 DIAGNOSIS — M25549 Pain in joints of unspecified hand: Secondary | ICD-10-CM | POA: Insufficient documentation

## 2011-08-06 DIAGNOSIS — M629 Disorder of muscle, unspecified: Secondary | ICD-10-CM | POA: Insufficient documentation

## 2011-08-07 ENCOUNTER — Encounter: Payer: Medicare Other | Admitting: Occupational Therapy

## 2011-08-11 ENCOUNTER — Ambulatory Visit: Payer: Medicare Other | Admitting: *Deleted

## 2011-08-13 ENCOUNTER — Ambulatory Visit: Payer: Medicare Other | Admitting: *Deleted

## 2011-08-18 ENCOUNTER — Encounter: Payer: Self-pay | Admitting: Physical Medicine & Rehabilitation

## 2011-08-18 ENCOUNTER — Encounter: Payer: Medicare Other | Attending: Physical Medicine & Rehabilitation

## 2011-08-18 ENCOUNTER — Other Ambulatory Visit: Payer: Self-pay | Admitting: *Deleted

## 2011-08-18 ENCOUNTER — Ambulatory Visit (HOSPITAL_BASED_OUTPATIENT_CLINIC_OR_DEPARTMENT_OTHER): Payer: Medicare Other | Admitting: Physical Medicine & Rehabilitation

## 2011-08-18 VITALS — BP 103/56 | HR 97 | Resp 14 | Ht 63.0 in | Wt 141.0 lb

## 2011-08-18 DIAGNOSIS — E119 Type 2 diabetes mellitus without complications: Secondary | ICD-10-CM | POA: Insufficient documentation

## 2011-08-18 DIAGNOSIS — I739 Peripheral vascular disease, unspecified: Secondary | ICD-10-CM | POA: Insufficient documentation

## 2011-08-18 DIAGNOSIS — E785 Hyperlipidemia, unspecified: Secondary | ICD-10-CM | POA: Insufficient documentation

## 2011-08-18 DIAGNOSIS — G8110 Spastic hemiplegia affecting unspecified side: Secondary | ICD-10-CM

## 2011-08-18 DIAGNOSIS — S8990XA Unspecified injury of unspecified lower leg, initial encounter: Secondary | ICD-10-CM | POA: Insufficient documentation

## 2011-08-18 DIAGNOSIS — I10 Essential (primary) hypertension: Secondary | ICD-10-CM | POA: Insufficient documentation

## 2011-08-18 DIAGNOSIS — Z96649 Presence of unspecified artificial hip joint: Secondary | ICD-10-CM | POA: Insufficient documentation

## 2011-08-18 DIAGNOSIS — G8190 Hemiplegia, unspecified affecting unspecified side: Secondary | ICD-10-CM | POA: Insufficient documentation

## 2011-08-18 DIAGNOSIS — X500XXA Overexertion from strenuous movement or load, initial encounter: Secondary | ICD-10-CM | POA: Insufficient documentation

## 2011-08-18 DIAGNOSIS — S99919A Unspecified injury of unspecified ankle, initial encounter: Secondary | ICD-10-CM | POA: Insufficient documentation

## 2011-08-18 DIAGNOSIS — Z87891 Personal history of nicotine dependence: Secondary | ICD-10-CM | POA: Insufficient documentation

## 2011-08-18 MED ORDER — TIZANIDINE HCL 4 MG PO TABS: 4.0000 mg | ORAL_TABLET | Freq: Every day | ORAL | Status: DC

## 2011-08-18 NOTE — Progress Notes (Signed)
Botox Injection for spasticity using needle EMG guidance  Dilution: 50 Units/ml Indication: Severe spasticity which interferes with ADL,mobility and/or  hygiene and is unresponsive to medication management and other conservative care Informed consent was obtained after describing risks and benefits of the procedure with the patient. This includes bleeding, bruising, infection, excessive weakness, or medication side effects. A REMS form is on file and signed. Needle: 27 gauge 1 inch  needle electrode Number of units per muscle Pectoralis0 Biceps0 FCR50 FCU0 FDS50 FDP50 FPL25 PL 25 Gastrosoleus0 Hamstrings0 All injections were done after obtaining appropriate EMG activity and after negative drawback for blood. The patient tolerated the procedure well. Post procedure instructions were given. A followup appointment was made.

## 2011-08-18 NOTE — Patient Instructions (Signed)
Botox Cosmetic Injections  Care After  Refer to this sheet in the next few weeks. These instructions provide you with information on caring for yourself after your procedure. Your caregiver may also give you more specific instructions. Your treatment has been planned according to current medical practices, but problems sometimes occur. Call your caregiver if you have any problems or questions after your procedure.  HOME CARE INSTRUCTIONS     Do not lie down for 4 hours after treatment.   Do not massage the treated muscles. This can cause the Botox to spread to the muscles around the eyes, which can lead to double vision.   Exercise the muscles every 15 minutes for 1 hour after treatment or as directed by your caregiver. Botox attaches better to active muscles.   You may put ice on the area where the shot was given.   Put ice in a plastic bag.   Place a towel between your skin and the bag.  SEEK IMMEDIATE MEDICAL CARE IF:     You develop double vision.   You start to have trouble swallowing.   You develop muscle weakness in other parts of your body.   Your pupils start to become dilated and sensitive to light.  MAKE SURE YOU:   Understand these instructions.   Will watch your condition.   Will get help right away if you are not doing well or get worse.  Document Released: 07/22/2010 Document Revised: 02/06/2011 Document Reviewed: 07/22/2010  Exit[employer] Patient Information 2012 [employer]

## 2011-08-18 NOTE — Progress Notes (Signed)
  Subjective:    Patient ID: Chelsey Gallagher, female    DOB: 08-May-1950, 61 y.o.   MRN: [id number]  HPI  Pain Inventory Average Pain 1 Pain Right Now 1 My pain is intermittent  In the last 24 hours, has pain interfered with the following? General activity 0 Relation with others 0 Enjoyment of life 0 What TIME of day is your pain at its worst? night Sleep (in general) Fair  Pain is worse with: n/a Pain improves with: n/a Relief from Meds: 0  Mobility walk with assistance use a walker do you drive?  no use a wheelchair transfers alone Do you have any goals in this area?  yes  Function not employed: date last employed  I need assistance with the following:  meal prep  Neuro/Psych numbness  Prior Studies Any changes since last visit?  no  Physicians involved in your care Any changes since last visit?  no   Family History  Problem Relation Age of Onset  . Cancer Maternal Aunt     breast   History   Social History  . Marital Status: Single    Spouse Name: N/A    Number of Children: N/A  . Years of Education: N/A   Social History Main Topics  . Smoking status: Former Smoker -- 1.0 packs/day for 45 years    Types: Cigarettes  . Smokeless tobacco: Never Used  . Alcohol Use: No  . Drug Use: No  . Sexually Active: None   Other Topics Concern  . None   Social History Narrative   Lives by herself but still close to family. Not very active, does housework.    Past Surgical History  Procedure Date  . Total hip arthroplasty 2008    Right  . Abdominal hysterectomy 2003  . Cesarean section 1984  . Cholecystectomy 1984  . Bypass graft 2003  . Kidney stone surgery 1999  . Appendectomy 2011  . Hernia repair 2011   Past Medical History  Diagnosis Date  . Diabetes mellitus   . Hypertension   . Depression   . PAD (peripheral artery disease)     S/p bypass grafting, unclear exactly where  . Nephrolithiasis   . Hyperlipidemia 05/14/2011  . Stroke 05/2011      Weakness on left side of body. Wears leg brace.  Marland Kitchen Hearing loss    BP 103/56  Pulse 97  Resp 14  Ht 5\' 3"  (1.6 m)  Wt 141 lb (63.957 kg)  BMI 24.98 kg/m2  SpO2 100%     Review of Systems  Neurological: Positive for numbness.  All other systems reviewed and are negative.       Objective:   Physical Exam        Assessment & Plan:

## 2011-08-20 ENCOUNTER — Telehealth: Payer: Self-pay | Admitting: Physical Medicine & Rehabilitation

## 2011-08-20 ENCOUNTER — Ambulatory Visit: Payer: Medicare Other | Admitting: Occupational Therapy

## 2011-08-20 DIAGNOSIS — G8110 Spastic hemiplegia affecting unspecified side: Secondary | ICD-10-CM

## 2011-08-20 NOTE — Telephone Encounter (Signed)
Patient wishes to change location of her therapy to [facility]  [address]   [place] [address]  [facility]) [phone]. Could you write another order allowing this to occur?

## 2011-08-20 NOTE — Telephone Encounter (Signed)
Would this be okay?

## 2011-08-21 NOTE — Telephone Encounter (Signed)
Ok will do.

## 2011-08-22 ENCOUNTER — Encounter: Payer: Medicare Other | Admitting: Occupational Therapy

## 2011-08-22 ENCOUNTER — Telehealth: Payer: Self-pay | Admitting: Physical Medicine & Rehabilitation

## 2011-08-22 NOTE — Telephone Encounter (Signed)
This was refilled today

## 2011-08-22 NOTE — Telephone Encounter (Signed)
Ann @ [facility] [phone]  patient out of  Tizanidine.

## 2011-08-25 ENCOUNTER — Encounter: Payer: Medicare Other | Admitting: Occupational Therapy

## 2011-08-25 ENCOUNTER — Ambulatory Visit: Payer: Medicare Other | Admitting: Occupational Therapy

## 2011-08-26 ENCOUNTER — Telehealth (HOSPITAL_COMMUNITY): Payer: Self-pay

## 2011-08-26 ENCOUNTER — Other Ambulatory Visit (HOSPITAL_COMMUNITY): Payer: Self-pay | Admitting: Interventional Radiology

## 2011-08-26 ENCOUNTER — Other Ambulatory Visit: Payer: Self-pay | Admitting: Neurology

## 2011-08-26 DIAGNOSIS — I771 Stricture of artery: Secondary | ICD-10-CM

## 2011-08-26 DIAGNOSIS — I6529 Occlusion and stenosis of unspecified carotid artery: Secondary | ICD-10-CM

## 2011-08-26 NOTE — Telephone Encounter (Signed)
Tried to call pts phone # to make her f/u apt with Dr. Corliss Skains but, it has been disconnected.  I called the contact number for Chelsey Gallagher and left a message for her apt Thursday.

## 2011-08-27 ENCOUNTER — Encounter: Payer: Medicare Other | Admitting: Occupational Therapy

## 2011-08-28 ENCOUNTER — Ambulatory Visit (HOSPITAL_COMMUNITY)
Admission: RE | Admit: 2011-08-28 | Discharge: 2011-08-28 | Disposition: A | Discharge status: Home / Self Care | Payer: Medicare Other | Source: Ambulatory Visit | Attending: Interventional Radiology | Admitting: Interventional Radiology

## 2011-08-28 DIAGNOSIS — I771 Stricture of artery: Secondary | ICD-10-CM

## 2011-08-29 ENCOUNTER — Encounter (HOSPITAL_COMMUNITY)
Admission: RE | Admit: 2011-08-29 | Discharge: 2011-08-29 | Disposition: A | Discharge status: Home / Self Care | Payer: Medicare Other | Source: Ambulatory Visit | Attending: Interventional Radiology | Admitting: Interventional Radiology

## 2011-08-29 ENCOUNTER — Encounter (HOSPITAL_COMMUNITY): Payer: Self-pay

## 2011-08-29 ENCOUNTER — Other Ambulatory Visit (HOSPITAL_COMMUNITY): Payer: Self-pay | Admitting: Interventional Radiology

## 2011-08-29 ENCOUNTER — Encounter: Payer: Medicare Other | Admitting: Occupational Therapy

## 2011-08-29 ENCOUNTER — Other Ambulatory Visit: Payer: Self-pay | Admitting: Radiology

## 2011-08-29 ENCOUNTER — Encounter (HOSPITAL_COMMUNITY): Payer: Self-pay | Admitting: Pharmacy Technician

## 2011-08-29 DIAGNOSIS — I6529 Occlusion and stenosis of unspecified carotid artery: Secondary | ICD-10-CM

## 2011-08-29 HISTORY — DX: Personal history of other diseases of the digestive system: Z87.19

## 2011-08-29 HISTORY — DX: Adverse effect of unspecified anesthetic, initial encounter: T41.45XA

## 2011-08-29 HISTORY — DX: Other complications of anesthesia, initial encounter: T88.59XA

## 2011-08-29 HISTORY — DX: Foot drop, left foot: M21.372

## 2011-08-29 HISTORY — DX: Orthostatic hypotension: I95.1

## 2011-08-29 HISTORY — DX: Cardiac murmur, unspecified: R01.1

## 2011-08-29 HISTORY — DX: Unspecified osteoarthritis, unspecified site: M19.90

## 2011-08-29 LAB — DIFFERENTIAL
Basophils Absolute: 0 10*3/uL (ref 0.0–0.1)
Eosinophils Relative: 3 % (ref 0–5)
Lymphocytes Relative: 30 % (ref 12–46)
Lymphs Abs: 2.5 10*3/uL (ref 0.7–4.0)
Neutro Abs: 5 10*3/uL (ref 1.7–7.7)

## 2011-08-29 LAB — COMPREHENSIVE METABOLIC PANEL
ALT: 24 U/L (ref 0–35)
AST: 20 U/L (ref 0–37)
Calcium: 10.5 mg/dL (ref 8.4–10.5)
Creatinine, Ser: 0.53 mg/dL (ref 0.50–1.10)
GFR calc Af Amer: >90 mL/min (ref >90)
Glucose, Bld: 125 mg/dL — ABNORMAL HIGH (ref 70–99)
Sodium: 137 mEq/L (ref 135–145)
Total Protein: 7.3 g/dL (ref 6.0–8.3)

## 2011-08-29 LAB — PROTIME-INR: Prothrombin Time: 13.4 seconds (ref 11.6–15.2)

## 2011-08-29 LAB — CBC
MCH: 31.6 pg (ref 26.0–34.0)
MCHC: 35.1 g/dL (ref 30.0–36.0)
MCV: 90.1 fL (ref 78.0–100.0)
Platelets: 167 10*3/uL (ref 150–400)

## 2011-08-29 NOTE — Pre-Procedure Instructions (Signed)
Chelsey Gallagher  08/29/2011   Your procedure is scheduled on:  Monday, July 1st.  Report to [facility] at 6:00 AM.  Call this number if you have problems the morning of surgery: [phone]   Remember:   Do not eat food or drink any liquid:After Midnight.       Take these medicines the morning of surgery with A SIP OF WATER: Citalorpam (Celexa), Tizanidine (Zanaflex).   Do not wear jewelry, make-up or nail polish.  Do not wear lotions, powders, or perfumes. You may wear deodorant.  Do not shave 48 hours prior to surgery. Men may shave face and neck.  Do not bring valuables to the hospital.  Contacts, dentures or bridgework may not be worn into surgery.  Leave suitcase in the car. After surgery it may be brought to your room.  For patients admitted to the hospital, checkout time is 11:00 AM the day of discharge.   Patients discharged the day of surgery will not be allowed to drive home.  Name and phone number of your driver: NA  Special Instructions: CHG Shower Use Special Wash: 1/2 bottle night before surgery and 1/2 bottle morning of surgery.   Please read over the following fact sheets that you were given: Pain Booklet, Coughing and Deep Breathing and Surgical Site Infection Prevention

## 2011-08-29 NOTE — Consult Note (Signed)
Anesthesia Chart Review:  Patient is a 61 year old female scheduled for cerebral arteriogram with possible angioplasty/stent for right carotid stenosis on 09/01/11 by Dr. Corliss Skains.    History includes DM2, HTN, recent former smoker, post-operative N/V, recent CVA with residual right hemiparesis, prior vascular procedure '03 which sounds like aorto-femoral bypass grating from her description.  Mild-moderate AS by 05/2011 echo.  She also describes anesthesia complication of hypotension.  She has never required re-intubation post-operatively.  Prior Anesthesia records were requested, but have not been received.  EKG on 05/08/11 showed SR, probable LVH with repolarization changes (inferolateral ST/Twave abnormality rather nonspecific).  Patient denies chest pain, SOB, syncope.  She denies history of a stress test.   Echo on 05/08/11 showed: - Left ventricle: The cavity size was normal. Wall thickness was increased in a pattern of moderate LVH. Systolic function was vigorous. The estimated ejection fraction was in the range of 65% to 70%. Mid-cavity gradient, peak 33 mmHg. Wall motion was normal; there were no regional wall motion abnormalities. Doppler parameters are consistent with abnormal left ventricular relaxation (grade 1 diastolic dysfunction). Septal thickness:61mm (ED, 2D). Posterior wall thickness: 12mm (ED, 2D). - Aortic valve: Trileaflet; severely calcified leaflets. Mild to moderate aortic stenosis. Mean gradient:20mm Hg (S). Peak gradient: 32mm Hg (S). Valve area: 1.36cm^2(VTI). - Mitral valve: Mildly calcified annulus. - Left atrium: The atrium was mildly dilated. - Right ventricle: The cavity size was normal. Systolic function was normal. - Pulmonary arteries: No complete TR doppler jet so unable to estimate PA systolic pressure. - Inferior vena cava: The vessel was normal in size; the respirophasic diameter changes were in the normal range (= 50%); findings are consistent with  normal central venous pressure. I discussed finding of mild to moderate AS with patient and need for future follow-up within 1-2 years or per her PCP recommendations.  She will ask that medical records send her PCP the report and follow-up their recommendations for follow-up.  Labs and CXR noted.  Reviewed with Anesthesiologist Dr. Chaney Malling.  Plan to proceed.  Shonna Chock, PA-C

## 2011-08-31 ENCOUNTER — Ambulatory Visit
Admission: RE | Admit: 2011-08-31 | Discharge: 2011-08-31 | Disposition: A | Discharge status: Home / Self Care | Payer: Medicare Other | Source: Ambulatory Visit | Attending: Neurology | Admitting: Neurology

## 2011-08-31 DIAGNOSIS — I6529 Occlusion and stenosis of unspecified carotid artery: Secondary | ICD-10-CM

## 2011-08-31 MED ORDER — GADOBENATE DIMEGLUMINE 529 MG/ML IV SOLN
10.0000 mL | Freq: Once | INTRAVENOUS | Status: AC | PRN
  Administered 2011-08-31: 10 mL via INTRAVENOUS

## 2011-09-01 ENCOUNTER — Encounter (HOSPITAL_COMMUNITY): Payer: Self-pay

## 2011-09-01 ENCOUNTER — Ambulatory Visit (HOSPITAL_COMMUNITY): Payer: Medicare Other | Admitting: Vascular Surgery

## 2011-09-01 ENCOUNTER — Inpatient Hospital Stay (HOSPITAL_COMMUNITY)
Admission: RE | Admit: 2011-09-01 | Discharge: 2011-09-03 | DRG: 027 | Disposition: A | Discharge status: Skilled Nursing Facility | Payer: Medicare Other | Source: Ambulatory Visit | Attending: Interventional Radiology | Admitting: Interventional Radiology

## 2011-09-01 ENCOUNTER — Encounter (HOSPITAL_COMMUNITY)
Admission: RE | Disposition: A | Discharge status: Skilled Nursing Facility | Payer: Self-pay | Source: Ambulatory Visit | Attending: Interventional Radiology

## 2011-09-01 ENCOUNTER — Encounter (HOSPITAL_COMMUNITY): Payer: Self-pay | Admitting: Vascular Surgery

## 2011-09-01 ENCOUNTER — Encounter (HOSPITAL_COMMUNITY): Payer: Self-pay | Admitting: *Deleted

## 2011-09-01 ENCOUNTER — Ambulatory Visit (HOSPITAL_COMMUNITY)
Admission: RE | Admit: 2011-09-01 | Discharge: 2011-09-01 | Disposition: A | Discharge status: Home / Self Care | Payer: Medicare Other | Source: Ambulatory Visit | Attending: Interventional Radiology | Admitting: Interventional Radiology

## 2011-09-01 DIAGNOSIS — E119 Type 2 diabetes mellitus without complications: Secondary | ICD-10-CM | POA: Diagnosis present

## 2011-09-01 DIAGNOSIS — I739 Peripheral vascular disease, unspecified: Secondary | ICD-10-CM | POA: Insufficient documentation

## 2011-09-01 DIAGNOSIS — F3289 Other specified depressive episodes: Secondary | ICD-10-CM | POA: Insufficient documentation

## 2011-09-01 DIAGNOSIS — Z01812 Encounter for preprocedural laboratory examination: Secondary | ICD-10-CM

## 2011-09-01 DIAGNOSIS — I6529 Occlusion and stenosis of unspecified carotid artery: Secondary | ICD-10-CM | POA: Insufficient documentation

## 2011-09-01 DIAGNOSIS — F329 Major depressive disorder, single episode, unspecified: Secondary | ICD-10-CM | POA: Insufficient documentation

## 2011-09-01 DIAGNOSIS — I10 Essential (primary) hypertension: Secondary | ICD-10-CM | POA: Insufficient documentation

## 2011-09-01 DIAGNOSIS — J984 Other disorders of lung: Secondary | ICD-10-CM

## 2011-09-01 DIAGNOSIS — K449 Diaphragmatic hernia without obstruction or gangrene: Secondary | ICD-10-CM | POA: Diagnosis present

## 2011-09-01 DIAGNOSIS — E785 Hyperlipidemia, unspecified: Secondary | ICD-10-CM | POA: Insufficient documentation

## 2011-09-01 DIAGNOSIS — Z720 Tobacco use: Secondary | ICD-10-CM

## 2011-09-01 DIAGNOSIS — R220 Localized swelling, mass and lump, head: Secondary | ICD-10-CM

## 2011-09-01 DIAGNOSIS — I69998 Other sequelae following unspecified cerebrovascular disease: Secondary | ICD-10-CM | POA: Insufficient documentation

## 2011-09-01 DIAGNOSIS — Z87891 Personal history of nicotine dependence: Secondary | ICD-10-CM

## 2011-09-01 DIAGNOSIS — R29898 Other symptoms and signs involving the musculoskeletal system: Secondary | ICD-10-CM | POA: Insufficient documentation

## 2011-09-01 DIAGNOSIS — I639 Cerebral infarction, unspecified: Secondary | ICD-10-CM

## 2011-09-01 DIAGNOSIS — G8110 Spastic hemiplegia affecting unspecified side: Secondary | ICD-10-CM

## 2011-09-01 DIAGNOSIS — M129 Arthropathy, unspecified: Secondary | ICD-10-CM | POA: Insufficient documentation

## 2011-09-01 LAB — POCT ACTIVATED CLOTTING TIME
Activated Clotting Time: 179 seconds
Activated Clotting Time: 194 seconds

## 2011-09-01 LAB — GLUCOSE, CAPILLARY: Glucose-Capillary: 95 mg/dL (ref 70–99)

## 2011-09-01 LAB — MRSA PCR SCREENING: MRSA by PCR: NEGATIVE

## 2011-09-01 LAB — HEPARIN LEVEL (UNFRACTIONATED): Heparin Unfractionated: <0.1 IU/mL — ABNORMAL LOW (ref 0.30–0.70)

## 2011-09-01 SURGERY — RADIOLOGY WITH ANESTHESIA
Anesthesia: General

## 2011-09-01 MED ORDER — HEPARIN (PORCINE) IN NACL 100-0.45 UNIT/ML-% IJ SOLN
500.0000 [IU]/h | INTRAMUSCULAR | Status: DC
  Administered 2011-09-01: 500 [IU]/h via INTRAVENOUS
  Filled 2011-09-01: qty 250

## 2011-09-01 MED ORDER — CLOPIDOGREL BISULFATE 75 MG PO TABS
75.0000 mg | ORAL_TABLET | Freq: Once | ORAL | Status: AC
  Administered 2011-09-01: 75 mg via ORAL
  Filled 2011-09-01: qty 1

## 2011-09-01 MED ORDER — SODIUM CHLORIDE 0.9 % IV SOLN
INTRAVENOUS | Status: DC | PRN
  Administered 2011-09-01: 09:00:00 via INTRAVENOUS

## 2011-09-01 MED ORDER — PHENYLEPHRINE HCL 10 MG/ML IJ SOLN
10.0000 mg | INTRAVENOUS | Status: DC | PRN
  Administered 2011-09-01: 20 ug/min via INTRAVENOUS

## 2011-09-01 MED ORDER — HYDRALAZINE HCL 20 MG/ML IJ SOLN
INTRAMUSCULAR | Status: DC | PRN
  Administered 2011-09-01: 5 mg via INTRAVENOUS

## 2011-09-01 MED ORDER — NICARDIPINE HCL IN NACL 20-0.86 MG/200ML-% IV SOLN
5.0000 mg/h | INTRAVENOUS | Status: DC
  Administered 2011-09-01: 5 mg/h via INTRAVENOUS
  Administered 2011-09-02: 2.5 mg/h via INTRAVENOUS
  Filled 2011-09-01 (×2): qty 200

## 2011-09-01 MED ORDER — HEPARIN SODIUM (PORCINE) 1000 UNIT/ML IJ SOLN
INTRAMUSCULAR | Status: DC | PRN
  Administered 2011-09-01: 500 [IU] via INTRAVENOUS
  Administered 2011-09-01: 3000 [IU] via INTRAVENOUS

## 2011-09-01 MED ORDER — SODIUM CHLORIDE 0.9 % IV SOLN: INTRAVENOUS | Status: AC

## 2011-09-01 MED ORDER — CEFAZOLIN SODIUM 1-5 GM-% IV SOLN
1.0000 g | Freq: Once | INTRAVENOUS | Status: AC
  Administered 2011-09-01: 1 g via INTRAVENOUS

## 2011-09-01 MED ORDER — HYDROMORPHONE HCL PF 1 MG/ML IJ SOLN: 0.2500 mg | INTRAMUSCULAR | Status: DC | PRN

## 2011-09-01 MED ORDER — HEPARIN (PORCINE) IN NACL 100-0.45 UNIT/ML-% IJ SOLN
600.0000 [IU]/h | INTRAMUSCULAR | Status: AC
  Filled 2011-09-01: qty 250

## 2011-09-01 MED ORDER — INSULIN ASPART 100 UNIT/ML ~~LOC~~ SOLN: 0.0000 [IU] | Freq: Three times a day (TID) | SUBCUTANEOUS | Status: DC

## 2011-09-01 MED ORDER — NIMODIPINE 30 MG PO CAPS: 60.0000 mg | ORAL_CAPSULE | ORAL | Status: DC

## 2011-09-01 MED ORDER — INSULIN ASPART 100 UNIT/ML ~~LOC~~ SOLN: 0.0000 [IU] | Freq: Every day | SUBCUTANEOUS | Status: DC

## 2011-09-01 MED ORDER — ONDANSETRON HCL 4 MG/2ML IJ SOLN: 4.0000 mg | Freq: Once | INTRAMUSCULAR | Status: DC | PRN

## 2011-09-01 MED ORDER — INSULIN ASPART 100 UNIT/ML ~~LOC~~ SOLN
0.0000 [IU] | Freq: Three times a day (TID) | SUBCUTANEOUS | Status: DC
  Administered 2011-09-03: 2 [IU] via SUBCUTANEOUS

## 2011-09-01 MED ORDER — NIMODIPINE 30 MG PO CAPS
ORAL_CAPSULE | ORAL | Status: AC
  Filled 2011-09-01: qty 2

## 2011-09-01 MED ORDER — NICARDIPINE HCL IN NACL 20-0.86 MG/200ML-% IV SOLN
INTRAVENOUS | Status: DC | PRN
  Administered 2011-09-01: 5 mg/h via INTRAVENOUS

## 2011-09-01 MED ORDER — SODIUM CHLORIDE 0.9 % IJ SOLN
1.5000 mg | INTRAVENOUS | Status: DC
  Filled 2011-09-01: qty 0.3

## 2011-09-01 MED ORDER — IOHEXOL 300 MG/ML  SOLN
150.0000 mL | Freq: Once | INTRAMUSCULAR | Status: AC | PRN
  Administered 2011-09-01: 72 mL via INTRA_ARTERIAL

## 2011-09-01 MED ORDER — ASPIRIN EC 325 MG PO TBEC
DELAYED_RELEASE_TABLET | ORAL | Status: AC
  Filled 2011-09-01: qty 1

## 2011-09-01 MED ORDER — SODIUM CHLORIDE 0.9 % IV SOLN
INTRAVENOUS | Status: DC
  Administered 2011-09-01 (×2): via INTRAVENOUS

## 2011-09-01 MED ORDER — CEFAZOLIN SODIUM 1-5 GM-% IV SOLN
INTRAVENOUS | Status: AC
  Filled 2011-09-01: qty 50

## 2011-09-01 MED ORDER — ACETAMINOPHEN 500 MG PO TABS
1000.0000 mg | ORAL_TABLET | Freq: Four times a day (QID) | ORAL | Status: DC | PRN
  Administered 2011-09-01 – 2011-09-02 (×3): 1000 mg via ORAL
  Filled 2011-09-01: qty 2
  Filled 2011-09-01: qty 1
  Filled 2011-09-01: qty 2

## 2011-09-01 MED ORDER — ASPIRIN 325 MG PO TABS
325.0000 mg | ORAL_TABLET | Freq: Every day | ORAL | Status: DC
  Administered 2011-09-02 – 2011-09-03 (×2): 325 mg via ORAL
  Filled 2011-09-01 (×3): qty 1

## 2011-09-01 MED ORDER — FENTANYL CITRATE 0.05 MG/ML IJ SOLN
INTRAMUSCULAR | Status: DC | PRN
  Administered 2011-09-01 (×2): 25 ug via INTRAVENOUS

## 2011-09-01 MED ORDER — ASPIRIN EC 325 MG PO TBEC
325.0000 mg | DELAYED_RELEASE_TABLET | Freq: Once | ORAL | Status: AC
  Administered 2011-09-01: 325 mg via ORAL

## 2011-09-01 MED ORDER — MIDAZOLAM HCL 5 MG/5ML IJ SOLN
INTRAMUSCULAR | Status: DC | PRN
  Administered 2011-09-01: 2 mg via INTRAVENOUS

## 2011-09-01 MED ORDER — CLOPIDOGREL BISULFATE 75 MG PO TABS
75.0000 mg | ORAL_TABLET | Freq: Every day | ORAL | Status: DC
  Administered 2011-09-02 – 2011-09-03 (×2): 75 mg via ORAL
  Filled 2011-09-01 (×3): qty 1

## 2011-09-01 MED ORDER — ONDANSETRON HCL 4 MG/2ML IJ SOLN: 4.0000 mg | Freq: Four times a day (QID) | INTRAMUSCULAR | Status: DC | PRN

## 2011-09-01 MED ORDER — PNEUMOCOCCAL VAC POLYVALENT 25 MCG/0.5ML IJ INJ
0.5000 mL | INJECTION | INTRAMUSCULAR | Status: AC
  Administered 2011-09-03: 0.5 mL via INTRAMUSCULAR
  Filled 2011-09-01 (×4): qty 0.5

## 2011-09-01 MED ORDER — ACETAMINOPHEN 650 MG RE SUPP: 650.0000 mg | Freq: Four times a day (QID) | RECTAL | Status: DC | PRN

## 2011-09-01 NOTE — Progress Notes (Signed)
  Subjective: Post procedure more awake,alert. C/O rt shoulder stiffness,and pain. Denies any new neurological symptoms.. No H/As,N/V diplopia.. No change  in RT sided weakness . Denies any chest pains or SOB  Objective: Vital signs in last 24 hours: Temp:  [97 F (36.1 C)-98.1 F (36.7 C)] 98.1 F (36.7 C) (07/01 1251) Pulse Rate:  [52-68] 56  (07/01 1400) Resp:  [12-20] 20  (07/01 1400) BP: (89-131)/(53-64) 101/56 mmHg (07/01 1400) SpO2:  [96 %-100 %] 98 % (07/01 1400) Arterial Line BP: (118-141)/(53-59) 137/56 mmHg (07/01 1400) Weight:  [141 lb 1.5 oz (64 kg)] 141 lb 1.5 oz (64 kg) (07/01 1251)    Intake/Output from previous day:   Intake/Output this shift: Total I/O In: 10.7 [I.V.:10.7] Out: 650 [Urine:650]  On Examination  VS  BP 130s/60s.HR 60s SR.PaO2 98 % 2ls O2  [place].  Neurologically Alert, awake oriented to time ,place and space.Marland Kitchen  Speech and comprehension clear..Dysarthric ,with occasional word finding difficulties.  PEARLA..3mm RT =Lt   EOMS full.. With unsustained nystagmus with rt lateral gaze.  Visual Fields.Full to confrontation.  No Facial asymmetry.Mild rt facial droop.  Tongue Midline..  Motor..           RT UE. 3/5 prox. 0-1/5 distally. RT LU 3/5 prox and distally.              Power.5/5 Proximally and distally all four extremities..  Fine motor and coordination to finger to nose  Normal on the left.  Gait  Not tested   Romberg Not tested.  Heel to toe. Not tested.  Lab Results:   [facility] 08/29/11 1513  WBC 8.2  HGB 13.7  HCT 39.0  PLT 167   BMET  Basename 08/29/11 1513  NA 137  K 3.9  CL 100  CO2 25  GLUCOSE 125*  BUN 18  CREATININE 0.53  CALCIUM 10.5   PT/INR  Basename 08/29/11 1513  LABPROT 13.4  INR 1.00   ABG No results found for this basename: PHART:2,PCO2:2,PO2:2,HCO3:2 in the last 72 hours  Studies/Results: Mr Shirlee Latch Wo Contrast  08/31/2011  This examination was performed at [facility]  Imaging at [facility]. The interpretation will be provided by [facility].  Original Report Authenticated By: Thomasenia Sales, M.D.   Mr Angiogram Neck W Wo Contrast  08/31/2011  This examination was performed at [facility] Imaging at [facility]. The interpretation will be provided by [facility].  Original Report Authenticated By: Thomasenia Sales, M.D.   Mr Laqueta Jean Wo Contrast  08/31/2011  This examination was performed at [facility] Imaging at [facility]. The interpretation will be provided by [facility].  Original Report Authenticated By: Thomasenia Sales, M.D.    Anti-infectives: Anti-infectives    None      Assessment/Plan: s/p  Rt CCA/ICA prox stent assisted angioplasty with distal protection for severe stenosis. Plan. Continue IV heparin ,with close watch on BP.and neurologic exam. Advance diet as tolerated to free liquids. D/W patient and family  Blondell Laperle K 09/01/2011

## 2011-09-01 NOTE — Progress Notes (Signed)
Heparin per Pharmacy Consult  Indication: post carotid stent  Heparin level = <0.1 on 500 units/hr Goal heparin level = 0.1-0.25  Heparin level was subtherapeutic. Will increase rate to 600 units/hr and check level with AM labs.  Cardell Peach, PharmD

## 2011-09-01 NOTE — Anesthesia Preprocedure Evaluation (Addendum)
Anesthesia Evaluation  Patient identified by MRN, date of birth, ID band Patient awake    Reviewed: Allergy & Precautions, H&P , NPO status , Patient's Chart, lab work & pertinent test results  History of Anesthesia Complications (+) PONV  Airway Mallampati: II  Neck ROM: full    Dental  (+) Teeth Intact and Dental Advisory Given   Pulmonary former smoker breath sounds clear to auscultation        Cardiovascular hypertension, Rhythm:Regular Rate:Normal     Neuro/Psych Depression  Neuromuscular disease CVA    GI/Hepatic hiatal hernia,   Endo/Other  Diabetes mellitus-  Renal/GU      Musculoskeletal  (+) Arthritis -,   Abdominal   Peds  Hematology   Anesthesia Other Findings   Reproductive/Obstetrics                          Anesthesia Physical Anesthesia Plan  ASA: III  Anesthesia Plan: General   Post-op Pain Management:    Induction: Intravenous  Airway Management Planned: Oral ETT  Additional Equipment: Arterial line  Intra-op Plan:   Post-operative Plan: Extubation in OR  Informed Consent: I have reviewed the patients History and Physical, chart, labs and discussed the procedure including the risks, benefits and alternatives for the proposed anesthesia with the patient or authorized representative who has indicated his/her understanding and acceptance.   Dental advisory given  Plan Discussed with: CRNA and Surgeon  Anesthesia Plan Comments: (S/P L. Brain CVA with carotid stenosis Type 2 DM 136 htn Carotid stenosis AVA 1.36  Plan GA with art line  Kipp Brood, MD)       Anesthesia Quick Evaluation

## 2011-09-01 NOTE — Progress Notes (Signed)
2 plus pulses in both feet

## 2011-09-01 NOTE — H&P (Signed)
Chelsey Gallagher is an 61 y.o. female.   Chief Complaint: Known Left Internal carotid artery occlusion; Right carotid artery stenosis. Pt with slurred speech; left facial droop; weak on rt side more than left Scheduled for Right internal carotid angioplasty/stent placement HPI: DM; HTN;PAD; PONV; CVA; remote smoker  Past Medical History  Diagnosis Date  . Diabetes mellitus   . Hypertension   . Depression   . PAD (peripheral artery disease)     S/p bypass grafting, unclear exactly where  . Nephrolithiasis   . Hyperlipidemia 05/14/2011  . Hearing loss   . Complication of anesthesia     doesn't wake up good- "usually end up in ICU"  . PONV (postoperative nausea and vomiting)   . Orthostatic hypotension     after surgery.  Marland Kitchen Heart murmur     PCP- [facility]- No tx needed.  . Stroke 05/2011    Weakness on Right. Wears leg brace.  . Foot drop, left   . H/O hiatal hernia     second one  . Neuromuscular disorder     Diabetic neuropathy- has inproved since she quit smoking.  . Arthritis     Past Surgical History  Procedure Date  . Total hip arthroplasty 2008    Right  . Abdominal hysterectomy 2003  . Cesarean section 1984  . Cholecystectomy 1984  . Bypass graft 2003    Abdominal aortic  . Kidney stone surgery 1999  . Appendectomy 2011  . Hernia repair 2011  . Lithotripsy   . Abdominal hysterectomy     Family History  Problem Relation Age of Onset  . Cancer Maternal Aunt     breast   Social History:  reports that she has quit smoking. Her smoking use included Cigarettes. She smoked 0 packs per day for 45 years. She quit smokeless tobacco use about 3 months ago. She reports that she does not drink alcohol or use illicit drugs.  Allergies:  Allergies  Allergen Reactions  . Codeine   . Flexeril (Cyclobenzaprine Hcl)   . Lyrica (Pregabalin) Other (See Comments)    "wires my up"     (Not in a hospital admission)  Results for orders placed during the  hospital encounter of 09/01/11 (from the past 48 hour(s))  GLUCOSE, CAPILLARY     Status: Abnormal   Collection Time   09/01/11  6:33 AM      Component Value Range Comment   Glucose-Capillary 136 (*) 70 - 99 mg/dL    Mr Maxine Glenn Head Wo Contrast  08/31/2011  This examination was performed at [facility] Imaging at [facility]. The interpretation will be provided by [facility].  Original Report Authenticated By: Thomasenia Sales, M.D.   Mr Angiogram Neck W Wo Contrast  08/31/2011  This examination was performed at [facility] Imaging at [facility]. The interpretation will be provided by [facility].  Original Report Authenticated By: Thomasenia Sales, M.D.   Mr Laqueta Jean Wo Contrast  08/31/2011  This examination was performed at [facility] Imaging at [facility] The interpretation will be provided by [facility].  Original Report Authenticated By: Thomasenia Sales, M.D.    Review of Systems  Constitutional: Negative for fever.  HENT: Positive for hearing loss.   Eyes: Positive for blurred vision.  Respiratory: Negative for cough.   Cardiovascular: Negative for chest pain.  Gastrointestinal: Negative for nausea and vomiting.  Neurological: Positive for dizziness, speech change, focal  weakness and headaches.       Slurred speech; rt side weak  Psychiatric/Behavioral: The patient is nervous/anxious.     There were no vitals taken for this visit. Physical Exam  Constitutional: She is oriented to person, place, and time. She appears well-developed and well-nourished.  HENT:  Head: Normocephalic.  Eyes: EOM are normal.  Neck: Normal range of motion.  Cardiovascular: Normal rate and regular rhythm.   Murmur heard. Respiratory: Effort normal and breath sounds normal.  GI: Soft. Bowel sounds are normal.  Musculoskeletal:       Right side weaker than left  Neurological: She is alert and oriented to  person, place, and time. Coordination abnormal.  Skin: Skin is warm and dry.  Psychiatric: She has a normal mood and affect. Her behavior is normal. Thought content normal.     Assessment/Plan Known L ICA occl; R ICA stenosis Rt side weaker than left Left facial droop Scheduled for cerebral arteriogram with possible Rt ICA angioplasty/stent placement Pt and dtr aware of procedure benefits and risks and agreeable to proceed. Pt understands if intervention performed she will spend night  In Neuro ICU; plan dc for next day Consent signed and in chart  Sarahanne Novakowski A 09/01/2011, 7:26 AM

## 2011-09-01 NOTE — Procedures (Signed)
S/P RT CCA/ICA stent assisted angioplasty with distal protection for symptomatic prox rt ICA stenosis.. Severe 3 vessel cerebrovascular disease

## 2011-09-01 NOTE — Progress Notes (Signed)
ANTICOAGULATION CONSULT NOTE - Initial Consult  Pharmacy Consult for Heparin Indication: post carotid stent  Allergies  Allergen Reactions  . Codeine   . Flexeril (Cyclobenzaprine Hcl)   . Lyrica (Pregabalin) Other (See Comments)    "wires my up"    Patient Measurements: Height: 5\' 3"  (160 cm) Weight: 141 lb 1.5 oz (64 kg) IBW/kg (Calculated) : 52.4  Heparin Dosing Weight: 64 kg  Vital Signs: Temp: 98.1 F (36.7 C) (07/01 1251) Temp src: Oral (07/01 1251) BP: 115/64 mmHg (07/01 1251) Pulse Rate: 63  (07/01 1251)  Labs:  Basename 08/29/11 1513  HGB 13.7  HCT 39.0  PLT 167  APTT 31  LABPROT 13.4  INR 1.00  HEPARINUNFRC --  CREATININE 0.53  CKTOTAL --  CKMB --  TROPONINI --    Estimated Creatinine Clearance: 68.1 ml/min (by C-G formula based on Cr of 0.53).   Medical History: Past Medical History  Diagnosis Date  . Diabetes mellitus   . Hypertension   . Depression   . PAD (peripheral artery disease)     S/p bypass grafting, unclear exactly where  . Nephrolithiasis   . Hyperlipidemia 05/14/2011  . Hearing loss   . Complication of anesthesia     doesn't wake up good- "usually end up in ICU"  . PONV (postoperative nausea and vomiting)   . Orthostatic hypotension     after surgery.  Marland Kitchen Heart murmur     PCP- [facility]- No tx needed.  . Stroke 05/2011    Weakness on Right. Wears leg brace.  . Foot drop, left   . H/O hiatal hernia     second one  . Neuromuscular disorder     Diabetic neuropathy- has inproved since she quit smoking.  . Arthritis     Medications:  Scheduled:    . aspirin EC      . aspirin  325 mg Oral Once  . aspirin  325 mg Oral Q breakfast  . ceFAZolin      .  ceFAZolin (ANCEF) IV  1 g Intravenous Once  . clopidogrel  75 mg Oral Once  . clopidogrel  75 mg Oral Q breakfast  . pneumococcal 23 valent vaccine  0.5 mL Intramuscular Tomorrow-1000  . DISCONTD: niMODipine  60 mg Oral 60 min Pre-Op    Assessment: 61 yr old  female s/p carotid stent on heparin at 500 units/hr.    Goal of Therapy:  Heparin level goal 0.1-0.25    Plan:  Continue heparin at 500 units/hr.   Check heparin level 8 hours after drip start, around 1930 tonight. Daily heparin level/CBC.  Wendie Simmer, PharmD, BCPS Clinical Pharmacist  Pager: [phone]

## 2011-09-01 NOTE — Transfer of Care (Signed)
Immediate Anesthesia Transfer of Care Note  Patient: Chelsey Gallagher  Procedure(s) Performed: Procedure(s) (LRB): RADIOLOGY WITH ANESTHESIA (N/A)  Patient Location: PACU  Anesthesia Type: MAC  Level of Consciousness: awake, alert , oriented and patient cooperative  Airway & Oxygen Therapy: Patient Spontanous Breathing and Patient connected to nasal cannula oxygen  Post-op Assessment: Report given to PACU RN and Post -op Vital signs reviewed and stable  Post vital signs: Reviewed and stable  Complications: No apparent anesthesia complications

## 2011-09-01 NOTE — Progress Notes (Signed)
NIMODOPINE WAS HELD, BP 89/53 AND HR 52 THIS AM [id number].

## 2011-09-01 NOTE — Anesthesia Postprocedure Evaluation (Signed)
  Anesthesia Post-op Note  Patient: Chelsey Gallagher  Procedure(s) Performed: Procedure(s) (LRB): RADIOLOGY WITH ANESTHESIA (N/A)  Patient Location: PACU  Anesthesia Type: General  Level of Consciousness: awake, alert  and oriented  Airway and Oxygen Therapy: Patient Spontanous Breathing and Patient connected to nasal cannula oxygen  Post-op Pain: mild  Post-op Assessment: Post-op Vital signs reviewed and Patient's Cardiovascular Status Stable  Post-op Vital Signs: stable  Complications: No apparent anesthesia complications

## 2011-09-02 LAB — BASIC METABOLIC PANEL
BUN: 9 mg/dL (ref 6–23)
Creatinine, Ser: 0.43 mg/dL — ABNORMAL LOW (ref 0.50–1.10)
GFR calc Af Amer: >90 mL/min (ref >90)
GFR calc non Af Amer: >90 mL/min (ref >90)
Potassium: 3.4 mEq/L — ABNORMAL LOW (ref 3.5–5.1)

## 2011-09-02 LAB — GLUCOSE, CAPILLARY
Glucose-Capillary: 102 mg/dL — ABNORMAL HIGH (ref 70–99)
Glucose-Capillary: 107 mg/dL — ABNORMAL HIGH (ref 70–99)

## 2011-09-02 MED ORDER — CITALOPRAM HYDROBROMIDE 20 MG PO TABS
20.0000 mg | ORAL_TABLET | Freq: Every day | ORAL | Status: DC
  Administered 2011-09-02 – 2011-09-03 (×2): 20 mg via ORAL
  Filled 2011-09-02 (×2): qty 1

## 2011-09-02 MED ORDER — KETOROLAC TROMETHAMINE 30 MG/ML IJ SOLN
30.0000 mg | INTRAMUSCULAR | Status: AC | PRN
  Administered 2011-09-02 (×2): 30 mg via INTRAVENOUS
  Filled 2011-09-02 (×2): qty 1

## 2011-09-02 MED ORDER — LISINOPRIL 20 MG PO TABS
20.0000 mg | ORAL_TABLET | Freq: Every day | ORAL | Status: AC
  Administered 2011-09-02: 20 mg via ORAL
  Filled 2011-09-02: qty 1

## 2011-09-02 MED ORDER — SODIUM CHLORIDE 0.9 % IV SOLN
INTRAVENOUS | Status: DC
  Administered 2011-09-02: 01:00:00 via INTRAVENOUS

## 2011-09-02 MED ORDER — SIMVASTATIN 40 MG PO TABS
40.0000 mg | ORAL_TABLET | Freq: Every day | ORAL | Status: DC
  Administered 2011-09-02: 40 mg via ORAL
  Filled 2011-09-02 (×3): qty 1

## 2011-09-02 NOTE — Progress Notes (Signed)
NIR 09-02-11 0937 PT. IDENTIFIED VERBALLY AND BY ARMBAND. ALLERGIES REVIEWED WITH PATIENT. RIGHT 7FR FEMORAL SHEATH REMOVED AT [phone]. VPAD AND MANUAL PRESSURE APPLIED TO ACHIEVE HEMOSTASIS AT 0950. DISTAL PULSES INTACT. NO COMPLICATIONS. SANDBAG AND PRESSURE DRESSING APPLIED. SITE REVIEWED WITH R.N. POST SHEATH REMOVAL ORDERS IN CHART. KRIS HINES RT-R Dorien Mayotte RT-R

## 2011-09-02 NOTE — Progress Notes (Signed)
Clinical Social Work  CSW spoke with PT who is recommending SNF. CSW sent PT evaluation and clinicals to insurance for approval. CSW called insurance and left a message to verify that they received patient's information for SNF approval. CSW spoke with SNF([facility]) who is agreeable to admission on 7/3 if insurance approval is received. CSW will continue to follow to assist with needs.  [place], [place] [phone]

## 2011-09-02 NOTE — Progress Notes (Signed)
Pt complained of headache in the back of her head 10/10.  She described it as throbbing and the pain came on gradually.  No visual disturbances like she had with the headache earlier in the day.  Pupils reacted equally.  Neuro intact.  Pedal pulses equal and palpable.  She received 1000 mg Tylenol and Dr. Corliss Skains was called.  He ordered 30 mg IV Toradol which can be repeated in 4 hours if needed.  Will reassess pain and neuro status.

## 2011-09-02 NOTE — Progress Notes (Signed)
Notified Dr. Corliss Skains regarding increased BP via Right radial a-line. The A-line reads BP as 166/60 (90) and the cuff reads BP as 122/60(70). Dr. Corliss Skains instructed me to call Pam to add the pts home blood pressure medication and to continue monitoring. Will continue to monitor    09/02/11 1030  Vitals  Pulse Rate 80   Pulse Rate Source Monitor  ECG Heart Rate 81   BP 122/60 mmHg  MAP (mmHg) 70   BP Location Left leg  BP Method Automatic  Patient Position, if appropriate Lying  Resp 17   Oxygen Therapy  SpO2 93 %  Art Line  Arterial Line BP 166/60 mmHg  Arterial Line MAP (mmHg) 90 mmHg

## 2011-09-02 NOTE — Progress Notes (Signed)
0105 Pt's headache now 4/10 after receiving 30 mg Toradol and 1000 mg Tylenol.

## 2011-09-02 NOTE — Evaluation (Signed)
Physical Therapy Evaluation Patient Details Name: Chelsey Gallagher MRN: [id number] DOB: 09/21/1950 Today's Date: 09/02/2011 Time: 1500-1530 PT Time Calculation (min): 30 min  PT Assessment / Plan / Recommendation Clinical Impression  Pt is 61 y/o female admitted for s/p R ICA stent placement.  Pt with recent left CVA with Right hemiparesis.  Pt was discharged from inpatient rehab in April and d/c home.  Pt now readmitted for procedure and reports decline in overall function since d/c home.  Pt would benefit from acute PT services to improve overall mobility and prepare for safe d/c to next venue.    PT Assessment  Patient needs continued PT services    Follow Up Recommendations  Skilled nursing facility    Barriers to Discharge        Equipment Recommendations  Defer to next venue    Recommendations for Other Services     Frequency Min 3X/week    Precautions / Restrictions Precautions Precautions: Fall (report 2 fall since home from rehab in April) Required Braces or Orthoses: Other Brace/Splint (R AFO brace)   Pertinent Vitals/Pain C/o back pain; premedicated prior to session      Mobility  Bed Mobility Bed Mobility: Supine to Sit Supine to Sit: 4: Min assist;With rails;HOB flat Details for Bed Mobility Assistance: (A) with trunk OOB with cues for proper technique Transfers Transfers: Sit to Stand;Stand to Sit;Stand Pivot Transfers Sit to Stand: 3: Mod assist;From bed Stand to Sit: 3: Mod assist;To chair/3-in-1 Stand Pivot Transfers: 3: Mod assist;With armrests Details for Transfer Assistance: (A) to maintain balance with  max cues for technique and guide hips to recliner Ambulation/Gait Ambulation/Gait Assistance: 1: +2 Total assist Ambulation/Gait: Patient Percentage: 60% Assistive device: 2 person hand held assist Ambulation/Gait Assistance Details: Pt able to take 5 side steps and step back to recliner with +2 (A) to maintain balance with cues for proper weight  shift.  Pt able to initiate RLE and to complete step. Gait Pattern: Step-to pattern;Decreased stance time - right;Decreased step length - left;Shuffle;Trunk rotated posteriorly on right    Exercises     PT Diagnosis: Difficulty walking;Generalized weakness;Abnormality of gait;Hemiplegia dominant side  PT Problem List: Decreased range of motion;Decreased activity tolerance;Decreased balance;Decreased mobility;Decreased knowledge of use of DME;Decreased knowledge of precautions;Impaired tone;Impaired sensation PT Treatment Interventions:     PT Goals Acute Rehab PT Goals PT Goal Formulation: With patient Time For Goal Achievement: 09/16/11 Potential to Achieve Goals: Good Pt will go Supine/Side to Sit: with modified independence PT Goal: Supine/Side to Sit - Progress: Goal set today Pt will go Sit to Supine/Side: with modified independence PT Goal: Sit to Supine/Side - Progress: Goal set today Pt will go Sit to Stand: with supervision PT Goal: Sit to Stand - Progress: Goal set today Pt will go Stand to Sit: with supervision PT Goal: Stand to Sit - Progress: Goal set today Pt will Transfer Bed to Chair/Chair to Bed: with min assist PT Transfer Goal: Bed to Chair/Chair to Bed - Progress: Goal set today Pt will Stand: with supervision;3 - 5 min PT Goal: Stand - Progress: Goal set today Pt will Ambulate: 16 - 50 feet;with least restrictive assistive device;with supervision PT Goal: Ambulate - Progress: Goal set today  Visit Information  Last PT Received On: 09/02/11 Assistance Needed: +2    Subjective Data  Subjective: "I have decline progress since I went home in April." Patient Stated Goal: To go to rehab to get stronger.   Prior Functioning  Home Living Lives  With: Alone (alone during day while daughter at work) Additional Comments: Pt plans to go to SNF at d/c and possible long term SNF vs ALF placement per SW progress note. Prior Function Level of Independence: Needs  assistance Needs Assistance: Bathing;Light Housekeeping;Meal Prep;Gait Bath: Minimal Meal Prep: Supervision/set-up Light Housekeeping: Total Gait Assistance: Pt was using hemiwalker and fell recently and per pt "Lost confidence and started to go backwards in my progress." Driving: No Communication Communication: No difficulties Dominant Hand: Right    Cognition  Overall Cognitive Status: Appears within functional limits for tasks assessed/performed Arousal/Alertness: Awake/alert Orientation Level: Appears intact for tasks assessed Behavior During Session: [facility] for tasks performed    Extremity/Trunk Assessment Right Upper Extremity Assessment RUE ROM/Strength/Tone: Deficits RUE ROM/Strength/Tone Deficits: Noticeable tone due to previous CVA;  Asworth scale 2+ RUE Sensation: Deficits RUE Sensation Deficits: Reports numbness/tingling;   Left Upper Extremity Assessment LUE ROM/Strength/Tone: Within functional levels LUE Sensation: WFL - Light Touch LUE Coordination: WFL - gross/fine motor Right Lower Extremity Assessment RLE ROM/Strength/Tone: Deficits RLE ROM/Strength/Tone Deficits: At least 3/5 gross with noticeable extensor tone and limited DF RLE Sensation: Deficits RLE Sensation Deficits: Reports numbness/tingling Left Lower Extremity Assessment LLE ROM/Strength/Tone: Within functional levels LLE Sensation: WFL - Light Touch   Balance Balance Balance Assessed: Yes Static Sitting Balance Static Sitting - Balance Support: Feet supported Static Sitting - Level of Assistance: 5: Stand by assistance  End of Session PT - End of Session Equipment Utilized During Treatment: Gait belt Activity Tolerance: Patient limited by fatigue Patient left: in chair;with call bell/phone within reach Nurse Communication: Mobility status  GP     Chaselyn Nanney 09/02/2011, 3:48 PM Jake Shark, PT DPT [phone]

## 2011-09-02 NOTE — Progress Notes (Signed)
OT Discharge Note: Pt with orders to d/c to SNF tomorrow. Per departmental policy, will defer OT eval to SNF. Please re-consult if d/c plan changes. Thank you. Glendale Chard, OTR/L Pager: [phone] 09/02/2011

## 2011-09-02 NOTE — Clinical Social Work Psychosocial (Signed)
     Clinical Social Work Department BRIEF PSYCHOSOCIAL ASSESSMENT 09/02/2011  Patient:  Chelsey Gallagher, Chelsey Gallagher     Account Number:  [id number]     Admit date:  09/01/2011  Clinical Social Worker:  Jacelyn Grip  Date/Time:  09/02/2011 10:30 AM  Referred by:  RN  Date Referred:  09/02/2011 Referred for  SNF Placement   Other Referral:   Interview type:  Patient Other interview type:   and patient daughter at bedside    PSYCHOSOCIAL DATA Living Status:  ALONE Admitted from facility:   Level of care:   Primary support name:  Shanda Bumps Potts/[phone] Primary support relationship to patient:  CHILD, ADULT Degree of support available:   strong    CURRENT CONCERNS Current Concerns  Post-Acute Placement   Other Concerns:    SOCIAL WORK ASSESSMENT / PLAN CSW received referral for Advanced Directives and New SNF placement. CSW met with pt and pt daughter at bedside to discuss. Pt daughter discussed that pt is currently living with pt spouse, but they are currently in the process of a divorce and he is unable to provide 24 hour assistance to pt and pt daughter unable to have pt live with pt daughter as she works full time and has a young child. Pt and pt daughter discussed that they had been discussing through PCP about placement to [facility] and Rehab and pt daughter had already been in discussion with [facility] about placement. CSW discuss process of placement and that pt insurance requires authorization before pt can be admitted to facility. CSW clarified pt and pt daughter questions about applying for Medicaid in order for coverage for longer term placement in SNF or ALF. CSW discussed Advanced Directives and pt is interested in completing Advanced Directives and CSW clarified questions in regard to decision making. CSW contacted [facility] and Rehab to discuss. Facility was familiar with the pt, but stated that they had only clarified that pt had [employer] and authorization would have to be initiated through hospital. Pt insurance requires PT/OT evaluation for initiation of authorization for SNF placement from [facility]. CSW discussed with RN who stated that she had paged MD and will discuss with MD need for order for PT/OT eval. CSW to complete FL2 and send clinicals to [facility] and initiate authorization through [facility] when PT/OT eval available. CSW to follow up with pt and pt daughter with information on Advanced Directives and applying for Medicaid. CSW to continue to follow and facilitate pt discharge needs when pt insurance authorization received.   Assessment/plan status:  Psychosocial Support/Ongoing Assessment of Needs Other assessment/ plan:   discharge planning   Information/referral to community resources:   Advanced Directives, Medicaid application, Information for SSA and applying for Medicaid    PATIENTS/FAMILYS RESPONSE TO PLAN OF CARE: Pt alert and oriented, but on bed rest at this time and can ambulate this afternoon. Pt daughter supportive and eager to have pt in safe living environment. Pt daughter appears to be strong advocate for pt. Pt and pt daughter expressed understanding about process of SNF placement and need for insurance authorization before pt discharge.

## 2011-09-02 NOTE — Progress Notes (Signed)
1 Day Post-Op  Subjective: Rt ICA pta/stent performed 7/1 Pt doing well although not comfortable laying flat No n/v; no ha now Hep off 7 am; still on Cardene  Objective: Vital signs in last 24 hours: Temp:  [96.9 F (36.1 C)-98.6 F (37 C)] 98 F (36.7 C) (07/02 0430) Pulse Rate:  [56-86] 83  (07/02 0700) Resp:  [12-20] 17  (07/02 0700) BP: (96-131)/(52-69) 106/62 mmHg (07/02 0700) SpO2:  [87 %-100 %] 91 % (07/02 0700) Arterial Line BP: (114-165)/(43-62) 143/52 mmHg (07/02 0700) Weight:  [141 lb 1.5 oz (64 kg)] 141 lb 1.5 oz (64 kg) (07/01 1930)    Intake/Output from previous day: 07/01 0701 - 07/02 0700 In: 2981 [P.O.:60; I.V.:2921] Out: 3475 [Urine:3475] Intake/Output this shift:    PE:  EOMI; A/O Face symmetrical Tongue midline Puffs cheeks - rt less than left Still in collar Rt side is weaker than left; pt uses left arm to help move rt This is not new; she has done this since CVA Rt leg moves some but only slightly; again since CVA Left arm and leg moves well Good sensation and strength Rt groin NT; no bleeding; no hematoma Sheath in place Rt foot 2+ pulses  Lab Results:  No results found for this basename: WBC:2,HGB:2,HCT:2,PLT:2 in the last 72 hours BMET  [facility] 09/02/11 0435  NA 140  K 3.4*  CL 105  CO2 25  GLUCOSE 116*  BUN 9  CREATININE 0.43*  CALCIUM 9.3   PT/INR No results found for this basename: LABPROT:2,INR:2 in the last 72 hours ABG No results found for this basename: PHART:2,PCO2:2,PO2:2,HCO3:2 in the last 72 hours  Studies/Results: Mr Shirlee Latch Wo Contrast  08/31/2011  This examination was performed at [facility] at [address] [facility]. The interpretation will be provided by [facility].  Original Report Authenticated By: Thomasenia Sales, M.D.   Mr Angiogram Neck W Wo Contrast  08/31/2011  This examination was performed at [facility] at [facility]. The interpretation will be  provided by [facility].  Original Report Authenticated By: Thomasenia Sales, M.D.   Mr Laqueta Jean Wo Contrast  08/31/2011  This examination was performed at [facility] at [facility]. The interpretation will be provided by [facility].  Original Report Authenticated By: Thomasenia Sales, M.D.    Anti-infectives: Anti-infectives     Start     Dose/Rate Route Frequency Ordered Stop   09/01/11 0633   ceFAZolin (ANCEF) 1-5 GM-% IVPB     Comments: WILHELM, JENNIFER: cabinet override         09/01/11 1478 09/01/11 1844   09/01/11 0630   ceFAZolin (ANCEF) IVPB 1 g/50 mL premix        1 g 100 mL/hr over 30 Minutes Intravenous  Once 09/01/11 2956 09/01/11 0915          Assessment/Plan: s/p Procedure(s) (LRB): RADIOLOGY WITH ANESTHESIA (N/A)  Rt Internal Carotid Artery PTA/stent placement 7/1 in IR with [name] Pt has done well overnight; slight h/a last night - responded well to Toradol Complains of stiffness in back from lying flat Pt states she is supposed to go to [facility] skilled nursing facility from here Will talk with dtr Will report to [name] Plan dc today after sheath pull; bed rest and ambulation  Debbie Bellucci A 09/02/2011

## 2011-09-02 NOTE — Progress Notes (Signed)
PA notified of pts PTA home meds needing to be restarted and PT and OT evaluation. New orders place. PT notified of consult order. Will continue to monitor.

## 2011-09-02 NOTE — Progress Notes (Signed)
PA notified of pts A-line reading. Orders to d/c A-line. Will continue to monitor cuff pressure.

## 2011-09-03 DIAGNOSIS — I119 Hypertensive heart disease without heart failure: Secondary | ICD-10-CM | POA: Diagnosis not present

## 2011-09-03 DIAGNOSIS — G459 Transient cerebral ischemic attack, unspecified: Secondary | ICD-10-CM | POA: Diagnosis not present

## 2011-09-03 LAB — GLUCOSE, CAPILLARY: Glucose-Capillary: 136 mg/dL — ABNORMAL HIGH (ref 70–99)

## 2011-09-03 MED ORDER — CLOPIDOGREL BISULFATE 75 MG PO TABS: 75.0000 mg | ORAL_TABLET | Freq: Every day | ORAL | Status: DC

## 2011-09-03 NOTE — Clinical Social Work Placement (Signed)
     Clinical Social Work Department CLINICAL SOCIAL WORK PLACEMENT NOTE 09/03/2011  Patient:  Chelsey Gallagher, Chelsey Gallagher  Account Number:  [id number] Admit date:  09/01/2011  Clinical Social Worker:  Peggyann Shoals  Date/time:  09/03/2011 06:10 PM  Clinical Social Work is seeking post-discharge placement for this patient at the following level of care:   SKILLED NURSING   (*CSW will update this form in Epic as items are completed)   09/02/2011  Patient/family provided with [facility] Department of Clinical Social Works list of facilities offering this level of care within the geographic area requested by the patient (or if unable, by the patients family).  09/02/2011  Patient/family informed of their freedom to choose among providers that offer the needed level of care, that participate in Medicare, Medicaid or managed care program needed by the patient, have an available bed and are willing to accept the patient.  09/02/2011  Patient/family informed of [facility] ownership interest in [facility], as well as of the fact that they are under no obligation to receive care at this facility.  PASARR submitted to EDS on 09/02/2011 PASARR number received from EDS on 09/02/2011  FL2 transmitted to all facilities in geographic area requested by pt/family on  09/02/2011 FL2 transmitted to all facilities within larger geographic area on   Patient informed that his/her managed care company has contracts with or will negotiate with  certain facilities, including the following:     Patient/family informed of bed offers received:  09/02/2011 Patient chooses bed at [facility] Physician recommends and patient chooses bed at  [facility]  Patient to be transferred to [facility] on  09/03/2011 Patient to be transferred to facility by [facility]  The following physician request were entered in Epic:   Additional Comments:

## 2011-09-03 NOTE — Progress Notes (Addendum)
2 Days Post-Op  Subjective: R ICA pta stent placed 7/1 Pt has done well She has been evaluated by PT/OT and Social worker is planning admit to [facility]. Awaiting insurance coverage verification before can admit to Rehab Eating and drinking well; no N/V No h/a Rt Shoulder without pain  Objective: Vital signs in last 24 hours: Temp:  [98 F (36.7 C)-98.5 F (36.9 C)] 98.1 F (36.7 C) (07/03 0700) Pulse Rate:  [69-99] 69  (07/03 0800) Resp:  [14-22] 18  (07/03 0800) BP: (90-151)/(46-105) 134/82 mmHg (07/03 0800) SpO2:  [90 %-99 %] 98 % (07/03 0800) Arterial Line BP: (143-194)/(57-64) 182/63 mmHg (07/02 1300) Weight:  [132 lb 6.4 oz (60.056 kg)] 132 lb 6.4 oz (60.056 kg) (07/03 0600) Last BM Date: 09/03/11  Intake/Output from previous day: 07/02 0701 - 07/03 0700 In: 580 [P.O.:120; I.V.:460] Out: 1700 [Urine:1700] Intake/Output this shift: Total I/O In: 20 [I.V.:20] Out: 200 [Urine:200]  PE:  EOMI; A/O Left extr moving well Rt slow and weak Rt groin NT no bleeding and no hematoma Rt foot 2+ pulses VSS; afeb  Lab Results:  No results found for this basename: WBC:2,HGB:2,HCT:2,PLT:2 in the last 72 hours BMET  [facility] 09/02/11 0435  NA 140  K 3.4*  CL 105  CO2 25  GLUCOSE 116*  BUN 9  CREATININE 0.43*  CALCIUM 9.3   PT/INR No results found for this basename: LABPROT:2,INR:2 in the last 72 hours ABG No results found for this basename: PHART:2,PCO2:2,PO2:2,HCO3:2 in the last 72 hours  Studies/Results: No results found.  Anti-infectives: Anti-infectives     Start     Dose/Rate Route Frequency Ordered Stop   09/01/11 0633   ceFAZolin (ANCEF) 1-5 GM-% IVPB     Comments: WILHELM, JENNIFER: cabinet override         09/01/11 9147 09/01/11 1844   09/01/11 0630   ceFAZolin (ANCEF) IVPB 1 g/50 mL premix        1 g 100 mL/hr over 30 Minutes Intravenous  Once 09/01/11 8295 09/01/11 0915          Assessment/Plan: s/p Procedure(s) (LRB): RADIOLOGY WITH  ANESTHESIA (N/A)  R ICA pta/stent 7/1 with [name] Pt really doing well Awaiting social worker input regarding insurance verification for rehab Plan to dc today!  Chelsey Gallagher A 09/03/2011

## 2011-09-03 NOTE — Progress Notes (Signed)
Physical Therapy Treatment Patient Details Name: Chelsey Gallagher MRN: [id number] DOB: 1950/06/14 Today's Date: 09/03/2011 Time: 1010-1038 PT Time Calculation (min): 28 min  PT Assessment / Plan / Recommendation Comments on Treatment Session  Patient very motivated to continue with therapy. Requires increase time to don AFO at EOB. Patient able to increase ambulation and plans on going to [facility] today    Follow Up Recommendations  Skilled nursing facility    Barriers to Discharge        Equipment Recommendations  Defer to next venue    Recommendations for Other Services    Frequency Min 3X/week   Plan Discharge plan remains appropriate;Frequency remains appropriate    Precautions / Restrictions Precautions Precautions: Fall Required Braces or Orthoses: Other Brace/Splint Other Brace/Splint: R AFO splint   Pertinent Vitals/Pain     Mobility  Bed Mobility Supine to Sit: 4: Min assist Details for Bed Mobility Assistance: A to position trunk into sitting Transfers Sit to Stand: 3: Mod assist;With upper extremity assist;From chair/3-in-1;From bed Stand to Sit: With upper extremity assist;To bed;To chair/3-in-1;4: Min assist Details for Transfer Assistance: Patient stood x4 with Mod A. Patient required cues for safety and positioning. Cues for turn all the way around and control descent into recliner.  Ambulation/Gait Ambulation/Gait Assistance: 1: +2 Total assist Ambulation/Gait: Patient Percentage: 70% Ambulation Distance (Feet): 80 Feet Assistive device: 2 person hand held assist Ambulation/Gait Assistance Details: A for HHA and balance. Cues for positioning of LEs and to widen BOS.  Gait Pattern: Right steppage;Step-through pattern;Decreased stride length;Scissoring    Exercises     PT Diagnosis:    PT Problem List:   PT Treatment Interventions:     PT Goals Acute Rehab PT Goals PT Goal: Supine/Side to Sit - Progress: Progressing toward goal PT Goal: Sit to  Stand - Progress: Progressing toward goal PT Goal: Stand to Sit - Progress: Progressing toward goal PT Transfer Goal: Bed to Chair/Chair to Bed - Progress: Progressing toward goal PT Goal: Stand - Progress: Progressing toward goal PT Goal: Ambulate - Progress: Progressing toward goal  Visit Information  Last PT Received On: 09/03/11 Assistance Needed: +2    Subjective Data      Cognition  Overall Cognitive Status: Appears within functional limits for tasks assessed/performed Arousal/Alertness: Awake/alert Orientation Level: Appears intact for tasks assessed Behavior During Session: [facility] for tasks performed    Balance     End of Session PT - End of Session Equipment Utilized During Treatment: Gait belt Activity Tolerance: Patient tolerated treatment well Patient left: in chair;with call bell/phone within reach Nurse Communication: Mobility status   GP     Fredrich Birks 09/03/2011, 1:19 PM 09/03/2011 Fredrich Birks PTA [phone] pager [phone] office

## 2011-09-03 NOTE — Discharge Summary (Signed)
Physician Discharge Summary  Patient ID: Chelsey Gallagher MRN: [id number] DOB/AGE: May 28, 1950 61 y.o.  Admit date: 09/01/2011 Discharge date: 09/03/2011  Admission Diagnoses:  Right Internal carotid artery stenosis; CVA  Discharge Diagnoses: Right internal carotid artery angioplasty/ stent placement 7/1 Active Problems:  * No active hospital problems. *    Discharged Condition: improved; stable  Hospital Course: Known Left Internal carotid artery occlusion; Right internal carotid artery stenosis. CVA 05/08/11. Pt consulted with [name] and was then scheduled for angioplasty/stent for R ICA stenosis. Procedure performed 09/01/11. Pt tolerated well. Overnight stay was uneventful except mild headache which responded to IV Toradol that evening.  Morning after procedure pt was stable and neuro intact.  Pt and family had arranged with Primary MD to be evaluated and admitted to [facility] from Neuro ICU. CSW was contacted and PT/OT evaluated her as inpatient. It was determined that the pt was in fact a candidate for Rehabilitation. Additional overnight stay was necessary to be sure all measures were taken and insurance validation was verified. Patient is now being discharged from [facility] to [facility]. [name] has seen and examined pt. She is appropriate for dc. Neuro intact. No complaints. Eating and drinking well. No N/V. Urination and BM normal. Follow up appt is July 16 at 2pm at [facility]. Pt aware  Consults: PT/OT; [facility]  Significant Diagnostic Studies: Cerebral arteriogram and intervention  Treatments: Right Internal carotid artery angioplasty/stent  Discharge Exam: Blood pressure 130/68, pulse 74, temperature 98.4 F (36.9 C), temperature source Oral, resp. rate 18, height 5\' 3"  (1.6 m), weight 132 lb 6.4 oz (60.056 kg), SpO2 98.00%.  Afeb; VSS Heart: RRR w/o M Lungs: CTA Abd: soft; +BS; NT Extr: Rt extrs weak and need help to move This is not new  finding: true since 3/13 Lt extrs good movement and strength Rt groin: NT; no bleeding; no hematoma Rt foot: 2+ pulses [name] has examined pt  Results for orders placed during the hospital encounter of 09/01/11  GLUCOSE, CAPILLARY      Component Value Range   Glucose-Capillary 136 (*) 70 - 99 mg/dL  GLUCOSE, CAPILLARY      Component Value Range   Glucose-Capillary 117 (*) 70 - 99 mg/dL  GLUCOSE, CAPILLARY      Component Value Range   Glucose-Capillary 95  70 - 99 mg/dL   Comment 1 Documented in Chart     Comment 2 Notify RN    MRSA PCR SCREENING      Component Value Range   MRSA by PCR NEGATIVE  NEGATIVE  HEPARIN LEVEL (UNFRACTIONATED)      Component Value Range   Heparin Unfractionated <0.10 (*) 0.30 - 0.70 IU/mL  GLUCOSE, CAPILLARY      Component Value Range   Glucose-Capillary 70  70 - 99 mg/dL  BASIC METABOLIC PANEL      Component Value Range   Sodium 140  135 - 145 mEq/L   Potassium 3.4 (*) 3.5 - 5.1 mEq/L   Chloride 105  96 - 112 mEq/L   CO2 25  19 - 32 mEq/L   Glucose, Bld 116 (*) 70 - 99 mg/dL   BUN 9  6 - 23 mg/dL   Creatinine, Ser 3.08 (*) 0.50 - 1.10 mg/dL   Calcium 9.3  8.4 - 65.7 mg/dL   GFR calc non Af Amer >90  >90 mL/min   GFR calc Af Amer >90  >90 mL/min  HEPARIN LEVEL (UNFRACTIONATED)  Component Value Range   Heparin Unfractionated <0.10 (*) 0.30 - 0.70 IU/mL  GLUCOSE, CAPILLARY      Component Value Range   Glucose-Capillary 94  70 - 99 mg/dL   Comment 1 Notify RN    GLUCOSE, CAPILLARY      Component Value Range   Glucose-Capillary 113 (*) 70 - 99 mg/dL  GLUCOSE, CAPILLARY      Component Value Range   Glucose-Capillary 102 (*) 70 - 99 mg/dL  GLUCOSE, CAPILLARY      Component Value Range   Glucose-Capillary 107 (*) 70 - 99 mg/dL   Comment 1 Notify RN     Comment 2 Documented in Chart    GLUCOSE, CAPILLARY      Component Value Range   Glucose-Capillary 136 (*) 70 - 99 mg/dL  GLUCOSE, CAPILLARY      Component Value Range    Glucose-Capillary 99  70 - 99 mg/dL    Disposition: Right Internal Carotid Artery stenosis; angioplasty/stent 09/01/11 Pt tolerated well No complaints Neuro intact Pt being discharged to [facility] facility from [facility] Pt and family aware and agreeable F/u with [name] 09/16/2011; 2pm at [facility] radiology Pt has good understanding of procedure and plan and follow up Resume all meds; including Glucophage Start Plavix 75 mg daily; Rx written   Discharge Orders    Future Appointments: Provider: Department: Dept Phone: Center:   09/23/2011 2:45 PM Erick Colace, MD [facility] [phone] None     Future Orders Please Complete By Expires   Diet - low sodium heart healthy      Increase activity slowly      Discharge instructions      Comments:   Restful x 2 days;  Follow up appt with [name] scheduled for 7/16 at 2pm (Tues)   Driving Restrictions      Comments:   No driving x 2 weeks   Lifting restrictions      Comments:   No lifting over 10 lbs x 2 weeks   Remove dressing in 24 hours      Call MD for:  severe uncontrolled pain      Call MD for:  redness, tenderness, or signs of infection (pain, swelling, redness, odor or green/yellow discharge around incision site)      Call MD for:  persistant dizziness or light-headedness        Medication List  As of 09/03/2011 12:24 PM   TAKE these medications         aspirin 325 MG buffered tablet   Take 325 mg by mouth daily.      citalopram 20 MG tablet   Commonly known as: CELEXA   Take 20 mg by mouth daily.      clopidogrel 75 MG tablet   Commonly known as: PLAVIX   Take 1 tablet (75 mg total) by mouth daily with breakfast.      dicyclomine 10 MG capsule   Commonly known as: BENTYL   Take 10 mg by mouth 4 (four) times daily -  before meals and at bedtime.      gabapentin 600 MG tablet   Commonly known as: NEURONTIN   Take 600 mg by mouth at bedtime.      insulin glargine 100 UNIT/ML injection   Commonly  known as: LANTUS   Inject 15 Units into the skin at bedtime.      lisinopril 20 MG tablet   Commonly known as: PRINIVIL,ZESTRIL   Take 20 mg by  mouth 2 (two) times daily.      metFORMIN 500 MG tablet   Commonly known as: GLUCOPHAGE   Take 500 mg by mouth 2 (two) times daily with a meal.      oxybutynin 5 MG tablet   Commonly known as: DITROPAN   Take 2.5 mg by mouth 2 (two) times daily.      pravastatin 40 MG tablet   Commonly known as: PRAVACHOL   Take 40 mg by mouth daily.      tiZANidine 4 MG tablet   Commonly known as: ZANAFLEX   Take 4 mg by mouth at bedtime.             SignedRalene Muskrat A 09/03/2011, 12:24 PM

## 2011-09-03 NOTE — Progress Notes (Signed)
Pt's IV d/c'd. Reviewed discharge instructions and medication; Pt verbalizes understanding. Report given to Joaquin Music at [place]. Pt transported by [employer].

## 2011-09-03 NOTE — Clinical Social Work Note (Signed)
Pt discharge to [facility]. Blue Medicare pre auth and ambulance auth was obtained. CSW met with pt to provide information on applying for disability and Medicaid. CSW also provided Advanced Directives packet and explained information. Pt was agreeable to discharge plan. [facility]AR provided transportation to facility. CSW is signing off as no further needs identified.   Dede Query, MSW, [facility]ors [phone]

## 2011-09-15 ENCOUNTER — Telehealth (HOSPITAL_COMMUNITY): Payer: Self-pay

## 2011-09-15 NOTE — Telephone Encounter (Signed)
I left another vm in regards to the F/U apt

## 2011-09-16 ENCOUNTER — Ambulatory Visit (HOSPITAL_COMMUNITY): Payer: Medicare Other

## 2011-09-18 ENCOUNTER — Ambulatory Visit (HOSPITAL_COMMUNITY)
Admission: RE | Admit: 2011-09-18 | Discharge: 2011-09-18 | Disposition: A | Discharge status: Home / Self Care | Payer: Medicare Other | Source: Ambulatory Visit | Attending: Interventional Radiology | Admitting: Interventional Radiology

## 2011-09-23 ENCOUNTER — Encounter: Payer: Self-pay | Admitting: Physical Medicine & Rehabilitation

## 2011-09-23 ENCOUNTER — Ambulatory Visit (HOSPITAL_BASED_OUTPATIENT_CLINIC_OR_DEPARTMENT_OTHER): Payer: Medicare Other | Admitting: Physical Medicine & Rehabilitation

## 2011-09-23 ENCOUNTER — Encounter: Payer: Medicare Other | Attending: Physical Medicine & Rehabilitation

## 2011-09-23 VITALS — BP 116/74 | HR 104 | Resp 14 | Ht 63.0 in | Wt 140.0 lb

## 2011-09-23 DIAGNOSIS — G8110 Spastic hemiplegia affecting unspecified side: Secondary | ICD-10-CM

## 2011-09-23 DIAGNOSIS — I739 Peripheral vascular disease, unspecified: Secondary | ICD-10-CM | POA: Insufficient documentation

## 2011-09-23 DIAGNOSIS — E119 Type 2 diabetes mellitus without complications: Secondary | ICD-10-CM | POA: Insufficient documentation

## 2011-09-23 DIAGNOSIS — I10 Essential (primary) hypertension: Secondary | ICD-10-CM | POA: Insufficient documentation

## 2011-09-23 DIAGNOSIS — G8190 Hemiplegia, unspecified affecting unspecified side: Secondary | ICD-10-CM | POA: Insufficient documentation

## 2011-09-23 DIAGNOSIS — I6350 Cerebral infarction due to unspecified occlusion or stenosis of unspecified cerebral artery: Secondary | ICD-10-CM

## 2011-09-23 DIAGNOSIS — I69922 Dysarthria following unspecified cerebrovascular disease: Secondary | ICD-10-CM

## 2011-09-23 DIAGNOSIS — S99919A Unspecified injury of unspecified ankle, initial encounter: Secondary | ICD-10-CM | POA: Insufficient documentation

## 2011-09-23 DIAGNOSIS — Z87891 Personal history of nicotine dependence: Secondary | ICD-10-CM | POA: Insufficient documentation

## 2011-09-23 DIAGNOSIS — S8990XA Unspecified injury of unspecified lower leg, initial encounter: Secondary | ICD-10-CM | POA: Insufficient documentation

## 2011-09-23 DIAGNOSIS — X500XXA Overexertion from strenuous movement or load, initial encounter: Secondary | ICD-10-CM | POA: Insufficient documentation

## 2011-09-23 DIAGNOSIS — Z96649 Presence of unspecified artificial hip joint: Secondary | ICD-10-CM | POA: Insufficient documentation

## 2011-09-23 DIAGNOSIS — I639 Cerebral infarction, unspecified: Secondary | ICD-10-CM

## 2011-09-23 DIAGNOSIS — E785 Hyperlipidemia, unspecified: Secondary | ICD-10-CM | POA: Insufficient documentation

## 2011-09-23 NOTE — Progress Notes (Signed)
Subjective:    Patient ID: Chelsey Gallagher, female    DOB: Aug 20, 1950, 61 y.o.   MRN: [id number]  HPI 61 year old right-handed female with history of diabetes mellitus and tobacco abuse admitted March 7 with right-sided weakness and mild slurred speech. 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 General activity 0 Relation with others 0 Enjoyment of life 0 What TIME of day is your pain at its worst? N/A Sleep (in general) Fair  Pain is worse with: N/A Pain improves with: N/A Relief from Meds: N/A  Mobility walk with assistance how many minutes can you walk? 5 minutes with assistance ability to climb steps?  yes do you drive?  no use a wheelchair needs help with transfers  Function disabled: date disabled 2010 I need assistance with the following:  dressing, toileting and shopping  Neuro/Psych bladder control problems weakness numbness tingling trouble walking  Prior Studies Any changes since last visit?  no  Physicians involved in your care Any changes since last visit?  no   Family History  Problem Relation Age of Onset  . Cancer Maternal Aunt     breast   History   Social History  . Marital Status: Single    Spouse Name: N/A    Number of Children: N/A  . Years of Education: N/A   Social History Main Topics  . Smoking status: Former Smoker -- 0.0 packs/day for 45 years    Types: Cigarettes  . Smokeless tobacco: Former Neurosurgeon    Quit date: 05/08/2011  . Alcohol Use: No  . Drug Use: No  . Sexually Active: None   Other Topics Concern  . None   Social History Narrative   Lives by herself but  still close to family. Not very active, does housework.    Past Surgical History  Procedure Date  . Total hip arthroplasty 2008    Right  . Abdominal hysterectomy 2003  . Cesarean section 1984  . Cholecystectomy 1984  . Bypass graft 2003    Abdominal aortic  . Kidney stone surgery 1999  . Appendectomy 2011  . Hernia repair 2011  . Lithotripsy   . Abdominal hysterectomy    Past Medical History  Diagnosis Date  . Diabetes mellitus   . Hypertension   . Depression   . PAD (peripheral artery disease)     S/p bypass grafting, unclear exactly where  . Nephrolithiasis   . Hyperlipidemia 05/14/2011  . Hearing loss   . Complication of anesthesia     doesn't wake up good- "usually end up in ICU"  . PONV (postoperative nausea and vomiting)   . Orthostatic hypotension     after surgery.  Marland Kitchen Heart murmur     PCP- [facility]- No tx needed.  . Stroke 05/2011    Weakness on Right. Wears leg brace.  . Foot drop, left   . H/O hiatal hernia     second one  . Neuromuscular disorder     Diabetic neuropathy- has inproved since she quit smoking.  . Arthritis    There were no vitals taken for this visit.      Review of Systems  HENT: Negative.   Eyes: Negative.   Respiratory: Negative.   Cardiovascular: Negative.   Gastrointestinal: Negative.   Genitourinary: Positive for urgency.  Musculoskeletal: Positive for gait problem.  Skin: Negative.   Neurological: Positive for weakness and numbness.       Tingling  Hematological: Negative.   Psychiatric/Behavioral: Negative.        Objective:   Physical Exam  Constitutional: She appears well-developed and well-nourished.  Eyes: Conjunctivae and EOM are normal. Pupils are equal, round, and reactive to light.  Neurological: A sensory deficit is present. She exhibits abnormal muscle tone. Coordination and gait abnormal.       2-/5 in RFF and FE 3-/5 Biceps, Triceps 3-/5 Knee ext 0/5 at R ankle  Reduced sensation R  UE + dysarthria  Psychiatric: Her mood appears anxious. Her affect is inappropriate. Her speech is rapid and/or pressured. She expresses impulsivity and inappropriate judgment.          Assessment & Plan:  1.  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

## 2011-09-23 NOTE — Patient Instructions (Signed)
To have speech pathology evaluation at [facility]. I can send an order if they send me an order sheet. I'll see him back in 6 weeks. I will look at your right hand and see whether or not we need to repeat Botox

## 2011-11-07 ENCOUNTER — Ambulatory Visit (HOSPITAL_BASED_OUTPATIENT_CLINIC_OR_DEPARTMENT_OTHER): Payer: Medicare Other | Admitting: Physical Medicine & Rehabilitation

## 2011-11-07 ENCOUNTER — Encounter: Payer: Self-pay | Admitting: Physical Medicine & Rehabilitation

## 2011-11-07 VITALS — BP 185/69 | HR 87 | Resp 14 | Ht 63.0 in | Wt 140.0 lb

## 2011-11-07 DIAGNOSIS — G8190 Hemiplegia, unspecified affecting unspecified side: Secondary | ICD-10-CM | POA: Insufficient documentation

## 2011-11-07 DIAGNOSIS — X500XXA Overexertion from strenuous movement or load, initial encounter: Secondary | ICD-10-CM | POA: Insufficient documentation

## 2011-11-07 DIAGNOSIS — E785 Hyperlipidemia, unspecified: Secondary | ICD-10-CM | POA: Insufficient documentation

## 2011-11-07 DIAGNOSIS — G8110 Spastic hemiplegia affecting unspecified side: Secondary | ICD-10-CM

## 2011-11-07 DIAGNOSIS — S99919A Unspecified injury of unspecified ankle, initial encounter: Secondary | ICD-10-CM | POA: Insufficient documentation

## 2011-11-07 DIAGNOSIS — Z87891 Personal history of nicotine dependence: Secondary | ICD-10-CM | POA: Insufficient documentation

## 2011-11-07 DIAGNOSIS — I10 Essential (primary) hypertension: Secondary | ICD-10-CM | POA: Insufficient documentation

## 2011-11-07 DIAGNOSIS — S8990XA Unspecified injury of unspecified lower leg, initial encounter: Secondary | ICD-10-CM | POA: Insufficient documentation

## 2011-11-07 DIAGNOSIS — Z96649 Presence of unspecified artificial hip joint: Secondary | ICD-10-CM | POA: Insufficient documentation

## 2011-11-07 DIAGNOSIS — E119 Type 2 diabetes mellitus without complications: Secondary | ICD-10-CM | POA: Insufficient documentation

## 2011-11-07 DIAGNOSIS — I739 Peripheral vascular disease, unspecified: Secondary | ICD-10-CM | POA: Insufficient documentation

## 2011-11-07 NOTE — Progress Notes (Signed)
Subjective:    Patient ID: Chelsey Gallagher, female    DOB: Feb 07, 1951, 61 y.o.   MRN: [id number] 61 year old right-handed female with history of diabetes mellitus and tobacco abuse admitted March 7 with right-sided weakness and mild slurred speech. 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 General activity 0 Relation with others 0 Enjoyment of life 0 What TIME of day is your pain at its worst? n/a Sleep (in general) Fair  Pain is worse with: some activites Pain improves with: pacing activities and medication Relief from Meds: 2  Mobility how many minutes can you walk? 0 ability to climb steps?  no do you drive?  no use a wheelchair  Function disabled: date disabled   Neuro/Psych weakness numbness trouble walking  Prior Studies Any changes since last visit?  no  Physicians involved in your care Any changes since last visit?  no   Family History  Problem Relation Age of Onset  . Cancer Maternal Aunt     breast   History   Social History  . Marital Status: Single    Spouse Name: N/A    Number of Children: N/A  . Years of Education: N/A   Social History Main Topics  . Smoking status: Former Smoker -- 0.0 packs/day for 45 years    Types: Cigarettes  . Smokeless tobacco: Former Neurosurgeon    Quit date: 05/08/2011  . Alcohol Use: No  . Drug Use: No  . Sexually Active: None   Other Topics Concern  . None   Social History Narrative   Lives by herself but still close to family. Not very active, does housework.    Past Surgical History  Procedure Date  . Total hip arthroplasty 2008    Right  . Abdominal hysterectomy 2003  . Cesarean section 1984  . Cholecystectomy 1984  . Bypass graft 2003    Abdominal aortic  . Kidney stone  surgery 1999  . Appendectomy 2011  . Hernia repair 2011  . Lithotripsy   . Abdominal hysterectomy    Past Medical History  Diagnosis Date  . Diabetes mellitus   . Hypertension   . Depression   . PAD (peripheral artery disease)     S/p bypass grafting, unclear exactly where  . Nephrolithiasis   . Hyperlipidemia 05/14/2011  . Hearing loss   . Complication of anesthesia     doesn't wake up good- "usually end up in ICU"  . PONV (postoperative nausea and vomiting)   . Orthostatic hypotension     after surgery.  Marland Kitchen Heart murmur     PCP- [facility]- No tx needed.  . Stroke 05/2011    Weakness on Right. Wears leg brace.  . Foot drop, left   . H/O hiatal hernia     second one  . Neuromuscular disorder     Diabetic neuropathy- has inproved since she quit smoking.  . Arthritis    BP 185/69  Pulse 87  Resp 14  Ht 5\' 3"  (1.6 m)  Wt 140 lb (63.504 kg)  BMI 24.80 kg/m2  SpO2 95%     Review of Systems  Musculoskeletal: Positive for myalgias, arthralgias and gait problem.  Neurological: Positive for weakness and numbness.  All other systems reviewed and are negative.  Objective:   Physical Exam  Constitutional: She appears well-developed and well-nourished.  Eyes: Conjunctivae and EOM are normal. Pupils are equal, round, and reactive to light.  Neurological: A sensory deficit is present. She exhibits abnormal muscle tone. Coordination and gait abnormal.  2-/5 in RFF and FE 3-/5 Biceps, Triceps 3-/5 Knee ext 0/5 at R ankle Ashworth grade 3 in the finger flexors and wrist flexors on the right side Ashworth grade 3 on the right bicep Reduced sensation R UE + dysarthria  Psychiatric: Her mood appears anxious. Her affect is inappropriate. Her speech is rapid and/or pressured. She expresses impulsivity and inappropriate judgment.           Assessment & Plan:  1. 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

## 2011-11-07 NOTE — Patient Instructions (Signed)
Botox injection next visit Will order OT after Botox injection

## 2011-11-21 ENCOUNTER — Encounter: Payer: Medicare Other | Attending: Physical Medicine & Rehabilitation

## 2011-11-21 ENCOUNTER — Encounter: Payer: Self-pay | Admitting: Physical Medicine & Rehabilitation

## 2011-11-21 ENCOUNTER — Ambulatory Visit (HOSPITAL_BASED_OUTPATIENT_CLINIC_OR_DEPARTMENT_OTHER): Payer: Medicare Other | Admitting: Physical Medicine & Rehabilitation

## 2011-11-21 VITALS — BP 178/74 | HR 89 | Resp 16 | Ht 63.0 in | Wt 136.0 lb

## 2011-11-21 DIAGNOSIS — G8110 Spastic hemiplegia affecting unspecified side: Secondary | ICD-10-CM

## 2011-11-21 NOTE — Progress Notes (Signed)
Botox Injection for spasticity using needle EMG guidance  Dilution: 50 Units/ml Indication: Severe spasticity which interferes with ADL,mobility and/or  hygiene and is unresponsive to medication management and other conservative care Informed consent was obtained after describing risks and benefits of the procedure with the patient. This includes bleeding, bruising, infection, excessive weakness, or medication side effects. A REMS form is on file and signed. Needle: 27 gauge 1 inch  needle electrode Number of units per muscle Pectoralis0 Biceps100 FCR50 FCU0 Brachioradialis 50 FDP50  PL 50 Gastrosoleus0 Hamstrings0 All injections were done after obtaining appropriate EMG activity and after negative drawback for blood. The patient tolerated the procedure well. Post procedure instructions were given. A followup appointment was made.   HPI   Review of Systems   Physical Exam

## 2011-11-21 NOTE — Patient Instructions (Signed)
Ice to right bicep area for 20 minutes 4 times per day for the next 2 days See me back in one month

## 2011-11-24 ENCOUNTER — Other Ambulatory Visit: Payer: Self-pay | Admitting: Radiology

## 2011-11-24 DIAGNOSIS — I6529 Occlusion and stenosis of unspecified carotid artery: Secondary | ICD-10-CM

## 2011-11-25 ENCOUNTER — Telehealth (HOSPITAL_COMMUNITY): Payer: Self-pay

## 2011-11-25 NOTE — Telephone Encounter (Signed)
I spoke with Mrs. Potts, Mrs. Wentland's daughter and gave her the doppler study apt.  She had a good understanding.

## 2011-12-09 ENCOUNTER — Ambulatory Visit (HOSPITAL_COMMUNITY)
Admission: RE | Admit: 2011-12-09 | Discharge: 2011-12-09 | Disposition: A | Discharge status: Home / Self Care | Payer: Medicare Other | Source: Ambulatory Visit | Attending: Interventional Radiology | Admitting: Interventional Radiology

## 2011-12-09 DIAGNOSIS — I6529 Occlusion and stenosis of unspecified carotid artery: Secondary | ICD-10-CM | POA: Insufficient documentation

## 2011-12-09 DIAGNOSIS — Z48812 Encounter for surgical aftercare following surgery on the circulatory system: Secondary | ICD-10-CM

## 2011-12-09 NOTE — Progress Notes (Signed)
VASCULAR LAB PRELIMINARY  PRELIMINARY  PRELIMINARY  PRELIMINARY  Carotid duplex completed.    Preliminary report:  Right:  Patent ICA stent.  Left:  Short segment of occlusion in the left ICA.  Retrograde flow in an ECA branch feeds the ICA.  No significant change in the left side since study of March 2013.  Bilateral:  Vertebral artery flow is antegrade.  Keyler Hoge, RVT 12/09/2011, 10:50 AM

## 2011-12-11 ENCOUNTER — Telehealth (HOSPITAL_COMMUNITY): Payer: Self-pay

## 2011-12-11 ENCOUNTER — Other Ambulatory Visit: Payer: Self-pay | Admitting: Radiology

## 2011-12-11 DIAGNOSIS — G8110 Spastic hemiplegia affecting unspecified side: Secondary | ICD-10-CM

## 2011-12-11 DIAGNOSIS — I639 Cerebral infarction, unspecified: Secondary | ICD-10-CM

## 2011-12-11 NOTE — Telephone Encounter (Signed)
I spoke with Chelsey Gallagher and advised her that Dr. Corliss Skains will order a f/u doppler in 3 to 4 months.  Everything is stable right now.

## 2011-12-22 ENCOUNTER — Encounter: Payer: Medicare Other | Attending: Physical Medicine & Rehabilitation

## 2011-12-22 ENCOUNTER — Encounter: Payer: Self-pay | Admitting: Physical Medicine & Rehabilitation

## 2011-12-22 ENCOUNTER — Ambulatory Visit (HOSPITAL_BASED_OUTPATIENT_CLINIC_OR_DEPARTMENT_OTHER): Payer: Medicare Other | Admitting: Physical Medicine & Rehabilitation

## 2011-12-22 VITALS — BP 162/58 | HR 82 | Resp 14 | Ht 63.0 in | Wt 131.0 lb

## 2011-12-22 DIAGNOSIS — X500XXA Overexertion from strenuous movement or load, initial encounter: Secondary | ICD-10-CM | POA: Insufficient documentation

## 2011-12-22 DIAGNOSIS — I739 Peripheral vascular disease, unspecified: Secondary | ICD-10-CM | POA: Insufficient documentation

## 2011-12-22 DIAGNOSIS — S8990XA Unspecified injury of unspecified lower leg, initial encounter: Secondary | ICD-10-CM | POA: Insufficient documentation

## 2011-12-22 DIAGNOSIS — Z96649 Presence of unspecified artificial hip joint: Secondary | ICD-10-CM | POA: Insufficient documentation

## 2011-12-22 DIAGNOSIS — G8110 Spastic hemiplegia affecting unspecified side: Secondary | ICD-10-CM

## 2011-12-22 DIAGNOSIS — G8190 Hemiplegia, unspecified affecting unspecified side: Secondary | ICD-10-CM | POA: Insufficient documentation

## 2011-12-22 DIAGNOSIS — E785 Hyperlipidemia, unspecified: Secondary | ICD-10-CM | POA: Insufficient documentation

## 2011-12-22 DIAGNOSIS — I10 Essential (primary) hypertension: Secondary | ICD-10-CM | POA: Insufficient documentation

## 2011-12-22 DIAGNOSIS — E119 Type 2 diabetes mellitus without complications: Secondary | ICD-10-CM | POA: Insufficient documentation

## 2011-12-22 DIAGNOSIS — Z87891 Personal history of nicotine dependence: Secondary | ICD-10-CM | POA: Insufficient documentation

## 2011-12-22 NOTE — Progress Notes (Signed)
Subjective:    Patient ID: Chelsey Gallagher, female    DOB: 12-05-1950, 61 y.o.   MRN: [id number] This is a 61 year old right-handed female  with history of diabetes mellitus, tobacco abuse admitted May 08, 2011,  with right-sided weakness and mild slurred speech. MRI of the brain  showed acute nonhemorrhagic left paracentral medulla infarction and  small acute posterior left cerebellar infarction. Echocardiogram with  ejection fraction 70% and grade 1 diastolic dysfunction. Carotid  Doppler showed 60-79% internal carotid artery stenosis. CTA of the head  and neck showed normal origin of great vessels from aortic arch. HPI Botox 9/20 Needle: 27 gauge 1 inch  needle electrode Number of units per muscle Pectoralis0 Biceps100 FCR50 FCU0 Brachioradialis 50 FDP50  PL 50  Restarted OT after Botox injection Still at the skilled nursing facility No significant functional improvements after Botox injection reported Pain Inventory Average Pain 0 Pain Right Now 0 My pain is intermittent  In the last 24 hours, has pain interfered with the following? General activity 0 Relation with others 0 Enjoyment of life 0 What TIME of day is your pain at its worst? n/a Sleep (in general) Fair  Pain is worse with: some activites Pain improves with: pacing activities and medication Relief from Meds: 2  Mobility use a cane how many minutes can you walk? 5 use a wheelchair Do you have any goals in this area?  yes  Function disabled: date disabled   Neuro/Psych weakness  Prior Studies Any changes since last visit?  no  Physicians involved in your care Any changes since last visit?  no   Family History  Problem Relation Age of Onset  . Cancer Maternal Aunt     breast   History   Social History  . Marital Status: Single    Spouse Name: N/A    Number of Children: N/A  . Years of Education: N/A   Social History Main Topics  . Smoking status: Former Smoker -- 0.0 packs/day  for 45 years    Types: Cigarettes  . Smokeless tobacco: Former Neurosurgeon    Quit date: 05/08/2011  . Alcohol Use: No  . Drug Use: No  . Sexually Active: None   Other Topics Concern  . None   Social History Narrative   Lives by herself but still close to family. Not very active, does housework.    Past Surgical History  Procedure Date  . Total hip arthroplasty 2008    Right  . Abdominal hysterectomy 2003  . Cesarean section 1984  . Cholecystectomy 1984  . Bypass graft 2003    Abdominal aortic  . Kidney stone surgery 1999  . Appendectomy 2011  . Hernia repair 2011  . Lithotripsy   . Abdominal hysterectomy    Past Medical History  Diagnosis Date  . Diabetes mellitus   . Hypertension   . Depression   . PAD (peripheral artery disease)     S/p bypass grafting, unclear exactly where  . Nephrolithiasis   . Hyperlipidemia 05/14/2011  . Hearing loss   . Complication of anesthesia     doesn't wake up good- "usually end up in ICU"  . PONV (postoperative nausea and vomiting)   . Orthostatic hypotension     after surgery.  Marland Kitchen Heart murmur     PCP- [facility]- No tx needed.  . Stroke 05/2011    Weakness on Right. Wears leg brace.  . Foot drop, left   . H/O hiatal hernia  second one  . Neuromuscular disorder     Diabetic neuropathy- has inproved since she quit smoking.  . Arthritis    BP 162/58  Pulse 82  Resp 14  Ht 5\' 3"  (1.6 m)  Wt 131 lb (59.421 kg)  BMI 23.21 kg/m2  SpO2 97%     Review of Systems  Musculoskeletal: Positive for myalgias, arthralgias and gait problem.  Neurological: Positive for weakness.  All other systems reviewed and are negative.       Objective:   Physical Exam  Constitutional: She is oriented to person, place, and time. She appears well-developed and well-nourished.  HENT:  Head: Normocephalic and atraumatic.  Neurological: She is alert and oriented to person, place, and time. No sensory deficit.       3 minus right knee  extension 0 at the ankle dorsiflexor plantar flexor has an AFO on   Right upper extremity strength is 0/5 at the deltoid 1/5 at the biceps triceps and grip influenced by tone Ashworth grade 1 at the biceps And wrist flexors Ashworth grade 2 at the finger flexors Ashworth grade 3 at the pectoralis      Assessment & Plan:  1. 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 Return to clinic if increasing tone of the upper extremity interferes with mobility hygiene or ADLs

## 2011-12-22 NOTE — Patient Instructions (Addendum)
If she starts getting increasing tone and muscle spasms in the right arm that are preventing you from dressing or bathing or causing pain please make an appointment so we can reevaluate

## 2012-03-29 ENCOUNTER — Telehealth: Payer: Self-pay | Admitting: *Deleted

## 2012-03-29 NOTE — Telephone Encounter (Signed)
Message left and put into nurse line was calling to schedule appointment.

## 2012-04-02 ENCOUNTER — Ambulatory Visit: Payer: Medicare Other | Admitting: Physical Medicine & Rehabilitation

## 2012-04-04 ENCOUNTER — Inpatient Hospital Stay (HOSPITAL_COMMUNITY)
Admission: EM | Admit: 2012-04-04 | Discharge: 2012-04-07 | DRG: 312 | Disposition: A | Discharge status: Skilled Nursing Facility | Payer: Medicare Other | Attending: Internal Medicine | Admitting: Internal Medicine

## 2012-04-04 DIAGNOSIS — I951 Orthostatic hypotension: Principal | ICD-10-CM | POA: Diagnosis present

## 2012-04-04 DIAGNOSIS — I6350 Cerebral infarction due to unspecified occlusion or stenosis of unspecified cerebral artery: Secondary | ICD-10-CM | POA: Diagnosis present

## 2012-04-04 DIAGNOSIS — Z79899 Other long term (current) drug therapy: Secondary | ICD-10-CM

## 2012-04-04 DIAGNOSIS — I10 Essential (primary) hypertension: Secondary | ICD-10-CM | POA: Diagnosis present

## 2012-04-04 DIAGNOSIS — R011 Cardiac murmur, unspecified: Secondary | ICD-10-CM | POA: Diagnosis present

## 2012-04-04 DIAGNOSIS — R220 Localized swelling, mass and lump, head: Secondary | ICD-10-CM

## 2012-04-04 DIAGNOSIS — H9190 Unspecified hearing loss, unspecified ear: Secondary | ICD-10-CM | POA: Diagnosis present

## 2012-04-04 DIAGNOSIS — N912 Amenorrhea, unspecified: Secondary | ICD-10-CM

## 2012-04-04 DIAGNOSIS — R05 Cough: Secondary | ICD-10-CM | POA: Diagnosis present

## 2012-04-04 DIAGNOSIS — I69959 Hemiplegia and hemiparesis following unspecified cerebrovascular disease affecting unspecified side: Secondary | ICD-10-CM

## 2012-04-04 DIAGNOSIS — G8110 Spastic hemiplegia affecting unspecified side: Secondary | ICD-10-CM

## 2012-04-04 DIAGNOSIS — R059 Cough, unspecified: Secondary | ICD-10-CM | POA: Diagnosis present

## 2012-04-04 DIAGNOSIS — Z87891 Personal history of nicotine dependence: Secondary | ICD-10-CM

## 2012-04-04 DIAGNOSIS — E119 Type 2 diabetes mellitus without complications: Secondary | ICD-10-CM

## 2012-04-04 DIAGNOSIS — Z720 Tobacco use: Secondary | ICD-10-CM | POA: Diagnosis present

## 2012-04-04 DIAGNOSIS — R7989 Other specified abnormal findings of blood chemistry: Secondary | ICD-10-CM

## 2012-04-04 DIAGNOSIS — I639 Cerebral infarction, unspecified: Secondary | ICD-10-CM | POA: Diagnosis present

## 2012-04-04 DIAGNOSIS — J984 Other disorders of lung: Secondary | ICD-10-CM

## 2012-04-04 DIAGNOSIS — E1149 Type 2 diabetes mellitus with other diabetic neurological complication: Secondary | ICD-10-CM | POA: Diagnosis present

## 2012-04-04 DIAGNOSIS — R040 Epistaxis: Secondary | ICD-10-CM | POA: Diagnosis present

## 2012-04-04 DIAGNOSIS — E871 Hypo-osmolality and hyponatremia: Secondary | ICD-10-CM | POA: Diagnosis present

## 2012-04-04 DIAGNOSIS — Z794 Long term (current) use of insulin: Secondary | ICD-10-CM

## 2012-04-04 DIAGNOSIS — E1142 Type 2 diabetes mellitus with diabetic polyneuropathy: Secondary | ICD-10-CM | POA: Diagnosis present

## 2012-04-04 DIAGNOSIS — D62 Acute posthemorrhagic anemia: Secondary | ICD-10-CM | POA: Diagnosis present

## 2012-04-04 DIAGNOSIS — Z7982 Long term (current) use of aspirin: Secondary | ICD-10-CM

## 2012-04-04 DIAGNOSIS — K5289 Other specified noninfective gastroenteritis and colitis: Secondary | ICD-10-CM | POA: Diagnosis present

## 2012-04-04 DIAGNOSIS — E785 Hyperlipidemia, unspecified: Secondary | ICD-10-CM | POA: Diagnosis present

## 2012-04-04 LAB — COMPREHENSIVE METABOLIC PANEL
Albumin: 3.1 g/dL — ABNORMAL LOW (ref 3.5–5.2)
Alkaline Phosphatase: 63 U/L (ref 39–117)
BUN: 48 mg/dL — ABNORMAL HIGH (ref 6–23)
Chloride: 97 mEq/L (ref 96–112)
Creatinine, Ser: 0.96 mg/dL (ref 0.50–1.10)
GFR calc Af Amer: 73 mL/min — ABNORMAL LOW (ref >90)
Glucose, Bld: 160 mg/dL — ABNORMAL HIGH (ref 70–99)
Potassium: 4.2 mEq/L (ref 3.5–5.1)
Total Bilirubin: 0.1 mg/dL — ABNORMAL LOW (ref 0.3–1.2)
Total Protein: 6.1 g/dL (ref 6.0–8.3)

## 2012-04-04 LAB — CBC WITH DIFFERENTIAL/PLATELET
Basophils Relative: 0 % (ref 0–1)
Eosinophils Absolute: 0.1 10*3/uL (ref 0.0–0.7)
HCT: 25.4 % — ABNORMAL LOW (ref 36.0–46.0)
Hemoglobin: 8.4 g/dL — ABNORMAL LOW (ref 12.0–15.0)
Lymphs Abs: 1.8 10*3/uL (ref 0.7–4.0)
MCH: 29 pg (ref 26.0–34.0)
MCHC: 33.1 g/dL (ref 30.0–36.0)
Monocytes Absolute: 0.5 10*3/uL (ref 0.1–1.0)
Monocytes Relative: 8 % (ref 3–12)
Neutrophils Relative %: 63 % (ref 43–77)
RBC: 2.9 MIL/uL — ABNORMAL LOW (ref 3.87–5.11)

## 2012-04-04 LAB — OCCULT BLOOD, POC DEVICE: Fecal Occult Bld: NEGATIVE

## 2012-04-04 NOTE — ED Notes (Signed)
[facility] notified of HGB

## 2012-04-04 NOTE — ED Notes (Signed)
Bed:WA24<BR> Expected date:<BR> Expected time:<BR> Means of arrival:<BR> Comments:<BR> EMS

## 2012-04-04 NOTE — ED Notes (Addendum)
Pt coming from [facility]. Staff states that pt has had n/v/d this wk but today she was diaphoretic and in and out of alertness so staff called EMS. SOB. Pt was placed on 2L [place] and felt better. CBG 170. Hx of CVA and R sided paralysis.

## 2012-04-05 ENCOUNTER — Encounter (HOSPITAL_COMMUNITY): Payer: Self-pay

## 2012-04-05 ENCOUNTER — Inpatient Hospital Stay (HOSPITAL_COMMUNITY): Payer: Medicare Other

## 2012-04-05 DIAGNOSIS — R6889 Other general symptoms and signs: Secondary | ICD-10-CM

## 2012-04-05 DIAGNOSIS — N912 Amenorrhea, unspecified: Secondary | ICD-10-CM

## 2012-04-05 LAB — URINALYSIS, ROUTINE W REFLEX MICROSCOPIC
Bilirubin Urine: NEGATIVE
Ketones, ur: NEGATIVE mg/dL
Leukocytes, UA: NEGATIVE
Nitrite: NEGATIVE
Urobilinogen, UA: 1 mg/dL (ref 0.0–1.0)

## 2012-04-05 LAB — IRON AND TIBC
Iron: 29 ug/dL — ABNORMAL LOW (ref 42–135)
TIBC: 293 ug/dL (ref 250–470)
UIBC: 264 ug/dL (ref 125–400)

## 2012-04-05 LAB — ABO/RH: ABO/RH(D): B POS

## 2012-04-05 LAB — FERRITIN: Ferritin: 43 ng/mL (ref 10–291)

## 2012-04-05 LAB — CBC
HCT: 28 % — ABNORMAL LOW (ref 36.0–46.0)
Hemoglobin: 8.7 g/dL — ABNORMAL LOW (ref 12.0–15.0)
Hemoglobin: 9.1 g/dL — ABNORMAL LOW (ref 12.0–15.0)
MCH: 28.8 pg (ref 26.0–34.0)
MCV: 88.3 fL (ref 78.0–100.0)
RBC: 3.02 MIL/uL — ABNORMAL LOW (ref 3.87–5.11)
RBC: 3.17 MIL/uL — ABNORMAL LOW (ref 3.87–5.11)
RDW: 13.9 % (ref 11.5–15.5)
WBC: 5.7 10*3/uL (ref 4.0–10.5)
WBC: 6.6 10*3/uL (ref 4.0–10.5)

## 2012-04-05 LAB — COMPREHENSIVE METABOLIC PANEL
ALT: 16 U/L (ref 0–35)
Alkaline Phosphatase: 60 U/L (ref 39–117)
CO2: 23 mEq/L (ref 19–32)
Chloride: 101 mEq/L (ref 96–112)
GFR calc Af Amer: 76 mL/min — ABNORMAL LOW (ref >90)
GFR calc non Af Amer: 65 mL/min — ABNORMAL LOW (ref >90)
Glucose, Bld: 87 mg/dL (ref 70–99)
Potassium: 4.3 mEq/L (ref 3.5–5.1)
Sodium: 133 mEq/L — ABNORMAL LOW (ref 135–145)
Total Bilirubin: 0.1 mg/dL — ABNORMAL LOW (ref 0.3–1.2)

## 2012-04-05 LAB — TYPE AND SCREEN

## 2012-04-05 LAB — FOLATE: Folate: 18.6 ng/mL

## 2012-04-05 LAB — HEMOGLOBIN A1C: Mean Plasma Glucose: 105 mg/dL (ref <117)

## 2012-04-05 LAB — TSH: TSH: 0.06 u[IU]/mL — ABNORMAL LOW (ref 0.350–4.500)

## 2012-04-05 MED ORDER — OXYMETAZOLINE HCL 0.05 % NA SOLN
2.0000 | Freq: Two times a day (BID) | NASAL | Status: DC | PRN
  Filled 2012-04-05: qty 15

## 2012-04-05 MED ORDER — OXYBUTYNIN CHLORIDE 5 MG PO TABS
2.5000 mg | ORAL_TABLET | Freq: Two times a day (BID) | ORAL | Status: DC
  Administered 2012-04-05 – 2012-04-07 (×5): 2.5 mg via ORAL
  Filled 2012-04-05 (×8): qty 0.5

## 2012-04-05 MED ORDER — ACETAMINOPHEN 325 MG PO TABS
650.0000 mg | ORAL_TABLET | Freq: Four times a day (QID) | ORAL | Status: DC | PRN
  Filled 2012-04-05: qty 2

## 2012-04-05 MED ORDER — CLOPIDOGREL BISULFATE 75 MG PO TABS
75.0000 mg | ORAL_TABLET | Freq: Every day | ORAL | Status: DC
  Administered 2012-04-05 – 2012-04-07 (×3): 75 mg via ORAL
  Filled 2012-04-05 (×4): qty 1

## 2012-04-05 MED ORDER — DICYCLOMINE HCL 10 MG PO CAPS
10.0000 mg | ORAL_CAPSULE | Freq: Three times a day (TID) | ORAL | Status: DC
  Administered 2012-04-05 – 2012-04-07 (×10): 10 mg via ORAL
  Filled 2012-04-05 (×13): qty 1

## 2012-04-05 MED ORDER — LISINOPRIL 20 MG PO TABS
20.0000 mg | ORAL_TABLET | Freq: Two times a day (BID) | ORAL | Status: DC
  Administered 2012-04-05 – 2012-04-07 (×5): 20 mg via ORAL
  Filled 2012-04-05 (×6): qty 1

## 2012-04-05 MED ORDER — GABAPENTIN 300 MG PO CAPS
600.0000 mg | ORAL_CAPSULE | Freq: Every day | ORAL | Status: DC
  Administered 2012-04-05: 600 mg via ORAL
  Filled 2012-04-05 (×2): qty 2

## 2012-04-05 MED ORDER — SODIUM CHLORIDE 0.9 % IV SOLN
INTRAVENOUS | Status: DC
  Administered 2012-04-05: 09:00:00 via INTRAVENOUS

## 2012-04-05 MED ORDER — ONDANSETRON HCL 4 MG PO TABS: 4.0000 mg | ORAL_TABLET | Freq: Four times a day (QID) | ORAL | Status: DC | PRN

## 2012-04-05 MED ORDER — ALBUTEROL SULFATE (5 MG/ML) 0.5% IN NEBU
2.5000 mg | INHALATION_SOLUTION | Freq: Three times a day (TID) | RESPIRATORY_TRACT | Status: DC
  Administered 2012-04-05 (×3): 2.5 mg via RESPIRATORY_TRACT
  Filled 2012-04-05 (×3): qty 0.5

## 2012-04-05 MED ORDER — INSULIN GLARGINE 100 UNIT/ML ~~LOC~~ SOLN
7.5000 [IU] | Freq: Every day | SUBCUTANEOUS | Status: DC
  Administered 2012-04-05: 7.5 [IU] via SUBCUTANEOUS

## 2012-04-05 MED ORDER — INSULIN ASPART 100 UNIT/ML ~~LOC~~ SOLN
0.0000 [IU] | Freq: Three times a day (TID) | SUBCUTANEOUS | Status: DC
  Administered 2012-04-07: 1 [IU] via SUBCUTANEOUS

## 2012-04-05 MED ORDER — SIMVASTATIN 20 MG PO TABS
20.0000 mg | ORAL_TABLET | Freq: Every day | ORAL | Status: DC
  Administered 2012-04-05 – 2012-04-06 (×2): 20 mg via ORAL
  Filled 2012-04-05 (×3): qty 1

## 2012-04-05 MED ORDER — CITALOPRAM HYDROBROMIDE 20 MG PO TABS
20.0000 mg | ORAL_TABLET | Freq: Every day | ORAL | Status: DC
  Administered 2012-04-05 – 2012-04-07 (×3): 20 mg via ORAL
  Filled 2012-04-05 (×3): qty 1

## 2012-04-05 MED ORDER — TIZANIDINE HCL 4 MG PO TABS
4.0000 mg | ORAL_TABLET | Freq: Every day | ORAL | Status: DC
  Administered 2012-04-05: 4 mg via ORAL
  Filled 2012-04-05 (×2): qty 1

## 2012-04-05 MED ORDER — HYDRALAZINE HCL 20 MG/ML IJ SOLN
10.0000 mg | INTRAMUSCULAR | Status: DC | PRN
  Administered 2012-04-05 (×2): 10 mg via INTRAVENOUS
  Filled 2012-04-05 (×2): qty 1

## 2012-04-05 MED ORDER — ONDANSETRON HCL 4 MG/2ML IJ SOLN: 4.0000 mg | Freq: Four times a day (QID) | INTRAMUSCULAR | Status: DC | PRN

## 2012-04-05 MED ORDER — ACETAMINOPHEN 650 MG RE SUPP: 650.0000 mg | Freq: Four times a day (QID) | RECTAL | Status: DC | PRN

## 2012-04-05 MED ORDER — IPRATROPIUM BROMIDE 0.02 % IN SOLN
0.5000 mg | Freq: Three times a day (TID) | RESPIRATORY_TRACT | Status: DC
  Administered 2012-04-05 (×3): 0.5 mg via RESPIRATORY_TRACT
  Filled 2012-04-05 (×3): qty 2.5

## 2012-04-05 MED ORDER — SALINE SPRAY 0.65 % NA SOLN
1.0000 | NASAL | Status: DC | PRN
  Filled 2012-04-05: qty 44

## 2012-04-05 NOTE — Clinical Social Work Psychosocial (Signed)
Clinical Social Work Department BRIEF PSYCHOSOCIAL ASSESSMENT 04/05/2012  Patient:  Chelsey Gallagher, Chelsey Gallagher     Account Number:  [id number]     Admit date:  04/04/2012  Clinical Social Worker:  Jodelle Red  Date/Time:  04/05/2012 11:18 AM  Referred by:  CSW  Date Referred:  04/05/2012 Referred for  Other - See comment   Other Referral:   admitted from SNF   Interview type:  Family Other interview type:   chart review and phone call to daughter, Malva Limes    PSYCHOSOCIAL DATA Living Status:  FACILITY Admitted from facility:  [facility] Level of care:  Skilled Nursing Facility Primary support name:  Malva Limes Primary support relationship to patient:  CHILD, ADULT Degree of support available:   good from daughter and boyfriend at SNF    CURRENT CONCERNS Current Concerns  Post-Acute Placement   Other Concerns:    SOCIAL WORK ASSESSMENT / PLAN CSW spoke with Pt's daughter over the phone to discuss SNF plans. Pt has been at [facility] since July and plans are to return. Daughter was in process of looking at ALFs for Pt and her boyfriend, but daughter feels best plan for Pt is to return to present SNF and then pursue ALF after Pt is recovered from this hospital stay.   Assessment/plan status:  Psychosocial Support/Ongoing Assessment of Needs Other assessment/ plan:   assist with return to SNF.   Information/referral to community resources:    PATIENT'S/FAMILY'S RESPONSE TO PLAN OF CARE: Pt's daughter appreciative of asst. CSW explained role and how CSW can assist. Current plan is to return to [facility]. Prelim FL2 on chart for signing. FL2 will need updates ass Pt progresses. Pt will need PT to assess as well. CSW to follow.    Doreen Salvage, LCSW ICU/Stepdown Clinical Social Worker [facility] Cell [phone] Hours 8am-1200pm M-F

## 2012-04-05 NOTE — H&P (Signed)
Hospitalist Admission History and Physical  Patient name: Chelsey Gallagher Medical record number: [id number] Date of birth: 1950-04-24 Age: 62 y.o. Gender: female  Primary Care Provider: Betsey Holiday, MD  Chief Complaint: Weakness History of Present Illness:This is a 62 y.o. year old female with significant past medical history of CVA with residual R sided hemiparesis, diabetes, hypertension presenting with weakness.  Patient is a resident of [facility]. Patient states that she was eating dinner earlier today when she had an episode of severe coughing/vomiting after drinking some diet ginger ale. Patient states that she became extremely weak with this episode. Nursing home staff check patient's vitals and her blood pressure is noted to be very low. Blood sugar was normal at that time. Patient reports having some viral gastroenteritis symptoms over the past 3-4 days including having multiple episodes of nonbilious nonbloody emesis and diarrhea over this time frame. Patient has been unable to hold down very little food or fluid. Urine output has been mildly decreased. Patient is also on aspirin and Plavix for her history of stroke patient is also noted to have a carotid stent on the right side. Patient states that she has been on full dose aspirin and has been having recurrent severe nosebleeds over the past 3-4 weeks. Patient states that episodes of nose bleeding lasts several minutes and even up to near an hour before resolving. Patient denies any hematemesis or bright red blood per rectum. Baseline right-sided hemiparesis and spasticity. This is been unchanged.  Blood sugars have been stable at the nursing home per patient  On presentation to the ED patient is noted to have blood pressures in the low limits of normal as well as a hemoglobin of around 8 which is approximately 4-5 points off of baseline. Hemoccult was negative on presentation.  Patient Active Problem List  Diagnosis  .  CVA (cerebral infarction)  . Hypertension  . Diabetes mellitus  . Mandibular mass  . Lung abnormality  . Hyperlipidemia  . Tobacco abuse  . Stroke  . Spastic hemiplegia affecting dominant side   Past Medical History: Past Medical History  Diagnosis Date  . Diabetes mellitus   . Hypertension   . Depression   . PAD (peripheral artery disease)     S/p bypass grafting, unclear exactly where  . Nephrolithiasis   . Hyperlipidemia 05/14/2011  . Hearing loss   . Complication of anesthesia     doesn't wake up good- "usually end up in ICU"  . PONV (postoperative nausea and vomiting)   . Orthostatic hypotension     after surgery.  Marland Kitchen Heart murmur     PCP- [facility]- No tx needed.  . Stroke 05/2011    Weakness on Right. Wears leg brace.  . Foot drop, left   . H/O hiatal hernia     second one  . Neuromuscular disorder     Diabetic neuropathy- has inproved since she quit smoking.  . Arthritis     Past Surgical History: Past Surgical History  Procedure Date  . Total hip arthroplasty 2008    Right  . Abdominal hysterectomy 2003  . Cesarean section 1984  . Cholecystectomy 1984  . Bypass graft 2003    Abdominal aortic  . Kidney stone surgery 1999  . Appendectomy 2011  . Hernia repair 2011  . Lithotripsy   . Abdominal hysterectomy     Social History: History   Social History  . Marital Status: Single    Spouse Name: N/A  Number of Children: N/A  . Years of Education: N/A   Social History Main Topics  . Smoking status: Former Smoker -- 0.0 packs/day for 45 years    Types: Cigarettes  . Smokeless tobacco: Former Neurosurgeon    Quit date: 05/08/2011  . Alcohol Use: No  . Drug Use: No  . Sexually Active: Not on file   Other Topics Concern  . Not on file   Social History Narrative   Lives by herself but still close to family. Not very active, does housework.     Family History: Family History  Problem Relation Age of Onset  . Cancer Maternal Aunt      breast    Allergies: Allergies  Allergen Reactions  . Codeine   . Flexeril (Cyclobenzaprine Hcl)   . Lyrica (Pregabalin) Other (See Comments)    "wires my up"    Current Facility-Administered Medications  Medication Dose Route Frequency Provider Last Rate Last Dose  . 0.9 %  sodium chloride infusion   Intravenous Continuous Doree Albee, MD      . acetaminophen (TYLENOL) tablet 650 mg  650 mg Oral Q6H PRN Doree Albee, MD       Or  . acetaminophen (TYLENOL) suppository 650 mg  650 mg Rectal Q6H PRN Doree Albee, MD      . citalopram (CELEXA) tablet 20 mg  20 mg Oral Daily Doree Albee, MD      . clopidogrel (PLAVIX) tablet 75 mg  75 mg Oral Q breakfast Doree Albee, MD      . dicyclomine (BENTYL) capsule 10 mg  10 mg Oral TID AC & HS Doree Albee, MD      . gabapentin (NEURONTIN) tablet 600 mg  600 mg Oral QHS Doree Albee, MD      . insulin aspart (novoLOG) injection 0-9 Units  0-9 Units Subcutaneous TID WC Doree Albee, MD      . insulin glargine (LANTUS) injection 7.5 Units  7.5 Units Subcutaneous QHS Doree Albee, MD      . ondansetron [facility]) tablet 4 mg  4 mg Oral Q6H PRN Doree Albee, MD       Or  . ondansetron [facility]) injection 4 mg  4 mg Intravenous Q6H PRN Doree Albee, MD      . oxybutynin (DITROPAN) tablet 2.5 mg  2.5 mg Oral BID Doree Albee, MD      . simvastatin (ZOCOR) tablet 20 mg  20 mg Oral q1800 Doree Albee, MD      . tiZANidine (ZANAFLEX) tablet 4 mg  4 mg Oral QHS Doree Albee, MD       Current Outpatient Prescriptions  Medication Sig Dispense Refill  . aspirin 325 MG buffered tablet Take 325 mg by mouth daily.      . citalopram (CELEXA) 20 MG tablet Take 20 mg by mouth daily.      . clopidogrel (PLAVIX) 75 MG tablet Take 1 tablet (75 mg total) by mouth daily with breakfast.  30 tablet  0  . dicyclomine (BENTYL) 10 MG capsule Take 10 mg by mouth 4 (four) times daily -  before meals and at bedtime.      . gabapentin (NEURONTIN) 600 MG tablet  Take 600 mg by mouth at bedtime.      . insulin glargine (LANTUS) 100 UNIT/ML injection Inject 15 Units into the skin at bedtime.      Marland Kitchen lisinopril (PRINIVIL,ZESTRIL) 20 MG tablet Take 20 mg by mouth 2 (two) times daily.      Marland Kitchen  metFORMIN (GLUCOPHAGE) 500 MG tablet Take 500 mg by mouth 2 (two) times daily with a meal.      . oxybutynin (DITROPAN) 5 MG tablet Take 2.5 mg by mouth 2 (two) times daily.      . pravastatin (PRAVACHOL) 40 MG tablet Take 40 mg by mouth daily.       Marland Kitchen tiZANidine (ZANAFLEX) 4 MG tablet Take 4 mg by mouth at bedtime.       Review Of Systems: 12 point ROS negative except as noted above in HPI.  Physical Exam: Filed Vitals:   04/05/12 0230  BP: 99/52  Pulse: 64  Temp:   Resp:     General: alert, cooperative and appears older than stated age HEENT: PERRLA and extra ocular movement intact, dry oral mucous membranes.  Heart: S1, S2 normal, no murmur, rub or gallop, regular rate and rhythm Lungs: clear to auscultation, no wheezes or rales and unlabored breathing Abdomen: abdomen is soft without significant tenderness, masses, organomegaly or guarding Extremities: extremities normal, atraumatic, no cyanosis or edema Skin:no rashes Neurology: R sided hemiparesis, noted spasticity predominantly in RUE  Labs and Imaging: Lab Results  Component Value Date/Time   NA 129* 04/04/2012  9:44 PM   K 4.2 04/04/2012  9:44 PM   CL 97 04/04/2012  9:44 PM   CO2 22 04/04/2012  9:44 PM   BUN 48* 04/04/2012  9:44 PM   CREATININE 0.96 04/04/2012  9:44 PM   GLUCOSE 160* 04/04/2012  9:44 PM   Lab Results  Component Value Date   WBC 6.3 04/04/2012   HGB 8.4* 04/04/2012   HCT 25.4* 04/04/2012   MCV 87.6 04/04/2012   PLT 124* 04/04/2012   No results found.    Assessment and Plan: Chelsey Gallagher is a 62 y.o. year old female presenting with weakness:  Weakness: I suspect this is likely multifactorial with contributions of hypovolemia from gastroenteritis as well as anemia. Will hydrate patient  with normal saline. Zofran for nausea. We'll check anemia panel. Hemoccult is negative which is reassuring. However, patient does have elevated BUN, which may be a secondary sign of hemolysis versus dehydration. Will recheck hemoglobin in a.m.  Anemia: Suspect is a likely acute on chronic blood loss. Increased risk for bleeding as patient is on full dose aspirin and Plavix. Hemoccult negative which is reassuring. No signs of active nosebleed on HEENT exam. We'll hold aspirin continue Plavix. We'll check anemia panel. We'll reassess hemoglobin in a.m.  Hyponatremia: Hypovolemic in setting of gastroenteritis. Will hydrate with normal saline. Patient with no prior history of CHF. Will fairly aggressively hydrate and reassess.   Gastroenteritis: We'll symptomatically treat with Zofran. Contact precautions. IV fluids.  Hypertension: Holding antihypertensives as patient is currently hypotensive.  CVA: On aspirin and Plavix with concomitant anemia (concern for acute vs. Sub acute blood loss). HAS-BLED score of 3-4. We'll hold aspirin and continue Plavix. Currently clinically at baseline.  Diabetes: SSI. Lantus at half home dose. We'll hold metformin.  FEN/GI: Heart healthy, carb modified diet. Zofran for nausea. PPI.   Prophylaxis: SCDs given ? Bleeding risk.   Disposition: Pending further evaluation.   Code Status:full code        Doree Albee MD  Pager: [phone]

## 2012-04-05 NOTE — Evaluation (Signed)
Physical Therapy Evaluation Patient Details Name: Chelsey Gallagher MRN: [id number] DOB: 08-Sep-1950 Today's Date: 04/05/2012 Time: 4098-1191 PT Time Calculation (min): 24 min  PT Assessment / Plan / Recommendation Clinical Impression  62 yo with hx of CVA with R sided hypertonic hemiplegia admitted with anemia and recent GI illness.  Pt is feeling better and seems to be nearing baseline functional mobility.  She does not feel she would benefit from more physical therapy and that she will be OK to D/C baclk to SNF at previous level    PT Assessment  Patient needs continued PT services    Follow Up Recommendations  No PT follow up;Supervision for mobility/OOB;Supervision - Intermittent    Does the patient have the potential to tolerate intense rehabilitation      Barriers to Discharge        Equipment Recommendations  None recommended by PT    Recommendations for Other Services     Frequency Min 3X/week    Precautions / Restrictions Precautions Precautions: Fall Required Braces or Orthoses: Other Brace/Splint (pt reports she has a brace and shoe at the SNF) Restrictions Weight Bearing Restrictions: No   Pertinent Vitals/Pain No c/o pain      Mobility  Bed Mobility Bed Mobility: Rolling Right;Supine to Sit Rolling Right: 5: Supervision;With rail Supine to Sit: 4: Min assist;With rails;HOB elevated Details for Bed Mobility Assistance: pt needs some assist moving on unfamiliar hospital bed/ICU mattress Transfers Transfers: Stand Pivot Transfers Stand Pivot Transfers: 4: Min assist Details for Transfer Assistance: pt with min assist when transferring to  toward left side  to reach left arm for armrest to pivot on left foot to get into chair  Ambulation/Gait Ambulation/Gait Assistance: Not tested (comment);Other (comment) (pt reports she is unable to walk without brace and shoe) Wheelchair Mobility Wheelchair Mobility: No    Exercises     PT Diagnosis: Hemiplegia dominant  side  PT Problem List: Decreased balance;Decreased mobility PT Treatment Interventions: DME instruction;Functional mobility training;Therapeutic exercise;Balance training;Therapeutic activities   PT Goals Acute Rehab PT Goals PT Goal Formulation: With patient Pt will Roll Supine to Right Side: with modified independence PT Goal: Rolling Supine to Right Side - Progress: Goal set today Pt will Roll Supine to Left Side: with modified independence PT Goal: Rolling Supine to Left Side - Progress: Goal set today Pt will go Supine/Side to Sit: with modified independence PT Goal: Supine/Side to Sit - Progress: Goal set today Pt will Sit at Edge of Bed: with modified independence PT Goal: Sit at Edge Of Bed - Progress: Goal set today Pt will Transfer Bed to Chair/Chair to Bed: with supervision PT Transfer Goal: Bed to Chair/Chair to Bed - Progress: Goal set today  Visit Information  Last PT Received On: 04/05/12 Assistance Needed: +2    Subjective Data  Subjective: "I need my brace and my shoe to walk" Patient Stated Goal: to go back to how she was doing before admission   Prior Functioning  Home Living Available Help at Discharge: Skilled Nursing Facility Type of Home: Skilled Nursing Facility Prior Function Level of Independence: Needs assistance Needs Assistance: Gait;Transfers Gait Assistance: pt reports she has to have help when she walks with RW/ brace/ shoe.  she is able to propel W/C Transfer Assistance: assist for balance Comments: pt reports she is no longer getting therapy and she is OK with how she is doing Communication Communication: No difficulties    Cognition  Cognition Overall Cognitive Status: Appears within functional limits for  tasks assessed/performed Arousal/Alertness: Awake/alert Orientation Level: Appears intact for tasks assessed Behavior During Session: [facility] for tasks performed    Extremity/Trunk Assessment Right Lower Extremity Assessment RLE  ROM/Strength/Tone: Deficits RLE ROM/Strength/Tone Deficits: pt with spastic hemiplegia with inversion and plantarflexion at ankle RLE Sensation: Deficits Left Lower Extremity Assessment LLE ROM/Strength/Tone: Within functional levels LLE Sensation: WFL - Light Touch;WFL - Proprioception Trunk Assessment Trunk Assessment: Other exceptions Trunk Exceptions: some weakness in right trunk as well with balance impairment   Balance Balance Balance Assessed: Yes Static Sitting Balance Static Sitting - Balance Support: Left upper extremity supported Static Sitting - Level of Assistance: 4: Min assist Static Sitting - Comment/# of Minutes: pt with tendency to lose balance when sitting on EOB Static Standing Balance Static Standing - Balance Support: Left upper extremity supported Static Standing - Level of Assistance: 4: Min assist  End of Session PT - End of Session Activity Tolerance: Patient tolerated treatment well Patient left: in chair;with call bell/phone within reach  GP    Quantavis Obryant K. [place], [place] [phone]  04/05/2012, 1:42 PM

## 2012-04-05 NOTE — Progress Notes (Signed)
[id number]/QIONGE Earlene Plater, RN, BSN, CCM:  CHART REVIEWED AND UPDATED.  Next chart review due on [id number]. NO DISCHARGE NEEDS PRESENT AT THIS TIME.  Patient from [place]. CASE MANAGEMENT [phone]

## 2012-04-05 NOTE — Progress Notes (Signed)
[facility] PROGRESS NOTE  Chelsey Gallagher GMW:[id number] DOB: June 17, 1950 DOA: 04/04/2012 PCP: Betsey Holiday, MD  Assessment/Plan  KIORA HALLBERG is a 62 y.o. year old female presenting with weakness:   Weakness:  Likely due to hypovolemia from gastroenteritis as well as anemia.  -  Continue hydration -  F/u anemia work up, hgb currently stable.  Anemia:  Suspect is a likely acute on chronic blood loss.  -  Agree with holding aspirin, continue plavix -  Repeat CBC at 10am, if hgb stable, may transfer to telemetry -  Follow up TSH, B12, folate, iron panel.  Add SPEP  Hyponatremia: Hypovolemic in setting of gastroenteritis.  -  Continue gentle hydration Elevated BUN:  No evidence of of hemolysis on exam and bilirubin is normal -  May be due to UGIB [place] Artist vs. Hemorrhagic gastritis) or epistaxis with trickle into stomach  Gastroenteritis: We'll symptomatically treat with Zofran. -  No diarrhea or nausea recently, resolving.   -  Continue empiric contact precautions  Cough and desaturations to low 80s and high 70s with good waveform:  May be due to viral illness, however, may also have component of COPD given smoking history.  May have some sleep apnea.   -  CXR -  Duonebs -  IS  Epistaxis:   -  Cold compresses prn -  Afrin for refractory -  Saline and vaseline to nares  Hypertension with hypoTN.  BP now stable -  Continue to monitor blood pressure and hold BP medications for now  CVA: On aspirin and Plavix with concomitant anemia (concern for acute vs. Sub acute blood loss). HAS-BLED score of 3-4.  -  Hold aspirin and continue Plavix.  Diabetes:  Glucose 87 on AML -  Continue SSI.  -  lantus 7.5 units (1/2 home dose) -  Hold metformin   Diet:  Heart healthy, carb modified diet.  Access:  PIV IVF:  NS at 119ml/h Proph:  SCDs  Code Status: full code Family Communication: spoke with patient alone Disposition Plan: pending further evaluation.  If  hemoglobin and blood pressure remain stable this morning, she may transfer out to telemetry for ongoing care.     Consultants:  none  Procedures:  none  Antibiotics:  none   HPI/Subjective:  Patient states that she is feeling better.  Her nausea, vomiting, and diarrhea have been getting better.  No nosebleeds since admission.  Denies chest pain, but has some mild cough.  Denies SOB, wheeze.  Has baseline  Urinary incontinence.  No BM since Thursday (had diarrhea and poor Po intake since that time)  Objective: Filed Vitals:   04/05/12 0204 04/05/12 0227 04/05/12 0230 04/05/12 0330  BP: 92/46 105/56 99/52 105/51  Pulse: 64 70 64 65  Temp:      TempSrc:      Resp:    14  Height:    5\' 3"  (1.6 m)  Weight:    64.3 kg (141 lb 12.1 oz)  SpO2: 95% 97% 94% 97%    Intake/Output Summary (Last 24 hours) at 04/05/12 0700 Last data filed at 04/05/12 0630  Gross per 24 hour  Intake    100 ml  Output    150 ml  Net    -50 ml   Filed Weights   04/05/12 0330  Weight: 64.3 kg (141 lb 12.1 oz)    Exam:   General:  Caucasian female, no acute distress, lying in bed, hard of hearing  HEENT:  Pallor  of MM, MMM  Cardiovascular:  RRR, 2/6 murmur at the LSB and apex, no rubs or gallops, 2+ pulses, warm extremities  Respiratory:  Course bilateral breath sounds.  No obvious wheeze or rales.  No increased WOB.    Abdomen:  NABS, soft, abdominal wall hernia, nontender, nondistended  MSK:  Decreased tone and bulk of right extremities with contractures of arm and hand.  NO LEE.    Neuro:  Mild right facial droop and mild dysarthria, 4/5 strength RUE and 0/5 RLE, 5/5 left extremities.  Decreased sensation to light touch of the right upper and lower extremities  Data Reviewed: Basic Metabolic Panel:  Lab 04/05/12 1478 04/04/12 2144  NA 133* 129*  K 4.3 4.2  CL 101 97  CO2 23 22  GLUCOSE 87 160*  BUN 48* 48*  CREATININE 0.93 0.96  CALCIUM 8.9 8.9  MG -- --  PHOS -- --   Liver  Function Tests:  Lab 04/05/12 0345 04/04/12 2144  AST 21 21  ALT 16 16  ALKPHOS 60 63  BILITOT 0.1* 0.1*  PROT 5.9* 6.1  ALBUMIN 3.0* 3.1*   No results found for this basename: LIPASE:5,AMYLASE:5 in the last 168 hours No results found for this basename: AMMONIA:5 in the last 168 hours CBC:  Lab 04/05/12 0345 04/04/12 2144  WBC 5.7 6.3  NEUTROABS -- 4.0  HGB 8.7* 8.4*  HCT 26.6* 25.4*  MCV 88.1 87.6  PLT 127* 124*   Cardiac Enzymes: No results found for this basename: CKTOTAL:5,CKMB:5,CKMBINDEX:5,TROPONINI:5 in the last 168 hours BNP (last 3 results) No results found for this basename: PROBNP:3 in the last 8760 hours CBG: No results found for this basename: GLUCAP:5 in the last 168 hours  Recent Results (from the past 240 hour(s))  MRSA PCR SCREENING     Status: Normal   Collection Time   04/05/12  3:23 AM      Component Value Range Status Comment   MRSA by PCR NEGATIVE  NEGATIVE Final      Studies: No results found.  Scheduled Meds:   . citalopram  20 mg Oral Daily  . clopidogrel  75 mg Oral Q breakfast  . dicyclomine  10 mg Oral TID AC & HS  . gabapentin  600 mg Oral QHS  . insulin aspart  0-9 Units Subcutaneous TID WC  . insulin glargine  7.5 Units Subcutaneous QHS  . oxybutynin  2.5 mg Oral BID  . simvastatin  20 mg Oral q1800  . tiZANidine  4 mg Oral QHS   Continuous Infusions:   . sodium chloride      Active Problems:  * No active hospital problems. *     Time spent: 30 min    Girtha Kilgore, [facility]  [facility] Pager [phone]. If 8PM-8AM, please contact night-coverage at [email or web address], password [facility] 04/05/2012, 7:00 AM  LOS: 1 day

## 2012-04-05 NOTE — ED Provider Notes (Signed)
History     CSN: [id number]  Arrival date & time 04/04/12  2138   First MD Initiated Contact with Patient 04/04/12 2258      Chief Complaint  Patient presents with  . Near Syncope    (Consider location/radiation/quality/duration/timing/severity/associated sxs/prior treatment) HPI Comments: Patient live at [facility] for the past 67 months post CVA reports frequent nose bleeds , last week had N/V/D but none for the past several days now feeling weak.    The history is provided by the patient and the nursing home.    Past Medical History  Diagnosis Date  . Diabetes mellitus   . Hypertension   . Depression   . PAD (peripheral artery disease)     S/p bypass grafting, unclear exactly where  . Nephrolithiasis   . Hyperlipidemia 05/14/2011  . Hearing loss   . Complication of anesthesia     doesn't wake up good- "usually end up in ICU"  . PONV (postoperative nausea and vomiting)   . Orthostatic hypotension     after surgery.  Marland Kitchen Heart murmur     PCP- [facility]- No tx needed.  . Stroke 05/2011    Weakness on Right. Wears leg brace.  . Foot drop, left   . H/O hiatal hernia     second one  . Neuromuscular disorder     Diabetic neuropathy- has inproved since she quit smoking.  . Arthritis     Past Surgical History  Procedure Date  . Total hip arthroplasty 2008    Right  . Abdominal hysterectomy 2003  . Cesarean section 1984  . Cholecystectomy 1984  . Bypass graft 2003    Abdominal aortic  . Kidney stone surgery 1999  . Appendectomy 2011  . Hernia repair 2011  . Lithotripsy   . Abdominal hysterectomy     Family History  Problem Relation Age of Onset  . Cancer Maternal Aunt     breast    History  Substance Use Topics  . Smoking status: Former Smoker -- 0.0 packs/day for 45 years    Types: Cigarettes  . Smokeless tobacco: Former Neurosurgeon    Quit date: 05/08/2011  . Alcohol Use: No    OB History    Grav Para Term Preterm Abortions TAB SAB Ect Mult  Living                  Review of Systems  Constitutional: Negative for fever and chills.  HENT: Positive for nosebleeds.   Respiratory: Negative for chest tightness and shortness of breath.   Cardiovascular: Negative for chest pain.  Gastrointestinal: Positive for nausea, vomiting and diarrhea. Negative for abdominal pain, constipation, blood in stool and rectal pain.  Musculoskeletal: Negative for joint swelling.  Skin: Negative for rash and wound.  Neurological: Negative for dizziness and numbness.    Allergies  Codeine; Flexeril; and Lyrica  Home Medications   Current Outpatient Rx  Name  Route  Sig  Dispense  Refill  . ASPIRIN BUFFERED 325 MG PO TABS   Oral   Take 325 mg by mouth daily.         Marland Kitchen CITALOPRAM HYDROBROMIDE 20 MG PO TABS   Oral   Take 20 mg by mouth daily.         Marland Kitchen CLOPIDOGREL BISULFATE 75 MG PO TABS   Oral   Take 1 tablet (75 mg total) by mouth daily with breakfast.   30 tablet   0   . DICYCLOMINE HCL 10  MG PO CAPS   Oral   Take 10 mg by mouth 4 (four) times daily -  before meals and at bedtime.         Marland Kitchen GABAPENTIN 600 MG PO TABS   Oral   Take 600 mg by mouth at bedtime.         . INSULIN GLARGINE 100 UNIT/ML [place] SOLN   Subcutaneous   Inject 15 Units into the skin at bedtime.         Marland Kitchen LISINOPRIL 20 MG PO TABS   Oral   Take 20 mg by mouth 2 (two) times daily.         Marland Kitchen METFORMIN HCL 500 MG PO TABS   Oral   Take 500 mg by mouth 2 (two) times daily with a meal.         . OXYBUTYNIN CHLORIDE 5 MG PO TABS   Oral   Take 2.5 mg by mouth 2 (two) times daily.         Marland Kitchen PRAVASTATIN SODIUM 40 MG PO TABS   Oral   Take 40 mg by mouth daily.          Marland Kitchen TIZANIDINE HCL 4 MG PO TABS   Oral   Take 4 mg by mouth at bedtime.           BP 99/52  Pulse 64  Temp 98.4 F (36.9 C) (Oral)  Resp 16  SpO2 94%  Physical Exam  Constitutional: She appears well-developed and well-nourished.  HENT:  Head: Normocephalic.  Eyes:  Pupils are equal, round, and reactive to light.  Cardiovascular: Normal rate.   Pulmonary/Chest: Effort normal.  Abdominal: Soft. She exhibits no distension. There is no tenderness.  Genitourinary: Guaiac negative stool.  Musculoskeletal: She exhibits edema.  Neurological: She is alert.  Skin: Skin is warm. There is pallor.    ED Course  Procedures (including critical care time)  Labs Reviewed  CBC WITH DIFFERENTIAL - Abnormal; Notable for the following:    RBC 2.90 (*)     Hemoglobin 8.4 (*)     HCT 25.4 (*)     Platelets 124 (*)     All other components within normal limits  COMPREHENSIVE METABOLIC PANEL - Abnormal; Notable for the following:    Sodium 129 (*)     Glucose, Bld 160 (*)     BUN 48 (*)     Albumin 3.1 (*)     Total Bilirubin 0.1 (*)     GFR calc non Af Amer 63 (*)     GFR calc Af Amer 73 (*)     All other components within normal limits  URINALYSIS, ROUTINE W REFLEX MICROSCOPIC - Abnormal; Notable for the following:    Hgb urine dipstick TRACE (*)     All other components within normal limits  URINE MICROSCOPIC-ADD ON - Abnormal; Notable for the following:    Casts HYALINE CASTS (*)     All other components within normal limits  OCCULT BLOOD, POC DEVICE  HEMOGLOBIN A1C  TSH  CBC  COMPREHENSIVE METABOLIC PANEL  VITAMIN B12  FOLATE  IRON AND TIBC  FERRITIN  RETICULOCYTES  TYPE AND SCREEN   No results found.   1. [place]     ED ECG REPORT   Date: 04/05/2012  EKG Time: 3:14 AM  Rate: 61  Rhythm: normal sinus rhythm,  unchanged from previous tracings  Axis: normal  Intervals:none  ST&T Change: early R/S transition  Narrative Interpretation:probable LVH  MDM  Labs review showing a significant drop in her H&H         Arman Filter, NP 04/05/12 0314  Arman Filter, NP 04/05/12 [phone]

## 2012-04-06 ENCOUNTER — Ambulatory Visit: Payer: Medicare Other | Admitting: Physical Medicine & Rehabilitation

## 2012-04-06 ENCOUNTER — Encounter: Payer: Medicare Other | Attending: Physical Medicine & Rehabilitation

## 2012-04-06 DIAGNOSIS — I10 Essential (primary) hypertension: Secondary | ICD-10-CM

## 2012-04-06 DIAGNOSIS — E119 Type 2 diabetes mellitus without complications: Secondary | ICD-10-CM

## 2012-04-06 DIAGNOSIS — I951 Orthostatic hypotension: Principal | ICD-10-CM

## 2012-04-06 DIAGNOSIS — I6350 Cerebral infarction due to unspecified occlusion or stenosis of unspecified cerebral artery: Secondary | ICD-10-CM

## 2012-04-06 DIAGNOSIS — R7989 Other specified abnormal findings of blood chemistry: Secondary | ICD-10-CM

## 2012-04-06 LAB — COMPREHENSIVE METABOLIC PANEL
ALT: 18 U/L (ref 0–35)
AST: 24 U/L (ref 0–37)
CO2: 25 mEq/L (ref 19–32)
Chloride: 104 mEq/L (ref 96–112)
Creatinine, Ser: 0.84 mg/dL (ref 0.50–1.10)
GFR calc Af Amer: 86 mL/min — ABNORMAL LOW (ref >90)
GFR calc non Af Amer: 74 mL/min — ABNORMAL LOW (ref >90)
Glucose, Bld: 92 mg/dL (ref 70–99)
Sodium: 135 mEq/L (ref 135–145)
Total Bilirubin: 0.2 mg/dL — ABNORMAL LOW (ref 0.3–1.2)

## 2012-04-06 LAB — CBC
Hemoglobin: 9.6 g/dL — ABNORMAL LOW (ref 12.0–15.0)
MCH: 29.4 pg (ref 26.0–34.0)
MCV: 89 fL (ref 78.0–100.0)
Platelets: 139 10*3/uL — ABNORMAL LOW (ref 150–400)
RBC: 3.27 MIL/uL — ABNORMAL LOW (ref 3.87–5.11)
WBC: 5.7 10*3/uL (ref 4.0–10.5)

## 2012-04-06 LAB — GLUCOSE, CAPILLARY
Glucose-Capillary: 111 mg/dL — ABNORMAL HIGH (ref 70–99)
Glucose-Capillary: 84 mg/dL (ref 70–99)

## 2012-04-06 LAB — T4, FREE: Free T4: 1.27 ng/dL (ref 0.80–1.80)

## 2012-04-06 LAB — CORTISOL: Cortisol, Plasma: 12.6 ug/dL

## 2012-04-06 MED ORDER — SODIUM CHLORIDE 0.9 % IV SOLN: INTRAVENOUS | Status: DC

## 2012-04-06 MED ORDER — HYDRALAZINE HCL 20 MG/ML IJ SOLN: 5.0000 mg | INTRAMUSCULAR | Status: DC | PRN

## 2012-04-06 MED ORDER — GABAPENTIN 300 MG PO CAPS
300.0000 mg | ORAL_CAPSULE | Freq: Every day | ORAL | Status: DC
  Administered 2012-04-06: 300 mg via ORAL
  Filled 2012-04-06 (×2): qty 1

## 2012-04-06 MED ORDER — FERROUS SULFATE 325 (65 FE) MG PO TABS
325.0000 mg | ORAL_TABLET | Freq: Every day | ORAL | Status: DC
  Administered 2012-04-07: 325 mg via ORAL
  Filled 2012-04-06 (×2): qty 1

## 2012-04-06 MED ORDER — IPRATROPIUM BROMIDE 0.02 % IN SOLN: 0.5000 mg | RESPIRATORY_TRACT | Status: DC | PRN

## 2012-04-06 MED ORDER — ALBUTEROL SULFATE (5 MG/ML) 0.5% IN NEBU: 2.5000 mg | INHALATION_SOLUTION | RESPIRATORY_TRACT | Status: DC | PRN

## 2012-04-06 MED ORDER — SODIUM CHLORIDE 0.9 % IV BOLUS (SEPSIS)
500.0000 mL | Freq: Once | INTRAVENOUS | Status: AC
  Administered 2012-04-06: 500 mL via INTRAVENOUS

## 2012-04-06 MED ORDER — SODIUM CHLORIDE 0.9 % IV BOLUS (SEPSIS)
1000.0000 mL | Freq: Once | INTRAVENOUS | Status: AC
  Administered 2012-04-06: 1000 mL via INTRAVENOUS

## 2012-04-06 NOTE — Evaluation (Signed)
Occupational Therapy Evaluation Patient Details Name: Chelsey Gallagher MRN: [id number] DOB: [birth year] Today's Date: 04/06/2012 Time: 4098-1191 OT Time Calculation (min): 19 min  OT Assessment / Plan / Recommendation Clinical Impression  This 62 year old female was admitted with weakness.  She has a PMH of CVA and came from [facility].  Will follow in acute for sitting balance activities as pt does not have AFO/shoe here.  Recommend that OT at [facility] screen her and provide services if indicated.  Also, splint should be checked for fit.      OT Assessment  Patient needs continued OT Services    Follow Up Recommendations  SNF    Barriers to Discharge      Equipment Recommendations       Recommendations for Other Services    Frequency  Min 2X/week    Precautions / Restrictions Precautions Precautions: Fall Precaution Comments: check orthostatics Restrictions Weight Bearing Restrictions: No   Pertinent Vitals/Pain No pain.  Pt with orthostatic hypotension:  See vital signs 900-902 am    ADL  Grooming: Min guard Where Assessed - Grooming: Unsupported sitting Upper Body Bathing: Min guard Where Assessed - Upper Body Bathing: Unsupported sitting Lower Body Bathing: Minimal assistance Where Assessed - Lower Body Bathing: Supported sit to stand Upper Body Dressing: Minimal assistance Where Assessed - Upper Body Dressing: Unsupported sitting Lower Body Dressing: Minimal assistance Where Assessed - Lower Body Dressing: Supported sit to stand (for balance) Toileting - Clothing Manipulation and Hygiene: Minimal assistance Where Assessed - Toileting Clothing Manipulation and Hygiene: Sit to stand from 3-in-1 or toilet Transfers/Ambulation Related to ADLs: sit to stand only.  Pt orthostatic:  see vital signs 900-902.  Pt was asymptomatic.  Pt does not have brace/shoe and R ankle turns:  did not feel safe to transfer ADL Comments: Pt is able to cross legs for adls.  She needed  balance support for adls.      OT Diagnosis: Generalized weakness  OT Problem List: Decreased strength;Impaired balance (sitting and/or standing);Decreased activity tolerance;Impaired UE functional use OT Treatment Interventions: Balance training;Therapeutic activities;Self-care/ADL training;Patient/family education   OT Goals Acute Rehab OT Goals OT Goal Formulation: With patient Time For Goal Achievement: 04/20/12 Potential to Achieve Goals: Good Miscellaneous OT Goals Miscellaneous OT Goal #1: Pt will maintain unsupported balance sitting eob for adls with supervision x 5 minutes OT Goal: Miscellaneous Goal #1 - Progress: Goal set today  Visit Information  Last OT Received On: 04/06/12 Assistance Needed: +1 (sit to stand)    Subjective Data  Subjective: I wear a brace at night   Prior Functioning     Home Living Available Help at Discharge: Skilled Nursing Facility Prior Function Level of Independence: Needs assistance Needs Assistance: Gait;Transfers Comments: pt states she does adls on her own with set up; she is able to stand when shoes are on.  She transfers into w/c at SNF Communication Communication: No difficulties         Vision/Perception     Cognition  Cognition Overall Cognitive Status: Appears within functional limits for tasks assessed/performed Arousal/Alertness: Awake/alert Orientation Level: Appears intact for tasks assessed Behavior During Session: [facility] for tasks performed    Extremity/Trunk Assessment Right Upper Extremity Assessment RUE ROM/Strength/Tone: Deficits RUE ROM/Strength/Tone Deficits: PROM to 90 shoulder, elbow tight in flexors but able to stretch fully.  Wrist and fingers have tight flexors and shortening.  Pt wears a splint at the SNF Left Upper Extremity Assessment LUE ROM/Strength/Tone: Within functional levels  Mobility Bed Mobility Supine to Sit: 4: Min assist;With rails;HOB elevated     Exercise     Balance Static  Sitting Balance Static Sitting - Level of Assistance: 5: Stand by assistance Static Sitting - Comment/# of Minutes: initially off balance, then min guard Static Standing Balance Static Standing - Balance Support: Left upper extremity supported Static Standing - Level of Assistance: 4: Min assist   End of Session OT - End of Session Activity Tolerance: Patient tolerated treatment well Patient left: in bed;with call bell/phone within reach;with bed alarm set  GO     Greyson Peavy 04/06/2012, 11:26 AM Marica Otter, OTR/L [phone] 04/06/2012

## 2012-04-06 NOTE — Progress Notes (Signed)
Pt is very lethargic with BP 62/38. Speech is slurred. Patient able to answer all questions appropriately. NP, Clydie Braun, notified and came to assess patient. NP ordered 1000 bolus NS. She notified RN to call her for an updated BP after the bolus infused.

## 2012-04-06 NOTE — Plan of Care (Signed)
Problem: Phase III Progression Outcomes Goal: Voiding independently Pt experiencing urinary retention, has foley in currently

## 2012-04-06 NOTE — Progress Notes (Addendum)
Shift event:  RN paged me because after she gave prn Hydralazine for a high BP, next BP was in the 60s. Apparently, pt was hypotensive upon admission and home BP meds have been held with use of prns only. She has been difficult to control as well.  NP to bedside. Pt is easily arousable and denies any chest pain, SOB or dizziness. Will give bolus now and cont to monitor BP. RN to recheck after bolus and let me know. Will change parameters on prn Hydralazine to be given for higher BP and dose changed to 5mg  from 10mg .  Jimmye Norman, NP [facility] Addendum: After 1500 cc total NS bolus, pt's BP is in the 80s. Will cont to monitor.  KKG, NP

## 2012-04-06 NOTE — Clinical Social Work Note (Signed)
CSW following for return to SNF. ICU CSW provided report to [facility] CSW, Unice Bailey who will follow for return to [place].   Doreen Salvage, LCSW ICU/Stepdown Clinical Social Worker [facility] Cell [phone] Hours 8am-1200pm M-F

## 2012-04-06 NOTE — Progress Notes (Signed)
[facility] PROGRESS NOTE  Chelsey Gallagher WUJ:[id number] DOB: 1950-07-06 DOA: 04/04/2012 PCP: Betsey Holiday, MD  Assessment/Plan  Chelsey Gallagher is a 62 y.o. year old female presenting with weakness initially attributed to recent GI illness.  She was hydrated and her blood pressure improved, however, now, despite hydration, she continues to have severe orthostatic hypoTN.  See vital signs from 04/06/12 around 9AM.    Orthostatic hypotension.  Hgb is stable and no evidence of active bleeding.  DDx of severe orthostasis:  May be related to autonomic dysfunction from CVA, diabetes, endocrinopathy, medication side effect.   -  ECHO 05/2011:  Normal LV size with moderate LV hypertrophy. EF 65-70%.  Aortic valve was severely calcified with mild to moderate stenosis.  Nl RV.  Pulmonary edema on CXR -  Repeat ECHO -  TSH was low, fT4 pending -  Mid-morning cortisol level pending  -  Serum metanephrines (will need urine test if positive) -  Patient is on several medications which can contribute to hypoTN:  Gabapentin, celexa, tizanidine and all may be contributing, but tizanidine probably contributing most.   -  Halve gabapentin  -  D/c tizanidine  Anemia:  Suspect is a likely acute on chronic blood loss.  -  Agree with holding aspirin, continue plavix -  Repeat CBC at 10am, if hgb stable, may transfer to telemetry -  Follow up TSH 0.06, B12 289, folate 18.6, iron panel c/w iron deficiency.   -  SPEP pending -  MMA with AM labs -  Start iron    Hyponatremia: Hypovolemic in setting of gastroenteritis and resolved with hydration Elevated BUN:  No evidence of of hemolysis on exam and bilirubin is normal.  Occult stool negative. Trended down with hydration.    Gastroenteritis:  Improved. No vomiting or diarrhea in > 5 days -  No diarrhea or nausea recently, resolving.   -  D/c contact precautions  Cough and desaturations to low 80s and high 70s with good waveform:  May be due to viral illness,  however, may also have component of COPD given smoking history.  May have some sleep apnea.   -  CXR c/w vascular congestion -  Duonebs -  IS  Epistaxis:   -  Cold compresses prn -  Afrin for refractory -  Saline and vaseline to nares  CVA: On aspirin and Plavix with concomitant anemia (concern for acute vs. Sub acute blood loss). HAS-BLED score of 3-4.  -  Hold aspirin and continue Plavix.  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  A1c 5.3   -  Continue SSI.  -  D/c lantus -  Hold metformin  Diet:  Heart healthy, carb modified diet.  Access:  PIV IVF:  OFF Proph:  SCDs  Code Status: full code Family Communication: spoke with patient alone Disposition Plan: pending further evaluation and improvement in orthostatics.  Consultants:  none  Procedures:  none  Antibiotics:  none   HPI/Subjective:  Patient states that she is feeling better but had an episode of severe lightheadedness and malaise overnight with hypoTN to 62/38.  Her nausea, vomiting, and diarrhea have resolved.  Denies chest pain, but has some mild cough.  Denies SOB, wheeze.  Has baseline urinary incontinence.  No BM since Thursday (had diarrhea and poor Po intake since that time)  Objective: Filed Vitals:   04/06/12 0901 04/06/12 0902 04/06/12 1057 04/06/12 1330  BP: 161/54 87/48 178/65 168/67  Pulse:    80  Temp:  98.5 F (36.9 C)  TempSrc:    Oral  Resp:    17  Height:      Weight:      SpO2:    99%    Intake/Output Summary (Last 24 hours) at 04/06/12 1450 Last data filed at 04/06/12 1300  Gross per 24 hour  Intake   1500 ml  Output   1725 ml  Net   -225 ml   Filed Weights   04/05/12 0330 04/06/12 0601  Weight: 64.3 kg (141 lb 12.1 oz) 64.4 kg (141 lb 15.6 oz)    Exam:   General:  Caucasian female, sitting up in bed hard of hearing  HEENT:  Pallor of MM, MMM  Cardiovascular:  RRR, 2/6 murmur at the LSB and apex, no rubs or gallops, 2+ pulses, warm extremities  Respiratory:  Course  bilateral breath sounds.  No obvious wheeze or focal rales.  No increased WOB.    Abdomen:  NABS, soft, abdominal wall hernia, nontender, nondistended  MSK:  Decreased tone and bulk of right extremities with contractures of arm and hand.  NO LEE.    Data Reviewed: Basic Metabolic Panel:  Lab 04/06/12 9147 04/05/12 0345 04/04/12 2144  NA 135 133* 129*  K 4.3 4.3 4.2  CL 104 101 97  CO2 25 23 22   GLUCOSE 92 87 160*  BUN 28* 48* 48*  CREATININE 0.84 0.93 0.96  CALCIUM 9.0 8.9 8.9  MG -- -- --  PHOS -- -- --   Liver Function Tests:  Lab 04/06/12 0430 04/05/12 0345 04/04/12 2144  AST 24 21 21   ALT 18 16 16   ALKPHOS 62 60 63  BILITOT 0.2* 0.1* 0.1*  PROT 6.3 5.9* 6.1  ALBUMIN 3.1* 3.0* 3.1*   No results found for this basename: LIPASE:5,AMYLASE:5 in the last 168 hours No results found for this basename: AMMONIA:5 in the last 168 hours CBC:  Lab 04/06/12 0430 04/05/12 1016 04/05/12 0345 04/04/12 2144  WBC 5.7 6.6 5.7 6.3  NEUTROABS -- -- -- 4.0  HGB 9.6* 9.1* 8.7* 8.4*  HCT 29.1* 28.0* 26.6* 25.4*  MCV 89.0 88.3 88.1 87.6  PLT 139* 136* 127* 124*   Cardiac Enzymes: No results found for this basename: CKTOTAL:5,CKMB:5,CKMBINDEX:5,TROPONINI:5 in the last 168 hours BNP (last 3 results) No results found for this basename: PROBNP:3 in the last 8760 hours CBG:  Lab 04/06/12 1155 04/06/12 0756 04/05/12 2217 04/05/12 1711 04/05/12 1126  GLUCAP 120* 84 99 111* 99    Recent Results (from the past 240 hour(s))  MRSA PCR SCREENING     Status: Normal   Collection Time   04/05/12  3:23 AM      Component Value Range Status Comment   MRSA by PCR NEGATIVE  NEGATIVE Final      Studies: Dg Chest Port 1 View  04/05/2012  *RADIOLOGY REPORT*  Clinical Data: Decreased O2 sats.  PORTABLE CHEST - 1 VIEW  Comparison: 05/08/2011  Findings: 0904 hours.  The cardiopericardial silhouette is enlarged. Interstitial markings are diffusely coarsened with chronic features.  Vascular congestion  noted without overt airspace pulmonary edema.  No pleural effusion. Telemetry leads overlie the chest. Imaged bony structures of the thorax are intact.  IMPRESSION: Cardiomegaly with emphysema and mild vascular congestion.   Original Report Authenticated By: Kennith Center, M.D.     Scheduled Meds:    . citalopram  20 mg Oral Daily  . clopidogrel  75 mg Oral Q breakfast  . dicyclomine  10 mg Oral  TID AC & HS  . gabapentin  600 mg Oral QHS  . insulin aspart  0-9 Units Subcutaneous TID WC  . insulin glargine  7.5 Units Subcutaneous QHS  . lisinopril  20 mg Oral BID  . oxybutynin  2.5 mg Oral BID  . simvastatin  20 mg Oral q1800  . tiZANidine  4 mg Oral QHS   Continuous Infusions:   Active Problems:  * No active hospital problems. *     Time spent: 30 min    Evianna Chandran, [facility]  [facility] Pager [phone]. If 7PM-7AM, please contact night-coverage at [email or web address], password [facility] 04/06/2012, 2:50 PM  LOS: 2 days

## 2012-04-06 NOTE — Progress Notes (Signed)
After 2nd bolus of , pt's BP came up to 81/40. Before 2nd bolus, NP, Moni stated she wanted BP to come up into the 80s. RN will continue to monitor.

## 2012-04-07 DIAGNOSIS — I359 Nonrheumatic aortic valve disorder, unspecified: Secondary | ICD-10-CM

## 2012-04-07 DIAGNOSIS — R6889 Other general symptoms and signs: Secondary | ICD-10-CM

## 2012-04-07 LAB — CBC
HCT: 33.9 % — ABNORMAL LOW (ref 36.0–46.0)
Hemoglobin: 11.3 g/dL — ABNORMAL LOW (ref 12.0–15.0)
MCH: 29 pg (ref 26.0–34.0)
MCHC: 33.3 g/dL (ref 30.0–36.0)

## 2012-04-07 LAB — COMPREHENSIVE METABOLIC PANEL
Alkaline Phosphatase: 79 U/L (ref 39–117)
BUN: 18 mg/dL (ref 6–23)
Calcium: 9.8 mg/dL (ref 8.4–10.5)
GFR calc Af Amer: >90 mL/min (ref >90)
GFR calc non Af Amer: >90 mL/min (ref >90)
Glucose, Bld: 120 mg/dL — ABNORMAL HIGH (ref 70–99)
Total Protein: 7.4 g/dL (ref 6.0–8.3)

## 2012-04-07 LAB — PROTEIN ELECTROPHORESIS, SERUM
Albumin ELP: 55.9 % (ref 55.8–66.1)
Alpha-1-Globulin: 4.9 % (ref 2.9–4.9)
Alpha-2-Globulin: 11.1 % (ref 7.1–11.8)
Beta 2: 4.8 % (ref 3.2–6.5)
Gamma Globulin: 16.8 % (ref 11.1–18.8)

## 2012-04-07 LAB — GLUCOSE, CAPILLARY
Glucose-Capillary: 115 mg/dL — ABNORMAL HIGH (ref 70–99)
Glucose-Capillary: 138 mg/dL — ABNORMAL HIGH (ref 70–99)

## 2012-04-07 MED ORDER — GABAPENTIN 600 MG PO TABS: 300.0000 mg | ORAL_TABLET | Freq: Every day | ORAL | Status: DC

## 2012-04-07 NOTE — ED Provider Notes (Signed)
Medical screening examination/treatment/procedure(s) were conducted as a shared visit with non-physician practitioner(s) and myself.  I personally evaluated the patient during the encounter.  Patient presented to the ER for evaluation of nosebleeds. She has had some nausea vomiting and diarrhea as well. Patient exhibiting orthostatic hypotension, admitted to the hospital.  Gilda Crease, MD 04/07/12 [phone]

## 2012-04-07 NOTE — Progress Notes (Signed)
  Echocardiogram 2D Echocardiogram has been performed.  Chelsey Gallagher 04/07/2012, 8:27 AM 

## 2012-04-07 NOTE — Progress Notes (Signed)
Patient will be returning to [facility] when ready. Anticipating discharge today. Patient's daughter, Shanda Bumps (cell#: [phone]) aware. Patient will be transported via [facility].   Unice Bailey, LCSW [facility] Clinical Social Worker cell #: [phone]

## 2012-04-07 NOTE — Progress Notes (Signed)
Physical Therapy Treatment Patient Details Name: Chelsey Gallagher MRN: [id number] DOB: [birth year] Today's Date: 04/07/2012 Time: 6213-0865 PT Time Calculation (min): 30 min  PT Assessment / Plan / Recommendation Comments on Treatment Session  Pt assisted to [facility] for BM and then performed w/c mobility.  Pt currently min assist however reports she would do better with shoe and brace (currently at SNF).  Pt also reports using hemiwalker with transfers.  Pt would benefit from ST-SNF to improve strength and work on ambulation upon d/c.  Pt reports she is attempting to look for apt handicap accessible and is having difficulty.    Follow Up Recommendations  Supervision for mobility/OOB;SNF     Does the patient have the potential to tolerate intense rehabilitation     Barriers to Discharge        Equipment Recommendations  None recommended by PT    Recommendations for Other Services    Frequency     Plan Discharge plan needs to be updated;Frequency remains appropriate    Precautions / Restrictions Precautions Precautions: Fall Required Braces or Orthoses: Other Brace/Splint Other Brace/Splint: pt reports she has a brace and shoe at the SNF   Pertinent Vitals/Pain No pain    Mobility  Bed Mobility Bed Mobility: Supine to Sit Supine to Sit: HOB elevated;5: Supervision;With rails Transfers Transfers: Sit to Stand;Stand to Sit;Stand Pivot Transfers Sit to Stand: 4: Min assist;With armrests;With upper extremity assist Stand to Sit: 4: Min assist;With armrests;With upper extremity assist Stand Pivot Transfers: 4: Min assist Details for Transfer Assistance: pt transfers to L side and reaches for armrest, min assist today but reports she wouldn't need assist if she had her shoe and brace, performed transfer x4 for use of BSC and then w/c mobility Ambulation/Gait Ambulation/Gait Assistance: Not tested (comment) Wheelchair Mobility Wheelchair Mobility: Yes Wheelchair Assistance: 4: Chief Financial Officer Details (indicate cue type and reason): verbal cues for avoiding bumping wall on L side, pt propels with L UE and L LE, also uses hand rail to pull and roll, stated good idea to conserve energy if hand rail available, assisted with brakes and R leg rest, assist with turning onto different surface otherwise supervision Wheelchair Propulsion: Left upper extremity;Left lower extremity Wheelchair Parts Management: Supervision/cueing;Needs assistance Distance: 120 feet    Exercises     PT Diagnosis:    PT Problem List:   PT Treatment Interventions:     PT Goals Acute Rehab PT Goals PT Goal Formulation: With patient Time For Goal Achievement: 04/14/12 Potential to Achieve Goals: Good Pt will go Supine/Side to Sit: with modified independence PT Goal: Supine/Side to Sit - Progress: Progressing toward goal Pt will Transfer Bed to Chair/Chair to Bed: with supervision PT Transfer Goal: Bed to Chair/Chair to Bed - Progress: Progressing toward goal  Visit Information  Last PT Received On: 04/07/12 Assistance Needed: +1    Subjective Data  Subjective: I need to go to the bathroom.   Cognition  Cognition Overall Cognitive Status: Appears within functional limits for tasks assessed/performed Arousal/Alertness: Awake/alert Orientation Level: Appears intact for tasks assessed Behavior During Session: [facility] for tasks performed    Balance     End of Session PT - End of Session Activity Tolerance: Patient tolerated treatment well Patient left: in chair;with call bell/phone within reach   GP     Ludella Pranger,KATHrine E 04/07/2012, 10:29 AM Zenovia Jarred, PT, DPT 04/07/2012 Pager: [phone]

## 2012-04-07 NOTE — Discharge Summary (Addendum)
Physician Discharge Summary  ANITRA DOXTATER JXB:[id number] DOB: 04/08/1950 DOA: 04/04/2012  PCP: Betsey Holiday, MD  Admit date: 04/04/2012 Discharge date: 04/07/2012  Recommendations for Outpatient Follow-up:  1. Pt will need to follow up with PCP in 2-3 weeks post discharge 2. Please obtain BMP to evaluate electrolytes and kidney function 3. Please also check CBC to evaluate Hg and Hct levels 4. Please note that TSH was low and free T4 normal, the TSH will have to be repeated in 6 weeks 5. Serum metanephrines test pending at the time of the discharge, will need to follow up on results, if positive will need urine metanephrine test 6. Please note that Zanaflex was discontinued and Gabapentin dose was halved to 300 mg QHS as both were considered to contribute to orthostatic hypotension   Discharge Diagnoses: Orthostatic hypotension  Principal Problem:  *Orthostatic hypotension Active Problems:  CVA (cerebral infarction)  Hypertension  Diabetes mellitus  Hyperlipidemia  Tobacco abuse  Abnormal TSH  Discharge Condition: Stable  Diet recommendation: Heart healthy diet discussed in details   BRIEF HPI: Chelsey Gallagher is a 62 y.o. year old female presenting with weakness initially attributed to recent GI illness. She was hydrated and her blood pressure improved, however, despite hydration, she continued to have severe orthostatic hypoTN.  HOSPITAL COURSE BY PROBLEM: Orthostatic hypotension.  - Hgb is stable and no evidence of active bleeding. DDx of severe orthostasis: May be related to autonomic dysfunction from CVA, diabetes, endocrinopathy, medication side effect.  - ECHO 05/2011: Normal LV size with moderate LV hypertrophy. EF 65-70%. Aortic valve was severely calcified with mild to moderate stenosis. Nl RV. - Repeat ECHO 04/06/2012: LV size normal, severe LVH. EF 65% to 70%, grade 1 diastolic dysfunction, Aortic valve moderate stenosis.  - TSH was low, fT4 normal, will need to be  repeated in 6 weeks  - Serum metanephrines pending (will need urine test if positive)  - Patient is on several medications which can contribute to hypoTN: Gabapentin, celexa, tizanidine and all may be contributing, but tizanidine probably contributing most.  - Halve gabapentin  - D/c tizanidine  Anemia:  - Suspect is a likely acute on chronic blood loss.  - Hg and Hct stable and improved - continue Aspirin and plavix Hyponatremia:  - Hypovolemic in setting of gastroenteritis and resolved with hydration  - Elevated BUN: No evidence of of hemolysis on exam and bilirubin is normal. Occult stool negative. Trended down with hydration.  Gastroenteritis:  - Improved. No vomiting or diarrhea in > 5 days  - D/c contact precautions  Cough and desaturations to low 80s and high 70s with good waveform:  - May be due to viral illness, however, may also have component of COPD given smoking history. May have some sleep apnea.  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 A1c 5.3  - will continue home medication regimen   Procedures/Studies: Dg Chest Port 1 View 04/05/2012    Cardiomegaly with emphysema and mild vascular congestion.     Consultations:  None  Antibiotics:  None  Discharge Exam: Filed Vitals:   04/07/12 0916  BP: 106/66  Pulse:   Temp:   Resp:  Filed Vitals:   04/06/12 2026 04/06/12 2259 04/07/12 0512 04/07/12 0916  BP: 129/78 110/85 133/83 106/66  Pulse: 95 80 85   Temp: 98.1 F (36.7 C)  98.4 F (36.9 C)   TempSrc: Oral  Oral   Resp: 18  18   Height:      Weight:   61.6 kg (135 lb 12.9 oz)   SpO2: 95%  97%     General: Pt is alert,  follows commands appropriately, not in acute distress Cardiovascular: Regular rate and rhythm, S1/S2 +, SEm 2/6, no gallops Respiratory: Clear to auscultation bilaterally, no wheezing, no crackles, no rhonchi Abdominal: Soft, non tender, non distended, bowel sounds +, no guarding Extremities: no edema, no cyanosis, pulses palpable bilaterally DP and PT  Discharge Instructions  Discharge Orders    Future Appointments: Provider: Department: Dept Phone: Center:   04/13/2012 10:00 AM [phone] None     Future Orders Please Complete By Expires   Diet - low sodium heart healthy      Increase activity slowly          Medication List STOP TAKING ZANAFLEX DECREASE THE DOSE OF GABAPENTIN FROM 600 TO 300 MG QHS     As of 04/07/2012 11:27 AM    TAKE these medications         aspirin 325 MG buffered tablet   Take 325 mg by mouth daily.      citalopram 20 MG tablet   Commonly known as: CELEXA   Take 20 mg by mouth daily.      clopidogrel 75 MG tablet   Commonly known as: PLAVIX   Take 75 mg by mouth daily.      dicyclomine 10 MG capsule   Commonly known as: BENTYL   Take 10 mg by mouth 3 (three) times daily before meals.      docusate sodium 100 MG capsule   Commonly known as: COLACE   Take 100 mg by mouth daily as needed. For constipation.      gabapentin 300 MG tablet   Commonly known as: NEURONTIN   Take 300 mg by mouth at bedtime.      insulin glargine 100 UNIT/ML injection   Commonly known as: LANTUS   Inject 15 Units into the skin at bedtime.      lisinopril 20 MG tablet   Commonly known as: PRINIVIL,ZESTRIL   Take 20 mg by mouth 2 (two) times daily.      metFORMIN 500 MG tablet   Commonly known as: GLUCOPHAGE   Take 500 mg by mouth 2 (two) times daily with a meal.      pravastatin 40 MG tablet   Commonly known as: PRAVACHOL   Take 40 mg by mouth daily.      sodium chloride 0.65 % nasal spray   Commonly  known as: OCEAN   Place 1 spray into the nose as needed. For nose bleeds.         white petrolatum Gel   Commonly known as: VASELINE   Apply 1 application topically daily. Apply to toenails           Follow-up Information    Follow up with DAVIS,JOHN F, MD. In 2 weeks.   Contact information:   [address] [place] [address] [phone]           The results of significant diagnostics from this hospitalization (including imaging, microbiology, ancillary and laboratory) are  listed below for reference.     Microbiology: Recent Results (from the past 240 hour(s))  MRSA PCR SCREENING     Status: Normal   Collection Time   04/05/12  3:23 AM      Component Value Range Status Comment   MRSA by PCR NEGATIVE  NEGATIVE Final      Labs: Basic Metabolic Panel:  Lab 04/07/12 1610 04/06/12 0430 04/05/12 0345 04/04/12 2144  NA 135 135 133* 129*  K 3.7 4.3 4.3 4.2  CL 100 104 101 97  CO2 26 25 23 22   GLUCOSE 120* 92 87 160*  BUN 18 28* 48* 48*  CREATININE 0.62 0.84 0.93 0.96  CALCIUM 9.8 9.0 8.9 8.9  MG -- -- -- --  PHOS -- -- -- --   Liver Function Tests:  Lab 04/07/12 0435 04/06/12 0430 04/05/12 0345 04/04/12 2144  AST 22 24 21 21   ALT 19 18 16 16   ALKPHOS 79 62 60 63  BILITOT 0.3 0.2* 0.1* 0.1*  PROT 7.4 6.3 5.9* 6.1  ALBUMIN 3.7 3.1* 3.0* 3.1*   CBC:  Lab 04/07/12 0435 04/06/12 0430 04/05/12 1016 04/05/12 0345 04/04/12 2144  WBC 7.1 5.7 6.6 5.7 6.3  NEUTROABS -- -- -- -- 4.0  HGB 11.3* 9.6* 9.1* 8.7* 8.4*  HCT 33.9* 29.1* 28.0* 26.6* 25.4*  MCV 87.1 89.0 88.3 88.1 87.6  PLT 178 139* 136* 127* 124*   CBG:  Lab 04/07/12 0734 04/06/12 2200 04/06/12 1626 04/06/12 1155 04/06/12 0756  GLUCAP 115* 103* 96 120* 84     SIGNED: Time coordinating discharge: Over 30 minutes  Debbora Presto, MD  [facility] 04/07/2012, 11:27 AM Pager [phone]  If 7PM-7AM, please contact night-coverage [email or web address] Password [email or web address]

## 2012-04-07 NOTE — Progress Notes (Deleted)
  Echocardiogram 2D Echocardiogram has been performed.  Chelsey Gallagher 04/07/2012, 8:27 AM

## 2012-04-08 LAB — METHYLMALONIC ACID, SERUM: Methylmalonic Acid, Quantitative: 0.25 umol/L (ref <0.40)

## 2012-04-09 LAB — METANEPHRINES, PLASMA: Normetanephrine, Free: 71 pg/mL (ref <=148)

## 2012-04-13 ENCOUNTER — Encounter: Payer: Self-pay | Admitting: Neurology

## 2012-04-13 ENCOUNTER — Ambulatory Visit: Payer: Medicare Other | Admitting: Physical Medicine & Rehabilitation

## 2012-04-13 DIAGNOSIS — I6350 Cerebral infarction due to unspecified occlusion or stenosis of unspecified cerebral artery: Secondary | ICD-10-CM | POA: Insufficient documentation

## 2012-04-13 DIAGNOSIS — I6529 Occlusion and stenosis of unspecified carotid artery: Secondary | ICD-10-CM | POA: Insufficient documentation

## 2012-05-10 ENCOUNTER — Other Ambulatory Visit: Payer: Self-pay | Admitting: Radiology

## 2012-05-11 ENCOUNTER — Telehealth (HOSPITAL_COMMUNITY): Payer: Self-pay | Admitting: Family Medicine

## 2012-05-18 ENCOUNTER — Telehealth (HOSPITAL_COMMUNITY): Payer: Self-pay | Admitting: Interventional Radiology

## 2012-05-19 ENCOUNTER — Ambulatory Visit: Payer: Self-pay | Admitting: Neurology

## 2012-06-24 ENCOUNTER — Non-Acute Institutional Stay (SKILLED_NURSING_FACILITY): Payer: Medicare Other | Admitting: Nurse Practitioner

## 2012-06-24 ENCOUNTER — Encounter: Payer: Self-pay | Admitting: Nurse Practitioner

## 2012-06-24 DIAGNOSIS — I6350 Cerebral infarction due to unspecified occlusion or stenosis of unspecified cerebral artery: Secondary | ICD-10-CM

## 2012-06-24 DIAGNOSIS — I10 Essential (primary) hypertension: Secondary | ICD-10-CM

## 2012-06-24 DIAGNOSIS — I639 Cerebral infarction, unspecified: Secondary | ICD-10-CM

## 2012-06-24 DIAGNOSIS — E119 Type 2 diabetes mellitus without complications: Secondary | ICD-10-CM

## 2012-06-24 DIAGNOSIS — I951 Orthostatic hypotension: Secondary | ICD-10-CM

## 2012-06-24 DIAGNOSIS — E785 Hyperlipidemia, unspecified: Secondary | ICD-10-CM

## 2012-06-24 DIAGNOSIS — N390 Urinary tract infection, site not specified: Secondary | ICD-10-CM

## 2012-06-24 NOTE — Progress Notes (Signed)
Patient ID: Chelsey Gallagher, female   DOB: 1951-02-05, 62 y.o.   MRN: [id number]   PCP: Betsey Holiday, MD  Code Status: fullcode  Allergies  Allergen Reactions  . Codeine Other (See Comments)    Per Mar  . Flexeril (Cyclobenzaprine Hcl) Other (See Comments)    Per Mar  . Lyrica (Pregabalin) Other (See Comments)    "wires my up"    Chief Complaint: follow up hospitalization  HPI:   Chelsey Gallagher is a 62 year old female with a PMH of HTN, CVA, hyperlipidemia, and frequent UTI.  Pt was admitted to [facility] due to multiple falls at home and found to have an UTI. She was treated and discharged to [facility] for therapy and long term care. Pt previously a resident of [facility] and was discharge home with family but realized she would not longer be able to live there without a lot more assistance. Current pt is without complaints and staff has no concerns. She is able to self propel in her wheelchiar and very active.  Review of Systems:  Review of Systems  Constitutional: Negative for fever, chills and malaise/fatigue.  Eyes: Negative for blurred vision and double vision.  Respiratory: Negative for cough and sputum production.   Cardiovascular: Negative for chest pain and leg swelling.  Gastrointestinal: Negative for heartburn, abdominal pain, diarrhea and constipation.  Genitourinary: Negative for dysuria.  Musculoskeletal: Negative for myalgias.  Neurological: Negative for weakness and headaches. Dizziness: at times - has orthostatic hypotension.  Psychiatric/Behavioral: Negative for depression. The patient is not nervous/anxious.      Past Medical History  Diagnosis Date  . Diabetes mellitus   . Hypertension   . Depression   . PAD (peripheral artery disease)     S/p bypass grafting, unclear exactly where  . Nephrolithiasis   . Hyperlipidemia 05/14/2011  . Hearing loss   . Complication of anesthesia     doesn't wake up good- "usually end up in ICU"  . PONV  (postoperative nausea and vomiting)   . Orthostatic hypotension     after surgery.  Marland Kitchen Heart murmur     PCP- [facility]- No tx needed.  . Stroke 05/2011    Weakness on Right. Wears leg brace.  . Foot drop, left   . H/O hiatal hernia     second one  . Neuromuscular disorder     Diabetic neuropathy- has inproved since she quit smoking.  . Arthritis    Past Surgical History  Procedure Laterality Date  . Total hip arthroplasty  2008    Right  . Abdominal hysterectomy  2003  . Cesarean section  1984  . Cholecystectomy  1984  . Bypass graft  2003    Abdominal aortic  . Kidney stone surgery  1999  . Appendectomy  2011  . Hernia repair  2011  . Lithotripsy    . Abdominal hysterectomy     Social History:   reports that she has quit smoking. Her smoking use included Cigarettes. She smoked 0.00 packs per day for 45 years. She quit smokeless tobacco use about 13 months ago. She reports that she does not drink alcohol or use illicit drugs.  Family History  Problem Relation Age of Onset  . Cancer Maternal Aunt     breast    Medications: Patient's Medications  New Prescriptions   No medications on file  Previous Medications   ASPIRIN 325 MG BUFFERED TABLET    Take 325 mg by mouth daily.  CITALOPRAM (CELEXA) 20 MG TABLET    Take 20 mg by mouth daily.   CLOPIDOGREL (PLAVIX) 75 MG TABLET    Take 75 mg by mouth daily.   DOCUSATE SODIUM (COLACE) 100 MG CAPSULE    Take 100 mg by mouth daily as needed. For constipation.   GABAPENTIN (NEURONTIN) 600 MG TABLET    Take 0.5 tablets (300 mg total) by mouth at bedtime.   INSULIN GLARGINE (LANTUS) 100 UNIT/ML INJECTION    Inject 8 Units into the skin at bedtime.    LISINOPRIL (PRINIVIL,ZESTRIL) 20 MG TABLET    Take 10 mg by mouth 2 (two) times daily.    MIDODRINE (PROAMATINE) 5 MG TABLET    Take 5 mg by mouth 3 (three) times daily.   PRAVASTATIN (PRAVACHOL) 40 MG TABLET    Take 40 mg by mouth daily.    SODIUM CHLORIDE (OCEAN) 0.65 %  NASAL SPRAY    Place 1 spray into the nose as needed. For nose bleeds.   WHITE PETROLATUM (VASELINE) GEL    Apply 1 application topically daily. Apply to toenails  Modified Medications   No medications on file  Discontinued Medications   DICYCLOMINE (BENTYL) 10 MG CAPSULE    Take 10 mg by mouth QID.    METFORMIN (GLUCOPHAGE) 500 MG TABLET    Take 500 mg by mouth 2 (two) times daily with a meal.   TIZANIDINE (ZANAFLEX) 4 MG TABLET    Take 4 mg by mouth.     Physical Exam:  Filed Vitals:   06/24/12 1725  BP: 166/87  Pulse: 72  Temp: 97.3 F (36.3 C)  Resp: 20   Physical Exam  Constitutional: She is oriented to person, place, and time. She appears well-developed and well-nourished. No distress.  HENT:  Head: Normocephalic and atraumatic.  Eyes: EOM are normal. Pupils are equal, round, and reactive to light. Right eye exhibits no discharge. Left eye exhibits no discharge.  Neck: Normal range of motion. Neck supple.  Cardiovascular: Normal rate, regular rhythm and normal heart sounds.   Pulmonary/Chest: Effort normal and breath sounds normal.  Abdominal: Soft. Bowel sounds are normal.  Musculoskeletal: She exhibits no edema and no tenderness.  Right sided weakness. Self propels in wc  Neurological: She is alert and oriented to person, place, and time.  Skin: Skin is warm and dry. She is not diaphoretic.  Psychiatric: She has a normal mood and affect.      Labs reviewed: Basic Metabolic Panel:  Recent Labs  30/86/57 0345 04/06/12 0430 04/07/12 0435  NA 133* 135 135  K 4.3 4.3 3.7  CL 101 104 100  CO2 23 25 26   GLUCOSE 87 92 120*  BUN 48* 28* 18  CREATININE 0.93 0.84 0.62  CALCIUM 8.9 9.0 9.8   Liver Function Tests:  Recent Labs  04/05/12 0345 04/06/12 0430 04/07/12 0435  AST 21 24 22   ALT 16 18 19   ALKPHOS 60 62 79  BILITOT 0.1* 0.2* 0.3  PROT 5.9* 6.3 7.4  ALBUMIN 3.0* 3.1* 3.7   No results found for this basename: LIPASE, AMYLASE,  in the last 8760  hours No results found for this basename: AMMONIA,  in the last 8760 hours CBC:  Recent Labs  08/29/11 1513 04/04/12 2144  04/05/12 1016 04/06/12 0430 04/07/12 0435  WBC 8.2 6.3  < > 6.6 5.7 7.1  NEUTROABS 5.0 4.0  --   --   --   --   HGB 13.7 8.4*  < >  9.1* 9.6* 11.3*  HCT 39.0 25.4*  < > 28.0* 29.1* 33.9*  MCV 90.1 87.6  < > 88.3 89.0 87.1  PLT 167 124*  < > 136* 139* 178  < > = values in this interval not displayed. Cardiac Enzymes: No results found for this basename: CKTOTAL, CKMB, CKMBINDEX, TROPONINI,  in the last 8760 hours BNP: No components found with this basename: POCBNP,  CBG:  Recent Labs  04/06/12 2200 04/07/12 0734 04/07/12 1152  GLUCAP 103* 115* 138*      Assessment/Plan Hypertension Patient is stable; continue current regimen. Will monitor and make changes as necessary.   Stroke Stable- cont asa and plavix  Diabetes mellitus Stable on lantus 8 units qhs  Hyperlipidemia Cont statin  orthostatic hypotension- remains stable on current medication   UTI-  To cont ceftin until 06/06/12 no current symptoms

## 2012-07-02 ENCOUNTER — Emergency Department (HOSPITAL_COMMUNITY)
Admission: EM | Admit: 2012-07-02 | Discharge: 2012-07-03 | Disposition: A | Discharge status: Home / Self Care | Payer: Medicare Other | Attending: Emergency Medicine | Admitting: Emergency Medicine

## 2012-07-02 ENCOUNTER — Non-Acute Institutional Stay (SKILLED_NURSING_FACILITY): Payer: Medicare Other | Admitting: Nurse Practitioner

## 2012-07-02 ENCOUNTER — Encounter (HOSPITAL_COMMUNITY): Payer: Self-pay | Admitting: Emergency Medicine

## 2012-07-02 ENCOUNTER — Encounter: Payer: Self-pay | Admitting: Nurse Practitioner

## 2012-07-02 ENCOUNTER — Emergency Department (HOSPITAL_COMMUNITY): Payer: Medicare Other

## 2012-07-02 DIAGNOSIS — Y9301 Activity, walking, marching and hiking: Secondary | ICD-10-CM | POA: Insufficient documentation

## 2012-07-02 DIAGNOSIS — Y9210 Unspecified residential institution as the place of occurrence of the external cause: Secondary | ICD-10-CM | POA: Insufficient documentation

## 2012-07-02 DIAGNOSIS — F329 Major depressive disorder, single episode, unspecified: Secondary | ICD-10-CM | POA: Insufficient documentation

## 2012-07-02 DIAGNOSIS — G8110 Spastic hemiplegia affecting unspecified side: Secondary | ICD-10-CM

## 2012-07-02 DIAGNOSIS — N390 Urinary tract infection, site not specified: Secondary | ICD-10-CM | POA: Insufficient documentation

## 2012-07-02 DIAGNOSIS — E785 Hyperlipidemia, unspecified: Secondary | ICD-10-CM | POA: Insufficient documentation

## 2012-07-02 DIAGNOSIS — L600 Ingrowing nail: Secondary | ICD-10-CM | POA: Insufficient documentation

## 2012-07-02 DIAGNOSIS — Z8669 Personal history of other diseases of the nervous system and sense organs: Secondary | ICD-10-CM | POA: Insufficient documentation

## 2012-07-02 DIAGNOSIS — I10 Essential (primary) hypertension: Secondary | ICD-10-CM | POA: Insufficient documentation

## 2012-07-02 DIAGNOSIS — Z7982 Long term (current) use of aspirin: Secondary | ICD-10-CM | POA: Insufficient documentation

## 2012-07-02 DIAGNOSIS — Z794 Long term (current) use of insulin: Secondary | ICD-10-CM | POA: Insufficient documentation

## 2012-07-02 DIAGNOSIS — W010XXA Fall on same level from slipping, tripping and stumbling without subsequent striking against object, initial encounter: Secondary | ICD-10-CM | POA: Insufficient documentation

## 2012-07-02 DIAGNOSIS — K5900 Constipation, unspecified: Secondary | ICD-10-CM

## 2012-07-02 DIAGNOSIS — E119 Type 2 diabetes mellitus without complications: Secondary | ICD-10-CM | POA: Insufficient documentation

## 2012-07-02 DIAGNOSIS — F3289 Other specified depressive episodes: Secondary | ICD-10-CM | POA: Insufficient documentation

## 2012-07-02 DIAGNOSIS — Z8673 Personal history of transient ischemic attack (TIA), and cerebral infarction without residual deficits: Secondary | ICD-10-CM | POA: Insufficient documentation

## 2012-07-02 DIAGNOSIS — H905 Unspecified sensorineural hearing loss: Secondary | ICD-10-CM | POA: Insufficient documentation

## 2012-07-02 DIAGNOSIS — T07XXXA Unspecified multiple injuries, initial encounter: Secondary | ICD-10-CM

## 2012-07-02 DIAGNOSIS — S0990XA Unspecified injury of head, initial encounter: Secondary | ICD-10-CM | POA: Insufficient documentation

## 2012-07-02 DIAGNOSIS — Z8679 Personal history of other diseases of the circulatory system: Secondary | ICD-10-CM | POA: Insufficient documentation

## 2012-07-02 DIAGNOSIS — Z8719 Personal history of other diseases of the digestive system: Secondary | ICD-10-CM | POA: Insufficient documentation

## 2012-07-02 DIAGNOSIS — IMO0002 Reserved for concepts with insufficient information to code with codable children: Secondary | ICD-10-CM | POA: Insufficient documentation

## 2012-07-02 DIAGNOSIS — Z8739 Personal history of other diseases of the musculoskeletal system and connective tissue: Secondary | ICD-10-CM | POA: Insufficient documentation

## 2012-07-02 DIAGNOSIS — Z87891 Personal history of nicotine dependence: Secondary | ICD-10-CM | POA: Insufficient documentation

## 2012-07-02 DIAGNOSIS — Z79899 Other long term (current) drug therapy: Secondary | ICD-10-CM | POA: Insufficient documentation

## 2012-07-02 DIAGNOSIS — D509 Iron deficiency anemia, unspecified: Secondary | ICD-10-CM

## 2012-07-02 MED ORDER — HYDROCODONE-ACETAMINOPHEN 5-325 MG PO TABS
2.0000 | ORAL_TABLET | Freq: Once | ORAL | Status: AC
  Administered 2012-07-02: 2 via ORAL
  Filled 2012-07-02: qty 2

## 2012-07-02 NOTE — Assessment & Plan Note (Signed)
Since starting iron will increase colace to BID

## 2012-07-02 NOTE — ED Notes (Addendum)
Patient returned from CT

## 2012-07-02 NOTE — ED Notes (Signed)
Pt to ED via EMS from [facility] rehab facility. Pt states she was getting up from a toilet and she fall forward, hitting right side of her head to a sink and then hit the floor. Contusion noted at right of her head. Pt denies taking anticoagulants, back or neck pain. Pt has right sided deficit from stroke. CBG-107, RR-18, pulse-82, BP-110/70.

## 2012-07-02 NOTE — Assessment & Plan Note (Signed)
Will start ferrous sulfate 325mg  BID with meals and recheck cbc in 1 month

## 2012-07-02 NOTE — Assessment & Plan Note (Signed)
Patient is stable; continue current regimen. Will monitor and make changes as necessary.  

## 2012-07-02 NOTE — Progress Notes (Signed)
Patient ID: Chelsey Gallagher, female   DOB: 12-07-1950, 62 y.o.   MRN: [id number]   PCP: Betsey Holiday, MD  Code Status: fullcode  Allergies  Allergen Reactions  . Codeine Other (See Comments)    Per Mar  . Flexeril (Cyclobenzaprine Hcl) Other (See Comments)    Per Mar  . Lyrica (Pregabalin) Other (See Comments)    "wires my up"    Chief Complaint: follow up hospitalization  HPI:   Chelsey Gallagher is a 62 year old female with a PMH of HTN, CVA, hyperlipidemia, and frequent UTI.  Doing well at [facility] with therapy recent labs show iron def anemia- pt denies shortness of breath, chest pain, increased fatigue. Currently no diarrhea or constipation.    Pt does report increased spasms in legs. She was previously on zanaflex and this was stopped in the hospital.  Would like to be on the podiatry list due to chronic ingrown toenails.   Review of Systems:  Review of Systems  Constitutional: Negative for fever, chills and malaise/fatigue.  Eyes: Negative for blurred vision and double vision.  Respiratory: Negative for cough and sputum production.   Cardiovascular: Negative for chest pain, palpitations and leg swelling.  Gastrointestinal: Negative for heartburn, abdominal pain, diarrhea and constipation (constipated at times).  Genitourinary: Negative for dysuria.  Musculoskeletal: Negative for myalgias.  Skin: Negative for itching and rash.       Ingrown toenails   Neurological: Negative for weakness and headaches. Dizziness: at times - has orthostatic hypotension.       Increased spasms in lower extermities   Psychiatric/Behavioral: Negative for depression. The patient is not nervous/anxious.      Past Medical History  Diagnosis Date  . Diabetes mellitus   . Hypertension   . Depression   . PAD (peripheral artery disease)     S/p bypass grafting, unclear exactly where  . Nephrolithiasis   . Hyperlipidemia 05/14/2011  . Hearing loss   . Complication of anesthesia     doesn't  wake up good- "usually end up in ICU"  . PONV (postoperative nausea and vomiting)   . Orthostatic hypotension     after surgery.  Marland Kitchen Heart murmur     PCP- [facility]- No tx needed.  . Stroke 05/2011    Weakness on Right. Wears leg brace.  . Foot drop, left   . H/O hiatal hernia     second one  . Neuromuscular disorder     Diabetic neuropathy- has inproved since she quit smoking.  . Arthritis    Past Surgical History  Procedure Laterality Date  . Total hip arthroplasty  2008    Right  . Abdominal hysterectomy  2003  . Cesarean section  1984  . Cholecystectomy  1984  . Bypass graft  2003    Abdominal aortic  . Kidney stone surgery  1999  . Appendectomy  2011  . Hernia repair  2011  . Lithotripsy    . Abdominal hysterectomy     Social History:   reports that she has quit smoking. Her smoking use included Cigarettes. She smoked 0.00 packs per day for 45 years. She quit smokeless tobacco use about 13 months ago. She reports that she does not drink alcohol or use illicit drugs.  Family History  Problem Relation Age of Onset  . Cancer Maternal Aunt     breast    Medications: Patient's Medications  New Prescriptions   No medications on file  Previous Medications   ASPIRIN  325 MG BUFFERED TABLET    Take 325 mg by mouth daily.   CITALOPRAM (CELEXA) 20 MG TABLET    Take 20 mg by mouth daily.   CLOPIDOGREL (PLAVIX) 75 MG TABLET    Take 75 mg by mouth daily.   DOCUSATE SODIUM (COLACE) 100 MG CAPSULE    Take 100 mg by mouth daily as needed. For constipation.   GABAPENTIN (NEURONTIN) 600 MG TABLET    Take 0.5 tablets (300 mg total) by mouth at bedtime.   INSULIN GLARGINE (LANTUS) 100 UNIT/ML INJECTION    Inject 8 Units into the skin at bedtime.    LISINOPRIL (PRINIVIL,ZESTRIL) 20 MG TABLET    Take 10 mg by mouth 2 (two) times daily.    MIDODRINE (PROAMATINE) 5 MG TABLET    Take 5 mg by mouth 3 (three) times daily.   PRAVASTATIN (PRAVACHOL) 40 MG TABLET    Take 40 mg by  mouth daily.    SODIUM CHLORIDE (OCEAN) 0.65 % NASAL SPRAY    Place 1 spray into the nose as needed. For nose bleeds.   WHITE PETROLATUM (VASELINE) GEL    Apply 1 application topically daily. Apply to toenails  Modified Medications   No medications on file  Discontinued Medications   No medications on file     Physical Exam:  Filed Vitals:   07/02/12 1329  BP: 131/70  Pulse: 80  Temp: 98.7 F (37.1 C)  Resp: 20   Physical Exam  Nursing note and vitals reviewed. Constitutional: She is oriented to person, place, and time. She appears well-developed and well-nourished. No distress.  HENT:  Head: Normocephalic and atraumatic.  Neck: Normal range of motion. Neck supple.  Cardiovascular: Normal rate, regular rhythm and normal heart sounds.   Pulmonary/Chest: Effort normal and breath sounds normal.  Abdominal: Soft. Bowel sounds are normal.  Musculoskeletal: She exhibits no edema and no tenderness.  Right sided weakness. Self propels in wc  Neurological: She is alert and oriented to person, place, and time.  Skin: Skin is warm and dry. She is not diaphoretic.  Psychiatric: She has a normal mood and affect.      Labs reviewed: Basic Metabolic Panel:  Recent Labs  95/62/13 0345 04/06/12 0430 04/07/12 0435  NA 133* 135 135  K 4.3 4.3 3.7  CL 101 104 100  CO2 23 25 26   GLUCOSE 87 92 120*  BUN 48* 28* 18  CREATININE 0.93 0.84 0.62  CALCIUM 8.9 9.0 9.8   Liver Function Tests:  Recent Labs  04/05/12 0345 04/06/12 0430 04/07/12 0435  AST 21 24 22   ALT 16 18 19   ALKPHOS 60 62 79  BILITOT 0.1* 0.2* 0.3  PROT 5.9* 6.3 7.4  ALBUMIN 3.0* 3.1* 3.7   No results found for this basename: LIPASE, AMYLASE,  in the last 8760 hours No results found for this basename: AMMONIA,  in the last 8760 hours CBC:  Recent Labs  08/29/11 1513 04/04/12 2144  04/05/12 1016 04/06/12 0430 04/07/12 0435  WBC 8.2 6.3  < > 6.6 5.7 7.1  NEUTROABS 5.0 4.0  --   --   --   --   HGB  13.7 8.4*  < > 9.1* 9.6* 11.3*  HCT 39.0 25.4*  < > 28.0* 29.1* 33.9*  MCV 90.1 87.6  < > 88.3 89.0 87.1  PLT 167 124*  < > 136* 139* 178  < > = values in this interval not displayed. CBG:  Recent Labs  04/06/12 2200 04/07/12  0734 04/07/12 1152  GLUCAP 103* 115* 138*      07/01/2012         Iron  33     L  42-145  ug/dL  SLN       UIBC  469     H  125-400  ug/dL  SLN       TIBC  629        250-470  ug/dL  SLN       %SAT  8     L  20-55  %  SLN       CBC NO Diff (Complete Blood Count)       Result: 07/01/2012 2:04 PM    ( Status: F )            WBC  7.2        4.0-10.5  K/uL  SLN       RBC  3.52     L  3.87-5.11  MIL/uL  SLN       Hemoglobin  9.8     L  12.0-15.0  g/dL  SLN       Hematocrit  29.6     L  36.0-46.0  %  SLN       MCV  84.1        78.0-100.0  fL  SLN       MCH  27.8        26.0-34.0  pg  SLN       MCHC  33.1        30.0-36.0  g/dL  SLN       RDW  52.8        11.5-15.5  %  SLN       Platelet Count  313        150-400  K/uL  SLN      Reticulocyte Count (RETIC)       Result: 07/01/2012 2:04 PM    ( Status: F )            % Retic  3.6     H  0.4-2.3  %  SLN       RBC  3.52     L  3.87-5.11  MIL/uL  SLN       ABS Retic  126.7        19.0-186.0  K/uL  SLN      Vitamin B12       Result: 07/01/2012 3:41 PM    ( Status: F )            Vitamin B12  435        211-911  pg/mL  SLN      Ferritin       Result: 07/01/2012 3:41 PM    ( Status: F )            Ferritin  24        10-291      Assessment/Plan Anemia, iron deficiency Will start ferrous sulfate 325mg  BID with meals and recheck cbc in 1 month   Ingrown toenail Chronic- Will have staff add pt to podiatry list   Spastic hemiplegia affecting dominant side Will add zanaflex 4mg  qhs for spasms  Unspecified constipation Since starting iron will increase colace to BID

## 2012-07-02 NOTE — ED Notes (Signed)
Pt waiting for transport by [facility]. 

## 2012-07-02 NOTE — Assessment & Plan Note (Signed)
Stable- cont asa and plavix

## 2012-07-02 NOTE — Assessment & Plan Note (Signed)
Cont statin

## 2012-07-02 NOTE — Assessment & Plan Note (Signed)
Chronic- Will have staff add pt to podiatry list

## 2012-07-02 NOTE — Assessment & Plan Note (Signed)
Will add zanaflex 4mg  qhs for spasms

## 2012-07-02 NOTE — Assessment & Plan Note (Signed)
Stable on lantus 8 units qhs

## 2012-07-02 NOTE — ED Provider Notes (Signed)
History     CSN: [id number]  Arrival date & time 07/02/12  2120   First MD Initiated Contact with Patient 07/02/12 2127      Chief Complaint  Patient presents with  . Fall    (Consider location/radiation/quality/duration/timing/severity/associated sxs/prior treatment) HPI Comments: Patient presents after sustaining a fall. She's currently in a nursing home facility and lives going to the bathroom before she went to bed. She went to the bathroom and was walking out to get into her chair and she tripped and fell hitting her head on the seat. There is no loss of consciousness. She has no neck or back pain. She has throbbing pain to the right side of her head where he had the same. She has chronic right-sided weakness due to a previous stroke but denies any change in her numbness or weakness. Denies any other injuries from the fall. She denies any preceding events such as dizziness, chest pain or shortness of breath.  Patient is a 62 y.o. female presenting with fall.  Fall Associated symptoms include headaches. Pertinent negatives include no fever, no numbness, no abdominal pain, no nausea, no vomiting and no hematuria.    Past Medical History  Diagnosis Date  . Diabetes mellitus   . Hypertension   . Depression   . PAD (peripheral artery disease)     S/p bypass grafting, unclear exactly where  . Nephrolithiasis   . Hyperlipidemia 05/14/2011  . Hearing loss   . Complication of anesthesia     doesn't wake up good- "usually end up in ICU"  . PONV (postoperative nausea and vomiting)   . Orthostatic hypotension     after surgery.  Marland Kitchen Heart murmur     PCP- [facility]- No tx needed.  . Stroke 05/2011    Weakness on Right. Wears leg brace.  . Foot drop, left   . H/O hiatal hernia     second one  . Neuromuscular disorder     Diabetic neuropathy- has inproved since she quit smoking.  . Arthritis     Past Surgical History  Procedure Laterality Date  . Total hip arthroplasty   2008    Right  . Abdominal hysterectomy  2003  . Cesarean section  1984  . Cholecystectomy  1984  . Bypass graft  2003    Abdominal aortic  . Kidney stone surgery  1999  . Appendectomy  2011  . Hernia repair  2011  . Lithotripsy    . Abdominal hysterectomy      Family History  Problem Relation Age of Onset  . Cancer Maternal Aunt     breast    History  Substance Use Topics  . Smoking status: Former Smoker -- 0.00 packs/day for 45 years    Types: Cigarettes  . Smokeless tobacco: Former Neurosurgeon    Quit date: 05/08/2011  . Alcohol Use: No    OB History   Grav Para Term Preterm Abortions TAB SAB Ect Mult Living                  Review of Systems  Constitutional: Negative for fever, chills, diaphoresis and fatigue.  HENT: Negative for congestion, rhinorrhea, sneezing and neck pain.   Eyes: Negative.   Respiratory: Negative for cough, chest tightness and shortness of breath.   Cardiovascular: Negative for chest pain and leg swelling.  Gastrointestinal: Negative for nausea, vomiting, abdominal pain, diarrhea and blood in stool.  Genitourinary: Negative for frequency, hematuria, flank pain and difficulty urinating.  Musculoskeletal: Negative for back pain and arthralgias.  Skin: Positive for wound (Abrasion). Negative for rash.  Neurological: Positive for headaches. Negative for dizziness, speech difficulty, weakness and numbness.    Allergies  Codeine; Flexeril; and Lyrica  Home Medications   Current Outpatient Rx  Name  Route  Sig  Dispense  Refill  . aspirin EC 81 MG tablet   Oral   Take 81 mg by mouth daily.         . citalopram (CELEXA) 20 MG tablet   Oral   Take 20 mg by mouth daily.         . clopidogrel (PLAVIX) 75 MG tablet   Oral   Take 75 mg by mouth daily.         Marland Kitchen docusate sodium (COLACE) 100 MG capsule   Oral   Take 100 mg by mouth 2 (two) times daily. For constipation.         . ferrous sulfate 325 (65 FE) MG tablet   Oral   Take  325 mg by mouth 2 (two) times daily with a meal.         . gabapentin (NEURONTIN) 300 MG capsule   Oral   Take 300 mg by mouth at bedtime.         . insulin glargine (LANTUS) 100 UNIT/ML injection   Subcutaneous   Inject 8 Units into the skin at bedtime.          Marland Kitchen lisinopril (PRINIVIL,ZESTRIL) 20 MG tablet   Oral   Take 10 mg by mouth 2 (two) times daily.          . midodrine (PROAMATINE) 5 MG tablet   Oral   Take 5 mg by mouth 3 (three) times daily.         . pravastatin (PRAVACHOL) 40 MG tablet   Oral   Take 40 mg by mouth daily.            BP 174/67  Pulse 80  Temp(Src) 98.2 F (36.8 C) (Oral)  Resp 20  SpO2 92%  Physical Exam  Constitutional: She is oriented to person, place, and time. She appears well-developed and well-nourished.  HENT:  Head: Normocephalic and atraumatic.  Right Ear: External ear normal.  Left Ear: External ear normal.  Mouth/Throat: Oropharynx is clear and moist.  Some mild swelling and tenderness to the right temporal area. There's a small abrasion to the right ear. There is no active bleeding.  Eyes: Pupils are equal, round, and reactive to light.  Neck: Normal range of motion. Neck supple.  No pain to the neck or back  Cardiovascular: Normal rate, regular rhythm and normal heart sounds.   Pulmonary/Chest: Effort normal and breath sounds normal. No respiratory distress. She has no wheezes. She has no rales. She exhibits no tenderness.  Abdominal: Soft. Bowel sounds are normal. There is no tenderness. There is no rebound and no guarding.  Musculoskeletal: Normal range of motion. She exhibits no edema.  Small abrasion to the right elbow. There's no pain on palpation or range of motion of extremities  Lymphadenopathy:    She has no cervical adenopathy.  Neurological: She is alert and oriented to person, place, and time.  Patient has chronic weakness in her left arm and left leg as compared to the right which she states is  unchanged her baseline  Skin: Skin is warm and dry. No rash noted.  Psychiatric: She has a normal mood and affect.    ED Course  Procedures (including critical care time)   Ct Head Wo Contrast  07/02/2012  *RADIOLOGY REPORT*  Clinical Data: Fall, right-sided head trauma  CT HEAD WITHOUT CONTRAST  Technique:  Contiguous axial images were obtained from the base of the skull through the vertex without contrast.  Comparison: 08/31/2011 MRI, 05/09/2011 CT  Findings: Periventricular and subcortical white matter hypodensities are most in keeping with chronic microangiopathic change. There is no evidence for acute hemorrhage, hydrocephalus, mass lesion, or abnormal extra-axial fluid collection.  No definite CT evidence for acute infarction.  Scattered atherosclerotic disease. The visualized paranasal sinuses and mastoid air cells are predominately clear, with a right maxillary sinus mucous retention cyst versus polyp.  IMPRESSION: White matter changes as above.  No CT evidence for acute intracranial abnormality.   Original Report Authenticated By: Jearld Lesch, M.D.       1. Head injury, initial encounter   2. Abrasions of multiple sites       MDM  Patient is well-appearing with minor head injury. She has no neurologic deficits. No other signs of injury. She was discharged back to her nursing facility.        Rolan Bucco, MD 07/02/12 2258

## 2012-07-03 ENCOUNTER — Emergency Department (HOSPITAL_COMMUNITY): Payer: Medicare Other

## 2012-07-03 NOTE — ED Notes (Signed)
Pt dc to [facility] by ptar. Pt sts understanding to dc instructions.  Report given to ptar

## 2012-07-08 ENCOUNTER — Ambulatory Visit (INDEPENDENT_AMBULATORY_CARE_PROVIDER_SITE_OTHER): Payer: Medicare Other | Admitting: Neurology

## 2012-07-08 ENCOUNTER — Encounter: Payer: Self-pay | Admitting: Neurology

## 2012-07-08 VITALS — BP 134/77 | HR 78 | Temp 98.5°F | Wt 138.0 lb

## 2012-07-08 DIAGNOSIS — I6350 Cerebral infarction due to unspecified occlusion or stenosis of unspecified cerebral artery: Secondary | ICD-10-CM

## 2012-07-08 DIAGNOSIS — I639 Cerebral infarction, unspecified: Secondary | ICD-10-CM

## 2012-07-08 MED ORDER — TIZANIDINE HCL 4 MG PO CAPS: 4.0000 mg | ORAL_CAPSULE | Freq: Three times a day (TID) | ORAL | Status: DC

## 2012-07-08 NOTE — Progress Notes (Signed)
History of Present Illness: Chelsey Gallagher is an 62 y.o. female seen for followup after hospital admission on 05/18/2011 with sudden right hemiparesis and numbness. 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 Carotid ultrasound showed high grade stenosis versus occlusion of the left internal carotid artery which was confirmed on cerebral catheter angiogram. 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 Multiple vascular risk factors down including diabetes, hyperlipidemia, hypertension, smoking, PVD, and carotid and intracranial atherosclerotic disease. Echocardiogram showed normal ejection fraction. Hemoglobin A1c was 10.2. Rt CCA/ICA prox stent assisted angioplasty with distal protection for severe stenosis was completed on 09/01/2011 by [name].  Since last followup visit on 02/19/2012 patient was doing much better and felt like she could go home from [facility]. After being home 1 week she had fallen 3 times, the last fall resulting in a 5 day hospital admission. On discharge from hospital she went back to [facility] and realizes that she cannot live independently anymore. Was using a 4- prong cane before the last fall, but is now having to use a hemiwalker with assistance. She is having significant bruising from being on aspirin and Plavix and from a recent fall. Her lipids remain strictly at uncontrolled and last known A1c was 5.3. This represents significant improvement from 10.2 at last check. She she plans on having her follow up diagnostic cerebral catheter angiogram by Dr. Corliss Skains next week to look for stent restenosis.  mRankin: 3   Neurologic Exam: Mental Status: Alert, oriented, thought content appropriate.  Speech fluent without evidence of aphasia. Mild dysarthria.   Able to follow 3 step  commands without difficulty. Cranial Nerves: II- Visual fields grossly intact, with both eyes open  III/IV/VI-Extraocular movements intact.  Pupils reactive bilaterally. V/VII-Smile symmetric VIII-hearing grossly intact IX/X-normal gag XI-bilateral shoulder shrug, decreased on R XII-midline tongue extension Motor: Spastic right hemiparesis with grade 3/5 weakness RUE at shoulder and elbow with significant right hand and grip weakness she is able to bend her fingers partially.  3-4/5 RLE with increased tone, Right foot drop. Normal strength and tone on the left side. Sensory: diminished sensation on the right. Deep Tendon Reflexes: 2+ and symmetric throughout, 0 achilles Cerebellar: Normal finger-to-nose L, normal rapid alternating movements L.  Unable to perform on the right. Gait and station not tested as sitting in a wheelchair.  Assessment: 62 y.o. 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 Continues to have R UE paresis and weakness RLE. Severe spasticity of right arm/hand, barely able to extend fingers manually. Patient has appt with Dr. Corliss Skains on 5/14 for eval of right carotid stenosis. Mild - mod AS on 2D Echo Diabetes Mellitus- controlled; A1C 5.3 HTN- controlled. Hyperlipidemia; not at goal <80; LDL 135     Plan: Continue aspirin 325 mg daily and Plavix 75 mg daily for secondary stroke prevention. Consider stopping aspirin after discussion with Dr. Corliss Skains since it has been more than 3 months since her stent Increase Zanaflex 4 mg to BID x 2 weeks and then increase to TID for spasticity relief. Schedule Botox injections with Dr. Terrace Arabia for right arm/hand spasticity after insurance approval.. Consult signed by patient today. LDL not at goal, consider changing to Crestor. Continue strict  diabetes control. Return for  follow up in 3 months.

## 2012-07-08 NOTE — Patient Instructions (Addendum)
Return for Botox injections by Dr. Terrace Arabia to Right Hand after insurance approval. Increase Zanaflex to twice a day x 2 weeks, then increase to three times a day. Return for follow up in 3 months.

## 2012-07-14 ENCOUNTER — Ambulatory Visit (HOSPITAL_COMMUNITY)
Admission: RE | Admit: 2012-07-14 | Discharge: 2012-07-14 | Disposition: A | Discharge status: Home / Self Care | Payer: Medicare Other | Source: Ambulatory Visit | Attending: Interventional Radiology | Admitting: Interventional Radiology

## 2012-07-14 DIAGNOSIS — I6529 Occlusion and stenosis of unspecified carotid artery: Secondary | ICD-10-CM

## 2012-07-14 DIAGNOSIS — Z8673 Personal history of transient ischemic attack (TIA), and cerebral infarction without residual deficits: Secondary | ICD-10-CM | POA: Insufficient documentation

## 2012-07-14 DIAGNOSIS — I639 Cerebral infarction, unspecified: Secondary | ICD-10-CM

## 2012-07-14 NOTE — Progress Notes (Addendum)
Bilateral carotid artery duplex completed.  Right:  No evidence of significant ICA stenosis.  The stent is patent.  Left:  Occluded internal carotid artery.  Retrograde flow in the external branch that feeds the ICA past the occlusion.  Bilateral:  Vertebral artery flow is antegrade.

## 2012-07-21 ENCOUNTER — Telehealth: Payer: Self-pay

## 2012-07-21 NOTE — Telephone Encounter (Signed)
Patient called to see if her botox was approved for her appointment.

## 2012-08-09 ENCOUNTER — Non-Acute Institutional Stay (SKILLED_NURSING_FACILITY): Payer: Medicare Other | Admitting: Nurse Practitioner

## 2012-08-09 ENCOUNTER — Ambulatory Visit: Payer: Medicare Other | Admitting: Physical Medicine & Rehabilitation

## 2012-08-09 DIAGNOSIS — D509 Iron deficiency anemia, unspecified: Secondary | ICD-10-CM

## 2012-08-09 DIAGNOSIS — G8110 Spastic hemiplegia affecting unspecified side: Secondary | ICD-10-CM

## 2012-08-09 DIAGNOSIS — E119 Type 2 diabetes mellitus without complications: Secondary | ICD-10-CM

## 2012-08-09 DIAGNOSIS — N390 Urinary tract infection, site not specified: Secondary | ICD-10-CM

## 2012-08-09 NOTE — Assessment & Plan Note (Signed)
Patient is stable; continue current regimen. Will monitor and make changes as necessary.  

## 2012-08-09 NOTE — Assessment & Plan Note (Signed)
Was on Zanaflex however once dose was increased pt could not tolerate medication changed to baclofen at night- which helps but still stays awake at night with spasm and restless legs will increase baclofen to 20 mg qhs

## 2012-08-09 NOTE — Assessment & Plan Note (Signed)
Pt with complaints of dysuria - will check UA C&S- encouraged increase in hydration

## 2012-08-09 NOTE — Progress Notes (Signed)
Patient ID: Chelsey Gallagher, female   DOB: 02/24/1951, 62 y.o.   MRN: [id number]  Nursing Home Location:  [facility]   Place of Service: SNF (31)    Chief Complaint: Medical Managment of Chronic Issues   HPI:  Ms Chelsey Gallagher is a 62 year old female with a PMH of HTN, CVA, hyperlipidemia, and frequent UTI. Doing well at [facility]- pt denies shortness of breath, chest pain, increased fatigue. Currently no diarrhea or constipation. Pt with complaints of dysuria and frequency. No fevers or chills  Pt does report increased spasms in legs. baclofen has been mildly effective   Review of Systems:  Review of Systems  Constitutional: Negative for fever, chills, weight loss and malaise/fatigue.  Respiratory: Negative for cough and shortness of breath.   Cardiovascular: Negative for chest pain, palpitations and leg swelling.  Gastrointestinal: Negative for heartburn, abdominal pain, diarrhea and constipation.  Genitourinary: Positive for dysuria, urgency and frequency.  Musculoskeletal: Positive for myalgias and joint pain.  Skin: Negative for itching and rash.  Neurological: Negative for dizziness, weakness and headaches.  Psychiatric/Behavioral: Negative for depression. The patient has insomnia (due to increase in pain and spams in legs at night ). The patient is not nervous/anxious.      Medications: Patient's Medications  New Prescriptions   No medications on file  Previous Medications   ACETAMINOPHEN (TYLENOL) 325 MG TABLET    Take 650 mg by mouth every 6 (six) hours as needed for pain.   ASPIRIN EC 81 MG TABLET    Take 81 mg by mouth daily.   BACLOFEN (LIORESAL) 10 MG TABLET    Take 10 mg by mouth at bedtime.   BISACODYL (DULCOLAX) 10 MG SUPPOSITORY    Place 10 mg rectally as needed for constipation.   CITALOPRAM (CELEXA) 20 MG TABLET    Take 20 mg by mouth daily.   CLOPIDOGREL (PLAVIX) 75 MG TABLET    Take 75 mg by mouth daily.   DOCUSATE SODIUM (COLACE) 100 MG CAPSULE     Take 100 mg by mouth 2 (two) times daily. For constipation.   FERROUS SULFATE 325 (65 FE) MG TABLET    Take 325 mg by mouth 2 (two) times daily with a meal.   GABAPENTIN (NEURONTIN) 300 MG CAPSULE    Take 300 mg by mouth at bedtime.   INSULIN GLARGINE (LANTUS) 100 UNIT/ML INJECTION    Inject 8 Units into the skin at bedtime.    LISINOPRIL (PRINIVIL,ZESTRIL) 20 MG TABLET    Take 10 mg by mouth 2 (two) times daily.    MAGNESIUM HYDROXIDE (MILK OF MAGNESIA) 800 MG/5ML SUSPENSION    Take by mouth daily as needed for constipation. 30cc   MIDODRINE (PROAMATINE) 5 MG TABLET    Take 5 mg by mouth 3 (three) times daily.   PRAVASTATIN (PRAVACHOL) 40 MG TABLET    Take 40 mg by mouth daily.    SODIUM PHOSPHATES (SALINE LAXATIVE) 0.9-2.4 GM/5ML SOLN    Take by mouth.   TRAMADOL (ULTRAM) 50 MG TABLET    Take 50 mg by mouth every 6 (six) hours as needed for pain.  Modified Medications   No medications on file  Discontinued Medications   TIZANIDINE (ZANAFLEX) 4 MG CAPSULE    Take 1 capsule (4 mg total) by mouth 3 (three) times daily.   TIZANIDINE (ZANAFLEX) 4 MG TABLET    Take 1 tablet (4 mg total) by mouth at bedtime.     Physical Exam:  Ceasar Mons  Vitals:   08/09/12 1106  BP: 138/77  Pulse: 77  Temp: 97 F (36.1 C)  Resp: 20  Weight: 139 lb (63.05 kg)    Physical Exam  Constitutional: She is oriented to person, place, and time. She appears well-developed and well-nourished. No distress.  HENT:  Head: Normocephalic and atraumatic.  Mouth/Throat: Oropharynx is clear and moist.  Neck: Normal range of motion. Neck supple.  Cardiovascular: Normal rate and regular rhythm.   Pulmonary/Chest: Effort normal and breath sounds normal. No respiratory distress.  Abdominal: Soft. Bowel sounds are normal. She exhibits no distension. There is no tenderness.  Musculoskeletal: She exhibits no edema and no tenderness.  Right sided hemiparesis   Neurological: She is alert and oriented to person, place, and time.   Skin: Skin is warm and dry. She is not diaphoretic.  Psychiatric: She has a normal mood and affect.      Labs reviewed:  Iron and IBC       Result: 07/01/2012 2:29 PM    ( Status: F )            Iron  33     L  42-145  ug/dL  SLN       UIBC  161     H  125-400  ug/dL  SLN       TIBC  096        250-470  ug/dL  SLN       %SAT  8     L  20-55  %  SLN      CBC NO Diff (Complete Blood Count)       Result: 07/01/2012 2:04 PM    ( Status: F )            WBC  7.2        4.0-10.5  K/uL  SLN       RBC  3.52     L  3.87-5.11  MIL/uL  SLN       Hemoglobin  9.8     L  12.0-15.0  g/dL  SLN       Hematocrit  29.6     L  36.0-46.0  %  SLN       MCV  84.1        78.0-100.0  fL  SLN       MCH  27.8        26.0-34.0  pg  SLN       MCHC  33.1        30.0-36.0  g/dL  SLN       RDW  04.5        11.5-15.5  %  SLN       Platelet Count  313        150-400  K/uL  SLN      Reticulocyte Count (RETIC)       Result: 07/01/2012 2:04 PM    ( Status: F )            % Retic  3.6     H  0.4-2.3  %  SLN       RBC  3.52     L  3.87-5.11  MIL/uL  SLN       ABS Retic  126.7        19.0-186.0  K/uL  SLN      Vitamin B12       Result: 07/01/2012 3:41 PM    ( Status:  F )            Vitamin B12  435        211-911  pg/mL  SLN      Ferritin       Result: 07/01/2012 3:41 PM    ( Status: F )            Ferritin  24        10-291  ng/mL     Basic Metabolic Panel       Result: 07/10/2012 9:40 PM    ( Status: F )       C     Sodium  129     L  135-145  mEq/L  SLN       Potassium  5.4     H  3.5-5.3  mEq/L  SLN  C     Chloride  99        96-112  mEq/L  SLN       CO2  23        19-32  mEq/L  SLN       Glucose  137     H  70-99  mg/dL  SLN       BUN  24     H  6-23  mg/dL  SLN       Creatinine  0.85        0.50-1.10  mg/dL  SLN       Calcium  9.9        8.4-10.5  mg/dL  SLN      Lipid Profile       Result: 07/10/2012 9:40 PM    ( Status: F )            Cholesterol  157        0-200  mg/dL  SLN  C     Triglyceride  176     H   <150  mg/dL  SLN       HDL Cholesterol  32     L  >39  mg/dL  SLN       Total Chol/HDL Ratio  4.9         Ratio  SLN       VLDL Cholesterol (Calc)  35        0-40  mg/dL  SLN       LDL Cholesterol (Calc)  90        0-99  mg/dL  SLN  C    Liver Profile       Result: 07/10/2012 9:40 PM    ( Status: F )            Bilirubin, Total  0.4        0.3-1.2  mg/dL  SLN       Bilirubin, Direct  0.1        0.0-0.3  mg/dL  SLN       Indirect Bilirubin  0.3        0.0-0.9  mg/dL  SLN       Alkaline Phosphatase  109        39-117  U/L  SLN       AST/SGOT  35        0-37  U/L  SLN       ALT/SGPT  39     H  0-35  U/L  SLN       Total Protein  6.7  6.0-8.3  g/dL  SLN       Albumin  3.7        3.5-5.2  g/dL  SLN      Hemoglobin E4V       Result: 07/10/2012 11:56 PM    ( Status: F )            Hemoglobin A1C  5.9     H  <5.7  %  SLN  C     Estimated Average Glucose  123     H  <117  mg/dL  SLN        Assessment/Plan Anemia, iron deficiency Will follow up CBC  Diabetes mellitus Patient is stable; continue current regimen. Will monitor and make changes as necessary.   Spastic hemiplegia affecting dominant side Was on Zanaflex however once dose was increased pt could not tolerate medication changed to baclofen at night- which helps but still stays awake at night with spasm and restless legs will increase baclofen to 20 mg qhs   UTI (urinary tract infection) Pt with complaints of dysuria - will check UA C&S- encouraged increase in hydration     Labs/tests ordered Cbc and bmp

## 2012-08-09 NOTE — Assessment & Plan Note (Signed)
Will follow up CBC  

## 2012-08-19 ENCOUNTER — Ambulatory Visit (HOSPITAL_BASED_OUTPATIENT_CLINIC_OR_DEPARTMENT_OTHER): Payer: Medicare Other | Admitting: Physical Medicine & Rehabilitation

## 2012-08-19 ENCOUNTER — Encounter: Payer: Self-pay | Admitting: Physical Medicine & Rehabilitation

## 2012-08-19 ENCOUNTER — Encounter: Payer: Medicare Other | Attending: Physical Medicine & Rehabilitation

## 2012-08-19 VITALS — BP 110/65 | HR 58 | Resp 16 | Ht 63.0 in | Wt 137.0 lb

## 2012-08-19 DIAGNOSIS — G8110 Spastic hemiplegia affecting unspecified side: Secondary | ICD-10-CM | POA: Insufficient documentation

## 2012-08-19 NOTE — Patient Instructions (Signed)

## 2012-08-19 NOTE — Progress Notes (Signed)
Botox Injection for spasticity using needle EMG guidance  Dilution: 50 Units/ml Indication: Severe spasticity which interferes with ADL,mobility and/or  hygiene and is unresponsive to medication management and other conservative care Informed consent was obtained after describing risks and benefits of the procedure with the patient. This includes bleeding, bruising, infection, excessive weakness, or medication side effects. A REMS form is on file and signed. Needle: 27 gauge 1 inch needle electrode Number of units per muscle Biceps500 FCR50 FCU0 Brachioradialis 50 FDP50 FDS 50 PL 50 All injections were done after obtaining appropriate EMG activity and after negative drawback for blood. The patient tolerated the procedure well. Post procedure instructions were given. A followup appointment was made.

## 2012-08-30 ENCOUNTER — Encounter: Payer: Self-pay | Admitting: Nurse Practitioner

## 2012-08-30 ENCOUNTER — Non-Acute Institutional Stay (SKILLED_NURSING_FACILITY): Payer: Medicare Other | Admitting: Nurse Practitioner

## 2012-08-30 DIAGNOSIS — G8110 Spastic hemiplegia affecting unspecified side: Secondary | ICD-10-CM

## 2012-08-30 DIAGNOSIS — K5900 Constipation, unspecified: Secondary | ICD-10-CM

## 2012-08-30 NOTE — Progress Notes (Signed)
Patient ID: Chelsey Gallagher, female   DOB: Sep 24, 1950, 62 y.o.   MRN: [id number]  Nursing Home Location:  [facility]   Place of Service: SNF [phone])   Chief Complaint: Acute visit  HPI:  62 year old female with PMH of CVA and spastic hemiplegia complains of increase pain in right arm and severe constipation.  Pt reports pain and spasm is still bad throughout the day and at night- pt has had increase in baclofen to 20 mg q hs  Pt reports that constipation has been increasingly worse. She has required edemas and multiple PRNs to help with the stool and then when she does have a BM it is very hard and painful. Review of Systems:  Review of Systems  Constitutional: Negative for fever, chills and malaise/fatigue.  Gastrointestinal: Positive for constipation. Negative for abdominal pain and diarrhea.  Musculoskeletal: Positive for myalgias (in right arm).  Neurological: Positive for tingling (in right arm). Negative for weakness.     Medications: Patient's Medications  New Prescriptions   No medications on file  Previous Medications   ACETAMINOPHEN (TYLENOL) 325 MG TABLET    Take 650 mg by mouth every 6 (six) hours as needed for pain.   ASPIRIN EC 81 MG TABLET    Take 81 mg by mouth daily.   BACLOFEN (LIORESAL) 10 MG TABLET    Take 20 mg by mouth at bedtime.    BISACODYL (DULCOLAX) 10 MG SUPPOSITORY    Place 10 mg rectally as needed for constipation.   CITALOPRAM (CELEXA) 20 MG TABLET    Take 20 mg by mouth daily.   CLOPIDOGREL (PLAVIX) 75 MG TABLET    Take 75 mg by mouth daily.   DOCUSATE SODIUM (COLACE) 100 MG CAPSULE    Take 100 mg by mouth 2 (two) times daily. For constipation.   FERROUS SULFATE 325 (65 FE) MG TABLET    Take 325 mg by mouth 2 (two) times daily with a meal.   GABAPENTIN (NEURONTIN) 300 MG CAPSULE    Take 300 mg by mouth at bedtime.   INSULIN GLARGINE (LANTUS) 100 UNIT/ML INJECTION    Inject 8 Units into the skin at bedtime.    LISINOPRIL  (PRINIVIL,ZESTRIL) 20 MG TABLET    Take 10 mg by mouth 2 (two) times daily.    MAGNESIUM HYDROXIDE (MILK OF MAGNESIA) 800 MG/5ML SUSPENSION    Take by mouth daily as needed for constipation. 30cc   MIDODRINE (PROAMATINE) 5 MG TABLET    Take 5 mg by mouth 3 (three) times daily.   PRAVASTATIN (PRAVACHOL) 40 MG TABLET    Take 40 mg by mouth daily.    SODIUM PHOSPHATES (SALINE LAXATIVE) 0.9-2.4 GM/5ML SOLN    Take by mouth.   TRAMADOL (ULTRAM) 50 MG TABLET    Take 50 mg by mouth every 6 (six) hours as needed for pain.  Modified Medications   No medications on file  Discontinued Medications   No medications on file     Physical Exam:  Filed Vitals:   08/30/12 1447  BP: 111/56  Pulse: 70  Temp: 98.3 F (36.8 C)  Resp: 18    Physical Exam  Constitutional: She is well-developed, well-nourished, and in no distress. No distress.  Cardiovascular: Normal rate and regular rhythm.   Pulmonary/Chest: Effort normal and breath sounds normal.  Abdominal: Soft. Bowel sounds are normal. She exhibits no distension. There is no tenderness.  Neurological: She is alert.  Right sided hemiplegia   Skin:  Skin is warm and dry. She is not diaphoretic.         Assessment/Plan  1.   Spastic hemiplegia affecting dominant side 342.11     Will add baclofen doses of 5 mg in am and again at 4 pm- will keep bedtime 20 mg dose    2.   Unspecified constipation   Educated to increase water intake; will add miralax 17 gms daily

## 2012-09-02 ENCOUNTER — Encounter: Payer: Self-pay | Admitting: Nurse Practitioner

## 2012-09-02 ENCOUNTER — Non-Acute Institutional Stay (SKILLED_NURSING_FACILITY): Payer: Medicare Other | Admitting: Nurse Practitioner

## 2012-09-02 DIAGNOSIS — K5900 Constipation, unspecified: Secondary | ICD-10-CM

## 2012-09-02 DIAGNOSIS — H6120 Impacted cerumen, unspecified ear: Secondary | ICD-10-CM

## 2012-09-02 DIAGNOSIS — H6123 Impacted cerumen, bilateral: Secondary | ICD-10-CM

## 2012-09-02 NOTE — Progress Notes (Signed)
Patient ID: Chelsey Gallagher, female   DOB: [birth year], 62 y.o.   MRN: [id number]  Nursing Home Location:  [facility]   Place of Service: SNF (31)   Chief Complaint: AV  HPI:  62 year old female with PMH of CVA and spastic hemiplegia complains of increase pain in right arm and constipation was recently seen for constipation- she reports this is unchanged and she is still having a hard time going to the bathroom  Pt also reports her ears have been stopped up for sometime and she request treatment. Reports she can not hear out of either ear and has been trying to get the wax out herself which is causing discomfort.    Review of Systems:  Review of Systems  Constitutional: Negative for fever, chills and malaise/fatigue.  HENT: Positive for hearing loss and ear pain. Negative for ear discharge.        Pain due to trying to disimpact wax  Respiratory: Negative for shortness of breath.   Cardiovascular: Negative for chest pain.  Gastrointestinal: Positive for constipation. Negative for abdominal pain and diarrhea.  Genitourinary: Negative for dysuria, urgency and frequency.  Neurological: Negative for weakness.     Medications: Patient's Medications  New Prescriptions   No medications on file  Previous Medications   ACETAMINOPHEN (TYLENOL) 325 MG TABLET    Take 650 mg by mouth every 6 (six) hours as needed for pain.   ASPIRIN EC 81 MG TABLET    Take 81 mg by mouth daily.   BACLOFEN (LIORESAL) 10 MG TABLET    Take 20 mg by mouth at bedtime.    BISACODYL (DULCOLAX) 10 MG SUPPOSITORY    Place 10 mg rectally as needed for constipation.   CITALOPRAM (CELEXA) 20 MG TABLET    Take 20 mg by mouth daily.   CLOPIDOGREL (PLAVIX) 75 MG TABLET    Take 75 mg by mouth daily.   DOCUSATE SODIUM (COLACE) 100 MG CAPSULE    Take 100 mg by mouth 2 (two) times daily. For constipation.   FERROUS SULFATE 325 (65 FE) MG TABLET    Take 325 mg by mouth 2 (two) times daily with a meal.   GABAPENTIN (NEURONTIN) 300 MG CAPSULE    Take 300 mg by mouth at bedtime.   INSULIN GLARGINE (LANTUS) 100 UNIT/ML INJECTION    Inject 8 Units into the skin at bedtime.    LISINOPRIL (PRINIVIL,ZESTRIL) 20 MG TABLET    Take 10 mg by mouth 2 (two) times daily.    MAGNESIUM HYDROXIDE (MILK OF MAGNESIA) 800 MG/5ML SUSPENSION    Take by mouth daily as needed for constipation. 30cc   MIDODRINE (PROAMATINE) 5 MG TABLET    Take 5 mg by mouth 3 (three) times daily.   PRAVASTATIN (PRAVACHOL) 40 MG TABLET    Take 40 mg by mouth daily.    SODIUM PHOSPHATES (SALINE LAXATIVE) 0.9-2.4 GM/5ML SOLN    Take by mouth.   TRAMADOL (ULTRAM) 50 MG TABLET    Take 50 mg by mouth every 6 (six) hours as needed for pain.  Modified Medications   No medications on file  Discontinued Medications   No medications on file     Physical Exam:  Filed Vitals:   09/01/12 1633  BP: 156/75  Pulse: 65  Temp: 97.4 F (36.3 C)  Resp: 20   Physical Exam  Constitutional: She is well-developed, well-nourished, and in no distress. No distress.  HENT:  Right Ear: External ear and ear  canal normal. No drainage, swelling or tenderness.  Left Ear: External ear and ear canal normal. No drainage, swelling or tenderness.  Bilateral cerumen impaction  Cardiovascular: Normal rate, regular rhythm and normal heart sounds.   Pulmonary/Chest: Effort normal and breath sounds normal.  Abdominal: Soft. Bowel sounds are normal. She exhibits no distension. There is no tenderness.  Skin: She is not diaphoretic.     Assessment/Plan 1.   Cerumen impaction, bilateral   Staff to administer debrox 5 drops into both ears BID for 4 days then irrigate bilaterally      Education given to Ms Sees not to stick objects into her ears to try to get the wax out-  educated on complications- she understands   2.   Unspecified constipation   Will have pt start taking senakot S BID for constipation  Stop colace

## 2012-09-08 ENCOUNTER — Non-Acute Institutional Stay (SKILLED_NURSING_FACILITY): Payer: Medicare Other | Admitting: Nurse Practitioner

## 2012-09-08 DIAGNOSIS — H6120 Impacted cerumen, unspecified ear: Secondary | ICD-10-CM

## 2012-09-08 DIAGNOSIS — K5900 Constipation, unspecified: Secondary | ICD-10-CM

## 2012-09-08 DIAGNOSIS — N390 Urinary tract infection, site not specified: Secondary | ICD-10-CM

## 2012-09-08 DIAGNOSIS — H6122 Impacted cerumen, left ear: Secondary | ICD-10-CM

## 2012-09-08 NOTE — Progress Notes (Signed)
Patient ID: Chelsey Gallagher, female   DOB: [birth year], 62 y.o.   MRN: [id number]  Nursing Home Location:  [facility]   Place of Service: SNF [phone])   Chief Complaint: Acute visit  HPI:  62 year old female with PMH of CVA, spastic hemiplegia recently seen for constipation and cerumen impaction is being seen today for follow-up.  Pt reports her right ear is clear from the debrox previously prescribed but left is still impacted.  Constipation has now improved On review of VS pt has a temp max of 101 in the last 48 hours. Pt reports her urine is cloudy and slightly burning more    Review of Systems:  Review of Systems  Constitutional: Positive for fever. Negative for chills and malaise/fatigue.  HENT: Positive for hearing loss. Negative for ear pain.   Respiratory: Negative for cough and shortness of breath.   Cardiovascular: Negative for chest pain, palpitations and leg swelling.  Gastrointestinal: Negative for nausea, vomiting, abdominal pain, diarrhea and constipation.  Genitourinary: Positive for dysuria and frequency. Negative for hematuria.  Musculoskeletal: Positive for myalgias.  Skin: Negative.   Neurological: Negative for weakness and headaches.     Medications: Patient's Medications  New Prescriptions   No medications on file  Previous Medications   ACETAMINOPHEN (TYLENOL) 325 MG TABLET    Take 650 mg by mouth every 6 (six) hours as needed for pain.   ASPIRIN EC 81 MG TABLET    Take 81 mg by mouth daily.   BACLOFEN (LIORESAL) 10 MG TABLET    Take 20 mg by mouth at bedtime.    BISACODYL (DULCOLAX) 10 MG SUPPOSITORY    Place 10 mg rectally as needed for constipation.   CITALOPRAM (CELEXA) 20 MG TABLET    Take 20 mg by mouth daily.   CLOPIDOGREL (PLAVIX) 75 MG TABLET    Take 75 mg by mouth daily.   DOCUSATE SODIUM (COLACE) 100 MG CAPSULE    Take 100 mg by mouth 2 (two) times daily. For constipation.   FERROUS SULFATE 325 (65 FE) MG TABLET    Take 325 mg by  mouth 2 (two) times daily with a meal.   GABAPENTIN (NEURONTIN) 300 MG CAPSULE    Take 300 mg by mouth at bedtime.   INSULIN GLARGINE (LANTUS) 100 UNIT/ML INJECTION    Inject 8 Units into the skin at bedtime.    LISINOPRIL (PRINIVIL,ZESTRIL) 20 MG TABLET    Take 10 mg by mouth 2 (two) times daily.    MAGNESIUM HYDROXIDE (MILK OF MAGNESIA) 800 MG/5ML SUSPENSION    Take by mouth daily as needed for constipation. 30cc   MIDODRINE (PROAMATINE) 5 MG TABLET    Take 5 mg by mouth 3 (three) times daily.   PRAVASTATIN (PRAVACHOL) 40 MG TABLET    Take 40 mg by mouth daily.    SODIUM PHOSPHATES (SALINE LAXATIVE) 0.9-2.4 GM/5ML SOLN    Take by mouth.   TRAMADOL (ULTRAM) 50 MG TABLET    Take 50 mg by mouth every 6 (six) hours as needed for pain.  Modified Medications   No medications on file  Discontinued Medications   No medications on file     Physical Exam:  Filed Vitals:   09/08/12 1209  BP: 130/63  Pulse: 67  Temp: 99.1 F (37.3 C)  Resp: 18   Physical Exam  Constitutional: She is oriented to person, place, and time and well-developed, well-nourished, and in no distress. No distress.  HENT:  Head:  Normocephalic and atraumatic.  Right Ear: Hearing, tympanic membrane, external ear and ear canal normal.  Mouth/Throat: Oropharynx is clear and moist. No oropharyngeal exudate.  Left sided cerumen impaction  Eyes: Conjunctivae and EOM are normal. Pupils are equal, round, and reactive to light.  Cardiovascular: Normal rate, regular rhythm and normal heart sounds.   Pulmonary/Chest: Effort normal and breath sounds normal. No respiratory distress. She has no wheezes.  Abdominal: Soft. Bowel sounds are normal. She exhibits no distension.  Neurological: She is alert and oriented to person, place, and time.  Skin: Skin is warm and dry. She is not diaphoretic.     Assessment/Plan UTI (urinary tract infection)- burning on urination with fevers; will get cbc, bmp, UA C&S at this  time  Unspecified constipation - improved  Cerumen impaction, left- cont debrox into left ear BID for 4 days then flush

## 2012-09-16 ENCOUNTER — Encounter: Payer: Self-pay | Admitting: Nurse Practitioner

## 2012-09-16 ENCOUNTER — Non-Acute Institutional Stay (SKILLED_NURSING_FACILITY): Payer: Medicare Other | Admitting: Nurse Practitioner

## 2012-09-16 DIAGNOSIS — N390 Urinary tract infection, site not specified: Secondary | ICD-10-CM

## 2012-09-16 DIAGNOSIS — D509 Iron deficiency anemia, unspecified: Secondary | ICD-10-CM

## 2012-09-16 DIAGNOSIS — G8110 Spastic hemiplegia affecting unspecified side: Secondary | ICD-10-CM

## 2012-09-16 DIAGNOSIS — I951 Orthostatic hypotension: Secondary | ICD-10-CM

## 2012-09-16 DIAGNOSIS — K5900 Constipation, unspecified: Secondary | ICD-10-CM

## 2012-09-16 NOTE — Progress Notes (Signed)
Patient ID: Chelsey Gallagher, female   DOB: 29-Dec-1950, 62 y.o.   MRN: [id number]  Nursing Home Location:  [facility]   Place of Service: SNF (31)  Chief Complaint  Patient presents with  . Medical Managment of Chronic Issues    HPI:  62 year old female with a pmh of CVA with spastic hemiplegia, constipation, anxiety, DM, HTN, and hyperlipidemia is being seen today for routine follow up. In the past month pt has been seen multiple times for cerumen impaction which has improved; constipation which has improved; she is currenly on bactrim for UTI and reports dysuria has improved Reassessment of ongoing issues:  Anemia- currently Rx iron due to anemia however she is refusing iron because of her constipation Constipation- improved on senakot S BID and miralax 17 gm daily Anxiety had worsened recently with increase in celexa to 40 mg  DM- stable on lantus Hyperlipidemia- tolerating pravastatin  Spastic hemiplegia- currently stable on gabapentin and baclofen  Review of Systems:  Review of Systems  Constitutional: Negative for fever, chills and malaise/fatigue.  Eyes: Negative for blurred vision and double vision.  Respiratory: Negative for cough and sputum production.   Cardiovascular: Negative for chest pain, palpitations and leg swelling.  Gastrointestinal: Negative for heartburn, abdominal pain, diarrhea and constipation (constipated at times).  Genitourinary: Negative for dysuria.  Musculoskeletal: Negative for myalgias.  Skin: Negative for itching and rash.  Neurological: Negative for weakness and headaches. Dizziness: at times - has orthostatic hypotension.       Spasms in lower extermities   Psychiatric/Behavioral: Negative for depression. The patient is not nervous/anxious.      Medications: Patient's Medications  New Prescriptions   No medications on file  Previous Medications   ACETAMINOPHEN (TYLENOL) 325 MG TABLET    Take 650 mg by mouth every 6 (six) hours  as needed for pain.   ASPIRIN EC 81 MG TABLET    Take 81 mg by mouth daily.   BACLOFEN (LIORESAL) 10 MG TABLET    Take 20 mg by mouth at bedtime.    BISACODYL (DULCOLAX) 10 MG SUPPOSITORY    Place 10 mg rectally as needed for constipation.   CITALOPRAM (CELEXA) 20 MG TABLET    Take 40 mg by mouth daily.    CLOPIDOGREL (PLAVIX) 75 MG TABLET    Take 75 mg by mouth daily.   DOCUSATE SODIUM (COLACE) 100 MG CAPSULE    Take 100 mg by mouth 2 (two) times daily. For constipation.   FERROUS SULFATE 325 (65 FE) MG TABLET    Take 325 mg by mouth 2 (two) times daily with a meal.   GABAPENTIN (NEURONTIN) 300 MG CAPSULE    Take 300 mg by mouth at bedtime.   INSULIN GLARGINE (LANTUS) 100 UNIT/ML INJECTION    Inject 8 Units into the skin at bedtime.    LISINOPRIL (PRINIVIL,ZESTRIL) 20 MG TABLET    Take 10 mg by mouth 2 (two) times daily.    MAGNESIUM HYDROXIDE (MILK OF MAGNESIA) 800 MG/5ML SUSPENSION    Take by mouth daily as needed for constipation. 30cc   MIDODRINE (PROAMATINE) 5 MG TABLET    Take 5 mg by mouth 3 (three) times daily.   PRAVASTATIN (PRAVACHOL) 40 MG TABLET    Take 40 mg by mouth daily.    SODIUM PHOSPHATES (SALINE LAXATIVE) 0.9-2.4 GM/5ML SOLN    Take by mouth.   TRAMADOL (ULTRAM) 50 MG TABLET    Take 50 mg by mouth every 6 (  six) hours as needed for pain.  Modified Medications   No medications on file  Discontinued Medications   No medications on file     Physical Exam:  Filed Vitals:   09/16/12 1707  BP: 142/64  Pulse: 68  Temp: 97.4 F (36.3 C)  Resp: 18   PE: Constitutional: She is oriented to person, place, and time and well-developed, well-nourished, and in no distress. No distress.  HENT:  Head: Normocephalic and atraumatic.  Right Ear: Hearing, tympanic membrane, external ear and ear canal normal.  Mouth/Throat: Oropharynx is clear and moist. No oropharyngeal exudate.  Eyes: Conjunctivae and EOM are normal. Pupils are equal, round, and reactive to light.   Cardiovascular: Normal rate, regular rhythm and normal heart sounds.  Pulmonary/Chest: Effort normal and breath sounds normal. No respiratory distress. She has no wheezes.  Abdominal: Soft. Bowel sounds are normal. She exhibits no distension.  Neurological: She is alert and oriented to person, place, and time.  Skin: Skin is warm and dry. She is not diaphoretic.    Labs reviewed/Significant Diagnostic Results: CBC with Diff       Result: 09/09/2012 11:28 AM    ( Status: F )            WBC  5.6        4.0-10.5  K/uL  SLN       RBC  3.53     L  3.87-5.11  MIL/uL  SLN       Hemoglobin  10.3     L  12.0-15.0  g/dL  SLN       Hematocrit  30.6     L  36.0-46.0  %  SLN       MCV  86.7        78.0-100.0  fL  SLN       MCH  29.2        26.0-34.0  pg  SLN       MCHC  33.7        30.0-36.0  g/dL  SLN       RDW  78.2        11.5-15.5  %  SLN       Platelet Count  146     L  150-400  K/uL  SLN       Granulocyte %  56        43-77  %  SLN       Absolute Gran  3.1        1.7-7.7  K/uL  SLN       Lymph %  30        12-46  %  SLN       Absolute Lymph  1.7        0.7-4.0  K/uL  SLN       Mono %  12        3-12  %  SLN       Absolute Mono  0.7        0.1-1.0  K/uL  SLN       Eos %  2        0-5  %  SLN       Absolute Eos  0.1        0.0-0.7  K/uL  SLN       Baso %  0        0-1  %  SLN       Absolute  Baso  0.0        0.0-0.1  K/uL  SLN       Smear Review  Criteria for review not met   SLN      Basic Metabolic Panel       Result: 09/09/2012 12:18 PM    ( Status: F )            Sodium  136        135-145  mEq/L  SLN       Potassium  3.9        3.5-5.3  mEq/L  SLN       Chloride  104        96-112  mEq/L  SLN       CO2  27        19-32  mEq/L  SLN       Glucose  97        70-99  mg/dL  SLN       BUN  21        6-23  mg/dL  SLN       Creatinine  0.68        0.50-1.10  mg/dL  SLN       Calcium  9.3        8.4-10.5  mg/dL  SLN           Assessment/Plan 1. Unspecified constipation Patients  constipation is stable; continue current regimen. Will monitor and make changes as necessary. 2. UTI (urinary tract infection) To finish bactrim- encouraged increase fluid intake 3. Spastic hemiplegia affecting dominant side Patient is stable; continue current regimen. Will monitor and make changes as necessary. 4. Orthostatic hypotension Stable; cont current medicaiton at this time 5. Anemia, iron deficiency Will cont to monitor since pt is not taking iron.

## 2012-10-18 ENCOUNTER — Other Ambulatory Visit: Payer: Self-pay | Admitting: Geriatric Medicine

## 2012-10-18 MED ORDER — TRAMADOL HCL 50 MG PO TABS: 50.0000 mg | ORAL_TABLET | Freq: Four times a day (QID) | ORAL | Status: DC | PRN

## 2012-10-27 ENCOUNTER — Encounter: Payer: Self-pay | Admitting: Nurse Practitioner

## 2012-10-27 ENCOUNTER — Other Ambulatory Visit: Payer: Self-pay | Admitting: *Deleted

## 2012-10-27 ENCOUNTER — Non-Acute Institutional Stay (SKILLED_NURSING_FACILITY): Payer: Medicare Other | Admitting: Nurse Practitioner

## 2012-10-27 DIAGNOSIS — F329 Major depressive disorder, single episode, unspecified: Secondary | ICD-10-CM

## 2012-10-27 DIAGNOSIS — G8110 Spastic hemiplegia affecting unspecified side: Secondary | ICD-10-CM

## 2012-10-27 DIAGNOSIS — F32A Depression, unspecified: Secondary | ICD-10-CM

## 2012-10-27 DIAGNOSIS — D509 Iron deficiency anemia, unspecified: Secondary | ICD-10-CM

## 2012-10-27 DIAGNOSIS — R52 Pain, unspecified: Secondary | ICD-10-CM

## 2012-10-27 DIAGNOSIS — E119 Type 2 diabetes mellitus without complications: Secondary | ICD-10-CM

## 2012-10-27 DIAGNOSIS — K5900 Constipation, unspecified: Secondary | ICD-10-CM

## 2012-10-27 MED ORDER — DULOXETINE HCL 30 MG PO CPEP: ORAL_CAPSULE | ORAL | Status: DC

## 2012-10-27 MED ORDER — TRAMADOL HCL 50 MG PO TABS: 100.0000 mg | ORAL_TABLET | Freq: Three times a day (TID) | ORAL | Status: DC | PRN

## 2012-10-27 NOTE — Progress Notes (Signed)
Patient ID: Chelsey Gallagher, female   DOB: June 17, 1950, 62 y.o.   MRN: [id number]  Nursing Home Location:  [facility]   Place of Service: SNF (31)  Chief Complaint  Patient presents with  . Medical Managment of Chronic Issues    HPI:  62 year old female with a pmh of CVA with spastic hemiplegia, constipation, anxiety, DM, HTN, and hyperlipidemia is being seen today for routine follow up.  Reassessment of ongoing issues:   Anemia- pt off iron Constipation- on senakot S BID and miralax 17 gm daily and constipation has improved greatly with medication and being off iron Anxiety unchanged with some depression that has worsened recently-- celexa was increased but pt does not feel like this is helping, she says she very bored and does not enjoy to do things she used to do. No thoughts of hurting herself or others    DM- stable on lantus Hyperlipidemia- tolerating pravastatin   Spastic hemiplegia- pain is worse, unable to sleep at night pt is on gabapentin and baclofen   Taking tramadol which helps Review of Systems:    DATA OBTAINED: from patient GENERAL:no fevers, fatigue, appetite changes SKIN: No itching, rash or wounds EYES: No eye pain, redness, discharge EARS: No earache, tinnitus, change in hearing NOSE: No congestion, drainage or bleeding  MOUTH/THROAT: No mouth or tooth pain, No sore throat, No difficulty chewing or swallowing  RESPIRATORY: No cough, wheezing, SOB CARDIAC: No chest pain, palpitations, lower extremity edema  GI: No abdominal pain, No N/V/D or constipation, No heartburn or reflux  GU: No dysuria, frequency or urgency, or incontinence  MUSCULOSKELETAL: No unrelieved bone/joint pain NEUROLOGIC: Awake, alert, appropriate to situation, No change in mental status.  PSYCHIATRIC: has anxiety and not sleeping well. No behavior issue.  AMBULATION:  WC   Medications: Patient's Medications  New Prescriptions   No medications on file  Previous  Medications   ACETAMINOPHEN (TYLENOL) 325 MG TABLET    Take 650 mg by mouth every 6 (six) hours as needed for pain.   ASPIRIN EC 81 MG TABLET    Take 81 mg by mouth daily.   BACLOFEN (LIORESAL) 10 MG TABLET    Take 20 mg by mouth at bedtime.    BISACODYL (DULCOLAX) 10 MG SUPPOSITORY    Place 10 mg rectally as needed for constipation.   CITALOPRAM (CELEXA) 20 MG TABLET    Take 40 mg by mouth daily.    CLOPIDOGREL (PLAVIX) 75 MG TABLET    Take 75 mg by mouth daily.   DOCUSATE SODIUM (COLACE) 100 MG CAPSULE    Take 100 mg by mouth 2 (two) times daily. For constipation.   FERROUS SULFATE 325 (65 FE) MG TABLET    Take 325 mg by mouth 2 (two) times daily with a meal.   GABAPENTIN (NEURONTIN) 300 MG CAPSULE    Take 300 mg by mouth at bedtime.   INSULIN GLARGINE (LANTUS) 100 UNIT/ML INJECTION    Inject 8 Units into the skin at bedtime.    LISINOPRIL (PRINIVIL,ZESTRIL) 20 MG TABLET    Take 10 mg by mouth 2 (two) times daily.    MAGNESIUM HYDROXIDE (MILK OF MAGNESIA) 800 MG/5ML SUSPENSION    Take by mouth daily as needed for constipation. 30cc   MIDODRINE (PROAMATINE) 5 MG TABLET    Take 5 mg by mouth 3 (three) times daily.   PRAVASTATIN (PRAVACHOL) 40 MG TABLET    Take 40 mg by mouth daily.    SODIUM  PHOSPHATES (SALINE LAXATIVE) 0.9-2.4 GM/5ML SOLN    Take by mouth.   TRAMADOL (ULTRAM) 50 MG TABLET    Take 1 tablet (50 mg total) by mouth every 6 (six) hours as needed for pain.  Modified Medications   No medications on file  Discontinued Medications   No medications on file     Physical Exam:  Filed Vitals:   10/27/12 1150  BP: 149/76  Pulse: 68  Temp: 98.6 F (37 C)  Resp: 20    GENERAL APPEARANCE: Alert, conversant. Appropriately groomed. No acute distress.  SKIN: No diaphoresis rash, or wounds HEAD: Normocephalic, atraumatic  EYES: Conjunctiva/lids clear. Pupils round, reactive. EOMs intact.  EARS: External exam WNL, canals clear. Hearing grossly normal.  NOSE: No deformity or  discharge.  MOUTH/THROAT: Lips w/o lesions. Mouth and throat normal. Tongue moist, w/o lesion.  NECK: No thyroid tenderness, enlargement or nodule  RESPIRATORY: Breathing is even, unlabored. Lung sounds are clear   CARDIOVASCULAR: Heart RRR no murmurs, rubs or gallops. No peripheral edema.  ARTERIAL: radial pulse 2+, DP pulse 1+  GASTROINTESTINAL: Abdomen is soft, non-tender, not distended w/ normal bowel sounds.GENITOURINARY: Bladder non tender, not distended  MUSCULOSKELETAL: pain to right shoulder NEURO: Oriented X3. Right sided hemiplegia  PSYCHIATRIC: Mood and affect appropriate to situation, no behavioral issues  Labs reviewed/Significant Diagnostic Results: CBC with Diff        Result: 09/09/2012 11:28 AM    ( Status: F )             WBC  5.6        4.0-10.5  K/uL  SLN        RBC  3.53     L  3.87-5.11  MIL/uL  SLN        Hemoglobin  10.3     L  12.0-15.0  g/dL  SLN        Hematocrit  30.6     L  36.0-46.0  %  SLN        MCV  86.7        78.0-100.0  fL  SLN        MCH  29.2        26.0-34.0  pg  SLN        MCHC  33.7        30.0-36.0  g/dL  SLN        RDW  09.6        11.5-15.5  %  SLN        Platelet Count  146     L  150-400  K/uL  SLN        Granulocyte %  56        43-77  %  SLN        Absolute Gran  3.1        1.7-7.7  K/uL  SLN        Lymph %  30        12-46  %  SLN        Absolute Lymph  1.7        0.7-4.0  K/uL  SLN        Mono %  12        3-12  %  SLN        Absolute Mono  0.7        0.1-1.0  K/uL  SLN        Eos %  2  0-5  %  SLN        Absolute Eos  0.1        0.0-0.7  K/uL  SLN        Baso %  0        0-1  %  SLN        Absolute Baso  0.0        0.0-0.1  K/uL  SLN        Smear Review  Criteria for review not met   SLN       Basic Metabolic Panel        Result: 1/61/0960 12:18 PM    ( Status: F )             Sodium  136        135-145  mEq/L  SLN        Potassium  3.9        3.5-5.3  mEq/L  SLN        Chloride  104        96-112  mEq/L  SLN        CO2   27        19-32  mEq/L  SLN        Glucose  97        70-99  mg/dL  SLN        BUN  21        6-23  mg/dL  SLN        Creatinine  0.68        0.50-1.10  mg/dL  SLN        Calcium  9.3        8.4-10.5  mg/dL  SLN           Assessment/Plan 1. Depression Will titrate and stop lexapro and add DULoxetine (CYMBALTA) 30 MG capsule; 30 mg daily for 1 week then increase to 60 mg, will get cbc, cmp, tsh  2. Pain Due to worsening pain cymbalta was added will also increase tramadol to 100 mg q 8 hours as needed  3. Unspecified constipation Improved will cont current medications  4. Diabetes mellitus stable  5. Spastic hemiplegia affecting dominant side Unchanged; reports she is working with therapy; with addition of tramadol and cymbalta expect better pain relief  6. Anemia, iron deficiency Off iron. Will follow up CBC

## 2012-10-28 ENCOUNTER — Other Ambulatory Visit: Payer: Self-pay | Admitting: *Deleted

## 2012-10-28 DIAGNOSIS — R52 Pain, unspecified: Secondary | ICD-10-CM

## 2012-10-28 MED ORDER — TRAMADOL HCL 50 MG PO TABS: 100.0000 mg | ORAL_TABLET | Freq: Three times a day (TID) | ORAL | Status: DC | PRN

## 2012-11-24 ENCOUNTER — Encounter: Payer: Self-pay | Admitting: Neurology

## 2012-11-24 ENCOUNTER — Ambulatory Visit (INDEPENDENT_AMBULATORY_CARE_PROVIDER_SITE_OTHER): Payer: Medicare Other | Admitting: Neurology

## 2012-11-24 VITALS — BP 131/72 | HR 83

## 2012-11-24 DIAGNOSIS — E119 Type 2 diabetes mellitus without complications: Secondary | ICD-10-CM

## 2012-11-24 DIAGNOSIS — I10 Essential (primary) hypertension: Secondary | ICD-10-CM

## 2012-11-24 DIAGNOSIS — D509 Iron deficiency anemia, unspecified: Secondary | ICD-10-CM

## 2012-11-24 DIAGNOSIS — I639 Cerebral infarction, unspecified: Secondary | ICD-10-CM

## 2012-11-24 DIAGNOSIS — E785 Hyperlipidemia, unspecified: Secondary | ICD-10-CM

## 2012-11-24 DIAGNOSIS — G8110 Spastic hemiplegia affecting unspecified side: Secondary | ICD-10-CM

## 2012-11-24 MED ORDER — ONABOTULINUMTOXINA 100 UNITS IJ SOLR
300.0000 [IU] | Freq: Once | INTRAMUSCULAR | Status: AC
  Administered 2012-11-24: 300 [IU] via INTRAMUSCULAR

## 2012-11-24 NOTE — Progress Notes (Signed)
History of Present Illness:  Chelsey Gallagher is an 62 y.o. RH female referred by [name] for evaluation of right spastic hemiparesis.  She suffered stroke in 05/18/2011 with sudden right hemiparesis and numbness. 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 Carotid ultrasound showed high grade stenosis versus occlusion of the left internal carotid artery which was confirmed on cerebral catheter angiogram. 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   She has multiple vascular risk factors down including diabetes, hyperlipidemia, hypertension, smoking, PVD, and carotid and intracranial atherosclerotic disease. Echocardiogram showed normal ejection fraction. Hemoglobin A1c was 10.2.  Rt CCA/ICA proximal stent assisted angioplasty with distal protection for severe stenosis as completed on 09/01/2011 by [name].  She had fallen multiple times at home, and realizes that she cannot live independently anymore, now she lives at [facility] rehab,  she is now having to use a hemiwalker with assistance. She is having significant bruising from being on aspirin and Plavix and from a recent fall. Her lipids remain strictly at uncontrolled and last known A1c was 5.3. This represents significant improvement from 10.2 at hospital admission. She plans on having her follow up diagnostic cerebral catheter angiogram by Dr. Corliss Skains to look for stent restenosis.   She lives at [place] rehab now. She has not slept well sometimes, difficulty to get comfortable, has to shift around, it is very difficult for her to sleep on her left side.    She had her first EMG guided BOTOX injection by [name] in June 19th, 2014, for total of 350 units to right upper extremity, right biceps 50, right flexor carpi radialis 50 units,  right flexor carpi ulnaris 50, right brachioradialis 50, right flexor digitorum profundus 50, right flexor digitorum superficialis 5 right palmaris longus 50,    Neurologic Exam: Mental Status: Alert, oriented, thought content appropriate.  Speech fluent without evidence of aphasia. Mild dysarthria.  She has hard of hearing  Cranial Nerves: II- Visual fields grossly intact, with both eyes open  III/IV/VI-Extraocular movements intact.  Pupils reactive bilaterally. V/VII-Smile symmetric VIII-hearing grossly intact IX/X-normal gag XI-bilateral shoulder shrug, decreased on R XII-midline tongue extension Motor: Spastic right hemiparesis with right shoulder anterior rotation, mild right elbow flexion, pronation, right wrist flexion, finger flexion, thumbed in position, right shoulder external rotation 3, right elbow flexion 3, elbow extension 3-, right wrist extension 1, right wrist flexion 2, with passive movement, I was able to straight out her right fingers, . Right plantar foot inversion, right hip flexion 3, knee flexion 3+, knee extension 4, right ankle dorsiflexion 3, right ankle plantar flexion 3+.  Sensory: diminished sensation on the right. Deep Tendon Reflexes: 2+ and symmetric throughout, 0 achilles Cerebellar: Normal finger-to-nose L, normal rapid alternating movements L.  Unable to perform on the right. Gait and station not tested as sitting in a wheelchair.  Assessment:  62 y.o. 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  Electrode stimulation guided  right upper extremity injection for spastic right upper extremity spasticity.She is also enrolled in [employer] adult spasticity international registry 100  units of Botox A was  dissolved into 2 cc of normal saline ( Lot No [id number] Exp May 2017)  Right pectoralis major 50 units Right teres major 50 Right palmaris longus 25 Right flexor digitorum profundus 25 Right flexor carpi ulnaris 25 Right biceps 50 Right brachialis 50 Right pronator teres 25 units  She will return to clinic in 3 months for repeat injection, will increase her Botox to 400 units next time

## 2012-11-25 ENCOUNTER — Encounter: Payer: Self-pay | Admitting: Nurse Practitioner

## 2012-11-25 ENCOUNTER — Non-Acute Institutional Stay (SKILLED_NURSING_FACILITY): Payer: Medicare Other | Admitting: Nurse Practitioner

## 2012-11-25 DIAGNOSIS — D509 Iron deficiency anemia, unspecified: Secondary | ICD-10-CM

## 2012-11-25 DIAGNOSIS — K5900 Constipation, unspecified: Secondary | ICD-10-CM

## 2012-11-25 DIAGNOSIS — B9789 Other viral agents as the cause of diseases classified elsewhere: Secondary | ICD-10-CM

## 2012-11-25 DIAGNOSIS — B348 Other viral infections of unspecified site: Secondary | ICD-10-CM

## 2012-11-25 DIAGNOSIS — E119 Type 2 diabetes mellitus without complications: Secondary | ICD-10-CM

## 2012-11-25 DIAGNOSIS — F411 Generalized anxiety disorder: Secondary | ICD-10-CM

## 2012-11-25 DIAGNOSIS — G8110 Spastic hemiplegia affecting unspecified side: Secondary | ICD-10-CM

## 2012-11-25 NOTE — Progress Notes (Signed)
Patient ID: Chelsey Gallagher, female   DOB: 02/21/1951, 61 y.o.   MRN: [id number]  Nursing Home Location:  [facility]   Place of Service: SNF (31)  Chief Complaint  Patient presents with  . Medical Managment of Chronic Issues    HPI:  62 year old female with a pmh of CVA with spastic hemiplegia, constipation, anxiety, DM, HTN, and hyperlipidemia is being seen today for routine follow up.  Pt reports sore throat for 4 days; no fevers or chills; some nasal congestion with clear nasal drainage, no shortness of breath, cough or chest pains Reassessment of ongoing issues:  Anemia- pt refusing iron due to constipation  Constipation- stable on senakot S BID and miralax 17 gm daily and constipation has improved greatly with medication and refusing off iron  Anxiety with some depression that had worsened celexa was stopped and she has been on cymbalta for a month does not notice much change if any DM- pt with noted weight gain and since fasting blood sugars have been worse; on lantus 8 units qhs Hyperlipidemia- tolerating pravastatin  Spastic hemiplegia-pt is on gabapentin and baclofen  Taking tramadol which helps   Review of Systems:  DATA OBTAINED: from patient GENERAL:no fevers, fatigue, appetite changes SKIN: No itching, rash or wounds EYES: No eye pain, redness, discharge EARS: No earache, tinnitus, change in hearing NOSE: No congestion, drainage or bleeding   MOUTH/THROAT: No mouth or tooth pain, No sore throat, No difficulty chewing or swallowing   RESPIRATORY: No cough, wheezing, SOB CARDIAC: No chest pain, palpitations, lower extremity edema   GI: No abdominal pain, No N/V/D or constipation, No heartburn or reflux   GU: No dysuria, frequency or urgency, or incontinence   MUSCULOSKELETAL: No unrelieved bone/joint pain NEUROLOGIC: Awake, alert, appropriate to situation, No change in mental status.   PSYCHIATRIC: has anxiety and not sleeping well. No behavior issue.   AMBULATION:  WC    Medications: Patient's Medications  New Prescriptions   No medications on file  Previous Medications   ACETAMINOPHEN (TYLENOL) 325 MG TABLET    Take 650 mg by mouth every 6 (six) hours as needed for pain.   ASPIRIN EC 81 MG TABLET    Take 81 mg by mouth daily.   BACLOFEN (LIORESAL) 10 MG TABLET    Take 5 mg by mouth 2 (two) times daily. 5 mg at 8a and 2p and 20 mg at hs   BISACODYL (DULCOLAX) 10 MG SUPPOSITORY    Place 10 mg rectally as needed for constipation.   CLOPIDOGREL (PLAVIX) 75 MG TABLET    Take 75 mg by mouth daily.   FERROUS SULFATE 325 (65 FE) MG TABLET    Take 325 mg by mouth 2 (two) times daily with a meal.   GABAPENTIN (NEURONTIN) 300 MG CAPSULE    Take 300 mg by mouth at bedtime.   INSULIN GLARGINE (LANTUS) 100 UNIT/ML INJECTION    Inject 8 Units into the skin at bedtime.    LISINOPRIL (PRINIVIL,ZESTRIL) 20 MG TABLET    Take 10 mg by mouth 2 (two) times daily.    MAGNESIUM HYDROXIDE (MILK OF MAGNESIA) 800 MG/5ML SUSPENSION    Take by mouth daily as needed for constipation. 30cc   MIDODRINE (PROAMATINE) 5 MG TABLET    Take 5 mg by mouth 3 (three) times daily.   POLYETHYLENE GLYCOL (MIRALAX / GLYCOLAX) PACKET    Take 17 g by mouth daily.   PRAVASTATIN (PRAVACHOL) 40 MG TABLET  Take 40 mg by mouth daily.    SENNOSIDES-DOCUSATE SODIUM (SENOKOT-S) 8.6-50 MG TABLET    Take 1 tablet by mouth 2 (two) times daily.   TRAMADOL (ULTRAM) 50 MG TABLET    Take 2 tablets (100 mg total) by mouth every 8 (eight) hours as needed for pain.   VITAMIN D, ERGOCALCIFEROL, (DRISDOL) 50000 UNITS CAPS CAPSULE    Take 50,000 Units by mouth every 7 (seven) days.  Modified Medications   Modified Medication Previous Medication   DULOXETINE (CYMBALTA) 30 MG CAPSULE DULoxetine (CYMBALTA) 30 MG capsule      Take 60 mg by mouth daily.    30 mg daily for 1 week then increase to 60 mg  Discontinued Medications   CITALOPRAM (CELEXA) 20 MG TABLET    Take 40 mg by mouth daily.     DOCUSATE SODIUM (COLACE) 100 MG CAPSULE    Take 100 mg by mouth 2 (two) times daily. For constipation.   SODIUM PHOSPHATES (SALINE LAXATIVE) 0.9-2.4 GM/5ML SOLN    Take by mouth.     Physical Exam:  Filed Vitals:   11/25/12 2102  BP: 115/71  Pulse: 60  Temp: 98.1 F (36.7 C)  Resp: 20  Weight: 150 lb (68.04 kg)    GENERAL APPEARANCE: Alert, conversant. Appropriately groomed. No acute distress.  SKIN: No diaphoresis rash, or wounds HEAD: Normocephalic, atraumatic   EYES: Conjunctiva/lids clear. Pupils round, reactive. EOMs intact.   EARS: External exam WNL. Hearing grossly normal.   NOSE: No deformity or discharge.   MOUTH/THROAT: Lips w/o lesions. Mouth and throat normal. Tongue moist, w/o lesion.   NECK: No thyroid tenderness, enlargement or nodule   RESPIRATORY: Breathing is even, unlabored. Lung sounds are clear    CARDIOVASCULAR: Heart RRR no murmurs, rubs or gallops. No peripheral edema.   ARTERIAL: radial pulse 2+ GASTROINTESTINAL: Abdomen is soft, non-tender, not distended w/ normal bowel sounds. GENITOURINARY: Bladder non tender, not distended   MUSCULOSKELETAL: pain to right shoulder; with recent botox injections NEURO: Oriented X3. Right sided hemiplegia   PSYCHIATRIC: Mood and affect appropriate to situation, no behavioral issues   Labs reviewed/Significant Diagnostic Results: CBC with Diff  Result: 09/09/2012 11:28 AM ( Status: F )  WBC 5.6 4.0-10.5 K/uL SLN  RBC 3.53 L 3.87-5.11 MIL/uL SLN  Hemoglobin 10.3 L 12.0-15.0 g/dL SLN  Hematocrit 56.2 L 36.0-46.0 % SLN  MCV 86.7 78.0-100.0 fL SLN  MCH 29.2 26.0-34.0 pg SLN  MCHC 33.7 30.0-36.0 g/dL SLN  RDW 13.0 86.5-78.4 % SLN  Platelet Count 146 L 150-400 K/uL SLN  Granulocyte % 56 43-77 % SLN  Absolute Gran 3.1 1.7-7.7 K/uL SLN  Lymph % 30 12-46 % SLN  Absolute Lymph 1.7 0.7-4.0 K/uL SLN  Mono % 12 3-12 % SLN  Absolute Mono 0.7 0.1-1.0 K/uL SLN  Eos % 2 0-5 % SLN  Absolute Eos 0.1 0.0-0.7 K/uL SLN  Baso %  0 0-1 % SLN  Absolute Baso 0.0 0.0-0.1 K/uL SLN  Smear Review Criteria for review not met SLN  Basic Metabolic Panel  Result: 09/09/2012 12:18 PM ( Status: F )  Sodium 136 135-145 mEq/L SLN  Potassium 3.9 3.5-5.3 mEq/L SLN  Chloride 104 96-112 mEq/L SLN  CO2 27 19-32 mEq/L SLN  Glucose 97 70-99 mg/dL SLN  BUN 21 6-96 mg/dL SLN  Creatinine 2.95 2.84-1.32 mg/dL SLN  Calcium 9.3 4.4-01.0 mg/dL SLN    Comprehensive Metabolic Panel    Result: 11/03/2012 12:23 PM   ( Status: F )  Sodium 137     135-145 mEq/L SLN   Potassium 4.4     3.5-5.3 mEq/L SLN   Chloride 99     96-112 mEq/L SLN   CO2 27     19-32 mEq/L SLN   Glucose 129   H 70-99 mg/dL SLN   BUN 20     1-61 mg/dL SLN   Creatinine 0.96     0.50-1.10 mg/dL SLN   Bilirubin, Total 0.4     0.3-1.2 mg/dL SLN   Alkaline Phosphatase 68     39-117 U/L SLN   AST/SGOT 16     0-37 U/L SLN   ALT/SGPT 20     0-35 U/L SLN   Total Protein 6.8     6.0-8.3 g/dL SLN   Albumin 3.7     0.4-5.4 g/dL SLN   Calcium 9.6     0.9-81.1 mg/dL SLN   TSH, Ultrasensitive    Result: 11/03/2012 1:03 PM   ( Status: F )       TSH 1.277     0.350-4.500 uIU/mL SLN   Vitamin D (25-Hydroxy)    Result: 11/03/2012 1:50 PM   ( Status: F )       Vitamin D (25-Hydroxy) 17   L 30-89 ng/mL SLN C  Test Description Results   Abnormal Reference Range Units Lab Note CBC NO Diff (Complete Blood Count)    Result: 10/28/2012 2:26 PM   ( Status: F )       WBC 5.5     4.0-10.5 K/uL SLN   RBC 3.80   L 3.87-5.11 MIL/uL SLN   Hemoglobin 11.4   L 12.0-15.0 g/dL SLN   Hematocrit 91.4   L 36.0-46.0 % SLN   MCV 89.7     78.0-100.0 fL SLN   MCH 30.0     26.0-34.0 pg SLN   MCHC 33.4     30.0-36.0 g/dL SLN   RDW 78.2     95.6-21.3 % SLN   Platelet Count 153     150-400 K/uL SLN   Comprehensive Metabolic Panel    Result: 10/28/2012 4:06 PM   ( Status: F )       Sodium 138     135-145 mEq/L SLN   Potassium 4.0     3.5-5.3 mEq/L SLN   Chloride 105     96-112 mEq/L SLN   CO2 25      19-32 mEq/L SLN   Glucose 163   H 70-99 mg/dL SLN   BUN 25   H 0-86 mg/dL SLN   Creatinine 5.78     0.50-1.10 mg/dL SLN   Bilirubin, Total 0.3     0.3-1.2 mg/dL SLN   Alkaline Phosphatase 63     39-117 U/L SLN   AST/SGOT 24     0-37 U/L SLN   ALT/SGPT 25     0-35 U/L SLN   Total Protein 6.6     6.0-8.3 g/dL SLN   Albumin 3.7     4.6-9.6 g/dL SLN   Calcium 9.3     2.9-52.8 mg/dL SLN   TSH, Ultrasensitive    Result: 10/28/2012 4:04 PM   ( Status: F )       TSH 1.086     0.350-4.500 uIU/mL SLN   Vitamin D (25-Hydroxy)    Result: 10/29/2012 1:32 AM   ( Status: F )       Vitamin D (25-Hydroxy) 18  Assessment/Plan 1. Spastic hemiplegia affecting dominant side Stable;  pt needs DME MAFO for right leg for self donning and pressure reductiondue to diabetes   2. Unspecified constipation Stable on current medications  3. Diabetes mellitus Fasting blood sugars worse; will get A1c and have staff get cbgs ACHS  4. Anemia, iron deficiency Will stop iron per pts request and follow up CBC in 1 month  5. Anxiety state, unspecified Will get psych consult   6. Rhinovirus Supportive care at this time; no signs of bacterial infections;  to notify if worsens or persist after 7 days; fevers or chills occurs

## 2012-11-29 ENCOUNTER — Non-Acute Institutional Stay (SKILLED_NURSING_FACILITY): Payer: Medicare Other | Admitting: Nurse Practitioner

## 2012-11-29 DIAGNOSIS — J309 Allergic rhinitis, unspecified: Secondary | ICD-10-CM

## 2012-11-29 DIAGNOSIS — E119 Type 2 diabetes mellitus without complications: Secondary | ICD-10-CM

## 2012-11-29 NOTE — Progress Notes (Signed)
Patient ID: Chelsey Gallagher, female   DOB: 11-22-1950, 62 y.o.   MRN: [id number]  Nursing Home Location:  [facility]   Place of Service: SNF (31)  Chief Complaint  Patient presents with  . Acute Visit    elevation in blood sugars and allergies    HPI:  62 year old female with pmh of diabetes, cva, htn is being seen today due to increase in blood sugars and ongoing allergies. Pt was recently seen for routine follow up and CBGs were scheduled ACHS since then blood sugars have ranged from 250-400s;requiring per protocol insulin. Pt reports she has had weight gain and good appetite over the past month blood sugars have gotten worse. No noted hypoglycemia  pts metformin was stopped during hospitalization in April due to good glycemic control.   Pt notes throat is still sore; no worse, reports due to postnasal drip, and has itchy eyes. No fever or chills, no shortness of breath, no nose or chest congestion noted.  Review of Systems:  DATA OBTAINED: from patient  GENERAL:no fevers, fatigue, appetite changes  SKIN: No itching, rash or wounds  EYES: No eye pain, redness; reports clear discharge and itching EARS: No earache, tinnitus, change in hearing  NOSE: No congestion, drainage or bleeding  MOUTH/THROAT: No mouth or tooth pain, has sore throat, No difficulty chewing or swallowing  RESPIRATORY: No cough, wheezing, SOB  CARDIAC: No chest pain, palpitations, lower extremity edema  GI: No abdominal pain, No N/V/D or constipation, No heartburn or reflux  GU: No dysuria, frequency or urgency. MUSCULOSKELETAL: No unrelieved bone/joint pain  NEUROLOGIC: Awake, alert, appropriate to situation, No change in mental status.  AMBULATION: WC    Medications: Patient's Medications  New Prescriptions   No medications on file  Previous Medications   ACETAMINOPHEN (TYLENOL) 325 MG TABLET    Take 650 mg by mouth every 6 (six) hours as needed for pain.   ASPIRIN EC 81 MG TABLET    Take  81 mg by mouth daily.   BACLOFEN (LIORESAL) 10 MG TABLET    Take 5 mg by mouth 2 (two) times daily. 5 mg at 8a and 2p and 20 mg at hs   BISACODYL (DULCOLAX) 10 MG SUPPOSITORY    Place 10 mg rectally as needed for constipation.   CLOPIDOGREL (PLAVIX) 75 MG TABLET    Take 75 mg by mouth daily.   DULOXETINE (CYMBALTA) 30 MG CAPSULE    Take 60 mg by mouth daily.   GABAPENTIN (NEURONTIN) 300 MG CAPSULE    Take 300 mg by mouth at bedtime.   INSULIN GLARGINE (LANTUS) 100 UNIT/ML INJECTION    Inject 8 Units into the skin at bedtime.    LISINOPRIL (PRINIVIL,ZESTRIL) 20 MG TABLET    Take 10 mg by mouth 2 (two) times daily.    MAGNESIUM HYDROXIDE (MILK OF MAGNESIA) 800 MG/5ML SUSPENSION    Take by mouth daily as needed for constipation. 30cc   MIDODRINE (PROAMATINE) 5 MG TABLET    Take 5 mg by mouth 3 (three) times daily.   POLYETHYLENE GLYCOL (MIRALAX / GLYCOLAX) PACKET    Take 17 g by mouth daily.   PRAVASTATIN (PRAVACHOL) 40 MG TABLET    Take 40 mg by mouth daily.    SENNOSIDES-DOCUSATE SODIUM (SENOKOT-S) 8.6-50 MG TABLET    Take 1 tablet by mouth 2 (two) times daily.   TRAMADOL (ULTRAM) 50 MG TABLET    Take 2 tablets (100 mg total) by mouth every 8 (  eight) hours as needed for pain.   VITAMIN D, ERGOCALCIFEROL, (DRISDOL) 50000 UNITS CAPS CAPSULE    Take 50,000 Units by mouth every 7 (seven) days.  Modified Medications   No medications on file  Discontinued Medications   FERROUS SULFATE 325 (65 FE) MG TABLET    Take 325 mg by mouth 2 (two) times daily with a meal.     Physical Exam:  Filed Vitals:   11/29/12 1106  BP: 114/68  Pulse: 70  Temp: 96.3 F (35.7 C)  Resp: 18    GENERAL APPEARANCE: Alert, conversant. Appropriately groomed. No acute distress.  SKIN: No diaphoresis rash, or wounds  HEAD: Normocephalic, atraumatic  EYES: Conjunctiva/lids clear. Pupils round, reactive. EOMs intact.  EARS: External exam WNL. Hearing grossly normal.  NOSE: No deformity or discharge.  MOUTH/THROAT:  Lips w/o lesions. Mouth and throat normal. Tongue moist, w/o lesion.  NECK: No thyroid tenderness, enlargement or nodule  RESPIRATORY: Breathing is even, unlabored. Lung sounds are clear  CARDIOVASCULAR: Heart RRR no murmurs, rubs or gallops. No peripheral edema.  ARTERIAL: radial pulse 2+  GASTROINTESTINAL: Abdomen is soft, non-tender, not distended w/ normal bowel sounds.  GENITOURINARY: Bladder non tender, not distended  MUSCULOSKELETAL: pain to right shoulder; with recent botox injections  NEURO: Oriented X3. Right sided hemiplegia    Labs reviewed/Significant Diagnostic Results: CBC with Diff  Result: 09/09/2012 11:28 AM ( Status: F )  WBC 5.6 4.0-10.5 K/uL SLN  RBC 3.53 L 3.87-5.11 MIL/uL SLN  Hemoglobin 10.3 L 12.0-15.0 g/dL SLN  Hematocrit 16.1 L 36.0-46.0 % SLN  MCV 86.7 78.0-100.0 fL SLN  MCH 29.2 26.0-34.0 pg SLN  MCHC 33.7 30.0-36.0 g/dL SLN  RDW 09.6 04.5-40.9 % SLN  Platelet Count 146 L 150-400 K/uL SLN  Granulocyte % 56 43-77 % SLN  Absolute Gran 3.1 1.7-7.7 K/uL SLN  Lymph % 30 12-46 % SLN  Absolute Lymph 1.7 0.7-4.0 K/uL SLN  Mono % 12 3-12 % SLN  Absolute Mono 0.7 0.1-1.0 K/uL SLN  Eos % 2 0-5 % SLN  Absolute Eos 0.1 0.0-0.7 K/uL SLN  Baso % 0 0-1 % SLN  Absolute Baso 0.0 0.0-0.1 K/uL SLN  Smear Review Criteria for review not met SLN  Basic Metabolic Panel  Result: 09/09/2012 12:18 PM ( Status: F )  Sodium 136 135-145 mEq/L SLN  Potassium 3.9 3.5-5.3 mEq/L SLN  Chloride 104 96-112 mEq/L SLN  CO2 27 19-32 mEq/L SLN  Glucose 97 70-99 mg/dL SLN  BUN 21 8-11 mg/dL SLN  Creatinine 9.14 7.82-9.56 mg/dL SLN  Calcium 9.3 2.1-30.8 mg/dL SLN    Comprehensive Metabolic Panel  Result: 11/03/2012 12:23 PM ( Status: F )  Sodium 137 135-145 mEq/L SLN  Potassium 4.4 3.5-5.3 mEq/L SLN  Chloride 99 96-112 mEq/L SLN  CO2 27 19-32 mEq/L SLN  Glucose 129 H 70-99 mg/dL SLN  BUN 20 6-57 mg/dL SLN  Creatinine 8.46 9.62-9.52 mg/dL SLN  Bilirubin, Total 0.4 0.3-1.2 mg/dL  SLN  Alkaline Phosphatase 68 39-117 U/L SLN  AST/SGOT 16 0-37 U/L SLN  ALT/SGPT 20 0-35 U/L SLN  Total Protein 6.8 6.0-8.3 g/dL SLN  Albumin 3.7 8.4-1.3 g/dL SLN  Calcium 9.6 2.4-40.1 mg/dL SLN  TSH, Ultrasensitive  Result: 11/03/2012 1:03 PM ( Status: F )  TSH 1.277 0.350-4.500 uIU/mL SLN  Vitamin D (25-Hydroxy)  Result: 11/03/2012 1:50 PM ( Status: F )  Vitamin D (25-Hydroxy) 17 L 30-89 ng/mL SLN C  Test Description Results Abnormal Reference Range Units Lab Note  CBC NO Diff (  Complete Blood Count)  Result: 10/28/2012 2:26 PM ( Status: F )  WBC 5.5 4.0-10.5 K/uL SLN  RBC 3.80 L 3.87-5.11 MIL/uL SLN  Hemoglobin 11.4 L 12.0-15.0 g/dL SLN  Hematocrit 16.1 L 36.0-46.0 % SLN  MCV 89.7 78.0-100.0 fL SLN  MCH 30.0 26.0-34.0 pg SLN  MCHC 33.4 30.0-36.0 g/dL SLN  RDW 09.6 04.5-40.9 % SLN  Platelet Count 153 150-400 K/uL SLN  Comprehensive Metabolic Panel  Result: 10/28/2012 4:06 PM ( Status: F )  Sodium 138 135-145 mEq/L SLN  Potassium 4.0 3.5-5.3 mEq/L SLN  Chloride 105 96-112 mEq/L SLN  CO2 25 19-32 mEq/L SLN  Glucose 163 H 70-99 mg/dL SLN  BUN 25 H 8-11 mg/dL SLN  Creatinine 9.14 7.82-9.56 mg/dL SLN  Bilirubin, Total 0.3 0.3-1.2 mg/dL SLN  Alkaline Phosphatase 63 39-117 U/L SLN  AST/SGOT 24 0-37 U/L SLN  ALT/SGPT 25 0-35 U/L SLN  Total Protein 6.6 6.0-8.3 g/dL SLN  Albumin 3.7 2.1-3.0 g/dL SLN  Calcium 9.3 8.6-57.8 mg/dL SLN  TSH, Ultrasensitive  Result: 10/28/2012 4:04 PM ( Status: F )  TSH 1.086 0.350-4.500 uIU/mL SLN  Vitamin D (25-Hydroxy)  Result: 10/29/2012 1:32 AM ( Status: F )  Vitamin D (25-Hydroxy) 18   Assessment/Plan  1. Diabetes mellitus Blood sugars worse; A1c pending but expect will be higher than previous; will resume metformin 500 mg BID and will have staff give 5 units novolog for blood sugars over 150; will follow up BMP in 1 month  2. Allergic rhinitis Will start Claritin 10 mg PO daily for 2 weeks to help with allergies

## 2012-12-20 ENCOUNTER — Non-Acute Institutional Stay (SKILLED_NURSING_FACILITY): Payer: Medicare Other | Admitting: Nurse Practitioner

## 2012-12-20 ENCOUNTER — Ambulatory Visit: Payer: Medicare Other | Admitting: Physical Medicine & Rehabilitation

## 2012-12-20 DIAGNOSIS — E559 Vitamin D deficiency, unspecified: Secondary | ICD-10-CM

## 2012-12-20 DIAGNOSIS — K5900 Constipation, unspecified: Secondary | ICD-10-CM

## 2012-12-20 DIAGNOSIS — R109 Unspecified abdominal pain: Secondary | ICD-10-CM

## 2012-12-20 NOTE — Progress Notes (Signed)
Patient ID: Chelsey Gallagher, female   DOB: 04/16/1950, 62 y.o.   MRN: [id number]   PCP: Kimber Relic, MD    Allergies  Allergen Reactions  . Codeine Other (See Comments)    Per Mar  . Flexeril [Cyclobenzaprine Hcl] Other (See Comments)    Per Mar  . Lyrica [Pregabalin] Other (See Comments)    "wires my up"    Chief Complaint  Patient presents with  . Acute Visit    HPI:  62 year old female with pmh of diabetes, cva, htn is being seen today due to worsening abdominal pain and no BM in 4 days. Pt reports pain in right lower quadrant worse when she presses the affected area; pain throughout right side, does not radiate into other areas of the abdomen. No fevers or chills, no shortness of breath or chest pain,  No nausea, vomiting, or diarrhea    Review of Systems:  Review of Systems  Constitutional: Negative for fever, chills, weight loss and malaise/fatigue.  Eyes: Negative.   Respiratory: Negative for shortness of breath.   Cardiovascular: Negative for chest pain and leg swelling.  Gastrointestinal: Positive for abdominal pain and constipation. Negative for heartburn, nausea, vomiting, diarrhea, blood in stool and melena.       Negative for impaction  Genitourinary: Negative for dysuria, urgency and frequency.  Skin: Negative.   Neurological: Negative for dizziness, weakness and headaches.     Past Medical History  Diagnosis Date  . Diabetes mellitus   . Hypertension   . Depression   . PAD (peripheral artery disease)     S/p bypass grafting, unclear exactly where  . Nephrolithiasis   . Hyperlipidemia 05/14/2011  . Hearing loss   . Complication of anesthesia     doesn't wake up good- "usually end up in ICU"  . PONV (postoperative nausea and vomiting)   . Orthostatic hypotension     after surgery.  Marland Kitchen Heart murmur     PCP- [facility]- No tx needed.  . Stroke 05/2011    Weakness on Right. Wears leg brace.  . Foot drop, left   . H/O hiatal hernia      second one  . Neuromuscular disorder     Diabetic neuropathy- has inproved since she quit smoking.  . Arthritis    Past Surgical History  Procedure Laterality Date  . Total hip arthroplasty  2008    Right  . Abdominal hysterectomy  2003  . Cesarean section  1984  . Cholecystectomy  1984  . Bypass graft  2003    Abdominal aortic  . Kidney stone surgery  1999  . Appendectomy  2011  . Hernia repair  2011  . Lithotripsy    . Abdominal hysterectomy     Social History:   reports that she has quit smoking. Her smoking use included Cigarettes. She smoked 0.00 packs per day for 45 years. She quit smokeless tobacco use about 19 months ago. She reports that she does not drink alcohol or use illicit drugs.  Family History  Problem Relation Age of Onset  . Cancer Maternal Aunt     breast    Medications: Patient's Medications  New Prescriptions   No medications on file  Previous Medications   ACETAMINOPHEN (TYLENOL) 325 MG TABLET    Take 650 mg by mouth every 6 (six) hours as needed for pain.   ASPIRIN EC 81 MG TABLET    Take 81 mg by mouth daily.   BACLOFEN (  LIORESAL) 10 MG TABLET    Take 5 mg by mouth 2 (two) times daily. 5 mg at 8a and 2p and 20 mg at hs   BISACODYL (DULCOLAX) 10 MG SUPPOSITORY    Place 10 mg rectally as needed for constipation.   CLOPIDOGREL (PLAVIX) 75 MG TABLET    Take 75 mg by mouth daily.   DULOXETINE (CYMBALTA) 30 MG CAPSULE    Take 60 mg by mouth daily.   GABAPENTIN (NEURONTIN) 300 MG CAPSULE    Take 300 mg by mouth at bedtime.   INSULIN GLARGINE (LANTUS) 100 UNIT/ML INJECTION    Inject 8 Units into the skin at bedtime.    LISINOPRIL (PRINIVIL,ZESTRIL) 20 MG TABLET    Take 10 mg by mouth 2 (two) times daily.    MAGNESIUM HYDROXIDE (MILK OF MAGNESIA) 800 MG/5ML SUSPENSION    Take by mouth daily as needed for constipation. 30cc   METFORMIN (GLUCOPHAGE) 500 MG TABLET    Take 500 mg by mouth 2 (two) times daily with a meal.   MIDODRINE (PROAMATINE) 5 MG TABLET     Take 5 mg by mouth 3 (three) times daily.   POLYETHYLENE GLYCOL (MIRALAX / GLYCOLAX) PACKET    Take 17 g by mouth daily.   PRAVASTATIN (PRAVACHOL) 40 MG TABLET    Take 40 mg by mouth daily.    SENNOSIDES-DOCUSATE SODIUM (SENOKOT-S) 8.6-50 MG TABLET    Take 1 tablet by mouth 2 (two) times daily.   TRAMADOL (ULTRAM) 50 MG TABLET    Take 2 tablets (100 mg total) by mouth every 8 (eight) hours as needed for pain.  Modified Medications   No medications on file  Discontinued Medications   VITAMIN D, ERGOCALCIFEROL, (DRISDOL) 50000 UNITS CAPS CAPSULE    Take 50,000 Units by mouth every 7 (seven) days.     Physical Exam: Physical Exam  Constitutional: She is oriented to person, place, and time and well-developed, well-nourished, and in no distress. No distress.  Cardiovascular: Normal rate, regular rhythm and normal heart sounds.   Pulmonary/Chest: Effort normal and breath sounds normal.  Abdominal: Soft. Bowel sounds are normal. She exhibits distension. She exhibits no mass. There is tenderness (to right quad). There is no rebound and no guarding.  Neurological: She is alert and oriented to person, place, and time.  Skin: Skin is warm and dry. She is not diaphoretic.    Filed Vitals:   12/20/12 1642  BP: 132/81  Pulse: 94  Temp: 98.1 F (36.7 C)  Resp: 20      Labs reviewed: Basic Metabolic Panel:  Recent Labs  96/04/54 0345 04/06/12 0430 04/07/12 0435  NA 133* 135 135  K 4.3 4.3 3.7  CL 101 104 100  CO2 23 25 26   GLUCOSE 87 92 120*  BUN 48* 28* 18  CREATININE 0.93 0.84 0.62  CALCIUM 8.9 9.0 9.8   Liver Function Tests:  Recent Labs  04/05/12 0345 04/06/12 0430 04/07/12 0435  AST 21 24 22   ALT 16 18 19   ALKPHOS 60 62 79  BILITOT 0.1* 0.2* 0.3  PROT 5.9* 6.3 7.4  ALBUMIN 3.0* 3.1* 3.7   No results found for this basename: LIPASE, AMYLASE,  in the last 8760 hours No results found for this basename: AMMONIA,  in the last 8760 hours CBC:  Recent Labs   04/04/12 2144  04/05/12 1016 04/06/12 0430 04/07/12 0435  WBC 6.3  < > 6.6 5.7 7.1  NEUTROABS 4.0  --   --   --   --  HGB 8.4*  < > 9.1* 9.6* 11.3*  HCT 25.4*  < > 28.0* 29.1* 33.9*  MCV 87.6  < > 88.3 89.0 87.1  PLT 124*  < > 136* 139* 178  < > = values in this interval not displayed. Cardiac Enzymes: No results found for this basename: CKTOTAL, CKMB, CKMBINDEX, TROPONINI,  in the last 8760 hours BNP: No components found with this basename: POCBNP,  CBG:  Recent Labs  04/07/12 0734 04/07/12 1152 07/02/12 2259  GLUCAP 115* 138* 183*    Assessment/Plan 1. Constipation Will have staff give pt MOM PO now and fleets enema now   2. Abdominal pain BS in all 4 quad; abdomen soft but distended; most likely due to constipation will get KUB at this time Will check bmp and CBC  3.  Vit D def Will follow up vit D level with blood draw

## 2012-12-21 ENCOUNTER — Non-Acute Institutional Stay (SKILLED_NURSING_FACILITY): Payer: Medicare Other | Admitting: Nurse Practitioner

## 2012-12-21 DIAGNOSIS — I6350 Cerebral infarction due to unspecified occlusion or stenosis of unspecified cerebral artery: Secondary | ICD-10-CM

## 2012-12-21 DIAGNOSIS — E559 Vitamin D deficiency, unspecified: Secondary | ICD-10-CM

## 2012-12-21 DIAGNOSIS — I959 Hypotension, unspecified: Secondary | ICD-10-CM

## 2012-12-21 DIAGNOSIS — E1149 Type 2 diabetes mellitus with other diabetic neurological complication: Secondary | ICD-10-CM

## 2012-12-21 DIAGNOSIS — K5900 Constipation, unspecified: Secondary | ICD-10-CM

## 2012-12-21 DIAGNOSIS — D509 Iron deficiency anemia, unspecified: Secondary | ICD-10-CM

## 2012-12-21 DIAGNOSIS — I639 Cerebral infarction, unspecified: Secondary | ICD-10-CM

## 2012-12-21 NOTE — Progress Notes (Signed)
Patient ID: Chelsey Gallagher, female   DOB: Nov 13, 1950, 62 y.o.   MRN: [id number]   PCP: Kimber Relic, MD   Allergies  Allergen Reactions  . Codeine Other (See Comments)    Per Mar  . Flexeril [Cyclobenzaprine Hcl] Other (See Comments)    Per Mar  . Lyrica [Pregabalin] Other (See Comments)    "wires my up"    Chief Complaint  Patient presents with  . Medical Managment of Chronic Issues    and follow up constipation    HPI:  62 year old female with a pmh of CVA with spastic hemiplegia, constipation, anxiety, DM, HTN, and hyperlipidemia is being seen today for routine follow up.  pt was seen yesterday due to constipation and abdominal pain. MOM and fleets enema was ordered; pt refused MOM but had enema which was successful; KUB done which was negative for acute process or constipation; pt reports pain has improved no fevers or chills; no shortness of breath, cough or chest pains; no N/V/D Reassessment of ongoing issues:   Anemia- CBC remains stable  Constipation- worse; pt was not taking miralax as scheduled ;on senakot S BID and miralax 17 gm daily Anxiety with depression- stable on cymbalta 60 mg daily  DM- improved on metformin 500 mg BID and lantus 8 units qhs; currently on SSI and getting novolog frequently overall blood sugars have improved  Hyperlipidemia- tolerating pravastatin without side effects Spastic hemiplegia-pt is on gabapentin and baclofen     Review of Systems:  Review of Systems  Constitutional: Negative for fever, chills, weight loss and malaise/fatigue.  Eyes: Negative for blurred vision and double vision.  Respiratory: Negative for cough and sputum production.   Cardiovascular: Negative for chest pain, palpitations and leg swelling.  Gastrointestinal: Positive for abdominal pain and constipation (constipated at times). Negative for heartburn and diarrhea.  Genitourinary: Negative for dysuria, urgency and frequency.  Musculoskeletal: Negative for joint  pain and myalgias.  Skin: Negative for itching and rash.  Neurological: Negative for weakness and headaches. Dizziness: at times - has orthostatic hypotension.          Psychiatric/Behavioral: Negative for depression. The patient is not nervous/anxious.      Past Medical History  Diagnosis Date  . Diabetes mellitus   . Hypertension   . Depression   . PAD (peripheral artery disease)     S/p bypass grafting, unclear exactly where  . Nephrolithiasis   . Hyperlipidemia 05/14/2011  . Hearing loss   . Complication of anesthesia     doesn't wake up good- "usually end up in ICU"  . PONV (postoperative nausea and vomiting)   . Orthostatic hypotension     after surgery.  Marland Kitchen Heart murmur     PC[facility]- [facility]- No tx needed.  . Stroke 05/2011    Weakness on Right. Wears leg brace.  . Foot drop, left   . H/O hiatal hernia     second one  . Neuromuscular disorder     Diabetic neuropathy- has inproved since she quit smoking.  . Arthritis    Past Surgical History  Procedure Laterality Date  . Total hip arthroplasty  2008    Right  . Abdominal hysterectomy  2003  . Cesarean section  1984  . Cholecystectomy  1984  . Bypass graft  2003    Abdominal aortic  . Kidney stone surgery  1999  . Appendectomy  2011  . Hernia repair  2011  . Lithotripsy    .  Abdominal hysterectomy     Social History:   reports that she has quit smoking. Her smoking use included Cigarettes. She smoked 0.00 packs per day for 45 years. She quit smokeless tobacco use about 19 months ago. She reports that she does not drink alcohol or use illicit drugs.  Family History  Problem Relation Age of Onset  . Cancer Maternal Aunt     breast    Medications: Patient's Medications  New Prescriptions   No medications on file  Previous Medications   ACETAMINOPHEN (TYLENOL) 325 MG TABLET    Take 650 mg by mouth every 6 (six) hours as needed for pain.   ASPIRIN EC 81 MG TABLET    Take 81 mg by mouth daily.    BACLOFEN (LIORESAL) 10 MG TABLET    Take 5 mg by mouth 2 (two) times daily. 5 mg at 8a and 2p and 20 mg at hs   BISACODYL (DULCOLAX) 10 MG SUPPOSITORY    Place 10 mg rectally as needed for constipation.   CLOPIDOGREL (PLAVIX) 75 MG TABLET    Take 75 mg by mouth daily.   DULOXETINE (CYMBALTA) 30 MG CAPSULE    Take 60 mg by mouth daily.   GABAPENTIN (NEURONTIN) 300 MG CAPSULE    Take 300 mg by mouth at bedtime.   INSULIN GLARGINE (LANTUS) 100 UNIT/ML INJECTION    Inject 8 Units into the skin at bedtime.    LISINOPRIL (PRINIVIL,ZESTRIL) 20 MG TABLET    Take 10 mg by mouth 2 (two) times daily.    MAGNESIUM HYDROXIDE (MILK OF MAGNESIA) 800 MG/5ML SUSPENSION    Take by mouth daily as needed for constipation. 30cc   METFORMIN (GLUCOPHAGE) 500 MG TABLET    Take 500 mg by mouth 2 (two) times daily with a meal.   MIDODRINE (PROAMATINE) 5 MG TABLET    Take 5 mg by mouth 3 (three) times daily.   POLYETHYLENE GLYCOL (MIRALAX / GLYCOLAX) PACKET    Take 17 g by mouth daily.   PRAVASTATIN (PRAVACHOL) 40 MG TABLET    Take 40 mg by mouth daily.    SENNOSIDES-DOCUSATE SODIUM (SENOKOT-S) 8.6-50 MG TABLET    Take 1 tablet by mouth 2 (two) times daily.   TRAMADOL (ULTRAM) 50 MG TABLET    Take 2 tablets (100 mg total) by mouth every 8 (eight) hours as needed for pain.  Modified Medications   No medications on file  Discontinued Medications   No medications on file     Physical Exam:  Filed Vitals:   12/21/12 1436  BP: 111/72  Pulse: 96  Temp: 98.1 F (36.7 C)  Resp: 18  Weight: 149 lb (67.586 kg)    Physical Exam  Vitals reviewed. Constitutional: She is well-developed, well-nourished, and in no distress. No distress.  HENT:  Head: Normocephalic and atraumatic.  Right Ear: External ear normal.  Left Ear: External ear normal.  Nose: Nose normal.  Mouth/Throat: Oropharynx is clear and moist.  Eyes: Conjunctivae and EOM are normal. Pupils are equal, round, and reactive to light.  Neck: Normal range  of motion. Neck supple.  Cardiovascular: Normal rate and regular rhythm.   Murmur heard. Pulmonary/Chest: Effort normal and breath sounds normal. No respiratory distress. She has no wheezes.  Abdominal: Soft. Bowel sounds are normal. She exhibits distension. There is no tenderness. There is no rebound and no guarding.  Musculoskeletal: She exhibits no edema and no tenderness.  Neurological: She is alert.  Skin: Skin is warm and dry.  She is not diaphoretic.  Psychiatric: Affect normal.     Labs reviewed: Basic Metabolic Panel:  Recent Labs  16/10/96 0345 04/06/12 0430 04/07/12 0435  NA 133* 135 135  K 4.3 4.3 3.7  CL 101 104 100  CO2 23 25 26   GLUCOSE 87 92 120*  BUN 48* 28* 18  CREATININE 0.93 0.84 0.62  CALCIUM 8.9 9.0 9.8   Liver Function Tests:  Recent Labs  04/05/12 0345 04/06/12 0430 04/07/12 0435  AST 21 24 22   ALT 16 18 19   ALKPHOS 60 62 79  BILITOT 0.1* 0.2* 0.3  PROT 5.9* 6.3 7.4  ALBUMIN 3.0* 3.1* 3.7   No results found for this basename: LIPASE, AMYLASE,  in the last 8760 hours No results found for this basename: AMMONIA,  in the last 8760 hours CBC:  Recent Labs  04/04/12 2144  04/05/12 1016 04/06/12 0430 04/07/12 0435  WBC 6.3  < > 6.6 5.7 7.1  NEUTROABS 4.0  --   --   --   --   HGB 8.4*  < > 9.1* 9.6* 11.3*  HCT 25.4*  < > 28.0* 29.1* 33.9*  MCV 87.6  < > 88.3 89.0 87.1  PLT 124*  < > 136* 139* 178  < > = values in this interval not displayed. Cardiac Enzymes: No results found for this basename: CKTOTAL, CKMB, CKMBINDEX, TROPONINI,  in the last 8760 hours BNP: No components found with this basename: POCBNP,  CBG:  Recent Labs  04/07/12 0734 04/07/12 1152 07/02/12 2259  GLUCAP 115* 138* 183*    CBC with Diff   Result: 09/09/2012 11:28 AM ( Status: F )   WBC 5.6 4.0-10.5 K/uL SLN   RBC 3.53 L 3.87-5.11 MIL/uL SLN   Hemoglobin 10.3 L 12.0-15.0 g/dL SLN   Hematocrit 04.5 L 36.0-46.0 % SLN   MCV 86.7 78.0-100.0 fL SLN   MCH 29.2  26.0-34.0 pg SLN   MCHC 33.7 30.0-36.0 g/dL SLN   RDW 40.9 81.1-91.4 % SLN   Platelet Count 146 L 150-400 K/uL SLN   Granulocyte % 56 43-77 % SLN   Absolute Gran 3.1 1.7-7.7 K/uL SLN   Lymph % 30 12-46 % SLN   Absolute Lymph 1.7 0.7-4.0 K/uL SLN   Mono % 12 3-12 % SLN   Absolute Mono 0.7 0.1-1.0 K/uL SLN   Eos % 2 0-5 % SLN   Absolute Eos 0.1 0.0-0.7 K/uL SLN   Baso % 0 0-1 % SLN   Absolute Baso 0.0 0.0-0.1 K/uL SLN   Smear Review Criteria for review not met SLN   Basic Metabolic Panel   Result: 09/09/2012 12:18 PM ( Status: F )   Sodium 136 135-145 mEq/L SLN   Potassium 3.9 3.5-5.3 mEq/L SLN   Chloride 104 96-112 mEq/L SLN   CO2 27 19-32 mEq/L SLN   Glucose 97 70-99 mg/dL SLN   BUN 21 7-82 mg/dL SLN   Creatinine 9.56 2.13-0.86 mg/dL SLN   Calcium 9.3 5.7-84.6 mg/dL SLN     Comprehensive Metabolic Panel           Result: 11/03/2012 12:23 PM   ( Status: F )                    Sodium            137  135-145           mEq/L SLN       Potassium       4.4                               3.5-5.3 mEq/L SLN        Chloride           99                                96-112 mEq/L SLN        CO2    27                                19-32   mEq/L SLN        Glucose           129                  H          70-99   mg/dL  SLN        BUN     20                                6-23     mg/dL  SLN        Creatinine        0.73                             0.50-1.10         mg/dL  SLN       Bilirubin, Total 0.4                               0.3-1.2 mg/dL  SLN        Alkaline Phosphatase 68                                39-117 U/L       SLN       AST/SGOT      16                                0-37     U/L       SLN        ALT/SGPT       20                                0-35     U/L       SLN        Total Protein    6.8                               6.0-8.3 g/dL     SLN        Albumin           3.7  3.5-5.2 g/dL     SLN        Calcium            9.6                               8.4-10.5           mg/dL  SLN       TSH, Ultrasensitive        Result: 11/03/2012 1:03 PM     ( Status: F )                    TSH     1.277                           0.350-4.500     uIU/mL SLN        Vitamin D (25-Hydroxy)              Result: 11/03/2012 1:50 PM     ( Status: F )                    Vitamin D (25-Hydroxy)           17                    L          30-89   ng/mL  SLN     C  Test Description         Results                        Abnormal         Reference Range       Units            Lab      Note CBC NO Diff (Complete Blood Count)              Result: 10/28/2012 2:26 PM   ( Status: F )                    WBC   5.5                               4.0-10.5           K/uL     SLN        RBC    3.80                 L          3.87-5.11         MIL/uL  SLN        Hemoglobin     11.4                 L          12.0-15.0         g/dL     SLN       Hematocrit      34.1                 L          36.0-46.0         %         SLN  MCV    89.7                             78.0-100.0       fL         SLN        MCH    30.0                             26.0-34.0         pg        SLN        MCHC 33.4                             30.0-36.0         g/dL     SLN        RDW   45.4                             11.5-15.5         %         SLN        Platelet Count 153                              150-400           K/uL     SLN       Comprehensive Metabolic Panel           Result: 10/28/2012 4:06 PM   ( Status: F )                    Sodium            138                              135-145           mEq/L SLN       Potassium       4.0                               3.5-5.3 mEq/L SLN        Chloride           105                              96-112 mEq/L SLN        CO2    25                                19-32   mEq/L SLN        Glucose           163                  H          70-99   mg/dL  SLN        BUN     25  H          6-23     mg/dL   SLN        Creatinine        0.84                             0.50-1.10         mg/dL  SLN       Bilirubin, Total 0.3                               0.3-1.2 mg/dL  SLN        Alkaline Phosphatase 63                                39-117 U/L       SLN       AST/SGOT      24                                0-37     U/L       SLN        ALT/SGPT       25                                0-35     U/L       SLN        Total Protein    6.6                               6.0-8.3 g/dL     SLN        Albumin           3.7                               3.5-5.2 g/dL     SLN        Calcium           9.3                               8.4-10.5           mg/dL  SLN       TSH, Ultrasensitive        Result: 10/28/2012 4:04 PM   ( Status: F )                    TSH     1.086                           0.350-4.500     uIU/mL SLN        Vitamin D (25-Hydroxy)              Result: 10/29/2012 1:32 AM    ( Status: F )                    Vitamin D (25-Hydroxy)  18  CBC with 3 part Diff       Result: 12/21/2012 12:02 PM    ( Status: F )            WBC  7.3        4.0-10.5  K/uL  WML       RBC  3.53     L  3.87-5.11  MIL/uL  WML       Hemoglobin  9.9     L  12.0-15.0  g/dL  WML       Hematocrit  32.3     L  36.0-46.0  %  WML       MCV  91.5        78.0-100.0  fL  WML       MCH  28.0        26.0-34.0  pg  WML       MCHC  30.5        30.0-36.0  g/dL  WML       RDW  16.1        11.5-15.5  %  WML       Platelet Count  165        150-400  K/uL  WML       Granulocyte %  63        43-77  %  WML       Absolute Gran  4.6        1.7-7.7  K/uL  WML       Lymph %  31        12-46  %  WML       Absolute Lymph  2.3        0.7-4.0  K/uL  WML       Mono %  6        3-12  %  WML       Absolute Mono  0.4        0.1-1.0  K/uL  WML       Smear Review  Criteria for review not met   WML      Basic Metabolic Panel       Result: 12/21/2012 12:03 PM    ( Status: F )            Sodium  138        135-145  mEq/L  WML        Potassium  4.1        3.5-5.3  mEq/L  WML       Chloride  106        96-112  mEq/L  WML       CO2  26        19-32  mEq/L  WML       Glucose  137     H  70-99  mg/dL  WML       BUN  25     H  6-23  mg/dL  WML       Creatinine  0.80        0.50-1.10  mg/dL  WML       Calcium  9.8        Assessment/Plan 1. Hypotension Patient is stable; continue current regimen. Will monitor and make changes as necessary.  2. Unspecified constipation Will add benefiber daily; pt encouraged to increase fluid intake; abdominal discomfort has improved to notify if symptoms worsen  3. Type II  or unspecified type diabetes mellitus with neurological manifestations, uncontrolled(250.62) Will increase metformin to 1000mg  BID; will cont cbgs ACHS but stop SSI at this time  4. CVA (cerebral infarction) Stable at this time; no worsening of symptoms  5. Anemia, iron deficiency Patients hgb stable at this time; will cont to monitor   6. Vit D def Ordered Vit D level awaiting results, pt should be on vit D 1000 units daily at this time

## 2013-01-03 DIAGNOSIS — M6281 Muscle weakness (generalized): Secondary | ICD-10-CM | POA: Diagnosis not present

## 2013-01-03 DIAGNOSIS — I69998 Other sequelae following unspecified cerebrovascular disease: Secondary | ICD-10-CM | POA: Diagnosis not present

## 2013-01-03 DIAGNOSIS — M25579 Pain in unspecified ankle and joints of unspecified foot: Secondary | ICD-10-CM | POA: Diagnosis not present

## 2013-01-05 DIAGNOSIS — M25579 Pain in unspecified ankle and joints of unspecified foot: Secondary | ICD-10-CM | POA: Diagnosis not present

## 2013-01-05 DIAGNOSIS — I69998 Other sequelae following unspecified cerebrovascular disease: Secondary | ICD-10-CM | POA: Diagnosis not present

## 2013-01-05 DIAGNOSIS — M6281 Muscle weakness (generalized): Secondary | ICD-10-CM | POA: Diagnosis not present

## 2013-01-09 ENCOUNTER — Encounter: Payer: Self-pay | Admitting: Internal Medicine

## 2013-01-09 ENCOUNTER — Non-Acute Institutional Stay (SKILLED_NURSING_FACILITY): Payer: Medicare Other | Admitting: Internal Medicine

## 2013-01-09 DIAGNOSIS — T148XXA Other injury of unspecified body region, initial encounter: Secondary | ICD-10-CM

## 2013-01-09 NOTE — Progress Notes (Signed)
MRN: [id number] Name: Chelsey Gallagher  Sex: female Age: 62 y.o. DOB: 1950-12-23  PSC #: [facility] Facility/Room: 126A Level Of Care: SNF Provider: Merrilee Seashore D Emergency Contacts: Extended Emergency Contact Information Primary Emergency Contact: Gerre Pebbles of [place] Home Phone: [phone] Mobile Phone: [phone] Relation: Daughter  Code Status: FULL  Allergies: Codeine; Flexeril; and Lyrica  Chief Complaint  Patient presents with  . Acute Visit    HPI: Patient is 62 y.o. female who the nurses said had a 12 cm circumference black and yellow bruise on abdomen.  Past Medical History  Diagnosis Date  . Diabetes mellitus   . Hypertension   . Depression   . PAD (peripheral artery disease)     S/p bypass grafting, unclear exactly where  . Nephrolithiasis   . Hyperlipidemia 05/14/2011  . Hearing loss   . Complication of anesthesia     doesn't wake up good- "usually end up in ICU"  . PONV (postoperative nausea and vomiting)   . Orthostatic hypotension     after surgery.  Marland Kitchen Heart murmur     PCP- [facility]- No tx needed.  . Stroke 05/2011    Weakness on Right. Wears leg brace.  . Foot drop, left   . H/O hiatal hernia     second one  . Neuromuscular disorder     Diabetic neuropathy- has inproved since she quit smoking.  . Arthritis     Past Surgical History  Procedure Laterality Date  . Total hip arthroplasty  2008    Right  . Abdominal hysterectomy  2003  . Cesarean section  1984  . Cholecystectomy  1984  . Bypass graft  2003    Abdominal aortic  . Kidney stone surgery  1999  . Appendectomy  2011  . Hernia repair  2011  . Lithotripsy    . Abdominal hysterectomy        Medication List       This list is accurate as of: 01/09/13  4:13 PM.  Always use your most recent med list.               acetaminophen 325 MG tablet  Commonly known as:  TYLENOL  Take 650 mg by mouth every 6 (six) hours as needed for pain.      aspirin EC 81 MG tablet  Take 81 mg by mouth daily.     baclofen 10 MG tablet  Commonly known as:  LIORESAL  Take 5 mg by mouth 2 (two) times daily. 5 mg at 8a and 2p and 20 mg at hs     bisacodyl 10 MG suppository  Commonly known as:  DULCOLAX  Place 10 mg rectally as needed for constipation.     clopidogrel 75 MG tablet  Commonly known as:  PLAVIX  Take 75 mg by mouth daily.     DULoxetine 30 MG capsule  Commonly known as:  CYMBALTA  Take 60 mg by mouth daily.     gabapentin 300 MG capsule  Commonly known as:  NEURONTIN  Take 300 mg by mouth at bedtime.     insulin glargine 100 UNIT/ML injection  Commonly known as:  LANTUS  Inject 8 Units into the skin at bedtime.     lisinopril 20 MG tablet  Commonly known as:  PRINIVIL,ZESTRIL  Take 10 mg by mouth 2 (two) times daily.     magnesium hydroxide 800 MG/5ML suspension  Commonly known as:  MILK OF MAGNESIA  Take by  mouth daily as needed for constipation. 30cc     metFORMIN 500 MG tablet  Commonly known as:  GLUCOPHAGE  Take 500 mg by mouth 2 (two) times daily with a meal.     midodrine 5 MG tablet  Commonly known as:  PROAMATINE  Take 5 mg by mouth 3 (three) times daily.     polyethylene glycol packet  Commonly known as:  MIRALAX / GLYCOLAX  Take 17 g by mouth daily.     pravastatin 40 MG tablet  Commonly known as:  PRAVACHOL  Take 40 mg by mouth daily.     sennosides-docusate sodium 8.6-50 MG tablet  Commonly known as:  SENOKOT-S  Take 1 tablet by mouth 2 (two) times daily.     traMADol 50 MG tablet  Commonly known as:  ULTRAM  Take 2 tablets (100 mg total) by mouth every 8 (eight) hours as needed for pain.        No orders of the defined types were placed in this encounter.    Immunization History  Administered Date(s) Administered  . Pneumococcal Polysaccharide 09/03/2011    History  Substance Use Topics  . Smoking status: Former Smoker -- 0.00 packs/day for 45 years    Types: Cigarettes   . Smokeless tobacco: Former Neurosurgeon    Quit date: 05/08/2011  . Alcohol Use: No    Review of Systems  DATA OBTAINED: from patient, nurse GENERAL: Feels well no fevers, fatigue, appetite changes SKIN: No itching, rash; R abd hurt a little for several days but not now. HEENT: No complaint RESPIRATORY: No cough, wheezing, SOB CARDIAC: No chest pain, palpitations, lower extremity edema  GI: No abdominal pain, No N/V/D or constipation, No heartburn or reflux  GU: No dysuria, frequency or urgency, or incontinence  MUSCULOSKELETAL: No unrelieved bone/joint pain NEUROLOGIC: No headache, dizziness PSYCHIATRIC: No overt anxiety or sadness. Sleeps well.   Filed Vitals:   01/09/13 1535  BP: 124/68  Pulse: 73  Temp: 97.9 F (36.6 C)  Resp: 18    Physical Exam  GENERAL APPEARANCE: Alert, conversant. Appropriately groomed. No acute distress  SKIN: No diaphoresis rash; 3 cm circular bluish discoloration R abdomen HEENT: Unremarkable RESPIRATORY: Breathing is even, unlabored. Lung sounds are clear   CARDIOVASCULAR: Heart RRR no murmurs, rubs or gallops. No peripheral edema  GASTROINTESTINAL: Abdomen is soft, non-tender, not distended w/ normal bowel sounds.  GENITOURINARY: Bladder non tender, not distended  MUSCULOSKELETAL: L foot drop NEUROLOGIC: Cranial nerves 2-12 grossly intact; R hemiparesis PSYCHIATRIC: Mood and affect appropriate to situation, no behavioral issues  Patient Active Problem List   Diagnosis Date Noted  . UTI (urinary tract infection) 07/02/2012  . Anemia, iron deficiency 07/02/2012  . Ingrown toenail 07/02/2012  . Unspecified constipation 07/02/2012  . 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 Unspecified cerebral artery occlusion with cerebral infarction 04/13/2012  . Orthostatic hypotension 04/06/2012  . Abnormal TSH 04/06/2012  . Spastic hemiplegia affecting dominant side 07/25/2011  . Stroke 05/15/2011  .  Hyperlipidemia 05/14/2011  . Tobacco abuse 05/14/2011  . 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 Lung abnormality 05/11/2011  . CVA (cerebral infarction) 05/08/2011  . Hypertension   . Type II or unspecified type diabetes mellitus with neurological manifestations, uncontrolled(250.62)     Assessment and Plan  CONTUSION SKIN R ABD WALL- resolving  Margit Hanks, MD

## 2013-01-10 DIAGNOSIS — I69998 Other sequelae following unspecified cerebrovascular disease: Secondary | ICD-10-CM | POA: Diagnosis not present

## 2013-01-10 DIAGNOSIS — M6281 Muscle weakness (generalized): Secondary | ICD-10-CM | POA: Diagnosis not present

## 2013-01-10 DIAGNOSIS — M25579 Pain in unspecified ankle and joints of unspecified foot: Secondary | ICD-10-CM | POA: Diagnosis not present

## 2013-01-11 ENCOUNTER — Non-Acute Institutional Stay (SKILLED_NURSING_FACILITY): Payer: Medicare Other | Admitting: Nurse Practitioner

## 2013-01-11 ENCOUNTER — Encounter: Payer: Self-pay | Admitting: Nurse Practitioner

## 2013-01-11 DIAGNOSIS — E1149 Type 2 diabetes mellitus with other diabetic neurological complication: Secondary | ICD-10-CM

## 2013-01-11 DIAGNOSIS — M25579 Pain in unspecified ankle and joints of unspecified foot: Secondary | ICD-10-CM

## 2013-01-11 DIAGNOSIS — M19079 Primary osteoarthritis, unspecified ankle and foot: Secondary | ICD-10-CM | POA: Diagnosis not present

## 2013-01-11 DIAGNOSIS — K5900 Constipation, unspecified: Secondary | ICD-10-CM | POA: Diagnosis not present

## 2013-01-11 DIAGNOSIS — M25571 Pain in right ankle and joints of right foot: Secondary | ICD-10-CM

## 2013-01-11 DIAGNOSIS — I69998 Other sequelae following unspecified cerebrovascular disease: Secondary | ICD-10-CM | POA: Diagnosis not present

## 2013-01-11 DIAGNOSIS — G8110 Spastic hemiplegia affecting unspecified side: Secondary | ICD-10-CM | POA: Diagnosis not present

## 2013-01-11 DIAGNOSIS — M6530 Trigger finger, unspecified finger: Secondary | ICD-10-CM

## 2013-01-11 DIAGNOSIS — I10 Essential (primary) hypertension: Secondary | ICD-10-CM

## 2013-01-11 DIAGNOSIS — M6281 Muscle weakness (generalized): Secondary | ICD-10-CM | POA: Diagnosis not present

## 2013-01-11 NOTE — Progress Notes (Signed)
Patient ID: Chelsey LOISELLE, female   DOB: 18-Feb-1951, 62 y.o.   MRN: [id number] Nursing Home Location:  [facility]   Place of Service: SNF (31)  PCP: Kimber Relic, MD   Allergies  Allergen Reactions  . Codeine Other (See Comments)    Per Mar  . Flexeril [Cyclobenzaprine Hcl] Other (See Comments)    Per Mar  . Lyrica [Pregabalin] Other (See Comments)    "wires my up"    Chief Complaint  Patient presents with  . Medical Managment of Chronic Issues    HPI:  62 year old female with a pmh of CVA with spastic hemiplegia, constipation, anxiety, DM, HTN, and hyperlipidemia is being seen today for routine follow up. Pt reports she is having worsening right ankle pain; has been walking with therapy and the pain gets so bad she has to stop; reports this is a throbbing pain; pt thought it was related to her brace but has gotten a new brace and the pain is unchanged; pt started noticing the pain several months ago after a fall and is has not improved.  Pt also with worsening trigger finger on her left middle finger; more frequently getting stuck and having to manuell fix it and would like to see ortho for this.  Reassessment of ongoing issues:  Anemia- CBC is slightly worse; pt refusing iron  Constipation- same; pt now taking miralax as scheduled ;on senakot S BID and miralax 17 gm daily  Anxiety with depression- stable on cymbalta 60 mg daily  DM- unchanged; taking metformin 1000 mg BID and lantus 8 units qhs; still with elevation in fasting blood sugars over 150s Hyperlipidemia- tolerating pravastatin without side effects  Spastic hemiplegia-pt is on gabapentin and baclofen   Review of Systems:  Review of Systems  Constitutional: Negative for fever, chills, weight loss and malaise/fatigue.  Eyes: Negative for blurred vision and double vision.  Respiratory: Negative for cough and sputum production.   Cardiovascular: Negative for chest pain, palpitations and leg swelling.   Gastrointestinal: Positive for constipation (constipated at times). Negative for heartburn, abdominal pain and diarrhea.  Genitourinary: Negative for dysuria, urgency and frequency.  Musculoskeletal: Positive for joint pain (ankle). Negative for myalgias.  Skin: Negative for itching and rash.  Neurological: Negative for weakness and headaches. Dizziness: at times - has orthostatic hypotension.  Psychiatric/Behavioral: Negative for depression. The patient is not nervous/anxious.      Past Medical History  Diagnosis Date  . Diabetes mellitus   . Hypertension   . Depression   . PAD (peripheral artery disease)     S/p bypass grafting, unclear exactly where  . Nephrolithiasis   . Hyperlipidemia 05/14/2011  . Hearing loss   . Complication of anesthesia     doesn't wake up good- "usually end up in ICU"  . PONV (postoperative nausea and vomiting)   . Orthostatic hypotension     after surgery.  Marland Kitchen Heart murmur     PCP- [facility]- No tx needed.  . Stroke 05/2011    Weakness on Right. Wears leg brace.  . Foot drop, left   . H/O hiatal hernia     second one  . Neuromuscular disorder     Diabetic neuropathy- has inproved since she quit smoking.  . Arthritis    Past Surgical History  Procedure Laterality Date  . Total hip arthroplasty  2008    Right  . Abdominal hysterectomy  2003  . Cesarean section  1984  .  Cholecystectomy  1984  . Bypass graft  2003    Abdominal aortic  . Kidney stone surgery  1999  . Appendectomy  2011  . Hernia repair  2011  . Lithotripsy    . Abdominal hysterectomy     Social History:   reports that she has quit smoking. Her smoking use included Cigarettes. She smoked 0.00 packs per day for 45 years. She quit smokeless tobacco use about 20 months ago. She reports that she does not drink alcohol or use illicit drugs.  Family History  Problem Relation Age of Onset  . Cancer Maternal Aunt     breast    Medications: Patient's Medications   New Prescriptions   No medications on file  Previous Medications   ACETAMINOPHEN (TYLENOL) 325 MG TABLET    Take 650 mg by mouth every 6 (six) hours as needed for pain.   ASPIRIN EC 81 MG TABLET    Take 81 mg by mouth daily.   BACLOFEN (LIORESAL) 10 MG TABLET    Take 5 mg by mouth 2 (two) times daily. 5 mg at 8a and 2p and 20 mg at hs   BISACODYL (DULCOLAX) 10 MG SUPPOSITORY    Place 10 mg rectally as needed for constipation.   CLOPIDOGREL (PLAVIX) 75 MG TABLET    Take 75 mg by mouth daily.   DULOXETINE (CYMBALTA) 30 MG CAPSULE    Take 60 mg by mouth daily.   GABAPENTIN (NEURONTIN) 300 MG CAPSULE    Take 300 mg by mouth at bedtime.   INSULIN GLARGINE (LANTUS) 100 UNIT/ML INJECTION    Inject 8 Units into the skin at bedtime.    LISINOPRIL (PRINIVIL,ZESTRIL) 20 MG TABLET    Take 10 mg by mouth 2 (two) times daily.    MAGNESIUM HYDROXIDE (MILK OF MAGNESIA) 800 MG/5ML SUSPENSION    Take by mouth daily as needed for constipation. 30cc   METFORMIN (GLUCOPHAGE) 500 MG TABLET    Take 1,000 mg by mouth 2 (two) times daily with a meal.    MIDODRINE (PROAMATINE) 5 MG TABLET    Take 5 mg by mouth 3 (three) times daily.   POLYETHYLENE GLYCOL (MIRALAX / GLYCOLAX) PACKET    Take 17 g by mouth daily.   PRAVASTATIN (PRAVACHOL) 40 MG TABLET    Take 40 mg by mouth daily.    SENNOSIDES-DOCUSATE SODIUM (SENOKOT-S) 8.6-50 MG TABLET    Take 1 tablet by mouth 2 (two) times daily.   TRAMADOL (ULTRAM) 50 MG TABLET    Take 2 tablets (100 mg total) by mouth every 8 (eight) hours as needed for pain.  Modified Medications   No medications on file  Discontinued Medications   No medications on file     Physical Exam: Physical Exam  Vitals reviewed. Constitutional: She is well-developed, well-nourished, and in no distress. No distress.  HENT:  Head: Normocephalic and atraumatic.  Right Ear: External ear normal.  Left Ear: External ear normal.  Nose: Nose normal.  Mouth/Throat: Oropharynx is clear and moist.   Eyes: Conjunctivae and EOM are normal. Pupils are equal, round, and reactive to light.  Neck: Normal range of motion. Neck supple.  Cardiovascular: Normal rate and regular rhythm.   Murmur heard. Pulmonary/Chest: Effort normal and breath sounds normal. No respiratory distress. She has no wheezes.  Abdominal: Soft. Bowel sounds are normal. She exhibits no distension. There is no tenderness. There is no rebound and no guarding.  Musculoskeletal: She exhibits no edema and no tenderness.  Neurological:  She is alert.  Right sided hemiparesis   Skin: Skin is warm and dry. She is not diaphoretic.  Psychiatric: Affect normal.     Filed Vitals:   01/11/13 1309  BP: 120/69  Pulse: 82  Temp: 98.4 F (36.9 C)  Resp: 20      Labs reviewed: Basic Metabolic Panel:  Recent Labs  96/04/54 0345 04/06/12 0430 04/07/12 0435  NA 133* 135 135  K 4.3 4.3 3.7  CL 101 104 100  CO2 23 25 26   GLUCOSE 87 92 120*  BUN 48* 28* 18  CREATININE 0.93 0.84 0.62  CALCIUM 8.9 9.0 9.8   Liver Function Tests:  Recent Labs  04/05/12 0345 04/06/12 0430 04/07/12 0435  AST 21 24 22   ALT 16 18 19   ALKPHOS 60 62 79  BILITOT 0.1* 0.2* 0.3  PROT 5.9* 6.3 7.4  ALBUMIN 3.0* 3.1* 3.7   No results found for this basename: LIPASE, AMYLASE,  in the last 8760 hours No results found for this basename: AMMONIA,  in the last 8760 hours CBC:  Recent Labs  04/04/12 2144  04/05/12 1016 04/06/12 0430 04/07/12 0435  WBC 6.3  < > 6.6 5.7 7.1  NEUTROABS 4.0  --   --   --   --   HGB 8.4*  < > 9.1* 9.6* 11.3*  HCT 25.4*  < > 28.0* 29.1* 33.9*  MCV 87.6  < > 88.3 89.0 87.1  PLT 124*  < > 136* 139* 178  < > = values in this interval not displayed. Cardiac Enzymes: No results found for this basename: CKTOTAL, CKMB, CKMBINDEX, TROPONINI,  in the last 8760 hours BNP: No components found with this basename: POCBNP,  CBG:  Recent Labs  04/07/12 0734 04/07/12 1152 07/02/12 2259  GLUCAP 115* 138* 183*    TSH:  Recent Labs  04/05/12 0345  TSH 0.060*   A1C: Lab Results  Component Value Date   HGBA1C 5.3 04/05/2012   CBC with Diff  Result: 09/09/2012 11:28 AM ( Status: F )  WBC 5.6 4.0-10.5 K/uL SLN  RBC 3.53 L 3.87-5.11 MIL/uL SLN  Hemoglobin 10.3 L 12.0-15.0 g/dL SLN  Hematocrit 09.8 L 36.0-46.0 % SLN  MCV 86.7 78.0-100.0 fL SLN  MCH 29.2 26.0-34.0 pg SLN  MCHC 33.7 30.0-36.0 g/dL SLN  RDW 11.9 14.7-82.9 % SLN  Platelet Count 146 L 150-400 K/uL SLN  Granulocyte % 56 43-77 % SLN  Absolute Gran 3.1 1.7-7.7 K/uL SLN  Lymph % 30 12-46 % SLN  Absolute Lymph 1.7 0.7-4.0 K/uL SLN  Mono % 12 3-12 % SLN  Absolute Mono 0.7 0.1-1.0 K/uL SLN  Eos % 2 0-5 % SLN  Absolute Eos 0.1 0.0-0.7 K/uL SLN  Baso % 0 0-1 % SLN  Absolute Baso 0.0 0.0-0.1 K/uL SLN  Smear Review Criteria for review not met SLN  Basic Metabolic Panel  Result: 09/09/2012 12:18 PM ( Status: F )  Sodium 136 135-145 mEq/L SLN  Potassium 3.9 3.5-5.3 mEq/L SLN  Chloride 104 96-112 mEq/L SLN  CO2 27 19-32 mEq/L SLN  Glucose 97 70-99 mg/dL SLN  BUN 21 5-62 mg/dL SLN  Creatinine 1.30 8.65-7.84 mg/dL SLN  Calcium 9.3 6.9-62.9 mg/dL SLN    Comprehensive Metabolic Panel  Result: 11/03/2012 12:23 PM ( Status: F )  Sodium 137 135-145 mEq/L SLN  Potassium 4.4 3.5-5.3 mEq/L SLN  Chloride 99 96-112 mEq/L SLN  CO2 27 19-32 mEq/L SLN  Glucose 129 H 70-99 mg/dL SLN  BUN 20 5-28 mg/dL  SLN  Creatinine 0.73 0.50-1.10 mg/dL SLN  Bilirubin, Total 0.4 0.3-1.2 mg/dL SLN  Alkaline Phosphatase 68 39-117 U/L SLN  AST/SGOT 16 0-37 U/L SLN  ALT/SGPT 20 0-35 U/L SLN  Total Protein 6.8 6.0-8.3 g/dL SLN  Albumin 3.7 4.0-1.0 g/dL SLN  Calcium 9.6 2.7-25.3 mg/dL SLN  TSH, Ultrasensitive  Result: 11/03/2012 1:03 PM ( Status: F )  TSH 1.277 0.350-4.500 uIU/mL SLN  Vitamin D (25-Hydroxy)  Result: 11/03/2012 1:50 PM ( Status: F )  Vitamin D (25-Hydroxy) 17 L 30-89 ng/mL SLN C  Test Description Results Abnormal Reference Range Units Lab Note   CBC NO Diff (Complete Blood Count)  Result: 10/28/2012 2:26 PM ( Status: F )  WBC 5.5 4.0-10.5 K/uL SLN  RBC 3.80 L 3.87-5.11 MIL/uL SLN  Hemoglobin 11.4 L 12.0-15.0 g/dL SLN  Hematocrit 66.4 L 36.0-46.0 % SLN  MCV 89.7 78.0-100.0 fL SLN  MCH 30.0 26.0-34.0 pg SLN  MCHC 33.4 30.0-36.0 g/dL SLN  RDW 40.3 47.4-25.9 % SLN  Platelet Count 153 150-400 K/uL SLN  Comprehensive Metabolic Panel  Result: 10/28/2012 4:06 PM ( Status: F )  Sodium 138 135-145 mEq/L SLN  Potassium 4.0 3.5-5.3 mEq/L SLN  Chloride 105 96-112 mEq/L SLN  CO2 25 19-32 mEq/L SLN  Glucose 163 H 70-99 mg/dL SLN  BUN 25 H 5-63 mg/dL SLN  Creatinine 8.75 6.43-3.29 mg/dL SLN  Bilirubin, Total 0.3 0.3-1.2 mg/dL SLN  Alkaline Phosphatase 63 39-117 U/L SLN  AST/SGOT 24 0-37 U/L SLN  ALT/SGPT 25 0-35 U/L SLN  Total Protein 6.6 6.0-8.3 g/dL SLN  Albumin 3.7 5.1-8.8 g/dL SLN  Calcium 9.3 4.1-66.0 mg/dL SLN  TSH, Ultrasensitive  Result: 10/28/2012 4:04 PM ( Status: F )  TSH 1.086 0.350-4.500 uIU/mL SLN  Vitamin D (25-Hydroxy)  Result: 10/29/2012 1:32 AM ( Status: F )  Vitamin D (25-Hydroxy) 18  CBC with 3 part Diff  Result: 12/21/2012 12:02 PM ( Status: F )  WBC 7.3 4.0-10.5 K/uL WML  RBC 3.53 L 3.87-5.11 MIL/uL WML  Hemoglobin 9.9 L 12.0-15.0 g/dL WML  Hematocrit 63.0 L 36.0-46.0 % WML  MCV 91.5 78.0-100.0 fL WML  MCH 28.0 26.0-34.0 pg WML  MCHC 30.5 30.0-36.0 g/dL WML  RDW 16.0 10.9-32.3 % WML  Platelet Count 165 150-400 K/uL WML  Granulocyte % 63 43-77 % WML  Absolute Gran 4.6 1.7-7.7 K/uL WML  Lymph % 31 12-46 % WML  Absolute Lymph 2.3 0.7-4.0 K/uL WML  Mono % 6 3-12 % WML  Absolute Mono 0.4 0.1-1.0 K/uL WML  Smear Review Criteria for review not met WML  Basic Metabolic Panel  Result: 12/21/2012 12:03 PM ( Status: F )  Sodium 138 135-145 mEq/L WML  Potassium 4.1 3.5-5.3 mEq/L WML  Chloride 106 96-112 mEq/L WML  CO2 26 19-32 mEq/L WML  Glucose 137 H 70-99 mg/dL WML  BUN 25 H 5-57 mg/dL WML  Creatinine  3.22 0.25-4.27 mg/dL WML  Calcium 9.8    Assessment/Plan 1. Hypertension Patient is stable; continue current regimen. Will monitor and make changes as necessary.  2. Unspecified constipation Pt reports she has been more regular; will not make changes at this time   3. 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 Spastic hemiplegia affecting dominant side Stable; conts to work with OT and PT  5. Trigger finger (acquired) Would like to see ortho due to trigger finger; worsening of symptoms;  getting "stuck" freuqently  6. Pain in joint, ankle and foot, right Worse after fall; had different orthotic for foot; this has not helped; will get xray and PT to eval and treat at this time.

## 2013-01-12 DIAGNOSIS — M25579 Pain in unspecified ankle and joints of unspecified foot: Secondary | ICD-10-CM | POA: Diagnosis not present

## 2013-01-12 DIAGNOSIS — M6281 Muscle weakness (generalized): Secondary | ICD-10-CM | POA: Diagnosis not present

## 2013-01-12 DIAGNOSIS — I69998 Other sequelae following unspecified cerebrovascular disease: Secondary | ICD-10-CM | POA: Diagnosis not present

## 2013-01-13 ENCOUNTER — Other Ambulatory Visit: Payer: Self-pay | Admitting: Radiology

## 2013-01-13 ENCOUNTER — Other Ambulatory Visit (HOSPITAL_COMMUNITY): Payer: Self-pay | Admitting: Interventional Radiology

## 2013-01-13 DIAGNOSIS — M25579 Pain in unspecified ankle and joints of unspecified foot: Secondary | ICD-10-CM | POA: Diagnosis not present

## 2013-01-13 DIAGNOSIS — I639 Cerebral infarction, unspecified: Secondary | ICD-10-CM

## 2013-01-13 DIAGNOSIS — M6281 Muscle weakness (generalized): Secondary | ICD-10-CM | POA: Diagnosis not present

## 2013-01-13 DIAGNOSIS — I69998 Other sequelae following unspecified cerebrovascular disease: Secondary | ICD-10-CM | POA: Diagnosis not present

## 2013-01-14 DIAGNOSIS — M6281 Muscle weakness (generalized): Secondary | ICD-10-CM | POA: Diagnosis not present

## 2013-01-14 DIAGNOSIS — M25579 Pain in unspecified ankle and joints of unspecified foot: Secondary | ICD-10-CM | POA: Diagnosis not present

## 2013-01-14 DIAGNOSIS — I69998 Other sequelae following unspecified cerebrovascular disease: Secondary | ICD-10-CM | POA: Diagnosis not present

## 2013-01-15 DIAGNOSIS — I69998 Other sequelae following unspecified cerebrovascular disease: Secondary | ICD-10-CM | POA: Diagnosis not present

## 2013-01-15 DIAGNOSIS — M25579 Pain in unspecified ankle and joints of unspecified foot: Secondary | ICD-10-CM | POA: Diagnosis not present

## 2013-01-15 DIAGNOSIS — M6281 Muscle weakness (generalized): Secondary | ICD-10-CM | POA: Diagnosis not present

## 2013-01-18 DIAGNOSIS — M25579 Pain in unspecified ankle and joints of unspecified foot: Secondary | ICD-10-CM | POA: Diagnosis not present

## 2013-01-18 DIAGNOSIS — I69998 Other sequelae following unspecified cerebrovascular disease: Secondary | ICD-10-CM | POA: Diagnosis not present

## 2013-01-18 DIAGNOSIS — M6281 Muscle weakness (generalized): Secondary | ICD-10-CM | POA: Diagnosis not present

## 2013-01-19 ENCOUNTER — Ambulatory Visit (HOSPITAL_COMMUNITY)
Admission: RE | Admit: 2013-01-19 | Discharge: 2013-01-19 | Disposition: A | Discharge status: Home / Self Care | Payer: Medicare Other | Source: Ambulatory Visit | Attending: Interventional Radiology | Admitting: Interventional Radiology

## 2013-01-19 DIAGNOSIS — Z8673 Personal history of transient ischemic attack (TIA), and cerebral infarction without residual deficits: Secondary | ICD-10-CM | POA: Insufficient documentation

## 2013-01-19 DIAGNOSIS — Z09 Encounter for follow-up examination after completed treatment for conditions other than malignant neoplasm: Secondary | ICD-10-CM | POA: Insufficient documentation

## 2013-01-19 DIAGNOSIS — M6281 Muscle weakness (generalized): Secondary | ICD-10-CM | POA: Diagnosis not present

## 2013-01-19 DIAGNOSIS — I69998 Other sequelae following unspecified cerebrovascular disease: Secondary | ICD-10-CM | POA: Diagnosis not present

## 2013-01-19 DIAGNOSIS — M25579 Pain in unspecified ankle and joints of unspecified foot: Secondary | ICD-10-CM | POA: Diagnosis not present

## 2013-01-19 DIAGNOSIS — I639 Cerebral infarction, unspecified: Secondary | ICD-10-CM

## 2013-01-19 DIAGNOSIS — I6529 Occlusion and stenosis of unspecified carotid artery: Secondary | ICD-10-CM

## 2013-01-19 NOTE — Progress Notes (Signed)
VASCULAR LAB PRELIMINARY  PRELIMINARY  PRELIMINARY  PRELIMINARY  Carotid duplex  completed.    Preliminary report:  Right stent patent.  No stenosis noted.  Left ICA fed by retrograde branch of ECA.  Bilateral vertebral artery flow antegrade.  No change noted since previos study of 07-14-12.  Neriah Brott, RVT 01/19/2013, 12:00 PM

## 2013-01-20 DIAGNOSIS — M6281 Muscle weakness (generalized): Secondary | ICD-10-CM | POA: Diagnosis not present

## 2013-01-20 DIAGNOSIS — M25579 Pain in unspecified ankle and joints of unspecified foot: Secondary | ICD-10-CM | POA: Diagnosis not present

## 2013-01-20 DIAGNOSIS — I69998 Other sequelae following unspecified cerebrovascular disease: Secondary | ICD-10-CM | POA: Diagnosis not present

## 2013-01-21 DIAGNOSIS — M6281 Muscle weakness (generalized): Secondary | ICD-10-CM | POA: Diagnosis not present

## 2013-01-21 DIAGNOSIS — I69998 Other sequelae following unspecified cerebrovascular disease: Secondary | ICD-10-CM | POA: Diagnosis not present

## 2013-01-21 DIAGNOSIS — M25579 Pain in unspecified ankle and joints of unspecified foot: Secondary | ICD-10-CM | POA: Diagnosis not present

## 2013-01-22 DIAGNOSIS — M6281 Muscle weakness (generalized): Secondary | ICD-10-CM | POA: Diagnosis not present

## 2013-01-22 DIAGNOSIS — M25579 Pain in unspecified ankle and joints of unspecified foot: Secondary | ICD-10-CM | POA: Diagnosis not present

## 2013-01-22 DIAGNOSIS — I69998 Other sequelae following unspecified cerebrovascular disease: Secondary | ICD-10-CM | POA: Diagnosis not present

## 2013-01-23 DIAGNOSIS — M6281 Muscle weakness (generalized): Secondary | ICD-10-CM | POA: Diagnosis not present

## 2013-01-23 DIAGNOSIS — M25579 Pain in unspecified ankle and joints of unspecified foot: Secondary | ICD-10-CM | POA: Diagnosis not present

## 2013-01-23 DIAGNOSIS — I69998 Other sequelae following unspecified cerebrovascular disease: Secondary | ICD-10-CM | POA: Diagnosis not present

## 2013-01-24 DIAGNOSIS — M6281 Muscle weakness (generalized): Secondary | ICD-10-CM | POA: Diagnosis not present

## 2013-01-24 DIAGNOSIS — M25579 Pain in unspecified ankle and joints of unspecified foot: Secondary | ICD-10-CM | POA: Diagnosis not present

## 2013-01-24 DIAGNOSIS — I69998 Other sequelae following unspecified cerebrovascular disease: Secondary | ICD-10-CM | POA: Diagnosis not present

## 2013-01-25 ENCOUNTER — Telehealth (HOSPITAL_COMMUNITY): Payer: Self-pay | Admitting: Interventional Radiology

## 2013-01-25 DIAGNOSIS — I69998 Other sequelae following unspecified cerebrovascular disease: Secondary | ICD-10-CM | POA: Diagnosis not present

## 2013-01-25 DIAGNOSIS — M25579 Pain in unspecified ankle and joints of unspecified foot: Secondary | ICD-10-CM | POA: Diagnosis not present

## 2013-01-25 DIAGNOSIS — M6281 Muscle weakness (generalized): Secondary | ICD-10-CM | POA: Diagnosis not present

## 2013-01-25 NOTE — Telephone Encounter (Signed)
Called pt left VM that we will contact her in 6 months time for [place] f/u and to continue on her Aspirin and Plavix per Dr. Wilmon Arms

## 2013-01-26 DIAGNOSIS — M6281 Muscle weakness (generalized): Secondary | ICD-10-CM | POA: Diagnosis not present

## 2013-01-26 DIAGNOSIS — M25579 Pain in unspecified ankle and joints of unspecified foot: Secondary | ICD-10-CM | POA: Diagnosis not present

## 2013-01-26 DIAGNOSIS — I69998 Other sequelae following unspecified cerebrovascular disease: Secondary | ICD-10-CM | POA: Diagnosis not present

## 2013-01-28 DIAGNOSIS — I69998 Other sequelae following unspecified cerebrovascular disease: Secondary | ICD-10-CM | POA: Diagnosis not present

## 2013-01-28 DIAGNOSIS — M25579 Pain in unspecified ankle and joints of unspecified foot: Secondary | ICD-10-CM | POA: Diagnosis not present

## 2013-01-28 DIAGNOSIS — M6281 Muscle weakness (generalized): Secondary | ICD-10-CM | POA: Diagnosis not present

## 2013-01-31 DIAGNOSIS — M25579 Pain in unspecified ankle and joints of unspecified foot: Secondary | ICD-10-CM | POA: Diagnosis not present

## 2013-01-31 DIAGNOSIS — I69998 Other sequelae following unspecified cerebrovascular disease: Secondary | ICD-10-CM | POA: Diagnosis not present

## 2013-02-01 DIAGNOSIS — I69998 Other sequelae following unspecified cerebrovascular disease: Secondary | ICD-10-CM | POA: Diagnosis not present

## 2013-02-01 DIAGNOSIS — M25579 Pain in unspecified ankle and joints of unspecified foot: Secondary | ICD-10-CM | POA: Diagnosis not present

## 2013-02-02 DIAGNOSIS — I69998 Other sequelae following unspecified cerebrovascular disease: Secondary | ICD-10-CM | POA: Diagnosis not present

## 2013-02-02 DIAGNOSIS — M25579 Pain in unspecified ankle and joints of unspecified foot: Secondary | ICD-10-CM | POA: Diagnosis not present

## 2013-02-03 DIAGNOSIS — M25579 Pain in unspecified ankle and joints of unspecified foot: Secondary | ICD-10-CM | POA: Diagnosis not present

## 2013-02-03 DIAGNOSIS — I69998 Other sequelae following unspecified cerebrovascular disease: Secondary | ICD-10-CM | POA: Diagnosis not present

## 2013-02-04 DIAGNOSIS — M25579 Pain in unspecified ankle and joints of unspecified foot: Secondary | ICD-10-CM | POA: Diagnosis not present

## 2013-02-04 DIAGNOSIS — I69998 Other sequelae following unspecified cerebrovascular disease: Secondary | ICD-10-CM | POA: Diagnosis not present

## 2013-02-07 DIAGNOSIS — M25579 Pain in unspecified ankle and joints of unspecified foot: Secondary | ICD-10-CM | POA: Diagnosis not present

## 2013-02-07 DIAGNOSIS — I69998 Other sequelae following unspecified cerebrovascular disease: Secondary | ICD-10-CM | POA: Diagnosis not present

## 2013-02-08 ENCOUNTER — Non-Acute Institutional Stay (SKILLED_NURSING_FACILITY): Payer: Medicare Other | Admitting: Nurse Practitioner

## 2013-02-08 ENCOUNTER — Encounter: Payer: Self-pay | Admitting: Nurse Practitioner

## 2013-02-08 DIAGNOSIS — K5900 Constipation, unspecified: Secondary | ICD-10-CM

## 2013-02-08 DIAGNOSIS — M25571 Pain in right ankle and joints of right foot: Secondary | ICD-10-CM

## 2013-02-08 DIAGNOSIS — I639 Cerebral infarction, unspecified: Secondary | ICD-10-CM

## 2013-02-08 DIAGNOSIS — E1149 Type 2 diabetes mellitus with other diabetic neurological complication: Secondary | ICD-10-CM | POA: Diagnosis not present

## 2013-02-08 DIAGNOSIS — I10 Essential (primary) hypertension: Secondary | ICD-10-CM

## 2013-02-08 DIAGNOSIS — M25559 Pain in unspecified hip: Secondary | ICD-10-CM

## 2013-02-08 DIAGNOSIS — M25551 Pain in right hip: Secondary | ICD-10-CM

## 2013-02-08 DIAGNOSIS — G8110 Spastic hemiplegia affecting unspecified side: Secondary | ICD-10-CM

## 2013-02-08 DIAGNOSIS — M25579 Pain in unspecified ankle and joints of unspecified foot: Secondary | ICD-10-CM

## 2013-02-08 DIAGNOSIS — I6350 Cerebral infarction due to unspecified occlusion or stenosis of unspecified cerebral artery: Secondary | ICD-10-CM | POA: Diagnosis not present

## 2013-02-08 NOTE — Progress Notes (Signed)
Patient ID: Chelsey Gallagher, female   DOB: Jun 03, 1950, 62 y.o.   MRN: [id number]    Nursing Home Location:  [facility]   Place of Service: SNF (31)  PCP: Kimber Relic, MD  Allergies  Allergen Reactions  . Codeine Other (See Comments)    Per Mar  . Flexeril [Cyclobenzaprine Hcl] Other (See Comments)    Per Mar  . Lyrica [Pregabalin] Other (See Comments)    "wires my up"    Chief Complaint  Patient presents with  . Medical Managment of Chronic Issues    HPI:  62 year old female with a pmh of CVA with spastic hemiplegia, constipation, anxiety, DM, HTN, and hyperlipidemia is being seen today for routine follow up.  Pt reports right ankle pain has improved with therapy; would like to have biofreeze PRN Has not seen ortho related to  trigger finger on her left middle finger; more frequently getting stuck and having to manuell fix it and would like to see ortho for her right hip pain; hx of total hip replacement Reassessment of ongoing issues:  Anemia-  pt refusing iron  Constipation- with diarrhea at times; pt now taking miralax as scheduled;on senakot S BID Anxiety with depression- stable on cymbalta 60 mg daily  DM- improved but not at goal; taking metformin 1000 mg BID and lantus 8 units qhs; still with elevation in fasting blood sugars over 150s  Hyperlipidemia- tolerating pravastatin without side effects  Spastic hemiplegia-pt is on gabapentin and baclofen   Review of Systems:  Review of Systems  Constitutional: Negative for fever, chills, weight loss and malaise/fatigue.  Eyes: Negative for blurred vision and double vision.  Respiratory: Negative for cough and sputum production.   Cardiovascular: Negative for chest pain, palpitations and leg swelling.  Gastrointestinal: Positive for diarrhea and constipation (mostly constipated). Negative for heartburn and abdominal pain.  Genitourinary: Negative for dysuria, urgency and frequency.  Musculoskeletal:  Positive for joint pain (ankle and hip). Negative for myalgias.  Skin: Negative for itching and rash.  Neurological: Negative for weakness and headaches. Dizziness: at times - has orthostatic hypotension.  Psychiatric/Behavioral: Negative for depression. The patient is not nervous/anxious.      Past Medical History  Diagnosis Date  . Diabetes mellitus   . Hypertension   . Depression   . PAD (peripheral artery disease)     S/p bypass grafting, unclear exactly where  . Nephrolithiasis   . Hyperlipidemia 05/14/2011  . Hearing loss   . Complication of anesthesia     doesn't wake up good- "usually end up in ICU"  . PONV (postoperative nausea and vomiting)   . Orthostatic hypotension     after surgery.  Marland Kitchen Heart murmur     PCP- [facility]- No tx needed.  . Stroke 05/2011    Weakness on Right. Wears leg brace.  . Foot drop, left   . H/O hiatal hernia     second one  . Neuromuscular disorder     Diabetic neuropathy- has inproved since she quit smoking.  . Arthritis    Past Surgical History  Procedure Laterality Date  . Total hip arthroplasty  2008    Right  . Abdominal hysterectomy  2003  . Cesarean section  1984  . Cholecystectomy  1984  . Bypass graft  2003    Abdominal aortic  . Kidney stone surgery  1999  . Appendectomy  2011  . Hernia repair  2011  . Lithotripsy    .  Abdominal hysterectomy     Social History:   reports that she has quit smoking. Her smoking use included Cigarettes. She smoked 0.00 packs per day for 45 years. She quit smokeless tobacco use about 21 months ago. She reports that she does not drink alcohol or use illicit drugs.  Family History  Problem Relation Age of Onset  . Cancer Maternal Aunt     breast    Medications: Patient's Medications  New Prescriptions   No medications on file  Previous Medications   ACETAMINOPHEN (TYLENOL) 325 MG TABLET    Take 650 mg by mouth every 6 (six) hours as needed for pain.   ASPIRIN EC 81 MG  TABLET    Take 81 mg by mouth daily.   BACLOFEN (LIORESAL) 10 MG TABLET    Take 5 mg by mouth 2 (two) times daily. 5 mg at 8a and 2p and 20 mg at hs   BISACODYL (DULCOLAX) 10 MG SUPPOSITORY    Place 10 mg rectally as needed for constipation.   CHOLECALCIFEROL (VITAMIN D) 1000 UNITS TABLET    Take 1,000 Units by mouth daily.   CLOPIDOGREL (PLAVIX) 75 MG TABLET    Take 75 mg by mouth daily.   DULOXETINE (CYMBALTA) 30 MG CAPSULE    Take 60 mg by mouth daily.   GABAPENTIN (NEURONTIN) 300 MG CAPSULE    Take 300 mg by mouth at bedtime.   INSULIN GLARGINE (LANTUS) 100 UNIT/ML INJECTION    Inject 10 Units into the skin at bedtime.    LISINOPRIL (PRINIVIL,ZESTRIL) 20 MG TABLET    Take 10 mg by mouth 2 (two) times daily.    MAGNESIUM HYDROXIDE (MILK OF MAGNESIA) 800 MG/5ML SUSPENSION    Take by mouth daily as needed for constipation. 30cc   METFORMIN (GLUCOPHAGE) 500 MG TABLET    Take 1,000 mg by mouth 2 (two) times daily with a meal.    MIDODRINE (PROAMATINE) 5 MG TABLET    Take 5 mg by mouth 3 (three) times daily.   POLYETHYLENE GLYCOL (MIRALAX / GLYCOLAX) PACKET    Take 17 g by mouth daily.   PRAVASTATIN (PRAVACHOL) 40 MG TABLET    Take 40 mg by mouth daily.    SENNOSIDES-DOCUSATE SODIUM (SENOKOT-S) 8.6-50 MG TABLET    Take 1 tablet by mouth 2 (two) times daily.   TRAMADOL (ULTRAM) 50 MG TABLET    Take 2 tablets (100 mg total) by mouth every 8 (eight) hours as needed for pain.  Modified Medications   No medications on file  Discontinued Medications   No medications on file     Physical Exam:  Filed Vitals:   02/08/13 1732  BP: 144/74  Pulse: 92  Temp: 98.9 F (37.2 C)  Resp: 18   Physical Exam  Vitals reviewed.  Constitutional: She is well-developed, well-nourished, and in no distress. No distress.  HEENT: unremarkable Neck: Normal range of motion. Neck supple.  Cardiovascular: Normal rate and regular rhythm.  Murmur heard.  Pulmonary/Chest: Effort normal and breath sounds normal. No  respiratory distress. She has no wheezes.  Abdominal: Soft. Bowel sounds are normal. She exhibits no distension. There is no tenderness. There is no rebound and no guarding.  Musculoskeletal: She exhibits no edema and no tenderness. Limited exam due to being in the [facility] No swelling to ankle  Neurological: She is alert. Right sided hemiparesis  Skin: Skin is warm and dry. She is not diaphoretic.  Psychiatric: Affect normal.    Labs reviewed: CBC with  Diff  Result: 09/09/2012 11:28 AM ( Status: F )  WBC 5.6 4.0-10.5 K/uL SLN  RBC 3.53 L 3.87-5.11 MIL/uL SLN  Hemoglobin 10.3 L 12.0-15.0 g/dL SLN  Hematocrit 16.1 L 36.0-46.0 % SLN  MCV 86.7 78.0-100.0 fL SLN  MCH 29.2 26.0-34.0 pg SLN  MCHC 33.7 30.0-36.0 g/dL SLN  RDW 09.6 04.5-40.9 % SLN  Platelet Count 146 L 150-400 K/uL SLN  Granulocyte % 56 43-77 % SLN  Absolute Gran 3.1 1.7-7.7 K/uL SLN  Lymph % 30 12-46 % SLN  Absolute Lymph 1.7 0.7-4.0 K/uL SLN  Mono % 12 3-12 % SLN  Absolute Mono 0.7 0.1-1.0 K/uL SLN  Eos % 2 0-5 % SLN  Absolute Eos 0.1 0.0-0.7 K/uL SLN  Baso % 0 0-1 % SLN  Absolute Baso 0.0 0.0-0.1 K/uL SLN  Smear Review Criteria for review not met SLN  Basic Metabolic Panel  Result: 09/09/2012 12:18 PM ( Status: F )  Sodium 136 135-145 mEq/L SLN  Potassium 3.9 3.5-5.3 mEq/L SLN  Chloride 104 96-112 mEq/L SLN  CO2 27 19-32 mEq/L SLN  Glucose 97 70-99 mg/dL SLN  BUN 21 8-11 mg/dL SLN  Creatinine 9.14 7.82-9.56 mg/dL SLN  Calcium 9.3 2.1-30.8 mg/dL SLN    Comprehensive Metabolic Panel  Result: 11/03/2012 12:23 PM ( Status: F )  Sodium 137 135-145 mEq/L SLN  Potassium 4.4 3.5-5.3 mEq/L SLN  Chloride 99 96-112 mEq/L SLN  CO2 27 19-32 mEq/L SLN  Glucose 129 H 70-99 mg/dL SLN  BUN 20 6-57 mg/dL SLN  Creatinine 8.46 9.62-9.52 mg/dL SLN  Bilirubin, Total 0.4 0.3-1.2 mg/dL SLN  Alkaline Phosphatase 68 39-117 U/L SLN  AST/SGOT 16 0-37 U/L SLN  ALT/SGPT 20 0-35 U/L SLN  Total Protein 6.8 6.0-8.3 g/dL SLN  Albumin 3.7  8.4-1.3 g/dL SLN  Calcium 9.6 2.4-40.1 mg/dL SLN  TSH, Ultrasensitive  Result: 11/03/2012 1:03 PM ( Status: F )  TSH 1.277 0.350-4.500 uIU/mL SLN  Vitamin D (25-Hydroxy)  Result: 11/03/2012 1:50 PM ( Status: F )  Vitamin D (25-Hydroxy) 17 L 30-89 ng/mL SLN C  Test Description Results Abnormal Reference Range Units Lab Note  CBC NO Diff (Complete Blood Count)  Result: 10/28/2012 2:26 PM ( Status: F )  WBC 5.5 4.0-10.5 K/uL SLN  RBC 3.80 L 3.87-5.11 MIL/uL SLN  Hemoglobin 11.4 L 12.0-15.0 g/dL SLN  Hematocrit 02.7 L 36.0-46.0 % SLN  MCV 89.7 78.0-100.0 fL SLN  MCH 30.0 26.0-34.0 pg SLN  MCHC 33.4 30.0-36.0 g/dL SLN  RDW 25.3 66.4-40.3 % SLN  Platelet Count 153 150-400 K/uL SLN  Comprehensive Metabolic Panel  Result: 10/28/2012 4:06 PM ( Status: F )  Sodium 138 135-145 mEq/L SLN  Potassium 4.0 3.5-5.3 mEq/L SLN  Chloride 105 96-112 mEq/L SLN  CO2 25 19-32 mEq/L SLN  Glucose 163 H 70-99 mg/dL SLN  BUN 25 H 4-74 mg/dL SLN  Creatinine 2.59 5.63-8.75 mg/dL SLN  Bilirubin, Total 0.3 0.3-1.2 mg/dL SLN  Alkaline Phosphatase 63 39-117 U/L SLN  AST/SGOT 24 0-37 U/L SLN  ALT/SGPT 25 0-35 U/L SLN  Total Protein 6.6 6.0-8.3 g/dL SLN  Albumin 3.7 6.4-3.3 g/dL SLN  Calcium 9.3 2.9-51.8 mg/dL SLN  TSH, Ultrasensitive  Result: 10/28/2012 4:04 PM ( Status: F )  TSH 1.086 0.350-4.500 uIU/mL SLN  Vitamin D (25-Hydroxy)  Result: 10/29/2012 1:32 AM ( Status: F )  Vitamin D (25-Hydroxy) 18  CBC with 3 part Diff  Result: 12/21/2012 12:02 PM ( Status: F )  WBC 7.3 4.0-10.5 K/uL WML  RBC 3.53  L 3.87-5.11 MIL/uL WML  Hemoglobin 9.9 L 12.0-15.0 g/dL WML  Hematocrit 40.9 L 36.0-46.0 % WML  MCV 91.5 78.0-100.0 fL WML  MCH 28.0 26.0-34.0 pg WML  MCHC 30.5 30.0-36.0 g/dL WML  RDW 81.1 91.4-78.2 % WML  Platelet Count 165 150-400 K/uL WML  Granulocyte % 63 43-77 % WML  Absolute Gran 4.6 1.7-7.7 K/uL WML  Lymph % 31 12-46 % WML  Absolute Lymph 2.3 0.7-4.0 K/uL WML  Mono % 6 3-12 % WML  Absolute Mono 0.4  0.1-1.0 K/uL WML  Smear Review Criteria for review not met WML  Basic Metabolic Panel  Result: 12/21/2012 12:03 PM ( Status: F )  Sodium 138 135-145 mEq/L WML  Potassium 4.1 3.5-5.3 mEq/L WML  Chloride 106 96-112 mEq/L WML  CO2 26 19-32 mEq/L WML  Glucose 137 H 70-99 mg/dL WML  BUN 25 H 9-56 mg/dL WML  Creatinine 2.13 0.86-5.78 mg/dL WML  Calcium 9.8      Assessment/Plan 1. Stroke -with right sided hemiglegia; increase in pain otherwise remains stable; conts on ASA  2. Hypertension -stable at this time  3. Unspecified constipation -with constipation and diarrhea at times -will add benefiber daily  4. Type II or unspecified type diabetes mellitus with neurological manifestations, uncontrolled(250.62) -improved but not at gaol; will increase lantus to 12 units at this time -cbgs BID  5. Spastic hemiplegia affecting dominant side -unchanged; conts to have freq complaints of pain  6. 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 Pain in joint, ankle and foot, right -therapy has helped -may use biofreeze QID PRN  8. Vit D Def - Vit D level WNL; will cont vit d 1000 units daily

## 2013-03-01 DIAGNOSIS — I69998 Other sequelae following unspecified cerebrovascular disease: Secondary | ICD-10-CM | POA: Diagnosis not present

## 2013-03-01 DIAGNOSIS — M25579 Pain in unspecified ankle and joints of unspecified foot: Secondary | ICD-10-CM | POA: Diagnosis not present

## 2013-03-02 DIAGNOSIS — N390 Urinary tract infection, site not specified: Secondary | ICD-10-CM | POA: Diagnosis not present

## 2013-03-08 DIAGNOSIS — N390 Urinary tract infection, site not specified: Secondary | ICD-10-CM | POA: Diagnosis not present

## 2013-03-08 DIAGNOSIS — B9689 Other specified bacterial agents as the cause of diseases classified elsewhere: Secondary | ICD-10-CM | POA: Diagnosis not present

## 2013-03-14 DIAGNOSIS — Z96649 Presence of unspecified artificial hip joint: Secondary | ICD-10-CM | POA: Diagnosis not present

## 2013-03-14 DIAGNOSIS — M79609 Pain in unspecified limb: Secondary | ICD-10-CM | POA: Diagnosis not present

## 2013-03-14 DIAGNOSIS — M65839 Other synovitis and tenosynovitis, unspecified forearm: Secondary | ICD-10-CM | POA: Diagnosis not present

## 2013-03-14 DIAGNOSIS — M19049 Primary osteoarthritis, unspecified hand: Secondary | ICD-10-CM | POA: Diagnosis not present

## 2013-03-14 DIAGNOSIS — Z471 Aftercare following joint replacement surgery: Secondary | ICD-10-CM | POA: Diagnosis not present

## 2013-03-14 DIAGNOSIS — M949 Disorder of cartilage, unspecified: Secondary | ICD-10-CM | POA: Diagnosis not present

## 2013-03-14 DIAGNOSIS — M25579 Pain in unspecified ankle and joints of unspecified foot: Secondary | ICD-10-CM | POA: Diagnosis not present

## 2013-03-14 DIAGNOSIS — M259 Joint disorder, unspecified: Secondary | ICD-10-CM | POA: Diagnosis not present

## 2013-03-16 ENCOUNTER — Non-Acute Institutional Stay (SKILLED_NURSING_FACILITY): Payer: Medicare Other | Admitting: Nurse Practitioner

## 2013-03-16 DIAGNOSIS — R300 Dysuria: Secondary | ICD-10-CM

## 2013-03-16 DIAGNOSIS — J811 Chronic pulmonary edema: Secondary | ICD-10-CM | POA: Diagnosis not present

## 2013-03-16 DIAGNOSIS — R059 Cough, unspecified: Secondary | ICD-10-CM

## 2013-03-16 DIAGNOSIS — Z0000 Encounter for general adult medical examination without abnormal findings: Secondary | ICD-10-CM | POA: Diagnosis not present

## 2013-03-16 DIAGNOSIS — R05 Cough: Secondary | ICD-10-CM

## 2013-03-16 DIAGNOSIS — D649 Anemia, unspecified: Secondary | ICD-10-CM | POA: Diagnosis not present

## 2013-03-16 NOTE — Progress Notes (Signed)
Patient ID: Chelsey Gallagher, female   DOB: [birth year], 63 y.o.   MRN: [id number]    Nursing Home Location:  [place] of Service: SNF (31)  PCP: Estill Dooms, MD  Allergies  Allergen Reactions  . Codeine Other (See Comments)    Per Mar  . Flexeril [Cyclobenzaprine Hcl] Other (See Comments)    Per Mar  . Lyrica [Pregabalin] Other (See Comments)    "wires my up"    Chief Complaint  Patient presents with  . Acute Visit    not feeling well     HPI:  63 year old female being seen today due to not feeling well; pt reports she has notified staff for several days that she has not felt well and finally has just decided to come see me; increase fatigue and malaise now with fever or 102.1, pt reports worsening pain with urination. Also noted to have cough and congestion for several days and now with a fever or 102 Denies shortness of breath, chest pain, no N/V/D Review of Systems:  Review of Systems  Constitutional: Positive for fever, chills and malaise/fatigue.  HENT: Positive for congestion. Negative for ear discharge, ear pain and sore throat.   Respiratory: Positive for cough and sputum production. Negative for shortness of breath and wheezing.   Cardiovascular: Negative for chest pain and palpitations.  Gastrointestinal: Negative for heartburn, nausea, vomiting, abdominal pain, diarrhea, constipation and blood in stool.  Genitourinary: Positive for dysuria and frequency.  Musculoskeletal: Negative for myalgias.  Skin: Negative.   Neurological: Negative for headaches.     Past Medical History  Diagnosis Date  . Diabetes mellitus   . Hypertension   . Depression   . PAD (peripheral artery disease)     S/p bypass grafting, unclear exactly where  . Nephrolithiasis   . Hyperlipidemia 05/14/2011  . Hearing loss   . Complication of anesthesia     doesn't wake up good- "usually end up in ICU"  . PONV (postoperative nausea and vomiting)   .  Orthostatic hypotension     after surgery.  Marland Kitchen Heart murmur     PCP- [facility]- No tx needed.  . Stroke 05/2011    Weakness on Right. Wears leg brace.  . Foot drop, left   . H/O hiatal hernia     second one  . Neuromuscular disorder     Diabetic neuropathy- has inproved since she quit smoking.  . Arthritis    Past Surgical History  Procedure Laterality Date  . Total hip arthroplasty  2008    Right  . Abdominal hysterectomy  2003  . Cesarean section  1984  . Cholecystectomy  1984  . Bypass graft  2003    Abdominal aortic  . Kidney stone surgery  1999  . Appendectomy  2011  . Hernia repair  2011  . Lithotripsy    . Abdominal hysterectomy     Social History:   reports that she has quit smoking. Her smoking use included Cigarettes. She smoked 0.00 packs per day for 45 years. She quit smokeless tobacco use about 22 months ago. She reports that she does not drink alcohol or use illicit drugs.  Family History  Problem Relation Age of Onset  . Cancer Maternal Aunt     breast    Medications: Patient's Medications  New Prescriptions   No medications on file  Previous Medications   ACETAMINOPHEN (TYLENOL) 325 MG TABLET    Take 650  mg by mouth every 6 (six) hours as needed for pain.   ASPIRIN EC 81 MG TABLET    Take 81 mg by mouth daily.   BACLOFEN (LIORESAL) 10 MG TABLET    Take 5 mg by mouth 2 (two) times daily. 5 mg at 8a and 2p and 20 mg at hs   BISACODYL (DULCOLAX) 10 MG SUPPOSITORY    Place 10 mg rectally as needed for constipation.   CHOLECALCIFEROL (VITAMIN D) 1000 UNITS TABLET    Take 1,000 Units by mouth daily.   CLOPIDOGREL (PLAVIX) 75 MG TABLET    Take 75 mg by mouth daily.   DULOXETINE (CYMBALTA) 30 MG CAPSULE    Take 60 mg by mouth daily.   GABAPENTIN (NEURONTIN) 300 MG CAPSULE    Take 300 mg by mouth at bedtime.   INSULIN GLARGINE (LANTUS) 100 UNIT/ML INJECTION    Inject 10 Units into the skin at bedtime.    LISINOPRIL (PRINIVIL,ZESTRIL) 20 MG TABLET     Take 10 mg by mouth 2 (two) times daily.    MAGNESIUM HYDROXIDE (MILK OF MAGNESIA) 800 MG/5ML SUSPENSION    Take by mouth daily as needed for constipation. 30cc   METFORMIN (GLUCOPHAGE) 500 MG TABLET    Take 1,000 mg by mouth 2 (two) times daily with a meal.    MIDODRINE (PROAMATINE) 5 MG TABLET    Take 5 mg by mouth 3 (three) times daily.   POLYETHYLENE GLYCOL (MIRALAX / GLYCOLAX) PACKET    Take 17 g by mouth daily.   PRAVASTATIN (PRAVACHOL) 40 MG TABLET    Take 40 mg by mouth daily.    SENNOSIDES-DOCUSATE SODIUM (SENOKOT-S) 8.6-50 MG TABLET    Take 1 tablet by mouth 2 (two) times daily.   TRAMADOL (ULTRAM) 50 MG TABLET    Take 2 tablets (100 mg total) by mouth every 8 (eight) hours as needed for pain.  Modified Medications   No medications on file  Discontinued Medications   No medications on file     Physical Exam: Physical Exam  Constitutional: No distress.  Ill appearing female  HENT:  Head: Normocephalic and atraumatic.  Mouth/Throat: Oropharynx is clear and moist. No oropharyngeal exudate.  Eyes: Conjunctivae and EOM are normal. Pupils are equal, round, and reactive to light.  Neck: Normal range of motion. Neck supple. No thyromegaly present.  Cardiovascular: Normal rate, regular rhythm and normal heart sounds.   Pulmonary/Chest: Effort normal. No respiratory distress. She has no wheezes.  Diminished breath sounds throughout  Abdominal: Soft. Bowel sounds are normal. She exhibits no distension. There is no tenderness.  Lymphadenopathy:    She has no cervical adenopathy.  Neurological: She is alert.  Skin: Skin is warm and dry. No erythema.  Psychiatric: Affect normal.    Filed Vitals:   03/16/13 2014  BP: 103/62  Pulse: 102  Temp: 102.1 F (38.9 C)  Resp: 22  SpO2: 97%      Labs reviewed: Basic Metabolic Panel:  Recent Labs  04/05/12 0345 04/06/12 0430 04/07/12 0435  NA 133* 135 135  K 4.3 4.3 3.7  CL 101 104 100  CO2 23 25 26   GLUCOSE 87 92 120*    BUN 48* 28* 18  CREATININE 0.93 0.84 0.62  CALCIUM 8.9 9.0 9.8   Liver Function Tests:  Recent Labs  04/05/12 0345 04/06/12 0430 04/07/12 0435  AST 21 24 22   ALT 16 18 19   ALKPHOS 60 62 79  BILITOT 0.1* 0.2* 0.3  PROT 5.9*  6.3 7.4  ALBUMIN 3.0* 3.1* 3.7   No results found for this basename: LIPASE, AMYLASE,  in the last 8760 hours No results found for this basename: AMMONIA,  in the last 8760 hours CBC:  Recent Labs  04/04/12 2144  04/05/12 1016 04/06/12 0430 04/07/12 0435  WBC 6.3  < > 6.6 5.7 7.1  NEUTROABS 4.0  --   --   --   --   HGB 8.4*  < > 9.1* 9.6* 11.3*  HCT 25.4*  < > 28.0* 29.1* 33.9*  MCV 87.6  < > 88.3 89.0 87.1  PLT 124*  < > 136* 139* 178  < > = values in this interval not displayed.   Assessment/Plan 1. Dysuria -reports worsening dysuria -will get UA c&s -increase hydration   2. Cough -with congestion, denies shortness of breath but has fever -will get chest xray -mucinex DM BID for 14 days   Labs/tests ordered Due to fever and increase flu prevelance will get influenza A&B swab, cbc, bmp  -VS 2 shift, nursing to monitor and notify with changes, or worsening in condition   -will start augmentin 875/125 mg PO BID for 7 days after urine as been draw

## 2013-03-17 DIAGNOSIS — N390 Urinary tract infection, site not specified: Secondary | ICD-10-CM | POA: Diagnosis not present

## 2013-03-18 DIAGNOSIS — F3289 Other specified depressive episodes: Secondary | ICD-10-CM | POA: Diagnosis not present

## 2013-03-18 DIAGNOSIS — F329 Major depressive disorder, single episode, unspecified: Secondary | ICD-10-CM | POA: Diagnosis not present

## 2013-03-29 ENCOUNTER — Non-Acute Institutional Stay (SKILLED_NURSING_FACILITY): Payer: Medicare Other | Admitting: Nurse Practitioner

## 2013-03-29 DIAGNOSIS — N390 Urinary tract infection, site not specified: Secondary | ICD-10-CM

## 2013-03-29 DIAGNOSIS — E1149 Type 2 diabetes mellitus with other diabetic neurological complication: Secondary | ICD-10-CM | POA: Diagnosis not present

## 2013-03-29 DIAGNOSIS — K5900 Constipation, unspecified: Secondary | ICD-10-CM | POA: Diagnosis not present

## 2013-03-29 DIAGNOSIS — D509 Iron deficiency anemia, unspecified: Secondary | ICD-10-CM

## 2013-03-29 DIAGNOSIS — I10 Essential (primary) hypertension: Secondary | ICD-10-CM | POA: Diagnosis not present

## 2013-03-29 DIAGNOSIS — E785 Hyperlipidemia, unspecified: Secondary | ICD-10-CM

## 2013-03-29 DIAGNOSIS — I6350 Cerebral infarction due to unspecified occlusion or stenosis of unspecified cerebral artery: Secondary | ICD-10-CM

## 2013-03-29 DIAGNOSIS — I639 Cerebral infarction, unspecified: Secondary | ICD-10-CM

## 2013-03-29 DIAGNOSIS — J111 Influenza due to unidentified influenza virus with other respiratory manifestations: Secondary | ICD-10-CM

## 2013-03-29 DIAGNOSIS — G8110 Spastic hemiplegia affecting unspecified side: Secondary | ICD-10-CM | POA: Diagnosis not present

## 2013-03-29 NOTE — Progress Notes (Signed)
Patient ID: Chelsey Gallagher, female   DOB: [birth year], 63 y.o.   MRN: [id number]    Nursing Home Location:  [place] of Service: SNF (31)  PCP: Estill Dooms, MD  Allergies  Allergen Reactions  . Codeine Other (See Comments)    Per Mar  . Flexeril [Cyclobenzaprine Hcl] Other (See Comments)    Per Mar  . Lyrica [Pregabalin] Other (See Comments)    "wires my up"    Chief Complaint  Patient presents with  . Medical Managment of Chronic Issues    HPI:  63 year old female with a pmh of CVA with spastic hemiplegia, constipation, anxiety, DM, HTN, and hyperlipidemia is being seen today for routine follow up.    Reassessment of ongoing issues:  Anemia- pt refusing iron; CBC was worse  Constipation- stable pt now taking miralax as scheduled;on senakot S BID  Anxiety with depression- stable on cymbalta 60 mg daily  DM- improved; taking metformin 1000 mg BID and lantus 12 units qhs Hyperlipidemia- tolerating pravastatin without side effects  Spastic hemiplegia-pt is on gabapentin and baclofen   In the last month pt was dx with influeuza A and was treated with tamiflu; pt also had UTI at the same time and has now finished Augmentin and is without symptoms; she was extremely weak and she slept a lot during her acute illness however now she reports she is better; strength and appetite is returning to baseline; currently without complaints or concerns   Review of Systems:  Review of Systems  Constitutional: Negative for fever, chills, weight loss and malaise/fatigue.  Eyes: Negative for blurred vision and double vision.  Respiratory: Negative for cough and sputum production.   Cardiovascular: Negative for chest pain, palpitations and leg swelling.  Gastrointestinal: Negative for heartburn, abdominal pain, diarrhea and constipation.  Genitourinary: Negative for dysuria, urgency and frequency.  Musculoskeletal: Negative for joint pain and myalgias.  Skin:  Negative for itching and rash.  Neurological: Negative for weakness and headaches. Dizziness: at times - has orthostatic hypotension.          Psychiatric/Behavioral: Negative for depression. The patient is not nervous/anxious.      Past Medical History  Diagnosis Date  . Diabetes mellitus   . Hypertension   . Depression   . PAD (peripheral artery disease)     S/p bypass grafting, unclear exactly where  . Nephrolithiasis   . Hyperlipidemia 05/14/2011  . Hearing loss   . Complication of anesthesia     doesn't wake up good- "usually end up in ICU"  . PONV (postoperative nausea and vomiting)   . Orthostatic hypotension     after surgery.  Marland Kitchen Heart murmur     PCP- [facility]- No tx needed.  . Stroke 05/2011    Weakness on Right. Wears leg brace.  . Foot drop, left   . H/O hiatal hernia     second one  . Neuromuscular disorder     Diabetic neuropathy- has inproved since she quit smoking.  . Arthritis    Past Surgical History  Procedure Laterality Date  . Total hip arthroplasty  2008    Right  . Abdominal hysterectomy  2003  . Cesarean section  1984  . Cholecystectomy  1984  . Bypass graft  2003    Abdominal aortic  . Kidney stone surgery  1999  . Appendectomy  2011  . Hernia repair  2011  . Lithotripsy    . Abdominal  hysterectomy     Social History:   reports that she has quit smoking. Her smoking use included Cigarettes. She smoked 0.00 packs per day for 45 years. She quit smokeless tobacco use about 22 months ago. She reports that she does not drink alcohol or use illicit drugs.  Family History  Problem Relation Age of Onset  . Cancer Maternal Aunt     breast    Medications: Patient's Medications  New Prescriptions   No medications on file  Previous Medications   ACETAMINOPHEN (TYLENOL) 325 MG TABLET    Take 650 mg by mouth every 6 (six) hours as needed for pain.   ASPIRIN EC 81 MG TABLET    Take 81 mg by mouth daily.   BACLOFEN (LIORESAL) 10 MG  TABLET    Take 5 mg by mouth 2 (two) times daily. 5 mg at 8a and 2p and 20 mg at hs   BISACODYL (DULCOLAX) 10 MG SUPPOSITORY    Place 10 mg rectally as needed for constipation.   CHOLECALCIFEROL (VITAMIN D) 1000 UNITS TABLET    Take 1,000 Units by mouth daily.   CLOPIDOGREL (PLAVIX) 75 MG TABLET    Take 75 mg by mouth daily.   DULOXETINE (CYMBALTA) 30 MG CAPSULE    Take 60 mg by mouth daily.   GABAPENTIN (NEURONTIN) 300 MG CAPSULE    Take 300 mg by mouth at bedtime.   INSULIN GLARGINE (LANTUS) 100 UNIT/ML INJECTION    Inject 10 Units into the skin at bedtime.    LISINOPRIL (PRINIVIL,ZESTRIL) 20 MG TABLET    Take 10 mg by mouth 2 (two) times daily.    MAGNESIUM HYDROXIDE (MILK OF MAGNESIA) 800 MG/5ML SUSPENSION    Take by mouth daily as needed for constipation. 30cc   METFORMIN (GLUCOPHAGE) 500 MG TABLET    Take 1,000 mg by mouth 2 (two) times daily with a meal.    MIDODRINE (PROAMATINE) 5 MG TABLET    Take 5 mg by mouth 3 (three) times daily.   POLYETHYLENE GLYCOL (MIRALAX / GLYCOLAX) PACKET    Take 17 g by mouth daily.   PRAVASTATIN (PRAVACHOL) 40 MG TABLET    Take 40 mg by mouth daily.    SENNOSIDES-DOCUSATE SODIUM (SENOKOT-S) 8.6-50 MG TABLET    Take 1 tablet by mouth 2 (two) times daily.   TRAMADOL (ULTRAM) 50 MG TABLET    Take 2 tablets (100 mg total) by mouth every 8 (eight) hours as needed for pain.  Modified Medications   No medications on file  Discontinued Medications   No medications on file     Physical Exam:  Filed Vitals:   03/29/13 1205  BP: 155/61  Pulse: 82  Temp: 97.2 F (36.2 C)  Resp: 20    Physical Exam  Vitals reviewed. Constitutional: She is oriented to person, place, and time and well-developed, well-nourished, and in no distress. No distress.  HENT:  Head: Normocephalic and atraumatic.  Right Ear: External ear normal.  Left Ear: External ear normal.  Nose: Nose normal.  Mouth/Throat: Oropharynx is clear and moist.  Eyes: Conjunctivae and EOM are  normal. Pupils are equal, round, and reactive to light.  Neck: Normal range of motion. Neck supple.  Cardiovascular: Normal rate and regular rhythm.   Murmur heard. Pulmonary/Chest: Effort normal and breath sounds normal. No respiratory distress. She has no wheezes.  Abdominal: Soft. Bowel sounds are normal. She exhibits no distension. There is no tenderness. There is no rebound and no guarding.  Musculoskeletal:  She exhibits no edema and no tenderness.  Neurological: She is alert and oriented to person, place, and time.  Right sided hemiparesis   Skin: Skin is warm and dry. She is not diaphoretic.  Psychiatric: Affect normal.     Labs reviewed: Basic Metabolic Panel:  Recent Labs  04/05/12 0345 04/06/12 0430 04/07/12 0435  NA 133* 135 135  K 4.3 4.3 3.7  CL 101 104 100  CO2 '23 25 26  ' GLUCOSE 87 92 120*  BUN 48* 28* 18  CREATININE 0.93 0.84 0.62  CALCIUM 8.9 9.0 9.8   Liver Function Tests:  Recent Labs  04/05/12 0345 04/06/12 0430 04/07/12 0435  AST '21 24 22  ' ALT '16 18 19  ' ALKPHOS 60 62 79  BILITOT 0.1* 0.2* 0.3  PROT 5.9* 6.3 7.4  ALBUMIN 3.0* 3.1* 3.7   No results found for this basename: LIPASE, AMYLASE,  in the last 8760 hours No results found for this basename: AMMONIA,  in the last 8760 hours CBC:  Recent Labs  04/04/12 2144  04/05/12 1016 04/06/12 0430 04/07/12 0435  WBC 6.3  < > 6.6 5.7 7.1  NEUTROABS 4.0  --   --   --   --   HGB 8.4*  < > 9.1* 9.6* 11.3*  HCT 25.4*  < > 28.0* 29.1* 33.9*  MCV 87.6  < > 88.3 89.0 87.1  PLT 124*  < > 136* 139* 178  < > = values in this interval not displayed. Cardiac Enzymes: No results found for this basename: CKTOTAL, CKMB, CKMBINDEX, TROPONINI,  in the last 8760 hours BNP: No components found with this basename: POCBNP,  CBG:  Recent Labs  04/07/12 0734 04/07/12 1152 07/02/12 2259  GLUCAP 115* 138* 183*   TSH:  Recent Labs  04/05/12 0345  TSH 0.060*   A1C: Lab Results  Component Value Date     HGBA1C 5.3 04/05/2012   CBC with Diff    Result: 03/17/2013 2:46 PM   ( Status: F )       WBC 5.6     4.0-10.5 K/uL SLN   RBC 2.87   L 3.87-5.11 MIL/uL SLN   Hemoglobin 7.4   L 12.0-15.0 g/dL SLN   Hematocrit 23.7   L 36.0-46.0 % SLN   MCV 82.6     78.0-100.0 fL SLN   MCH 25.8   L 26.0-34.0 pg SLN   MCHC 31.2     30.0-36.0 g/dL SLN   RDW 15.6   H 11.5-15.5 % SLN   Platelet Count 253     150-400 K/uL SLN   Granulocyte % 74     43-77 % SLN   Absolute Gran 4.1     1.7-7.7 K/uL SLN   Lymph % 16     12-46 % SLN   Absolute Lymph 0.9     0.7-4.0 K/uL SLN   Mono % 9     3-12 % SLN   Absolute Mono 0.5     0.1-1.0 K/uL SLN   Eos % 1     0-5 % SLN   Absolute Eos 0.0     0.0-0.7 K/uL SLN   Baso % 0     0-1 % SLN   Absolute Baso 0.0     0.0-0.1 K/uL SLN   Smear Review Criteria for review not met  SLN   Basic Metabolic Panel    Result: 03/17/2013 4:16 PM   ( Status: F )       Sodium  131   L 135-145 mEq/L SLN   Potassium 4.7     3.5-5.3 mEq/L SLN   Chloride 100     96-112 mEq/L SLN   CO2 20     19-32 mEq/L SLN   Glucose 130   H 70-99 mg/dL SLN   BUN 19     6-23 mg/dL SLN   Creatinine 0.79     0.50-1.10 mg/dL SLN   Calcium 9.5       Assessment/Plan 1. Hypertension -Patient is stable; continue current regimen. Will monitor and make changes as necessary.  2. Unspecified constipation -stable  3. Type II or unspecified type diabetes mellitus with neurological manifestations, uncontrolled(250.62) -blood sugars are at goal; will follow A1c  4. Spastic hemiplegia affecting dominant side -symptoms currently stable  5. CVA (cerebral infarction) -late effect; conts plavix and ASA  6. Anemia, iron deficiency -worse on last CBC will follow up; off iron due to constipation  7. Hyperlipidemia -conts on Pravachol   8. Influenza -was positive for influenza A completed tamiflu; overall feeling a lot better  9. UTI (urinary tract infection) -completed Augmentin symptoms have  resolved Labs/tests ordered Will follow up cbc; cmp, a1c

## 2013-04-02 DIAGNOSIS — Z48812 Encounter for surgical aftercare following surgery on the circulatory system: Secondary | ICD-10-CM | POA: Diagnosis not present

## 2013-04-07 DIAGNOSIS — S92309A Fracture of unspecified metatarsal bone(s), unspecified foot, initial encounter for closed fracture: Secondary | ICD-10-CM | POA: Diagnosis not present

## 2013-04-07 DIAGNOSIS — M25579 Pain in unspecified ankle and joints of unspecified foot: Secondary | ICD-10-CM | POA: Diagnosis not present

## 2013-04-07 DIAGNOSIS — M25469 Effusion, unspecified knee: Secondary | ICD-10-CM | POA: Diagnosis not present

## 2013-04-07 DIAGNOSIS — R279 Unspecified lack of coordination: Secondary | ICD-10-CM | POA: Diagnosis not present

## 2013-04-07 DIAGNOSIS — I69959 Hemiplegia and hemiparesis following unspecified cerebrovascular disease affecting unspecified side: Secondary | ICD-10-CM | POA: Diagnosis not present

## 2013-04-07 DIAGNOSIS — I69998 Other sequelae following unspecified cerebrovascular disease: Secondary | ICD-10-CM | POA: Diagnosis not present

## 2013-04-08 DIAGNOSIS — R279 Unspecified lack of coordination: Secondary | ICD-10-CM | POA: Diagnosis not present

## 2013-04-08 DIAGNOSIS — I69959 Hemiplegia and hemiparesis following unspecified cerebrovascular disease affecting unspecified side: Secondary | ICD-10-CM | POA: Diagnosis not present

## 2013-04-08 DIAGNOSIS — I69998 Other sequelae following unspecified cerebrovascular disease: Secondary | ICD-10-CM | POA: Diagnosis not present

## 2013-04-11 DIAGNOSIS — E785 Hyperlipidemia, unspecified: Secondary | ICD-10-CM | POA: Diagnosis not present

## 2013-04-11 DIAGNOSIS — I69959 Hemiplegia and hemiparesis following unspecified cerebrovascular disease affecting unspecified side: Secondary | ICD-10-CM | POA: Diagnosis not present

## 2013-04-11 DIAGNOSIS — I739 Peripheral vascular disease, unspecified: Secondary | ICD-10-CM | POA: Diagnosis not present

## 2013-04-11 DIAGNOSIS — R279 Unspecified lack of coordination: Secondary | ICD-10-CM | POA: Diagnosis not present

## 2013-04-11 DIAGNOSIS — I69998 Other sequelae following unspecified cerebrovascular disease: Secondary | ICD-10-CM | POA: Diagnosis not present

## 2013-04-12 DIAGNOSIS — I69959 Hemiplegia and hemiparesis following unspecified cerebrovascular disease affecting unspecified side: Secondary | ICD-10-CM | POA: Diagnosis not present

## 2013-04-12 DIAGNOSIS — I69998 Other sequelae following unspecified cerebrovascular disease: Secondary | ICD-10-CM | POA: Diagnosis not present

## 2013-04-12 DIAGNOSIS — R279 Unspecified lack of coordination: Secondary | ICD-10-CM | POA: Diagnosis not present

## 2013-04-13 ENCOUNTER — Encounter: Payer: Self-pay | Admitting: Internal Medicine

## 2013-04-13 ENCOUNTER — Non-Acute Institutional Stay (SKILLED_NURSING_FACILITY): Payer: Medicare Other | Admitting: Internal Medicine

## 2013-04-13 DIAGNOSIS — D509 Iron deficiency anemia, unspecified: Secondary | ICD-10-CM

## 2013-04-13 DIAGNOSIS — S92309A Fracture of unspecified metatarsal bone(s), unspecified foot, initial encounter for closed fracture: Secondary | ICD-10-CM | POA: Diagnosis not present

## 2013-04-13 DIAGNOSIS — IMO0001 Reserved for inherently not codable concepts without codable children: Secondary | ICD-10-CM | POA: Diagnosis not present

## 2013-04-13 DIAGNOSIS — R279 Unspecified lack of coordination: Secondary | ICD-10-CM | POA: Diagnosis not present

## 2013-04-13 DIAGNOSIS — I69998 Other sequelae following unspecified cerebrovascular disease: Secondary | ICD-10-CM | POA: Diagnosis not present

## 2013-04-13 DIAGNOSIS — I69959 Hemiplegia and hemiparesis following unspecified cerebrovascular disease affecting unspecified side: Secondary | ICD-10-CM | POA: Diagnosis not present

## 2013-04-13 NOTE — Assessment & Plan Note (Signed)
Well, pt 's Hb down to 5.6 but she is tolerating it very well so will not tranfuse as planned; MCV 76.3, iron studies in May 2014 were low so will start FeSO4 325 mg BID; recheck in 3 weeks

## 2013-04-13 NOTE — Progress Notes (Signed)
MRN: [id number] Name: Chelsey Gallagher  Sex: female Age: 63 y.o. DOB: 02-May-1950  [place] #: [name] Facility/Room: 126A Level Of Care: SNF Provider: Inocencio Homes D Emergency Contacts: Extended Emergency Contact Information Primary Emergency Contact: [name] of [place] Phone: [phone] Mobile Phone: [phone] Relation: Daughter  Code Status:   Allergies: Codeine; Flexeril; and Lyrica  Chief Complaint  Patient presents with  . Acute Visit    HPI: Patient is 63 y.o. female who is being seen today for a very low Hb.  Past Medical History  Diagnosis Date  . Diabetes mellitus   . Hypertension   . Depression   . PAD (peripheral artery disease)     S/p bypass grafting, unclear exactly where  . Nephrolithiasis   . Hyperlipidemia 05/14/2011  . Hearing loss   . Complication of anesthesia     doesn't wake up good- "usually end up in ICU"  . PONV (postoperative nausea and vomiting)   . Orthostatic hypotension     after surgery.  Marland Kitchen Heart murmur     PCP- [facility]- No tx needed.  . Stroke 05/2011    Weakness on Right. Wears leg brace.  . Foot drop, left   . H/O hiatal hernia     second one  . Neuromuscular disorder     Diabetic neuropathy- has inproved since she quit smoking.  . Arthritis     Past Surgical History  Procedure Laterality Date  . Total hip arthroplasty  2008    Right  . Abdominal hysterectomy  2003  . Cesarean section  1984  . Cholecystectomy  1984  . Bypass graft  2003    Abdominal aortic  . Kidney stone surgery  1999  . Appendectomy  2011  . Hernia repair  2011  . Lithotripsy    . Abdominal hysterectomy        Medication List       This list is accurate as of: 04/13/13  4:13 PM.  Always use your most recent med list.               acetaminophen 325 MG tablet  Commonly known as:  TYLENOL  Take 650 mg by mouth every 6 (six) hours as needed for pain.     aspirin EC 81 MG tablet  Take 81 mg by  mouth daily.     baclofen 10 MG tablet  Commonly known as:  LIORESAL  Take 5 mg by mouth 2 (two) times daily. 5 mg at 8a and 2p and 20 mg at hs     bisacodyl 10 MG suppository  Commonly known as:  DULCOLAX  Place 10 mg rectally as needed for constipation.     cholecalciferol 1000 UNITS tablet  Commonly known as:  VITAMIN D  Take 1,000 Units by mouth daily.     clopidogrel 75 MG tablet  Commonly known as:  PLAVIX  Take 75 mg by mouth daily.     DULoxetine 30 MG capsule  Commonly known as:  CYMBALTA  Take 60 mg by mouth daily.     gabapentin 300 MG capsule  Commonly known as:  NEURONTIN  Take 300 mg by mouth at bedtime.     insulin glargine 100 UNIT/ML injection  Commonly known as:  LANTUS  Inject 10 Units into the skin at bedtime.     lisinopril 20 MG tablet  Commonly known as:  PRINIVIL,ZESTRIL  Take 10 mg by mouth 2 (two) times daily.  magnesium hydroxide 800 MG/5ML suspension  Commonly known as:  MILK OF MAGNESIA  Take by mouth daily as needed for constipation. 30cc     metFORMIN 500 MG tablet  Commonly known as:  GLUCOPHAGE  Take 1,000 mg by mouth 2 (two) times daily with a meal.     midodrine 5 MG tablet  Commonly known as:  PROAMATINE  Take 5 mg by mouth 3 (three) times daily.     polyethylene glycol packet  Commonly known as:  MIRALAX / GLYCOLAX  Take 17 g by mouth daily.     pravastatin 40 MG tablet  Commonly known as:  PRAVACHOL  Take 40 mg by mouth daily.     sennosides-docusate sodium 8.6-50 MG tablet  Commonly known as:  SENOKOT-S  Take 1 tablet by mouth 2 (two) times daily.     traMADol 50 MG tablet  Commonly known as:  ULTRAM  Take 2 tablets (100 mg total) by mouth every 8 (eight) hours as needed for pain.        No orders of the defined types were placed in this encounter.    Immunization History  Administered Date(s) Administered  . Influenza-Unspecified 12/04/2011  . Pneumococcal Polysaccharide-23 09/03/2011  .  Pneumococcal-Unspecified 12/02/2010    History  Substance Use Topics  . Smoking status: Former Smoker -- 0.00 packs/day for 45 years    Types: Cigarettes  . Smokeless tobacco: Former Systems developer    Quit date: 05/08/2011  . Alcohol Use: No    Review of Systems  DATA OBTAINED: from patient, GENERAL: Feels well no fevers; maybe a little tired, has been getting over the flu SKIN: No itching, rash HEENT: No complaint RESPIRATORY: No cough, wheezing, SOB CARDIAC: No chest pain, palpitations, lower extremity edema  GI: No abdominal pain, No N/V/D or constipation, No heartburn or reflux  GU: No dysuria, frequency or urgency, or incontinence  MUSCULOSKELETAL: No unrelieved bone/joint pain NEUROLOGIC: No headache, dizziness or focal weakness PSYCHIATRIC: No overt anxiety or sadness.   Filed Vitals:   04/13/13 1605  BP: 111/70  Pulse: 90  Temp: 97.9 F (36.6 C)  Resp: 18    Physical Exam  GENERAL APPEARANCE: Alert, conversant. Appropriately groomed. No acute distress  SKIN: No diaphoresis rash, or wounds HEENT: Unremarkable RESPIRATORY: Breathing is even, unlabored. Lung sounds are clear   CARDIOVASCULAR: Heart RRR no murmurs, rubs or gallops. No peripheral edema  GASTROINTESTINAL: Abdomen is soft, non-tender, not distended w/ normal bowel sounds.  GENITOURINARY: Bladder non tender, not distended  MUSCULOSKELETAL:contractures NEUROLOGIC: Cranial nerves 2-12 grossly intact. L foot drop, R hemiparesis PSYCHIATRIC: Mood and affect appropriate to situation, no behavioral issues  Patient Active Problem List   Diagnosis Date Noted  . UTI (urinary tract infection) 07/02/2012  . Anemia, iron deficiency 07/02/2012  . Ingrown toenail 07/02/2012  . Unspecified constipation 07/02/2012  . 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 Unspecified cerebral artery occlusion with cerebral infarction 04/13/2012  . Orthostatic hypotension 04/06/2012   . Abnormal TSH 04/06/2012  . Spastic hemiplegia affecting dominant side 07/25/2011  . Stroke 05/15/2011  . Hyperlipidemia 05/14/2011  . Tobacco abuse 05/14/2011  . 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 Lung abnormality 05/11/2011  . CVA (cerebral infarction) 05/08/2011  . Hypertension   . Type II or unspecified type diabetes mellitus with neurological manifestations, uncontrolled(250.62)     CBC    Component Value Date/Time   WBC 7.1 04/07/2012 0435   RBC 3.89 04/07/2012 0435  RBC 2.99* 04/05/2012 0345   HGB 11.3* 04/07/2012 0435   HCT 33.9* 04/07/2012 0435   PLT 178 04/07/2012 0435   MCV 87.1 04/07/2012 0435   LYMPHSABS 1.8 04/04/2012 2144   MONOABS 0.5 04/04/2012 2144   EOSABS 0.1 04/04/2012 2144   BASOSABS 0.0 04/04/2012 2144    CMP     Component Value Date/Time   NA 135 04/07/2012 0435   K 3.7 04/07/2012 0435   CL 100 04/07/2012 0435   CO2 26 04/07/2012 0435   GLUCOSE 120* 04/07/2012 0435   BUN 18 04/07/2012 0435   CREATININE 0.62 04/07/2012 0435   CALCIUM 9.8 04/07/2012 0435   PROT 7.4 04/07/2012 0435   ALBUMIN 3.7 04/07/2012 0435   AST 22 04/07/2012 0435   ALT 19 04/07/2012 0435   ALKPHOS 79 04/07/2012 0435   BILITOT 0.3 04/07/2012 0435   GFRNONAA >90 04/07/2012 0435   GFRAA >90 04/07/2012 0435    Assessment and Plan  Anemia, iron deficiency Well, pt 's Hb down to 5.6 but she is tolerating it very well so will not tranfuse as planned; MCV 76.3, iron studies in May 2014 were low so will start FeSO4 325 mg BID; recheck in 3 weeks    Hennie Duos, MD

## 2013-04-14 DIAGNOSIS — I69959 Hemiplegia and hemiparesis following unspecified cerebrovascular disease affecting unspecified side: Secondary | ICD-10-CM | POA: Diagnosis not present

## 2013-04-14 DIAGNOSIS — I69998 Other sequelae following unspecified cerebrovascular disease: Secondary | ICD-10-CM | POA: Diagnosis not present

## 2013-04-14 DIAGNOSIS — R279 Unspecified lack of coordination: Secondary | ICD-10-CM | POA: Diagnosis not present

## 2013-04-15 DIAGNOSIS — R279 Unspecified lack of coordination: Secondary | ICD-10-CM | POA: Diagnosis not present

## 2013-04-15 DIAGNOSIS — I69959 Hemiplegia and hemiparesis following unspecified cerebrovascular disease affecting unspecified side: Secondary | ICD-10-CM | POA: Diagnosis not present

## 2013-04-15 DIAGNOSIS — I69998 Other sequelae following unspecified cerebrovascular disease: Secondary | ICD-10-CM | POA: Diagnosis not present

## 2013-04-18 DIAGNOSIS — I69959 Hemiplegia and hemiparesis following unspecified cerebrovascular disease affecting unspecified side: Secondary | ICD-10-CM | POA: Diagnosis not present

## 2013-04-18 DIAGNOSIS — R279 Unspecified lack of coordination: Secondary | ICD-10-CM | POA: Diagnosis not present

## 2013-04-18 DIAGNOSIS — I69998 Other sequelae following unspecified cerebrovascular disease: Secondary | ICD-10-CM | POA: Diagnosis not present

## 2013-04-25 ENCOUNTER — Other Ambulatory Visit: Payer: Self-pay | Admitting: *Deleted

## 2013-04-25 DIAGNOSIS — R52 Pain, unspecified: Secondary | ICD-10-CM

## 2013-04-25 MED ORDER — TRAMADOL HCL 50 MG PO TABS: 100.0000 mg | ORAL_TABLET | Freq: Three times a day (TID) | ORAL | Status: DC | PRN

## 2013-04-25 NOTE — Telephone Encounter (Signed)
[facility] 

## 2013-04-26 ENCOUNTER — Non-Acute Institutional Stay (SKILLED_NURSING_FACILITY): Payer: Medicare Other | Admitting: Nurse Practitioner

## 2013-04-26 DIAGNOSIS — R350 Frequency of micturition: Secondary | ICD-10-CM | POA: Diagnosis not present

## 2013-04-26 DIAGNOSIS — E1149 Type 2 diabetes mellitus with other diabetic neurological complication: Secondary | ICD-10-CM

## 2013-04-26 DIAGNOSIS — I639 Cerebral infarction, unspecified: Secondary | ICD-10-CM

## 2013-04-26 DIAGNOSIS — D509 Iron deficiency anemia, unspecified: Secondary | ICD-10-CM

## 2013-04-26 DIAGNOSIS — G4700 Insomnia, unspecified: Secondary | ICD-10-CM | POA: Insufficient documentation

## 2013-04-26 DIAGNOSIS — G8110 Spastic hemiplegia affecting unspecified side: Secondary | ICD-10-CM

## 2013-04-26 DIAGNOSIS — I6350 Cerebral infarction due to unspecified occlusion or stenosis of unspecified cerebral artery: Secondary | ICD-10-CM

## 2013-04-26 DIAGNOSIS — K5900 Constipation, unspecified: Secondary | ICD-10-CM

## 2013-04-26 DIAGNOSIS — I10 Essential (primary) hypertension: Secondary | ICD-10-CM | POA: Diagnosis not present

## 2013-04-26 NOTE — Progress Notes (Signed)
Patient ID: Chelsey Gallagher, female   DOB: 1950-03-11, 63 y.o.   MRN: [id number]    Nursing Home Location:  [place] [facility]: [facility] (31)  PCP: Estill Dooms, MD  Allergies  Allergen Reactions  . Codeine Other (See Comments)    Per Mar  . Flexeril [Cyclobenzaprine Hcl] Other (See Comments)    Per Mar  . Lyrica [Pregabalin] Other (See Comments)    "wires my up"    Chief Complaint  Patient presents with  . Medical Managment of Chronic Issues    HPI:  63 year old female with a pmh of CVA with spastic hemiplegia, constipation, anxiety, DM, HTN, and hyperlipidemia is being seen today for routine follow up on chronic conditions Reassessment of ongoing issues:  Anemia- CBC is worse but pt asymptomatic, started iron back twice daily- tolerating well except fear of worsening constipation   Constipation- doing "okay" reports constipation is freq pt now taking miralax as scheduled;on senakot S BID  Anxiety with depression- stable on cymbalta 60 mg daily  DM- improved; taking metformin 1000 mg BID and lantus 12 units qhs  Hyperlipidemia- tolerating pravastatin without side effects  Spastic hemiplegia-pt is on gabapentin and baclofen -- is worse, she is unable to sleep at night due to the pain Right foot pain being evaluated by ortho- found to have 3 fx via MRI now wearing boot, pain is controlled   Review of Systems:  Review of Systems  Constitutional: Negative for malaise/fatigue.  HENT: Negative for congestion and sore throat.   Eyes: Negative for blurred vision.  Respiratory: Negative for cough, sputum production and shortness of breath.   Cardiovascular: Negative for chest pain, palpitations and leg swelling.  Gastrointestinal: Positive for constipation. Negative for heartburn, abdominal pain and diarrhea.  Genitourinary: Positive for frequency. Negative for dysuria and urgency.  Musculoskeletal: Positive for myalgias (legs).  Skin: Negative.     Neurological: Positive for dizziness (occasionally). Negative for tingling, sensory change, speech change, weakness and headaches.  Psychiatric/Behavioral: Positive for depression. The patient has insomnia.      Past Medical History  Diagnosis Date  . Diabetes mellitus   . Hypertension   . Depression   . PAD (peripheral artery disease)     S/p bypass grafting, unclear exactly where  . Nephrolithiasis   . Hyperlipidemia 05/14/2011  . Hearing loss   . Complication of anesthesia     doesn't wake up good- "usually end up in ICU"  . PONV (postoperative nausea and vomiting)   . Orthostatic hypotension     after surgery.  Marland Kitchen Heart murmur     PCP- [facility]- No tx needed.  . Stroke 05/2011    Weakness on Right. Wears leg brace.  . Foot drop, left   . H/O hiatal hernia     second one  . Neuromuscular disorder     Diabetic neuropathy- has inproved since she quit smoking.  . Arthritis    Past Surgical History  Procedure Laterality Date  . Total hip arthroplasty  2008    Right  . Abdominal hysterectomy  2003  . Cesarean section  1984  . Cholecystectomy  1984  . Bypass graft  2003    Abdominal aortic  . Kidney stone surgery  1999  . Appendectomy  2011  . Hernia repair  2011  . Lithotripsy    . Abdominal hysterectomy     Social History:   reports that she has quit smoking. Her  smoking use included Cigarettes. She smoked 0.00 packs per day for 45 years. She quit smokeless tobacco use about 1 years ago. She reports that she does not drink alcohol or use illicit drugs.  Family History  Problem Relation Age of Onset  . Cancer Maternal Aunt     breast    Medications: Patient's Medications  New Prescriptions   No medications on file  Previous Medications   ACETAMINOPHEN (TYLENOL) 325 MG TABLET    Take 650 mg by mouth every 6 (six) hours as needed for pain.   ASPIRIN EC 81 MG TABLET    Take 81 mg by mouth daily.   BACLOFEN (LIORESAL) 10 MG TABLET    Take 5 mg by  mouth 2 (two) times daily. 5 mg at 8a and 2p and 20 mg at hs   BISACODYL (DULCOLAX) 10 MG SUPPOSITORY    Place 10 mg rectally as needed for constipation.   CHOLECALCIFEROL (VITAMIN D) 1000 UNITS TABLET    Take 1,000 Units by mouth daily.   CLOPIDOGREL (PLAVIX) 75 MG TABLET    Take 75 mg by mouth daily.   DULOXETINE (CYMBALTA) 30 MG CAPSULE    Take 60 mg by mouth daily.   FERROUS SULFATE 325 (65 FE) MG TABLET    Take 325 mg by mouth 2 (two) times daily with a meal.   GABAPENTIN (NEURONTIN) 300 MG CAPSULE    Take 300 mg by mouth at bedtime.   INSULIN GLARGINE (LANTUS) 100 UNIT/ML INJECTION    Inject 10 Units into the skin at bedtime.    LISINOPRIL (PRINIVIL,ZESTRIL) 20 MG TABLET    Take 10 mg by mouth 2 (two) times daily.    MAGNESIUM HYDROXIDE (MILK OF MAGNESIA) 800 MG/5ML SUSPENSION    Take by mouth daily as needed for constipation. 30cc   METFORMIN (GLUCOPHAGE) 500 MG TABLET    Take 1,000 mg by mouth 2 (two) times daily with a meal.    MIDODRINE (PROAMATINE) 5 MG TABLET    Take 5 mg by mouth 3 (three) times daily.   POLYETHYLENE GLYCOL (MIRALAX / GLYCOLAX) PACKET    Take 17 g by mouth daily.   PRAVASTATIN (PRAVACHOL) 40 MG TABLET    Take 40 mg by mouth daily.    SENNOSIDES-DOCUSATE SODIUM (SENOKOT-S) 8.6-50 MG TABLET    Take 1 tablet by mouth 2 (two) times daily.   TRAMADOL (ULTRAM) 50 MG TABLET    Take 2 tablets (100 mg total) by mouth every 8 (eight) hours as needed.  Modified Medications   No medications on file  Discontinued Medications   No medications on file     Physical Exam:  Filed Vitals:   04/26/13 1600  BP: 145/79  Pulse: 86  Temp: 98.5 F (36.9 C)  Resp: 20  Physical Exam  Vitals reviewed. Constitutional: She is oriented to person, place, and time and well-developed, well-nourished, and in no distress. No distress.  HENT:  Head: Normocephalic and atraumatic.  Right Ear: External ear normal.  Left Ear: External ear normal.  Nose: Nose normal.  Mouth/Throat:  Oropharynx is clear and moist.  Eyes: Conjunctivae and EOM are normal. Pupils are equal, round, and reactive to light.  Neck: Normal range of motion. Neck supple.  Cardiovascular: Normal rate and regular rhythm.   Murmur heard. Pulmonary/Chest: Effort normal and breath sounds normal. No respiratory distress. She has no wheezes.  Abdominal: Soft. Bowel sounds are normal. She exhibits no distension. There is no tenderness. There is no rebound and  no guarding.  Musculoskeletal: She exhibits no edema and no tenderness.  Neurological: She is alert and oriented to person, place, and time.  Right sided hemiparesis   Skin: Skin is warm and dry. She is not diaphoretic.  Psychiatric: Affect normal.       Labs reviewed: CBC with Diff  Result: 03/17/2013 2:46 PM ( Status: F )  WBC 5.6 4.0-10.5 K/uL SLN  RBC 2.87 L 3.87-5.11 MIL/uL SLN  Hemoglobin 7.4 L 12.0-15.0 g/dL SLN  Hematocrit 23.7 L 36.0-46.0 % SLN  MCV 82.6 78.0-100.0 fL SLN  MCH 25.8 L 26.0-34.0 pg SLN  MCHC 31.2 30.0-36.0 g/dL SLN  RDW 15.6 H 11.5-15.5 % SLN  Platelet Count 253 150-400 K/uL SLN  Granulocyte % 74 43-77 % SLN  Absolute Gran 4.1 1.7-7.7 K/uL SLN  Lymph % 16 12-46 % SLN  Absolute Lymph 0.9 0.7-4.0 K/uL SLN  Mono % 9 3-12 % SLN  Absolute Mono 0.5 0.1-1.0 K/uL SLN  Eos % 1 0-5 % SLN  Absolute Eos 0.0 0.0-0.7 K/uL SLN  Baso % 0 0-1 % SLN  Absolute Baso 0.0 0.0-0.1 K/uL SLN  Smear Review Criteria for review not met SLN  Basic Metabolic Panel  Result: 08/28/3149 4:16 PM ( Status: F )  Sodium 131 L 135-145 mEq/L SLN  Potassium 4.7 3.5-5.3 mEq/L SLN  Chloride 100 96-112 mEq/L SLN  CO2 20 19-32 mEq/L SLN  Glucose 130 H 70-99 mg/dL SLN  BUN 19 6-23 mg/dL SLN  Creatinine 0.79 0.50-1.10 mg/dL SLN  Calcium 9.5   Assessment/Plan 1.  Urinary frequency -with frequent UTIs will send off for this, if normal may start her on medication for OAB  2. Unspecified constipation -will stop scheduled miralax and make this  PRN -will start Linzess 145 mcg daily for better control over constipation  3. Type II or unspecified type diabetes mellitus with neurological manifestations, uncontrolled(250.62) -improved blood sugars on review; will get A1c on next blood draw  4. Spastic hemiplegia affecting dominant side -pain is worse, will increase baclofen at night to 40 mg if this is not effective will reduce back to 20 and try different medication but pt unable to sleep due to pain in right side   5. CVA (cerebral infarction) Worsening pain in right leg, see 4; otherwise stable, conts on ASA and plavix  6. Anemia, iron deficiency Severe;  Hb down to 5.6; pt without symptoms, therefore she was started on iron BID; will increase to TID at this time, follow up CBC scheduled, question source, will have staff hemoccult stools times 3   7. Insomnia -hopefully with better control of pain this will improve

## 2013-05-05 DIAGNOSIS — Z09 Encounter for follow-up examination after completed treatment for conditions other than malignant neoplasm: Secondary | ICD-10-CM | POA: Diagnosis not present

## 2013-05-05 DIAGNOSIS — S8290XD Unspecified fracture of unspecified lower leg, subsequent encounter for closed fracture with routine healing: Secondary | ICD-10-CM | POA: Diagnosis not present

## 2013-05-07 ENCOUNTER — Encounter (HOSPITAL_COMMUNITY): Payer: Self-pay | Admitting: Emergency Medicine

## 2013-05-07 ENCOUNTER — Emergency Department (HOSPITAL_COMMUNITY): Payer: Medicare Other

## 2013-05-07 ENCOUNTER — Observation Stay (HOSPITAL_COMMUNITY)
Admission: EM | Admit: 2013-05-07 | Discharge: 2013-05-10 | Disposition: A | Discharge status: Skilled Nursing Facility | Payer: Medicare Other | Attending: Internal Medicine | Admitting: Internal Medicine

## 2013-05-07 DIAGNOSIS — R4182 Altered mental status, unspecified: Secondary | ICD-10-CM | POA: Diagnosis present

## 2013-05-07 DIAGNOSIS — F3289 Other specified depressive episodes: Secondary | ICD-10-CM | POA: Insufficient documentation

## 2013-05-07 DIAGNOSIS — H9190 Unspecified hearing loss, unspecified ear: Secondary | ICD-10-CM | POA: Diagnosis not present

## 2013-05-07 DIAGNOSIS — Z87891 Personal history of nicotine dependence: Secondary | ICD-10-CM | POA: Diagnosis not present

## 2013-05-07 DIAGNOSIS — E872 Acidosis, unspecified: Secondary | ICD-10-CM | POA: Diagnosis not present

## 2013-05-07 DIAGNOSIS — R809 Proteinuria, unspecified: Secondary | ICD-10-CM | POA: Diagnosis not present

## 2013-05-07 DIAGNOSIS — R079 Chest pain, unspecified: Secondary | ICD-10-CM | POA: Diagnosis not present

## 2013-05-07 DIAGNOSIS — I70209 Unspecified atherosclerosis of native arteries of extremities, unspecified extremity: Secondary | ICD-10-CM | POA: Diagnosis not present

## 2013-05-07 DIAGNOSIS — R195 Other fecal abnormalities: Secondary | ICD-10-CM | POA: Diagnosis not present

## 2013-05-07 DIAGNOSIS — R5381 Other malaise: Secondary | ICD-10-CM | POA: Diagnosis not present

## 2013-05-07 DIAGNOSIS — I69959 Hemiplegia and hemiparesis following unspecified cerebrovascular disease affecting unspecified side: Secondary | ICD-10-CM | POA: Insufficient documentation

## 2013-05-07 DIAGNOSIS — I639 Cerebral infarction, unspecified: Secondary | ICD-10-CM | POA: Diagnosis present

## 2013-05-07 DIAGNOSIS — M129 Arthropathy, unspecified: Secondary | ICD-10-CM | POA: Diagnosis not present

## 2013-05-07 DIAGNOSIS — K222 Esophageal obstruction: Secondary | ICD-10-CM | POA: Diagnosis not present

## 2013-05-07 DIAGNOSIS — K449 Diaphragmatic hernia without obstruction or gangrene: Secondary | ICD-10-CM | POA: Diagnosis not present

## 2013-05-07 DIAGNOSIS — K922 Gastrointestinal hemorrhage, unspecified: Secondary | ICD-10-CM

## 2013-05-07 DIAGNOSIS — I951 Orthostatic hypotension: Secondary | ICD-10-CM | POA: Diagnosis not present

## 2013-05-07 DIAGNOSIS — Z7902 Long term (current) use of antithrombotics/antiplatelets: Secondary | ICD-10-CM | POA: Diagnosis not present

## 2013-05-07 DIAGNOSIS — E1149 Type 2 diabetes mellitus with other diabetic neurological complication: Secondary | ICD-10-CM | POA: Diagnosis not present

## 2013-05-07 DIAGNOSIS — D509 Iron deficiency anemia, unspecified: Secondary | ICD-10-CM

## 2013-05-07 DIAGNOSIS — K5900 Constipation, unspecified: Secondary | ICD-10-CM | POA: Diagnosis present

## 2013-05-07 DIAGNOSIS — I10 Essential (primary) hypertension: Secondary | ICD-10-CM | POA: Diagnosis not present

## 2013-05-07 DIAGNOSIS — E785 Hyperlipidemia, unspecified: Secondary | ICD-10-CM | POA: Diagnosis not present

## 2013-05-07 DIAGNOSIS — Z87442 Personal history of urinary calculi: Secondary | ICD-10-CM | POA: Insufficient documentation

## 2013-05-07 DIAGNOSIS — R0902 Hypoxemia: Secondary | ICD-10-CM | POA: Diagnosis not present

## 2013-05-07 DIAGNOSIS — E871 Hypo-osmolality and hyponatremia: Secondary | ICD-10-CM | POA: Diagnosis not present

## 2013-05-07 DIAGNOSIS — R32 Unspecified urinary incontinence: Secondary | ICD-10-CM | POA: Insufficient documentation

## 2013-05-07 DIAGNOSIS — E1142 Type 2 diabetes mellitus with diabetic polyneuropathy: Secondary | ICD-10-CM | POA: Diagnosis not present

## 2013-05-07 DIAGNOSIS — F329 Major depressive disorder, single episode, unspecified: Secondary | ICD-10-CM | POA: Insufficient documentation

## 2013-05-07 DIAGNOSIS — F411 Generalized anxiety disorder: Secondary | ICD-10-CM | POA: Insufficient documentation

## 2013-05-07 DIAGNOSIS — I959 Hypotension, unspecified: Secondary | ICD-10-CM | POA: Diagnosis present

## 2013-05-07 DIAGNOSIS — R011 Cardiac murmur, unspecified: Secondary | ICD-10-CM | POA: Diagnosis not present

## 2013-05-07 DIAGNOSIS — Z794 Long term (current) use of insulin: Secondary | ICD-10-CM | POA: Insufficient documentation

## 2013-05-07 DIAGNOSIS — Z7982 Long term (current) use of aspirin: Secondary | ICD-10-CM | POA: Insufficient documentation

## 2013-05-07 DIAGNOSIS — D649 Anemia, unspecified: Secondary | ICD-10-CM | POA: Insufficient documentation

## 2013-05-07 DIAGNOSIS — R5383 Other fatigue: Secondary | ICD-10-CM | POA: Diagnosis not present

## 2013-05-07 HISTORY — DX: Type 2 diabetes mellitus with diabetic neuropathy, unspecified: E11.40

## 2013-05-07 LAB — CBC WITH DIFFERENTIAL/PLATELET
Basophils Absolute: 0 10*3/uL (ref 0.0–0.1)
Basophils Relative: 0 % (ref 0–1)
EOS PCT: 1 % (ref 0–5)
Eosinophils Absolute: 0.1 10*3/uL (ref 0.0–0.7)
HCT: 27.9 % — ABNORMAL LOW (ref 36.0–46.0)
HEMOGLOBIN: 8.6 g/dL — AB (ref 12.0–15.0)
LYMPHS PCT: 25 % (ref 12–46)
Lymphs Abs: 2 10*3/uL (ref 0.7–4.0)
MCH: 26.7 pg (ref 26.0–34.0)
MCHC: 30.8 g/dL (ref 30.0–36.0)
MCV: 86.6 fL (ref 78.0–100.0)
MONOS PCT: 8 % (ref 3–12)
Monocytes Absolute: 0.6 10*3/uL (ref 0.1–1.0)
NEUTROS PCT: 66 % (ref 43–77)
Neutro Abs: 5.4 10*3/uL (ref 1.7–7.7)
Platelets: 253 10*3/uL (ref 150–400)
RBC: 3.22 MIL/uL — AB (ref 3.87–5.11)
RDW: 22.9 % — ABNORMAL HIGH (ref 11.5–15.5)
WBC: 8.1 10*3/uL (ref 4.0–10.5)

## 2013-05-07 LAB — TROPONIN I

## 2013-05-07 LAB — URINE MICROSCOPIC-ADD ON

## 2013-05-07 LAB — COMPREHENSIVE METABOLIC PANEL
ALT: 20 U/L (ref 0–35)
AST: 32 U/L (ref 0–37)
Albumin: 3.3 g/dL — ABNORMAL LOW (ref 3.5–5.2)
Alkaline Phosphatase: 58 U/L (ref 39–117)
BUN: 20 mg/dL (ref 6–23)
CO2: 23 meq/L (ref 19–32)
Calcium: 9.8 mg/dL (ref 8.4–10.5)
Chloride: 94 mEq/L — ABNORMAL LOW (ref 96–112)
Creatinine, Ser: 0.82 mg/dL (ref 0.50–1.10)
GFR calc non Af Amer: 76 mL/min — ABNORMAL LOW (ref >90)
GFR, EST AFRICAN AMERICAN: 88 mL/min — AB (ref >90)
GLUCOSE: 167 mg/dL — AB (ref 70–99)
POTASSIUM: 4.6 meq/L (ref 3.7–5.3)
Sodium: 132 mEq/L — ABNORMAL LOW (ref 137–147)
Total Bilirubin: 0.2 mg/dL — ABNORMAL LOW (ref 0.3–1.2)
Total Protein: 6.6 g/dL (ref 6.0–8.3)

## 2013-05-07 LAB — URINALYSIS, ROUTINE W REFLEX MICROSCOPIC
BILIRUBIN URINE: NEGATIVE
Glucose, UA: NEGATIVE mg/dL
Hgb urine dipstick: NEGATIVE
KETONES UR: NEGATIVE mg/dL
Leukocytes, UA: NEGATIVE
NITRITE: NEGATIVE
Protein, ur: 100 mg/dL — AB
Specific Gravity, Urine: 1.022 (ref 1.005–1.030)
Urobilinogen, UA: 1 mg/dL (ref 0.0–1.0)
pH: 5 (ref 5.0–8.0)

## 2013-05-07 LAB — CBG MONITORING, ED: GLUCOSE-CAPILLARY: 137 mg/dL — AB (ref 70–99)

## 2013-05-07 LAB — I-STAT CG4 LACTIC ACID, ED: Lactic Acid, Venous: 3.37 mmol/L — ABNORMAL HIGH (ref 0.5–2.2)

## 2013-05-07 MED ORDER — SODIUM CHLORIDE 0.9 % IV BOLUS (SEPSIS)
1000.0000 mL | Freq: Once | INTRAVENOUS | Status: AC
  Administered 2013-05-08: 1000 mL via INTRAVENOUS

## 2013-05-07 MED ORDER — SODIUM CHLORIDE 0.9 % IV BOLUS (SEPSIS)
1000.0000 mL | Freq: Once | INTRAVENOUS | Status: AC
  Administered 2013-05-07: 1000 mL via INTRAVENOUS

## 2013-05-07 MED ORDER — SODIUM CHLORIDE 0.9 % IV SOLN
INTRAVENOUS | Status: DC
Start: 2013-05-07 — End: 2013-05-10
  Administered 2013-05-07: 23:00:00 via INTRAVENOUS
  Administered 2013-05-08: 50 mL via INTRAVENOUS
  Administered 2013-05-09 – 2013-05-10 (×2): via INTRAVENOUS

## 2013-05-07 NOTE — ED Provider Notes (Signed)
CSN: [id number]     Arrival date & time 05/07/13  2212 History   First MD Initiated Contact with Patient 05/07/13 2233     Chief Complaint  Patient presents with  . Hypotension     (Consider location/radiation/quality/duration/timing/severity/associated sxs/prior Treatment) HPI 63 yo female presents to ED via EMS for [facility] with Hypotension. Patient alert to voice and noxious stimuli. Patient not oriented. Patient denies CP, SOB, fever, Abdominal pain, urinary frequency, dysuria, HA. Admits to dizziness.  Spoke with Jacquenette Shone at [facility] who informs me that patient has been normal all day and last seen normal at 9:00pm this evening. Prior to calling EMS patient was reported to be calling out for help and stating that she "cant' see" and is "dizzy". Patient BP taken at that time and noted to be 84/48. Patient became more unresponsive with noted "abdominal breathing". Awakens with arousal. Denies any new medications, or changes in bowel or urinary habits. Denies bloody stools. Patient at baseline is wheel chair bound. Has Right sided weakness secondary to past CVA. Patient is incontinent of bladder but continent of bowels. Patient reported to be independent with minimal assist for transfer and bathing/bathroom.   Past Medical History  Diagnosis Date  . Diabetes mellitus   . Hypertension   . Depression   . PAD (peripheral artery disease)     S/p bypass grafting, unclear exactly where  . Nephrolithiasis   . Hyperlipidemia 05/14/2011  . Hearing loss   . Complication of anesthesia     doesn't wake up good- "usually end up in ICU"  . PONV (postoperative nausea and vomiting)   . Orthostatic hypotension     after surgery.  Marland Kitchen Heart murmur     PCP- [facility]- No tx needed.  . Stroke 05/2011    Weakness on Right. Wears leg brace.  . Foot drop, left   . H/O hiatal hernia     second one  . Neuromuscular disorder     Diabetic neuropathy- has inproved since she quit smoking.  .  Arthritis    Past Surgical History  Procedure Laterality Date  . Total hip arthroplasty  2008    Right  . Abdominal hysterectomy  2003  . Cesarean section  1984  . Cholecystectomy  1984  . Bypass graft  2003    Abdominal aortic  . Kidney stone surgery  1999  . Appendectomy  2011  . Hernia repair  2011  . Lithotripsy    . Abdominal hysterectomy     Family History  Problem Relation Age of Onset  . Cancer Maternal Aunt     breast   History  Substance Use Topics  . Smoking status: Former Smoker -- 0.00 packs/day for 45 years    Types: Cigarettes  . Smokeless tobacco: Former Systems developer    Quit date: 05/08/2011  . Alcohol Use: No   OB History   Grav Para Term Preterm Abortions TAB SAB Ect Mult Living                 Review of Systems  Unable to perform ROS: Mental status change      Allergies  Codeine; Flexeril; and Lyrica  Home Medications   Current Outpatient Rx  Name  Route  Sig  Dispense  Refill  . acetaminophen (TYLENOL) 325 MG tablet   Oral   Take 650 mg by mouth every 6 (six) hours as needed for pain.         Marland Kitchen aspirin EC 81  MG tablet   Oral   Take 81 mg by mouth daily.         . baclofen (LIORESAL) 10 MG tablet   Oral   Take 5 mg by mouth 2 (two) times daily. 5 mg at 8a and 4pm         . baclofen (LIORESAL) 20 MG tablet   Oral   Take 20 mg by mouth at bedtime.         . cholecalciferol (VITAMIN D) 1000 UNITS tablet   Oral   Take 1,000 Units by mouth daily.         . clopidogrel (PLAVIX) 75 MG tablet   Oral   Take 75 mg by mouth daily.         . DULoxetine (CYMBALTA) 30 MG capsule   Oral   Take 60 mg by mouth daily.         . ferrous sulfate 325 (65 FE) MG tablet   Oral   Take 325 mg by mouth 3 (three) times daily.          Marland Kitchen gabapentin (NEURONTIN) 300 MG capsule   Oral   Take 300 mg by mouth at bedtime.         . insulin glargine (LANTUS) 100 UNIT/ML injection   Subcutaneous   Inject 12 Units into the skin at bedtime.           . Linaclotide (LINZESS) 145 MCG CAPS capsule   Oral   Take 145 mcg by mouth daily.         . magnesium hydroxide (MILK OF MAGNESIA) 800 MG/5ML suspension   Oral   Take 30 mLs by mouth daily as needed for constipation. 30cc         . Menthol, Topical Analgesic, (BIOFREEZE) 4 % GEL   Apply externally   Apply 1 application topically every 6 (six) hours as needed (pain).         Marland Kitchen menthol-cetylpyridinium (CEPACOL) 3 MG lozenge   Oral   Take 1 lozenge by mouth every 4 (four) hours as needed for sore throat.         . metFORMIN (GLUCOPHAGE) 500 MG tablet   Oral   Take 1,000 mg by mouth 2 (two) times daily with a meal.          . midodrine (PROAMATINE) 5 MG tablet   Oral   Take 5 mg by mouth 3 (three) times daily.         . polyethylene glycol (MIRALAX / GLYCOLAX) packet   Oral   Take 17 g by mouth daily as needed for mild constipation.          . pravastatin (PRAVACHOL) 40 MG tablet   Oral   Take 40 mg by mouth daily.          . sennosides-docusate sodium (SENOKOT-S) 8.6-50 MG tablet   Oral   Take 1 tablet by mouth 2 (two) times daily.         . traMADol (ULTRAM-ER) 100 MG 24 hr tablet   Oral   Take 100 mg by mouth every 8 (eight) hours as needed for pain.         . Wheat Dextrin (BENEFIBER) POWD   Oral   Take 1 Applicatorful by mouth daily. Mix with 8oz of fluid          BP 100/60  Pulse 81  Temp(Src) 97.3 F (36.3 C) (Axillary)  Resp 20  SpO2 100% Physical Exam  Nursing note and vitals reviewed. Constitutional: She appears well-developed and well-nourished. No distress.  HENT:  Head: Normocephalic and atraumatic.  Mouth/Throat: Uvula is midline. Mucous membranes are dry.  Eyes: Conjunctivae and EOM are normal. Pupils are equal, round, and reactive to light.  Neck: No JVD present. Carotid bruit is present (bilateral). No tracheal deviation present.  Cardiovascular: Normal rate and regular rhythm.  Exam reveals no gallop and no  friction rub.   No murmur heard. Pulmonary/Chest: Effort normal. No respiratory distress. She has no wheezes. She has no rhonchi. She has rales in the left lower field.  Abdominal: Soft. Bowel sounds are normal. She exhibits distension. There is no hepatosplenomegaly. There is generalized tenderness. There is no rigidity, no rebound and no guarding.  Multiple scars of abdomen noted, consistent with hx of multiple abdominal surgeries as listed in above in hx.   Abdomen Tympanic to percussion.   Genitourinary: Rectal exam shows no external hemorrhoid (multiple skin tags noted, consistent with old hemorrhoids.), no internal hemorrhoid, no fissure, no mass and no tenderness. Guaiac positive stool.  Musculoskeletal: Normal range of motion. She exhibits edema (Bilateral 1+ pitting edema).  Neurological: She is alert. She is disoriented.  Right sided weakness.  Good left sided upper and lower extremity strength.    Skin: Skin is warm and dry. She is not diaphoretic. There is pallor.  Psychiatric: She has a normal mood and affect. Her behavior is normal.    ED Course  Procedures (including critical care time) Labs Review Labs Reviewed  I-STAT CG4 LACTIC ACID, ED - Abnormal; Notable for the following:    Lactic Acid, Venous 3.37 (*)    All other components within normal limits  CBG MONITORING, ED - Abnormal; Notable for the following:    Glucose-Capillary 137 (*)    All other components within normal limits  URINE CULTURE  CULTURE, BLOOD (ROUTINE X 2)  CULTURE, BLOOD (ROUTINE X 2)  CBC WITH DIFFERENTIAL  COMPREHENSIVE METABOLIC PANEL  TROPONIN I  URINALYSIS, ROUTINE W REFLEX MICROSCOPIC   Imaging Review No results found.   EKG Interpretation None      MDM   Final diagnoses:  None   Patient Hypotensive to 80/42. Improved with IV fluids.  CBG 137  Troponin negative  Lactic acid elevated at 3.37  Anemia with hgb of 8.6, appears stable compared to previous Hemoccult positive.  No gross blood on exam.  Hyponatremia at 132, given IV fluids. No Anion gap noted. UA shows proteinuria with hyaline casts. No evidence of UTI.  CXR shows interstitial prominence. Bronchitc vs Congestive.  Urine culture pending.  Blood culture pending.  Patient's orientation appears to be improving.  Patient discussed with Dr. Sharol Given. Concern for possible obstruction.  Plan to get CT abdomen/pelvis. Patient signed over to Dr. Sharol Given at sift change.            Sherrie George, PA-C 05/08/13 [phone]

## 2013-05-07 NOTE — ED Notes (Signed)
Dr Zenia Resides given a copy of lactic acid 3.37

## 2013-05-07 NOTE — ED Notes (Signed)
To room via [facility].  Pt from [facility] for call for HTN.  When EMS got there BP 78/34 HR 76, RA SaO2 80, put on 3LNC sats incrased to 90%.  Pt was reported to be dehydrated, has had decrease in intake "for a while", lethargic.  Facility gave pt Tramadol tonight for unknown pain.

## 2013-05-08 ENCOUNTER — Emergency Department (HOSPITAL_COMMUNITY): Payer: Medicare Other

## 2013-05-08 ENCOUNTER — Encounter (HOSPITAL_COMMUNITY): Payer: Self-pay | Admitting: Radiology

## 2013-05-08 DIAGNOSIS — I959 Hypotension, unspecified: Secondary | ICD-10-CM | POA: Diagnosis present

## 2013-05-08 DIAGNOSIS — R195 Other fecal abnormalities: Secondary | ICD-10-CM | POA: Diagnosis not present

## 2013-05-08 DIAGNOSIS — D509 Iron deficiency anemia, unspecified: Secondary | ICD-10-CM | POA: Diagnosis not present

## 2013-05-08 DIAGNOSIS — I10 Essential (primary) hypertension: Secondary | ICD-10-CM | POA: Diagnosis not present

## 2013-05-08 DIAGNOSIS — I6350 Cerebral infarction due to unspecified occlusion or stenosis of unspecified cerebral artery: Secondary | ICD-10-CM | POA: Diagnosis not present

## 2013-05-08 DIAGNOSIS — R4182 Altered mental status, unspecified: Secondary | ICD-10-CM | POA: Diagnosis present

## 2013-05-08 LAB — GLUCOSE, CAPILLARY
GLUCOSE-CAPILLARY: 140 mg/dL — AB (ref 70–99)
Glucose-Capillary: 84 mg/dL (ref 70–99)
Glucose-Capillary: 101 mg/dL — ABNORMAL HIGH (ref 70–99)

## 2013-05-08 LAB — LACTIC ACID, PLASMA: Lactic Acid, Venous: 1.7 mmol/L (ref 0.5–2.2)

## 2013-05-08 LAB — SAMPLE TO BLOOD BANK

## 2013-05-08 LAB — POC OCCULT BLOOD, ED: FECAL OCCULT BLD: POSITIVE — AB

## 2013-05-08 MED ORDER — FERROUS SULFATE 325 (65 FE) MG PO TABS
325.0000 mg | ORAL_TABLET | Freq: Three times a day (TID) | ORAL | Status: DC
  Administered 2013-05-08 (×3): 325 mg via ORAL
  Filled 2013-05-08 (×6): qty 1

## 2013-05-08 MED ORDER — SENNA-DOCUSATE SODIUM 8.6-50 MG PO TABS: 1.0000 | ORAL_TABLET | Freq: Two times a day (BID) | ORAL | Status: DC

## 2013-05-08 MED ORDER — DULOXETINE HCL 60 MG PO CPEP
60.0000 mg | ORAL_CAPSULE | Freq: Every day | ORAL | Status: DC
  Administered 2013-05-08 – 2013-05-10 (×3): 60 mg via ORAL
  Filled 2013-05-08 (×3): qty 1

## 2013-05-08 MED ORDER — BENEFIBER PO POWD: 1.0000 | Freq: Every day | ORAL | Status: DC

## 2013-05-08 MED ORDER — TRAMADOL HCL ER 100 MG PO TB24: 100.0000 mg | ORAL_TABLET | Freq: Three times a day (TID) | ORAL | Status: DC | PRN

## 2013-05-08 MED ORDER — IOHEXOL 300 MG/ML  SOLN
100.0000 mL | Freq: Once | INTRAMUSCULAR | Status: AC | PRN
  Administered 2013-05-08: 100 mL via INTRAVENOUS

## 2013-05-08 MED ORDER — PSYLLIUM 95 % PO PACK
1.0000 | PACK | Freq: Every day | ORAL | Status: DC
  Administered 2013-05-08 – 2013-05-09 (×2): 1 via ORAL
  Filled 2013-05-08 (×3): qty 1

## 2013-05-08 MED ORDER — CLOPIDOGREL BISULFATE 75 MG PO TABS: 75.0000 mg | ORAL_TABLET | Freq: Every day | ORAL | Status: DC

## 2013-05-08 MED ORDER — SIMVASTATIN 20 MG PO TABS
20.0000 mg | ORAL_TABLET | Freq: Every day | ORAL | Status: DC
  Administered 2013-05-08 – 2013-05-10 (×3): 20 mg via ORAL
  Filled 2013-05-08 (×3): qty 1

## 2013-05-08 MED ORDER — SODIUM CHLORIDE 0.9 % IV SOLN
INTRAVENOUS | Status: DC
  Administered 2013-05-09: 500 mL via INTRAVENOUS

## 2013-05-08 MED ORDER — GABAPENTIN 300 MG PO CAPS
300.0000 mg | ORAL_CAPSULE | Freq: Every day | ORAL | Status: DC
Start: 2013-05-08 — End: 2013-05-10
  Administered 2013-05-08 – 2013-05-09 (×2): 300 mg via ORAL
  Filled 2013-05-08 (×3): qty 1

## 2013-05-08 MED ORDER — TRAMADOL HCL 50 MG PO TABS: 100.0000 mg | ORAL_TABLET | Freq: Three times a day (TID) | ORAL | Status: DC | PRN

## 2013-05-08 MED ORDER — VITAMIN D3 25 MCG (1000 UNIT) PO TABS
1000.0000 [IU] | ORAL_TABLET | Freq: Every day | ORAL | Status: DC
  Administered 2013-05-08 – 2013-05-10 (×3): 1000 [IU] via ORAL
  Filled 2013-05-08 (×3): qty 1

## 2013-05-08 MED ORDER — SODIUM CHLORIDE 0.9 % IJ SOLN
3.0000 mL | Freq: Two times a day (BID) | INTRAMUSCULAR | Status: DC
  Administered 2013-05-08 – 2013-05-10 (×3): 3 mL via INTRAVENOUS

## 2013-05-08 MED ORDER — MIDODRINE HCL 5 MG PO TABS
5.0000 mg | ORAL_TABLET | Freq: Three times a day (TID) | ORAL | Status: DC
  Administered 2013-05-08 – 2013-05-10 (×4): 5 mg via ORAL
  Filled 2013-05-08 (×9): qty 1

## 2013-05-08 MED ORDER — ACETAMINOPHEN 325 MG PO TABS: 650.0000 mg | ORAL_TABLET | Freq: Four times a day (QID) | ORAL | Status: DC | PRN

## 2013-05-08 MED ORDER — MAGNESIUM HYDROXIDE 400 MG/5ML PO SUSP: 30.0000 mL | Freq: Every day | ORAL | Status: DC | PRN

## 2013-05-08 MED ORDER — INSULIN GLARGINE 100 UNIT/ML ~~LOC~~ SOLN
12.0000 [IU] | Freq: Every day | SUBCUTANEOUS | Status: DC
  Filled 2013-05-08 (×3): qty 0.12

## 2013-05-08 MED ORDER — ONDANSETRON HCL 4 MG/2ML IJ SOLN
4.0000 mg | Freq: Four times a day (QID) | INTRAMUSCULAR | Status: DC | PRN
Start: 2013-05-08 — End: 2013-05-10

## 2013-05-08 MED ORDER — CLOPIDOGREL BISULFATE 75 MG PO TABS
75.0000 mg | ORAL_TABLET | Freq: Every day | ORAL | Status: DC
  Administered 2013-05-09 – 2013-05-10 (×2): 75 mg via ORAL
  Filled 2013-05-08 (×2): qty 1

## 2013-05-08 MED ORDER — SENNOSIDES-DOCUSATE SODIUM 8.6-50 MG PO TABS
1.0000 | ORAL_TABLET | Freq: Two times a day (BID) | ORAL | Status: DC
  Administered 2013-05-08 – 2013-05-09 (×4): 1 via ORAL
  Filled 2013-05-08 (×6): qty 1

## 2013-05-08 MED ORDER — ALBUTEROL SULFATE (2.5 MG/3ML) 0.083% IN NEBU: 2.5000 mg | INHALATION_SOLUTION | RESPIRATORY_TRACT | Status: DC | PRN

## 2013-05-08 MED ORDER — POLYETHYLENE GLYCOL 3350 17 G PO PACK
17.0000 g | PACK | Freq: Every day | ORAL | Status: DC | PRN
  Filled 2013-05-08: qty 1

## 2013-05-08 MED ORDER — LINACLOTIDE 145 MCG PO CAPS
145.0000 ug | ORAL_CAPSULE | Freq: Every day | ORAL | Status: DC
  Administered 2013-05-08 – 2013-05-10 (×3): 145 ug via ORAL
  Filled 2013-05-08 (×3): qty 1

## 2013-05-08 MED ORDER — MAGNESIUM HYDROXIDE 800 MG/5ML PO SUSP: 30.0000 mL | Freq: Every day | ORAL | Status: DC | PRN

## 2013-05-08 MED ORDER — INSULIN ASPART 100 UNIT/ML ~~LOC~~ SOLN
0.0000 [IU] | Freq: Three times a day (TID) | SUBCUTANEOUS | Status: DC
  Administered 2013-05-08: 1 [IU] via SUBCUTANEOUS
  Administered 2013-05-10: 3 [IU] via SUBCUTANEOUS

## 2013-05-08 MED ORDER — BACLOFEN 5 MG HALF TABLET
5.0000 mg | ORAL_TABLET | Freq: Two times a day (BID) | ORAL | Status: DC
  Administered 2013-05-08 – 2013-05-10 (×3): 5 mg via ORAL
  Filled 2013-05-08 (×6): qty 1

## 2013-05-08 MED ORDER — BACLOFEN 20 MG PO TABS
20.0000 mg | ORAL_TABLET | Freq: Every day | ORAL | Status: DC
  Administered 2013-05-08 – 2013-05-09 (×2): 20 mg via ORAL
  Filled 2013-05-08 (×4): qty 1

## 2013-05-08 MED ORDER — ASPIRIN EC 81 MG PO TBEC
81.0000 mg | DELAYED_RELEASE_TABLET | Freq: Every day | ORAL | Status: DC
  Administered 2013-05-09 – 2013-05-10 (×2): 81 mg via ORAL
  Filled 2013-05-08 (×2): qty 1

## 2013-05-08 MED ORDER — ASPIRIN EC 81 MG PO TBEC
81.0000 mg | DELAYED_RELEASE_TABLET | Freq: Every day | ORAL | Status: DC
  Filled 2013-05-08: qty 1

## 2013-05-08 MED ORDER — ONDANSETRON HCL 4 MG PO TABS: 4.0000 mg | ORAL_TABLET | Freq: Four times a day (QID) | ORAL | Status: DC | PRN

## 2013-05-08 NOTE — Progress Notes (Signed)
Utilization Review Completed.Donne Anon T3/10/2013

## 2013-05-08 NOTE — Consult Note (Signed)
[facility] Consult: 10:06 AM 05/08/2013  LOS: 1 day    Referring Provider: Dr Algis Liming.  Primary Care Physician:  Estill Dooms, MD Primary Gastroenterologist:  Althia Forts.     Reason for Consultation:  Anemia and FOBT +   HPI: Chelsey Gallagher is a 63 y.o. female.  SNF resident with DM, spastic hemiplegi following 05/2011 CVA with right hemiparesis, ASPVD, s/p abdoominal aortic BPG. On chronic Plavix and low dose ASA.  S/p ventral herniorrhaphy but the hernia has recurred.   Pt admitted from SNF yesterday with AMS, hypotension, hypoxia.  This has improved and today she is alert and fully oriented.  Her hgb was 8.6 and stool was FOBT+.  There has been no rectal bleeding or melena.  No nausea or GI upset.  Pt has chronic constipation and  Daily Linzess, BID senokot and Benefiber, prn miralax, prn MOM on her med list. She still says she only has BMs about 2 x weekly.   Hgb in 2013 16 to 17.  Hgb of 04/2012 8.4 to 11.3. Her Ferritin was 43, iron was < 10 two yrs ago and 29 one year ago in feb 2014.  No indication she was transfused and pt denies transfusion of blood, iron infusions and epogen like med administration. She was started on po Iron which she does not always take due to its worsening her problem with constipation. TID po iron is listed on med list.  Hgb yesterday 8.6 with MCV of 86. There is mention of Hgb of 5.6 in notes but I reviewed labs and do not find this value.   Had colonoscopy over 5 years ago, screening unremarkable study done in [place].  Had remote ulcers on EGD (non bleeding), there were not ulcers on EGD ~ 1986 (done at time of cholecystectomy). She has hemorrhoidal tags but no bleeding or irritation from these.  Denies straining.  No dysphagia or abdominal pain.  No weight fluctuation.    Mostly wheelchair mobility but can use walker.     Past Medical History  Diagnosis Date  . Diabetes mellitus   . Hypertension   . Depression   . PAD (peripheral artery disease)     S/p bypass grafting, unclear exactly where  . Nephrolithiasis   . Hyperlipidemia 05/14/2011  . Hearing loss   . Complication of anesthesia     doesn't wake up good- "usually end up in ICU"  . PONV (postoperative nausea and vomiting)   . Orthostatic hypotension     after surgery.  Marland Kitchen Heart murmur     PCP- [facility]- No tx needed.  . Stroke 05/2011    Weakness on Right. Wears leg brace.  . Foot drop, left   . H/O hiatal hernia     second one  . Neuromuscular disorder     Diabetic neuropathy- has inproved since she quit smoking.  . Arthritis     Past Surgical History  Procedure Laterality Date  . Total hip arthroplasty  2008    Right  . Abdominal hysterectomy  2003  .  Cesarean section  1984  . Cholecystectomy  1984  . Bypass graft  2003    Abdominal aortic  . Kidney stone surgery  1999  . Appendectomy  2011  . Hernia repair  2011  . Lithotripsy    . Abdominal hysterectomy      Prior to Admission medications   Medication Sig Start Date End Date Taking? Authorizing Provider  acetaminophen (TYLENOL) 325 MG tablet Take 650 mg by mouth every 6 (six) hours as needed for pain.   Yes Historical Provider, MD  aspirin EC 81 MG tablet Take 81 mg by mouth daily.   Yes Historical Provider, MD  baclofen (LIORESAL) 10 MG tablet Take 5 mg by mouth 2 (two) times daily. 5 mg at 8a and 4pm   Yes Historical Provider, MD  baclofen (LIORESAL) 20 MG tablet Take 20 mg by mouth at bedtime.   Yes Historical Provider, MD  cholecalciferol (VITAMIN D) 1000 UNITS tablet Take 1,000 Units by mouth daily.   Yes Historical Provider, MD  clopidogrel (PLAVIX) 75 MG tablet Take 75 mg by mouth daily.   Yes Historical Provider, MD  DULoxetine (CYMBALTA) 30 MG capsule Take 60 mg by mouth daily. 10/27/12  Yes Pricilla Larsson, NP  ferrous sulfate 325 (65 FE) MG tablet Take 325 mg by mouth 3 (three) times daily.    Yes Historical Provider, MD  gabapentin (NEURONTIN) 300 MG capsule Take 300 mg by mouth at bedtime.   Yes Historical Provider, MD  insulin glargine (LANTUS) 100 UNIT/ML injection Inject 12 Units into the skin at bedtime.    Yes Historical Provider, MD  Linaclotide Rolan Lipa) 145 MCG CAPS capsule Take 145 mcg by mouth daily.   Yes Historical Provider, MD  magnesium hydroxide (MILK OF MAGNESIA) 800 MG/5ML suspension Take 30 mLs by mouth daily as needed for constipation. 22cc   Yes Historical Provider, MD  Menthol, Topical Analgesic, (BIOFREEZE) 4 % GEL Apply 1 application topically every 6 (six) hours as needed (pain).   Yes Historical Provider, MD  menthol-cetylpyridinium (CEPACOL) 3 MG lozenge Take 1 lozenge by mouth every 4 (four) hours as needed for sore throat.   Yes Historical Provider, MD  metFORMIN (GLUCOPHAGE) 500 MG tablet Take 1,000 mg by mouth 2 (two) times daily with a meal.    Yes Historical Provider, MD  midodrine (PROAMATINE) 5 MG tablet Take 5 mg by mouth 3 (three) times daily.   Yes Historical Provider, MD  polyethylene glycol (MIRALAX / GLYCOLAX) packet Take 17 g by mouth daily as needed for mild constipation.    Yes Historical Provider, MD  pravastatin (PRAVACHOL) 40 MG tablet Take 40 mg by mouth daily.  04/11/11  Yes Historical Provider, MD  sennosides-docusate sodium (SENOKOT-S) 8.6-50 MG tablet Take 1 tablet by mouth 2 (two) times daily.   Yes Historical Provider, MD  traMADol (ULTRAM-ER) 100 MG 24 hr tablet Take 100 mg by mouth every 8 (eight) hours as needed for pain.   Yes Historical Provider, MD  Wheat Dextrin (BENEFIBER) POWD Take 1 Applicatorful by mouth daily. Mix with 8oz of fluid   Yes Historical Provider, MD    Scheduled Meds: . [START ON 05/09/2013] aspirin EC  81 mg Oral Daily  . baclofen  20 mg Oral QHS  . baclofen  5 mg Oral BID AC  . cholecalciferol  1,000 Units Oral  Daily  . [START ON 05/09/2013] clopidogrel  75 mg Oral Daily  . DULoxetine  60 mg Oral Daily  . ferrous sulfate  325 mg Oral TID  . gabapentin  300 mg Oral QHS  . insulin aspart  0-9 Units Subcutaneous TID WC  . insulin glargine  12 Units Subcutaneous QHS  . Linaclotide  145 mcg Oral Daily  . midodrine  5 mg Oral TID WC  . psyllium  1 packet Oral Daily  . senna-docusate  1 tablet Oral BID  . simvastatin  20 mg Oral q1800  . sodium chloride  3 mL Intravenous Q12H   Infusions: . sodium chloride 125 mL/hr at 05/07/13 2325   PRN Meds: acetaminophen, albuterol, magnesium hydroxide, ondansetron (ZOFRAN) IV, ondansetron, polyethylene glycol, traMADol   Allergies as of 05/07/2013 - Review Complete 05/07/2013  Allergen Reaction Noted  . Codeine Other (See Comments) 05/08/2011  . Flexeril [cyclobenzaprine hcl] Other (See Comments) 05/08/2011  . Lyrica [pregabalin] Other (See Comments) 08/29/2011    Family History  Problem Relation Age of Onset  . Cancer Maternal Aunt     breast    History   Social History  . Marital Status: Divorced    Spouse Name: N/A    Number of Children: N/A  . Years of Education: N/A   Occupational History  . Not on file.   Social History Main Topics  . Smoking status: Former Smoker -- 0.00 packs/day for 45 years    Types: Cigarettes  . Smokeless tobacco: Former Systems developer    Quit date: 05/08/2011  . Alcohol Use: No  . Drug Use: No  . Sexual Activity: No   Other Topics Concern  . Not on file   Social History Narrative   Lives by herself but still close to family. Not very active, does housework.     REVIEW OF SYSTEMS: Constitutional:  No weakness ENT:  No nose bleeds Pulm:  Tested + for flu and treated with Tamiflu at SNF in late Jan 2015. CV:  No palpitations, no LE edema.  GU:  No hematuria, no frequency.  + incontinence.  Treated with Augmentin for UTI in Jan 2015.  GI:  Per HPI Heme:  Per HPI   Transfusions:  None per pt recall Neuro:  No  headaches, no peripheral tingling or numbness Derm:  No itching, no rash or sores.  Endocrine:  No sweats or chills.  No polyuria or dysuria Immunization:  Flu shot, but + flu treated with tamiflu jan 2015.  Travel:  None beyond local counties in last few months.    PHYSICAL EXAM: Vital signs in last 24 hours: Filed Vitals:   05/08/13 0841  BP: 129/65  Pulse: 90  Temp:   Resp:    Wt Readings from Last 3 Encounters:  05/08/13 70.308 kg (155 lb)  12/21/12 67.586 kg (149 lb)  11/25/12 68.04 kg (150 lb)   General: pleasant, pale, alert, comfortable Head:  No assymetry  Eyes:  No icterus, no pallor, EOMI Ears:  Not HOH  Nose:  No congestion, no discharge Mouth:  Clear, moist, fair dentition Neck:  No mass, no JVD Lungs:  Clear bil,  No dyspnea or cough Heart: RRR with 2 to 3/6 systolic murmeer Abdomen:  Soft, NT, ventral hernia, long mid-line scar.   Rectal: dark brown FOBT + stool, soft fecal impaction, external hemorrhoidal tags but no other masses   Musc/Skeltl: contracured right UE.   Extremities:  No palmar erythema,  Neurologic:  Oriented x 3.  No tremor.   Skin:  No rash, no sores, no telangectasia  Tattoos:  On left ankle Nodes:  No cervical adenopathy  Psych:  Pleasant, cooperative, good historian.  Relaxed.   Intake/Output from previous day: 03/07 0701 - 03/08 0700 In: -  Out: 900 [Urine:900] Intake/Output this shift: Total I/O In: -  Out: 700 [Urine:700]  LAB RESULTS:  Recent Labs  05/07/13 2223  WBC 8.1  HGB 8.6*  HCT 27.9*  PLT 253   BMET Lab Results  Component Value Date   NA 132* 05/07/2013   NA 135 04/07/2012   NA 135 04/06/2012   K 4.6 05/07/2013   K 3.7 04/07/2012   K 4.3 04/06/2012   CL 94* 05/07/2013   CL 100 04/07/2012   CL 104 04/06/2012   CO2 23 05/07/2013   CO2 26 04/07/2012   CO2 25 04/06/2012   GLUCOSE 167* 05/07/2013   GLUCOSE 120* 04/07/2012   GLUCOSE 92 04/06/2012   BUN 20 05/07/2013   BUN 18 04/07/2012   BUN 28* 04/06/2012   CREATININE 0.82  05/07/2013   CREATININE 0.62 04/07/2012   CREATININE 0.84 04/06/2012   CALCIUM 9.8 05/07/2013   CALCIUM 9.8 04/07/2012   CALCIUM 9.0 04/06/2012   LFT  Recent Labs  05/07/13 2223  PROT 6.6  ALBUMIN 3.3*  AST 32  ALT 20  ALKPHOS 58  BILITOT 0.2*   PT/INR Lab Results  Component Value Date   INR 1.00 08/29/2011   INR 0.91 05/08/2011   Hepatitis Panel No results found for this basename: HEPBSAG, HCVAB, HEPAIGM, HEPBIGM,  in the last 72 hours C-Diff No components found with this basename: cdiff   Lipase  No results found for this basename: lipase    Drugs of Abuse  No results found for this basename: labopia, cocainscrnur, labbenz, amphetmu, thcu, labbarb     RADIOLOGY STUDIES: Ct Abdomen Pelvis W Contrast 05/08/2013   COMPARISON:  05/12/2011  FINDINGS: BODY WALL: Ventral hernia/diastasis rectus status post probable mesh repair. There is wide necked recurrence, containing nonobstructed bowel.  LOWER CHEST: Extensive coronary atherosclerosis. Mild atelectasis and trace effusions dependently.  ABDOMEN/PELVIS:  Liver: No focal abnormality.  Biliary: Cholecystectomy.  Pancreas: Unremarkable.  Spleen: Unremarkable.  Adrenals: Globally thickened left adrenal gland, stable from prior.  Kidneys and ureters: In general multiple bilateral renal hypodensities appear stable in appearance. A lesion from the anterior lower pole left kidney which had typical appearance of cyst previously, has mildly decreased in size and increase in density, most consistent with interval hemorrhage. The 5 mm stone in the lower pole left kidney. 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  Bladder: Largely obscured by right hip prosthesis. There is a Foley catheter with incomplete drainage of the bladder.  Reproductive: Hysterectomy.  Probable oophorectomies.  Bowel: No obstruction. The rectal wall appears mildly thickened, but is underdistended. Appendectomy changes.  Retroperitoneum: No mass or adenopathy.   Peritoneum: No free fluid or gas.  Vascular: Aortobifemoral bypass. The graft is widely patent. There is no aneurysm. The imaged proximal superficial femoral arteries have extensive atherosclerosis with stenosis.  OSSEOUS: There is a total right hip arthroplasty. No acute fracture.  IMPRESSION: 1. No acute intra-abdominal findings. 2. Chronic findings are stable from 2013 and noted above.   Electronically Signed   By: Jorje Guild M.D.   On: 05/08/2013 04:19   Dg Chest Port 1 View  05/07/2013   CLINICAL DATA:  Chest pain  EXAM: PORTABLE CHEST - 1 VIEW  COMPARISON:  04/05/2012  FINDINGS: Chronic cardiomegaly. Mediastinal contours distorted by rightward rotation. Diffuse interstitial coarsening, accentuated by low lung volumes. No effusion or pneumothorax.  Cholecystectomy changes.  IMPRESSION: Interstitial prominence which could be bronchitic or congestive.   Electronically Signed   By: Jorje Guild M.D.   On: 05/07/2013 23:15    ENDOSCOPIC STUDIES: Colonoscopy in [place]   EGD remotely in [place]:   *  AMS due to hypotension and hypoxia, resolved. MD feels she was dehydrated. U/A, CXR and WBCs negative.   *  Anemia, normocytic.  Chronic. Started Iron and Hgb went from 5.6 to 8.6. No transfusion thus far   *  FOBT +, no overt GI bleeding  *  Chronic Plavix and ASA  *  Constipation, chronic.  On multiple meds including Linzess.   *  ASPVD.  No note of mesenteric vasc disease on the CT scan. S/p Aortobifem graft.   *  IDDM  *  Thrombocytopenia, noted in Feb 2014, low as 120s, now resolved one year later.     PLAN:     *  Needs EGD and colonoscopy.  The attending MD is holding Plavix/ASA for today and will hold longer depending on GI recommendations.  Think it would be easier to do EGD and colon in hospital vs prep at SNF and return to [facility] vs hospital late next week for procedure.    Azucena Freed  05/08/2013, 10:06 AM Pager: [phone]  GI ATTENDING  History,  laboratories, x-rays reviewed. Patient personally seen and examined. Agree with H&P as outlined above. Patient with Multiple significant medical problems. Admitted with altered mental status and low blood pressure. Improved. Noted to be anemic. Chronic. Hemoccult-positive stool. Reports of colonoscopy elsewhere about 5 years ago. Not substantiated. Also reports of peptic ulcer disease. No evidence for acute bleeding. At this point, since she is not acutely bleeding, you should resume aspirin and Plavix given her prior history of stroke. We will set her up for diagnostic upper endoscopy, with Dr. Olevia Perches, to see if we might find some etiology for her heme positive stool and anemia, and rule out any significant pathology. It would be helpful to obtain outside colonoscopy report, if possible. Patient has no idea who performed the exam or exactly when.  Chelsey Gallagher. Geri Seminole., M.D. [facility] Division of Gastroenterology

## 2013-05-08 NOTE — ED Provider Notes (Signed)
Medical screening examination/treatment/procedure(s) were conducted as a shared visit with non-physician practitioner(s) and myself.  I personally evaluated the patient during the encounter.   EKG Interpretation   Date/Time:  Saturday May 07 2013 23:30:13 EST Ventricular Rate:  73 PR Interval:  183 QRS Duration: 74 QT Interval:  441 QTC Calculation: 486 R Axis:   51 Text Interpretation:  Sinus rhythm Probable left atrial enlargement  Probable LVH with secondary repol abnrm Borderline prolonged QT interval  Baseline wander Otherwise no significant change Confirmed by Magdelena Kinsella  MD,  Aniah Pauli ([id number]) on 05/07/2013 11:37:05 PM      Results for orders placed during the hospital encounter of 05/07/13  CBC WITH DIFFERENTIAL      Result Value Ref Range   WBC 8.1  4.0 - 10.5 K/uL   RBC 3.22 (*) 3.87 - 5.11 MIL/uL   Hemoglobin 8.6 (*) 12.0 - 15.0 g/dL   HCT 27.9 (*) 36.0 - 46.0 %   MCV 86.6  78.0 - 100.0 fL   MCH 26.7  26.0 - 34.0 pg   MCHC 30.8  30.0 - 36.0 g/dL   RDW 22.9 (*) 11.5 - 15.5 %   Platelets 253  150 - 400 K/uL   Neutrophils Relative % 66  43 - 77 %   Lymphocytes Relative 25  12 - 46 %   Monocytes Relative 8  3 - 12 %   Eosinophils Relative 1  0 - 5 %   Basophils Relative 0  0 - 1 %   Neutro Abs 5.4  1.7 - 7.7 K/uL   Lymphs Abs 2.0  0.7 - 4.0 K/uL   Monocytes Absolute 0.6  0.1 - 1.0 K/uL   Eosinophils Absolute 0.1  0.0 - 0.7 K/uL   Basophils Absolute 0.0  0.0 - 0.1 K/uL  COMPREHENSIVE METABOLIC PANEL      Result Value Ref Range   Sodium 132 (*) 137 - 147 mEq/L   Potassium 4.6  3.7 - 5.3 mEq/L   Chloride 94 (*) 96 - 112 mEq/L   CO2 23  19 - 32 mEq/L   Glucose, Bld 167 (*) 70 - 99 mg/dL   BUN 20  6 - 23 mg/dL   Creatinine, Ser 0.82  0.50 - 1.10 mg/dL   Calcium 9.8  8.4 - 10.5 mg/dL   Total Protein 6.6  6.0 - 8.3 g/dL   Albumin 3.3 (*) 3.5 - 5.2 g/dL   AST 32  0 - 37 U/L   ALT 20  0 - 35 U/L   Alkaline Phosphatase 58  39 - 117 U/L   Total Bilirubin 0.2 (*) 0.3 - 1.2  mg/dL   GFR calc non Af Amer 76 (*) >90 mL/min   GFR calc Af Amer 88 (*) >90 mL/min  TROPONIN I      Result Value Ref Range   Troponin I <0.30  <0.30 ng/mL  URINALYSIS, ROUTINE W REFLEX MICROSCOPIC      Result Value Ref Range   Color, Urine YELLOW  YELLOW   APPearance CLOUDY (*) CLEAR   Specific Gravity, Urine 1.022  1.005 - 1.030   pH 5.0  5.0 - 8.0   Glucose, UA NEGATIVE  NEGATIVE mg/dL   Hgb urine dipstick NEGATIVE  NEGATIVE   Bilirubin Urine NEGATIVE  NEGATIVE   Ketones, ur NEGATIVE  NEGATIVE mg/dL   Protein, ur 100 (*) NEGATIVE mg/dL   Urobilinogen, UA 1.0  0.0 - 1.0 mg/dL   Nitrite NEGATIVE  NEGATIVE   Leukocytes, UA  NEGATIVE  NEGATIVE  URINE MICROSCOPIC-ADD ON      Result Value Ref Range   Squamous Epithelial / LPF FEW (*) RARE   WBC, UA 0-2  <3 WBC/hpf   Bacteria, UA RARE  RARE   Casts HYALINE CASTS (*) NEGATIVE   Urine-Other AMORPHOUS URATES/PHOSPHATES    I-STAT CG4 LACTIC ACID, ED      Result Value Ref Range   Lactic Acid, Venous 3.37 (*) 0.5 - 2.2 mmol/L  CBG MONITORING, ED      Result Value Ref Range   Glucose-Capillary 137 (*) 70 - 99 mg/dL  POC OCCULT BLOOD, ED      Result Value Ref Range   Fecal Occult Bld POSITIVE (*) NEGATIVE  SAMPLE TO BLOOD BANK      Result Value Ref Range   Blood Bank Specimen SAMPLE AVAILABLE FOR TESTING     Sample Expiration 05/09/2013     Ct Abdomen Pelvis W Contrast  05/08/2013   CLINICAL DATA:  Hypertension  EXAM: CT ABDOMEN AND PELVIS WITH CONTRAST  TECHNIQUE: Multidetector CT imaging of the abdomen and pelvis was performed using the standard protocol following bolus administration of intravenous contrast.  CONTRAST:  143mL OMNIPAQUE IOHEXOL 300 MG/ML  SOLN  COMPARISON:  05/12/2011  FINDINGS: BODY WALL: Ventral hernia/diastasis rectus status post probable mesh repair. There is wide necked recurrence, containing nonobstructed bowel.  LOWER CHEST: Extensive coronary atherosclerosis. Mild atelectasis and trace effusions dependently.   ABDOMEN/PELVIS:  Liver: No focal abnormality.  Biliary: Cholecystectomy.  Pancreas: Unremarkable.  Spleen: Unremarkable.  Adrenals: Globally thickened left adrenal gland, stable from prior.  Kidneys and ureters: In general multiple bilateral renal hypodensities appear stable in appearance. A lesion from the anterior lower pole left kidney which had typical appearance of cyst previously, has mildly decreased in size and increase in density, most consistent with interval hemorrhage. The 5 mm stone in the lower pole left kidney. 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  Bladder: Largely obscured by right hip prosthesis. There is a Foley catheter with incomplete drainage of the bladder.  Reproductive: Hysterectomy.  Probable oophorectomies.  Bowel: No obstruction. The rectal wall appears mildly thickened, but is underdistended. Appendectomy changes.  Retroperitoneum: No mass or adenopathy.  Peritoneum: No free fluid or gas.  Vascular: Aortobifemoral bypass. The graft is widely patent. There is no aneurysm. The imaged proximal superficial femoral arteries have extensive atherosclerosis with stenosis.  OSSEOUS: There is a total right hip arthroplasty. No acute fracture.  IMPRESSION: 1. No acute intra-abdominal findings. 2. Chronic findings are stable from 2013 and noted above.   Electronically Signed   By: Jorje Guild M.D.   On: 05/08/2013 04:19   Dg Chest Port 1 View  05/07/2013   CLINICAL DATA:  Chest pain  EXAM: PORTABLE CHEST - 1 VIEW  COMPARISON:  04/05/2012  FINDINGS: Chronic cardiomegaly. Mediastinal contours distorted by rightward rotation. Diffuse interstitial coarsening, accentuated by low lung volumes. No effusion or pneumothorax. Cholecystectomy changes.  IMPRESSION: Interstitial prominence which could be bronchitic or congestive.   Electronically Signed   By: Jorje Guild M.D.   On: 05/07/2013 63:70      63 year old female from nursing home with hypertensive, hypoxia, altered  mental status.  Patient with chronic anemia, per notes, noted to be heme positive on rectal sample.  CT scan without signs of diverticulitis.  Patient's abdomen is distended and painful.  Hypotension has responded to fluid boluses.  Given hypotension, plan for admission to the hospital  Sharlett Iles  Sharol Given, MD 05/08/13 [phone]

## 2013-05-08 NOTE — H&P (Addendum)
History and Physical  Chelsey Gallagher VVO:[id number] DOB: Jul 15, 1950 DOA: 05/07/2013  Referring physician: EDP PCP: Chelsey Dooms, MD  Outpatient Specialists:  1. Neurology: Dr. Suzzanne Gallagher.  Chief Complaint: Hypotension, hypoxia and altered mental status  HPI: Chelsey Gallagher is a 63 y.o. 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 Currently patient is asymptomatic and is not sure why she was sent to the hospital. She states that she did have some dizziness last night while she was sitting on her wheelchair but denies passing out, chest pain or palpitations. She complains of dry mouth and some lower abdominal discomfort. She denies headache, earache, sore throat, cough, dyspnea, chest pain, nausea, vomiting, diarrhea, dysuria, urinary frequencies or skin rash. Her last BM was 2 days ago. She states that the stools are usually dark from iron supplements. Last colonoscopy approximately 4 years ago in [place] apparently normal. Remote history of EGD-unclear findings. In the ED, initial blood pressure of 80/42 mmHg which improved after 3 L of IV fluid boluses, sodium 132, glucose 167, hemoglobin 8.6, troponin x1 negative, CT abdomen and pelvis with contrast without acute findings, chest x-ray showing interstitial prominence which could be bronchitic or congestive and FOBT +. Lactate was elevated at 3.37. UA negative for features of UTI. Hospitalist admission requested.   Review of Systems: All systems reviewed and apart from history of presenting illness, are negative.  Past Medical History  Diagnosis Date  . Diabetes mellitus   . Hypertension   . Depression   . PAD (peripheral artery disease)     S/p bypass grafting,  unclear exactly where  . Nephrolithiasis   . Hyperlipidemia 05/14/2011  . Hearing loss   . Complication of anesthesia     doesn't wake up good- "usually end up in ICU"  . PONV (postoperative nausea and vomiting)   . Orthostatic hypotension     after surgery.  Marland Kitchen Heart murmur     PCP- [facility]- No tx needed.  . Stroke 05/2011    Weakness on Right. Wears leg brace.  . Foot drop, left   . H/O hiatal hernia     second one  . Neuromuscular disorder     Diabetic neuropathy- has inproved since she quit smoking.  . Arthritis    Past Surgical History  Procedure Laterality Date  . Total hip arthroplasty  2008    Right  . Abdominal hysterectomy  2003  . Cesarean section  1984  . Cholecystectomy  1984  . Bypass graft  2003    Abdominal aortic  . Kidney stone surgery  1999  . Appendectomy  2011  . Hernia repair  2011  . Lithotripsy    . Abdominal hysterectomy     Social History:  reports that she has quit smoking. Her smoking use included Cigarettes. She smoked 0.00 packs per day for 45 years. She quit smokeless tobacco use about 2 years ago. She reports that she does not drink alcohol or use illicit drugs.  single. The resident of SNF for the last 2 years. Mobile with the help of an electric wheelchair and walker.  Allergies  Allergen Reactions  . Codeine Other (See Comments)    Per Mar  . Flexeril [Cyclobenzaprine Hcl] Other (See Comments)  Per Mar  . Lyrica [Pregabalin] Other (See Comments)    "wires my up"    Family History  Problem Relation Age of Onset  . Cancer Maternal Aunt     breast    Prior to Admission medications   Medication Sig Start Date End Date Taking? Authorizing Provider  acetaminophen (TYLENOL) 325 MG tablet Take 650 mg by mouth every 6 (six) hours as needed for pain.   Yes Historical Provider, MD  aspirin EC 81 MG tablet Take 81 mg by mouth daily.   Yes Historical Provider, MD  baclofen (LIORESAL) 10 MG tablet Take 5 mg by mouth 2 (two)  times daily. 5 mg at 8a and 4pm   Yes Historical Provider, MD  baclofen (LIORESAL) 20 MG tablet Take 20 mg by mouth at bedtime.   Yes Historical Provider, MD  cholecalciferol (VITAMIN D) 1000 UNITS tablet Take 1,000 Units by mouth daily.   Yes Historical Provider, MD  clopidogrel (PLAVIX) 75 MG tablet Take 75 mg by mouth daily.   Yes Historical Provider, MD  DULoxetine (CYMBALTA) 30 MG capsule Take 60 mg by mouth daily. 10/27/12  Yes Pricilla Larsson, NP  ferrous sulfate 325 (65 FE) MG tablet Take 325 mg by mouth 3 (three) times daily.    Yes Historical Provider, MD  gabapentin (NEURONTIN) 300 MG capsule Take 300 mg by mouth at bedtime.   Yes Historical Provider, MD  insulin glargine (LANTUS) 100 UNIT/ML injection Inject 12 Units into the skin at bedtime.    Yes Historical Provider, MD  Linaclotide Rolan Lipa) 145 MCG CAPS capsule Take 145 mcg by mouth daily.   Yes Historical Provider, MD  magnesium hydroxide (MILK OF MAGNESIA) 800 MG/5ML suspension Take 30 mLs by mouth daily as needed for constipation. 51cc   Yes Historical Provider, MD  Menthol, Topical Analgesic, (BIOFREEZE) 4 % GEL Apply 1 application topically every 6 (six) hours as needed (pain).   Yes Historical Provider, MD  menthol-cetylpyridinium (CEPACOL) 3 MG lozenge Take 1 lozenge by mouth every 4 (four) hours as needed for sore throat.   Yes Historical Provider, MD  metFORMIN (GLUCOPHAGE) 500 MG tablet Take 1,000 mg by mouth 2 (two) times daily with a meal.    Yes Historical Provider, MD  midodrine (PROAMATINE) 5 MG tablet Take 5 mg by mouth 3 (three) times daily.   Yes Historical Provider, MD  polyethylene glycol (MIRALAX / GLYCOLAX) packet Take 17 g by mouth daily as needed for mild constipation.    Yes Historical Provider, MD  pravastatin (PRAVACHOL) 40 MG tablet Take 40 mg by mouth daily.  04/11/11  Yes Historical Provider, MD  sennosides-docusate sodium (SENOKOT-S) 8.6-50 MG tablet Take 1 tablet by mouth 2 (two) times daily.   Yes  Historical Provider, MD  traMADol (ULTRAM-ER) 100 MG 24 hr tablet Take 100 mg by mouth every 8 (eight) hours as needed for pain.   Yes Historical Provider, MD  Wheat Dextrin (BENEFIBER) POWD Take 1 Applicatorful by mouth daily. Mix with 8oz of fluid   Yes Historical Provider, MD   Physical Exam: Filed Vitals:   05/08/13 0445 05/08/13 0600 05/08/13 0707 05/08/13 0841  BP: 122/58 113/55 99/54 129/65  Pulse: 95 95 89 90  Temp:   98 F (36.7 C)   TempSrc:      Resp:  24 20   Height:   5\' 5"  (1.651 m)   Weight:   70.308 kg (155 lb)   SpO2: 95% 99% 97%  General exam: Moderately built and nourished  female patient, lying comfortably supine in bed in no obvious distress.  Head, eyes and ENT: Nontraumatic and normocephalic. Pupils equally reacting to light and accommodation. Oral mucosa  slightly dry.  Neck: Supple. No JVD, carotid bruit or thyromegaly.  Lymphatics: No lymphadenopathy.  Respiratory system: Clear to auscultation. No increased work of breathing.  Cardiovascular system: S1 and S2 heard, RRR. No JVD, murmurs, gallops, clicks or pedal edema.  Gastrointestinal system: Abdomen is nondistended, soft and nontender. Normal bowel sounds heard. No organomegaly or masses appreciated. Midline laparotomy scar extending from suprapubic region and epigastric region and possible ventral/incisional uncomplicated hernia   Central nervous system: Alert and oriented. No cranial nerve deficits.  Extremities: 5 x 5 power in left limbs.  Grade 4 x 5 power in right limbs which are spastic Peripheral pulses symmetrically felt.   Skin: No rashes or acute findings.  Musculoskeletal system: Negative exam.  Psychiatry: Pleasant and cooperative.   Labs on Admission:  Basic Metabolic Panel:  Recent Labs Lab 05/07/13 2223  NA 132*  K 4.6  CL 94*  CO2 23  GLUCOSE 167*  BUN 20  CREATININE 0.82  CALCIUM 9.8   Liver Function Tests:  Recent Labs Lab 05/07/13 2223  AST 32  ALT 20   ALKPHOS 58  BILITOT 0.2*  PROT 6.6  ALBUMIN 3.3*   No results found for this basename: LIPASE, AMYLASE,  in the last 168 hours No results found for this basename: AMMONIA,  in the last 168 hours CBC:  Recent Labs Lab 05/07/13 2223  WBC 8.1  NEUTROABS 5.4  HGB 8.6*  HCT 27.9*  MCV 86.6  PLT 253   Cardiac Enzymes:  Recent Labs Lab 05/07/13 2227  TROPONINI <0.30    BNP (last 3 results) No results found for this basename: PROBNP,  in the last 8760 hours CBG:  Recent Labs Lab 05/07/13 2233  GLUCAP 137*    Radiological Exams on Admission: Ct Abdomen Pelvis W Contrast  05/08/2013   CLINICAL DATA:  Hypertension  EXAM: CT ABDOMEN AND PELVIS WITH CONTRAST  TECHNIQUE: Multidetector CT imaging of the abdomen and pelvis was performed using the standard protocol following bolus administration of intravenous contrast.  CONTRAST:  118mL OMNIPAQUE IOHEXOL 300 MG/ML  SOLN  COMPARISON:  05/12/2011  FINDINGS: BODY WALL: Ventral hernia/diastasis rectus status post probable mesh repair. There is wide necked recurrence, containing nonobstructed bowel.  LOWER CHEST: Extensive coronary atherosclerosis. Mild atelectasis and trace effusions dependently.  ABDOMEN/PELVIS:  Liver: No focal abnormality.  Biliary: Cholecystectomy.  Pancreas: Unremarkable.  Spleen: Unremarkable.  Adrenals: Globally thickened left adrenal gland, stable from prior.  Kidneys and ureters: In general multiple bilateral renal hypodensities appear stable in appearance. A lesion from the anterior lower pole left kidney which had typical appearance of cyst previously, has mildly decreased in size and increase in density, most consistent with interval hemorrhage. The 5 mm stone in the lower pole left kidney. 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  Bladder: Largely obscured by right hip prosthesis. There is a Foley catheter with incomplete drainage of the bladder.  Reproductive: Hysterectomy.  Probable oophorectomies.   Bowel: No obstruction. The rectal wall appears mildly thickened, but is underdistended. Appendectomy changes.  Retroperitoneum: No mass or adenopathy.  Peritoneum: No free fluid or gas.  Vascular: Aortobifemoral bypass. The graft is widely patent. There is no aneurysm. The imaged proximal superficial femoral arteries have extensive atherosclerosis with stenosis.  OSSEOUS: There is a total  right hip arthroplasty. No acute fracture.  IMPRESSION: 1. No acute intra-abdominal findings. 2. Chronic findings are stable from 2013 and noted above.   Electronically Signed   By: Jorje Guild M.D.   On: 05/08/2013 04:19   Dg Chest Port 1 View  05/07/2013   CLINICAL DATA:  Chest pain  EXAM: PORTABLE CHEST - 1 VIEW  COMPARISON:  04/05/2012  FINDINGS: Chronic cardiomegaly. Mediastinal contours distorted by rightward rotation. Diffuse interstitial coarsening, accentuated by low lung volumes. No effusion or pneumothorax. Cholecystectomy changes.  IMPRESSION: Interstitial prominence which could be bronchitic or congestive.   Electronically Signed   By: Jorje Guild M.D.   On: 05/07/2013 23:15    EKG: Independently reviewed.  sinus rhythm, normal axis, LVH with repolarization abnormalities and no acute changes.   Assessment/Plan Active Problems:   CVA (cerebral infarction)   Hypertension   Type II or unspecified type diabetes mellitus with neurological manifestations, uncontrolled(250.62)   Hyperlipidemia   Orthostatic hypotension   Unspecified constipation   Hypotension   Fecal occult blood test positive   Altered mental status   1. Hypotension: Possibly secondary to dehydration from decreased oral intake. Patient not on diuretics oral antihypertensives. She does have a history of orthostatic hypotension for which she takes midodrine. Improved after IV fluid resuscitation. Reduce IV fluids and monitor.  2. Altered mental status: Unclear etiology.? Related to problem #1. No reported syncope. No new focal  deficits. Patient currently is fully coherent. No metabolic or infectious etiology appreciated. Monitor.  3.  FOBT +/Chronic anemia: As per records, patient's hemoglobin was 5.6 on 04/26/13 suggesting improvement since then. Her baseline hemoglobin is probably in the 8-9 g per DL range. She is on dual antiplatelet agents for her stroke. Consider GI consultation for colonoscopy either in or outpatient. Follow CBCs. Abdominal discomfort of unclear etiology. CT abdomen negative. 4. Lactic acidosis: Probably related to hypotension and poor perfusion. Hydrated with IV fluids. Follow lactic acid levels. 5. Hypoxia: unclear etiology. Titrate O2. CXR without acute findings. 6. Type II DM with peripheral neuropathy and PAD: Continue home dose of Lantus. Add SSI. Hold metformin for 48 hours postcontrast. 7. Constipation: Continue bowel regimen. 8. History of hyperlipidemia: Continue statins. 9. History of CVA with residual right hemiplegia: Continue aspirin, Plavix    Code Status: DO NOT RESUSCITATE-confirmed with patient after extensive discussion.  Family Communication:  none at bedside.   Disposition Plan: Return to SNF when medically stable, possibly 3/9 if no further inpatient workup planned and CBC stable.    Time spent: 32 minutes  Maevis Mumby, MD, FACP, FHM. [facility] Pager [phone]  If 7PM-7AM, please contact night-coverage [email or web address] Password [facility] 05/08/2013, 8:48 AM

## 2013-05-08 NOTE — ED Notes (Signed)
Patient transported to CT 

## 2013-05-08 NOTE — Progress Notes (Signed)
Patient's daughter, Janett Billow, would like to be updated by MD in am: [phone] (H) [phone] (C) Daughter states that the patient does not tolerate "heavy anesthesia" well.  States her mother has experienced severe hypotension and spent time in ICU after anesthesia in the past.  Jodell Cipro

## 2013-05-09 ENCOUNTER — Encounter (HOSPITAL_COMMUNITY)
Admission: EM | Disposition: A | Discharge status: Skilled Nursing Facility | Payer: Self-pay | Source: Home / Self Care | Attending: Internal Medicine

## 2013-05-09 DIAGNOSIS — E1149 Type 2 diabetes mellitus with other diabetic neurological complication: Secondary | ICD-10-CM | POA: Diagnosis not present

## 2013-05-09 DIAGNOSIS — R195 Other fecal abnormalities: Secondary | ICD-10-CM | POA: Diagnosis not present

## 2013-05-09 DIAGNOSIS — D509 Iron deficiency anemia, unspecified: Secondary | ICD-10-CM | POA: Diagnosis not present

## 2013-05-09 DIAGNOSIS — I959 Hypotension, unspecified: Secondary | ICD-10-CM | POA: Diagnosis not present

## 2013-05-09 HISTORY — PX: ESOPHAGOGASTRODUODENOSCOPY: SHX5428

## 2013-05-09 LAB — URINE CULTURE
CULTURE: NO GROWTH
Colony Count: NO GROWTH

## 2013-05-09 LAB — BASIC METABOLIC PANEL
BUN: 11 mg/dL (ref 6–23)
CO2: 23 mEq/L (ref 19–32)
Calcium: 10 mg/dL (ref 8.4–10.5)
Chloride: 104 mEq/L (ref 96–112)
Creatinine, Ser: 0.63 mg/dL (ref 0.50–1.10)
Glucose, Bld: 136 mg/dL — ABNORMAL HIGH (ref 70–99)
POTASSIUM: 4.3 meq/L (ref 3.7–5.3)
Sodium: 140 mEq/L (ref 137–147)

## 2013-05-09 LAB — CBC
HEMATOCRIT: 31.7 % — AB (ref 36.0–46.0)
Hemoglobin: 9.6 g/dL — ABNORMAL LOW (ref 12.0–15.0)
MCH: 26.1 pg (ref 26.0–34.0)
MCHC: 30.3 g/dL (ref 30.0–36.0)
MCV: 86.1 fL (ref 78.0–100.0)
PLATELETS: 247 10*3/uL (ref 150–400)
RBC: 3.68 MIL/uL — ABNORMAL LOW (ref 3.87–5.11)
RDW: 22.3 % — AB (ref 11.5–15.5)
WBC: 6 10*3/uL (ref 4.0–10.5)

## 2013-05-09 LAB — GLUCOSE, CAPILLARY
GLUCOSE-CAPILLARY: 126 mg/dL — AB (ref 70–99)
GLUCOSE-CAPILLARY: 117 mg/dL — AB (ref 70–99)
Glucose-Capillary: 116 mg/dL — ABNORMAL HIGH (ref 70–99)
Glucose-Capillary: 110 mg/dL — ABNORMAL HIGH (ref 70–99)

## 2013-05-09 SURGERY — EGD (ESOPHAGOGASTRODUODENOSCOPY)
Anesthesia: Moderate Sedation

## 2013-05-09 MED ORDER — FENTANYL CITRATE 0.05 MG/ML IJ SOLN
INTRAMUSCULAR | Status: DC | PRN
  Administered 2013-05-09: 25 ug via INTRAVENOUS

## 2013-05-09 MED ORDER — BUTAMBEN-TETRACAINE-BENZOCAINE 2-2-14 % EX AERO
INHALATION_SPRAY | CUTANEOUS | Status: DC | PRN
  Administered 2013-05-09: 2 via TOPICAL

## 2013-05-09 MED ORDER — MIDAZOLAM HCL 10 MG/2ML IJ SOLN
INTRAMUSCULAR | Status: DC | PRN
  Administered 2013-05-09: 2 mg via INTRAVENOUS

## 2013-05-09 MED ORDER — SODIUM CHLORIDE 0.9 % IV SOLN
INTRAVENOUS | Status: DC
  Administered 2013-05-10: 500 mL via INTRAVENOUS

## 2013-05-09 MED ORDER — FENTANYL CITRATE 0.05 MG/ML IJ SOLN
INTRAMUSCULAR | Status: AC
  Filled 2013-05-09: qty 2

## 2013-05-09 MED ORDER — PEG-KCL-NACL-NASULF-NA ASC-C 100 G PO SOLR
1.0000 | Freq: Once | ORAL | Status: AC
  Administered 2013-05-09: 200 g via ORAL
  Filled 2013-05-09: qty 1

## 2013-05-09 MED ORDER — MIDAZOLAM HCL 5 MG/ML IJ SOLN
INTRAMUSCULAR | Status: AC
  Filled 2013-05-09: qty 2

## 2013-05-09 MED ORDER — DIPHENHYDRAMINE HCL 50 MG/ML IJ SOLN
INTRAMUSCULAR | Status: AC
  Filled 2013-05-09: qty 1

## 2013-05-09 NOTE — Evaluation (Signed)
Physical Therapy Evaluation Patient Details Name: Chelsey Gallagher MRN: [id number] DOB: 04/30/1950 Today's Date: 05/09/2013 Time: 7829-5621 PT Time Calculation (min): 24 min  PT Assessment / Plan / Recommendation History of Present Illness  63 y.o. 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   Clinical Impression  Pt was adm with the above dx. She presents with R side hemiplegia and expressive aphasia due to a previous CVA. Pt presents similar to baseline function at [facility]. Functional mobility limited to R hemiplegia requiring assistance with sit to stand transfers and self cleaning. Pt to benefit from skilled acute PT to address functional mobility and other deficits listed below    PT Assessment  Patient needs continued PT services    Follow Up Recommendations  SNF    Does the patient have the potential to tolerate intense rehabilitation      Barriers to Discharge        Equipment Recommendations  None recommended by PT (pt has hemiwalker and electric wc at SNF)    Recommendations for Other Services     Frequency Min 2X/week    Precautions / Restrictions Precautions Precautions: Fall Precaution Comments: residual R side hemiplegia Restrictions Weight Bearing Restrictions: No   Pertinent Vitals/Pain Pt reports R foot pain      Mobility  Bed Mobility Overal bed mobility: Needs Assistance Bed Mobility: Rolling;Supine to Sit Rolling: Min assist Supine to sit: Min assist General bed mobility comments: pt required verbal cues for hand placement and safety. Transfers Overall transfer level: Needs assistance Transfers: Stand Pivot Transfers Stand pivot transfers: Mod assist General transfer comment: Pt pivoted on  L foot. Unable to WB on R foot due to pain    Exercises     PT Diagnosis: Difficulty walking;Generalized weakness;Hemiplegia non-dominant side  PT Problem List: Decreased strength;Decreased balance;Decreased mobility;Decreased knowledge of use of DME;Pain PT Treatment Interventions: DME instruction;Gait training;Functional mobility training;Therapeutic activities;Therapeutic exercise;Balance training;Patient/family education     PT Goals(Current goals can be found in the care plan section) Acute Rehab PT Goals Patient Stated Goal: return to rehab facility PT Goal Formulation: With patient Time For Goal Achievement: 05/16/13 Potential to Achieve Goals: Good  Visit Information  Last PT Received On: 05/09/13 Assistance Needed: +1 History of Present Illness: 63 y.o. 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        Prior Functioning  Home Living Family/patient expects to be discharged to:: Skilled nursing facility (pt resident at [facility]) Prior Function Level of Independence: Needs assistance Gait / Transfers Assistance Needed: typically uses an electric WC and a hemiwalker. req assistance for sit to stand transfers ADL's / Homemaking Assistance Needed: Pt req assist to shower chair and cleaning backside and L UE otherwise supervision with bathing. Communication Communication: Expressive difficulties (able to answer all questions) Dominant Hand: Left    Cognition  Cognition Arousal/Alertness: Awake/alert Behavior During Therapy: WFL for tasks assessed/performed Overall Cognitive Status: Within Functional Limits for tasks assessed    Extremity/Trunk Assessment Upper Extremity Assessment Upper Extremity Assessment: RUE  deficits/detail RUE Deficits / Details: pt with  residual spastic hemiplegia from previous stroke RUE Sensation:  (light touch sensation intact ) Lower Extremity Assessment Lower Extremity Assessment: LLE deficits/detail LLE Deficits / Details: pt with residual spastic hemiplegia from previous stroke LLE Sensation:  (light touch sensation intact )   Balance Balance Overall balance assessment: Needs assistance Sitting-balance support: Single extremity supported Sitting balance-Leahy Scale: Poor Standing balance support: Single extremity supported Standing balance-Leahy Scale: Poor Standing balance comment: unable to bear weight in R LE due top R foot pain  End of Session PT - End of Session Equipment Utilized During Treatment: Gait belt Activity Tolerance: Patient tolerated treatment well Patient left: in chair;with call bell/phone within reach Nurse Communication: Mobility status  GP Functional Assessment Tool Used: clinical judgement Functional Limitation: Mobility: Walking and moving around Mobility: Walking and Moving Around Current Status ([id number]): At least 60 percent but less than 80 percent impaired, limited or restricted Mobility: Walking and Moving Around Goal Status [phone]): At least 40 percent but less than 60 percent impaired, limited or restricted   Shandell Giovanni SPT 05/09/2013, 10:59 AM

## 2013-05-09 NOTE — Progress Notes (Signed)
PROGRESS NOTE    CIERRAH DACE VOJ:[id number] DOB: 12/16/1950 DOA: 05/07/2013 PCP: Hennie Duos, MD  HPI/Brief narrative 63 y.o. 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 In the ED, initial blood pressure of 80/42 mmHg which improved after 3 L of IV fluid boluses, sodium 132, glucose 167, hemoglobin 8.6, troponin x1 negative, CT abdomen and pelvis with contrast without acute findings, chest x-ray showing interstitial prominence which could be bronchitic or congestive and FOBT +. Lactate was elevated at 3.37. UA negative for features of UTI. Hospitalist admission requested.   Assessment/Plan:  1. Hypotension: Possibly secondary to dehydration from decreased oral intake. Patient not on diuretics or oral antihypertensives. She does have a history of orthostatic hypotension for which she takes midodrine. Resolved after IV fluid resuscitation. DC IV fluids 2. Altered mental status: Unclear etiology.? Related to problem #1. No reported syncope. No new focal deficits. Patient currently is fully coherent. No metabolic or infectious etiology appreciated. Monitor. Resolved. 3. FOBT +/Chronic anemia: As per records, patient's hemoglobin was 5.6 on 04/26/13 suggesting which has since improved. Her baseline hemoglobin is probably in the 8-9 g per DL range. She is on dual antiplatelet agents for her stroke. Abdominal discomfort of unclear etiology. CT abdomen negative. Hemoglobin stable. GI consultation appreciated and plan EGD initially to evaluate. 4. Lactic acidosis: Probably related to hypotension and poor perfusion. Hydrated with IV fluids. Resolved. 5. Hypoxia: unclear etiology. Titrate O2. CXR without acute findings. 6. Type II DM with  peripheral neuropathy and PAD: Continue home dose of Lantus. Add SSI. Hold metformin for 48 hours postcontrast. 7. Constipation: Continue bowel regimen. 8. History of hyperlipidemia: Continue statins. 9. History of CVA with residual right hemiplegia: Continue aspirin, Plavix   Code Status: DO NOT RESUSCITATE Family Communication: Discussed with Diona Fanti, daughter. Disposition Plan: Return to SNF when medically stable   Consultants:  Gastroenterology  Procedures:  None  Antibiotics:  None   Subjective: Patient denies complaints.  Objective: Filed Vitals:   05/08/13 2100 05/09/13 0538 05/09/13 1244 05/09/13 1340  BP: 163/82 149/66 122/59 139/81  Pulse: 103 107 108 93  Temp: 97.9 F (36.6 C) 98.8 F (37.1 C) 98.5 F (36.9 C) 98.2 F (36.8 C)  TempSrc: Oral Oral Oral Oral  Resp: 20 16 18 19   Height:      Weight:      SpO2: 94% 95% 98% 92%    Intake/Output Summary (Last 24 hours) at 05/09/13 1400 Last data filed at 05/09/13 9937  Gross per 24 hour  Intake      0 ml  Output   2900 ml  Net  -2900 ml   Filed Weights   05/08/13 0707  Weight: 70.308 kg (155 lb)     Exam:  General exam: Pleasant middle-aged female lying comfortably in bed Respiratory system: Clear. No increased work of breathing. Cardiovascular system: S1 & S2 heard, RRR. No JVD, murmurs, gallops, clicks or pedal edema. Telemetry: Sinus tachycardia in the 100s. Gastrointestinal system: Abdomen is nondistended, soft and nontender. Normal bowel sounds heard. Central nervous system: Alert and oriented. No cranial nerve deficits. Extremities: 5 x 5 power in left limbs. Grade 4 x 5 power in right limbs which are spastic Peripheral pulses symmetrically felt.  .   Data Reviewed: Basic  Metabolic Panel:  Recent Labs Lab 05/07/13 2223 05/09/13 0815  NA 132* 140  K 4.6 4.3  CL 94* 104  CO2 23 23  GLUCOSE 167* 136*  BUN 20 11  CREATININE 0.82 0.63  CALCIUM 9.8 10.0   Liver Function  Tests:  Recent Labs Lab 05/07/13 2223  AST 32  ALT 20  ALKPHOS 58  BILITOT 0.2*  PROT 6.6  ALBUMIN 3.3*   No results found for this basename: LIPASE, AMYLASE,  in the last 168 hours No results found for this basename: AMMONIA,  in the last 168 hours CBC:  Recent Labs Lab 05/07/13 2223 05/09/13 0815  WBC 8.1 6.0  NEUTROABS 5.4  --   HGB 8.6* 9.6*  HCT 27.9* 31.7*  MCV 86.6 86.1  PLT 253 247   Cardiac Enzymes:  Recent Labs Lab 05/07/13 2227  TROPONINI <0.30   BNP (last 3 results) No results found for this basename: PROBNP,  in the last 8760 hours CBG:  Recent Labs Lab 05/08/13 1220 05/08/13 1636 05/08/13 2120 05/09/13 0817 05/09/13 1155  GLUCAP 140* 84 101* 126* 117*    Recent Results (from the past 240 hour(s))  CULTURE, BLOOD (ROUTINE X 2)     Status: None   Collection Time    05/07/13 10:21 PM      Result Value Ref Range Status   Specimen Description BLOOD LEFT ARM   Final   Special Requests BOTTLES DRAWN AEROBIC AND ANAEROBIC 10CC EACH   Final   Culture  Setup Time     Final   Value: 05/08/2013 13:09     Performed at [facility]   Culture     Final   Value:        BLOOD CULTURE RECEIVED NO GROWTH TO DATE CULTURE WILL BE HELD FOR 5 DAYS BEFORE ISSUING A FINAL NEGATIVE REPORT     Performed at [facility]   Report Status PENDING   Incomplete  CULTURE, BLOOD (ROUTINE X 2)     Status: None   Collection Time    05/07/13 10:26 PM      Result Value Ref Range Status   Specimen Description BLOOD LEFT HAND   Final   Special Requests BOTTLES DRAWN AEROBIC ONLY 10CC   Final   Culture  Setup Time     Final   Value: 05/08/2013 13:09     Performed at [facility]   Culture     Final   Value:        BLOOD CULTURE RECEIVED NO GROWTH TO DATE CULTURE WILL BE HELD FOR 5 DAYS BEFORE ISSUING A FINAL NEGATIVE REPORT     Performed at [facility]   Report Status PENDING   Incomplete       Studies: Ct Abdomen Pelvis W  Contrast  05/08/2013   CLINICAL DATA:  Hypertension  EXAM: CT ABDOMEN AND PELVIS WITH CONTRAST  TECHNIQUE: Multidetector CT imaging of the abdomen and pelvis was performed using the standard protocol following bolus administration of intravenous contrast.  CONTRAST:  141mL OMNIPAQUE IOHEXOL 300 MG/ML  SOLN  COMPARISON:  05/12/2011  FINDINGS: BODY WALL: Ventral hernia/diastasis rectus status post probable mesh repair. There is wide necked recurrence, containing nonobstructed bowel.  LOWER CHEST: Extensive coronary atherosclerosis. Mild atelectasis and trace effusions dependently.  ABDOMEN/PELVIS:  Liver: No focal abnormality.  Biliary: Cholecystectomy.  Pancreas: Unremarkable.  Spleen: Unremarkable.  Adrenals: Globally thickened left adrenal gland, stable from prior.  Kidneys and ureters: In general multiple  bilateral renal hypodensities appear stable in appearance. A lesion from the anterior lower pole left kidney which had typical appearance of cyst previously, has mildly decreased in size and increase in density, most consistent with interval hemorrhage. The 5 mm stone in the lower pole left kidney. 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  Bladder: Largely obscured by right hip prosthesis. There is a Foley catheter with incomplete drainage of the bladder.  Reproductive: Hysterectomy.  Probable oophorectomies.  Bowel: No obstruction. The rectal wall appears mildly thickened, but is underdistended. Appendectomy changes.  Retroperitoneum: No mass or adenopathy.  Peritoneum: No free fluid or gas.  Vascular: Aortobifemoral bypass. The graft is widely patent. There is no aneurysm. The imaged proximal superficial femoral arteries have extensive atherosclerosis with stenosis.  OSSEOUS: There is a total right hip arthroplasty. No acute fracture.  IMPRESSION: 1. No acute intra-abdominal findings. 2. Chronic findings are stable from 2013 and noted above.   Electronically Signed   By: Jorje Guild M.D.    On: 05/08/2013 04:19   Dg Chest Port 1 View  05/07/2013   CLINICAL DATA:  Chest pain  EXAM: PORTABLE CHEST - 1 VIEW  COMPARISON:  04/05/2012  FINDINGS: Chronic cardiomegaly. Mediastinal contours distorted by rightward rotation. Diffuse interstitial coarsening, accentuated by low lung volumes. No effusion or pneumothorax. Cholecystectomy changes.  IMPRESSION: Interstitial prominence which could be bronchitic or congestive.   Electronically Signed   By: Jorje Guild M.D.   On: 05/07/2013 23:15        Scheduled Meds: . [facility] HOLD] aspirin EC  81 mg Oral Daily  . [facility] HOLD] baclofen  20 mg Oral QHS  . [facility] HOLD] baclofen  5 mg Oral BID AC  . [facility] HOLD] cholecalciferol  1,000 Units Oral Daily  . [facility] HOLD] clopidogrel  75 mg Oral Daily  . [facility] HOLD] DULoxetine  60 mg Oral Daily  . [facility] HOLD] ferrous sulfate  325 mg Oral TID  . [facility] HOLD] gabapentin  300 mg Oral QHS  . [[facility] HOLD] insulin aspart  0-9 Units Subcutaneous TID WC  . [[facility] HOLD] insulin glargine  12 Units Subcutaneous QHS  . [facility] HOLD] Linaclotide  145 mcg Oral Daily  . [facility] HOLD] midodrine  5 mg Oral TID WC  . [[facility] HOLD] psyllium  1 packet Oral Daily  . [[facility] HOLD] senna-docusate  1 tablet Oral BID  . [facility] HOLD] simvastatin  20 mg Oral q1800  . [facility] HOLD] sodium chloride  3 mL Intravenous Q12H   Continuous Infusions: . sodium chloride 50 mL/hr at 05/09/13 0655  . sodium chloride 500 mL (05/09/13 1349)    Active Problems:   CVA (cerebral infarction)   Hypertension   Type II or unspecified type diabetes mellitus with neurological manifestations, uncontrolled(250.62)   Hyperlipidemia   Orthostatic hypotension   Unspecified constipation   Hypotension   Fecal occult blood test positive   Altered mental status    Time spent: 30 minutes    Cadience Bradfield, MD, FACP, FHM. [facility] Pager [phone]  If 7PM-7AM, please contact night-coverage [email or web address] Password [email or web address] 05/09/2013, 2:00 PM    LOS: 2 days

## 2013-05-09 NOTE — ED Provider Notes (Signed)
Medical screening examination/treatment/procedure(s) were conducted as a shared visit with non-physician practitioner(s) and myself.  I personally evaluated the patient during the encounter.   EKG Interpretation   Date/Time:  Saturday May 07 2013 23:30:13 EST Ventricular Rate:  73 PR Interval:  183 QRS Duration: 74 QT Interval:  441 QTC Calculation: 486 R Axis:   51 Text Interpretation:  Sinus rhythm Probable left atrial enlargement  Probable LVH with secondary repol abnrm Borderline prolonged QT interval  Baseline wander Otherwise no significant change Confirmed by OTTER  MD,  OLGA ([id number]) on 05/07/2013 11:37:05 PM       Leota Jacobsen, MD 05/09/13 1455

## 2013-05-09 NOTE — Op Note (Addendum)
[facility] [place], [id number]   ENDOSCOPY PROCEDURE REPORT  PATIENT: Chelsey Gallagher, Chelsey Gallagher  MR#: [id number] BIRTHDATE: [birth year] , 62  yrs. old GENDER: Female ENDOSCOPIST: Lafayette Dragon, MD REFERRED BY:  Dr Algis Liming PROCEDURE DATE:  05/09/2013 PROCEDURE:  EGD, diagnostic ASA CLASS:     Class III INDICATIONS:  heme positive anemia.  Hemoglobin 8.6.  Patient on Plavix for CVA on March 2013.  Prior endoscopy in 1986 with questionably history of peptic ulcer disease. MEDICATIONS: These medications were titrated to patient response per physician's verbal order, Versed 2 mg IV, and Fentanyl 25 mcg IV TOPICAL ANESTHETIC: Cetacaine Spray  DESCRIPTION OF PROCEDURE: After the risks benefits and alternatives of the procedure were thoroughly explained, informed consent was obtained.  The Pentax Gastroscope [id number] endoscope was introduced through the mouth and advanced to the second portion of the duodenum. Without limitations.  The instrument was slowly withdrawn as the mucosa was fully examined.      Esophagus: Upper middle and distal esophagus appeared normal. There was no esophagitis. There was a normal constricting Schatzki's ring in distal esophagus which allowed the endoscope to traverse into. There was small 1 cm sliding hiatal hernia Stomach: Gastric folds appeared normal. Rugal pattern and gastric outlet were unremarkable. Retroflexion of the endoscope revealed normal fundus and cardia no blood in the stomach Duodenum: Duodenal bulb and descending duodenum appeared normal[         The scope was then withdrawn from the patient and the procedure completed.  COMPLIIONS: There were no complications. ENDOSCOC IMPRESSION  essentially normal upper endoscopy of the esophagus stomach and duodenum. Nothing to account for heme positive stool. Schatzki's ring which is nonobstructing. No dilated  RECOMMENDATIONS  Antireflux measures. Follow  H&H and stool Hemoccults. Continue Plavix and aspirin for stroke If bleeding continues consider colonoscopy, which would be a high risk procedure given her medical history Will see in the office only  on as needed basis  REPEAT EXAM: no  eSigned:  Lafayette Dragon, MD 05/09/2013 3:47 PM   CC:  PATIENT NAME:  [name], [name] MR#: [id number]       I have spoken to pt's niece who is interested in further evaluation of anemia and blood loss. We don't have a prior colonoscopy report from [facility]. Will proceed with colonoscopy tomorrow. Pt agrees  And understands the prep. She will continue anticoagulation as ordered. Prep ordered.

## 2013-05-09 NOTE — Evaluation (Signed)
Physical Therapy Evaluation Patient Details Name: Chelsey Gallagher MRN: [id number] DOB: 04/30/1950 Today's Date: 05/09/2013 Time: 7829-5621 PT Time Calculation (min): 24 min  PT Assessment / Plan / Recommendation History of Present Illness  63 y.o. 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   Clinical Impression  Pt was adm with the above dx. She presents with R side hemiplegia and expressive aphasia due to a previous CVA. Pt presents similar to baseline function at [facility]. Functional mobility limited to R hemiplegia requiring assistance with sit to stand transfers and self cleaning. Pt to benefit from skilled acute PT to address functional mobility and other deficits listed below    PT Assessment  Patient needs continued PT services    Follow Up Recommendations  SNF    Does the patient have the potential to tolerate intense rehabilitation      Barriers to Discharge        Equipment Recommendations  None recommended by PT (pt has hemiwalker and electric wc at SNF)    Recommendations for Other Services     Frequency Min 2X/week    Precautions / Restrictions Precautions Precautions: Fall Precaution Comments: residual R side hemiplegia Restrictions Weight Bearing Restrictions: No   Pertinent Vitals/Pain Pt reports R foot pain      Mobility  Bed Mobility Overal bed mobility: Needs Assistance Bed Mobility: Rolling;Supine to Sit Rolling: Min assist Supine to sit: Min assist General bed mobility comments: pt required verbal cues for hand placement and safety. Transfers Overall transfer level: Needs assistance Transfers: Stand Pivot Transfers Stand pivot transfers: Mod assist General transfer comment: Pt pivoted on  L foot. Unable to WB on R foot due to pain    Exercises     PT Diagnosis: Difficulty walking;Generalized weakness;Hemiplegia non-dominant side  PT Problem List: Decreased strength;Decreased balance;Decreased mobility;Decreased knowledge of use of DME;Pain PT Treatment Interventions: DME instruction;Gait training;Functional mobility training;Therapeutic activities;Therapeutic exercise;Balance training;Patient/family education     PT Goals(Current goals can be found in the care plan section) Acute Rehab PT Goals Patient Stated Goal: return to rehab facility PT Goal Formulation: With patient Time For Goal Achievement: 05/16/13 Potential to Achieve Goals: Good  Visit Information  Last PT Received On: 05/09/13 Assistance Needed: +1 History of Present Illness: 63 y.o. 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        Prior Functioning  Home Living Family/patient expects to be discharged to:: Skilled nursing facility (pt resident at [facility]) Prior Function Level of Independence: Needs assistance Gait / Transfers Assistance Needed: typically uses an electric WC and a hemiwalker. req assistance for sit to stand transfers ADL's / Homemaking Assistance Needed: Pt req assist to shower chair and cleaning backside and L UE otherwise supervision with bathing. Communication Communication: Expressive difficulties (able to answer all questions) Dominant Hand: Left    Cognition  Cognition Arousal/Alertness: Awake/alert Behavior During Therapy: WFL for tasks assessed/performed Overall Cognitive Status: Within Functional Limits for tasks assessed    Extremity/Trunk Assessment Upper Extremity Assessment Upper Extremity Assessment: RUE  deficits/detail RUE Deficits / Details: pt with  residual spastic hemiplegia from previous stroke RUE Sensation:  (light touch sensation intact ) Lower Extremity Assessment Lower Extremity Assessment: LLE deficits/detail LLE Deficits / Details: pt with residual spastic hemiplegia from previous stroke LLE Sensation:  (light touch sensation intact )   Balance Balance Overall balance assessment: Needs assistance Sitting-balance support: Single extremity supported Sitting balance-Leahy Scale: Poor Standing balance support: Single extremity supported Standing balance-Leahy Scale: Poor Standing balance comment: unable to bear weight in R LE due top R foot pain  End of Session PT - End of Session Equipment Utilized During Treatment: Gait belt Activity Tolerance: Patient tolerated treatment well Patient left: in chair;with call bell/phone within reach Nurse Communication: Mobility status  GP Functional Assessment Tool Used: clinical judgement Functional Limitation: Mobility: Walking and moving around Mobility: Walking and Moving Around Current Status ([id number]): At least 60 percent but less than 80 percent impaired, limited or restricted Mobility: Walking and Moving Around Goal Status [phone]): At least 40 percent but less than 60 percent impaired, limited or restricted   Kohler,Andrew SPT 05/09/2013, 10:59 AM  Agree with above assessment.  Kittie Plater, PT, DPT Pager #: [phone] Office #: [phone]

## 2013-05-10 ENCOUNTER — Encounter (HOSPITAL_COMMUNITY)
Admission: EM | Disposition: A | Discharge status: Skilled Nursing Facility | Payer: Self-pay | Source: Home / Self Care | Attending: Internal Medicine

## 2013-05-10 ENCOUNTER — Encounter (HOSPITAL_COMMUNITY): Payer: Self-pay | Admitting: Internal Medicine

## 2013-05-10 DIAGNOSIS — I959 Hypotension, unspecified: Secondary | ICD-10-CM | POA: Diagnosis not present

## 2013-05-10 DIAGNOSIS — C384 Malignant neoplasm of pleura: Secondary | ICD-10-CM | POA: Diagnosis not present

## 2013-05-10 DIAGNOSIS — N390 Urinary tract infection, site not specified: Secondary | ICD-10-CM | POA: Diagnosis not present

## 2013-05-10 DIAGNOSIS — D509 Iron deficiency anemia, unspecified: Secondary | ICD-10-CM | POA: Diagnosis not present

## 2013-05-10 DIAGNOSIS — R4182 Altered mental status, unspecified: Secondary | ICD-10-CM

## 2013-05-10 DIAGNOSIS — R195 Other fecal abnormalities: Secondary | ICD-10-CM | POA: Diagnosis not present

## 2013-05-10 HISTORY — PX: COLONOSCOPY: SHX5424

## 2013-05-10 LAB — GLUCOSE, CAPILLARY
GLUCOSE-CAPILLARY: 121 mg/dL — AB (ref 70–99)
Glucose-Capillary: 130 mg/dL — ABNORMAL HIGH (ref 70–99)
Glucose-Capillary: 215 mg/dL — ABNORMAL HIGH (ref 70–99)

## 2013-05-10 SURGERY — COLONOSCOPY
Anesthesia: Moderate Sedation

## 2013-05-10 MED ORDER — MIDAZOLAM HCL 5 MG/5ML IJ SOLN
INTRAMUSCULAR | Status: DC | PRN
  Administered 2013-05-10: 2 mg via INTRAVENOUS

## 2013-05-10 MED ORDER — METOPROLOL TARTRATE 1 MG/ML IV SOLN
INTRAVENOUS | Status: DC | PRN
  Administered 2013-05-10: 2.5 mg via INTRAVENOUS

## 2013-05-10 MED ORDER — FENTANYL CITRATE 0.05 MG/ML IJ SOLN
INTRAMUSCULAR | Status: DC | PRN
  Administered 2013-05-10: 25 ug via INTRAVENOUS

## 2013-05-10 MED ORDER — METOPROLOL TARTRATE 1 MG/ML IV SOLN
INTRAVENOUS | Status: AC
  Filled 2013-05-10: qty 5

## 2013-05-10 MED ORDER — FENTANYL CITRATE 0.05 MG/ML IJ SOLN
INTRAMUSCULAR | Status: AC
  Filled 2013-05-10: qty 2

## 2013-05-10 MED ORDER — MIDAZOLAM HCL 5 MG/ML IJ SOLN
INTRAMUSCULAR | Status: AC
  Filled 2013-05-10: qty 2

## 2013-05-10 NOTE — Discharge Summary (Addendum)
Physician Discharge Summary  Chelsey Gallagher VOJ:[id number] DOB: May 12, 1950 DOA: 05/07/2013  PCP: Hennie Duos, MD  Admit date: 05/07/2013 Discharge date: 05/10/2013  Time spent: Less than 30 minutes  Recommendations for Outpatient Follow-up:  1. Dr. Inocencio Homes, PCP in 1 week with repeat labs (CBC)  Discharge Diagnoses:  Active Problems:   CVA (cerebral infarction)   Hypertension   Type II or unspecified type diabetes mellitus with neurological manifestations, uncontrolled(250.62)   Hyperlipidemia   Orthostatic hypotension   Unspecified constipation   Hypotension   Fecal occult blood test positive   Altered mental status   Discharge Condition: Improved & Stable  Diet recommendation: Heart healthy and diabetic diet.  Filed Weights   05/08/13 0707 05/10/13 0545 05/10/13 1153  Weight: 70.308 kg (155 lb) 70.105 kg (154 lb 8.9 oz) 69.854 kg (154 lb)    History of present illness:  63 y.o. 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 In the ED, initial blood pressure of 80/42 mmHg which improved after 3 L of IV fluid boluses, sodium 132, glucose 167, hemoglobin 8.6, troponin x1 negative, CT abdomen and pelvis with contrast without acute findings, chest x-ray showing interstitial prominence which could be bronchitic or congestive and FOBT +. Lactate was elevated at 3.37. UA negative for features of UTI. Hospitalist admission requested.  Hospital Course:   1. Hypotension: Possibly secondary to dehydration from decreased oral intake. Patient not on diuretics or oral antihypertensives. She does have a history of orthostatic hypotension for which she takes midodrine. Resolved after IV fluid resuscitation.  2. Altered mental  status: Unclear etiology.? Related to problem #1. No reported syncope. No new focal deficits. Patient has been fully coherent since admission. No metabolic or infectious etiology appreciated. Resolved. 3. FOBT +/Chronic anemia: As per records, patient's hemoglobin was 5.6 on 04/26/13 suggesting that it improved. Her baseline hemoglobin is probably in the 8-9 g per DL range. She is on dual antiplatelet agents for her stroke. Abdominal discomfort of unclear etiology. CT abdomen negative. Hemoglobin stable. GI consultation and followup appreciated. EGD and colonoscopy did not reveal source of bleeding & detailed reports are as below. Discussed with Dr. Olevia Perches, GI who recommends continued aspirin, Plavix, iron supplements, periodically check CBCs and transfuse as needed. Consider discussing with patient's primary neurologist if she needs to be on the dual antiplatelets or aspirin can be discontinued. She could still have slow GI bleed. May also consider outpatient hematology consultation. 4. Lactic acidosis: Probably related to hypotension and poor perfusion. Hydrated with IV fluids. Resolved. 5. Hypoxia: unclear etiology. Resolved. CXR without acute findings. 6. Type II DM with peripheral neuropathy and PAD: Continue home dose of Lantus and metformin which had been held postcontrast for 48 hours.  7. Constipation: Continue bowel regimen. 8. History of hyperlipidemia: Continue statins. 9. History of CVA with residual right hemiplegia: Continue aspirin, Plavix. See discussion above 10. DO NOT RESUSCITATE  Consultations:  Gastroenterology  Procedures:  EGD: Essentially normal upper endoscopy of the esophagus, stomach and duodenum. Nothing to account for heme positive stool. Schatzki's ring which is nonobstructing. Not dilated. Recommendations: Antireflux measures. Follow H&H and stool Hemoccults. Continue Plavix and aspirin for stroke.  Colonoscopy: Normal colon. Nothing to account for heme-positive  stools. Recommendations: Resume diet. Continue Plavix and aspirin. Closely  monitor stool Hemoccults and hemoglobin. Iron supplements. If hemoglobin continues to decline would consider small bowel capsule endoscopy says outpatient.   Discharge Exam:  Complaints:  Patient denies complaints.  Filed Vitals:   05/10/13 1320 05/10/13 1330 05/10/13 1340 05/10/13 1508  BP:  218/80 214/99 113/53  Pulse: 88 85 82 97  Temp:    97.9 F (36.6 C)  TempSrc:    Oral  Resp: 23 23 26 24   Height:      Weight:      SpO2: 99% 99% 100% 96%    General exam: Pleasant middle-aged female lying comfortably in bed  Respiratory system: Clear. No increased work of breathing.  Cardiovascular system: S1 & S2 heard, RRR. No JVD, murmurs, gallops, clicks or pedal edema. Telemetry: Sinus tachycardia in the 100's with occasional PVC's.  Gastrointestinal system: Abdomen is nondistended, soft and nontender. Normal bowel sounds heard.  Central nervous system: Alert and oriented. No cranial nerve deficits.  Extremities: 5 x 5 power in left limbs. Grade 4 x 5 power in right limbs which are spastic Peripheral pulses symmetrically felt.    Discharge Instructions      Discharge Orders   Future Orders Complete By Expires   Call MD for:  extreme fatigue  As directed    Call MD for:  persistant dizziness or light-headedness  As directed    Diet - low sodium heart healthy  As directed    Diet Carb Modified  As directed    Increase activity slowly  As directed        Medication List         acetaminophen 325 MG tablet  Commonly known as:  TYLENOL  Take 650 mg by mouth every 6 (six) hours as needed for pain.     aspirin EC 81 MG tablet  Take 81 mg by mouth daily.     baclofen 20 MG tablet  Commonly known as:  LIORESAL  Take 20 mg by mouth at bedtime.     baclofen 10 MG tablet  Commonly known as:  LIORESAL  Take 5 mg by mouth 2 (two) times daily. 5 mg at 8a and 4pm     BENEFIBER Powd  Take 1 Applicatorful  by mouth daily. Mix with 8oz of fluid     BIOFREEZE 4 % Gel  Generic drug:  Menthol (Topical Analgesic)  Apply 1 application topically every 6 (six) hours as needed (pain).     cholecalciferol 1000 UNITS tablet  Commonly known as:  VITAMIN D  Take 1,000 Units by mouth daily.     clopidogrel 75 MG tablet  Commonly known as:  PLAVIX  Take 75 mg by mouth daily.     DULoxetine 30 MG capsule  Commonly known as:  CYMBALTA  Take 60 mg by mouth daily.     ferrous sulfate 325 (65 FE) MG tablet  Take 325 mg by mouth 3 (three) times daily.     gabapentin 300 MG capsule  Commonly known as:  NEURONTIN  Take 300 mg by mouth at bedtime.     insulin glargine 100 UNIT/ML injection  Commonly known as:  LANTUS  Inject 12 Units into the skin at bedtime.     LINZESS 145 MCG Caps capsule  Generic drug:  Linaclotide  Take 145 mcg by mouth daily.     magnesium hydroxide 800 MG/5ML suspension  Commonly known as:  MILK OF MAGNESIA  Take 30 mLs by mouth daily as needed for constipation. Aubrey  menthol-cetylpyridinium 3 MG lozenge  Commonly known as:  CEPACOL  Take 1 lozenge by mouth every 4 (four) hours as needed for sore throat.     metFORMIN 500 MG tablet  Commonly known as:  GLUCOPHAGE  Take 1,000 mg by mouth 2 (two) times daily with a meal.     midodrine 5 MG tablet  Commonly known as:  PROAMATINE  Take 5 mg by mouth 3 (three) times daily.     polyethylene glycol packet  Commonly known as:  MIRALAX / GLYCOLAX  Take 17 g by mouth daily as needed for mild constipation.     pravastatin 40 MG tablet  Commonly known as:  PRAVACHOL  Take 40 mg by mouth daily.     sennosides-docusate sodium 8.6-50 MG tablet  Commonly known as:  SENOKOT-S  Take 1 tablet by mouth 2 (two) times daily.     traMADol 100 MG 24 hr tablet  Commonly known as:  ULTRAM-ER  Take 100 mg by mouth every 8 (eight) hours as needed for pain.       Follow-up Information   Follow up with Hennie Duos, MD.  Schedule an appointment as soon as possible for a visit in 1 week. (To be seen with repeat labs (CBC))    Specialty:  Internal Medicine   Contact information:   [place] [phone]        The results of significant diagnostics from this hospitalization (including imaging, microbiology, ancillary and laboratory) are listed below for reference.    Significant Diagnostic Studies: Ct Abdomen Pelvis W Contrast  05/08/2013   CLINICAL DATA:  Hypertension  EXAM: CT ABDOMEN AND PELVIS WITH CONTRAST  TECHNIQUE: Multidetector CT imaging of the abdomen and pelvis was performed using the standard protocol following bolus administration of intravenous contrast.  CONTRAST:  13mL OMNIPAQUE IOHEXOL 300 MG/ML  SOLN  COMPARISON:  05/12/2011  FINDINGS: BODY WALL: Ventral hernia/diastasis rectus status post probable mesh repair. There is wide necked recurrence, containing nonobstructed bowel.  LOWER CHEST: Extensive coronary atherosclerosis. Mild atelectasis and trace effusions dependently.  ABDOMEN/PELVIS:  Liver: No focal abnormality.  Biliary: Cholecystectomy.  Pancreas: Unremarkable.  Spleen: Unremarkable.  Adrenals: Globally thickened left adrenal gland, stable from prior.  Kidneys and ureters: In general multiple bilateral renal hypodensities appear stable in appearance. A lesion from the anterior lower pole left kidney which had typical appearance of cyst previously, has mildly decreased in size and increase in density, most consistent with interval hemorrhage. The 5 mm stone in the lower pole left kidney. 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  Bladder: Largely obscured by right hip prosthesis. There is a Foley catheter with incomplete drainage of the bladder.  Reproductive: Hysterectomy.  Probable oophorectomies.  Bowel: No obstruction. The rectal wall appears mildly thickened, but is underdistended. Appendectomy changes.  Retroperitoneum: No mass or adenopathy.   Peritoneum: No free fluid or gas.  Vascular: Aortobifemoral bypass. The graft is widely patent. There is no aneurysm. The imaged proximal superficial femoral arteries have extensive atherosclerosis with stenosis.  OSSEOUS: There is a total right hip arthroplasty. No acute fracture.  IMPRESSION: 1. No acute intra-abdominal findings. 2. Chronic findings are stable from 2013 and noted above.   Electronically Signed   By: Jorje Guild M.D.   On: 05/08/2013 04:19   Dg Chest Port 1 View  05/07/2013   CLINICAL DATA:  Chest pain  EXAM: PORTABLE CHEST - 1 VIEW  COMPARISON:  04/05/2012  FINDINGS: Chronic  cardiomegaly. Mediastinal contours distorted by rightward rotation. Diffuse interstitial coarsening, accentuated by low lung volumes. No effusion or pneumothorax. Cholecystectomy changes.  IMPRESSION: Interstitial prominence which could be bronchitic or congestive.   Electronically Signed   By: Jorje Guild M.D.   On: 05/07/2013 23:15    Microbiology: Recent Results (from the past 240 hour(s))  CULTURE, BLOOD (ROUTINE X 2)     Status: None   Collection Time    05/07/13 10:21 PM      Result Value Ref Range Status   Specimen Description BLOOD LEFT ARM   Final   Special Requests BOTTLES DRAWN AEROBIC AND ANAEROBIC 10CC EACH   Final   Culture  Setup Time     Final   Value: 05/08/2013 13:09     Performed at [facility]   Culture     Final   Value:        BLOOD CULTURE RECEIVED NO GROWTH TO DATE CULTURE WILL BE HELD FOR 5 DAYS BEFORE ISSUING A FINAL NEGATIVE REPORT     Performed at [facility]   Report Status PENDING   Incomplete  CULTURE, BLOOD (ROUTINE X 2)     Status: None   Collection Time    05/07/13 10:26 PM      Result Value Ref Range Status   Specimen Description BLOOD LEFT HAND   Final   Special Requests BOTTLES DRAWN AEROBIC ONLY 10CC   Final   Culture  Setup Time     Final   Value: 05/08/2013 13:09     Performed at [facility]   Culture     Final   Value:         BLOOD CULTURE RECEIVED NO GROWTH TO DATE CULTURE WILL BE HELD FOR 5 DAYS BEFORE ISSUING A FINAL NEGATIVE REPORT     Performed at [facility]   Report Status PENDING   Incomplete  URINE CULTURE     Status: None   Collection Time    05/07/13 10:45 PM      Result Value Ref Range Status   Specimen Description URINE, CATHETERIZED   Final   Special Requests NONE   Final   Culture  Setup Time     Final   Value: 05/08/2013 15:53     Performed at [facility]     Final   Value: NO GROWTH     Performed at [facility]   Culture     Final   Value: NO GROWTH     Performed at [facility]   Report Status 05/09/2013 FINAL   Final     Labs: Basic Metabolic Panel:  Recent Labs Lab 05/07/13 2223 05/09/13 0815  NA 132* 140  K 4.6 4.3  CL 94* 104  CO2 23 23  GLUCOSE 167* 136*  BUN 20 11  CREATININE 0.82 0.63  CALCIUM 9.8 10.0   Liver Function Tests:  Recent Labs Lab 05/07/13 2223  AST 32  ALT 20  ALKPHOS 58  BILITOT 0.2*  PROT 6.6  ALBUMIN 3.3*   No results found for this basename: LIPASE, AMYLASE,  in the last 168 hours No results found for this basename: AMMONIA,  in the last 168 hours CBC:  Recent Labs Lab 05/07/13 2223 05/09/13 0815  WBC 8.1 6.0  NEUTROABS 5.4  --   HGB 8.6* 9.6*  HCT 27.9* 31.7*  MCV 86.6 86.1  PLT 253 247   Cardiac Enzymes:  Recent Labs Lab 05/07/13 2227  TROPONINI <0.30   BNP: BNP (last 3 results) No results found for this basename: PROBNP,  in the last 8760 hours CBG:  Recent Labs Lab 05/09/13 1155 05/09/13 1652 05/09/13 2217 05/10/13 0810 05/10/13 1206  GLUCAP 117* 116* 110* 130* 121*      Signed:  Vernell Leep, MD, FACP, FHM. [facility] Pager [phone]  If 7PM-7AM, please contact night-coverage [email or web address] Password [facility] 05/10/2013, 3:33 PM

## 2013-05-10 NOTE — Progress Notes (Signed)
PT Cancellation Note  Patient Details Name: Chelsey Gallagher MRN: [id number] DOB: 1950/07/23   Cancelled Treatment:    Reason Eval/Treat Not Completed: Patient declined, no reason specified.  Pt groggy after endoscopy procedure today.  She did not feel like getting up right now.  PT to check back tomorrow if she is still here.   Thanks,   Barbarann Ehlers. [place], [place], DPT [phone]   05/10/2013, 4:08 PM

## 2013-05-10 NOTE — Op Note (Signed)
[name], [id number]   COLONOSCOPY PROCEDURE REPORT  PATIENT: Chelsey Gallagher, Chelsey Gallagher  MR#: [id number] BIRTHDATE: [birth year] , 62  yrs. old GENDER: Female ENDOSCOPIST: Lafayette Dragon, MD REFERRED BY:Dr Hongalgi PROCEDURE DATE:  05/10/2013 PROCEDURE:   Colonoscopy, diagnostic First Screening Colonoscopy - Avg.  risk and is 50 yrs.  old or older - No.  Prior Negative Screening - Now for repeat screening. N/A  History of Adenoma - Now for follow-up colonoscopy & has been > or = to 3 yrs.  N/A History of Adenoma - Now for follow-up colonoscopy & has been > or = to 3 yrs.  Yes hx of adenoma.  Has been 3 or more years since last colonoscopy.  Polyps Removed Today? No.  Recommend repeat exam, <10 yrs? No. ASA CLASS:   Class III INDICATIONS:Hemoccult positive and anemia.  Hemoglobin 8.6.  Upper endoscopy negative, patient is on Plavix and aspirin for stroke. MEDICATIONS: These medications were titrated to patient response per physician's verbal order, Fentanyl 25 mcg IV, and Versed 2 mg IV  DESCRIPTION OF PROCEDURE:   After the risks benefits and alternatives of the procedure were thoroughly explained, informed consent was obtained.  A digital rectal exam revealed no abnormalities of the rectum.   The Pentax Ped Colon [id number] endoscope was introduced through the anus and advanced to the cecum, which was identified by both the appendix and ileocecal valve. No adverse events experienced.   The quality of the prep was Moviprep fair  The instrument was then slowly withdrawn as the colon was fully examined.      COLON FINDINGS: A normal appearing cecum, ileocecal valve, and appendiceal orifice were identified.  The ascending, hepatic flexure, transverse, splenic flexure, descending, sigmoid colon and rectum appeared unremarkable.  No polyps or cancers were seen. Retroflexed views revealed no abnormalities. The time to cecum=5 minutes 25  seconds.  Withdrawal time=7 minutes 15 seconds.  The scope was withdrawn and the procedure completed. COMPLICATIONS: There were no complications.  ENDOSCOPIC IMPRESSION: Normal colon nothing to account for heme positive anemia  RECOMMENDATIONS: resume diet Continue Plavix and aspirin Closely monitor stool Hemoccults and hemoglobin Iron supplements. If the hemoglobin continues to decline would consider small bowel capsule endoscopyas an outpatient  eSigned:  Lafayette Dragon, MD 05/10/2013 1:03 PM   cc:   PATIENT NAME:  [name], [name] MR#: [id number]

## 2013-05-10 NOTE — Progress Notes (Signed)
Clinical Social Work Department BRIEF PSYCHOSOCIAL ASSESSMENT 05/10/2013  Patient:  Chelsey Gallagher, Chelsey Gallagher     Account Number:  [id number]     Admit date:  05/07/2013  Clinical Social Worker:  Adair Laundry  Date/Time:  05/10/2013 11:00 AM  Referred by:  Physician  Date Referred:  05/10/2013 Referred for  SNF Placement   Other Referral:   Interview type:  Family Other interview type:   Spoke with pt daughter ove the phone    PSYCHOSOCIAL DATA Living Status:  FACILITY Admitted from facility:  [facility] Level of care:  [place] Primary support name:  Chelsey Gallagher [phone] Primary support relationship to patient:  CHILD, ADULT Degree of support available:   Pt has very supportive family    CURRENT CONCERNS Current Concerns  Post-Acute Placement   Other Concerns:    SOCIAL WORK ASSESSMENT / PLAN CSW visited pt room but pt unable to complete assessment at this time. CSW spoke with pt daughter Chelsey Gallagher over the phone. Chelsey Gallagher confirmed that pt is a long term resident at [facility] and plan will be for her to return at dc. Pt daughter had no current concerns and was pleased to hear from [place]. CSW left message for facility to update on current status.   Assessment/plan status:  Psychosocial Support/Ongoing Assessment of Needs Other assessment/ plan:   Information/referral to community resources:   None needed    PATIENT'S/FAMILY'S RESPONSE TO PLAN OF CARE: Pt family agreeable to pt returning to SNF at [facility], [place]

## 2013-05-10 NOTE — Progress Notes (Signed)
CSW (Clinical Social Worker) prepared pt dc packet and placed with shadow chart. CSW arranged non-emergent ambulance transport. Pt, pt family, pt nurse, and facility informed. CSW signing off.  Brandi Armato, LCSWA [phone]  

## 2013-05-11 ENCOUNTER — Non-Acute Institutional Stay (SKILLED_NURSING_FACILITY): Payer: Medicare Other | Admitting: Internal Medicine

## 2013-05-11 ENCOUNTER — Encounter (HOSPITAL_COMMUNITY): Payer: Self-pay | Admitting: Internal Medicine

## 2013-05-11 DIAGNOSIS — F3289 Other specified depressive episodes: Secondary | ICD-10-CM

## 2013-05-11 DIAGNOSIS — E785 Hyperlipidemia, unspecified: Secondary | ICD-10-CM

## 2013-05-11 DIAGNOSIS — G8110 Spastic hemiplegia affecting unspecified side: Secondary | ICD-10-CM

## 2013-05-11 DIAGNOSIS — E1149 Type 2 diabetes mellitus with other diabetic neurological complication: Secondary | ICD-10-CM

## 2013-05-11 DIAGNOSIS — I69959 Hemiplegia and hemiparesis following unspecified cerebrovascular disease affecting unspecified side: Secondary | ICD-10-CM | POA: Diagnosis not present

## 2013-05-11 DIAGNOSIS — I959 Hypotension, unspecified: Secondary | ICD-10-CM

## 2013-05-11 DIAGNOSIS — M6281 Muscle weakness (generalized): Secondary | ICD-10-CM | POA: Diagnosis not present

## 2013-05-11 DIAGNOSIS — R195 Other fecal abnormalities: Secondary | ICD-10-CM

## 2013-05-11 DIAGNOSIS — I6350 Cerebral infarction due to unspecified occlusion or stenosis of unspecified cerebral artery: Secondary | ICD-10-CM | POA: Diagnosis not present

## 2013-05-11 DIAGNOSIS — F32A Depression, unspecified: Secondary | ICD-10-CM | POA: Insufficient documentation

## 2013-05-11 DIAGNOSIS — D509 Iron deficiency anemia, unspecified: Secondary | ICD-10-CM

## 2013-05-11 DIAGNOSIS — F329 Major depressive disorder, single episode, unspecified: Secondary | ICD-10-CM

## 2013-05-11 DIAGNOSIS — I639 Cerebral infarction, unspecified: Secondary | ICD-10-CM

## 2013-05-11 DIAGNOSIS — I951 Orthostatic hypotension: Secondary | ICD-10-CM

## 2013-05-11 NOTE — Assessment & Plan Note (Signed)
Continue cymbalta  

## 2013-05-11 NOTE — Assessment & Plan Note (Signed)
See above under cerebral artery occlusion with infart

## 2013-05-11 NOTE — Assessment & Plan Note (Signed)
S/p with R side spastic paresis- unchnaged, nothing new;continue plavix and ASA but ask Neuro if we can d/c the ASA on the suspicion that she may have a slow, slow GI bleed problem;

## 2013-05-11 NOTE — Assessment & Plan Note (Addendum)
Improved from prior;pt has asked if we can do something with the iron, it is making her BM as hard as rocks. Will start Iron once daily at night instead of TID and increase it to BID eventually.

## 2013-05-11 NOTE — Assessment & Plan Note (Signed)
Continue midodrine  

## 2013-05-11 NOTE — Assessment & Plan Note (Signed)
Neg GI work up and Hb had improved since 04/26/13 of 5.6

## 2013-05-11 NOTE — Assessment & Plan Note (Signed)
Pt's presentation which required 3 liters to improved. No routes of loss discovered, GI work-up neg even though FOBT+

## 2013-05-11 NOTE — Progress Notes (Signed)
MRN: [id number] Name: Chelsey Gallagher  Sex: female Age: 63 y.o. DOB: Jul 17, 1950  [place] #: [name] Facility/Room: 126A Level Of Care: SNF Provider: Inocencio Homes D Emergency Contacts: Extended Emergency Contact Information Primary Emergency Contact: Devoria Albe [place] Phone: [phone] Mobile Phone: [phone] Relation: Daughter    Allergies: Codeine; Flexeril; and Lyrica  Chief Complaint  Patient presents with  . nursing home admission    HPI: Patient is 63 y.o. female who presented to ED with hypotension, hypoxia and MS change all which cleared with IVF and with a negative work-up. Pt is admitted back to SNF for OT/PT.  Past Medical History  Diagnosis Date  . Diabetes mellitus   . Hypertension   . PAD (peripheral artery disease)     S/p bypass grafting, unclear exactly where  . Nephrolithiasis   . Hyperlipidemia 05/14/2011  . Hearing loss   . Complication of anesthesia     doesn't wake up good- "usually end up in ICU" also experiences post op nausea and vomiting  . Orthostatic hypotension     after surgery.  Marland Kitchen Heart murmur     PCP- [facility]- No tx needed.  . Stroke 05/2011    Weakness on Right. Wears leg brace.  . Foot drop, left   . H/O hiatal hernia     second one  . Diabetic neuropathy     Diabetic neuropathy- has inproved since she quit smoking.  . Arthritis   . Depression     Past Surgical History  Procedure Laterality Date  . Total hip arthroplasty  2008    Right  . Abdominal hysterectomy  2003  . Cesarean section  1984  . Cholecystectomy  1984  . Bypass graft  2003    Abdominal aortic  . Kidney stone surgery  1999  . Appendectomy  2011  . Hernia repair  2011  . Lithotripsy    . Abdominal hysterectomy    . Esophagogastroduodenoscopy N/A 05/09/2013    Procedure: ESOPHAGOGASTRODUODENOSCOPY (EGD);  Surgeon: Lafayette Dragon, MD;  Location: [facility] ENDOSCOPY;  Service: Endoscopy;  Laterality: N/A;  . Colonoscopy  N/A 05/10/2013    Procedure: COLONOSCOPY;  Surgeon: Lafayette Dragon, MD;  Location: [facility] ENDOSCOPY;  Service: Endoscopy;  Laterality: N/A;      Medication List       This list is accurate as of: 05/11/13 10:15 PM.  Always use your most recent med list.               acetaminophen 325 MG tablet  Commonly known as:  TYLENOL  Take 650 mg by mouth every 6 (six) hours as needed for pain.     aspirin EC 81 MG tablet  Take 81 mg by mouth daily.     baclofen 20 MG tablet  Commonly known as:  LIORESAL  Take 20 mg by mouth at bedtime.     baclofen 10 MG tablet  Commonly known as:  LIORESAL  Take 5 mg by mouth 2 (two) times daily. 5 mg at 8a and 4pm     BENEFIBER Powd  Take 1 Applicatorful by mouth daily. Mix with 8oz of fluid     BIOFREEZE 4 % Gel  Generic drug:  Menthol (Topical Analgesic)  Apply 1 application topically every 6 (six) hours as needed (pain).     cholecalciferol 1000 UNITS tablet  Commonly known as:  VITAMIN D  Take 1,000 Units by mouth daily.     clopidogrel 75 MG tablet  Commonly known as:  PLAVIX  Take 75 mg by mouth daily.     DULoxetine 30 MG capsule  Commonly known as:  CYMBALTA  Take 60 mg by mouth daily.     ferrous sulfate 325 (65 FE) MG tablet  Take 325 mg by mouth 3 (three) times daily.     gabapentin 300 MG capsule  Commonly known as:  NEURONTIN  Take 300 mg by mouth at bedtime.     insulin glargine 100 UNIT/ML injection  Commonly known as:  LANTUS  Inject 12 Units into the skin at bedtime.     LINZESS 145 MCG Caps capsule  Generic drug:  Linaclotide  Take 145 mcg by mouth daily.     magnesium hydroxide 800 MG/5ML suspension  Commonly known as:  MILK OF MAGNESIA  Take 30 mLs by mouth daily as needed for constipation. 30cc     menthol-cetylpyridinium 3 MG lozenge  Commonly known as:  CEPACOL  Take 1 lozenge by mouth every 4 (four) hours as needed for sore throat.     metFORMIN 500 MG tablet  Commonly known as:  GLUCOPHAGE  Take 1,000  mg by mouth 2 (two) times daily with a meal.     midodrine 5 MG tablet  Commonly known as:  PROAMATINE  Take 5 mg by mouth 3 (three) times daily.     polyethylene glycol packet  Commonly known as:  MIRALAX / GLYCOLAX  Take 17 g by mouth daily as needed for mild constipation.     pravastatin 40 MG tablet  Commonly known as:  PRAVACHOL  Take 40 mg by mouth daily.     sennosides-docusate sodium 8.6-50 MG tablet  Commonly known as:  SENOKOT-S  Take 1 tablet by mouth 2 (two) times daily.     traMADol 100 MG 24 hr tablet  Commonly known as:  ULTRAM-ER  Take 100 mg by mouth every 8 (eight) hours as needed for pain.        No orders of the defined types were placed in this encounter.    Immunization History  Administered Date(s) Administered  . Influenza-Unspecified 12/04/2011  . Pneumococcal Polysaccharide-23 09/03/2011  . Pneumococcal-Unspecified 12/02/2010    History  Substance Use Topics  . Smoking status: Former Smoker -- 0.00 packs/day for 45 years    Types: Cigarettes  . Smokeless tobacco: Former Systems developer    Quit date: 05/08/2011  . Alcohol Use: No    Family history is noncontributory    Review of Systems  DATA OBTAINED: from patient, nurse, medical record, family member GENERAL: Feels well no fevers, fatigue, appetite changes SKIN: No itching, rash or wounds EYES: No eye pain, redness, discharge EARS: No earache, tinnitus, change in hearing NOSE: No congestion, drainage or bleeding  MOUTH/THROAT: No mouth or tooth pain, No sore throat, No difficulty chewing or swallowing  RESPIRATORY: No cough, wheezing, SOB CARDIAC: No chest pain, palpitations, lower extremity edema  GI: No abdominal pain, No N/V/D; + constipation made much worse by iron-can we do something about that?, No heartburn or reflux  GU: No dysuria, frequency or urgency, or incontinence  MUSCULOSKELETAL: No unrelieved bone/joint pain NEUROLOGIC: No headache, dizziness or focal weakness PSYCHIATRIC:  No overt anxiety or sadness. No behavior issue.   Filed Vitals:   05/11/13 2128  BP: 130/75  Pulse: 92  Temp: 97.7 F (36.5 C)  Resp: 20    Physical Exam  GENERAL APPEARANCE: Alert, conversant. Appropriately groomed. No acute distress.  SKIN: No diaphoresis rash, or  wounds HEAD: Normocephalic, atraumatic  EYES: Conjunctiva/lids clear. Pupils round, reactive. EOMs intact.  EARS: External exam WNL, canals clear. Hearing grossly normal.  NOSE: No deformity or discharge.  MOUTH/THROAT: Lips w/o lesions RESPIRATORY: Breathing is even, unlabored. Lung sounds are clear   CARDIOVASCULAR: Heart RRR no murmurs, rubs or gallops. No peripheral edema.   GASTROINTESTINAL: Abdomen is soft, non-tender, not distended w/ normal bowel sounds GENITOURINARY: Bladder non tender, not distended  MUSCULOSKELETAL: brace on habd abd ankle/foot NEUROLOGIC: Oriented X3. Cranial nerves 2-12 grossly intact.R hemiplegia PSYCHIATRIC: Mood and affect appropriate to situation, no behavioral issues  Patient Active Problem List   Diagnosis Date Noted  . Depression   . Hypotension 05/08/2013  . Fecal occult blood test positive 05/08/2013  . Altered mental status 05/08/2013  . Insomnia 04/26/2013  . UTI (urinary tract infection) 07/02/2012  . Anemia, iron deficiency 07/02/2012  . Ingrown toenail 07/02/2012  . Unspecified constipation 07/02/2012  . 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 Unspecified cerebral artery occlusion with cerebral infarction 04/13/2012  . Orthostatic hypotension 04/06/2012  . Abnormal TSH 04/06/2012  . Spastic hemiplegia affecting dominant side 07/25/2011  . Stroke 05/15/2011  . Hyperlipidemia 05/14/2011  . Tobacco abuse 05/14/2011  . 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 Lung abnormality 05/11/2011  . CVA (cerebral infarction) 05/08/2011  . Hypertension   . Type II or unspecified type diabetes mellitus with neurological  manifestations, uncontrolled(250.62)     CBC    Component Value Date/Time   WBC 6.0 05/09/2013 0815   RBC 3.68* 05/09/2013 0815   RBC 2.99* 04/05/2012 0345   HGB 9.6* 05/09/2013 0815   HCT 31.7* 05/09/2013 0815   PLT 247 05/09/2013 0815   MCV 86.1 05/09/2013 0815   LYMPHSABS 2.0 05/07/2013 2223   MONOABS 0.6 05/07/2013 2223   EOSABS 0.1 05/07/2013 2223   BASOSABS 0.0 05/07/2013 2223    CMP     Component Value Date/Time   NA 140 05/09/2013 0815   K 4.3 05/09/2013 0815   CL 104 05/09/2013 0815   CO2 23 05/09/2013 0815   GLUCOSE 136* 05/09/2013 0815   BUN 11 05/09/2013 0815   CREATININE 0.63 05/09/2013 0815   CALCIUM 10.0 05/09/2013 0815   PROT 6.6 05/07/2013 2223   ALBUMIN 3.3* 05/07/2013 2223   AST 32 05/07/2013 2223   ALT 20 05/07/2013 2223   ALKPHOS 58 05/07/2013 2223   BILITOT 0.2* 05/07/2013 2223   GFRNONAA >90 05/09/2013 0815   GFRAA >90 05/09/2013 0815    Assessment and Plan  Hypotension Pt's presentation which required 3 liters to improved. No routes of loss discovered, GI work-up neg even though FOBT+  Fecal occult blood test positive Neg GI work up and Hb had improved since 04/26/13 of 5.6  Anemia, iron deficiency Improved from prior;pt has asked if we can do something with the iron, it is making her BM as hard as rocks. Will start Iron once daily at night instead of TID and increase it to BID eventually.  Unspecified cerebral artery occlusion with cerebral infarction S/p with R side spastic paresis- unchnaged, nothing new;continue plavix and ASA but ask Neuro if we can d/c the ASA on the suspicion that she may have a slow, slow GI bleed problem;  Orthostatic hypotension Continue midodrine  Type II or unspecified type diabetes mellitus with neurological manifestations, uncontrolled(250.62) Continue glucophage, insulin and statin; pt not on ACE, presumalby sec to orthostatic hypotensionl;continue neurontin for neuropathy  Stroke See above  under cerebral artery occlusion with  infart  Hyperlipidemia Continue pravachol  Spastic hemiplegia affecting dominant side Continue baclofen  Depression Continue cymbalta    Hennie Duos, MD

## 2013-05-11 NOTE — Assessment & Plan Note (Signed)
Continue baclofen 

## 2013-05-11 NOTE — Assessment & Plan Note (Addendum)
Continue glucophage, insulin and statin; pt not on ACE, presumalby sec to orthostatic hypotensionl;continue neurontin for neuropathy

## 2013-05-11 NOTE — Assessment & Plan Note (Signed)
Continue pravachol.  

## 2013-05-12 DIAGNOSIS — M6281 Muscle weakness (generalized): Secondary | ICD-10-CM | POA: Diagnosis not present

## 2013-05-12 DIAGNOSIS — I69959 Hemiplegia and hemiparesis following unspecified cerebrovascular disease affecting unspecified side: Secondary | ICD-10-CM | POA: Diagnosis not present

## 2013-05-13 DIAGNOSIS — M6281 Muscle weakness (generalized): Secondary | ICD-10-CM | POA: Diagnosis not present

## 2013-05-13 DIAGNOSIS — I69959 Hemiplegia and hemiparesis following unspecified cerebrovascular disease affecting unspecified side: Secondary | ICD-10-CM | POA: Diagnosis not present

## 2013-05-14 LAB — CULTURE, BLOOD (ROUTINE X 2)
Culture: NO GROWTH
Culture: NO GROWTH

## 2013-05-16 DIAGNOSIS — E1129 Type 2 diabetes mellitus with other diabetic kidney complication: Secondary | ICD-10-CM | POA: Diagnosis not present

## 2013-05-16 DIAGNOSIS — I69959 Hemiplegia and hemiparesis following unspecified cerebrovascular disease affecting unspecified side: Secondary | ICD-10-CM | POA: Diagnosis not present

## 2013-05-16 DIAGNOSIS — I6322 Cerebral infarction due to unspecified occlusion or stenosis of basilar arteries: Secondary | ICD-10-CM | POA: Diagnosis not present

## 2013-05-16 DIAGNOSIS — N200 Calculus of kidney: Secondary | ICD-10-CM | POA: Diagnosis not present

## 2013-05-16 DIAGNOSIS — M6281 Muscle weakness (generalized): Secondary | ICD-10-CM | POA: Diagnosis not present

## 2013-05-16 DIAGNOSIS — D649 Anemia, unspecified: Secondary | ICD-10-CM | POA: Diagnosis not present

## 2013-05-16 DIAGNOSIS — E785 Hyperlipidemia, unspecified: Secondary | ICD-10-CM | POA: Diagnosis not present

## 2013-05-16 DIAGNOSIS — I359 Nonrheumatic aortic valve disorder, unspecified: Secondary | ICD-10-CM | POA: Diagnosis not present

## 2013-05-17 DIAGNOSIS — I69959 Hemiplegia and hemiparesis following unspecified cerebrovascular disease affecting unspecified side: Secondary | ICD-10-CM | POA: Diagnosis not present

## 2013-05-17 DIAGNOSIS — M6281 Muscle weakness (generalized): Secondary | ICD-10-CM | POA: Diagnosis not present

## 2013-05-18 ENCOUNTER — Encounter: Payer: Self-pay | Admitting: Internal Medicine

## 2013-05-18 ENCOUNTER — Non-Acute Institutional Stay (SKILLED_NURSING_FACILITY): Payer: Medicare Other | Admitting: Internal Medicine

## 2013-05-18 DIAGNOSIS — G8110 Spastic hemiplegia affecting unspecified side: Secondary | ICD-10-CM

## 2013-05-18 DIAGNOSIS — M6281 Muscle weakness (generalized): Secondary | ICD-10-CM | POA: Diagnosis not present

## 2013-05-18 DIAGNOSIS — I69959 Hemiplegia and hemiparesis following unspecified cerebrovascular disease affecting unspecified side: Secondary | ICD-10-CM | POA: Diagnosis not present

## 2013-05-18 NOTE — Assessment & Plan Note (Signed)
OT is working with pt's wrist contracture and querried whether there was any medication that might improve the situation. I discussed this with the pt. She is already on Baclofen. We can try robaxin, it may not do anything but there is no downside so Robaxin 500 mg TID was written.

## 2013-05-18 NOTE — Progress Notes (Signed)
MRN: [id number] Name: Chelsey Gallagher  Sex: female Age: 63 y.o. DOB: [birth year]  [place] #: Chelsey Gallagher Facility/Room: 126A Level Of Care: SNF Provider: Inocencio Homes D Emergency Contacts: Extended Emergency Contact Information Primary Emergency Contact: Chelsey Gallagher of [place] Phone: [phone] Mobile Phone: [phone] Relation: Daughter  Code Status: DNR  Allergies: Codeine; Flexeril; and Lyrica  Chief Complaint  Patient presents with  . Acute Visit    HPI: Patient is 63 y.o. female whose OT is working with her R wrist contracture and has a concern.  Past Medical History  Diagnosis Date  . Diabetes mellitus   . Hypertension   . PAD (peripheral artery disease)     S/p bypass grafting, unclear exactly where  . Nephrolithiasis   . Hyperlipidemia 05/14/2011  . Hearing loss   . Complication of anesthesia     doesn't wake up good- "usually end up in ICU" also experiences post op nausea and vomiting  . Orthostatic hypotension     after surgery.  Marland Kitchen Heart murmur     PCP- [facility]- No tx needed.  . Stroke 05/2011    Weakness on Right. Wears leg brace.  . Foot drop, left   . H/O hiatal hernia     second one  . Diabetic neuropathy     Diabetic neuropathy- has inproved since she quit smoking.  . Arthritis   . Depression     Past Surgical History  Procedure Laterality Date  . Total hip arthroplasty  2008    Right  . Abdominal hysterectomy  2003  . Cesarean section  1984  . Cholecystectomy  1984  . Bypass graft  2003    Abdominal aortic  . Kidney stone surgery  1999  . Appendectomy  2011  . Hernia repair  2011  . Lithotripsy    . Abdominal hysterectomy    . Esophagogastroduodenoscopy N/A 05/09/2013    Procedure: ESOPHAGOGASTRODUODENOSCOPY (EGD);  Surgeon: Lafayette Dragon, MD;  Location: [facility] ENDOSCOPY;  Service: Endoscopy;  Laterality: N/A;  . Colonoscopy N/A 05/10/2013    Procedure: COLONOSCOPY;  Surgeon: Lafayette Dragon, MD;  Location:  [facility] ENDOSCOPY;  Service: Endoscopy;  Laterality: N/A;      Medication List       This list is accurate as of: 05/18/13  2:02 PM.  Always use your most recent med list.               acetaminophen 325 MG tablet  Commonly known as:  TYLENOL  Take 650 mg by mouth every 6 (six) hours as needed for pain.     aspirin EC 81 MG tablet  Take 81 mg by mouth daily.     baclofen 20 MG tablet  Commonly known as:  LIORESAL  Take 20 mg by mouth at bedtime.     baclofen 10 MG tablet  Commonly known as:  LIORESAL  Take 5 mg by mouth 2 (two) times daily. 5 mg at 8a and 4pm     BENEFIBER Powd  Take 1 Applicatorful by mouth daily. Mix with 8oz of fluid     BIOFREEZE 4 % Gel  Generic drug:  Menthol (Topical Analgesic)  Apply 1 application topically every 6 (six) hours as needed (pain).     cholecalciferol 1000 UNITS tablet  Commonly known as:  VITAMIN D  Take 1,000 Units by mouth daily.     clopidogrel 75 MG tablet  Commonly known as:  PLAVIX  Take 75 mg by mouth daily.  DULoxetine 30 MG capsule  Commonly known as:  CYMBALTA  Take 60 mg by mouth daily.     ferrous sulfate 325 (65 FE) MG tablet  Take 325 mg by mouth 3 (three) times daily.     gabapentin 300 MG capsule  Commonly known as:  NEURONTIN  Take 300 mg by mouth at bedtime.     insulin glargine 100 UNIT/ML injection  Commonly known as:  LANTUS  Inject 12 Units into the skin at bedtime.     LINZESS 145 MCG Caps capsule  Generic drug:  Linaclotide  Take 145 mcg by mouth daily.     magnesium hydroxide 800 MG/5ML suspension  Commonly known as:  MILK OF MAGNESIA  Take 30 mLs by mouth daily as needed for constipation. 30cc     menthol-cetylpyridinium 3 MG lozenge  Commonly known as:  CEPACOL  Take 1 lozenge by mouth every 4 (four) hours as needed for sore throat.     metFORMIN 500 MG tablet  Commonly known as:  GLUCOPHAGE  Take 1,000 mg by mouth 2 (two) times daily with a meal.     midodrine 5 MG tablet   Commonly known as:  PROAMATINE  Take 5 mg by mouth 3 (three) times daily.     polyethylene glycol packet  Commonly known as:  MIRALAX / GLYCOLAX  Take 17 g by mouth daily as needed for mild constipation.     pravastatin 40 MG tablet  Commonly known as:  PRAVACHOL  Take 40 mg by mouth daily.     sennosides-docusate sodium 8.6-50 MG tablet  Commonly known as:  SENOKOT-S  Take 1 tablet by mouth 2 (two) times daily.     traMADol 100 MG 24 hr tablet  Commonly known as:  ULTRAM-ER  Take 100 mg by mouth every 8 (eight) hours as needed for pain.        No orders of the defined types were placed in this encounter.    Immunization History  Administered Date(s) Administered  . Influenza-Unspecified 12/04/2011  . Pneumococcal Polysaccharide-23 09/03/2011  . Pneumococcal-Unspecified 12/02/2010    History  Substance Use Topics  . Smoking status: Former Smoker -- 0.00 packs/day for 45 years    Types: Cigarettes  . Smokeless tobacco: Former Systems developer    Quit date: 05/08/2011  . Alcohol Use: No    Review of Systems  DATA OBTAINED: from patient, OT GENERAL: Feels well no fevers, fatigue, appetite changes SKIN: No itching, rash HEENT: No complaint RESPIRATORY: No cough, wheezing, SOB CARDIAC: No chest pain, palpitations, lower extremity edema  GI: No abdominal pain, No N/V/D or constipation, No heartburn or reflux  GU: No dysuria, frequency or urgency, or incontinence  MUSCULOSKELETAL: No unrelieved bone/joint pain; pt denies spasms in wrist after stretching NEUROLOGIC: No headache, dizziness PSYCHIATRIC: No overt anxiety or sadness. Sleeps well.   Filed Vitals:   05/18/13 1353  BP: 151/81  Pulse: 99  Temp: 97.3 F (36.3 C)  Resp: 18    Physical Exam  GENERAL APPEARANCE: Alert, conversant. Appropriately groomed. No acute distress  SKIN: No diaphoresis rash, or wounds HEENT: Unremarkable RESPIRATORY: Breathing is even, unlabored. Lung sounds are clear   CARDIOVASCULAR:  Heart RRR no murmurs, rubs or gallops. No peripheral edema  GASTROINTESTINAL: Abdomen is soft, non-tender, not distended w/ normal bowel sounds.  GENITOURINARY: Bladder non tender, not distended  MUSCULOSKELETAL: R wrist contracture in flexion NEUROLOGIC: Cranial nerves 2-12 grossly intact. PSYCHIATRIC: Mood and affect appropriate to situation, no behavioral issues  Patient Active Problem List   Diagnosis Date Noted  . Depression   . Hypotension 05/08/2013  . Fecal occult blood test positive 05/08/2013  . Altered mental status 05/08/2013  . Insomnia 04/26/2013  . UTI (urinary tract infection) 07/02/2012  . Anemia, iron deficiency 07/02/2012  . Ingrown toenail 07/02/2012  . Unspecified constipation 07/02/2012  . 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 Unspecified cerebral artery occlusion with cerebral infarction 04/13/2012  . Orthostatic hypotension 04/06/2012  . Abnormal TSH 04/06/2012  . Spastic hemiplegia affecting dominant side 07/25/2011  . Stroke 05/15/2011  . Hyperlipidemia 05/14/2011  . Tobacco abuse 05/14/2011  . 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 Lung abnormality 05/11/2011  . CVA (cerebral infarction) 05/08/2011  . Hypertension   . Type II or unspecified type diabetes mellitus with neurological manifestations, uncontrolled(250.62)     CBC    Component Value Date/Time   WBC 6.0 05/09/2013 0815   RBC 3.68* 05/09/2013 0815   RBC 2.99* 04/05/2012 0345   HGB 9.6* 05/09/2013 0815   HCT 31.7* 05/09/2013 0815   PLT 247 05/09/2013 0815   MCV 86.1 05/09/2013 0815   LYMPHSABS 2.0 05/07/2013 2223   MONOABS 0.6 05/07/2013 2223   EOSABS 0.1 05/07/2013 2223   BASOSABS 0.0 05/07/2013 2223    CMP     Component Value Date/Time   NA 140 05/09/2013 0815   K 4.3 05/09/2013 0815   CL 104 05/09/2013 0815   CO2 23 05/09/2013 0815   GLUCOSE 136* 05/09/2013 0815   BUN 11 05/09/2013 0815   CREATININE 0.63 05/09/2013 0815   CALCIUM 10.0 05/09/2013 0815   PROT 6.6  05/07/2013 2223   ALBUMIN 3.3* 05/07/2013 2223   AST 32 05/07/2013 2223   ALT 20 05/07/2013 2223   ALKPHOS 58 05/07/2013 2223   BILITOT 0.2* 05/07/2013 2223   GFRNONAA >90 05/09/2013 0815   GFRAA >90 05/09/2013 0815    Assessment and Plan  Spastic hemiplegia affecting dominant side OT is working with pt's wrist contracture and querried whether there was any medication that might improve the situation. I discussed this with the pt. She is already on Baclofen. We can try robaxin, it may not do anything but there is no downside so Robaxin 500 mg TID was written.    Hennie Duos, MD

## 2013-05-19 DIAGNOSIS — I69959 Hemiplegia and hemiparesis following unspecified cerebrovascular disease affecting unspecified side: Secondary | ICD-10-CM | POA: Diagnosis not present

## 2013-05-19 DIAGNOSIS — M6281 Muscle weakness (generalized): Secondary | ICD-10-CM | POA: Diagnosis not present

## 2013-05-20 DIAGNOSIS — M6281 Muscle weakness (generalized): Secondary | ICD-10-CM | POA: Diagnosis not present

## 2013-05-20 DIAGNOSIS — I69959 Hemiplegia and hemiparesis following unspecified cerebrovascular disease affecting unspecified side: Secondary | ICD-10-CM | POA: Diagnosis not present

## 2013-05-23 DIAGNOSIS — I69959 Hemiplegia and hemiparesis following unspecified cerebrovascular disease affecting unspecified side: Secondary | ICD-10-CM | POA: Diagnosis not present

## 2013-05-23 DIAGNOSIS — M6281 Muscle weakness (generalized): Secondary | ICD-10-CM | POA: Diagnosis not present

## 2013-05-24 DIAGNOSIS — M6281 Muscle weakness (generalized): Secondary | ICD-10-CM | POA: Diagnosis not present

## 2013-05-24 DIAGNOSIS — I69959 Hemiplegia and hemiparesis following unspecified cerebrovascular disease affecting unspecified side: Secondary | ICD-10-CM | POA: Diagnosis not present

## 2013-05-25 DIAGNOSIS — M6281 Muscle weakness (generalized): Secondary | ICD-10-CM | POA: Diagnosis not present

## 2013-05-25 DIAGNOSIS — I69959 Hemiplegia and hemiparesis following unspecified cerebrovascular disease affecting unspecified side: Secondary | ICD-10-CM | POA: Diagnosis not present

## 2013-05-26 DIAGNOSIS — M6281 Muscle weakness (generalized): Secondary | ICD-10-CM | POA: Diagnosis not present

## 2013-05-26 DIAGNOSIS — I69959 Hemiplegia and hemiparesis following unspecified cerebrovascular disease affecting unspecified side: Secondary | ICD-10-CM | POA: Diagnosis not present

## 2013-05-27 DIAGNOSIS — M6281 Muscle weakness (generalized): Secondary | ICD-10-CM | POA: Diagnosis not present

## 2013-05-27 DIAGNOSIS — I69959 Hemiplegia and hemiparesis following unspecified cerebrovascular disease affecting unspecified side: Secondary | ICD-10-CM | POA: Diagnosis not present

## 2013-05-30 ENCOUNTER — Non-Acute Institutional Stay (SKILLED_NURSING_FACILITY): Payer: Medicare Other | Admitting: Internal Medicine

## 2013-05-30 ENCOUNTER — Encounter: Payer: Self-pay | Admitting: Internal Medicine

## 2013-05-30 DIAGNOSIS — D509 Iron deficiency anemia, unspecified: Secondary | ICD-10-CM

## 2013-05-30 DIAGNOSIS — I69959 Hemiplegia and hemiparesis following unspecified cerebrovascular disease affecting unspecified side: Secondary | ICD-10-CM | POA: Diagnosis not present

## 2013-05-30 DIAGNOSIS — M6281 Muscle weakness (generalized): Secondary | ICD-10-CM | POA: Diagnosis not present

## 2013-05-30 NOTE — Progress Notes (Signed)
MRN: [id number] Name: Chelsey Gallagher  Sex: female Age: 63 y.o. DOB: 02-Nov-1950  [place] #: [name] Facility/Room: 126a Level Of Care: SNF Provider: Inocencio Homes D Emergency Contacts: Extended Emergency Contact Information Primary Emergency Contact: [name] of [place] Phone: [phone] Mobile Phone: [phone] Relation: Daughter  Code Status: DNR  Allergies: Codeine; Flexeril; and Lyrica  Chief Complaint  Patient presents with  . Medical Managment of Chronic Issues    HPI: Patient is 63 y.o. female who is being seen today about concern over blood count.  Past Medical History  Diagnosis Date  . Diabetes mellitus   . Hypertension   . PAD (peripheral artery disease)     S/p bypass grafting, unclear exactly where  . Nephrolithiasis   . Hyperlipidemia 05/14/2011  . Hearing loss   . Complication of anesthesia     doesn't wake up good- "usually end up in ICU" also experiences post op nausea and vomiting  . Orthostatic hypotension     after surgery.  Marland Kitchen Heart murmur     PCP- [facility]- No tx needed.  . Stroke 05/2011    Weakness on Right. Wears leg brace.  . Foot drop, left   . H/O hiatal hernia     second one  . Diabetic neuropathy     Diabetic neuropathy- has inproved since she quit smoking.  . Arthritis   . Depression     Past Surgical History  Procedure Laterality Date  . Total hip arthroplasty  2008    Right  . Abdominal hysterectomy  2003  . Cesarean section  1984  . Cholecystectomy  1984  . Bypass graft  2003    Abdominal aortic  . Kidney stone surgery  1999  . Appendectomy  2011  . Hernia repair  2011  . Lithotripsy    . Abdominal hysterectomy    . Esophagogastroduodenoscopy N/A 05/09/2013    Procedure: ESOPHAGOGASTRODUODENOSCOPY (EGD);  Surgeon: Lafayette Dragon, MD;  Location: [facility] ENDOSCOPY;  Service: Endoscopy;  Laterality: N/A;  . Colonoscopy N/A 05/10/2013    Procedure: COLONOSCOPY;  Surgeon: Lafayette Dragon, MD;   Location: [facility] ENDOSCOPY;  Service: Endoscopy;  Laterality: N/A;      Medication List       This list is accurate as of: 05/30/13  5:10 PM.  Always use your most recent med list.               acetaminophen 325 MG tablet  Commonly known as:  TYLENOL  Take 650 mg by mouth every 6 (six) hours as needed for pain.     aspirin EC 81 MG tablet  Take 81 mg by mouth daily.     baclofen 20 MG tablet  Commonly known as:  LIORESAL  Take 20 mg by mouth at bedtime.     baclofen 10 MG tablet  Commonly known as:  LIORESAL  Take 5 mg by mouth 2 (two) times daily. 5 mg at 8a and 4pm     BENEFIBER Powd  Take 1 Applicatorful by mouth daily. Mix with 8oz of fluid     BIOFREEZE 4 % Gel  Generic drug:  Menthol (Topical Analgesic)  Apply 1 application topically every 6 (six) hours as needed (pain).     cholecalciferol 1000 UNITS tablet  Commonly known as:  VITAMIN D  Take 1,000 Units by mouth daily.     clopidogrel 75 MG tablet  Commonly known as:  PLAVIX  Take 75 mg by mouth daily.  DULoxetine 30 MG capsule  Commonly known as:  CYMBALTA  Take 60 mg by mouth daily.     ferrous sulfate 325 (65 FE) MG tablet  Take 325 mg by mouth 3 (three) times daily.     gabapentin 300 MG capsule  Commonly known as:  NEURONTIN  Take 300 mg by mouth at bedtime.     insulin glargine 100 UNIT/ML injection  Commonly known as:  LANTUS  Inject 12 Units into the skin at bedtime.     LINZESS 145 MCG Caps capsule  Generic drug:  Linaclotide  Take 145 mcg by mouth daily.     magnesium hydroxide 800 MG/5ML suspension  Commonly known as:  MILK OF MAGNESIA  Take 30 mLs by mouth daily as needed for constipation. 30cc     menthol-cetylpyridinium 3 MG lozenge  Commonly known as:  CEPACOL  Take 1 lozenge by mouth every 4 (four) hours as needed for sore throat.     metFORMIN 500 MG tablet  Commonly known as:  GLUCOPHAGE  Take 1,000 mg by mouth 2 (two) times daily with a meal.     midodrine 5 MG  tablet  Commonly known as:  PROAMATINE  Take 5 mg by mouth 3 (three) times daily.     polyethylene glycol packet  Commonly known as:  MIRALAX / GLYCOLAX  Take 17 g by mouth daily as needed for mild constipation.     pravastatin 40 MG tablet  Commonly known as:  PRAVACHOL  Take 40 mg by mouth daily.     sennosides-docusate sodium 8.6-50 MG tablet  Commonly known as:  SENOKOT-S  Take 1 tablet by mouth 2 (two) times daily.     traMADol 100 MG 24 hr tablet  Commonly known as:  ULTRAM-ER  Take 100 mg by mouth every 8 (eight) hours as needed for pain.        No orders of the defined types were placed in this encounter.    Immunization History  Administered Date(s) Administered  . Influenza-Unspecified 12/04/2011  . Pneumococcal Polysaccharide-23 09/03/2011  . Pneumococcal-Unspecified 12/02/2010    History  Substance Use Topics  . Smoking status: Former Smoker -- 0.00 packs/day for 45 years    Types: Cigarettes  . Smokeless tobacco: Former Systems developer    Quit date: 05/08/2011  . Alcohol Use: No    Review of Systems  DATA OBTAINED: from patien GENERAL: Feels well no fevers, fatigue, appetite changes SKIN: No itching, rash HEENT: No complaint RESPIRATORY: No cough, wheezing, SOB CARDIAC: No chest pain, palpitations, lower extremity edema  GI: No abdominal pain, No N/V/D or constipation, No heartburn or reflux  GU: No dysuria, frequency or urgency, or incontinence  MUSCULOSKELETAL: No unrelieved bone/joint pain NEUROLOGIC: No headache, dizziness PSYCHIATRIC: No overt anxiety or sadness. Sleeps well.   Filed Vitals:   05/30/13 1705  BP: 129/78  Pulse: 106  Temp: 98.9 F (37.2 C)  Resp: 20    Physical Exam  GENERAL APPEARANCE: Alert, conversant. Appropriately groomed. No acute distress  SKIN: No diaphoresis rash, or wounds HEENT: Unremarkable RESPIRATORY: Breathing is even, unlabored. Lung sounds are clear   CARDIOVASCULAR: Heart RRR no murmurs, rubs or gallops. No  peripheral edema  GASTROINTESTINAL: Abdomen is soft, non-tender, not distended w/ normal bowel sounds.  GENITOURINARY: Bladder non tender, not distended  MUSCULOSKELETAL: No abnormal joints or musculature NEUROLOGIC: Cranial nerves 2-12 grossly intact PSYCHIATRIC: Mood and affect appropriate to situation, no behavioral issues  Patient Active Problem List   Diagnosis Date  Noted  . Depression   . Hypotension 05/08/2013  . Fecal occult blood test positive 05/08/2013  . Altered mental status 05/08/2013  . Insomnia 04/26/2013  . UTI (urinary tract infection) 07/02/2012  . Anemia, iron deficiency 07/02/2012  . Ingrown toenail 07/02/2012  . Unspecified constipation 07/02/2012  . 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 Unspecified cerebral artery occlusion with cerebral infarction 04/13/2012  . Orthostatic hypotension 04/06/2012  . Abnormal TSH 04/06/2012  . Spastic hemiplegia affecting dominant side 07/25/2011  . Stroke 05/15/2011  . Hyperlipidemia 05/14/2011  . Tobacco abuse 05/14/2011  . 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 Lung abnormality 05/11/2011  . CVA (cerebral infarction) 05/08/2011  . Hypertension   . Type II or unspecified type diabetes mellitus with neurological manifestations, uncontrolled(250.62)     CBC    Component Value Date/Time   WBC 6.0 05/09/2013 0815   RBC 3.68* 05/09/2013 0815   RBC 2.99* 04/05/2012 0345   HGB 9.6* 05/09/2013 0815   HCT 31.7* 05/09/2013 0815   PLT 247 05/09/2013 0815   MCV 86.1 05/09/2013 0815   LYMPHSABS 2.0 05/07/2013 2223   MONOABS 0.6 05/07/2013 2223   EOSABS 0.1 05/07/2013 2223   BASOSABS 0.0 05/07/2013 2223    CMP     Component Value Date/Time   NA 140 05/09/2013 0815   K 4.3 05/09/2013 0815   CL 104 05/09/2013 0815   CO2 23 05/09/2013 0815   GLUCOSE 136* 05/09/2013 0815   BUN 11 05/09/2013 0815   CREATININE 0.63 05/09/2013 0815   CALCIUM 10.0 05/09/2013 0815   PROT 6.6 05/07/2013 2223   ALBUMIN 3.3* 05/07/2013  2223   AST 32 05/07/2013 2223   ALT 20 05/07/2013 2223   ALKPHOS 58 05/07/2013 2223   BILITOT 0.2* 05/07/2013 2223   GFRNONAA >90 05/09/2013 0815   GFRAA >90 05/09/2013 0815    Assessment and Plan  Anemia, iron deficiency 3/16 HB was 10-up from  9.6 on 3/9; pt has concern so will recheck again.    Hennie Duos, MD

## 2013-05-30 NOTE — Assessment & Plan Note (Signed)
3/16 HB was 10-up from  9.6 on 3/9; pt has concern so will recheck again.

## 2013-06-02 DIAGNOSIS — E785 Hyperlipidemia, unspecified: Secondary | ICD-10-CM | POA: Diagnosis not present

## 2013-06-06 DIAGNOSIS — E1142 Type 2 diabetes mellitus with diabetic polyneuropathy: Secondary | ICD-10-CM | POA: Diagnosis not present

## 2013-06-06 DIAGNOSIS — I10 Essential (primary) hypertension: Secondary | ICD-10-CM | POA: Diagnosis not present

## 2013-06-06 DIAGNOSIS — E1129 Type 2 diabetes mellitus with other diabetic kidney complication: Secondary | ICD-10-CM | POA: Diagnosis not present

## 2013-06-06 DIAGNOSIS — E785 Hyperlipidemia, unspecified: Secondary | ICD-10-CM | POA: Diagnosis not present

## 2013-06-06 DIAGNOSIS — N200 Calculus of kidney: Secondary | ICD-10-CM | POA: Diagnosis not present

## 2013-06-10 ENCOUNTER — Non-Acute Institutional Stay (SKILLED_NURSING_FACILITY): Payer: Medicare Other | Admitting: Nurse Practitioner

## 2013-06-10 DIAGNOSIS — I951 Orthostatic hypotension: Secondary | ICD-10-CM | POA: Diagnosis not present

## 2013-06-10 DIAGNOSIS — K5900 Constipation, unspecified: Secondary | ICD-10-CM

## 2013-06-10 DIAGNOSIS — F32A Depression, unspecified: Secondary | ICD-10-CM

## 2013-06-10 DIAGNOSIS — F329 Major depressive disorder, single episode, unspecified: Secondary | ICD-10-CM

## 2013-06-10 DIAGNOSIS — F3289 Other specified depressive episodes: Secondary | ICD-10-CM

## 2013-06-10 DIAGNOSIS — E1149 Type 2 diabetes mellitus with other diabetic neurological complication: Secondary | ICD-10-CM

## 2013-06-10 DIAGNOSIS — N390 Urinary tract infection, site not specified: Secondary | ICD-10-CM

## 2013-06-10 DIAGNOSIS — I639 Cerebral infarction, unspecified: Secondary | ICD-10-CM

## 2013-06-10 DIAGNOSIS — G8110 Spastic hemiplegia affecting unspecified side: Secondary | ICD-10-CM

## 2013-06-10 DIAGNOSIS — D509 Iron deficiency anemia, unspecified: Secondary | ICD-10-CM

## 2013-06-10 DIAGNOSIS — I6350 Cerebral infarction due to unspecified occlusion or stenosis of unspecified cerebral artery: Secondary | ICD-10-CM

## 2013-06-10 NOTE — Progress Notes (Signed)
Patient ID: Chelsey Gallagher, female   DOB: 10-21-1950, 63 y.o.   MRN: [id number]    Nursing Home Location:  [place] of Service: SNF (31)  PCP: Hennie Duos, MD  Allergies  Allergen Reactions  . Codeine Other (See Comments)    Per Mar  . Flexeril [Cyclobenzaprine Hcl] Other (See Comments)    Per Mar  . Lyrica [Pregabalin] Other (See Comments)    "wires my up"    Chief Complaint  Patient presents with  . Medical Managment of Chronic Issues    HPI:  63 year old female with a pmh of CVA with spastic hemiplegia, constipation, anxiety, DM, HTN, and hyperlipidemia is being seen today for routine follow up on chronic conditions Pt was hospitalized last month due to AMS and hypotension, pt was found to have hem positive stools but colonoscopy did not reveal anything significant, hgb has improved since she has been on iron and she did not require a transfusion  Reassessment of ongoing issues:  Anemia- improved on iron   Constipation- currently stable Anxiety with depression-  Has improved since on cymbalta 60 mg daily  DM- stabe; taking metformin 1000 mg BID and lantus 12 units qhs  Hyperlipidemia- tolerating pravastatin without side effects  Spastic hemiplegia-pt is on gabapentin and baclofen --was not improving and due to contractures in right hand was started on robaxin, this has only helped some at this time  Review of Systems:  Review of Systems  Constitutional: Negative for malaise/fatigue.  HENT: Negative for congestion and sore throat.   Eyes: Negative for blurred vision.  Respiratory: Negative for cough, sputum production and shortness of breath.   Cardiovascular: Negative for chest pain, palpitations and leg swelling.  Gastrointestinal: Negative for heartburn, abdominal pain, diarrhea and constipation (improved on current medications ).  Genitourinary: Positive for dysuria, urgency and frequency.  Musculoskeletal: Positive for myalgias (legs  and right arm and hand).  Skin: Negative.   Neurological: Positive for dizziness (occasionally). Negative for tingling, sensory change, speech change, weakness and headaches.  Psychiatric/Behavioral: Negative for depression (improved on medications). The patient does not have insomnia.      Past Medical History  Diagnosis Date  . Diabetes mellitus   . Hypertension   . PAD (peripheral artery disease)     S/p bypass grafting, unclear exactly where  . Nephrolithiasis   . Hyperlipidemia 05/14/2011  . Hearing loss   . Complication of anesthesia     doesn't wake up good- "usually end up in ICU" also experiences post op nausea and vomiting  . Orthostatic hypotension     after surgery.  Marland Kitchen Heart murmur     PCP- [facility]- No tx needed.  . Stroke 05/2011    Weakness on Right. Wears leg brace.  . Foot drop, left   . H/O hiatal hernia     second one  . Diabetic neuropathy     Diabetic neuropathy- has inproved since she quit smoking.  . Arthritis   . Depression    Past Surgical History  Procedure Laterality Date  . Total hip arthroplasty  2008    Right  . Abdominal hysterectomy  2003  . Cesarean section  1984  . Cholecystectomy  1984  . Bypass graft  2003    Abdominal aortic  . Kidney stone surgery  1999  . Appendectomy  2011  . Hernia repair  2011  . Lithotripsy    . Abdominal hysterectomy    .  Esophagogastroduodenoscopy N/A 05/09/2013    Procedure: ESOPHAGOGASTRODUODENOSCOPY (EGD);  Surgeon: Lafayette Dragon, MD;  Location: [facility] ENDOSCOPY;  Service: Endoscopy;  Laterality: N/A;  . Colonoscopy N/A 05/10/2013    Procedure: COLONOSCOPY;  Surgeon: Lafayette Dragon, MD;  Location: [facility] ENDOSCOPY;  Service: Endoscopy;  Laterality: N/A;   Social History:   reports that she has quit smoking. Her smoking use included Cigarettes. She smoked 0.00 packs per day for 45 years. She quit smokeless tobacco use about 2 years ago. She reports that she does not drink alcohol or use illicit  drugs.  Family History  Problem Relation Age of Onset  . Cancer Maternal Aunt     breast    Medications: Patient's Medications  New Prescriptions   No medications on file  Previous Medications   ACETAMINOPHEN (TYLENOL) 325 MG TABLET    Take 650 mg by mouth every 6 (six) hours as needed for pain.   ASPIRIN EC 81 MG TABLET    Take 81 mg by mouth daily.   BACLOFEN (LIORESAL) 10 MG TABLET    Take 5 mg by mouth 2 (two) times daily. 5 mg at 8a and 4pm   BACLOFEN (LIORESAL) 20 MG TABLET    Take 20 mg by mouth at bedtime.   CHOLECALCIFEROL (VITAMIN D) 1000 UNITS TABLET    Take 1,000 Units by mouth daily.   CLOPIDOGREL (PLAVIX) 75 MG TABLET    Take 75 mg by mouth daily.   DULOXETINE (CYMBALTA) 30 MG CAPSULE    Take 60 mg by mouth daily.   FERROUS SULFATE 325 (65 FE) MG TABLET    Take 325 mg by mouth 3 (three) times daily.    GABAPENTIN (NEURONTIN) 300 MG CAPSULE    Take 300 mg by mouth at bedtime.   INSULIN GLARGINE (LANTUS) 100 UNIT/ML INJECTION    Inject 12 Units into the skin at bedtime.    LINACLOTIDE (LINZESS) 145 MCG CAPS CAPSULE    Take 145 mcg by mouth daily.   MAGNESIUM HYDROXIDE (MILK OF MAGNESIA) 800 MG/5ML SUSPENSION    Take 30 mLs by mouth daily as needed for constipation. 30cc   MENTHOL, TOPICAL ANALGESIC, (BIOFREEZE) 4 % GEL    Apply 1 application topically every 6 (six) hours as needed (pain).   MENTHOL-CETYLPYRIDINIUM (CEPACOL) 3 MG LOZENGE    Take 1 lozenge by mouth every 4 (four) hours as needed for sore throat.   METFORMIN (GLUCOPHAGE) 500 MG TABLET    Take 1,000 mg by mouth 2 (two) times daily with a meal.    METHOCARBAMOL (ROBAXIN) 500 MG TABLET    Take 500 mg by mouth 3 (three) times daily.   MIDODRINE (PROAMATINE) 5 MG TABLET    Take 5 mg by mouth 3 (three) times daily.   POLYETHYLENE GLYCOL (MIRALAX / GLYCOLAX) PACKET    Take 17 g by mouth daily as needed for mild constipation.    PRAVASTATIN (PRAVACHOL) 40 MG TABLET    Take 40 mg by mouth daily.    SENNOSIDES-DOCUSATE  SODIUM (SENOKOT-S) 8.6-50 MG TABLET    Take 1 tablet by mouth 2 (two) times daily.   TRAMADOL (ULTRAM-ER) 100 MG 24 HR TABLET    Take 100 mg by mouth every 8 (eight) hours as needed for pain.   WHEAT DEXTRIN (BENEFIBER) POWD    Take 1 Applicatorful by mouth daily. Mix with 8oz of fluid  Modified Medications   No medications on file  Discontinued Medications   No medications on file  Physical Exam:  Filed Vitals:   06/10/13 1534  BP: 126/80  Pulse: 98  Temp: 98.1 F (36.7 C)  Resp: 20     Physical Exam  Vitals reviewed. Constitutional: She is oriented to person, place, and time and well-developed, well-nourished, and in no distress. No distress.  HENT:  Head: Normocephalic and atraumatic.  Right Ear: External ear normal.  Left Ear: External ear normal.  Nose: Nose normal.  Mouth/Throat: Oropharynx is clear and moist.  Eyes: Conjunctivae and EOM are normal. Pupils are equal, round, and reactive to light.  Neck: Normal range of motion. Neck supple.  Cardiovascular: Normal rate and regular rhythm.   Murmur heard. Pulmonary/Chest: Effort normal and breath sounds normal. No respiratory distress. She has no wheezes.  Abdominal: Soft. Bowel sounds are normal. She exhibits no distension.  Musculoskeletal: She exhibits no edema and no tenderness.  Neurological: She is alert and oriented to person, place, and time.  Right sided hemiparesis with contracture to right wrist- in brace  Skin: Skin is warm and dry. She is not diaphoretic.  Psychiatric: Affect normal.    Labs reviewed: Basic Metabolic Panel:  Recent Labs  05/07/13 2223 05/09/13 0815  NA 132* 140  K 4.6 4.3  CL 94* 104  CO2 23 23  GLUCOSE 167* 136*  BUN 20 11  CREATININE 0.82 0.63  CALCIUM 9.8 10.0   Liver Function Tests:  Recent Labs  05/07/13 2223  AST 32  ALT 20  ALKPHOS 58  BILITOT 0.2*  PROT 6.6  ALBUMIN 3.3*   No results found for this basename: LIPASE, AMYLASE,  in the last 8760  hours No results found for this basename: AMMONIA,  in the last 8760 hours CBC:  Recent Labs  05/07/13 2223 05/09/13 0815  WBC 8.1 6.0  NEUTROABS 5.4  --   HGB 8.6* 9.6*  HCT 27.9* 31.7*  MCV 86.6 86.1  PLT 253 247   Cardiac Enzymes:  Recent Labs  05/07/13 2227  TROPONINI <0.30   BNP: No components found with this basename: POCBNP,  CBG:  Recent Labs  05/10/13 0810 05/10/13 1206 05/10/13 1611  GLUCAP 130* 121* 215*   TSH: No results found for this basename: TSH,  in the last 8760 hours A1C: Lab Results  Component Value Date   HGBA1C 5.3 04/05/2012  CBC NO Diff (Complete Blood Count)    Result: 04/11/2013 4:52 PM   ( Status: F )     C WBC 6.6     4.0-10.5 K/uL SLN   RBC 2.44   L 4.22-5.81 MIL/uL SLN   Hemoglobin 5.6   LL 13.0-17.0 g/dL SLN C Hematocrit 18.7   L 39.0-52.0 % SLN   MCV 76.3   L 78.0-100.0 fL SLN   MCH 23.0   L 26.0-34.0 pg SLN   MCHC 30.5     30.0-36.0 g/dL SLN   RDW 17.5   H 11.5-15.5 % SLN   Platelet Count 243     150-400 K/uL SLN   Basic Metabolic Panel    Result: 04/11/2013 8:54 PM   ( Status: F )       Sodium 133   L 135-145 mEq/L SLN   Potassium 5.7   H 3.5-5.3 mEq/L SLN C Chloride 101     96-112 mEq/L SLN   CO2 26     19-32 mEq/L SLN   Glucose 93     70-99 mg/dL SLN   BUN 23     6-23 mg/dL SLN  Creatinine 0.78     0.50-1.35 mg/dL SLN   Calcium 9.3     8.4-10.5 mg/dL SLN   Lipid Profile    Result: 04/11/2013 8:54 PM   ( Status: F )       Cholesterol 118     0-200 mg/dL SLN C Triglyceride 119     <150 mg/dL SLN   HDL Cholesterol 34   L >39 mg/dL SLN   Total Chol/HDL Ratio 3.5      Ratio SLN   VLDL Cholesterol (Calc) 24     0-40 mg/dL SLN   LDL Cholesterol (Calc) 60     0-99 mg/dL SLN C Hemoglobin A1C    Result: 04/12/2013 2:17 PM   ( Status: F )       Hemoglobin A1C 5.5     <5.7 % SLN C Estimated Average Glucose 111     CBC with Diff    Result: 06/06/2013 3:02 PM   ( Status: F )     C WBC 6.1     4.0-10.5 K/uL SLN   RBC 3.83    L 3.87-5.11 MIL/uL SLN   Hemoglobin 10.4   L 12.0-15.0 g/dL SLN   Hematocrit 33.1   L 36.0-46.0 % SLN   MCV 86.4     78.0-100.0 fL SLN   MCH 27.2     26.0-34.0 pg SLN   MCHC 31.4     30.0-36.0 g/dL SLN   RDW 19.5   H 11.5-15.5 % SLN   Platelet Count 240     150-400 K/uL SLN   Granulocyte % 62     43-77 % SLN   Absolute Gran 3.8     1.7-7.7 K/uL SLN   Lymph % 27     12-46 % SLN   Absolute Lymph 1.6     0.7-4.0 K/uL SLN   Mono % 9     3-12 % SLN   Absolute Mono 0.5     0.1-1.0 K/uL SLN   Eos % 2     0-5 % SLN   Absolute Eos 0.1     0.0-0.7 K/uL SLN   Baso % 0     0-1 % SLN   Absolute Baso 0.0     0.0-0.1 K/uL SLN   Smear Review Criteria for review not met   Assessment/Plan 1. Orthostatic hypotension -conts on midodrine   2. Unspecified constipation -currently improved  3. Type II or unspecified type diabetes mellitus with neurological manifestations, uncontrolled(250.62) -stable; will cont current medications   4. Spastic hemiplegia affecting dominant side -will increase robaxin to 1000 mg TID to see if this improves pain and tightness in right hand  5. CVA (cerebral infarction) -conts on plavix and asa  6. UTI (urinary tract infection) -with dysuria, culture shows yeast, will treat with diflucan 100 mg for 5 days and have staff follow up UA C&S after treament  7. Depression -improved on cymbalta  8. Anemia, iron deficiency -improve hgb on iron

## 2013-06-17 DIAGNOSIS — N200 Calculus of kidney: Secondary | ICD-10-CM | POA: Diagnosis not present

## 2013-06-17 DIAGNOSIS — E1149 Type 2 diabetes mellitus with other diabetic neurological complication: Secondary | ICD-10-CM | POA: Diagnosis not present

## 2013-06-17 DIAGNOSIS — E785 Hyperlipidemia, unspecified: Secondary | ICD-10-CM | POA: Diagnosis not present

## 2013-07-08 ENCOUNTER — Non-Acute Institutional Stay (SKILLED_NURSING_FACILITY): Payer: Medicare Other | Admitting: Nurse Practitioner

## 2013-07-08 DIAGNOSIS — F32A Depression, unspecified: Secondary | ICD-10-CM

## 2013-07-08 DIAGNOSIS — F329 Major depressive disorder, single episode, unspecified: Secondary | ICD-10-CM

## 2013-07-08 DIAGNOSIS — G8110 Spastic hemiplegia affecting unspecified side: Secondary | ICD-10-CM | POA: Diagnosis not present

## 2013-07-08 DIAGNOSIS — E1149 Type 2 diabetes mellitus with other diabetic neurological complication: Secondary | ICD-10-CM

## 2013-07-08 DIAGNOSIS — F3289 Other specified depressive episodes: Secondary | ICD-10-CM

## 2013-07-08 DIAGNOSIS — K5900 Constipation, unspecified: Secondary | ICD-10-CM | POA: Diagnosis not present

## 2013-07-08 DIAGNOSIS — I951 Orthostatic hypotension: Secondary | ICD-10-CM | POA: Diagnosis not present

## 2013-07-08 DIAGNOSIS — D509 Iron deficiency anemia, unspecified: Secondary | ICD-10-CM

## 2013-07-08 DIAGNOSIS — I6990 Unspecified sequelae of unspecified cerebrovascular disease: Secondary | ICD-10-CM | POA: Insufficient documentation

## 2013-07-08 NOTE — Progress Notes (Signed)
Patient ID: Chelsey Gallagher, female   DOB: 16-Nov-1950, 63 y.o.   MRN: [id number]    Nursing Home Location:  [place] of Service: SNF (31)  PCP: Hennie Duos, MD  Allergies  Allergen Reactions  . Codeine Other (See Comments)    Per Mar  . Flexeril [Cyclobenzaprine Hcl] Other (See Comments)    Per Mar  . Lyrica [Pregabalin] Other (See Comments)    "wires my up"    Chief Complaint  Patient presents with  . Medical Management of Chronic Issues    HPI:  63 year old female with a pmh of CVA with spastic hemiplegia, constipation, anxiety, DM, HTN, and hyperlipidemia is being seen today for routine follow up on chronic conditions  Pt with on-going complaints regarading muscle spasms, reports this is some better on robaxin, taking bacolfen as well. Does not take medications routinely for constipation, often declining it when offered but sill having constipation frequently. Reports increase in urination, increased freq and some burning. No changes in this over the past months.   Review of Systems:  Review of Systems  Constitutional: Negative for weight loss and malaise/fatigue.  HENT: Negative for congestion and sore throat.   Eyes: Negative for blurred vision.  Respiratory: Negative for cough, sputum production and shortness of breath.   Cardiovascular: Negative for chest pain, palpitations and leg swelling.  Gastrointestinal: Negative for heartburn, abdominal pain, diarrhea and constipation (improved on current medications ).  Genitourinary: Positive for dysuria, urgency and frequency.  Musculoskeletal: Positive for myalgias (legs and right arm and hand).  Skin: Negative.   Neurological: Positive for dizziness (occasionally). Negative for tingling, sensory change, speech change, weakness and headaches.  Psychiatric/Behavioral: Negative for depression. The patient does not have insomnia.      Past Medical History  Diagnosis Date  . Diabetes mellitus    . Hypertension   . PAD (peripheral artery disease)     S/p bypass grafting, unclear exactly where  . Nephrolithiasis   . Hyperlipidemia 05/14/2011  . Hearing loss   . Complication of anesthesia     doesn't wake up good- "usually end up in ICU" also experiences post op nausea and vomiting  . Orthostatic hypotension     after surgery.  Marland Kitchen Heart murmur     PCP- [facility]- No tx needed.  . Stroke 05/2011    Weakness on Right. Wears leg brace.  . Foot drop, left   . H/O hiatal hernia     second one  . Diabetic neuropathy     Diabetic neuropathy- has inproved since she quit smoking.  . Arthritis   . Depression    Past Surgical History  Procedure Laterality Date  . Total hip arthroplasty  2008    Right  . Abdominal hysterectomy  2003  . Cesarean section  1984  . Cholecystectomy  1984  . Bypass graft  2003    Abdominal aortic  . Kidney stone surgery  1999  . Appendectomy  2011  . Hernia repair  2011  . Lithotripsy    . Abdominal hysterectomy    . Esophagogastroduodenoscopy N/A 05/09/2013    Procedure: ESOPHAGOGASTRODUODENOSCOPY (EGD);  Surgeon: Lafayette Dragon, MD;  Location: [facility] ENDOSCOPY;  Service: Endoscopy;  Laterality: N/A;  . Colonoscopy N/A 05/10/2013    Procedure: COLONOSCOPY;  Surgeon: Lafayette Dragon, MD;  Location: [facility] ENDOSCOPY;  Service: Endoscopy;  Laterality: N/A;   Social History:   reports that she has quit smoking.  Her smoking use included Cigarettes. She smoked 0.00 packs per day for 45 years. She quit smokeless tobacco use about 2 years ago. She reports that she does not drink alcohol or use illicit drugs.  Family History  Problem Relation Age of Onset  . Cancer Maternal Aunt     breast    Medications: Patient's Medications  New Prescriptions   No medications on file  Previous Medications   ACETAMINOPHEN (TYLENOL) 325 MG TABLET    Take 650 mg by mouth every 6 (six) hours as needed for pain.   ASPIRIN EC 81 MG TABLET    Take 81 mg by mouth daily.    BACLOFEN (LIORESAL) 10 MG TABLET    Take 5 mg by mouth 2 (two) times daily. 5 mg at 8a and 4pm   BACLOFEN (LIORESAL) 20 MG TABLET    Take 20 mg by mouth at bedtime.   CHOLECALCIFEROL (VITAMIN D) 1000 UNITS TABLET    Take 1,000 Units by mouth daily.   CLOPIDOGREL (PLAVIX) 75 MG TABLET    Take 75 mg by mouth daily.   DULOXETINE (CYMBALTA) 30 MG CAPSULE    Take 60 mg by mouth daily.   FERROUS SULFATE 325 (65 FE) MG TABLET    Take 325 mg by mouth 3 (three) times daily.    GABAPENTIN (NEURONTIN) 300 MG CAPSULE    Take 300 mg by mouth at bedtime.   INSULIN GLARGINE (LANTUS) 100 UNIT/ML INJECTION    Inject 12 Units into the skin at bedtime.    LINACLOTIDE (LINZESS) 145 MCG CAPS CAPSULE    Take 145 mcg by mouth daily.   LUBIPROSTONE (AMITIZA) 24 MCG CAPSULE    Take 24 mcg by mouth 2 (two) times daily with a meal.   MAGNESIUM HYDROXIDE (MILK OF MAGNESIA) 800 MG/5ML SUSPENSION    Take 30 mLs by mouth daily as needed for constipation. 30cc   MENTHOL, TOPICAL ANALGESIC, (BIOFREEZE) 4 % GEL    Apply 1 application topically every 6 (six) hours as needed (pain). To right ankle   MENTHOL-CETYLPYRIDINIUM (CEPACOL) 3 MG LOZENGE    Take 1 lozenge by mouth every 4 (four) hours as needed for sore throat.   METFORMIN (GLUCOPHAGE) 500 MG TABLET    Take 1,000 mg by mouth 2 (two) times daily with a meal.    METHOCARBAMOL (ROBAXIN) 500 MG TABLET    Take 500 mg by mouth 3 (three) times daily.   MIDODRINE (PROAMATINE) 5 MG TABLET    Take 5 mg by mouth 3 (three) times daily.   POLYETHYLENE GLYCOL (MIRALAX / GLYCOLAX) PACKET    Take 17 g by mouth daily as needed for mild constipation.    PRAVASTATIN (PRAVACHOL) 40 MG TABLET    Take 40 mg by mouth daily.    SENNOSIDES-DOCUSATE SODIUM (SENOKOT-S) 8.6-50 MG TABLET    Take 1 tablet by mouth 2 (two) times daily.   TRAMADOL (ULTRAM-ER) 100 MG 24 HR TABLET    Take 100 mg by mouth every 8 (eight) hours as needed for pain.   WHEAT DEXTRIN (BENEFIBER) POWD    Take 1 Applicatorful by  mouth daily. Mix with 8oz of fluid  Modified Medications   No medications on file  Discontinued Medications   No medications on file     Physical Exam:  Filed Vitals:   07/08/13 1536  BP: 121/73  Pulse: 88  Temp: 98.2 F (36.8 C)  Resp: 20  Weight: 148 lb (67.132 kg)  SpO2: 100%  Physical Exam  Vitals reviewed. Constitutional: She is oriented to person, place, and time and well-developed, well-nourished, and in no distress. No distress.  HENT:  Head: Normocephalic and atraumatic.  Right Ear: External ear normal.  Left Ear: External ear normal.  Nose: Nose normal.  Mouth/Throat: Oropharynx is clear and moist.  Eyes: Conjunctivae and EOM are normal. Pupils are equal, round, and reactive to light.  Neck: Normal range of motion. Neck supple.  Cardiovascular: Normal rate and regular rhythm.   Murmur heard. Pulmonary/Chest: Effort normal and breath sounds normal. No respiratory distress. She has no wheezes.  Abdominal: Soft. Bowel sounds are normal. She exhibits no distension.  Musculoskeletal: She exhibits no edema and no tenderness.  Neurological: She is alert and oriented to person, place, and time.  Right sided hemiparesis with contracture to right wrist- in brace, has brace to right ankle as well  Skin: Skin is warm and dry. She is not diaphoretic.  Psychiatric: Affect normal.       Labs reviewed: Basic Metabolic Panel:  Recent Labs  05/07/13 2223 05/09/13 0815  NA 132* 140  K 4.6 4.3  CL 94* 104  CO2 23 23  GLUCOSE 167* 136*  BUN 20 11  CREATININE 0.82 0.63  CALCIUM 9.8 10.0   Liver Function Tests:  Recent Labs  05/07/13 2223  AST 32  ALT 20  ALKPHOS 58  BILITOT 0.2*  PROT 6.6  ALBUMIN 3.3*   No results found for this basename: LIPASE, AMYLASE,  in the last 8760 hours No results found for this basename: AMMONIA,  in the last 8760 hours CBC:  Recent Labs  05/07/13 2223 05/09/13 0815  WBC 8.1 6.0  NEUTROABS 5.4  --   HGB 8.6* 9.6*  HCT  27.9* 31.7*  MCV 86.6 86.1  PLT 253 247   Cardiac Enzymes:  Recent Labs  05/07/13 2227  TROPONINI <0.30   BNP: No components found with this basename: POCBNP,  CBG:  Recent Labs  05/10/13 0810 05/10/13 1206 05/10/13 1611  GLUCAP 130* 121* 215*   TSH: No results found for this basename: TSH,  in the last 8760 hours A1C: Lab Results  Component Value Date   HGBA1C 5.3 04/05/2012   CBC NO Diff (Complete Blood Count)  Result: 04/11/2013 4:52 PM ( Status: F ) C  WBC 6.6 4.0-10.5 K/uL SLN  RBC 2.44 L 4.22-5.81 MIL/uL SLN  Hemoglobin 5.6 LL 13.0-17.0 g/dL SLN C  Hematocrit 18.7 L 39.0-52.0 % SLN  MCV 76.3 L 78.0-100.0 fL SLN  MCH 23.0 L 26.0-34.0 pg SLN  MCHC 30.5 30.0-36.0 g/dL SLN  RDW 17.5 H 11.5-15.5 % SLN  Platelet Count 243 150-400 K/uL SLN  Basic Metabolic Panel  Result: 7/0/3500 8:54 PM ( Status: F )  Sodium 133 L 135-145 mEq/L SLN  Potassium 5.7 H 3.5-5.3 mEq/L SLN C  Chloride 101 96-112 mEq/L SLN  CO2 26 19-32 mEq/L SLN  Glucose 93 70-99 mg/dL SLN  BUN 23 6-23 mg/dL SLN  Creatinine 0.78 0.50-1.35 mg/dL SLN  Calcium 9.3 8.4-10.5 mg/dL SLN  Lipid Profile  Result: 04/11/2013 8:54 PM ( Status: F )  Cholesterol 118 0-200 mg/dL SLN C  Triglyceride 119 <150 mg/dL SLN  HDL Cholesterol 34 L >39 mg/dL SLN  Total Chol/HDL Ratio 3.5 Ratio SLN  VLDL Cholesterol (Calc) 24 0-40 mg/dL SLN  LDL Cholesterol (Calc) 60 0-99 mg/dL SLN C  Hemoglobin A1C  Result: 04/12/2013 2:17 PM ( Status: F )  Hemoglobin A1C 5.5 <5.7 % SLN C  Estimated Average  Glucose 111  CBC with Diff  Result: 06/06/2013 3:02 PM ( Status: F ) C  WBC 6.1 4.0-10.5 K/uL SLN  RBC 3.83 L 3.87-5.11 MIL/uL SLN  Hemoglobin 10.4 L 12.0-15.0 g/dL SLN  Hematocrit 33.1 L 36.0-46.0 % SLN  MCV 86.4 78.0-100.0 fL SLN  MCH 27.2 26.0-34.0 pg SLN  MCHC 31.4 30.0-36.0 g/dL SLN  RDW 19.5 H 11.5-15.5 % SLN  Platelet Count 240 150-400 K/uL SLN  Granulocyte % 62 43-77 % SLN  Absolute Gran 3.8 1.7-7.7 K/uL SLN  Lymph % 27  12-46 % SLN  Absolute Lymph 1.6 0.7-4.0 K/uL SLN  Mono % 9 3-12 % SLN  Absolute Mono 0.5 0.1-1.0 K/uL SLN  Eos % 2 0-5 % SLN  Absolute Eos 0.1 0.0-0.7 K/uL SLN  Baso % 0 0-1 % SLN  Absolute Baso 0.0 0.0-0.1 K/uL SLN  Smear Review Criteria for review not met      Assessment/Plan 1. Type II or unspecified type diabetes mellitus with neurological manifestations, uncontrolled(250.62) -blood sugars reviewed and well controlled, will follow up A1c   2. Unspecified constipation -educated to take medication as prescribed for best results -amitiza was added due to lizness not being on formulary   3. Orthostatic hypotension -remains stable  4. Spastic hemiplegia affecting dominant side -robaxin added last month, with SOME improvement, conts to have issues with muscle spasm  -conts on bacolfen  5. Anemia, iron deficiency -has decreased iron to once daily due to constipation -will follow up cbc, educated on need to take iron  6. Depression -stable on current medications  7. Late effects of CVA (cerebrovascular accident) -conts on ASA  8. Overactive bladder -with ongoing complaints of frequency -UA C&S was done without UTI -will start on vesicare 5 mg daily for OAB

## 2013-07-13 DIAGNOSIS — Z7982 Long term (current) use of aspirin: Secondary | ICD-10-CM | POA: Diagnosis not present

## 2013-07-13 DIAGNOSIS — D649 Anemia, unspecified: Secondary | ICD-10-CM | POA: Diagnosis not present

## 2013-07-13 DIAGNOSIS — I739 Peripheral vascular disease, unspecified: Secondary | ICD-10-CM | POA: Diagnosis not present

## 2013-07-13 DIAGNOSIS — E1149 Type 2 diabetes mellitus with other diabetic neurological complication: Secondary | ICD-10-CM | POA: Diagnosis not present

## 2013-07-13 DIAGNOSIS — E785 Hyperlipidemia, unspecified: Secondary | ICD-10-CM | POA: Diagnosis not present

## 2013-07-13 DIAGNOSIS — E1129 Type 2 diabetes mellitus with other diabetic kidney complication: Secondary | ICD-10-CM | POA: Diagnosis not present

## 2013-07-13 DIAGNOSIS — I10 Essential (primary) hypertension: Secondary | ICD-10-CM | POA: Diagnosis not present

## 2013-07-13 DIAGNOSIS — E119 Type 2 diabetes mellitus without complications: Secondary | ICD-10-CM | POA: Diagnosis not present

## 2013-07-18 ENCOUNTER — Non-Acute Institutional Stay (SKILLED_NURSING_FACILITY): Payer: Medicare Other | Admitting: Internal Medicine

## 2013-07-18 DIAGNOSIS — I951 Orthostatic hypotension: Secondary | ICD-10-CM | POA: Diagnosis not present

## 2013-07-18 DIAGNOSIS — F329 Major depressive disorder, single episode, unspecified: Secondary | ICD-10-CM

## 2013-07-18 DIAGNOSIS — G8110 Spastic hemiplegia affecting unspecified side: Secondary | ICD-10-CM

## 2013-07-18 DIAGNOSIS — I959 Hypotension, unspecified: Secondary | ICD-10-CM | POA: Diagnosis not present

## 2013-07-18 DIAGNOSIS — K5900 Constipation, unspecified: Secondary | ICD-10-CM | POA: Diagnosis not present

## 2013-07-18 DIAGNOSIS — D509 Iron deficiency anemia, unspecified: Secondary | ICD-10-CM

## 2013-07-18 DIAGNOSIS — E785 Hyperlipidemia, unspecified: Secondary | ICD-10-CM

## 2013-07-18 DIAGNOSIS — F32A Depression, unspecified: Secondary | ICD-10-CM

## 2013-07-18 DIAGNOSIS — F3289 Other specified depressive episodes: Secondary | ICD-10-CM

## 2013-07-18 NOTE — Progress Notes (Signed)
MRN: [id number] Name: Chelsey Gallagher  Sex: female Age: 63 y.o. DOB: 1950-03-17  [place] #: [name] Facility/Room: 126A Level Of Care: SNF Provider: Hennie Duos Emergency Contacts: Extended Emergency Contact Information Primary Emergency Contact: Devoria Albe [place] Phone: [phone] Mobile Phone: [phone] Relation: Daughter  Code Status: DNR  Allergies: Codeine; Flexeril; and Lyrica  Chief Complaint  Patient presents with  . Medical Management of Chronic Issues    HPI: Patient is 63 y.o. female who is being seen for routine issues.  Past Medical History  Diagnosis Date  . Diabetes mellitus   . Hypertension   . PAD (peripheral artery disease)     S/p bypass grafting, unclear exactly where  . Nephrolithiasis   . Hyperlipidemia 05/14/2011  . Hearing loss   . Complication of anesthesia     doesn't wake up good- "usually end up in ICU" also experiences post op nausea and vomiting  . Orthostatic hypotension     after surgery.  Marland Kitchen Heart murmur     PCP- [facility]- No tx needed.  . Stroke 05/2011    Weakness on Right. Wears leg brace.  . Foot drop, left   . H/O hiatal hernia     second one  . Diabetic neuropathy     Diabetic neuropathy- has inproved since she quit smoking.  . Arthritis   . Depression     Past Surgical History  Procedure Laterality Date  . Total hip arthroplasty  2008    Right  . Abdominal hysterectomy  2003  . Cesarean section  1984  . Cholecystectomy  1984  . Bypass graft  2003    Abdominal aortic  . Kidney stone surgery  1999  . Appendectomy  2011  . Hernia repair  2011  . Lithotripsy    . Abdominal hysterectomy    . Esophagogastroduodenoscopy N/A 05/09/2013    Procedure: ESOPHAGOGASTRODUODENOSCOPY (EGD);  Surgeon: Lafayette Dragon, MD;  Location: [facility] ENDOSCOPY;  Service: Endoscopy;  Laterality: N/A;  . Colonoscopy N/A 05/10/2013    Procedure: COLONOSCOPY;  Surgeon: Lafayette Dragon, MD;  Location: [facility]  ENDOSCOPY;  Service: Endoscopy;  Laterality: N/A;      Medication List       This list is accurate as of: 07/18/13 11:59 PM.  Always use your most recent med list.               acetaminophen 325 MG tablet  Commonly known as:  TYLENOL  Take 650 mg by mouth every 6 (six) hours as needed for pain.     aspirin EC 81 MG tablet  Take 81 mg by mouth daily.     baclofen 20 MG tablet  Commonly known as:  LIORESAL  Take 20 mg by mouth at bedtime.     baclofen 10 MG tablet  Commonly known as:  LIORESAL  Take 5 mg by mouth 2 (two) times daily. 5 mg at 8a and 4pm     BENEFIBER Powd  Take 1 Applicatorful by mouth daily. Mix with 8oz of fluid     BIOFREEZE 4 % Gel  Generic drug:  Menthol (Topical Analgesic)  Apply 1 application topically every 6 (six) hours as needed (pain). To right ankle     cholecalciferol 1000 UNITS tablet  Commonly known as:  VITAMIN D  Take 1,000 Units by mouth daily.     clopidogrel 75 MG tablet  Commonly known as:  PLAVIX  Take 75 mg by mouth daily.  DULoxetine 30 MG capsule  Commonly known as:  CYMBALTA  Take 60 mg by mouth daily.     ferrous sulfate 325 (65 FE) MG tablet  Take 325 mg by mouth 3 (three) times daily.     gabapentin 300 MG capsule  Commonly known as:  NEURONTIN  Take 300 mg by mouth at bedtime.     insulin glargine 100 UNIT/ML injection  Commonly known as:  LANTUS  Inject 12 Units into the skin at bedtime.     LINZESS 145 MCG Caps capsule  Generic drug:  Linaclotide  Take 145 mcg by mouth daily.     lubiprostone 24 MCG capsule  Commonly known as:  AMITIZA  Take 24 mcg by mouth 2 (two) times daily with a meal.     magnesium hydroxide 800 MG/5ML suspension  Commonly known as:  MILK OF MAGNESIA  Take 30 mLs by mouth daily as needed for constipation. 30cc     menthol-cetylpyridinium 3 MG lozenge  Commonly known as:  CEPACOL  Take 1 lozenge by mouth every 4 (four) hours as needed for sore throat.     metFORMIN 500 MG  tablet  Commonly known as:  GLUCOPHAGE  Take 1,000 mg by mouth 2 (two) times daily with a meal.     methocarbamol 500 MG tablet  Commonly known as:  ROBAXIN  Take 500 mg by mouth 3 (three) times daily.     midodrine 5 MG tablet  Commonly known as:  PROAMATINE  Take 5 mg by mouth 3 (three) times daily.     polyethylene glycol packet  Commonly known as:  MIRALAX / GLYCOLAX  Take 17 g by mouth daily as needed for mild constipation.     pravastatin 40 MG tablet  Commonly known as:  PRAVACHOL  Take 40 mg by mouth daily.     sennosides-docusate sodium 8.6-50 MG tablet  Commonly known as:  SENOKOT-S  Take 1 tablet by mouth 2 (two) times daily.     traMADol 100 MG 24 hr tablet  Commonly known as:  ULTRAM-ER  Take 100 mg by mouth every 8 (eight) hours as needed for pain.        No orders of the defined types were placed in this encounter.    Immunization History  Administered Date(s) Administered  . Influenza-Unspecified 12/04/2011  . Pneumococcal Polysaccharide-23 09/03/2011  . Pneumococcal-Unspecified 12/02/2010    History  Substance Use Topics  . Smoking status: Former Smoker -- 0.00 packs/day for 45 years    Types: Cigarettes  . Smokeless tobacco: Former Systems developer    Quit date: 05/08/2011  . Alcohol Use: No    Review of Systems  DATA OBTAINED: from patient; we vsited for a while and spoke of many things GENERAL: Feels well no fevers, fatigue, appetite changes SKIN: No itching, rash HEENT: No complaint RESPIRATORY: No cough, wheezing, SOB CARDIAC: No chest pain, palpitations, lower extremity edema  GI: No abdominal pain, No N/V/D or constipation, No heartburn or reflux  GU: No dysuria, frequency or urgency, or incontinence  MUSCULOSKELETAL: No unrelieved bone/joint pain NEUROLOGIC: No headache, dizziness PSYCHIATRIC: No overt anxiety or sadness. Sleeps well.   Filed Vitals:   07/18/13 2146  BP: 147/78  Pulse: 87  Temp: 98 F (36.7 C)  Resp: 18    Physical  Exam  GENERAL APPEARANCE: Alert, conversant. Appropriately groomed. No acute distress  SKIN: No diaphoresis rash HEENT: Unremarkable RESPIRATORY: Breathing is even, unlabored. Lung sounds are clear   CARDIOVASCULAR: Heart RRR  no murmurs, rubs or gallops. No peripheral edema  GASTROINTESTINAL: Abdomen is soft, non-tender, not distended w/ normal bowel sounds.  GENITOURINARY: Bladder non tender, not distended  MUSCULOSKELETAL: contracture at wrist with brace NEUROLOGIC: Cranial nerves 2-12 grossly intact; old paresis PSYCHIATRIC: Mood and affect appropriate to situation, no behavioral issues  Patient Active Problem List   Diagnosis Date Noted  . Late effects of CVA (cerebrovascular accident) 07/08/2013  . Depression   . Hypotension 05/08/2013  . Fecal occult blood test positive 05/08/2013  . Altered mental status 05/08/2013  . Insomnia 04/26/2013  . UTI (urinary tract infection) 07/02/2012  . Anemia, iron deficiency 07/02/2012  . Ingrown toenail 07/02/2012  . Unspecified constipation 07/02/2012  . 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 Unspecified cerebral artery occlusion with cerebral infarction 04/13/2012  . Orthostatic hypotension 04/06/2012  . Abnormal TSH 04/06/2012  . Spastic hemiplegia affecting dominant side 07/25/2011  . Stroke 05/15/2011  . Hyperlipidemia 05/14/2011  . Tobacco abuse 05/14/2011  . 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 Lung abnormality 05/11/2011  . CVA (cerebral infarction) 05/08/2011  . Hypertension   . Type II or unspecified type diabetes mellitus with neurological manifestations, uncontrolled(250.62)     CBC    Component Value Date/Time   WBC 6.0 05/09/2013 0815   RBC 3.68* 05/09/2013 0815   RBC 2.99* 04/05/2012 0345   HGB 9.6* 05/09/2013 0815   HCT 31.7* 05/09/2013 0815   PLT 247 05/09/2013 0815   MCV 86.1 05/09/2013 0815   LYMPHSABS 2.0 05/07/2013 2223   MONOABS 0.6 05/07/2013 2223   EOSABS 0.1 05/07/2013  2223   BASOSABS 0.0 05/07/2013 2223    CMP     Component Value Date/Time   NA 140 05/09/2013 0815   K 4.3 05/09/2013 0815   CL 104 05/09/2013 0815   CO2 23 05/09/2013 0815   GLUCOSE 136* 05/09/2013 0815   BUN 11 05/09/2013 0815   CREATININE 0.63 05/09/2013 0815   CALCIUM 10.0 05/09/2013 0815   PROT 6.6 05/07/2013 2223   ALBUMIN 3.3* 05/07/2013 2223   AST 32 05/07/2013 2223   ALT 20 05/07/2013 2223   ALKPHOS 58 05/07/2013 2223   BILITOT 0.2* 05/07/2013 2223   GFRNONAA >90 05/09/2013 0815   GFRAA >90 05/09/2013 0815    Assessment and Plan  Orthostatic hypotension Pt's BP is a little high today, but orthostasis has been controlled with midodrine  Hypotension Currently not a problem  Unspecified constipation Pt has no c/o with increase in colace  Spastic hemiplegia affecting dominant side Pt has no c/o. She gets bored in her chair doing the same thing every day  Hyperlipidemia Continue with pravachol  Anemia, iron deficiency Continue with iron  Depression Pt seems to be dealing with her situation very well today    Hennie Duos, MD

## 2013-07-22 ENCOUNTER — Encounter: Payer: Self-pay | Admitting: Internal Medicine

## 2013-07-22 NOTE — Assessment & Plan Note (Signed)
Continue with pravachol

## 2013-07-22 NOTE — Assessment & Plan Note (Signed)
Pt seems to be dealing with her situation very well today

## 2013-07-22 NOTE — Assessment & Plan Note (Signed)
Pt's BP is a little high today, but orthostasis has been controlled with midodrine

## 2013-07-22 NOTE — Assessment & Plan Note (Signed)
Continue with iron

## 2013-07-22 NOTE — Assessment & Plan Note (Signed)
Pt has no c/o. She gets bored in her chair doing the same thing every day

## 2013-07-22 NOTE — Assessment & Plan Note (Signed)
Currently not a problem

## 2013-07-22 NOTE — Assessment & Plan Note (Signed)
Pt has no c/o with increase in colace

## 2013-08-08 DIAGNOSIS — E1149 Type 2 diabetes mellitus with other diabetic neurological complication: Secondary | ICD-10-CM | POA: Diagnosis not present

## 2013-08-08 DIAGNOSIS — Z7982 Long term (current) use of aspirin: Secondary | ICD-10-CM | POA: Diagnosis not present

## 2013-08-08 DIAGNOSIS — I10 Essential (primary) hypertension: Secondary | ICD-10-CM | POA: Diagnosis not present

## 2013-08-11 ENCOUNTER — Other Ambulatory Visit: Payer: Self-pay | Admitting: *Deleted

## 2013-08-11 MED ORDER — TRAMADOL HCL ER 100 MG PO TB24: ORAL_TABLET | ORAL | Status: DC

## 2013-08-11 NOTE — Progress Notes (Signed)
Script sent to [facility] for tramadol.

## 2013-08-12 ENCOUNTER — Other Ambulatory Visit: Payer: Self-pay

## 2013-08-12 MED ORDER — TRAMADOL HCL 50 MG PO TABS: 100.0000 mg | ORAL_TABLET | Freq: Three times a day (TID) | ORAL | Status: DC | PRN

## 2013-08-12 NOTE — Telephone Encounter (Signed)
Message was received from the [facility]. RX was suppose to be for plain Tramadol 50 mg   RX sent to [facility] @ [phone]. Phone number [phone]

## 2013-08-17 DIAGNOSIS — F3289 Other specified depressive episodes: Secondary | ICD-10-CM | POA: Diagnosis not present

## 2013-08-17 DIAGNOSIS — F329 Major depressive disorder, single episode, unspecified: Secondary | ICD-10-CM | POA: Diagnosis not present

## 2013-08-18 DIAGNOSIS — E039 Hypothyroidism, unspecified: Secondary | ICD-10-CM | POA: Diagnosis not present

## 2013-08-18 DIAGNOSIS — D649 Anemia, unspecified: Secondary | ICD-10-CM | POA: Diagnosis not present

## 2013-08-19 ENCOUNTER — Other Ambulatory Visit: Payer: Self-pay | Admitting: Radiology

## 2013-08-19 DIAGNOSIS — I6523 Occlusion and stenosis of bilateral carotid arteries: Secondary | ICD-10-CM

## 2013-08-26 ENCOUNTER — Non-Acute Institutional Stay (SKILLED_NURSING_FACILITY): Payer: Medicare Other | Admitting: Nurse Practitioner

## 2013-08-26 DIAGNOSIS — F411 Generalized anxiety disorder: Secondary | ICD-10-CM

## 2013-08-26 DIAGNOSIS — M1710 Unilateral primary osteoarthritis, unspecified knee: Secondary | ICD-10-CM

## 2013-08-26 DIAGNOSIS — G8110 Spastic hemiplegia affecting unspecified side: Secondary | ICD-10-CM | POA: Diagnosis not present

## 2013-08-26 DIAGNOSIS — E1149 Type 2 diabetes mellitus with other diabetic neurological complication: Secondary | ICD-10-CM

## 2013-08-26 DIAGNOSIS — K5900 Constipation, unspecified: Secondary | ICD-10-CM | POA: Diagnosis not present

## 2013-08-26 DIAGNOSIS — N318 Other neuromuscular dysfunction of bladder: Secondary | ICD-10-CM | POA: Diagnosis not present

## 2013-08-26 DIAGNOSIS — D509 Iron deficiency anemia, unspecified: Secondary | ICD-10-CM

## 2013-08-26 DIAGNOSIS — M170 Bilateral primary osteoarthritis of knee: Secondary | ICD-10-CM

## 2013-08-26 DIAGNOSIS — N3281 Overactive bladder: Secondary | ICD-10-CM

## 2013-08-26 NOTE — Progress Notes (Signed)
Patient ID: Chelsey Gallagher, female   DOB: September 19, 1950, 63 y.o.   MRN: [id number]    Nursing Home Location:  Ward of Service: SNF (31)  PCP: Hennie Duos, MD  Allergies  Allergen Reactions  . Codeine Other (See Comments)    Per Mar  . Flexeril [Cyclobenzaprine Hcl] Other (See Comments)    Per Mar  . Lyrica [Pregabalin] Other (See Comments)    "wires my up"    Chief Complaint  Patient presents with  . Medical Management of Chronic Issues    HPI:  63 year old female with a pmh of CVA with spastic hemiplegia, constipation, anxiety, DM, HTN, and hyperlipidemia is being seen today for routine follow up on chronic conditions  Pt with on-going complaints regarading muscle spasms, reports this is some better on robaxin but mostly notice that it helps at night, taking bacolfen as well. Does not take medications routinely for constipation and this has helped with regular BM. Was started on vesicare last month due to increase in urination, increased freq and some burning, this has not helped much per pt. Pt would also like biofreeze to her knees to help with arthritic pain.   Review of Systems:  Review of Systems  Constitutional: Negative for weight loss and malaise/fatigue.  HENT: Negative for congestion and sore throat.   Eyes: Negative for blurred vision.  Respiratory: Negative for cough, sputum production and shortness of breath.   Cardiovascular: Negative for chest pain, palpitations and leg swelling.  Gastrointestinal: Negative for heartburn, abdominal pain, diarrhea and constipation (improved on current medications ).  Genitourinary: Positive for dysuria, urgency and frequency.  Musculoskeletal: Positive for myalgias (legs and right arm and hand).  Skin: Negative.   Neurological: Positive for dizziness (occasionally). Negative for tingling, sensory change, speech change, weakness and headaches.  Psychiatric/Behavioral: Negative for depression. The  patient does not have insomnia.      Past Medical History  Diagnosis Date  . Diabetes mellitus   . Hypertension   . PAD (peripheral artery disease)     S/p bypass grafting, unclear exactly where  . Nephrolithiasis   . Hyperlipidemia 05/14/2011  . Hearing loss   . Complication of anesthesia     doesn't wake up good- "usually end up in ICU" also experiences post op nausea and vomiting  . Orthostatic hypotension     after surgery.  Marland Kitchen Heart murmur     PCP- [facility]- No tx needed.  . Stroke 05/2011    Weakness on Right. Wears leg brace.  . Foot drop, left   . H/O hiatal hernia     second one  . Diabetic neuropathy     Diabetic neuropathy- has inproved since she quit smoking.  . Arthritis   . Depression    Past Surgical History  Procedure Laterality Date  . Total hip arthroplasty  2008    Right  . Abdominal hysterectomy  2003  . Cesarean section  1984  . Cholecystectomy  1984  . Bypass graft  2003    Abdominal aortic  . Kidney stone surgery  1999  . Appendectomy  2011  . Hernia repair  2011  . Lithotripsy    . Abdominal hysterectomy    . Esophagogastroduodenoscopy N/A 05/09/2013    Procedure: ESOPHAGOGASTRODUODENOSCOPY (EGD);  Surgeon: Lafayette Dragon, MD;  Location: [facility] ENDOSCOPY;  Service: Endoscopy;  Laterality: N/A;  . Colonoscopy N/A 05/10/2013    Procedure: COLONOSCOPY;  Surgeon: Lowella Bandy  Olevia Perches, MD;  Location: [facility] ENDOSCOPY;  Service: Endoscopy;  Laterality: N/A;   Social History:   reports that she has quit smoking. Her smoking use included Cigarettes. She smoked 0.00 packs per day for 45 years. She quit smokeless tobacco use about 2 years ago. She reports that she does not drink alcohol or use illicit drugs.  Family History  Problem Relation Age of Onset  . Cancer Maternal Aunt     breast    Medications: Patient's Medications  New Prescriptions   No medications on file  Previous Medications   ACETAMINOPHEN (TYLENOL) 325 MG TABLET    Take 650 mg by  mouth every 6 (six) hours as needed for pain.   ASPIRIN EC 81 MG TABLET    Take 81 mg by mouth daily.   BACLOFEN (LIORESAL) 10 MG TABLET    Take 5 mg by mouth 2 (two) times daily. 5 mg at 8a and 4pm   BACLOFEN (LIORESAL) 20 MG TABLET    Take 20 mg by mouth at bedtime.   CHOLECALCIFEROL (VITAMIN D) 1000 UNITS TABLET    Take 1,000 Units by mouth daily.   CLOPIDOGREL (PLAVIX) 75 MG TABLET    Take 75 mg by mouth daily.   DULOXETINE (CYMBALTA) 30 MG CAPSULE    Take 60 mg by mouth daily.   FERROUS SULFATE 325 (65 FE) MG TABLET    Take 325 mg by mouth 3 (three) times daily.    GABAPENTIN (NEURONTIN) 300 MG CAPSULE    Take 300 mg by mouth at bedtime.   INSULIN GLARGINE (LANTUS) 100 UNIT/ML INJECTION    Inject 12 Units into the skin at bedtime.    LINACLOTIDE (LINZESS) 145 MCG CAPS CAPSULE    Take 145 mcg by mouth daily.   LUBIPROSTONE (AMITIZA) 24 MCG CAPSULE    Take 24 mcg by mouth 2 (two) times daily with a meal.   MAGNESIUM HYDROXIDE (MILK OF MAGNESIA) 800 MG/5ML SUSPENSION    Take 30 mLs by mouth daily as needed for constipation. 30cc   MENTHOL, TOPICAL ANALGESIC, (BIOFREEZE) 4 % GEL    Apply 1 application topically every 6 (six) hours as needed (pain). To right ankle   MENTHOL-CETYLPYRIDINIUM (CEPACOL) 3 MG LOZENGE    Take 1 lozenge by mouth every 4 (four) hours as needed for sore throat.   METFORMIN (GLUCOPHAGE) 500 MG TABLET    Take 1,000 mg by mouth 2 (two) times daily with a meal.    METHOCARBAMOL (ROBAXIN) 500 MG TABLET    Take 500 mg by mouth 3 (three) times daily.   MIDODRINE (PROAMATINE) 5 MG TABLET    Take 5 mg by mouth 3 (three) times daily.   POLYETHYLENE GLYCOL (MIRALAX / GLYCOLAX) PACKET    Take 17 g by mouth daily as needed for mild constipation.    PRAVASTATIN (PRAVACHOL) 40 MG TABLET    Take 40 mg by mouth daily.    SENNOSIDES-DOCUSATE SODIUM (SENOKOT-S) 8.6-50 MG TABLET    Take 1 tablet by mouth 2 (two) times daily.   SOLIFENACIN (VESICARE) 5 MG TABLET    Take 5 mg by mouth daily.     TRAMADOL (ULTRAM) 50 MG TABLET    Take 2 tablets (100 mg total) by mouth every 8 (eight) hours as needed.   TRAMADOL (ULTRAM-ER) 100 MG 24 HR TABLET    Take 2 tablets by mouth every 8 hours as needed.   WHEAT DEXTRIN (BENEFIBER) POWD    Take 1 Applicatorful by mouth daily. Mix  with 8oz of fluid  Modified Medications   No medications on file  Discontinued Medications   No medications on file     Physical Exam: Physical Exam  Vitals reviewed. Constitutional: She is oriented to person, place, and time and well-developed, well-nourished, and in no distress. No distress.  HENT:  Nose: Nose normal.  Mouth/Throat: Oropharynx is clear and moist.  Eyes: Conjunctivae and EOM are normal. Pupils are equal, round, and reactive to light.  Neck: Normal range of motion. Neck supple.  Cardiovascular: Normal rate and regular rhythm.   Murmur heard. Pulmonary/Chest: Effort normal and breath sounds normal. No respiratory distress. She has no wheezes.  Abdominal: Soft. Bowel sounds are normal. She exhibits no distension.  Musculoskeletal: She exhibits no edema and no tenderness.  Neurological: She is alert and oriented to person, place, and time. She exhibits abnormal muscle tone. Coordination abnormal.  Right sided hemiparesis with contracture to right wrist- in brace  Skin: Skin is warm and dry. She is not diaphoretic.  Psychiatric: Affect normal.    Filed Vitals:   08/26/13 1804  BP: 131/67  Pulse: 74  Temp: 97.7 F (36.5 C)  Resp: 20      Labs reviewed: Basic Metabolic Panel:  Recent Labs  05/07/13 2223 05/09/13 0815  NA 132* 140  K 4.6 4.3  CL 94* 104  CO2 23 23  GLUCOSE 167* 136*  BUN 20 11  CREATININE 0.82 0.63  CALCIUM 9.8 10.0   Liver Function Tests:  Recent Labs  05/07/13 2223  AST 32  ALT 20  ALKPHOS 58  BILITOT 0.2*  PROT 6.6  ALBUMIN 3.3*   No results found for this basename: LIPASE, AMYLASE,  in the last 8760 hours No results found for this basename:  AMMONIA,  in the last 8760 hours CBC:  Recent Labs  05/07/13 2223 05/09/13 0815  WBC 8.1 6.0  NEUTROABS 5.4  --   HGB 8.6* 9.6*  HCT 27.9* 31.7*  MCV 86.6 86.1  PLT 253 247   Cardiac Enzymes:  Recent Labs  05/07/13 2227  TROPONINI <0.30   BNP: No components found with this basename: POCBNP,  CBG:  Recent Labs  05/10/13 0810 05/10/13 1206 05/10/13 1611  GLUCAP 130* 121* 215*   TSH: No results found for this basename: TSH,  in the last 8760 hours A1C: Lab Results  Component Value Date   HGBA1C 5.3 04/05/2012  CBC NO Diff (Complete Blood Count)  Result: 04/11/2013 4:52 PM ( Status: F ) C  WBC 6.6 4.0-10.5 K/uL SLN  RBC 2.44 L 4.22-5.81 MIL/uL SLN  Hemoglobin 5.6 LL 13.0-17.0 g/dL SLN C  Hematocrit 18.7 L 39.0-52.0 % SLN  MCV 76.3 L 78.0-100.0 fL SLN  MCH 23.0 L 26.0-34.0 pg SLN  MCHC 30.5 30.0-36.0 g/dL SLN  RDW 17.5 H 11.5-15.5 % SLN  Platelet Count 243 150-400 K/uL SLN  Basic Metabolic Panel  Result: 05/03/4399 8:54 PM ( Status: F )  Sodium 133 L 135-145 mEq/L SLN  Potassium 5.7 H 3.5-5.3 mEq/L SLN C  Chloride 101 96-112 mEq/L SLN  CO2 26 19-32 mEq/L SLN  Glucose 93 70-99 mg/dL SLN  BUN 23 6-23 mg/dL SLN  Creatinine 0.78 0.50-1.35 mg/dL SLN  Calcium 9.3 8.4-10.5 mg/dL SLN  Lipid Profile  Result: 04/11/2013 8:54 PM ( Status: F )  Cholesterol 118 0-200 mg/dL SLN C  Triglyceride 119 <150 mg/dL SLN  HDL Cholesterol 34 L >39 mg/dL SLN  Total Chol/HDL Ratio 3.5 Ratio SLN  VLDL Cholesterol (Calc)  24 0-40 mg/dL SLN  LDL Cholesterol (Calc) 60 0-99 mg/dL SLN C  Hemoglobin A1C  Result: 04/12/2013 2:17 PM ( Status: F )  Hemoglobin A1C 5.5 <5.7 % SLN C  Estimated Average Glucose 111  CBC with Diff  Result: 06/06/2013 3:02 PM ( Status: F ) C  WBC 6.1 4.0-10.5 K/uL SLN  RBC 3.83 L 3.87-5.11 MIL/uL SLN  Hemoglobin 10.4 L 12.0-15.0 g/dL SLN  Hematocrit 33.1 L 36.0-46.0 % SLN  MCV 86.4 78.0-100.0 fL SLN  MCH 27.2 26.0-34.0 pg SLN  MCHC 31.4 30.0-36.0 g/dL SLN  RDW  19.5 H 11.5-15.5 % SLN  Platelet Count 240 150-400 K/uL SLN  Granulocyte % 62 43-77 % SLN  Absolute Gran 3.8 1.7-7.7 K/uL SLN  Lymph % 27 12-46 % SLN  Absolute Lymph 1.6 0.7-4.0 K/uL SLN  Mono % 9 3-12 % SLN  Absolute Mono 0.5 0.1-1.0 K/uL SLN  Eos % 2 0-5 % SLN  Absolute Eos 0.1 0.0-0.7 K/uL SLN  Baso % 0 0-1 % SLN  Absolute Baso 0.0 0.0-0.1 K/uL SLN  Smear Review Criteria for review not met       Assessment/Plan 1. Unspecified constipation -improved on amitiza  2. Type II or unspecified type diabetes mellitus with neurological manifestations, uncontrolled(250.62) -well controlled on current medications  3. Spastic hemiplegia affecting dominant side -has not seen much benefit from robaxin except at night, will decrease to qhs only  4. Anemia, iron deficiency conts iron qhs, will follow up cbc next month  5. Generalized anxiety disorder Following with psych now, added Klonopin 0.5 PRN, pt has not used was not aware she had to ask for it but reports anxiety attacks and now will ask if needs mediation   6. Overactive bladder -has not seen improvement on vesicare, will start myrbetriq 25 mg q day and stop vesicare at this time  7. 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

## 2013-08-27 MED ORDER — MENTHOL (TOPICAL ANALGESIC) 4 % EX GEL: CUTANEOUS | Status: DC

## 2013-08-27 MED ORDER — METHOCARBAMOL 500 MG PO TABS: 500.0000 mg | ORAL_TABLET | Freq: Every day | ORAL | Status: DC

## 2013-08-27 MED ORDER — MIRABEGRON ER 25 MG PO TB24: 25.0000 mg | ORAL_TABLET | Freq: Every day | ORAL | Status: DC

## 2013-09-12 DIAGNOSIS — F329 Major depressive disorder, single episode, unspecified: Secondary | ICD-10-CM | POA: Diagnosis not present

## 2013-09-12 DIAGNOSIS — F411 Generalized anxiety disorder: Secondary | ICD-10-CM | POA: Diagnosis not present

## 2013-09-12 DIAGNOSIS — F3289 Other specified depressive episodes: Secondary | ICD-10-CM | POA: Diagnosis not present

## 2013-09-16 ENCOUNTER — Telehealth (HOSPITAL_COMMUNITY): Payer: Self-pay | Admitting: Interventional Radiology

## 2013-09-16 NOTE — Telephone Encounter (Signed)
Called pt, left VM for her to call to schedule US carotids for f/u JM

## 2013-09-20 ENCOUNTER — Telehealth (HOSPITAL_COMMUNITY): Payer: Self-pay | Admitting: Interventional Radiology

## 2013-09-20 NOTE — Telephone Encounter (Signed)
Called pt, left VM for her to call to schedule US carotids. JM 

## 2013-09-22 ENCOUNTER — Ambulatory Visit (HOSPITAL_COMMUNITY)
Admission: RE | Admit: 2013-09-22 | Discharge: 2013-09-22 | Disposition: A | Discharge status: Home / Self Care | Payer: Medicare Other | Source: Ambulatory Visit | Attending: Interventional Radiology | Admitting: Interventional Radiology

## 2013-09-22 DIAGNOSIS — N390 Urinary tract infection, site not specified: Secondary | ICD-10-CM | POA: Diagnosis not present

## 2013-09-22 DIAGNOSIS — I63239 Cerebral infarction due to unspecified occlusion or stenosis of unspecified carotid arteries: Secondary | ICD-10-CM | POA: Diagnosis not present

## 2013-09-22 DIAGNOSIS — I6523 Occlusion and stenosis of bilateral carotid arteries: Secondary | ICD-10-CM

## 2013-09-22 DIAGNOSIS — I6529 Occlusion and stenosis of unspecified carotid artery: Secondary | ICD-10-CM | POA: Diagnosis not present

## 2013-09-22 NOTE — Progress Notes (Addendum)
VASCULAR LAB PRELIMINARY  PRELIMINARY  PRELIMINARY  PRELIMINARY  Carotid duplex completed.    Preliminary report:  Right - Patent right stent. 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  Dynasty Holquin, RVS 09/22/2013, 4:29 PM

## 2013-09-24 ENCOUNTER — Non-Acute Institutional Stay (SKILLED_NURSING_FACILITY): Payer: Medicare Other | Admitting: Internal Medicine

## 2013-09-24 ENCOUNTER — Encounter: Payer: Self-pay | Admitting: Internal Medicine

## 2013-09-24 DIAGNOSIS — R197 Diarrhea, unspecified: Secondary | ICD-10-CM

## 2013-09-24 DIAGNOSIS — I959 Hypotension, unspecified: Secondary | ICD-10-CM

## 2013-09-24 DIAGNOSIS — N3000 Acute cystitis without hematuria: Secondary | ICD-10-CM

## 2013-09-24 DIAGNOSIS — I6990 Unspecified sequelae of unspecified cerebrovascular disease: Secondary | ICD-10-CM | POA: Diagnosis not present

## 2013-09-24 NOTE — Progress Notes (Signed)
MRN: [id number] Name: Chelsey Gallagher  Sex: female Age: 63 y.o. DOB: June 27, 1950  [place] #: [name] Facility/Room: 126A Level Of Care: SNF Provider: Inocencio Homes D Emergency Contacts: Extended Emergency Contact Information Primary Emergency Contact: [name] of [place] Phone: [phone] Mobile Phone: [phone] Relation: Daughter  Code Status: DNR  Allergies: Codeine; Flexeril; and Lyrica  Chief Complaint  Patient presents with  . Medical Management of Chronic Issues    HPI: Patient is 63 y.o. female who has concerns of dysuria, and diarrhea.  Past Medical History  Diagnosis Date  . Diabetes mellitus   . Hypertension   . PAD (peripheral artery disease)     S/p bypass grafting, unclear exactly where  . Nephrolithiasis   . Hyperlipidemia 05/14/2011  . Hearing loss   . Complication of anesthesia     doesn't wake up good- "usually end up in ICU" also experiences post op nausea and vomiting  . Orthostatic hypotension     after surgery.  Marland Kitchen Heart murmur     PCP- [facility]- No tx needed.  . Stroke 05/2011    Weakness on Right. Wears leg brace.  . Foot drop, left   . H/O hiatal hernia     second one  . Diabetic neuropathy     Diabetic neuropathy- has inproved since she quit smoking.  . Arthritis   . Depression     Past Surgical History  Procedure Laterality Date  . Total hip arthroplasty  2008    Right  . Abdominal hysterectomy  2003  . Cesarean section  1984  . Cholecystectomy  1984  . Bypass graft  2003    Abdominal aortic  . Kidney stone surgery  1999  . Appendectomy  2011  . Hernia repair  2011  . Lithotripsy    . Abdominal hysterectomy    . Esophagogastroduodenoscopy N/A 05/09/2013    Procedure: ESOPHAGOGASTRODUODENOSCOPY (EGD);  Surgeon: Lafayette Dragon, MD;  Location: [facility] ENDOSCOPY;  Service: Endoscopy;  Laterality: N/A;  . Colonoscopy N/A 05/10/2013    Procedure: COLONOSCOPY;  Surgeon: Lafayette Dragon, MD;  Location:  [facility] ENDOSCOPY;  Service: Endoscopy;  Laterality: N/A;      Medication List       This list is accurate as of: 09/24/13 11:59 PM.  Always use your most recent med list.               acetaminophen 325 MG tablet  Commonly known as:  TYLENOL  Take 650 mg by mouth every 6 (six) hours as needed for pain.     aspirin EC 81 MG tablet  Take 81 mg by mouth daily.     baclofen 20 MG tablet  Commonly known as:  LIORESAL  Take 20 mg by mouth at bedtime.     baclofen 10 MG tablet  Commonly known as:  LIORESAL  Take 5 mg by mouth 2 (two) times daily. 5 mg at 8a and 4pm     BENEFIBER Powd  Take 1 Applicatorful by mouth daily. Mix with 8oz of fluid     cholecalciferol 1000 UNITS tablet  Commonly known as:  VITAMIN D  Take 1,000 Units by mouth daily.     clopidogrel 75 MG tablet  Commonly known as:  PLAVIX  Take 75 mg by mouth daily.     DULoxetine 30 MG capsule  Commonly known as:  CYMBALTA  Take 60 mg by mouth daily.     ferrous sulfate 325 (65 FE) MG  tablet  Take 325 mg by mouth 3 (three) times daily.     gabapentin 300 MG capsule  Commonly known as:  NEURONTIN  Take 300 mg by mouth at bedtime.     insulin glargine 100 UNIT/ML injection  Commonly known as:  LANTUS  Inject 12 Units into the skin at bedtime.     lubiprostone 24 MCG capsule  Commonly known as:  AMITIZA  Take 24 mcg by mouth 2 (two) times daily with a meal.     magnesium hydroxide 800 MG/5ML suspension  Commonly known as:  MILK OF MAGNESIA  Take 30 mLs by mouth daily as needed for constipation. 30cc     Menthol (Topical Analgesic) 4 % Gel  Commonly known as:  BIOFREEZE  To right ankle and bilateral knees TID     menthol-cetylpyridinium 3 MG lozenge  Commonly known as:  CEPACOL  Take 1 lozenge by mouth every 4 (four) hours as needed for sore throat.     metFORMIN 500 MG tablet  Commonly known as:  GLUCOPHAGE  Take 1,000 mg by mouth 2 (two) times daily with a meal.     methocarbamol 500 MG tablet   Commonly known as:  ROBAXIN  Take 1 tablet (500 mg total) by mouth at bedtime.     midodrine 5 MG tablet  Commonly known as:  PROAMATINE  Take 5 mg by mouth 3 (three) times daily.     mirabegron ER 25 MG Tb24 tablet  Commonly known as:  MYRBETRIQ  Take 1 tablet (25 mg total) by mouth daily.     polyethylene glycol packet  Commonly known as:  MIRALAX / GLYCOLAX  Take 17 g by mouth daily as needed for mild constipation.     pravastatin 40 MG tablet  Commonly known as:  PRAVACHOL  Take 40 mg by mouth daily.     sennosides-docusate sodium 8.6-50 MG tablet  Commonly known as:  SENOKOT-S  Take 1 tablet by mouth 2 (two) times daily.     traMADol 100 MG 24 hr tablet  Commonly known as:  ULTRAM-ER  Take 2 tablets by mouth every 8 hours as needed.     traMADol 50 MG tablet  Commonly known as:  ULTRAM  Take 2 tablets (100 mg total) by mouth every 8 (eight) hours as needed.        No orders of the defined types were placed in this encounter.    Immunization History  Administered Date(s) Administered  . Influenza-Unspecified 12/04/2011  . Pneumococcal Polysaccharide-23 09/03/2011  . Pneumococcal-Unspecified 12/02/2010    History  Substance Use Topics  . Smoking status: Former Smoker -- 0.00 packs/day for 45 years    Types: Cigarettes  . Smokeless tobacco: Former Systems developer    Quit date: 05/08/2011  . Alcohol Use: No    Review of Systems  DATA OBTAINED: from patient GENERAL: Feels well no fevers, fatigue, appetite changes SKIN: No itching, rash HEENT: No complaint RESPIRATORY: No cough, wheezing, SOB CARDIAC: No chest pain, palpitations, lower extremity edema  GI: No abdominal pain, No N/V/D or constipation, No heartburn or reflux  GU:  +dysuria, +frequency or urgency MUSCULOSKELETAL: No unrelieved bone/joint pain NEUROLOGIC: No headache, dizziness or focal weakness PSYCHIATRIC: No overt anxiety. Sleeps well.   Filed Vitals:   09/24/13 2319  BP: 112/64  Pulse: 66   Temp: 97.7 F (36.5 C)  Resp: 18    Physical Exam  GENERAL APPEARANCE: Alert, conversant. Appropriately groomed. No acute distress  SKIN: No diaphoresis  rash HEENT: Unremarkable RESPIRATORY: Breathing is even, unlabored. Lung sounds are clear   CARDIOVASCULAR: Heart RRR no murmurs, rubs or gallops. No peripheral edema  GASTROINTESTINAL: Abdomen is soft, non-tender, not distended w/ normal bowel sounds.  GENITOURINARY: Bladder non tender, not distended  MUSCULOSKELETAL: contractures NEUROLOGIC: Cranial nerves 2-12 grossly intact, old paresis PSYCHIATRIC: Mood and affect appropriate to situation, no behavioral issues                                                                                Patient Active Problem List   Diagnosis Date Noted  . Generalized anxiety disorder 08/26/2013  . Late effects of CVA (cerebrovascular accident) 07/08/2013  . Depression   . Hypotension 05/08/2013  . Fecal occult blood test positive 05/08/2013  . Altered mental status 05/08/2013  . Insomnia 04/26/2013  . UTI (urinary tract infection) 07/02/2012  . Anemia, iron deficiency 07/02/2012  . Ingrown toenail 07/02/2012  . Unspecified constipation 07/02/2012  . 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 Unspecified cerebral artery occlusion with cerebral infarction 04/13/2012  . Orthostatic hypotension 04/06/2012  . Abnormal TSH 04/06/2012  . Spastic hemiplegia affecting dominant side 07/25/2011  . Stroke 05/15/2011  . Hyperlipidemia 05/14/2011  . Tobacco abuse 05/14/2011  . 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 Lung abnormality 05/11/2011  . CVA (cerebral infarction) 05/08/2011  . Hypertension   . Type II or unspecified type diabetes mellitus with neurological manifestations, uncontrolled(250.62)     CBC    Component Value Date/Time   WBC 6.0 05/09/2013 0815   RBC 3.68* 05/09/2013 0815   RBC 2.99* 04/05/2012 0345   HGB 9.6* 05/09/2013 0815   HCT  31.7* 05/09/2013 0815   PLT 247 05/09/2013 0815   MCV 86.1 05/09/2013 0815   LYMPHSABS 2.0 05/07/2013 2223   MONOABS 0.6 05/07/2013 2223   EOSABS 0.1 05/07/2013 2223   BASOSABS 0.0 05/07/2013 2223    CMP     Component Value Date/Time   NA 140 05/09/2013 0815   K 4.3 05/09/2013 0815   CL 104 05/09/2013 0815   CO2 23 05/09/2013 0815   GLUCOSE 136* 05/09/2013 0815   BUN 11 05/09/2013 0815   CREATININE 0.63 05/09/2013 0815   CALCIUM 10.0 05/09/2013 0815   PROT 6.6 05/07/2013 2223   ALBUMIN 3.3* 05/07/2013 2223   AST 32 05/07/2013 2223   ALT 20 05/07/2013 2223   ALKPHOS 58 05/07/2013 2223   BILITOT 0.2* 05/07/2013 2223   GFRNONAA >90 05/09/2013 0815   GFRAA >90 05/09/2013 0815    Assessment and Plan  UTI (urinary tract infection) Pt c/o dysuria for a month; MIC came back today + for kliebsiella, sensitive so Cipro 500 mg BID for 7 days; pt already has been on pyridium for comfort   Hypotension Midodrine to continue  Late effects of CVA (cerebrovascular accident) No changes  DIARRHEA - which is unusual for pt as constipation has always been the problem-and may still be because pt has been on so many meds for constipation;they may be the problem> Pt seems to think -stool cx and c dif ordered  Hennie Duos, MD

## 2013-09-25 ENCOUNTER — Encounter: Payer: Self-pay | Admitting: Internal Medicine

## 2013-09-25 NOTE — Assessment & Plan Note (Signed)
Pt c/o dysuria for a month; MIC came back today + for kliebsiella, sensitive so Cipro 500 mg BID for 7 days; pt already has been on pyridium for comfort

## 2013-09-25 NOTE — Assessment & Plan Note (Signed)
Midodrine to continue

## 2013-09-25 NOTE — Assessment & Plan Note (Signed)
No changes

## 2013-09-27 ENCOUNTER — Telehealth (HOSPITAL_COMMUNITY): Payer: Self-pay | Admitting: Interventional Radiology

## 2013-09-27 NOTE — Telephone Encounter (Signed)
Called pt, spoke to her daughter, told her that she would be due for another US carotid study in 6 month's time. She states understanding and is in agreement with this plan of care. JM

## 2013-10-03 ENCOUNTER — Non-Acute Institutional Stay (SKILLED_NURSING_FACILITY): Payer: Medicare Other | Admitting: Nurse Practitioner

## 2013-10-03 DIAGNOSIS — K5900 Constipation, unspecified: Secondary | ICD-10-CM

## 2013-10-03 DIAGNOSIS — E1149 Type 2 diabetes mellitus with other diabetic neurological complication: Secondary | ICD-10-CM | POA: Diagnosis not present

## 2013-10-03 DIAGNOSIS — G8110 Spastic hemiplegia affecting unspecified side: Secondary | ICD-10-CM

## 2013-10-03 DIAGNOSIS — N3000 Acute cystitis without hematuria: Secondary | ICD-10-CM | POA: Diagnosis not present

## 2013-10-03 DIAGNOSIS — F411 Generalized anxiety disorder: Secondary | ICD-10-CM

## 2013-10-03 DIAGNOSIS — A0472 Enterocolitis due to Clostridium difficile, not specified as recurrent: Secondary | ICD-10-CM | POA: Diagnosis not present

## 2013-10-03 NOTE — Progress Notes (Signed)
Patient ID: Chelsey Gallagher, female   DOB: 05-26-1950, 63 y.o.   MRN: [id number]    Nursing Home Location:  [place] of Service: SNF (31)  PCP: Hennie Duos, MD  Allergies  Allergen Reactions  . Codeine Other (See Comments)    Per Mar  . Flexeril [Cyclobenzaprine Hcl] Other (See Comments)    Per Mar  . Lyrica [Pregabalin] Other (See Comments)    "wires my up"    Chief Complaint  Patient presents with  . Medical Management of Chronic Issues    HPI:  63 year old female with a pmh of CVA with spastic hemiplegia, constipation, anxiety, DM, HTN, and hyperlipidemia is being seen today for routine follow up on chronic conditions  Pt was seen last week due to dysuria and diarrhea. Diarrhea has improved and pt reports no BM for several days until today when she had a "huge one" Reports biofreeze improving her knee pain when applied but has ankle pain on the right and would like to try it there.  Dysuria has improved significantly but still burning at the end of urination.  Keeping herself hydrated Review of Systems:  Review of Systems  Constitutional: Negative for weight loss and malaise/fatigue.  HENT: Negative for congestion and sore throat.   Eyes: Negative for blurred vision.  Respiratory: Negative for cough, sputum production and shortness of breath.   Cardiovascular: Negative for chest pain, palpitations and leg swelling.  Gastrointestinal: Negative for heartburn, abdominal pain, diarrhea and constipation (improved on current medications ).  Genitourinary: Positive for dysuria (improved but still present ). Negative for urgency and frequency.  Musculoskeletal: Positive for myalgias (legs and right arm and hand).  Skin: Negative.   Neurological: Positive for dizziness (occasionally). Negative for tingling, sensory change, speech change, weakness and headaches.  Psychiatric/Behavioral: Negative for depression. The patient does not have insomnia.       Past Medical History  Diagnosis Date  . Diabetes mellitus   . Hypertension   . PAD (peripheral artery disease)     S/p bypass grafting, unclear exactly where  . Nephrolithiasis   . Hyperlipidemia 05/14/2011  . Hearing loss   . Complication of anesthesia     doesn't wake up good- "usually end up in ICU" also experiences post op nausea and vomiting  . Orthostatic hypotension     after surgery.  Marland Kitchen Heart murmur     PCP- [facility]- No tx needed.  . Stroke 05/2011    Weakness on Right. Wears leg brace.  . Foot drop, left   . H/O hiatal hernia     second one  . Diabetic neuropathy     Diabetic neuropathy- has inproved since she quit smoking.  . Arthritis   . Depression    Past Surgical History  Procedure Laterality Date  . Total hip arthroplasty  2008    Right  . Abdominal hysterectomy  2003  . Cesarean section  1984  . Cholecystectomy  1984  . Bypass graft  2003    Abdominal aortic  . Kidney stone surgery  1999  . Appendectomy  2011  . Hernia repair  2011  . Lithotripsy    . Abdominal hysterectomy    . Esophagogastroduodenoscopy N/A 05/09/2013    Procedure: ESOPHAGOGASTRODUODENOSCOPY (EGD);  Surgeon: Lafayette Dragon, MD;  Location: [facility] ENDOSCOPY;  Service: Endoscopy;  Laterality: N/A;  . Colonoscopy N/A 05/10/2013    Procedure: COLONOSCOPY;  Surgeon: Lafayette Dragon, MD;  Location: [facility] ENDOSCOPY;  Service: Endoscopy;  Laterality: N/A;   Social History:   reports that she has quit smoking. Her smoking use included Cigarettes. She smoked 0.00 packs per day for 45 years. She quit smokeless tobacco use about 2 years ago. She reports that she does not drink alcohol or use illicit drugs.  Family History  Problem Relation Age of Onset  . Cancer Maternal Aunt     breast    Medications: Patient's Medications  New Prescriptions   No medications on file  Previous Medications   ACETAMINOPHEN (TYLENOL) 325 MG TABLET    Take 650 mg by mouth every 6 (six) hours as needed  for pain.   ASPIRIN EC 81 MG TABLET    Take 81 mg by mouth daily.   BACLOFEN (LIORESAL) 10 MG TABLET    Take 5 mg by mouth 2 (two) times daily. 5 mg at 8a and 4pm   BACLOFEN (LIORESAL) 20 MG TABLET    Take 20 mg by mouth at bedtime.   CHOLECALCIFEROL (VITAMIN D) 1000 UNITS TABLET    Take 1,000 Units by mouth daily.   CLOPIDOGREL (PLAVIX) 75 MG TABLET    Take 75 mg by mouth daily.   DULOXETINE (CYMBALTA) 30 MG CAPSULE    Take 60 mg by mouth daily.   FERROUS SULFATE 325 (65 FE) MG TABLET    Take 325 mg by mouth 3 (three) times daily.    GABAPENTIN (NEURONTIN) 300 MG CAPSULE    Take 300 mg by mouth at bedtime.   INSULIN GLARGINE (LANTUS) 100 UNIT/ML INJECTION    Inject 12 Units into the skin at bedtime.    LUBIPROSTONE (AMITIZA) 24 MCG CAPSULE    Take 24 mcg by mouth 2 (two) times daily with a meal.   MAGNESIUM HYDROXIDE (MILK OF MAGNESIA) 800 MG/5ML SUSPENSION    Take 30 mLs by mouth daily as needed for constipation. 30cc   MENTHOL, TOPICAL ANALGESIC, (BIOFREEZE) 4 % GEL    To right ankle and bilateral knees TID   MENTHOL-CETYLPYRIDINIUM (CEPACOL) 3 MG LOZENGE    Take 1 lozenge by mouth every 4 (four) hours as needed for sore throat.   METFORMIN (GLUCOPHAGE) 500 MG TABLET    Take 1,000 mg by mouth 2 (two) times daily with a meal.    METHOCARBAMOL (ROBAXIN) 500 MG TABLET    Take 1 tablet (500 mg total) by mouth at bedtime.   MIDODRINE (PROAMATINE) 5 MG TABLET    Take 5 mg by mouth 3 (three) times daily.   MIRABEGRON ER (MYRBETRIQ) 25 MG TB24 TABLET    Take 1 tablet (25 mg total) by mouth daily.   POLYETHYLENE GLYCOL (MIRALAX / GLYCOLAX) PACKET    Take 17 g by mouth daily as needed for mild constipation.    PRAVASTATIN (PRAVACHOL) 40 MG TABLET    Take 40 mg by mouth daily.    TRAMADOL (ULTRAM) 50 MG TABLET    Take 2 tablets (100 mg total) by mouth every 8 (eight) hours as needed.   TRAMADOL (ULTRAM-ER) 100 MG 24 HR TABLET    Take 2 tablets by mouth every 8 hours as needed.   WHEAT DEXTRIN  (BENEFIBER) POWD    Take 1 Applicatorful by mouth daily. Mix with 8oz of fluid  Modified Medications   No medications on file  Discontinued Medications   SENNOSIDES-DOCUSATE SODIUM (SENOKOT-S) 8.6-50 MG TABLET    Take 1 tablet by mouth 2 (two) times daily.     Physical Exam:  Physical Exam  Vitals reviewed. Constitutional: She is oriented to person, place, and time and well-developed, well-nourished, and in no distress. No distress.  HENT:  Nose: Nose normal.  Mouth/Throat: Oropharynx is clear and moist.  Eyes: Conjunctivae and EOM are normal. Pupils are equal, round, and reactive to light.  Neck: Normal range of motion. Neck supple.  Cardiovascular: Normal rate and regular rhythm.   Murmur heard. Pulmonary/Chest: Effort normal and breath sounds normal. No respiratory distress. She has no wheezes.  Abdominal: Soft. Bowel sounds are normal. She exhibits no distension.  Musculoskeletal: She exhibits no edema and no tenderness.  Neurological: She is alert and oriented to person, place, and time. She exhibits abnormal muscle tone. Coordination abnormal.  Right sided hemiparesis with contracture to right wrist- in brace  Skin: Skin is warm and dry. She is not diaphoretic.  Psychiatric: Affect normal.    Filed Vitals:   10/03/13 1337  BP: 119/66  Pulse: 67  Temp: 97 F (36.1 C)  Resp: 20  Weight: 152 lb (68.947 kg)      Labs reviewed: Basic Metabolic Panel:  Recent Labs  05/07/13 2223 05/09/13 0815  NA 132* 140  K 4.6 4.3  CL 94* 104  CO2 23 23  GLUCOSE 167* 136*  BUN 20 11  CREATININE 0.82 0.63  CALCIUM 9.8 10.0   Liver Function Tests:  Recent Labs  05/07/13 2223  AST 32  ALT 20  ALKPHOS 58  BILITOT 0.2*  PROT 6.6  ALBUMIN 3.3*   No results found for this basename: LIPASE, AMYLASE,  in the last 8760 hours No results found for this basename: AMMONIA,  in the last 8760 hours CBC:  Recent Labs  05/07/13 2223 05/09/13 0815  WBC 8.1 6.0  NEUTROABS 5.4   --   HGB 8.6* 9.6*  HCT 27.9* 31.7*  MCV 86.6 86.1  PLT 253 247   Cardiac Enzymes:  Recent Labs  05/07/13 2227  TROPONINI <0.30   BNP: No components found with this basename: POCBNP,  CBG:  Recent Labs  05/10/13 0810 05/10/13 1206 05/10/13 1611  GLUCAP 130* 121* 215*   TSH: No results found for this basename: TSH,  in the last 8760 hours A1C: Lab Results  Component Value Date   HGBA1C 5.3 04/05/2012  CBC NO Diff (Complete Blood Count)  Result: 04/11/2013 4:52 PM ( Status: F ) C  WBC 6.6 4.0-10.5 K/uL SLN  RBC 2.44 L 4.22-5.81 MIL/uL SLN  Hemoglobin 5.6 LL 13.0-17.0 g/dL SLN C  Hematocrit 18.7 L 39.0-52.0 % SLN  MCV 76.3 L 78.0-100.0 fL SLN  MCH 23.0 L 26.0-34.0 pg SLN  MCHC 30.5 30.0-36.0 g/dL SLN  RDW 17.5 H 11.5-15.5 % SLN  Platelet Count 243 150-400 K/uL SLN  Basic Metabolic Panel  Result: 8/0/9983 8:54 PM ( Status: F )  Sodium 133 L 135-145 mEq/L SLN  Potassium 5.7 H 3.5-5.3 mEq/L SLN C  Chloride 101 96-112 mEq/L SLN  CO2 26 19-32 mEq/L SLN  Glucose 93 70-99 mg/dL SLN  BUN 23 6-23 mg/dL SLN  Creatinine 0.78 0.50-1.35 mg/dL SLN  Calcium 9.3 8.4-10.5 mg/dL SLN  Lipid Profile  Result: 04/11/2013 8:54 PM ( Status: F )  Cholesterol 118 0-200 mg/dL SLN C  Triglyceride 119 <150 mg/dL SLN  HDL Cholesterol 34 L >39 mg/dL SLN  Total Chol/HDL Ratio 3.5 Ratio SLN  VLDL Cholesterol (Calc) 24 0-40 mg/dL SLN  LDL Cholesterol (Calc) 60 0-99 mg/dL SLN C  Hemoglobin A1C  Result: 04/12/2013 2:17 PM (  Status: F )  Hemoglobin A1C 5.5 <5.7 % SLN C  Estimated Average Glucose 111  CBC with Diff  Result: 06/06/2013 3:02 PM ( Status: F ) C  WBC 6.1 4.0-10.5 K/uL SLN  RBC 3.83 L 3.87-5.11 MIL/uL SLN  Hemoglobin 10.4 L 12.0-15.0 g/dL SLN  Hematocrit 33.1 L 36.0-46.0 % SLN  MCV 86.4 78.0-100.0 fL SLN  MCH 27.2 26.0-34.0 pg SLN  MCHC 31.4 30.0-36.0 g/dL SLN  RDW 19.5 H 11.5-15.5 % SLN  Platelet Count 240 150-400 K/uL SLN  Granulocyte % 62 43-77 % SLN  Absolute Gran 3.8  1.7-7.7 K/uL SLN  Lymph % 27 12-46 % SLN  Absolute Lymph 1.6 0.7-4.0 K/uL SLN  Mono % 9 3-12 % SLN  Absolute Mono 0.5 0.1-1.0 K/uL SLN  Eos % 2 0-5 % SLN  Absolute Eos 0.1 0.0-0.7 K/uL SLN  Baso % 0 0-1 % SLN  Absolute Baso 0.0 0.0-0.1 K/uL SLN  Smear Review Criteria for review not met  Basic Metabolic Panel    Result: 08/08/2013 4:40 PM   ( Status: F )     C Sodium 134   L 135-145 mEq/L SLN   Potassium 4.6     3.5-5.3 mEq/L SLN   Chloride 104     96-112 mEq/L SLN   CO2 22     19-32 mEq/L SLN   Glucose 159   H 70-99 mg/dL SLN   BUN 14     6-23 mg/dL SLN   Creatinine 0.63     0.50-1.10 mg/dL SLN   Calcium 9.5     8.4-10.5 mg/dL SLN   Lipid Profile    Result: 08/08/2013 4:40 PM   ( Status: F )       Cholesterol 184     0-200 mg/dL SLN C Triglyceride 317   H <150 mg/dL SLN   HDL Cholesterol 37   L >39 mg/dL SLN   Total Chol/HDL Ratio 5.0      Ratio SLN   VLDL Cholesterol (Calc) 63   H 0-40 mg/dL SLN   LDL Cholesterol (Calc) 84     0-99 mg/dL SLN C Liver Profile    Result: 08/08/2013 4:40 PM   ( Status: F )       Bilirubin, Total 0.3     0.2-1.2 mg/dL SLN   Bilirubin, Direct 0.1     0.0-0.3 mg/dL SLN   Indirect Bilirubin 0.2     0.2-1.2 mg/dL SLN   Alkaline Phosphatase 73     39-117 U/L SLN   AST/SGOT 17     0-37 U/L SLN   ALT/SGPT 26     0-35 U/L SLN   Total Protein 6.8     6.0-8.3 g/dL SLN   Albumin 3.6     3.5-5.2 g/dL SLN   Hemoglobin A1C    Result: 08/08/2013 10:44 PM   ( Status: F )       Hemoglobin A1C 6.5   H <5.7 % SLN C Estimated Average Glucose 140   H <117 mg/dL SLN  TSH, Ultrasensitive    Result: 08/18/2013 3:50 PM   ( Status: F )       TSH 0.623     0.350-4.500 uIU/mL SLN   Vitamin D (25-Hydroxy)    Result: 08/19/2013 1:10 AM   ( Status: F )       Vitamin D (25-Hydroxy) 32     30-89 ng/mL SLN C Assessment/Plan  1. Type II or unspecified type diabetes mellitus with neurological manifestations,  uncontrolled(250.62) Well controlled on current regimen, will decrease  CBGs to daily in AM only  2. Spastic hemiplegia affecting dominant side unchanged  3. Acute cystitis without hematuria positive for UTI completes cipro today, will order follow up culture due to on-going dysuria  4. Unspecified constipation -had diarrhea, senna was stopped , now  has resolved, had not had BM in several days until today -may need to restart senna but will monitor for now  5. Generalized anxiety disorder Stable, reports on-going agitation at times and she uses PRN medication which is effective.   6. 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

## 2013-10-04 DIAGNOSIS — N390 Urinary tract infection, site not specified: Secondary | ICD-10-CM | POA: Diagnosis not present

## 2013-10-07 DIAGNOSIS — I69959 Hemiplegia and hemiparesis following unspecified cerebrovascular disease affecting unspecified side: Secondary | ICD-10-CM | POA: Diagnosis not present

## 2013-10-07 DIAGNOSIS — R279 Unspecified lack of coordination: Secondary | ICD-10-CM | POA: Diagnosis not present

## 2013-10-07 DIAGNOSIS — M6281 Muscle weakness (generalized): Secondary | ICD-10-CM | POA: Diagnosis not present

## 2013-10-07 DIAGNOSIS — R262 Difficulty in walking, not elsewhere classified: Secondary | ICD-10-CM | POA: Diagnosis not present

## 2013-10-08 DIAGNOSIS — I69959 Hemiplegia and hemiparesis following unspecified cerebrovascular disease affecting unspecified side: Secondary | ICD-10-CM | POA: Diagnosis not present

## 2013-10-08 DIAGNOSIS — R279 Unspecified lack of coordination: Secondary | ICD-10-CM | POA: Diagnosis not present

## 2013-10-08 DIAGNOSIS — R262 Difficulty in walking, not elsewhere classified: Secondary | ICD-10-CM | POA: Diagnosis not present

## 2013-10-08 DIAGNOSIS — M6281 Muscle weakness (generalized): Secondary | ICD-10-CM | POA: Diagnosis not present

## 2013-10-08 DIAGNOSIS — M25569 Pain in unspecified knee: Secondary | ICD-10-CM | POA: Diagnosis not present

## 2013-10-10 DIAGNOSIS — R279 Unspecified lack of coordination: Secondary | ICD-10-CM | POA: Diagnosis not present

## 2013-10-10 DIAGNOSIS — M6281 Muscle weakness (generalized): Secondary | ICD-10-CM | POA: Diagnosis not present

## 2013-10-10 DIAGNOSIS — I69959 Hemiplegia and hemiparesis following unspecified cerebrovascular disease affecting unspecified side: Secondary | ICD-10-CM | POA: Diagnosis not present

## 2013-10-10 DIAGNOSIS — R262 Difficulty in walking, not elsewhere classified: Secondary | ICD-10-CM | POA: Diagnosis not present

## 2013-10-11 DIAGNOSIS — M6281 Muscle weakness (generalized): Secondary | ICD-10-CM | POA: Diagnosis not present

## 2013-10-11 DIAGNOSIS — I69959 Hemiplegia and hemiparesis following unspecified cerebrovascular disease affecting unspecified side: Secondary | ICD-10-CM | POA: Diagnosis not present

## 2013-10-11 DIAGNOSIS — R262 Difficulty in walking, not elsewhere classified: Secondary | ICD-10-CM | POA: Diagnosis not present

## 2013-10-11 DIAGNOSIS — R279 Unspecified lack of coordination: Secondary | ICD-10-CM | POA: Diagnosis not present

## 2013-10-12 DIAGNOSIS — M6281 Muscle weakness (generalized): Secondary | ICD-10-CM | POA: Diagnosis not present

## 2013-10-12 DIAGNOSIS — R279 Unspecified lack of coordination: Secondary | ICD-10-CM | POA: Diagnosis not present

## 2013-10-12 DIAGNOSIS — R262 Difficulty in walking, not elsewhere classified: Secondary | ICD-10-CM | POA: Diagnosis not present

## 2013-10-12 DIAGNOSIS — I69959 Hemiplegia and hemiparesis following unspecified cerebrovascular disease affecting unspecified side: Secondary | ICD-10-CM | POA: Diagnosis not present

## 2013-10-13 DIAGNOSIS — M6281 Muscle weakness (generalized): Secondary | ICD-10-CM | POA: Diagnosis not present

## 2013-10-13 DIAGNOSIS — R262 Difficulty in walking, not elsewhere classified: Secondary | ICD-10-CM | POA: Diagnosis not present

## 2013-10-13 DIAGNOSIS — R279 Unspecified lack of coordination: Secondary | ICD-10-CM | POA: Diagnosis not present

## 2013-10-13 DIAGNOSIS — I69959 Hemiplegia and hemiparesis following unspecified cerebrovascular disease affecting unspecified side: Secondary | ICD-10-CM | POA: Diagnosis not present

## 2013-10-15 DIAGNOSIS — I69959 Hemiplegia and hemiparesis following unspecified cerebrovascular disease affecting unspecified side: Secondary | ICD-10-CM | POA: Diagnosis not present

## 2013-10-15 DIAGNOSIS — R279 Unspecified lack of coordination: Secondary | ICD-10-CM | POA: Diagnosis not present

## 2013-10-15 DIAGNOSIS — R262 Difficulty in walking, not elsewhere classified: Secondary | ICD-10-CM | POA: Diagnosis not present

## 2013-10-15 DIAGNOSIS — M6281 Muscle weakness (generalized): Secondary | ICD-10-CM | POA: Diagnosis not present

## 2013-10-17 ENCOUNTER — Other Ambulatory Visit: Payer: Self-pay | Admitting: Internal Medicine

## 2013-10-17 DIAGNOSIS — Z1231 Encounter for screening mammogram for malignant neoplasm of breast: Secondary | ICD-10-CM

## 2013-10-17 DIAGNOSIS — R262 Difficulty in walking, not elsewhere classified: Secondary | ICD-10-CM | POA: Diagnosis not present

## 2013-10-17 DIAGNOSIS — M6281 Muscle weakness (generalized): Secondary | ICD-10-CM | POA: Diagnosis not present

## 2013-10-17 DIAGNOSIS — R279 Unspecified lack of coordination: Secondary | ICD-10-CM | POA: Diagnosis not present

## 2013-10-17 DIAGNOSIS — I69959 Hemiplegia and hemiparesis following unspecified cerebrovascular disease affecting unspecified side: Secondary | ICD-10-CM | POA: Diagnosis not present

## 2013-10-18 DIAGNOSIS — R262 Difficulty in walking, not elsewhere classified: Secondary | ICD-10-CM | POA: Diagnosis not present

## 2013-10-18 DIAGNOSIS — M6281 Muscle weakness (generalized): Secondary | ICD-10-CM | POA: Diagnosis not present

## 2013-10-18 DIAGNOSIS — I69959 Hemiplegia and hemiparesis following unspecified cerebrovascular disease affecting unspecified side: Secondary | ICD-10-CM | POA: Diagnosis not present

## 2013-10-18 DIAGNOSIS — R279 Unspecified lack of coordination: Secondary | ICD-10-CM | POA: Diagnosis not present

## 2013-10-19 DIAGNOSIS — R262 Difficulty in walking, not elsewhere classified: Secondary | ICD-10-CM | POA: Diagnosis not present

## 2013-10-19 DIAGNOSIS — M6281 Muscle weakness (generalized): Secondary | ICD-10-CM | POA: Diagnosis not present

## 2013-10-19 DIAGNOSIS — R279 Unspecified lack of coordination: Secondary | ICD-10-CM | POA: Diagnosis not present

## 2013-10-19 DIAGNOSIS — I69959 Hemiplegia and hemiparesis following unspecified cerebrovascular disease affecting unspecified side: Secondary | ICD-10-CM | POA: Diagnosis not present

## 2013-10-20 DIAGNOSIS — M6281 Muscle weakness (generalized): Secondary | ICD-10-CM | POA: Diagnosis not present

## 2013-10-20 DIAGNOSIS — R279 Unspecified lack of coordination: Secondary | ICD-10-CM | POA: Diagnosis not present

## 2013-10-20 DIAGNOSIS — I69959 Hemiplegia and hemiparesis following unspecified cerebrovascular disease affecting unspecified side: Secondary | ICD-10-CM | POA: Diagnosis not present

## 2013-10-20 DIAGNOSIS — R262 Difficulty in walking, not elsewhere classified: Secondary | ICD-10-CM | POA: Diagnosis not present

## 2013-10-21 DIAGNOSIS — R279 Unspecified lack of coordination: Secondary | ICD-10-CM | POA: Diagnosis not present

## 2013-10-21 DIAGNOSIS — I69959 Hemiplegia and hemiparesis following unspecified cerebrovascular disease affecting unspecified side: Secondary | ICD-10-CM | POA: Diagnosis not present

## 2013-10-21 DIAGNOSIS — R262 Difficulty in walking, not elsewhere classified: Secondary | ICD-10-CM | POA: Diagnosis not present

## 2013-10-21 DIAGNOSIS — M6281 Muscle weakness (generalized): Secondary | ICD-10-CM | POA: Diagnosis not present

## 2013-10-22 DIAGNOSIS — R262 Difficulty in walking, not elsewhere classified: Secondary | ICD-10-CM | POA: Diagnosis not present

## 2013-10-22 DIAGNOSIS — I69959 Hemiplegia and hemiparesis following unspecified cerebrovascular disease affecting unspecified side: Secondary | ICD-10-CM | POA: Diagnosis not present

## 2013-10-22 DIAGNOSIS — R279 Unspecified lack of coordination: Secondary | ICD-10-CM | POA: Diagnosis not present

## 2013-10-22 DIAGNOSIS — M6281 Muscle weakness (generalized): Secondary | ICD-10-CM | POA: Diagnosis not present

## 2013-10-24 ENCOUNTER — Ambulatory Visit
Admission: RE | Admit: 2013-10-24 | Discharge: 2013-10-24 | Disposition: A | Discharge status: Home / Self Care | Payer: Medicare Other | Source: Ambulatory Visit | Attending: Internal Medicine | Admitting: Internal Medicine

## 2013-10-24 DIAGNOSIS — I69959 Hemiplegia and hemiparesis following unspecified cerebrovascular disease affecting unspecified side: Secondary | ICD-10-CM | POA: Diagnosis not present

## 2013-10-24 DIAGNOSIS — Z1231 Encounter for screening mammogram for malignant neoplasm of breast: Secondary | ICD-10-CM | POA: Diagnosis not present

## 2013-10-24 DIAGNOSIS — R279 Unspecified lack of coordination: Secondary | ICD-10-CM | POA: Diagnosis not present

## 2013-10-24 DIAGNOSIS — R262 Difficulty in walking, not elsewhere classified: Secondary | ICD-10-CM | POA: Diagnosis not present

## 2013-10-24 DIAGNOSIS — M6281 Muscle weakness (generalized): Secondary | ICD-10-CM | POA: Diagnosis not present

## 2013-10-25 DIAGNOSIS — I69959 Hemiplegia and hemiparesis following unspecified cerebrovascular disease affecting unspecified side: Secondary | ICD-10-CM | POA: Diagnosis not present

## 2013-10-25 DIAGNOSIS — M6281 Muscle weakness (generalized): Secondary | ICD-10-CM | POA: Diagnosis not present

## 2013-10-25 DIAGNOSIS — R279 Unspecified lack of coordination: Secondary | ICD-10-CM | POA: Diagnosis not present

## 2013-10-25 DIAGNOSIS — R262 Difficulty in walking, not elsewhere classified: Secondary | ICD-10-CM | POA: Diagnosis not present

## 2013-10-26 DIAGNOSIS — R262 Difficulty in walking, not elsewhere classified: Secondary | ICD-10-CM | POA: Diagnosis not present

## 2013-10-26 DIAGNOSIS — M6281 Muscle weakness (generalized): Secondary | ICD-10-CM | POA: Diagnosis not present

## 2013-10-26 DIAGNOSIS — R279 Unspecified lack of coordination: Secondary | ICD-10-CM | POA: Diagnosis not present

## 2013-10-26 DIAGNOSIS — I69959 Hemiplegia and hemiparesis following unspecified cerebrovascular disease affecting unspecified side: Secondary | ICD-10-CM | POA: Diagnosis not present

## 2013-10-27 DIAGNOSIS — M6281 Muscle weakness (generalized): Secondary | ICD-10-CM | POA: Diagnosis not present

## 2013-10-27 DIAGNOSIS — R262 Difficulty in walking, not elsewhere classified: Secondary | ICD-10-CM | POA: Diagnosis not present

## 2013-10-27 DIAGNOSIS — F3289 Other specified depressive episodes: Secondary | ICD-10-CM | POA: Diagnosis not present

## 2013-10-27 DIAGNOSIS — F329 Major depressive disorder, single episode, unspecified: Secondary | ICD-10-CM | POA: Diagnosis not present

## 2013-10-27 DIAGNOSIS — R279 Unspecified lack of coordination: Secondary | ICD-10-CM | POA: Diagnosis not present

## 2013-10-27 DIAGNOSIS — F411 Generalized anxiety disorder: Secondary | ICD-10-CM | POA: Diagnosis not present

## 2013-10-27 DIAGNOSIS — I69959 Hemiplegia and hemiparesis following unspecified cerebrovascular disease affecting unspecified side: Secondary | ICD-10-CM | POA: Diagnosis not present

## 2013-10-28 DIAGNOSIS — R262 Difficulty in walking, not elsewhere classified: Secondary | ICD-10-CM | POA: Diagnosis not present

## 2013-10-28 DIAGNOSIS — R279 Unspecified lack of coordination: Secondary | ICD-10-CM | POA: Diagnosis not present

## 2013-10-28 DIAGNOSIS — E119 Type 2 diabetes mellitus without complications: Secondary | ICD-10-CM | POA: Diagnosis not present

## 2013-10-28 DIAGNOSIS — M6281 Muscle weakness (generalized): Secondary | ICD-10-CM | POA: Diagnosis not present

## 2013-10-28 DIAGNOSIS — I69959 Hemiplegia and hemiparesis following unspecified cerebrovascular disease affecting unspecified side: Secondary | ICD-10-CM | POA: Diagnosis not present

## 2013-10-28 DIAGNOSIS — M79609 Pain in unspecified limb: Secondary | ICD-10-CM | POA: Diagnosis not present

## 2013-10-29 DIAGNOSIS — R262 Difficulty in walking, not elsewhere classified: Secondary | ICD-10-CM | POA: Diagnosis not present

## 2013-10-29 DIAGNOSIS — I69959 Hemiplegia and hemiparesis following unspecified cerebrovascular disease affecting unspecified side: Secondary | ICD-10-CM | POA: Diagnosis not present

## 2013-10-29 DIAGNOSIS — R279 Unspecified lack of coordination: Secondary | ICD-10-CM | POA: Diagnosis not present

## 2013-10-29 DIAGNOSIS — M6281 Muscle weakness (generalized): Secondary | ICD-10-CM | POA: Diagnosis not present

## 2013-10-30 ENCOUNTER — Encounter: Payer: Self-pay | Admitting: Internal Medicine

## 2013-10-30 ENCOUNTER — Non-Acute Institutional Stay (SKILLED_NURSING_FACILITY): Payer: Medicare Other | Admitting: Internal Medicine

## 2013-10-30 DIAGNOSIS — I951 Orthostatic hypotension: Secondary | ICD-10-CM | POA: Diagnosis not present

## 2013-10-30 DIAGNOSIS — D509 Iron deficiency anemia, unspecified: Secondary | ICD-10-CM

## 2013-10-30 DIAGNOSIS — I6350 Cerebral infarction due to unspecified occlusion or stenosis of unspecified cerebral artery: Secondary | ICD-10-CM

## 2013-10-30 DIAGNOSIS — E1149 Type 2 diabetes mellitus with other diabetic neurological complication: Secondary | ICD-10-CM

## 2013-10-30 DIAGNOSIS — G8110 Spastic hemiplegia affecting unspecified side: Secondary | ICD-10-CM

## 2013-10-30 DIAGNOSIS — F32A Depression, unspecified: Secondary | ICD-10-CM

## 2013-10-30 DIAGNOSIS — E785 Hyperlipidemia, unspecified: Secondary | ICD-10-CM

## 2013-10-30 DIAGNOSIS — F329 Major depressive disorder, single episode, unspecified: Secondary | ICD-10-CM

## 2013-10-30 DIAGNOSIS — F3289 Other specified depressive episodes: Secondary | ICD-10-CM

## 2013-10-30 NOTE — Assessment & Plan Note (Signed)
BP is just right today and will continue midodrine.

## 2013-10-30 NOTE — Assessment & Plan Note (Signed)
Continue iron  

## 2013-10-30 NOTE — Assessment & Plan Note (Signed)
Pt on ASA only and statin

## 2013-10-30 NOTE — Assessment & Plan Note (Signed)
Unchanged

## 2013-10-30 NOTE — Assessment & Plan Note (Signed)
Will have Psych to re-eval;pt has had many c/o in past few months, stops me in hall frequently, she's unhappy I think;supportive care during this

## 2013-10-30 NOTE — Assessment & Plan Note (Signed)
Pt is OK with regimen, would like accucheck daily after breakfast and this is OK and ordered.

## 2013-10-30 NOTE — Progress Notes (Signed)
MRN: [id number] Name: Chelsey Gallagher  Sex: female Age: 63 y.o. DOB: 25-Dec-1950  [place] #: [name] Facility/Room: 126A Level Of Care: SNF Provider: Inocencio Homes D Emergency Contacts: Extended Emergency Contact Information Primary Emergency Contact: Chelsey Gallagher of [place] Phone: [phone] Mobile Phone: [phone] Relation: Daughter  Code Status:DNR   Allergies: Codeine; Flexeril; and Lyrica  Chief Complaint  Patient presents with  . Medical Management of Chronic Issues    HPI: Patient is 63 y.o. female who is being seen for routine issues.   Past Medical History  Diagnosis Date  . Diabetes mellitus   . Hypertension   . PAD (peripheral artery disease)     S/p bypass grafting, unclear exactly where  . Nephrolithiasis   . Hyperlipidemia 05/14/2011  . Hearing loss   . Complication of anesthesia     doesn't wake up good- "usually end up in ICU" also experiences post op nausea and vomiting  . Orthostatic hypotension     after surgery.  Marland Kitchen Heart murmur     PCP- [facility]- No tx needed.  . Stroke 05/2011    Weakness on Right. Wears leg brace.  . Foot drop, left   . H/O hiatal hernia     second one  . Diabetic neuropathy     Diabetic neuropathy- has inproved since she quit smoking.  . Arthritis   . Depression     Past Surgical History  Procedure Laterality Date  . Total hip arthroplasty  2008    Right  . Abdominal hysterectomy  2003  . Cesarean section  1984  . Cholecystectomy  1984  . Bypass graft  2003    Abdominal aortic  . Kidney stone surgery  1999  . Appendectomy  2011  . Hernia repair  2011  . Lithotripsy    . Abdominal hysterectomy    . Esophagogastroduodenoscopy N/A 05/09/2013    Procedure: ESOPHAGOGASTRODUODENOSCOPY (EGD);  Surgeon: Chelsey Dragon, MD;  Location: [facility] ENDOSCOPY;  Service: Endoscopy;  Laterality: N/A;  . Colonoscopy N/A 05/10/2013    Procedure: COLONOSCOPY;  Surgeon: Chelsey Dragon, MD;  Location: [facility]  ENDOSCOPY;  Service: Endoscopy;  Laterality: N/A;      Medication List       This list is accurate as of: 10/30/13  4:47 PM.  Always use your most recent med list.               acetaminophen 325 MG tablet  Commonly known as:  TYLENOL  Take 650 mg by mouth every 6 (six) hours as needed for pain.     aspirin EC 81 MG tablet  Take 81 mg by mouth daily.     baclofen 20 MG tablet  Commonly known as:  LIORESAL  Take 20 mg by mouth at bedtime.     baclofen 10 MG tablet  Commonly known as:  LIORESAL  Take 5 mg by mouth 2 (two) times daily. 5 mg at 8a and 4pm     BENEFIBER Powd  Take 1 Applicatorful by mouth daily. Mix with 8oz of fluid     cholecalciferol 1000 UNITS tablet  Commonly known as:  VITAMIN D  Take 1,000 Units by mouth daily.     clopidogrel 75 MG tablet  Commonly known as:  PLAVIX  Take 75 mg by mouth daily.     DULoxetine 30 MG capsule  Commonly known as:  CYMBALTA  Take 60 mg by mouth daily.     ferrous sulfate 325 (65  FE) MG tablet  Take 325 mg by mouth 3 (three) times daily.     gabapentin 300 MG capsule  Commonly known as:  NEURONTIN  Take 300 mg by mouth at bedtime.     insulin glargine 100 UNIT/ML injection  Commonly known as:  LANTUS  Inject 12 Units into the skin at bedtime.     lubiprostone 24 MCG capsule  Commonly known as:  AMITIZA  Take 24 mcg by mouth 2 (two) times daily with a meal.     magnesium hydroxide 800 MG/5ML suspension  Commonly known as:  MILK OF MAGNESIA  Take 30 mLs by mouth daily as needed for constipation. 30cc     Menthol (Topical Analgesic) 4 % Gel  Commonly known as:  BIOFREEZE  To right ankle and bilateral knees TID     menthol-cetylpyridinium 3 MG lozenge  Commonly known as:  CEPACOL  Take 1 lozenge by mouth every 4 (four) hours as needed for sore throat.     metFORMIN 500 MG tablet  Commonly known as:  GLUCOPHAGE  Take 1,000 mg by mouth 2 (two) times daily with a meal.     methocarbamol 500 MG tablet   Commonly known as:  ROBAXIN  Take 1 tablet (500 mg total) by mouth at bedtime.     midodrine 5 MG tablet  Commonly known as:  PROAMATINE  Take 5 mg by mouth 3 (three) times daily.     mirabegron ER 25 MG Tb24 tablet  Commonly known as:  MYRBETRIQ  Take 1 tablet (25 mg total) by mouth daily.     polyethylene glycol packet  Commonly known as:  MIRALAX / GLYCOLAX  Take 17 g by mouth daily as needed for mild constipation.     pravastatin 40 MG tablet  Commonly known as:  PRAVACHOL  Take 40 mg by mouth daily.     traMADol 100 MG 24 hr tablet  Commonly known as:  ULTRAM-ER  Take 2 tablets by mouth every 8 hours as needed.     traMADol 50 MG tablet  Commonly known as:  ULTRAM  Take 2 tablets (100 mg total) by mouth every 8 (eight) hours as needed.        No orders of the defined types were placed in this encounter.    Immunization History  Administered Date(s) Administered  . Influenza-Unspecified 12/04/2011  . Pneumococcal Polysaccharide-23 09/03/2011  . Pneumococcal-Unspecified 12/02/2010    History  Substance Use Topics  . Smoking status: Former Smoker -- 0.00 packs/day for 45 years    Types: Cigarettes  . Smokeless tobacco: Former Systems developer    Quit date: 05/08/2011  . Alcohol Use: No    Review of Systems  DATA OBTAINED: from patient; would like to cut down BS checks to daily after breakfast only GENERAL: Feels well no fevers, fatigue, appetite changes SKIN: No itching, rash HEENT: No complaint RESPIRATORY: No cough, wheezing, SOB CARDIAC: No chest pain, palpitations, lower extremity edema  GI: No abdominal pain, No N/V/D or constipation, No heartburn or reflux  GU: No dysuria, frequency or urgency, or incontinence  MUSCULOSKELETAL: No unrelieved bone/joint pain NEUROLOGIC: No headache, dizziness or focal weakness PSYCHIATRIC:  Sleeps OK   Filed Vitals:   10/10/13 1625  BP: 118/82  Pulse: 62  Temp: 97.4 F (36.3 C)  Resp: 18    Physical Exam  GENERAL  APPEARANCE: Alert, conversant. Appropriately groomed. No acute distress  SKIN: No diaphoresis rash HEENT: Unremarkable RESPIRATORY: Breathing is even, unlabored. Lung sounds  are clear   CARDIOVASCULAR: Heart RRR no murmurs, rubs or gallops. No peripheral edema  GASTROINTESTINAL: Abdomen is soft, non-tender, not distended w/ normal bowel sounds.  GENITOURINARY: Bladder non tender, not distended  MUSCULOSKELETAL: contractures on R NEUROLOGIC: Cranial nerves 2-12 grossly intact; R hemiparesis PSYCHIATRIC: Mood and affect appropriate to situation, no behavioral issues  Patient Active Problem List   Diagnosis Date Noted  . Generalized anxiety disorder 08/26/2013  . Late effects of CVA (cerebrovascular accident) 07/08/2013  . Depression   . Hypotension 05/08/2013  . Fecal occult blood test positive 05/08/2013  . Altered mental status 05/08/2013  . Insomnia 04/26/2013  . UTI (urinary tract infection) 07/02/2012  . Anemia, iron deficiency 07/02/2012  . Ingrown toenail 07/02/2012  . Unspecified constipation 07/02/2012  . 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 Unspecified cerebral artery occlusion with cerebral infarction 04/13/2012  . Orthostatic hypotension 04/06/2012  . Abnormal TSH 04/06/2012  . Spastic hemiplegia affecting dominant side 07/25/2011  . Stroke 05/15/2011  . Hyperlipidemia 05/14/2011  . Tobacco abuse 05/14/2011  . 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 Lung abnormality 05/11/2011  . CVA (cerebral infarction) 05/08/2011  . Hypertension   . Type II or unspecified type diabetes mellitus with neurological manifestations, uncontrolled(250.62)     CBC    Component Value Date/Time   WBC 6.0 05/09/2013 0815   RBC 3.68* 05/09/2013 0815   RBC 2.99* 04/05/2012 0345   HGB 9.6* 05/09/2013 0815   HCT 31.7* 05/09/2013 0815   PLT 247 05/09/2013 0815   MCV 86.1 05/09/2013 0815   LYMPHSABS 2.0 05/07/2013 2223   MONOABS 0.6 05/07/2013 2223   EOSABS  0.1 05/07/2013 2223   BASOSABS 0.0 05/07/2013 2223    CMP     Component Value Date/Time   NA 140 05/09/2013 0815   K 4.3 05/09/2013 0815   CL 104 05/09/2013 0815   CO2 23 05/09/2013 0815   GLUCOSE 136* 05/09/2013 0815   BUN 11 05/09/2013 0815   CREATININE 0.63 05/09/2013 0815   CALCIUM 10.0 05/09/2013 0815   PROT 6.6 05/07/2013 2223   ALBUMIN 3.3* 05/07/2013 2223   AST 32 05/07/2013 2223   ALT 20 05/07/2013 2223   ALKPHOS 58 05/07/2013 2223   BILITOT 0.2* 05/07/2013 2223   GFRNONAA >90 05/09/2013 0815   GFRAA >90 05/09/2013 0815    Assessment and Plan  Type II or unspecified type diabetes mellitus with neurological manifestations, uncontrolled(250.62) Pt is OK with regimen, would like accucheck daily after breakfast and this is OK and ordered.  Orthostatic hypotension BP is just right today and will continue midodrine.  Unspecified cerebral artery occlusion with cerebral infarction Pt on ASA only and statin  Spastic hemiplegia affecting dominant side Unchanged  Anemia, iron deficiency Continue iron  Depression Will have Psych to re-eval;pt has had many c/o in past few months, stops me in hall frequently, she's unhappy I think;supportive care during this  Hyperlipidemia Continue pravachol    Hennie Duos, MD

## 2013-10-30 NOTE — Assessment & Plan Note (Signed)
Continue pravachol.  

## 2013-10-31 DIAGNOSIS — M6281 Muscle weakness (generalized): Secondary | ICD-10-CM | POA: Diagnosis not present

## 2013-10-31 DIAGNOSIS — I69959 Hemiplegia and hemiparesis following unspecified cerebrovascular disease affecting unspecified side: Secondary | ICD-10-CM | POA: Diagnosis not present

## 2013-10-31 DIAGNOSIS — R279 Unspecified lack of coordination: Secondary | ICD-10-CM | POA: Diagnosis not present

## 2013-10-31 DIAGNOSIS — R262 Difficulty in walking, not elsewhere classified: Secondary | ICD-10-CM | POA: Diagnosis not present

## 2013-11-01 DIAGNOSIS — I69959 Hemiplegia and hemiparesis following unspecified cerebrovascular disease affecting unspecified side: Secondary | ICD-10-CM | POA: Diagnosis not present

## 2013-11-01 DIAGNOSIS — M6281 Muscle weakness (generalized): Secondary | ICD-10-CM | POA: Diagnosis not present

## 2013-11-01 DIAGNOSIS — R262 Difficulty in walking, not elsewhere classified: Secondary | ICD-10-CM | POA: Diagnosis not present

## 2013-11-01 DIAGNOSIS — R279 Unspecified lack of coordination: Secondary | ICD-10-CM | POA: Diagnosis not present

## 2013-11-02 DIAGNOSIS — M6281 Muscle weakness (generalized): Secondary | ICD-10-CM | POA: Diagnosis not present

## 2013-11-02 DIAGNOSIS — I69959 Hemiplegia and hemiparesis following unspecified cerebrovascular disease affecting unspecified side: Secondary | ICD-10-CM | POA: Diagnosis not present

## 2013-11-02 DIAGNOSIS — R262 Difficulty in walking, not elsewhere classified: Secondary | ICD-10-CM | POA: Diagnosis not present

## 2013-11-02 DIAGNOSIS — R279 Unspecified lack of coordination: Secondary | ICD-10-CM | POA: Diagnosis not present

## 2013-11-03 DIAGNOSIS — I69959 Hemiplegia and hemiparesis following unspecified cerebrovascular disease affecting unspecified side: Secondary | ICD-10-CM | POA: Diagnosis not present

## 2013-11-03 DIAGNOSIS — R279 Unspecified lack of coordination: Secondary | ICD-10-CM | POA: Diagnosis not present

## 2013-11-03 DIAGNOSIS — M6281 Muscle weakness (generalized): Secondary | ICD-10-CM | POA: Diagnosis not present

## 2013-11-03 DIAGNOSIS — R262 Difficulty in walking, not elsewhere classified: Secondary | ICD-10-CM | POA: Diagnosis not present

## 2013-11-04 DIAGNOSIS — R279 Unspecified lack of coordination: Secondary | ICD-10-CM | POA: Diagnosis not present

## 2013-11-04 DIAGNOSIS — I69959 Hemiplegia and hemiparesis following unspecified cerebrovascular disease affecting unspecified side: Secondary | ICD-10-CM | POA: Diagnosis not present

## 2013-11-04 DIAGNOSIS — M6281 Muscle weakness (generalized): Secondary | ICD-10-CM | POA: Diagnosis not present

## 2013-11-04 DIAGNOSIS — R262 Difficulty in walking, not elsewhere classified: Secondary | ICD-10-CM | POA: Diagnosis not present

## 2013-11-05 DIAGNOSIS — I69959 Hemiplegia and hemiparesis following unspecified cerebrovascular disease affecting unspecified side: Secondary | ICD-10-CM | POA: Diagnosis not present

## 2013-11-05 DIAGNOSIS — M6281 Muscle weakness (generalized): Secondary | ICD-10-CM | POA: Diagnosis not present

## 2013-11-05 DIAGNOSIS — R279 Unspecified lack of coordination: Secondary | ICD-10-CM | POA: Diagnosis not present

## 2013-11-05 DIAGNOSIS — R262 Difficulty in walking, not elsewhere classified: Secondary | ICD-10-CM | POA: Diagnosis not present

## 2013-11-08 DIAGNOSIS — R279 Unspecified lack of coordination: Secondary | ICD-10-CM | POA: Diagnosis not present

## 2013-11-08 DIAGNOSIS — M25549 Pain in joints of unspecified hand: Secondary | ICD-10-CM | POA: Diagnosis not present

## 2013-11-08 DIAGNOSIS — R262 Difficulty in walking, not elsewhere classified: Secondary | ICD-10-CM | POA: Diagnosis not present

## 2013-11-08 DIAGNOSIS — I69959 Hemiplegia and hemiparesis following unspecified cerebrovascular disease affecting unspecified side: Secondary | ICD-10-CM | POA: Diagnosis not present

## 2013-11-08 DIAGNOSIS — M19049 Primary osteoarthritis, unspecified hand: Secondary | ICD-10-CM | POA: Diagnosis not present

## 2013-11-08 DIAGNOSIS — M6281 Muscle weakness (generalized): Secondary | ICD-10-CM | POA: Diagnosis not present

## 2013-11-09 DIAGNOSIS — M6281 Muscle weakness (generalized): Secondary | ICD-10-CM | POA: Diagnosis not present

## 2013-11-09 DIAGNOSIS — R279 Unspecified lack of coordination: Secondary | ICD-10-CM | POA: Diagnosis not present

## 2013-11-09 DIAGNOSIS — I69959 Hemiplegia and hemiparesis following unspecified cerebrovascular disease affecting unspecified side: Secondary | ICD-10-CM | POA: Diagnosis not present

## 2013-11-09 DIAGNOSIS — R262 Difficulty in walking, not elsewhere classified: Secondary | ICD-10-CM | POA: Diagnosis not present

## 2013-11-10 DIAGNOSIS — R262 Difficulty in walking, not elsewhere classified: Secondary | ICD-10-CM | POA: Diagnosis not present

## 2013-11-10 DIAGNOSIS — I69959 Hemiplegia and hemiparesis following unspecified cerebrovascular disease affecting unspecified side: Secondary | ICD-10-CM | POA: Diagnosis not present

## 2013-11-10 DIAGNOSIS — M6281 Muscle weakness (generalized): Secondary | ICD-10-CM | POA: Diagnosis not present

## 2013-11-10 DIAGNOSIS — R279 Unspecified lack of coordination: Secondary | ICD-10-CM | POA: Diagnosis not present

## 2013-11-11 DIAGNOSIS — R279 Unspecified lack of coordination: Secondary | ICD-10-CM | POA: Diagnosis not present

## 2013-11-11 DIAGNOSIS — M6281 Muscle weakness (generalized): Secondary | ICD-10-CM | POA: Diagnosis not present

## 2013-11-11 DIAGNOSIS — R262 Difficulty in walking, not elsewhere classified: Secondary | ICD-10-CM | POA: Diagnosis not present

## 2013-11-11 DIAGNOSIS — I69959 Hemiplegia and hemiparesis following unspecified cerebrovascular disease affecting unspecified side: Secondary | ICD-10-CM | POA: Diagnosis not present

## 2013-11-12 DIAGNOSIS — R262 Difficulty in walking, not elsewhere classified: Secondary | ICD-10-CM | POA: Diagnosis not present

## 2013-11-12 DIAGNOSIS — M6281 Muscle weakness (generalized): Secondary | ICD-10-CM | POA: Diagnosis not present

## 2013-11-12 DIAGNOSIS — I69959 Hemiplegia and hemiparesis following unspecified cerebrovascular disease affecting unspecified side: Secondary | ICD-10-CM | POA: Diagnosis not present

## 2013-11-12 DIAGNOSIS — R279 Unspecified lack of coordination: Secondary | ICD-10-CM | POA: Diagnosis not present

## 2013-11-14 ENCOUNTER — Other Ambulatory Visit: Payer: Self-pay | Admitting: *Deleted

## 2013-11-14 DIAGNOSIS — M6281 Muscle weakness (generalized): Secondary | ICD-10-CM | POA: Diagnosis not present

## 2013-11-14 DIAGNOSIS — M25569 Pain in unspecified knee: Secondary | ICD-10-CM | POA: Diagnosis not present

## 2013-11-14 DIAGNOSIS — R279 Unspecified lack of coordination: Secondary | ICD-10-CM | POA: Diagnosis not present

## 2013-11-14 DIAGNOSIS — R262 Difficulty in walking, not elsewhere classified: Secondary | ICD-10-CM | POA: Diagnosis not present

## 2013-11-14 DIAGNOSIS — I69959 Hemiplegia and hemiparesis following unspecified cerebrovascular disease affecting unspecified side: Secondary | ICD-10-CM | POA: Diagnosis not present

## 2013-11-14 MED ORDER — LORAZEPAM 0.5 MG PO TABS: ORAL_TABLET | ORAL | Status: DC

## 2013-11-14 NOTE — Telephone Encounter (Signed)
[facility] 

## 2013-11-15 DIAGNOSIS — R279 Unspecified lack of coordination: Secondary | ICD-10-CM | POA: Diagnosis not present

## 2013-11-15 DIAGNOSIS — M6281 Muscle weakness (generalized): Secondary | ICD-10-CM | POA: Diagnosis not present

## 2013-11-15 DIAGNOSIS — I69959 Hemiplegia and hemiparesis following unspecified cerebrovascular disease affecting unspecified side: Secondary | ICD-10-CM | POA: Diagnosis not present

## 2013-11-15 DIAGNOSIS — R262 Difficulty in walking, not elsewhere classified: Secondary | ICD-10-CM | POA: Diagnosis not present

## 2013-11-16 DIAGNOSIS — R279 Unspecified lack of coordination: Secondary | ICD-10-CM | POA: Diagnosis not present

## 2013-11-16 DIAGNOSIS — M6281 Muscle weakness (generalized): Secondary | ICD-10-CM | POA: Diagnosis not present

## 2013-11-16 DIAGNOSIS — I69959 Hemiplegia and hemiparesis following unspecified cerebrovascular disease affecting unspecified side: Secondary | ICD-10-CM | POA: Diagnosis not present

## 2013-11-16 DIAGNOSIS — R262 Difficulty in walking, not elsewhere classified: Secondary | ICD-10-CM | POA: Diagnosis not present

## 2013-11-17 DIAGNOSIS — R262 Difficulty in walking, not elsewhere classified: Secondary | ICD-10-CM | POA: Diagnosis not present

## 2013-11-17 DIAGNOSIS — M6281 Muscle weakness (generalized): Secondary | ICD-10-CM | POA: Diagnosis not present

## 2013-11-17 DIAGNOSIS — R279 Unspecified lack of coordination: Secondary | ICD-10-CM | POA: Diagnosis not present

## 2013-11-17 DIAGNOSIS — I69959 Hemiplegia and hemiparesis following unspecified cerebrovascular disease affecting unspecified side: Secondary | ICD-10-CM | POA: Diagnosis not present

## 2013-11-18 DIAGNOSIS — R279 Unspecified lack of coordination: Secondary | ICD-10-CM | POA: Diagnosis not present

## 2013-11-18 DIAGNOSIS — I69959 Hemiplegia and hemiparesis following unspecified cerebrovascular disease affecting unspecified side: Secondary | ICD-10-CM | POA: Diagnosis not present

## 2013-11-18 DIAGNOSIS — R262 Difficulty in walking, not elsewhere classified: Secondary | ICD-10-CM | POA: Diagnosis not present

## 2013-11-18 DIAGNOSIS — M6281 Muscle weakness (generalized): Secondary | ICD-10-CM | POA: Diagnosis not present

## 2013-11-19 DIAGNOSIS — R279 Unspecified lack of coordination: Secondary | ICD-10-CM | POA: Diagnosis not present

## 2013-11-19 DIAGNOSIS — R262 Difficulty in walking, not elsewhere classified: Secondary | ICD-10-CM | POA: Diagnosis not present

## 2013-11-19 DIAGNOSIS — I69959 Hemiplegia and hemiparesis following unspecified cerebrovascular disease affecting unspecified side: Secondary | ICD-10-CM | POA: Diagnosis not present

## 2013-11-19 DIAGNOSIS — M6281 Muscle weakness (generalized): Secondary | ICD-10-CM | POA: Diagnosis not present

## 2013-11-21 DIAGNOSIS — I69959 Hemiplegia and hemiparesis following unspecified cerebrovascular disease affecting unspecified side: Secondary | ICD-10-CM | POA: Diagnosis not present

## 2013-11-21 DIAGNOSIS — M6281 Muscle weakness (generalized): Secondary | ICD-10-CM | POA: Diagnosis not present

## 2013-11-21 DIAGNOSIS — R262 Difficulty in walking, not elsewhere classified: Secondary | ICD-10-CM | POA: Diagnosis not present

## 2013-11-21 DIAGNOSIS — R279 Unspecified lack of coordination: Secondary | ICD-10-CM | POA: Diagnosis not present

## 2013-11-22 DIAGNOSIS — M6281 Muscle weakness (generalized): Secondary | ICD-10-CM | POA: Diagnosis not present

## 2013-11-22 DIAGNOSIS — I69959 Hemiplegia and hemiparesis following unspecified cerebrovascular disease affecting unspecified side: Secondary | ICD-10-CM | POA: Diagnosis not present

## 2013-11-22 DIAGNOSIS — R262 Difficulty in walking, not elsewhere classified: Secondary | ICD-10-CM | POA: Diagnosis not present

## 2013-11-22 DIAGNOSIS — R279 Unspecified lack of coordination: Secondary | ICD-10-CM | POA: Diagnosis not present

## 2013-11-23 DIAGNOSIS — M6281 Muscle weakness (generalized): Secondary | ICD-10-CM | POA: Diagnosis not present

## 2013-11-23 DIAGNOSIS — R262 Difficulty in walking, not elsewhere classified: Secondary | ICD-10-CM | POA: Diagnosis not present

## 2013-11-23 DIAGNOSIS — I69959 Hemiplegia and hemiparesis following unspecified cerebrovascular disease affecting unspecified side: Secondary | ICD-10-CM | POA: Diagnosis not present

## 2013-11-23 DIAGNOSIS — R279 Unspecified lack of coordination: Secondary | ICD-10-CM | POA: Diagnosis not present

## 2013-11-24 ENCOUNTER — Non-Acute Institutional Stay (SKILLED_NURSING_FACILITY): Payer: Medicare Other | Admitting: Internal Medicine

## 2013-11-24 ENCOUNTER — Encounter: Payer: Self-pay | Admitting: Internal Medicine

## 2013-11-24 DIAGNOSIS — M6281 Muscle weakness (generalized): Secondary | ICD-10-CM | POA: Diagnosis not present

## 2013-11-24 DIAGNOSIS — R279 Unspecified lack of coordination: Secondary | ICD-10-CM | POA: Diagnosis not present

## 2013-11-24 DIAGNOSIS — F3289 Other specified depressive episodes: Secondary | ICD-10-CM

## 2013-11-24 DIAGNOSIS — F329 Major depressive disorder, single episode, unspecified: Secondary | ICD-10-CM | POA: Diagnosis not present

## 2013-11-24 DIAGNOSIS — I69959 Hemiplegia and hemiparesis following unspecified cerebrovascular disease affecting unspecified side: Secondary | ICD-10-CM | POA: Diagnosis not present

## 2013-11-24 DIAGNOSIS — F32A Depression, unspecified: Secondary | ICD-10-CM

## 2013-11-24 DIAGNOSIS — R262 Difficulty in walking, not elsewhere classified: Secondary | ICD-10-CM | POA: Diagnosis not present

## 2013-11-24 NOTE — Assessment & Plan Note (Signed)
Pt herself confesses she is depressed after the nursing reported to me today that she cried for an hour. We spoke for 45 min, rather [name] spoke. Bottom line- she has read, watched TV and done everything in the facility she can do that she likes.. She misses her grandson, she misses going to shops, and the bookstore when she needs something to do. She misses driving. Pt has situational depression, but her situation isn't going to change. She asked I increase her anti-depressant. She is on max beneficial dose of Cymbalta 60 mg so consulted psych for help.I spoke to SNF admin re: new activities and told [name] to start thinking too.

## 2013-11-24 NOTE — Progress Notes (Signed)
MRN: [id number] Name: Chelsey Gallagher  Sex: female Age: 63 y.o. DOB: Aug 31, 1950  [place] #: [name] Facility/Room: 104A Level Of Care: SNF Provider: Inocencio Homes D Emergency Contacts: Extended Emergency Contact Information Primary Emergency Contact: [name] of [place] Phone: [phone] Mobile Phone: [phone] Relation: Daughter  Code StatusDNR:   Allergies: Codeine; Flexeril; and Lyrica  Chief Complaint  Patient presents with  . Medical Management of Chronic Issues    HPI: Patient is 63 y.o. female who has developed a major depression.  Past Medical History  Diagnosis Date  . Diabetes mellitus   . Hypertension   . PAD (peripheral artery disease)     S/p bypass grafting, unclear exactly where  . Nephrolithiasis   . Hyperlipidemia 05/14/2011  . Hearing loss   . Complication of anesthesia     doesn't wake up good- "usually end up in ICU" also experiences post op nausea and vomiting  . Orthostatic hypotension     after surgery.  Marland Kitchen Heart murmur     PCP- [facility]- No tx needed.  . Stroke 05/2011    Weakness on Right. Wears leg brace.  . Foot drop, left   . H/O hiatal hernia     second one  . Diabetic neuropathy     Diabetic neuropathy- has inproved since she quit smoking.  . Arthritis   . Depression     Past Surgical History  Procedure Laterality Date  . Total hip arthroplasty  2008    Right  . Abdominal hysterectomy  2003  . Cesarean section  1984  . Cholecystectomy  1984  . Bypass graft  2003    Abdominal aortic  . Kidney stone surgery  1999  . Appendectomy  2011  . Hernia repair  2011  . Lithotripsy    . Abdominal hysterectomy    . Esophagogastroduodenoscopy N/A 05/09/2013    Procedure: ESOPHAGOGASTRODUODENOSCOPY (EGD);  Surgeon: Lafayette Dragon, MD;  Location: [facility] ENDOSCOPY;  Service: Endoscopy;  Laterality: N/A;  . Colonoscopy N/A 05/10/2013    Procedure: COLONOSCOPY;  Surgeon: Lafayette Dragon, MD;  Location: [facility]  ENDOSCOPY;  Service: Endoscopy;  Laterality: N/A;      Medication List       This list is accurate as of: 11/24/13  7:46 PM.  Always use your most recent med list.               acetaminophen 325 MG tablet  Commonly known as:  TYLENOL  Take 650 mg by mouth every 6 (six) hours as needed for pain.     aspirin EC 81 MG tablet  Take 81 mg by mouth daily.     baclofen 20 MG tablet  Commonly known as:  LIORESAL  Take 20 mg by mouth at bedtime.     baclofen 10 MG tablet  Commonly known as:  LIORESAL  Take 5 mg by mouth 2 (two) times daily. 5 mg at 8a and 4pm     BENEFIBER Powd  Take 1 Applicatorful by mouth daily. Mix with 8oz of fluid     cholecalciferol 1000 UNITS tablet  Commonly known as:  VITAMIN D  Take 1,000 Units by mouth daily.     clopidogrel 75 MG tablet  Commonly known as:  PLAVIX  Take 75 mg by mouth daily.     DULoxetine 30 MG capsule  Commonly known as:  CYMBALTA  Take 60 mg by mouth daily.     ferrous sulfate 325 (65 FE) MG  tablet  Take 325 mg by mouth 3 (three) times daily.     gabapentin 300 MG capsule  Commonly known as:  NEURONTIN  Take 300 mg by mouth at bedtime.     insulin glargine 100 UNIT/ML injection  Commonly known as:  LANTUS  Inject 12 Units into the skin at bedtime.     LORazepam 0.5 MG tablet  Commonly known as:  ATIVAN  Take 1/2 tablet by mouth every morning at 9am; Take one tablet by mouth every evening at 9pm     lubiprostone 24 MCG capsule  Commonly known as:  AMITIZA  Take 24 mcg by mouth 2 (two) times daily with a meal.     magnesium hydroxide 800 MG/5ML suspension  Commonly known as:  MILK OF MAGNESIA  Take 30 mLs by mouth daily as needed for constipation. 30cc     Menthol (Topical Analgesic) 4 % Gel  Commonly known as:  BIOFREEZE  To right ankle and bilateral knees TID     menthol-cetylpyridinium 3 MG lozenge  Commonly known as:  CEPACOL  Take 1 lozenge by mouth every 4 (four) hours as needed for sore throat.      metFORMIN 500 MG tablet  Commonly known as:  GLUCOPHAGE  Take 1,000 mg by mouth 2 (two) times daily with a meal.     methocarbamol 500 MG tablet  Commonly known as:  ROBAXIN  Take 1 tablet (500 mg total) by mouth at bedtime.     midodrine 5 MG tablet  Commonly known as:  PROAMATINE  Take 5 mg by mouth 3 (three) times daily.     mirabegron ER 25 MG Tb24 tablet  Commonly known as:  MYRBETRIQ  Take 1 tablet (25 mg total) by mouth daily.     polyethylene glycol packet  Commonly known as:  MIRALAX / GLYCOLAX  Take 17 g by mouth daily as needed for mild constipation.     pravastatin 40 MG tablet  Commonly known as:  PRAVACHOL  Take 40 mg by mouth daily.     traMADol 100 MG 24 hr tablet  Commonly known as:  ULTRAM-ER  Take 2 tablets by mouth every 8 hours as needed.     traMADol 50 MG tablet  Commonly known as:  ULTRAM  Take 2 tablets (100 mg total) by mouth every 8 (eight) hours as needed.        No orders of the defined types were placed in this encounter.    Immunization History  Administered Date(s) Administered  . Influenza-Unspecified 12/04/2011  . Pneumococcal Polysaccharide-23 09/03/2011  . Pneumococcal-Unspecified 12/02/2010    History  Substance Use Topics  . Smoking status: Former Smoker -- 0.00 packs/day for 45 years    Types: Cigarettes  . Smokeless tobacco: Former Systems developer    Quit date: 05/08/2011  . Alcohol Use: No    Review of Systems  DATA OBTAINED: from patient, nurse GENERAL:  no fevers, fatigue, appetite changes SKIN: No itching, rash HEENT: No complaint RESPIRATORY: No cough, wheezing, SOB CARDIAC: No chest pain, palpitations, lower extremity edema  GI: No abdominal pain, No N/V/D or constipation, No heartburn or reflux  GU: No dysuria, frequency or urgency, or incontinence  MUSCULOSKELETAL: No unrelieved bone/joint pain NEUROLOGIC: No headache, dizziness  PSYCHIATRIC: does not need anxiety meds, needs meds for depression   Filed Vitals:    11/24/13 1921  BP: 141/66  Pulse: 87  Temp: 98.2 F (36.8 C)  Resp: 20    Physical Exam  GENERAL APPEARANCE: Alert, conversant. Appropriately groomed  SKIN: No diaphoresis rash HEENT: Unremarkable RESPIRATORY: Breathing is even, unlabored. Lung sounds are clear   CARDIOVASCULAR: Heart RRR no murmurs, rubs or gallops. No peripheral edema  GASTROINTESTINAL: Abdomen is soft, non-tender, not distended w/ normal bowel sounds.  GENITOURINARY: Bladder non tender, not distended  MUSCULOSKELETAL: No abnormal joints or musculature NEUROLOGIC: Cranial nerves 2-12 grossly intact.R side weakness PSYCHIATRIC: occ tearful  Patient Active Problem List   Diagnosis Date Noted  . Generalized anxiety disorder 08/26/2013  . Late effects of CVA (cerebrovascular accident) 07/08/2013  . Depression   . Hypotension 05/08/2013  . Fecal occult blood test positive 05/08/2013  . Altered mental status 05/08/2013  . Insomnia 04/26/2013  . UTI (urinary tract infection) 07/02/2012  . Anemia, iron deficiency 07/02/2012  . Ingrown toenail 07/02/2012  . Unspecified constipation 07/02/2012  . 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 Unspecified cerebral artery occlusion with cerebral infarction 04/13/2012  . Orthostatic hypotension 04/06/2012  . Abnormal TSH 04/06/2012  . Spastic hemiplegia affecting dominant side 07/25/2011  . Stroke 05/15/2011  . Hyperlipidemia 05/14/2011  . Tobacco abuse 05/14/2011  . 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 Lung abnormality 05/11/2011  . CVA (cerebral infarction) 05/08/2011  . Hypertension   . Type II or unspecified type diabetes mellitus with neurological manifestations, uncontrolled(250.62)     CBC    Component Value Date/Time   WBC 6.0 05/09/2013 0815   RBC 3.68* 05/09/2013 0815   RBC 2.99* 04/05/2012 0345   HGB 9.6* 05/09/2013 0815   HCT 31.7* 05/09/2013 0815   PLT 247 05/09/2013 0815   MCV 86.1 05/09/2013 0815   LYMPHSABS  2.0 05/07/2013 2223   MONOABS 0.6 05/07/2013 2223   EOSABS 0.1 05/07/2013 2223   BASOSABS 0.0 05/07/2013 2223    CMP     Component Value Date/Time   NA 140 05/09/2013 0815   K 4.3 05/09/2013 0815   CL 104 05/09/2013 0815   CO2 23 05/09/2013 0815   GLUCOSE 136* 05/09/2013 0815   BUN 11 05/09/2013 0815   CREATININE 0.63 05/09/2013 0815   CALCIUM 10.0 05/09/2013 0815   PROT 6.6 05/07/2013 2223   ALBUMIN 3.3* 05/07/2013 2223   AST 32 05/07/2013 2223   ALT 20 05/07/2013 2223   ALKPHOS 58 05/07/2013 2223   BILITOT 0.2* 05/07/2013 2223   GFRNONAA >90 05/09/2013 0815   GFRAA >90 05/09/2013 0815    Assessment and Plan  Depression Pt herself confesses she is depressed after the nursing reported to me today that she cried for an hour. We spoke for 45 min, rather Yanisa spoke. Bottom line- she has read, watched TV and done everything in the facility she can do that she likes.. She misses her grandson, she misses going to shops, and the bookstore when she needs something to do. She misses driving. Pt has situational depression, but her situation isn't going to change. She asked I increase her anti-depressant. She is on max beneficial dose of Cymbalta 60 mg so consulted psych for help.I spoke to SNF admin re: new activities and told Onita to start thinking too.   Time spent with pt - 45 min. Hennie Duos, MD

## 2013-11-25 DIAGNOSIS — M6281 Muscle weakness (generalized): Secondary | ICD-10-CM | POA: Diagnosis not present

## 2013-11-25 DIAGNOSIS — I69959 Hemiplegia and hemiparesis following unspecified cerebrovascular disease affecting unspecified side: Secondary | ICD-10-CM | POA: Diagnosis not present

## 2013-11-25 DIAGNOSIS — R279 Unspecified lack of coordination: Secondary | ICD-10-CM | POA: Diagnosis not present

## 2013-11-25 DIAGNOSIS — R262 Difficulty in walking, not elsewhere classified: Secondary | ICD-10-CM | POA: Diagnosis not present

## 2013-11-28 DIAGNOSIS — M6281 Muscle weakness (generalized): Secondary | ICD-10-CM | POA: Diagnosis not present

## 2013-11-28 DIAGNOSIS — R262 Difficulty in walking, not elsewhere classified: Secondary | ICD-10-CM | POA: Diagnosis not present

## 2013-11-28 DIAGNOSIS — R279 Unspecified lack of coordination: Secondary | ICD-10-CM | POA: Diagnosis not present

## 2013-11-28 DIAGNOSIS — I69959 Hemiplegia and hemiparesis following unspecified cerebrovascular disease affecting unspecified side: Secondary | ICD-10-CM | POA: Diagnosis not present

## 2013-11-30 DIAGNOSIS — R262 Difficulty in walking, not elsewhere classified: Secondary | ICD-10-CM | POA: Diagnosis not present

## 2013-11-30 DIAGNOSIS — R279 Unspecified lack of coordination: Secondary | ICD-10-CM | POA: Diagnosis not present

## 2013-11-30 DIAGNOSIS — M6281 Muscle weakness (generalized): Secondary | ICD-10-CM | POA: Diagnosis not present

## 2013-11-30 DIAGNOSIS — I69959 Hemiplegia and hemiparesis following unspecified cerebrovascular disease affecting unspecified side: Secondary | ICD-10-CM | POA: Diagnosis not present

## 2013-12-01 DIAGNOSIS — M25579 Pain in unspecified ankle and joints of unspecified foot: Secondary | ICD-10-CM | POA: Diagnosis not present

## 2013-12-01 DIAGNOSIS — R262 Difficulty in walking, not elsewhere classified: Secondary | ICD-10-CM | POA: Diagnosis not present

## 2013-12-01 DIAGNOSIS — E1140 Type 2 diabetes mellitus with diabetic neuropathy, unspecified: Secondary | ICD-10-CM | POA: Diagnosis not present

## 2013-12-02 DIAGNOSIS — E1140 Type 2 diabetes mellitus with diabetic neuropathy, unspecified: Secondary | ICD-10-CM | POA: Diagnosis not present

## 2013-12-02 DIAGNOSIS — M25579 Pain in unspecified ankle and joints of unspecified foot: Secondary | ICD-10-CM | POA: Diagnosis not present

## 2013-12-02 DIAGNOSIS — Z23 Encounter for immunization: Secondary | ICD-10-CM | POA: Diagnosis not present

## 2013-12-02 DIAGNOSIS — R262 Difficulty in walking, not elsewhere classified: Secondary | ICD-10-CM | POA: Diagnosis not present

## 2013-12-03 DIAGNOSIS — R262 Difficulty in walking, not elsewhere classified: Secondary | ICD-10-CM | POA: Diagnosis not present

## 2013-12-03 DIAGNOSIS — M25579 Pain in unspecified ankle and joints of unspecified foot: Secondary | ICD-10-CM | POA: Diagnosis not present

## 2013-12-03 DIAGNOSIS — E1140 Type 2 diabetes mellitus with diabetic neuropathy, unspecified: Secondary | ICD-10-CM | POA: Diagnosis not present

## 2013-12-05 DIAGNOSIS — R262 Difficulty in walking, not elsewhere classified: Secondary | ICD-10-CM | POA: Diagnosis not present

## 2013-12-05 DIAGNOSIS — E1140 Type 2 diabetes mellitus with diabetic neuropathy, unspecified: Secondary | ICD-10-CM | POA: Diagnosis not present

## 2013-12-05 DIAGNOSIS — M25579 Pain in unspecified ankle and joints of unspecified foot: Secondary | ICD-10-CM | POA: Diagnosis not present

## 2013-12-06 DIAGNOSIS — E1140 Type 2 diabetes mellitus with diabetic neuropathy, unspecified: Secondary | ICD-10-CM | POA: Diagnosis not present

## 2013-12-06 DIAGNOSIS — R262 Difficulty in walking, not elsewhere classified: Secondary | ICD-10-CM | POA: Diagnosis not present

## 2013-12-06 DIAGNOSIS — M25579 Pain in unspecified ankle and joints of unspecified foot: Secondary | ICD-10-CM | POA: Diagnosis not present

## 2013-12-07 DIAGNOSIS — R262 Difficulty in walking, not elsewhere classified: Secondary | ICD-10-CM | POA: Diagnosis not present

## 2013-12-07 DIAGNOSIS — M25579 Pain in unspecified ankle and joints of unspecified foot: Secondary | ICD-10-CM | POA: Diagnosis not present

## 2013-12-07 DIAGNOSIS — E1140 Type 2 diabetes mellitus with diabetic neuropathy, unspecified: Secondary | ICD-10-CM | POA: Diagnosis not present

## 2013-12-08 DIAGNOSIS — M25579 Pain in unspecified ankle and joints of unspecified foot: Secondary | ICD-10-CM | POA: Diagnosis not present

## 2013-12-08 DIAGNOSIS — R262 Difficulty in walking, not elsewhere classified: Secondary | ICD-10-CM | POA: Diagnosis not present

## 2013-12-08 DIAGNOSIS — E1140 Type 2 diabetes mellitus with diabetic neuropathy, unspecified: Secondary | ICD-10-CM | POA: Diagnosis not present

## 2013-12-12 ENCOUNTER — Other Ambulatory Visit: Payer: Self-pay | Admitting: *Deleted

## 2013-12-12 MED ORDER — TRAMADOL HCL 50 MG PO TABS
ORAL_TABLET | ORAL | Status: DC
Start: 2013-12-12 — End: 2014-02-14

## 2013-12-12 NOTE — Telephone Encounter (Signed)
[facility] 

## 2013-12-13 DIAGNOSIS — F411 Generalized anxiety disorder: Secondary | ICD-10-CM | POA: Diagnosis not present

## 2013-12-13 DIAGNOSIS — F329 Major depressive disorder, single episode, unspecified: Secondary | ICD-10-CM | POA: Diagnosis not present

## 2013-12-16 DIAGNOSIS — M1812 Unilateral primary osteoarthritis of first carpometacarpal joint, left hand: Secondary | ICD-10-CM | POA: Diagnosis not present

## 2013-12-30 ENCOUNTER — Non-Acute Institutional Stay (SKILLED_NURSING_FACILITY): Payer: Medicare Other | Admitting: Nurse Practitioner

## 2013-12-30 DIAGNOSIS — D509 Iron deficiency anemia, unspecified: Secondary | ICD-10-CM

## 2013-12-30 DIAGNOSIS — F329 Major depressive disorder, single episode, unspecified: Secondary | ICD-10-CM

## 2013-12-30 DIAGNOSIS — F32A Depression, unspecified: Secondary | ICD-10-CM

## 2013-12-30 DIAGNOSIS — G8110 Spastic hemiplegia affecting unspecified side: Secondary | ICD-10-CM | POA: Diagnosis not present

## 2013-12-30 DIAGNOSIS — K5900 Constipation, unspecified: Secondary | ICD-10-CM | POA: Diagnosis not present

## 2013-12-30 DIAGNOSIS — I959 Hypotension, unspecified: Secondary | ICD-10-CM | POA: Diagnosis not present

## 2013-12-30 DIAGNOSIS — E1149 Type 2 diabetes mellitus with other diabetic neurological complication: Secondary | ICD-10-CM | POA: Diagnosis not present

## 2013-12-30 NOTE — Progress Notes (Signed)
Patient ID: Chelsey Gallagher, female   DOB: 09-Apr-1950, 63 y.o.   MRN: [id number]    Nursing Home Location:  [place] of Service: SNF (31)  PCP: Hennie Duos, MD  Allergies  Allergen Reactions  . Codeine Other (See Comments)    Per Mar  . Flexeril [Cyclobenzaprine Hcl] Other (See Comments)    Per Mar  . Lyrica [Pregabalin] Other (See Comments)    "wires my up"    Chief Complaint  Patient presents with  . Medical Management of Chronic Issues    HPI:  Patient is a 63 y.o. female seen today at [facility], seen today for routine follow up. She has a  pmh of CVA with spastic hemiplegia, constipation, anxiety, DM, HTN, and hyperlipidemia.  Pt has been in her usual state of health in the last month. No acute issues. conts having dysuria, has not followed up with urology.  conts to follow with ortho regarding pain.    Review of Systems:  Review of Systems  Constitutional: Negative for activity change, appetite change, fatigue and unexpected weight change.  HENT: Negative for congestion and hearing loss.   Eyes: Negative.   Respiratory: Negative for cough and shortness of breath.   Cardiovascular: Negative for chest pain, palpitations and leg swelling.  Gastrointestinal: Negative for abdominal pain, diarrhea and constipation.  Genitourinary: Positive for dysuria. Negative for difficulty urinating.  Musculoskeletal: Negative for arthralgias and myalgias.  Skin: Negative for color change and wound.  Neurological: Negative for dizziness and weakness.  Psychiatric/Behavioral: Negative for behavioral problems, confusion and agitation.       Depression, following with psych    Past Medical History  Diagnosis Date  . Diabetes mellitus   . Hypertension   . PAD (peripheral artery disease)     S/p bypass grafting, unclear exactly where  . Nephrolithiasis   . Hyperlipidemia 05/14/2011  . Hearing loss   . Complication of anesthesia    doesn't wake up good- "usually end up in ICU" also experiences post op nausea and vomiting  . Orthostatic hypotension     after surgery.  Marland Kitchen Heart murmur     PCP- [facility]- No tx needed.  . Stroke 05/2011    Weakness on Right. Wears leg brace.  . Foot drop, left   . H/O hiatal hernia     second one  . Diabetic neuropathy     Diabetic neuropathy- has inproved since she quit smoking.  . Arthritis   . Depression    Past Surgical History  Procedure Laterality Date  . Total hip arthroplasty  2008    Right  . Abdominal hysterectomy  2003  . Cesarean section  1984  . Cholecystectomy  1984  . Bypass graft  2003    Abdominal aortic  . Kidney stone surgery  1999  . Appendectomy  2011  . Hernia repair  2011  . Lithotripsy    . Abdominal hysterectomy    . Esophagogastroduodenoscopy N/A 05/09/2013    Procedure: ESOPHAGOGASTRODUODENOSCOPY (EGD);  Surgeon: Lafayette Dragon, MD;  Location: [facility] ENDOSCOPY;  Service: Endoscopy;  Laterality: N/A;  . Colonoscopy N/A 05/10/2013    Procedure: COLONOSCOPY;  Surgeon: Lafayette Dragon, MD;  Location: [facility] ENDOSCOPY;  Service: Endoscopy;  Laterality: N/A;   Social History:   reports that she has quit smoking. Her smoking use included Cigarettes. She smoked 0.00 packs per day for 45 years. She quit smokeless tobacco use about 2  years ago. She reports that she does not drink alcohol or use illicit drugs.  Family History  Problem Relation Age of Onset  . Cancer Maternal Aunt     breast    Medications: Patient's Medications  New Prescriptions   No medications on file  Previous Medications   ACETAMINOPHEN (TYLENOL) 325 MG TABLET    Take 650 mg by mouth every 6 (six) hours as needed for pain.   ASPIRIN EC 81 MG TABLET    Take 81 mg by mouth daily.   BACLOFEN (LIORESAL) 10 MG TABLET    Take 5 mg by mouth 2 (two) times daily. 5 mg at 8a and 4pm   BACLOFEN (LIORESAL) 20 MG TABLET    Take 20 mg by mouth at bedtime.   CHOLECALCIFEROL (VITAMIN D) 1000  UNITS TABLET    Take 1,000 Units by mouth daily.   CLOPIDOGREL (PLAVIX) 75 MG TABLET    Take 75 mg by mouth daily.   DULOXETINE (CYMBALTA) 30 MG CAPSULE    Take 60 mg by mouth daily.   FERROUS SULFATE 325 (65 FE) MG TABLET    Take 325 mg by mouth 3 (three) times daily.    GABAPENTIN (NEURONTIN) 300 MG CAPSULE    Take 300 mg by mouth at bedtime.   INSULIN GLARGINE (LANTUS) 100 UNIT/ML INJECTION    Inject 12 Units into the skin at bedtime.    LORAZEPAM (ATIVAN) 0.5 MG TABLET    Take 1/2 tablet by mouth every morning at 9am; Take one tablet by mouth every evening at 9pm   LUBIPROSTONE (AMITIZA) 24 MCG CAPSULE    Take 24 mcg by mouth 2 (two) times daily with a meal.   MAGNESIUM HYDROXIDE (MILK OF MAGNESIA) 800 MG/5ML SUSPENSION    Take 30 mLs by mouth daily as needed for constipation. 30cc   MENTHOL, TOPICAL ANALGESIC, (BIOFREEZE) 4 % GEL    To right ankle and bilateral knees TID   MENTHOL-CETYLPYRIDINIUM (CEPACOL) 3 MG LOZENGE    Take 1 lozenge by mouth every 4 (four) hours as needed for sore throat.   METFORMIN (GLUCOPHAGE) 500 MG TABLET    Take 1,000 mg by mouth 2 (two) times daily with a meal.    METHOCARBAMOL (ROBAXIN) 500 MG TABLET    Take 1 tablet (500 mg total) by mouth at bedtime.   MIDODRINE (PROAMATINE) 5 MG TABLET    Take 5 mg by mouth 3 (three) times daily.   MIRABEGRON ER (MYRBETRIQ) 25 MG TB24 TABLET    Take 1 tablet (25 mg total) by mouth daily.   POLYETHYLENE GLYCOL (MIRALAX / GLYCOLAX) PACKET    Take 17 g by mouth daily as needed for mild constipation.    PRAVASTATIN (PRAVACHOL) 40 MG TABLET    Take 40 mg by mouth daily.    TRAMADOL (ULTRAM) 50 MG TABLET    Take 2 tablets (100 mg total) by mouth every 8 (eight) hours as needed.   TRAMADOL (ULTRAM) 50 MG TABLET    Take two tablets by mouth every 8 hours as needed for pain   TRAMADOL (ULTRAM-ER) 100 MG 24 HR TABLET    Take 2 tablets by mouth every 8 hours as needed.   WHEAT DEXTRIN (BENEFIBER) POWD    Take 1 Applicatorful by mouth  daily. Mix with 8oz of fluid  Modified Medications   No medications on file  Discontinued Medications   No medications on file     Physical Exam: Filed Vitals:   12/30/13 1537  BP: 137/76  Pulse: 80  Temp: 97.7 F (36.5 C)  Resp: 20    Physical Exam  Constitutional: She is oriented to person, place, and time. She appears well-developed and well-nourished. No distress.  HENT:  Head: Normocephalic and atraumatic.  Mouth/Throat: Oropharynx is clear and moist.  Neck: Normal range of motion. Neck supple.  Cardiovascular: Normal rate and regular rhythm.   Pulmonary/Chest: Effort normal and breath sounds normal. No respiratory distress.  Abdominal: Soft. Bowel sounds are normal. She exhibits no distension. There is no tenderness.  Musculoskeletal: She exhibits no edema and no tenderness.  Right sided hemiparesis   Neurological: She is alert and oriented to person, place, and time.  Skin: Skin is warm and dry. She is not diaphoretic.  Psychiatric: She has a normal mood and affect.    Labs reviewed: Basic Metabolic Panel:  Recent Labs  05/07/13 2223 05/09/13 0815  NA 132* 140  K 4.6 4.3  CL 94* 104  CO2 23 23  GLUCOSE 167* 136*  BUN 20 11  CREATININE 0.82 0.63  CALCIUM 9.8 10.0   Liver Function Tests:  Recent Labs  05/07/13 2223  AST 32  ALT 20  ALKPHOS 58  BILITOT 0.2*  PROT 6.6  ALBUMIN 3.3*   No results found for this basename: LIPASE, AMYLASE,  in the last 8760 hours No results found for this basename: AMMONIA,  in the last 8760 hours CBC:  Recent Labs  05/07/13 2223 05/09/13 0815  WBC 8.1 6.0  NEUTROABS 5.4  --   HGB 8.6* 9.6*  HCT 27.9* 31.7*  MCV 86.6 86.1  PLT 253 247   TSH: No results found for this basename: TSH,  in the last 8760 hours A1C: Lab Results  Component Value Date   HGBA1C 5.3 04/05/2012   Lipid Panel: No results found for this basename: CHOL, HDL, LDLCALC, TRIG, CHOLHDL, LDLDIRECT,  in the last 8760  hours  Assessment/Plan 1. Type 2 diabetes mellitus with other diabetic neurological complication -cont cbgs weekly, will follow up A1c  2. Constipation, unspecified constipation type -controlled on current medications   3. Spastic hemiplegia affecting dominant side - conts on plavix, baclofen   4. Hypotension, unspecified hypotension type -stable  5. Depression  - unchanged, being managed by psych  6. Anemia -on iron, follow up cbc

## 2014-01-13 DIAGNOSIS — M1812 Unilateral primary osteoarthritis of first carpometacarpal joint, left hand: Secondary | ICD-10-CM | POA: Diagnosis not present

## 2014-01-24 ENCOUNTER — Non-Acute Institutional Stay (SKILLED_NURSING_FACILITY): Payer: Medicare Other | Admitting: Nurse Practitioner

## 2014-01-24 DIAGNOSIS — I6990 Unspecified sequelae of unspecified cerebrovascular disease: Secondary | ICD-10-CM

## 2014-01-24 DIAGNOSIS — F329 Major depressive disorder, single episode, unspecified: Secondary | ICD-10-CM

## 2014-01-24 DIAGNOSIS — E1149 Type 2 diabetes mellitus with other diabetic neurological complication: Secondary | ICD-10-CM | POA: Diagnosis not present

## 2014-01-24 DIAGNOSIS — K5900 Constipation, unspecified: Secondary | ICD-10-CM

## 2014-01-24 DIAGNOSIS — N3000 Acute cystitis without hematuria: Secondary | ICD-10-CM

## 2014-01-24 DIAGNOSIS — I951 Orthostatic hypotension: Secondary | ICD-10-CM

## 2014-01-24 DIAGNOSIS — F32A Depression, unspecified: Secondary | ICD-10-CM

## 2014-01-24 DIAGNOSIS — D509 Iron deficiency anemia, unspecified: Secondary | ICD-10-CM

## 2014-01-24 NOTE — Progress Notes (Signed)
Patient ID: Chelsey Gallagher, female   DOB: Jul 12, 1950, 63 y.o.   MRN: [id number]    Nursing Home Location:  [place] of Service: SNF (31)  PCP: Hennie Duos, MD  Allergies  Allergen Reactions  . Codeine Other (See Comments)    Per Mar  . Flexeril [Cyclobenzaprine Hcl] Other (See Comments)    Per Mar  . Lyrica [Pregabalin] Other (See Comments)    "wires my up"    Chief Complaint  Patient presents with  . Medical Management of Chronic Issues    HPI:  Patient is a 63 y.o. female seen today at [place], seen today for routine follow up. She has a  pmh of CVA with spastic hemiplegia, constipation, anxiety, DM, HTN, and hyperlipidemia. Pt with frequent tearful episodes during the exam stating she does feel depression due to situation and living at [facility]. Holidays are worse. No SI or HI. Complains of dysuria but does not want to do anything about it until she sees urology next week.   Review of Systems:  Review of Systems  Constitutional: Negative for activity change, appetite change, fatigue and unexpected weight change.  HENT: Negative for congestion and hearing loss.   Eyes: Negative.   Respiratory: Negative for cough and shortness of breath.   Cardiovascular: Negative for chest pain, palpitations and leg swelling.  Gastrointestinal: Negative for abdominal pain, diarrhea and constipation.  Genitourinary: Positive for dysuria. Negative for difficulty urinating.  Musculoskeletal: Positive for myalgias (on right side). Negative for arthralgias.  Skin: Negative for color change and wound.  Neurological: Negative for dizziness and weakness.  Psychiatric/Behavioral: Negative for behavioral problems, confusion and agitation.       Depression, following with psych    Past Medical History  Diagnosis Date  . Diabetes mellitus   . Hypertension   . PAD (peripheral artery disease)     S/p bypass grafting, unclear exactly where  .  Nephrolithiasis   . Hyperlipidemia 05/14/2011  . Hearing loss   . Complication of anesthesia     doesn't wake up good- "usually end up in ICU" also experiences post op nausea and vomiting  . Orthostatic hypotension     after surgery.  Marland Kitchen Heart murmur     PCP- [facility]- No tx needed.  . Stroke 05/2011    Weakness on Right. Wears leg brace.  . Foot drop, left   . H/O hiatal hernia     second one  . Diabetic neuropathy     Diabetic neuropathy- has inproved since she quit smoking.  . Arthritis   . Depression    Past Surgical History  Procedure Laterality Date  . Total hip arthroplasty  2008    Right  . Abdominal hysterectomy  2003  . Cesarean section  1984  . Cholecystectomy  1984  . Bypass graft  2003    Abdominal aortic  . Kidney stone surgery  1999  . Appendectomy  2011  . Hernia repair  2011  . Lithotripsy    . Abdominal hysterectomy    . Esophagogastroduodenoscopy N/A 05/09/2013    Procedure: ESOPHAGOGASTRODUODENOSCOPY (EGD);  Surgeon: Lafayette Dragon, MD;  Location: [facility] ENDOSCOPY;  Service: Endoscopy;  Laterality: N/A;  . Colonoscopy N/A 05/10/2013    Procedure: COLONOSCOPY;  Surgeon: Lafayette Dragon, MD;  Location: [facility] ENDOSCOPY;  Service: Endoscopy;  Laterality: N/A;   Social History:   reports that she has quit smoking. Her smoking use included Cigarettes.  She smoked 0.00 packs per day for 45 years. She quit smokeless tobacco use about 2 years ago. She reports that she does not drink alcohol or use illicit drugs.  Family History  Problem Relation Age of Onset  . Cancer Maternal Aunt     breast    Medications: Patient's Medications  New Prescriptions   No medications on file  Previous Medications   ACETAMINOPHEN (TYLENOL) 325 MG TABLET    Take 650 mg by mouth every 6 (six) hours as needed for pain.   ASPIRIN EC 81 MG TABLET    Take 81 mg by mouth daily.   BACLOFEN (LIORESAL) 10 MG TABLET    Take 5 mg by mouth 2 (two) times daily. 5 mg at 8a and 4pm    BACLOFEN (LIORESAL) 20 MG TABLET    Take 20 mg by mouth at bedtime.   CHOLECALCIFEROL (VITAMIN D) 1000 UNITS TABLET    Take 1,000 Units by mouth daily.   CLOPIDOGREL (PLAVIX) 75 MG TABLET    Take 75 mg by mouth daily.   DULOXETINE (CYMBALTA) 30 MG CAPSULE    Take 60 mg by mouth daily.   FERROUS SULFATE 325 (65 FE) MG TABLET    Take 325 mg by mouth 3 (three) times daily.    GABAPENTIN (NEURONTIN) 300 MG CAPSULE    Take 300 mg by mouth at bedtime.   INSULIN GLARGINE (LANTUS) 100 UNIT/ML INJECTION    Inject 12 Units into the skin at bedtime.    LORAZEPAM (ATIVAN) 0.5 MG TABLET    Take 1/2 tablet by mouth every morning at 9am; Take one tablet by mouth every evening at 9pm   LUBIPROSTONE (AMITIZA) 24 MCG CAPSULE    Take 24 mcg by mouth 2 (two) times daily with a meal.   MAGNESIUM HYDROXIDE (MILK OF MAGNESIA) 800 MG/5ML SUSPENSION    Take 30 mLs by mouth daily as needed for constipation. 30cc   MENTHOL, TOPICAL ANALGESIC, (BIOFREEZE) 4 % GEL    To right ankle and bilateral knees TID   MENTHOL-CETYLPYRIDINIUM (CEPACOL) 3 MG LOZENGE    Take 1 lozenge by mouth every 4 (four) hours as needed for sore throat.   METFORMIN (GLUCOPHAGE) 500 MG TABLET    Take 1,000 mg by mouth 2 (two) times daily with a meal.    METHOCARBAMOL (ROBAXIN) 500 MG TABLET    Take 1 tablet (500 mg total) by mouth at bedtime.   MIDODRINE (PROAMATINE) 5 MG TABLET    Take 5 mg by mouth 3 (three) times daily.   MIRABEGRON ER (MYRBETRIQ) 25 MG TB24 TABLET    Take 1 tablet (25 mg total) by mouth daily.   POLYETHYLENE GLYCOL (MIRALAX / GLYCOLAX) PACKET    Take 17 g by mouth daily as needed for mild constipation.    PRAVASTATIN (PRAVACHOL) 40 MG TABLET    Take 40 mg by mouth daily.    TRAMADOL (ULTRAM) 50 MG TABLET    Take 2 tablets (100 mg total) by mouth every 8 (eight) hours as needed.   TRAMADOL (ULTRAM) 50 MG TABLET    Take two tablets by mouth every 8 hours as needed for pain   TRAMADOL (ULTRAM-ER) 100 MG 24 HR TABLET    Take 2 tablets  by mouth every 8 hours as needed.   WHEAT DEXTRIN (BENEFIBER) POWD    Take 1 Applicatorful by mouth daily. Mix with 8oz of fluid  Modified Medications   No medications on file  Discontinued Medications  No medications on file     Physical Exam: Filed Vitals:   01/24/14 1621  BP: 138/75  Pulse: 80  Temp: 97.3 F (36.3 C)  Resp: 20  Weight: 150 lb (68.04 kg)    Physical Exam  Constitutional: She is oriented to person, place, and time. She appears well-developed and well-nourished. No distress.  HENT:  Head: Normocephalic and atraumatic.  Mouth/Throat: Oropharynx is clear and moist.  Neck: Normal range of motion. Neck supple.  Cardiovascular: Normal rate and regular rhythm.   Pulmonary/Chest: Effort normal and breath sounds normal. No respiratory distress.  Abdominal: Soft. Bowel sounds are normal. She exhibits no distension. There is no tenderness.  Musculoskeletal: She exhibits no edema or tenderness.  Right sided hemiparesis   Neurological: She is alert and oriented to person, place, and time.  Skin: Skin is warm and dry. She is not diaphoretic.  Psychiatric: She has a normal mood and affect.    Labs reviewed: Basic Metabolic Panel:  Recent Labs  05/07/13 2223 05/09/13 0815  NA 132* 140  K 4.6 4.3  CL 94* 104  CO2 23 23  GLUCOSE 167* 136*  BUN 20 11  CREATININE 0.82 0.63  CALCIUM 9.8 10.0   Liver Function Tests:  Recent Labs  05/07/13 2223  AST 32  ALT 20  ALKPHOS 58  BILITOT 0.2*  PROT 6.6  ALBUMIN 3.3*   No results for input(s): LIPASE, AMYLASE in the last 8760 hours. No results for input(s): AMMONIA in the last 8760 hours. CBC:  Recent Labs  05/07/13 2223 05/09/13 0815  WBC 8.1 6.0  NEUTROABS 5.4  --   HGB 8.6* 9.6*  HCT 27.9* 31.7*  MCV 86.6 86.1  PLT 253 247   TSH: No results for input(s): TSH in the last 8760 hours. A1C: Lab Results  Component Value Date   HGBA1C 5.3 04/05/2012   Lipid Panel: No results for input(s): CHOL,  HDL, LDLCALC, TRIG, CHOLHDL, LDLDIRECT in the last 8760 hours. CBC with Diff    Result: 01/02/2014 3:28 PM   ( Status: F )     C WBC 6.2     4.0-10.5 K/uL SLN   RBC 4.08     3.87-5.11 MIL/uL SLN   Hemoglobin 12.1     12.0-15.0 g/dL SLN   Hematocrit 36.3     36.0-46.0 % SLN   MCV 89.0     78.0-100.0 fL SLN   MCH 29.7     26.0-34.0 pg SLN   MCHC 33.3     30.0-36.0 g/dL SLN   RDW 14.6     11.5-15.5 % SLN   Platelet Count 190     150-400 K/uL SLN   Granulocyte % 66     43-77 % SLN   Absolute Gran 4.1     1.7-7.7 K/uL SLN   Lymph % 24     12-46 % SLN   Absolute Lymph 1.5     0.7-4.0 K/uL SLN   Mono % 8     3-12 % SLN   Absolute Mono 0.5     0.1-1.0 K/uL SLN   Eos % 2     0-5 % SLN   Absolute Eos 0.1     0.0-0.7 K/uL SLN   Baso % 0     0-1 % SLN   Absolute Baso 0.0     0.0-0.1 K/uL SLN   Smear Review Criteria for review not met  SLN   Comprehensive Metabolic Panel    Result: 01/02/2014  4:53 PM   ( Status: F )       Sodium 133   L 135-145 mEq/L SLN   Potassium 4.4     3.5-5.3 mEq/L SLN   Chloride 101     96-112 mEq/L SLN   CO2 24     19-32 mEq/L SLN   Glucose 226   H 70-99 mg/dL SLN   BUN 21     6-23 mg/dL SLN   Creatinine 0.79     0.50-1.10 mg/dL SLN   Bilirubin, Total 0.4     0.2-1.2 mg/dL SLN   Alkaline Phosphatase 68     39-117 U/L SLN   AST/SGOT 16     0-37 U/L SLN   ALT/SGPT 22     0-35 U/L SLN   Total Protein 6.4     6.0-8.3 g/dL SLN   Albumin 3.6     3.5-5.2 g/dL SLN   Calcium 9.4     8.4-10.5 mg/dL SLN   Hemoglobin A1C    Result: 01/02/2014 10:49 PM   ( Status: F )       Hemoglobin A1C 6.6   H <5.7 % SLN C Estimated Average Glucose 143   H <117 mg/dL SLN   Assessment/Plan 1. Orthostatic hypotension Stable   2. Constipation, unspecified constipation type Improved on amitiza   3. Type 2 diabetes mellitus with other diabetic neurological complication -well controlled on current medications   4. Acute cystitis without hematuria Ongoing dysuria, has appt with urology  for next week  5. Late effects of CVA (cerebrovascular accident) Ongoing right sided weakness, conts on baclofen and robaxin  Has gabapentin for neuropathies  conts on plavix and ASA  6. Depression -cries frequently during exam about the loss of her independence due to CVA and the fact she lives in a NH with ppl who are demented and she can not have conversations with. currently being followed by psych. Spoke with DON to see about getting some counseling set up. techniques discussed   7. Anemia, iron deficiency  hgb is stable. Will cont iron supplements

## 2014-02-02 DIAGNOSIS — N3941 Urge incontinence: Secondary | ICD-10-CM | POA: Diagnosis not present

## 2014-02-02 DIAGNOSIS — N390 Urinary tract infection, site not specified: Secondary | ICD-10-CM | POA: Diagnosis not present

## 2014-02-02 DIAGNOSIS — N319 Neuromuscular dysfunction of bladder, unspecified: Secondary | ICD-10-CM | POA: Diagnosis not present

## 2014-02-12 ENCOUNTER — Encounter (HOSPITAL_COMMUNITY): Payer: Self-pay

## 2014-02-12 ENCOUNTER — Inpatient Hospital Stay (HOSPITAL_COMMUNITY)
Admission: EM | Admit: 2014-02-12 | Discharge: 2014-02-14 | DRG: 189 | Disposition: A | Discharge status: Skilled Nursing Facility | Payer: Medicare Other | Attending: Internal Medicine | Admitting: Internal Medicine

## 2014-02-12 ENCOUNTER — Emergency Department (HOSPITAL_COMMUNITY): Payer: Medicare Other

## 2014-02-12 DIAGNOSIS — Z87442 Personal history of urinary calculi: Secondary | ICD-10-CM | POA: Diagnosis not present

## 2014-02-12 DIAGNOSIS — Z7982 Long term (current) use of aspirin: Secondary | ICD-10-CM | POA: Diagnosis not present

## 2014-02-12 DIAGNOSIS — Z809 Family history of malignant neoplasm, unspecified: Secondary | ICD-10-CM

## 2014-02-12 DIAGNOSIS — R4182 Altered mental status, unspecified: Secondary | ICD-10-CM | POA: Diagnosis present

## 2014-02-12 DIAGNOSIS — E1140 Type 2 diabetes mellitus with diabetic neuropathy, unspecified: Secondary | ICD-10-CM | POA: Diagnosis present

## 2014-02-12 DIAGNOSIS — Z96641 Presence of right artificial hip joint: Secondary | ICD-10-CM | POA: Diagnosis present

## 2014-02-12 DIAGNOSIS — M1990 Unspecified osteoarthritis, unspecified site: Secondary | ICD-10-CM | POA: Diagnosis present

## 2014-02-12 DIAGNOSIS — Z79899 Other long term (current) drug therapy: Secondary | ICD-10-CM | POA: Diagnosis not present

## 2014-02-12 DIAGNOSIS — R918 Other nonspecific abnormal finding of lung field: Secondary | ICD-10-CM | POA: Diagnosis not present

## 2014-02-12 DIAGNOSIS — R52 Pain, unspecified: Secondary | ICD-10-CM | POA: Diagnosis not present

## 2014-02-12 DIAGNOSIS — R0600 Dyspnea, unspecified: Secondary | ICD-10-CM | POA: Diagnosis not present

## 2014-02-12 DIAGNOSIS — I739 Peripheral vascular disease, unspecified: Secondary | ICD-10-CM | POA: Diagnosis present

## 2014-02-12 DIAGNOSIS — J9601 Acute respiratory failure with hypoxia: Principal | ICD-10-CM | POA: Diagnosis present

## 2014-02-12 DIAGNOSIS — I10 Essential (primary) hypertension: Secondary | ICD-10-CM | POA: Diagnosis present

## 2014-02-12 DIAGNOSIS — E875 Hyperkalemia: Secondary | ICD-10-CM | POA: Diagnosis present

## 2014-02-12 DIAGNOSIS — Z90710 Acquired absence of both cervix and uterus: Secondary | ICD-10-CM

## 2014-02-12 DIAGNOSIS — R410 Disorientation, unspecified: Secondary | ICD-10-CM

## 2014-02-12 DIAGNOSIS — N390 Urinary tract infection, site not specified: Secondary | ICD-10-CM | POA: Diagnosis present

## 2014-02-12 DIAGNOSIS — R0902 Hypoxemia: Secondary | ICD-10-CM | POA: Diagnosis not present

## 2014-02-12 DIAGNOSIS — E785 Hyperlipidemia, unspecified: Secondary | ICD-10-CM | POA: Diagnosis present

## 2014-02-12 DIAGNOSIS — H9190 Unspecified hearing loss, unspecified ear: Secondary | ICD-10-CM | POA: Diagnosis present

## 2014-02-12 DIAGNOSIS — Y95 Nosocomial condition: Secondary | ICD-10-CM | POA: Diagnosis present

## 2014-02-12 DIAGNOSIS — J189 Pneumonia, unspecified organism: Secondary | ICD-10-CM | POA: Diagnosis present

## 2014-02-12 DIAGNOSIS — N179 Acute kidney failure, unspecified: Secondary | ICD-10-CM | POA: Diagnosis present

## 2014-02-12 DIAGNOSIS — Z8673 Personal history of transient ischemic attack (TIA), and cerebral infarction without residual deficits: Secondary | ICD-10-CM

## 2014-02-12 DIAGNOSIS — Z9049 Acquired absence of other specified parts of digestive tract: Secondary | ICD-10-CM | POA: Diagnosis present

## 2014-02-12 DIAGNOSIS — Z87891 Personal history of nicotine dependence: Secondary | ICD-10-CM

## 2014-02-12 DIAGNOSIS — J9600 Acute respiratory failure, unspecified whether with hypoxia or hypercapnia: Secondary | ICD-10-CM | POA: Diagnosis not present

## 2014-02-12 DIAGNOSIS — R40241 Glasgow coma scale score 13-15: Secondary | ICD-10-CM | POA: Diagnosis not present

## 2014-02-12 DIAGNOSIS — S0990XA Unspecified injury of head, initial encounter: Secondary | ICD-10-CM | POA: Diagnosis not present

## 2014-02-12 DIAGNOSIS — I517 Cardiomegaly: Secondary | ICD-10-CM | POA: Diagnosis not present

## 2014-02-12 DIAGNOSIS — F17210 Nicotine dependence, cigarettes, uncomplicated: Secondary | ICD-10-CM | POA: Diagnosis not present

## 2014-02-12 LAB — URINALYSIS, ROUTINE W REFLEX MICROSCOPIC
Glucose, UA: NEGATIVE mg/dL
Ketones, ur: NEGATIVE mg/dL
NITRITE: NEGATIVE
Protein, ur: 100 mg/dL — AB
Specific Gravity, Urine: 1.017 (ref 1.005–1.030)
UROBILINOGEN UA: 1 mg/dL (ref 0.0–1.0)
pH: 5.5 (ref 5.0–8.0)

## 2014-02-12 LAB — COMPREHENSIVE METABOLIC PANEL
ALT: 31 U/L (ref 0–35)
AST: 33 U/L (ref 0–37)
Albumin: 3.6 g/dL (ref 3.5–5.2)
Alkaline Phosphatase: 79 U/L (ref 39–117)
Anion gap: 16 — ABNORMAL HIGH (ref 5–15)
BUN: 37 mg/dL — AB (ref 6–23)
CALCIUM: 9.8 mg/dL (ref 8.4–10.5)
CO2: 23 mEq/L (ref 19–32)
Chloride: 98 mEq/L (ref 96–112)
Creatinine, Ser: 1.29 mg/dL — ABNORMAL HIGH (ref 0.50–1.10)
GFR calc Af Amer: 50 mL/min — ABNORMAL LOW (ref >90)
GFR calc non Af Amer: 43 mL/min — ABNORMAL LOW (ref >90)
GLUCOSE: 156 mg/dL — AB (ref 70–99)
Potassium: 5.6 mEq/L — ABNORMAL HIGH (ref 3.7–5.3)
SODIUM: 137 meq/L (ref 137–147)
TOTAL PROTEIN: 7.5 g/dL (ref 6.0–8.3)
Total Bilirubin: 0.5 mg/dL (ref 0.3–1.2)

## 2014-02-12 LAB — BASIC METABOLIC PANEL
Anion gap: 11 (ref 5–15)
BUN: 36 mg/dL — AB (ref 6–23)
CO2: 25 meq/L (ref 19–32)
Calcium: 9.7 mg/dL (ref 8.4–10.5)
Chloride: 103 mEq/L (ref 96–112)
Creatinine, Ser: 1.14 mg/dL — ABNORMAL HIGH (ref 0.50–1.10)
GFR calc Af Amer: 58 mL/min — ABNORMAL LOW (ref >90)
GFR calc non Af Amer: 50 mL/min — ABNORMAL LOW (ref >90)
GLUCOSE: 128 mg/dL — AB (ref 70–99)
Potassium: 5.3 mEq/L (ref 3.7–5.3)
Sodium: 139 mEq/L (ref 137–147)

## 2014-02-12 LAB — CBC
HCT: 39.2 % (ref 36.0–46.0)
HEMOGLOBIN: 12.6 g/dL (ref 12.0–15.0)
MCH: 30.3 pg (ref 26.0–34.0)
MCHC: 32.1 g/dL (ref 30.0–36.0)
MCV: 94.2 fL (ref 78.0–100.0)
Platelets: 175 10*3/uL (ref 150–400)
RBC: 4.16 MIL/uL (ref 3.87–5.11)
RDW: 14.4 % (ref 11.5–15.5)
WBC: 10.1 10*3/uL (ref 4.0–10.5)

## 2014-02-12 LAB — URINE MICROSCOPIC-ADD ON

## 2014-02-12 LAB — MRSA PCR SCREENING: MRSA by PCR: NEGATIVE

## 2014-02-12 LAB — STREP PNEUMONIAE URINARY ANTIGEN: STREP PNEUMO URINARY ANTIGEN: NEGATIVE

## 2014-02-12 LAB — CBG MONITORING, ED: Glucose-Capillary: 246 mg/dL — ABNORMAL HIGH (ref 70–99)

## 2014-02-12 LAB — GLUCOSE, CAPILLARY: Glucose-Capillary: 126 mg/dL — ABNORMAL HIGH (ref 70–99)

## 2014-02-12 LAB — HIV ANTIBODY (ROUTINE TESTING W REFLEX): HIV 1&2 Ab, 4th Generation: NONREACTIVE

## 2014-02-12 MED ORDER — ALBUTEROL SULFATE (2.5 MG/3ML) 0.083% IN NEBU: 2.5000 mg | INHALATION_SOLUTION | RESPIRATORY_TRACT | Status: DC | PRN

## 2014-02-12 MED ORDER — INFLUENZA VAC SPLIT QUAD 0.5 ML IM SUSY
0.5000 mL | PREFILLED_SYRINGE | INTRAMUSCULAR | Status: DC
  Filled 2014-02-12 (×2): qty 0.5

## 2014-02-12 MED ORDER — SODIUM CHLORIDE 0.9 % IV BOLUS (SEPSIS)
500.0000 mL | Freq: Once | INTRAVENOUS | Status: AC
  Administered 2014-02-12: 500 mL via INTRAVENOUS

## 2014-02-12 MED ORDER — VITAMINS A & D EX OINT
TOPICAL_OINTMENT | CUTANEOUS | Status: AC
  Administered 2014-02-12: 16:00:00
  Filled 2014-02-12: qty 5

## 2014-02-12 MED ORDER — SODIUM CHLORIDE 0.9 % IV SOLN
500.0000 mg | INTRAVENOUS | Status: AC
  Administered 2014-02-12: 500 mg via INTRAVENOUS
  Filled 2014-02-12: qty 500

## 2014-02-12 MED ORDER — SODIUM CHLORIDE 0.9 % IV SOLN
INTRAVENOUS | Status: DC
  Administered 2014-02-12: 16:00:00 via INTRAVENOUS

## 2014-02-12 MED ORDER — ENOXAPARIN SODIUM 40 MG/0.4ML ~~LOC~~ SOLN
40.0000 mg | SUBCUTANEOUS | Status: DC
  Administered 2014-02-12 – 2014-02-13 (×2): 40 mg via SUBCUTANEOUS
  Filled 2014-02-12 (×3): qty 0.4

## 2014-02-12 MED ORDER — MIRABEGRON ER 25 MG PO TB24
25.0000 mg | ORAL_TABLET | Freq: Every day | ORAL | Status: DC
  Administered 2014-02-12 – 2014-02-14 (×3): 25 mg via ORAL
  Filled 2014-02-12 (×3): qty 1

## 2014-02-12 MED ORDER — METHOCARBAMOL 500 MG PO TABS
500.0000 mg | ORAL_TABLET | Freq: Every day | ORAL | Status: DC
  Administered 2014-02-12 – 2014-02-13 (×2): 500 mg via ORAL
  Filled 2014-02-12 (×5): qty 1

## 2014-02-12 MED ORDER — ASPIRIN 81 MG PO CHEW
81.0000 mg | CHEWABLE_TABLET | Freq: Every day | ORAL | Status: DC
  Administered 2014-02-12 – 2014-02-14 (×3): 81 mg via ORAL
  Filled 2014-02-12 (×3): qty 1

## 2014-02-12 MED ORDER — DEXTROSE 5 % IV SOLN: 1.0000 g | Freq: Three times a day (TID) | INTRAVENOUS | Status: DC

## 2014-02-12 MED ORDER — MAGNESIUM HYDROXIDE 400 MG/5ML PO SUSP: 30.0000 mL | Freq: Every day | ORAL | Status: DC | PRN

## 2014-02-12 MED ORDER — CLONAZEPAM 0.5 MG PO TABS
0.2500 mg | ORAL_TABLET | Freq: Two times a day (BID) | ORAL | Status: DC | PRN
  Administered 2014-02-12 – 2014-02-13 (×3): 0.25 mg via ORAL
  Filled 2014-02-12 (×3): qty 1

## 2014-02-12 MED ORDER — PRAVASTATIN SODIUM 40 MG PO TABS
40.0000 mg | ORAL_TABLET | Freq: Every day | ORAL | Status: DC
  Administered 2014-02-12 – 2014-02-14 (×3): 40 mg via ORAL
  Filled 2014-02-12 (×3): qty 1

## 2014-02-12 MED ORDER — INSULIN ASPART 100 UNIT/ML ~~LOC~~ SOLN
0.0000 [IU] | Freq: Three times a day (TID) | SUBCUTANEOUS | Status: DC
  Administered 2014-02-12 – 2014-02-13 (×3): 1 [IU] via SUBCUTANEOUS
  Administered 2014-02-14: 2 [IU] via SUBCUTANEOUS
  Administered 2014-02-14: 1 [IU] via SUBCUTANEOUS

## 2014-02-12 MED ORDER — VANCOMYCIN HCL 500 MG IV SOLR
500.0000 mg | Freq: Two times a day (BID) | INTRAVENOUS | Status: DC
  Administered 2014-02-13 – 2014-02-14 (×3): 500 mg via INTRAVENOUS
  Filled 2014-02-12 (×4): qty 500

## 2014-02-12 MED ORDER — LUBIPROSTONE 24 MCG PO CAPS
24.0000 ug | ORAL_CAPSULE | Freq: Two times a day (BID) | ORAL | Status: DC
  Administered 2014-02-12 – 2014-02-14 (×4): 24 ug via ORAL
  Filled 2014-02-12 (×6): qty 1

## 2014-02-12 MED ORDER — MAGNESIUM HYDROXIDE 800 MG/5ML PO SUSP: 30.0000 mL | Freq: Every day | ORAL | Status: DC | PRN

## 2014-02-12 MED ORDER — DULOXETINE HCL 60 MG PO CPEP
60.0000 mg | ORAL_CAPSULE | Freq: Every day | ORAL | Status: DC
  Administered 2014-02-12 – 2014-02-14 (×3): 60 mg via ORAL
  Filled 2014-02-12 (×3): qty 1

## 2014-02-12 MED ORDER — CLOPIDOGREL BISULFATE 75 MG PO TABS
75.0000 mg | ORAL_TABLET | Freq: Every day | ORAL | Status: DC
  Administered 2014-02-12 – 2014-02-14 (×3): 75 mg via ORAL
  Filled 2014-02-12 (×3): qty 1

## 2014-02-12 MED ORDER — INSULIN GLARGINE 100 UNIT/ML ~~LOC~~ SOLN
12.0000 [IU] | Freq: Every day | SUBCUTANEOUS | Status: DC
  Administered 2014-02-12 – 2014-02-13 (×2): 12 [IU] via SUBCUTANEOUS
  Filled 2014-02-12 (×3): qty 0.12

## 2014-02-12 MED ORDER — MIDODRINE HCL 5 MG PO TABS
5.0000 mg | ORAL_TABLET | Freq: Three times a day (TID) | ORAL | Status: DC
  Administered 2014-02-12 – 2014-02-14 (×4): 5 mg via ORAL
  Filled 2014-02-12 (×8): qty 1

## 2014-02-12 MED ORDER — DEXTROSE 5 % IV SOLN
1.0000 g | INTRAVENOUS | Status: AC
  Administered 2014-02-12: 1 g via INTRAVENOUS
  Filled 2014-02-12: qty 1

## 2014-02-12 MED ORDER — BACLOFEN 20 MG PO TABS
20.0000 mg | ORAL_TABLET | Freq: Every day | ORAL | Status: DC
  Administered 2014-02-12 – 2014-02-13 (×2): 20 mg via ORAL
  Filled 2014-02-12 (×3): qty 1

## 2014-02-12 MED ORDER — DEXTROSE 5 % IV SOLN
1.0000 g | Freq: Three times a day (TID) | INTRAVENOUS | Status: DC
  Administered 2014-02-13 – 2014-02-14 (×5): 1 g via INTRAVENOUS
  Filled 2014-02-12 (×7): qty 1

## 2014-02-12 NOTE — ED Notes (Signed)
Staff at the nursing facility state that she seems to be altered from her norm, pt is alert and oriented when she is awake but states that she didn't think she slept well, pt only complains of painful urination, per EMS patient is hypertensive.

## 2014-02-12 NOTE — ED Notes (Signed)
Bed: HT34 Expected date: 02/12/14 Expected time: 5:59 AM Means of arrival: Ambulance Comments: Ams, painful urination

## 2014-02-12 NOTE — ED Notes (Signed)
Spoke with Network engineer. Patient will arrive in 20 min. Secretary stated room number will change to 1404. Room is being clean.

## 2014-02-12 NOTE — ED Notes (Signed)
Need redraw on lavender tube, identifiers not visible on label. Redraw requested

## 2014-02-12 NOTE — H&P (Signed)
[facility] History and Physical  BLIA TOTMAN BTD:[id number] DOB: 1951/02/24 DOA: 02/12/2014  Referring physician: ED physician PCP: Hennie Duos, MD   Chief Complaint: dyspnea   HPI:  Pt is 63 yo female with history of stroke, HTN, HLD, DM, currently resides at SNF, presented to [facility] ED with main concern of several days duration of progressively worsening exertional dyspnea, productive cough of yellow sputum, subjective fevers, chills, poor oral intake. Her dyspnea is now present at rest as well. She also reports dysuria and urinary urgency and frequency. Pt denies chest pain, no LE edema, no recent weight gain or weight loss. She denies specific focal neurological deficits.   In ED, pt is hemodynamically stable, VSS, UA suggestive of UTI, CXR with possible PNA. [facility] asked to admit for further evaluation.  Assessment and Plan: Active Problems: Acute respiratory failure - appears to be secondary to developing PNA - since pt is resident of SNF, will treat with broad spectrum ABX Vancomycin and Maxipime  - will also obtain sputum culture, urine legionella and strep pneumo - provide BD's as needed - provide oxygen as needed  UTI - suggestive based on UA and pt's symptoms - current ABX should be adequate in coverage  - follow up on urine culture  DM type II with complications of PVD and neuropathy  - continue Lantus - also place on SSI as well but will hold on Metformin for now due to elevated Cr  HLD - continue stain Acute renal failure - appears to be be pre renal - will provide IVF and monitor renal function  Hyperkalemia - unclear etiology - will repeat BMP today and again in AM  Lovenox SQ for DVT prophylaxis   Radiological Exams on Admission: Dg Chest Port 1 View  02/12/2014 Very low inspiratory volumes with left greater than right bibasilar opacities which may reflect atelectasis or infiltrate/pneumonia. Stable cardiomegaly without evidence of edema/failure.      Code Status: Full Family Communication: Pt at bedside Disposition Plan: Admit for further evaluation    Review of Systems:  Constitutional: Negative for diaphoresis.  HENT: Negative for hearing loss, ear pain, nosebleeds, congestion, sore throat, neck pain, tinnitus and ear discharge.   Eyes: Negative for blurred vision, double vision, photophobia, pain, discharge and redness.  Respiratory: Negative for wheezing and stridor.   Cardiovascular: Negative for chest pain, palpitations, orthopnea, claudication and leg swelling.  Gastrointestinal: Negative for nausea, vomiting and abdominal pain.  Genitourinary: Negative for hematuria and flank pain.  Musculoskeletal: Negative for myalgias, back pain, joint pain and falls.  Skin: Negative for itching and rash.  Neurological: Negative for dizziness and weakness.  Endo/Heme/Allergies: Negative for environmental allergies and polydipsia. Does not bruise/bleed easily.  Psychiatric/Behavioral: Negative for suicidal ideas. The patient is not nervous/anxious.      Past Medical History  Diagnosis Date  . Diabetes mellitus   . Hypertension   . PAD (peripheral artery disease)     S/p bypass grafting, unclear exactly where  . Nephrolithiasis   . Hyperlipidemia 05/14/2011  . Hearing loss   . Complication of anesthesia     doesn't wake up good- "usually end up in ICU" also experiences post op nausea and vomiting  . Orthostatic hypotension     after surgery.  Marland Kitchen Heart murmur     PCP- [facility]- No tx needed.  . Stroke 05/2011    Weakness on Right. Wears leg brace.  . Foot drop, left   . H/O hiatal hernia  second one  . Diabetic neuropathy     Diabetic neuropathy- has inproved since she quit smoking.  . Arthritis   . Depression     Past Surgical History  Procedure Laterality Date  . Total hip arthroplasty  2008    Right  . Abdominal hysterectomy  2003  . Cesarean section  1984  . Cholecystectomy  1984  . Bypass graft   2003    Abdominal aortic  . Kidney stone surgery  1999  . Appendectomy  2011  . Hernia repair  2011  . Lithotripsy    . Abdominal hysterectomy    . Esophagogastroduodenoscopy N/A 05/09/2013    Procedure: ESOPHAGOGASTRODUODENOSCOPY (EGD);  Surgeon: Lafayette Dragon, MD;  Location: [facility] ENDOSCOPY;  Service: Endoscopy;  Laterality: N/A;  . Colonoscopy N/A 05/10/2013    Procedure: COLONOSCOPY;  Surgeon: Lafayette Dragon, MD;  Location: [facility] ENDOSCOPY;  Service: Endoscopy;  Laterality: N/A;    Social History:  reports that she has quit smoking. Her smoking use included Cigarettes. She smoked 0.00 packs per day for 45 years. She quit smokeless tobacco use about 2 years ago. She reports that she does not drink alcohol or use illicit drugs.  Allergies  Allergen Reactions  . Codeine Other (See Comments)    Per Mar  . Flexeril [Cyclobenzaprine Hcl] Other (See Comments)    Per Mar  . Lyrica [Pregabalin] Other (See Comments)    "wires my up"    Family History  Problem Relation Age of Onset  . Cancer Maternal Aunt     breast    Medication Sig  aspirin 81 MG chewable tablet Chew 81 mg by mouth daily.  baclofen (10 MG tablet Take 5 mg 2 times daily. 5 mg at 8a and 4pm  baclofen 20 MG tablet Take 20 mg by mouth at bedtime.  clonazePAM 0.5 MG tablet Take 0.25 mg bid prn  clopidogrel (PLAVIX) 75 MG tablet Take 75 mg by mouth daily.  DULoxetine  30 MG capsule Take 60 mg by mouth daily.  gabapentin 300 MG capsule Take 300 mg by mouth at bedtime.  insulin glargine  Inject 12 Units into the skin at bedtime.   LORazepam (ATIVAN) 0.5 MG tablet Take 1/2 tablet by mouth every morning at 9am; Take one tablet by mouth every evening at 9pm  lubiprostone (AMITIZA) 24 MCG capsule Take 24 mcg by mouth 2 (two) times daily with a meal.  metFORMIN (GLUCOPHAGE) 500 MG tablet Take 1,000 mg by mouth 2 (two) times daily with a meal.   methocarbamol (ROBAXIN) 500 MG tablet Take 1 tablet (500 mg total) by mouth at bedtime.   midodrine (PROAMATINE) 5 MG tablet Take 5 mg by mouth 3 (three) times daily.  mirabegron ER (MYRBETRIQ) 25 MG TB24 tablet Take 1 tablet (25 mg total) by mouth daily.  pravastatin (PRAVACHOL) 40 MG tablet Take 40 mg by mouth daily.   traMADol (ULTRAM) 50 MG tablet Take 2 tablets (100 mg total) by mouth every 8 (eight) hours as needed.    Physical Exam: Filed Vitals:   02/12/14 0730 02/12/14 0800 02/12/14 0830 02/12/14 0900  BP: 173/54 130/65 140/94 144/61  Pulse: 72 72 77 84  Temp:      TempSrc:      Resp: 25 14 18 17   SpO2: 95% 94% 94% 92%    Physical Exam  Constitutional: Appears well-developed and well-nourished. No distress.  HENT: Normocephalic. External right and left ear normal. Dry MM Eyes: Conjunctivae and EOM are normal. PERRLA,  no scleral icterus.  Neck: Normal ROM. Neck supple. No JVD. No tracheal deviation. No thyromegaly.  CVS: RRR, S1/S2 +, no murmurs, no gallops, no carotid bruit.  Pulmonary: Effort and breath sounds normal, no stridor, rhonchi at bases and diminished breath sounds at bases  Abdominal: Soft. BS +,  no distension, tenderness, rebound or guarding.  Musculoskeletal: Normal range of motion.  Lymphadenopathy: No lymphadenopathy noted, cervical, inguinal. Neuro: Alert. No cranial nerve deficit. Skin: Skin is warm and dry. No rash noted. Not diaphoretic. No erythema. No pallor.  Psychiatric: Normal mood and affect.  Labs on Admission:  Basic Metabolic Panel:  Recent Labs Lab 02/12/14 0653  NA 137  K 5.6*  CL 98  CO2 23  GLUCOSE 156*  BUN 37*  CREATININE 1.29*  CALCIUM 9.8   Liver Function Tests:  Recent Labs Lab 02/12/14 0653  AST 33  ALT 31  ALKPHOS 79  BILITOT 0.5  PROT 7.5  ALBUMIN 3.6   CBC:  Recent Labs Lab 02/12/14 0730  WBC 10.1  HGB 12.6  HCT 39.2  MCV 94.2  PLT 175   CBG:  Recent Labs Lab 02/12/14 0705  GLUCAP 246*    EKG: Normal sinus rhythm, no ST/T wave changes  Faye Ramsay, MD  [facility] Pager [phone]  If 7PM-7AM, please contact night-coverage [email or web address] Password [email or web address] 02/12/2014, 10:09 AM

## 2014-02-12 NOTE — ED Provider Notes (Signed)
CSN: [id number]     Arrival date & time 02/12/14  5329 History   First MD Initiated Contact with Patient 02/12/14 0701     Chief Complaint  Patient presents with  . Altered Mental Status     (Consider location/radiation/quality/duration/timing/severity/associated sxs/prior Treatment) Patient is a 63 y.o. female presenting with altered mental status.  Altered Mental Status Associated symptoms: weakness   Associated symptoms: no abdominal pain, no fever and no headaches    patient presents from nursing home for dysuria. States she's been burning for a few days. She is in the nursing home because of a previous stroke. Denies fever. Poorly has had some confusion but patient is appropriate on my exam. She is somewhat sleepy. Denies abdominal pain. Denies chest pain.  Past Medical History  Diagnosis Date  . Diabetes mellitus   . Hypertension   . PAD (peripheral artery disease)     S/p bypass grafting, unclear exactly where  . Nephrolithiasis   . Hyperlipidemia 05/14/2011  . Hearing loss   . Complication of anesthesia     doesn't wake up good- "usually end up in ICU" also experiences post op nausea and vomiting  . Orthostatic hypotension     after surgery.  Marland Kitchen Heart murmur     PCP- [facility]- No tx needed.  . Stroke 05/2011    Weakness on Right. Wears leg brace.  . Foot drop, left   . H/O hiatal hernia     second one  . Diabetic neuropathy     Diabetic neuropathy- has inproved since she quit smoking.  . Arthritis   . Depression    Past Surgical History  Procedure Laterality Date  . Total hip arthroplasty  2008    Right  . Abdominal hysterectomy  2003  . Cesarean section  1984  . Cholecystectomy  1984  . Bypass graft  2003    Abdominal aortic  . Kidney stone surgery  1999  . Appendectomy  2011  . Hernia repair  2011  . Lithotripsy    . Abdominal hysterectomy    . Esophagogastroduodenoscopy N/A 05/09/2013    Procedure: ESOPHAGOGASTRODUODENOSCOPY (EGD);  Surgeon:  Lafayette Dragon, MD;  Location: [facility] ENDOSCOPY;  Service: Endoscopy;  Laterality: N/A;  . Colonoscopy N/A 05/10/2013    Procedure: COLONOSCOPY;  Surgeon: Lafayette Dragon, MD;  Location: [facility] ENDOSCOPY;  Service: Endoscopy;  Laterality: N/A;   Family History  Problem Relation Age of Onset  . Cancer Maternal Aunt     breast   History  Substance Use Topics  . Smoking status: Former Smoker -- 0.00 packs/day for 45 years    Types: Cigarettes  . Smokeless tobacco: Former Systems developer    Quit date: 05/08/2011  . Alcohol Use: No   OB History    No data available     Review of Systems  Constitutional: Negative for fever and appetite change.  Respiratory: Negative for chest tightness.   Cardiovascular: Negative for chest pain.  Gastrointestinal: Negative for abdominal pain.  Genitourinary: Positive for dysuria.  Musculoskeletal: Negative for back pain.  Skin: Negative for wound.  Neurological: Positive for weakness. Negative for headaches.       Chronic right-sided weakness      Allergies  Codeine; Flexeril; and Lyrica  Home Medications   Prior to Admission medications   Medication Sig Start Date End Date Taking? Authorizing Provider  acetaminophen (TYLENOL) 325 MG tablet Take 650 mg by mouth every 6 (six) hours as needed for pain.  Yes Historical Provider, MD  aspirin 81 MG chewable tablet Chew 81 mg by mouth daily.   Yes Historical Provider, MD  baclofen (LIORESAL) 10 MG tablet Take 5 mg by mouth 2 (two) times daily. 5 mg at 8a and 4pm   Yes Historical Provider, MD  baclofen (LIORESAL) 20 MG tablet Take 20 mg by mouth at bedtime.   Yes Historical Provider, MD  cholecalciferol (VITAMIN D) 1000 UNITS tablet Take 1,000 Units by mouth daily.   Yes Historical Provider, MD  clonazePAM (KLONOPIN) 0.5 MG tablet Take 0.25 mg by mouth 2 (two) times daily as needed for anxiety.   Yes Historical Provider, MD  clopidogrel (PLAVIX) 75 MG tablet Take 75 mg by mouth daily.   Yes Historical Provider, MD   DULoxetine (CYMBALTA) 30 MG capsule Take 60 mg by mouth daily. 10/27/12  Yes Lauree Chandler, NP  ferrous sulfate 325 (65 FE) MG tablet Take 325 mg by mouth 3 (three) times daily.    Yes Historical Provider, MD  gabapentin (NEURONTIN) 300 MG capsule Take 300 mg by mouth at bedtime.   Yes Historical Provider, MD  insulin glargine (LANTUS) 100 UNIT/ML injection Inject 12 Units into the skin at bedtime.    Yes Historical Provider, MD  LORazepam (ATIVAN) 0.5 MG tablet Take 1/2 tablet by mouth every morning at 9am; Take one tablet by mouth every evening at 9pm 11/14/13  Yes Tiffany L Reed, DO  lubiprostone (AMITIZA) 24 MCG capsule Take 24 mcg by mouth 2 (two) times daily with a meal.   Yes Historical Provider, MD  magnesium hydroxide (MILK OF MAGNESIA) 800 MG/5ML suspension Take 30 mLs by mouth daily as needed for constipation. 30cc   Yes Historical Provider, MD  Menthol, Topical Analgesic, (BIOFREEZE) 4 % GEL To right ankle and bilateral knees TID 08/27/13  Yes Lauree Chandler, NP  menthol-cetylpyridinium (CEPACOL) 3 MG lozenge Take 1 lozenge by mouth every 4 (four) hours as needed for sore throat.   Yes Historical Provider, MD  metFORMIN (GLUCOPHAGE) 500 MG tablet Take 1,000 mg by mouth 2 (two) times daily with a meal.    Yes Historical Provider, MD  methocarbamol (ROBAXIN) 500 MG tablet Take 1 tablet (500 mg total) by mouth at bedtime. 08/27/13  Yes Lauree Chandler, NP  midodrine (PROAMATINE) 5 MG tablet Take 5 mg by mouth 3 (three) times daily.   Yes Historical Provider, MD  mirabegron ER (MYRBETRIQ) 25 MG TB24 tablet Take 1 tablet (25 mg total) by mouth daily. 08/27/13  Yes Lauree Chandler, NP  polyethylene glycol (MIRALAX / GLYCOLAX) packet Take 17 g by mouth daily as needed for mild constipation.    Yes Historical Provider, MD  pravastatin (PRAVACHOL) 40 MG tablet Take 40 mg by mouth daily.  04/11/11  Yes Historical Provider, MD  traMADol (ULTRAM) 50 MG tablet Take 2 tablets (100 mg total) by  mouth every 8 (eight) hours as needed. 08/12/13  Yes Tiffany L Reed, DO  traMADol (ULTRAM) 50 MG tablet Take two tablets by mouth every 8 hours as needed for pain 12/12/13  Yes Lauree Chandler, NP  Wheat Dextrin (BENEFIBER) POWD Take 1 Applicatorful by mouth daily. Mix with 8oz of fluid   Yes Historical Provider, MD  traMADol (ULTRAM-ER) 100 MG 24 hr tablet Take 2 tablets by mouth every 8 hours as needed. Patient not taking: Reported on 02/12/2014 08/11/13   Tiffany L Reed, DO   BP 157/60 mmHg  Pulse 83  Temp(Src) 97.9 F (36.6  C) (Oral)  Resp 14  Ht 5\' 4"  (1.626 m)  Wt 152 lb (68.947 kg)  BMI 26.08 kg/m2  SpO2 96% Physical Exam  Constitutional: She appears well-developed and well-nourished.  Eyes: Pupils are equal, round, and reactive to light.  Cardiovascular: Normal rate and regular rhythm.   Pulmonary/Chest: Effort normal and breath sounds normal.  Abdominal: There is no tenderness.  Musculoskeletal: She exhibits no tenderness.  Neurological: She is alert.  Chronic right-sided weakness and right facial droop.  Skin: No rash noted.    ED Course  Procedures (including critical care time) Labs Review Labs Reviewed  COMPREHENSIVE METABOLIC PANEL - Abnormal; Notable for the following:    Potassium 5.6 (*)    Glucose, Bld 156 (*)    BUN 37 (*)    Creatinine, Ser 1.29 (*)    GFR calc non Af Amer 43 (*)    GFR calc Af Amer 50 (*)    Anion gap 16 (*)    All other components within normal limits  URINALYSIS, ROUTINE W REFLEX MICROSCOPIC - Abnormal; Notable for the following:    APPearance TURBID (*)    Hgb urine dipstick MODERATE (*)    Bilirubin Urine SMALL (*)    Protein, ur 100 (*)    Leukocytes, UA LARGE (*)    All other components within normal limits  URINE MICROSCOPIC-ADD ON - Abnormal; Notable for the following:    Bacteria, UA MANY (*)    All other components within normal limits  CBG MONITORING, ED - Abnormal; Notable for the following:    Glucose-Capillary 246  (*)    All other components within normal limits  URINE CULTURE  CBC    Imaging Review Dg Chest Port 1 View  02/12/2014   CLINICAL DATA:  63 year old female with hypoxia  EXAM: PORTABLE CHEST - 1 VIEW  COMPARISON:  Prior chest x-ray 05/07/2013  FINDINGS: Very low inspiratory volumes. Left greater than right retrocardiac opacities. Cardiac and mediastinal contours remain within normal limits. Stable cardiomegaly. No pulmonary edema. Mild central bronchitic change are similar compared to prior. No pleural effusion or pneumothorax. No acute osseous abnormality.  IMPRESSION: Very low inspiratory volumes with left greater than right bibasilar opacities which may reflect atelectasis or infiltrate/pneumonia.  Stable cardiomegaly without evidence of edema/failure.   Electronically Signed   By: Jacqulynn Cadet M.D.   On: 02/12/2014 08:22     EKG Interpretation   Date/Time:  Sunday February 12 2014 07:12:47 EST Ventricular Rate:  76 PR Interval:  159 QRS Duration: 81 QT Interval:  405 QTC Calculation: 455 R Axis:   36 Text Interpretation:  Sinus rhythm Probable LVH with secondary repol abnrm  No significant change since last tracing Confirmed by Alvino Chapel  MD,  Ovid Curd [phone]) on 02/12/2014 8:05:42 AM      MDM   Final diagnoses:  Hypoxia  HCAP (healthcare-associated pneumonia)  UTI (lower urinary tract infection)    Patient from nursing home. Urinary symptoms and possible UTI. X-ray shows possible pneumonia. She has had some mild hypoxia here. Will admit to internal medicine. Admitting doctor will decide on antibiotic choice.    Jasper Riling. Alvino Chapel, MD 02/12/14 1246

## 2014-02-12 NOTE — Progress Notes (Signed)
ANTIBIOTIC CONSULT NOTE - INITIAL  Pharmacy Consult for vancomycin, cefepime Indication: HCAP, possible UTI  Allergies  Allergen Reactions  . Codeine Other (See Comments)    Per Mar  . Flexeril [Cyclobenzaprine Hcl] Other (See Comments)    Per Mar  . Lyrica [Pregabalin] Other (See Comments)    "wires my up"    Patient Measurements: Height: 5\' 4"  (162.6 cm) Weight: 152 lb (68.947 kg) IBW/kg (Calculated) : 54.7  Vital Signs: Temp: 98.3 F (36.8 C) (12/13 1349) Temp Source: Oral (12/13 1349) BP: 149/64 mmHg (12/13 1349) Pulse Rate: 87 (12/13 1349) Intake/Output from previous day:   Intake/Output from this shift:    Labs:  Recent Labs  02/12/14 0653 02/12/14 0730  WBC  --  10.1  HGB  --  12.6  PLT  --  175  CREATININE 1.29*  --    Estimated Creatinine Clearance: 43.1 mL/min (by C-G formula based on Cr of 1.29). No results for input(s): VANCOTROUGH, VANCOPEAK, VANCORANDOM, GENTTROUGH, GENTPEAK, GENTRANDOM, TOBRATROUGH, TOBRAPEAK, TOBRARND, AMIKACINPEAK, AMIKACINTROU, AMIKACIN in the last 72 hours.   Microbiology: No results found for this or any previous visit (from the past 720 hour(s)).  Medical History: Past Medical History  Diagnosis Date  . Diabetes mellitus   . Hypertension   . PAD (peripheral artery disease)     S/p bypass grafting, unclear exactly where  . Nephrolithiasis   . Hyperlipidemia 05/14/2011  . Hearing loss   . Complication of anesthesia     doesn't wake up good- "usually end up in ICU" also experiences post op nausea and vomiting  . Orthostatic hypotension     after surgery.  Marland Kitchen Heart murmur     PCP- [facility]- No tx needed.  . Stroke 05/2011    Weakness on Right. Wears leg brace.  . Foot drop, left   . H/O hiatal hernia     second one  . Diabetic neuropathy     Diabetic neuropathy- has inproved since she quit smoking.  . Arthritis   . Depression     Medications:  Scheduled:  . aspirin  81 mg Oral Daily  . baclofen   20 mg Oral QHS  . clopidogrel  75 mg Oral Daily  . DULoxetine  60 mg Oral Daily  . enoxaparin (LOVENOX) injection  40 mg Subcutaneous Q24H  . insulin aspart  0-9 Units Subcutaneous TID WC  . insulin glargine  12 Units Subcutaneous QHS  . lubiprostone  24 mcg Oral BID WC  . methocarbamol  500 mg Oral QHS  . midodrine  5 mg Oral TID  . mirabegron ER  25 mg Oral Daily  . pravastatin  40 mg Oral Daily   Infusions:  . sodium chloride    . ceFEPime (MAXIPIME) IV     Assessment: 63 yo from NH presented to ER with AMS. PMH includes DM, HTN, and PAD. To start vancomycin and cefepime for possible UTI and HCAP  12/13 >> vancomycin >> 12/13 >> cefepime >>    Tmax: afebrile WBCs: 10.1 Renal: Scr 1.29, CrCl 43  Goal of Therapy:  Vancomycin trough level 15-20 mcg/ml  Plan:  1) Vancomycin 500mg  IV q12 per current weight and renal function 2) Cefepime 1g IV q12 for CrCl 30-50 ml/min   Adrian Saran, PharmD, BCPS Pager [phone] 02/12/2014 2:49 PM

## 2014-02-13 DIAGNOSIS — J189 Pneumonia, unspecified organism: Secondary | ICD-10-CM

## 2014-02-13 LAB — CBC
HEMATOCRIT: 36.4 % (ref 36.0–46.0)
HEMOGLOBIN: 11.8 g/dL — AB (ref 12.0–15.0)
MCH: 30 pg (ref 26.0–34.0)
MCHC: 32.4 g/dL (ref 30.0–36.0)
MCV: 92.6 fL (ref 78.0–100.0)
PLATELETS: 173 10*3/uL (ref 150–400)
RBC: 3.93 MIL/uL (ref 3.87–5.11)
RDW: 14 % (ref 11.5–15.5)
WBC: 7.2 10*3/uL (ref 4.0–10.5)

## 2014-02-13 LAB — GLUCOSE, CAPILLARY
GLUCOSE-CAPILLARY: 135 mg/dL — AB (ref 70–99)
GLUCOSE-CAPILLARY: 130 mg/dL — AB (ref 70–99)
GLUCOSE-CAPILLARY: 191 mg/dL — AB (ref 70–99)
GLUCOSE-CAPILLARY: 203 mg/dL — AB (ref 70–99)
Glucose-Capillary: 199 mg/dL — ABNORMAL HIGH (ref 70–99)

## 2014-02-13 LAB — BASIC METABOLIC PANEL
Anion gap: 13 (ref 5–15)
BUN: 29 mg/dL — AB (ref 6–23)
CO2: 22 meq/L (ref 19–32)
Calcium: 9.7 mg/dL (ref 8.4–10.5)
Chloride: 104 mEq/L (ref 96–112)
Creatinine, Ser: 0.93 mg/dL (ref 0.50–1.10)
GFR calc Af Amer: 75 mL/min — ABNORMAL LOW (ref >90)
GFR, EST NON AFRICAN AMERICAN: 65 mL/min — AB (ref >90)
GLUCOSE: 124 mg/dL — AB (ref 70–99)
POTASSIUM: 4.6 meq/L (ref 3.7–5.3)
Sodium: 139 mEq/L (ref 137–147)

## 2014-02-13 LAB — URINE CULTURE
Colony Count: NO GROWTH
Culture: NO GROWTH
Special Requests: NORMAL

## 2014-02-13 LAB — CREATININE, URINE, RANDOM: Creatinine, Urine: 124.2 mg/dL

## 2014-02-13 MED ORDER — HYDRALAZINE HCL 20 MG/ML IJ SOLN
10.0000 mg | INTRAMUSCULAR | Status: DC | PRN
  Administered 2014-02-13: 10 mg via INTRAVENOUS
  Filled 2014-02-13: qty 1

## 2014-02-13 MED ORDER — GLUCERNA SHAKE PO LIQD
237.0000 mL | Freq: Two times a day (BID) | ORAL | Status: DC
  Administered 2014-02-13 – 2014-02-14 (×2): 237 mL via ORAL
  Filled 2014-02-13 (×3): qty 237

## 2014-02-13 MED ORDER — HYDRALAZINE HCL 10 MG PO TABS
10.0000 mg | ORAL_TABLET | Freq: Three times a day (TID) | ORAL | Status: DC
Start: 2014-02-13 — End: 2014-02-14
  Administered 2014-02-13 – 2014-02-14 (×3): 10 mg via ORAL
  Filled 2014-02-13 (×7): qty 1

## 2014-02-13 NOTE — Progress Notes (Signed)
INITIAL NUTRITION ASSESSMENT  DOCUMENTATION CODES Per approved criteria  -Not Applicable   INTERVENTION: -Recommend Glucerna Shake po BID, each supplement provides 220 kcal and 10 grams of protein -Continue with staff feeding assistance and encouragement -RD to continue to monitor  NUTRITION DIAGNOSIS: Inadequate oral intake related to confusion as evidenced by PO intake < 75%.   Goal: Pt to meet >/= 90% of their estimated nutrition needs    Monitor:  Total protein/energy intake, labs, weights, supplement acceptance  Reason for Assessment: Low Braden  63 y.o. female  Admitting Dx: <principal problem not specified>  ASSESSMENT: Pt is 63 yo female with history of stroke, HTN, HLD, DM, currently resides at SNF, presented to [facility] ED with main concern of several days duration of progressively worsening exertional dyspnea, productive cough of yellow sputum, subjective fevers, chills, poor oral intake.  -Per discussion with pt's RN, pt with poor PO intake. Consuming < 25% of meals. Needs constant encouragement to eat. Pt gets distracted and confused, which contributes to pt poor PO intake -RN had spoken with pt's daughter earlier today, who reported pt with poor PO intake for at least several days before admit. Was unsure of weight loss -Pt refused nutrition focused physical exam, stating someone had performed it earlier. Reported weight loss, but was unsure when weight loss began or how much. -Previous medical records indicate wt stable ~150 lbs -Evaluated by SLP on 12/14, noted pt distracted; however able to tolerate regular diet textures  -Pt Was upset with lunch received, 0% meal completion. Refused meal substitutions offered by RD -RN in agreement to trial supplement. Pt with poor PO intake, but hx of DM2. CBGS slightly elevated, will order Glucerna BID  Height: Ht Readings from Last 1 Encounters:  02/12/14 5\' 4"  (1.626 m)    Weight: Wt Readings from Last 1 Encounters:   02/12/14 152 lb (68.947 kg)    Ideal Body Weight: 120 lb  % Ideal Body Weight: 127%  Wt Readings from Last 10 Encounters:  02/12/14 152 lb (68.947 kg)  01/24/14 150 lb (68.04 kg)  10/03/13 152 lb (68.947 kg)  07/08/13 148 lb (67.132 kg)  05/10/13 154 lb (69.854 kg)  12/21/12 149 lb (67.586 kg)  11/25/12 150 lb (68.04 kg)  08/19/12 137 lb (62.143 kg)  08/09/12 139 lb (63.05 kg)  07/08/12 138 lb (62.596 kg)    Usual Body Weight: 150 lb  % Usual Body Weight: 100%  BMI:  Body mass index is 26.08 kg/(m^2).  Estimated Nutritional Needs: Kcal: 1500-1700 Protein: 70-80 gram Fluid: >/= 1700 ml daily  Skin: WDL  Diet Order: Diet regular  EDUCATION NEEDS: -No education needs identified at this time   Intake/Output Summary (Last 24 hours) at 02/13/14 1408 Last data filed at 02/13/14 0900  Gross per 24 hour  Intake   1385 ml  Output      0 ml  Net   1385 ml    Last BM: PTA   Labs:   Recent Labs Lab 02/12/14 0653 02/12/14 1537 02/13/14 0419  NA 137 139 139  K 5.6* 5.3 4.6  CL 98 103 104  CO2 23 25 22   BUN 37* 36* 29*  CREATININE 1.29* 1.14* 0.93  CALCIUM 9.8 9.7 9.7  GLUCOSE 156* 128* 124*    CBG (last 3)   Recent Labs  02/12/14 2141 02/13/14 0754 02/13/14 1237  GLUCAP 199* 135* 130*    Scheduled Meds: . aspirin  81 mg Oral Daily  . baclofen  20 mg  Oral QHS  . ceFEPime (MAXIPIME) IV  1 g Intravenous Q8H  . clopidogrel  75 mg Oral Daily  . DULoxetine  60 mg Oral Daily  . enoxaparin (LOVENOX) injection  40 mg Subcutaneous Q24H  . Influenza vac split quadrivalent PF  0.5 mL Intramuscular Tomorrow-1000  . insulin aspart  0-9 Units Subcutaneous TID WC  . insulin glargine  12 Units Subcutaneous QHS  . lubiprostone  24 mcg Oral BID WC  . methocarbamol  500 mg Oral QHS  . midodrine  5 mg Oral TID WC  . mirabegron ER  25 mg Oral Daily  . pravastatin  40 mg Oral Daily  . vancomycin  500 mg Intravenous Q12H    Continuous Infusions: . sodium  chloride 75 mL/hr at 02/12/14 1618    Past Medical History  Diagnosis Date  . Diabetes mellitus   . Hypertension   . PAD (peripheral artery disease)     S/p bypass grafting, unclear exactly where  . Nephrolithiasis   . Hyperlipidemia 05/14/2011  . Hearing loss   . Complication of anesthesia     doesn't wake up good- "usually end up in ICU" also experiences post op nausea and vomiting  . Orthostatic hypotension     after surgery.  Marland Kitchen Heart murmur     PCP- [facility]- No tx needed.  . Stroke 05/2011    Weakness on Right. Wears leg brace.  . Foot drop, left   . H/O hiatal hernia     second one  . Diabetic neuropathy     Diabetic neuropathy- has inproved since she quit smoking.  . Arthritis   . Depression     Past Surgical History  Procedure Laterality Date  . Total hip arthroplasty  2008    Right  . Abdominal hysterectomy  2003  . Cesarean section  1984  . Cholecystectomy  1984  . Bypass graft  2003    Abdominal aortic  . Kidney stone surgery  1999  . Appendectomy  2011  . Hernia repair  2011  . Lithotripsy    . Abdominal hysterectomy    . Esophagogastroduodenoscopy N/A 05/09/2013    Procedure: ESOPHAGOGASTRODUODENOSCOPY (EGD);  Surgeon: Lafayette Dragon, MD;  Location: [facility] ENDOSCOPY;  Service: Endoscopy;  Laterality: N/A;  . Colonoscopy N/A 05/10/2013    Procedure: COLONOSCOPY;  Surgeon: Lafayette Dragon, MD;  Location: [facility] ENDOSCOPY;  Service: Endoscopy;  Laterality: N/A;    Atlee Abide MS RD LDN Clinical Dietitian [phone]

## 2014-02-13 NOTE — Progress Notes (Signed)
Patient ID: Chelsey Gallagher, female   DOB: 1950/08/03, 63 y.o.   MRN: [id number]  [facility] PROGRESS NOTE  Chelsey Gallagher:[id number] DOB: 1950-03-31 DOA: 02/12/2014 PCP: Hennie Duos, MD  Brief narrative: HPI:  Pt is 63 yo female with history of stroke, HTN, HLD, DM, currently resides at SNF, presented to [facility] ED with main concern of several days duration of progressively worsening exertional dyspnea, productive cough of yellow sputum, subjective fevers, chills, poor oral intake. Her dyspnea is now present at rest as well. She also reports dysuria and urinary urgency and frequency. Pt denies chest pain, no LE edema, no recent weight gain or weight loss. She denies specific focal neurological deficits.   In ED, pt is hemodynamically stable, VSS, UA suggestive of UTI, CXR with possible PNA. [facility] asked to admit for further evaluation.  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  - continue Lantus - also place on SSI as well but will hold on Metformin for now due to elevated Cr  HLD - continue stain Acute renal failure - appears to be be pre renal - IVF provided and Cr is WNL this AM Hyperkalemia - unclear etiology  repeat BMP indicates K is WNL   DVT prophylaxis  Lovenox SQ while pt is in hospital  Code Status: Full Family Communication: Pt at bedside Disposition Plan: SNF when medically stable   IV Access:   Peripheral IV Procedures and diagnostic studies:    Dg Chest Port 1 View  02/12/2014    Very low inspiratory volumes with left greater than right bibasilar opacities which may reflect atelectasis or infiltrate/pneumonia.  Stable cardiomegaly without evidence of edema/failure.   Medical Consultants:   None  Other Consultants:   Physical therapy  Anti-Infectives:   Vancomycin 12/13 --> Maxipime 12/13 -->  Faye Ramsay, MD  [facility] Pager [phone]  If 7PM-7AM, please contact night-coverage [email or web address] Password [email or web address] 02/13/2014, 2:28 PM   LOS: 1 day   HPI/Subjective: No events overnight.   Objective: Filed Vitals:   02/12/14 1957 02/12/14 2142 02/12/14 2331 02/13/14 0355  BP: 186/74 181/90 162/78 172/90  Pulse: 102   92  Temp: 99.1 F (37.3 C)   99.2 F (37.3 C)  TempSrc: Oral   Oral  Resp: 20   18  Height:      Weight:      SpO2: 96%   94%    Intake/Output Summary (Last 24 hours) at 02/13/14 1428 Last data filed at 02/13/14 0900  Gross per 24 hour  Intake   1385 ml  Output      0 ml  Net   1385 ml    Exam:   General:  Pt is alert, follows commands appropriately, not in acute distress  Cardiovascular: Regular rate and rhythm, S1/S2, no murmurs, no rubs, no gallops  Respiratory: Clear to auscultation bilaterally, no wheezing, mild rhonchi at bases   Abdomen: Soft, non tender, non distended, bowel sounds present, no guarding  Extremities: No edema, pulses  DP and PT palpable bilaterally   Data Reviewed: Basic Metabolic Panel:  Recent Labs Lab 02/12/14 0653 02/12/14 1537 02/13/14 0419  NA 137 139 139  K 5.6* 5.3 4.6  CL 98 103 104  CO2 23 25 22   GLUCOSE 156* 128* 124*  BUN 37* 36* 29*  CREATININE 1.29* 1.14* 0.93  CALCIUM 9.8 9.7 9.7   Liver Function Tests:  Recent Labs Lab 02/12/14 0653  AST 33  ALT 31  ALKPHOS 79  BILITOT 0.5  PROT 7.5  ALBUMIN 3.6   CBC:  Recent Labs Lab 02/12/14 0730 02/13/14 0419  WBC 10.1 7.2  HGB 12.6 11.8*  HCT 39.2 36.4  MCV 94.2 92.6  PLT 175 173   CBG:  Recent Labs Lab  02/12/14 0705 02/12/14 1740 02/12/14 2141 02/13/14 0754 02/13/14 1237  GLUCAP 246* 126* 199* 135* 130*    Recent Results (from the past 240 hour(s))  MRSA PCR Screening     Status: None   Collection Time: 02/12/14  3:11 PM  Result Value Ref Range Status   MRSA by PCR NEGATIVE NEGATIVE Final    Comment:        The GeneXpert MRSA Assay (FDA approved for NASAL specimens only), is one component of a comprehensive MRSA colonization surveillance program. It is not intended to diagnose MRSA infection nor to guide or monitor treatment for MRSA infections.   Culture, blood (routine x 2) Call MD if unable to obtain prior to antibiotics being given     Status: None (Preliminary result)   Collection Time: 02/12/14  3:30 PM  Result Value Ref Range Status   Specimen Description BLOOD RIGHT HAND  Final   Special Requests   Final    BOTTLES DRAWN AEROBIC AND ANAEROBIC 10CC BOTH BOTTLES   Culture  Setup Time   Final    02/12/2014 21:03 Performed at [facility]    Culture   Final           BLOOD CULTURE RECEIVED NO GROWTH TO DATE CULTURE WILL BE HELD FOR 5 DAYS BEFORE ISSUING A FINAL NEGATIVE REPORT Performed at [facility]    Report Status PENDING  Incomplete  Culture, blood (routine x 2) Call MD if unable to obtain prior to antibiotics being given     Status: None (Preliminary result)   Collection Time: 02/12/14  3:38 PM  Result Value Ref Range Status   Specimen Description BLOOD LEFT HAND  Final   Special Requests   Final    BOTTLES DRAWN AEROBIC AND ANAEROBIC 10CC BOTH BOTTLES   Culture  Setup Time   Final    02/12/2014 21:03 Performed at [facility]    Culture   Final           BLOOD CULTURE RECEIVED NO GROWTH TO DATE CULTURE WILL BE HELD FOR 5 DAYS BEFORE ISSUING A FINAL NEGATIVE REPORT Performed at [facility]    Report Status PENDING  Incomplete     Scheduled Meds: . aspirin  81 mg Oral Daily  . baclofen  20 mg Oral QHS  . ceFEPime  IV  1 g Intravenous Q8H  . clopidogrel  75 mg Oral Daily  . DULoxetine  60 mg Oral Daily  . enoxaparin  injection  40 mg Subcutaneous Q24H  . insulin aspart  0-9 Units Subcutaneous TID WC  . insulin glargine  12 Units Subcutaneous QHS  . lubiprostone  24 mcg Oral BID WC  . methocarbamol  500 mg Oral  QHS  . midodrine  5 mg Oral TID WC  . mirabegron ER  25 mg Oral Daily  . pravastatin  40 mg Oral Daily  . vancomycin  500 mg Intravenous Q12H   Continuous Infusions: . sodium chloride 75 mL/hr at 02/12/14 1618

## 2014-02-13 NOTE — Evaluation (Signed)
Clinical/Bedside Swallow Evaluation Patient Details  Name: Chelsey Gallagher MRN: [id number] Date of Birth: May 12, 1950  Today's Date: 02/13/2014 Time: 6295-2841 SLP Time Calculation (min) (ACUTE ONLY): 26 min  Past Medical History:  Past Medical History  Diagnosis Date  . Diabetes mellitus   . Hypertension   . PAD (peripheral artery disease)     S/p bypass grafting, unclear exactly where  . Nephrolithiasis   . Hyperlipidemia 05/14/2011  . Hearing loss   . Complication of anesthesia     doesn't wake up good- "usually end up in ICU" also experiences post op nausea and vomiting  . Orthostatic hypotension     after surgery.  Marland Kitchen Heart murmur     PCP- [facility]- No tx needed.  . Stroke 05/2011    Weakness on Right. Wears leg brace.  . Foot drop, left   . H/O hiatal hernia     second one  . Diabetic neuropathy     Diabetic neuropathy- has inproved since she quit smoking.  . Arthritis   . Depression    Past Surgical History:  Past Surgical History  Procedure Laterality Date  . Total hip arthroplasty  2008    Right  . Abdominal hysterectomy  2003  . Cesarean section  1984  . Cholecystectomy  1984  . Bypass graft  2003    Abdominal aortic  . Kidney stone surgery  1999  . Appendectomy  2011  . Hernia repair  2011  . Lithotripsy    . Abdominal hysterectomy    . Esophagogastroduodenoscopy N/A 05/09/2013    Procedure: ESOPHAGOGASTRODUODENOSCOPY (EGD);  Surgeon: Lafayette Dragon, MD;  Location: [facility] ENDOSCOPY;  Service: Endoscopy;  Laterality: N/A;  . Colonoscopy N/A 05/10/2013    Procedure: COLONOSCOPY;  Surgeon: Lafayette Dragon, MD;  Location: [facility] ENDOSCOPY;  Service: Endoscopy;  Laterality: N/A;   HPI:  63 yo female resident of SNF adm to [facility] with HCAP.  Pt with PMH + for smoking, CVA with right HP, gastric ulcers.  CXR showed left more than right lung opacity.  Swallow evaluation ordered.    Assessment / Plan / Recommendation Clinical Impression  Pt presents with symptoms  of mild oropharyngeal dysphagia suspected to be baseline due to h/o CVA.  Mildly delayed oral transiting with oral pocketing on right noted effectively clearing with dry swallows.  Pt did demonstrate a single episode of aspiration likely due to talking and eating at the same time.  Suspect premature spillage of liquids into larynx/trachea.  Pt also distracted by ? visual hallucination - seeing dots on the wall move when eating.  When pt calmed and was allowed to focus on eating, her swallow ability appeared functional.  Skilled intervention included educating pt to aspiration precautions/swallow compensations - but uncertain if pt will retain information.  Recommend continue diet with general precautions and set up assist secondary to pt's right HP.     Aspiration Risk    moderate secondary to cognition - ? Visual hallucinations    Diet Recommendation Regular;Thin liquid   Liquid Administration via: Cup;Straw Medication Administration: Whole meds with puree Supervision: Patient able to self feed (needs set up) Compensations: Slow rate;Small sips/bites;Check for pocketing Postural Changes and/or Swallow Maneuvers: Seated upright 90 degrees;Upright 30-60 min after meal    Other  Recommendations Oral Care Recommendations: Oral care BID   Follow Up Recommendations  None    Frequency and Duration   n/a     Pertinent Vitals/Pain Afebrile, decreased  Swallow Study Prior Functional Status   see [place] Date of Onset: 02/13/14 HPI: 63 yo female resident of SNF adm to [facility] with HCAP.  Pt with PMH + for smoking, CVA with right HP, gastric ulcers.  CXR showed left more than right lung opacity.  Swallow evaluation ordered.  Type of Study: Bedside swallow evaluation Diet Prior to this Study: Regular;Thin liquids Temperature Spikes Noted: No Respiratory Status: Room air History of Recent Intubation: No Behavior/Cognition: Alert;Cooperative (pt confused, stating dots on wall moved when  pt was eating applesauce, redirected) Oral Cavity - Dentition: Dentures, top (pt with limited participation therefore limited oral motor exam) Self-Feeding Abilities: Able to feed self;Needs set up (needs set up due to right HP) Patient Positioning: Upright in bed Baseline Vocal Quality: Clear Volitional Cough: Strong Volitional Swallow: Unable to elicit    Oral/Motor/Sensory Function Overall Oral Motor/Sensory Function: Impaired at baseline (pt with h/o right hemiparesis)   Ice Chips Ice chips: Not tested   Thin Liquid Thin Liquid: Impaired Presentation: Straw;Self Fed;Cup Oral Phase Impairments: Reduced lingual movement/coordination Oral Phase Functional Implications: Other (comment) Pharyngeal  Phase Impairments: Cough - Immediate Other Comments: pt with overt coughing when talking while trying to swallow liquids - suspect aspiration, consumption of intake without talking eliminated symptoms of aspiration    Nectar Thick Nectar Thick Liquid: Not tested   Honey Thick Honey Thick Liquid: Not tested   Puree Puree: Within functional limits Presentation: Self Fed;Spoon Oral Phase Functional Implications: Prolonged oral transit Pharyngeal Phase Impairments: Multiple swallows   Solid   GO    Solid: Impaired Presentation: Self Fed Oral Phase Impairments: Reduced lingual movement/coordination;Impaired anterior to posterior transit Oral Phase Functional Implications: Right lateral sulci pocketing (pt effectively clears with further swallows)       Luanna Salk, [place] [facility] SLP [phone]

## 2014-02-13 NOTE — Progress Notes (Signed)
Notified patient's daughter that we triaged patient to the 3rd floor. Also, notified NP on call. Gave report to the RN getting patient and transferred her downstairs.

## 2014-02-14 ENCOUNTER — Inpatient Hospital Stay (HOSPITAL_COMMUNITY): Payer: Medicare Other

## 2014-02-14 LAB — BASIC METABOLIC PANEL
Anion gap: 15 (ref 5–15)
BUN: 22 mg/dL (ref 6–23)
CALCIUM: 10.3 mg/dL (ref 8.4–10.5)
CHLORIDE: 102 meq/L (ref 96–112)
CO2: 21 mEq/L (ref 19–32)
Creatinine, Ser: 0.75 mg/dL (ref 0.50–1.10)
GFR calc Af Amer: >90 mL/min (ref >90)
GFR calc non Af Amer: 89 mL/min — ABNORMAL LOW (ref >90)
Glucose, Bld: 141 mg/dL — ABNORMAL HIGH (ref 70–99)
Potassium: 4.2 mEq/L (ref 3.7–5.3)
SODIUM: 138 meq/L (ref 137–147)

## 2014-02-14 LAB — CBC
HCT: 41.7 % (ref 36.0–46.0)
HEMOGLOBIN: 13.5 g/dL (ref 12.0–15.0)
MCH: 29.7 pg (ref 26.0–34.0)
MCHC: 32.4 g/dL (ref 30.0–36.0)
MCV: 91.6 fL (ref 78.0–100.0)
PLATELETS: 225 10*3/uL (ref 150–400)
RBC: 4.55 MIL/uL (ref 3.87–5.11)
RDW: 13.9 % (ref 11.5–15.5)
WBC: 7.5 10*3/uL (ref 4.0–10.5)

## 2014-02-14 LAB — LEGIONELLA ANTIGEN, URINE

## 2014-02-14 LAB — GLUCOSE, CAPILLARY
GLUCOSE-CAPILLARY: 143 mg/dL — AB (ref 70–99)
GLUCOSE-CAPILLARY: 167 mg/dL — AB (ref 70–99)

## 2014-02-14 MED ORDER — LEVOFLOXACIN 500 MG PO TABS: 500.0000 mg | ORAL_TABLET | Freq: Every day | ORAL | Status: DC

## 2014-02-14 MED ORDER — CLONAZEPAM 0.5 MG PO TABS: 0.2500 mg | ORAL_TABLET | Freq: Two times a day (BID) | ORAL | Status: DC | PRN

## 2014-02-14 MED ORDER — TRAMADOL HCL 50 MG PO TABS: ORAL_TABLET | ORAL | Status: DC

## 2014-02-14 MED ORDER — LORAZEPAM 0.5 MG PO TABS: ORAL_TABLET | ORAL | Status: DC

## 2014-02-14 MED ORDER — HYDRALAZINE HCL 25 MG PO TABS
25.0000 mg | ORAL_TABLET | Freq: Three times a day (TID) | ORAL | Status: DC
Start: 2014-02-14 — End: 2014-02-14
  Administered 2014-02-14: 25 mg via ORAL
  Filled 2014-02-14 (×3): qty 1

## 2014-02-14 MED ORDER — ALBUTEROL SULFATE (2.5 MG/3ML) 0.083% IN NEBU: 2.5000 mg | INHALATION_SOLUTION | RESPIRATORY_TRACT | Status: AC | PRN

## 2014-02-14 MED ORDER — HYDRALAZINE HCL 25 MG PO TABS: 25.0000 mg | ORAL_TABLET | Freq: Three times a day (TID) | ORAL | Status: DC

## 2014-02-14 NOTE — Clinical Documentation Improvement (Signed)
Supporting Information: AMS noted per 12/13 progress notes.   Possible Diagnosis? . Document the etiology of the altered mental status as: --Confusion/delirium (including drug-induced) --Drowsiness/somnolence --Stupor/semi-coma --Encephalopathy Alcoholic Anoxic/hypoxic Drug-induced/toxic (specify drug) Hepatic Hypertensive Hypoglycemic Metabolic/septic Traumatic/post-concussion Wernicke Other (specify) . Document any associated diagnoses/conditions    Thank Sherian Maroon Documentation Specialist [phone] [email or web address]

## 2014-02-14 NOTE — Progress Notes (Signed)
Pt for discharge to [facility].   CSW facilitated pt discharge needs including contacting facility, faxing pt discharge information via [facility], discussing with pt and pt sister-in-law at bedside, discussing with pt daughter, Janett Billow via telephone, providing RN phone number to call report, and arranging ambulance transport via [facility] to [facility].  Pt daughter appreciative of update. CSW provided support as pt daughter discussed that she is concerned about pt confusion as that is not pt baseline and pt daughter plans to discuss with [facility] and ensure that [name] continues to monitor.   No further social work needs identified at this time.  CSW signing off.   Alison Murray, MSW, [place] Work [phone]

## 2014-02-14 NOTE — Care Management (Signed)
CARE MANAGEMENT NOTE 02/14/2014  Patient:  Chelsey Gallagher, Chelsey Gallagher   Account Number:  [id number]  Date Initiated:  02/14/2014  Documentation initiated by:  Marney Doctor  Subjective/Objective Assessment:   63 yo admitted with HCAP     Action/Plan:   From [facility] SNF   Anticipated DC Date:  02/14/2014   Anticipated DC Plan:  SKILLED NURSING FACILITY  In-house referral  Clinical Social Worker      DC Planning Services  CM consult      Choice offered to / List presented to:             Status of service:  Completed, signed off Medicare Important Message given?   (If response is "NO", the following Medicare IM given date fields will be blank) Date Medicare IM given:   Medicare IM given by:   Date Additional Medicare IM given:   Additional Medicare IM given by:    Discharge Disposition:    Per UR Regulation:  Reviewed for med. necessity/level of care/duration of stay  If discussed at [place] of Stay Meetings, dates discussed:    Comments:  02/14/14 Marney Doctor RN,BSN,NCM Pt to dc back to [facility] SNF today.  Chart reviewed and no CM needs noted.

## 2014-02-14 NOTE — Discharge Summary (Signed)
Physician Discharge Summary  Chelsey Gallagher:[id number] DOB: August 06, 1950 DOA: March 10, 2014  PCP: Chelsey Duos, MD  Admit date: 03/10/14 Discharge date: 02/14/2014  Recommendations for Outpatient Follow-up:  1. Pt will need to follow up with PCP in 2-3 weeks post discharge 2. Please obtain BMP to evaluate electrolytes and kidney function 3. Please also check CBC to evaluate Hg and Hct levels 4. Continue Levaquin for 7 more days post discharge   Discharge Diagnoses:  Active Problems:   HCAP (healthcare-associated pneumonia)   Discharge Condition: Stable  Diet recommendation: Heart healthy diet discussed in details   Brief narrative: Pt is 63 yo female with history of stroke, HTN, HLD, DM, currently resides at SNF, presented to [facility] ED with main concern of several days duration of progressively worsening exertional dyspnea, productive cough of yellow sputum, subjective fevers, chills, poor oral intake. Her dyspnea is now present at rest as well. She also reports dysuria and urinary urgency and frequency. Pt denies chest pain, no LE edema, no recent weight gain or weight loss. She denies specific focal neurological deficits.   In ED, pt is hemodynamically stable, VSS, UA suggestive of UTI, CXR with possible PNA. TRH asked to admit for further evaluation.  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 Stable cardiomegaly without evidence of edema/failure.  Medical Consultants:    None  Other Consultants:    Physical therapy  Anti-Infectives:    Vancomycin March 11, 2023 --> 12/15  Maxipime 03-11-2023 --> 12/15  Levaquin 12/15 --> 7 more days    Discharge Exam: Filed Vitals:   02/14/14 0524  BP: 184/91  Pulse: 106  Temp: 98.2 F (36.8 C)  Resp: 18   Filed Vitals:   02/13/14 1452 02/13/14 2019 02/13/14 2324 02/14/14 0524  BP: 213/86 155/65 152/76 184/91  Pulse: 108 117 113 106  Temp: 98.5 F (36.9 C) 97.5 F (36.4 C) 98.1 F (36.7 C) 98.2 F (36.8 C)  TempSrc: Oral Oral Oral Oral  Resp: 18 18 20 18   Height:    5\' 3"  (1.6 m)  Weight:    65.6 kg (144 lb 10 oz)  SpO2: 94% 94% 93% 95%    General: Pt is alert, follows commands appropriately, not in acute distress Cardiovascular: Regular rate and rhythm, S1/S2 +, no murmurs, no rubs, no gallops Respiratory: Clear to auscultation bilaterally, no wheezing, no crackles, no rhonchi Abdominal: Soft, non tender, non distended, bowel sounds +, no guarding  Discharge Instructions  Discharge  Instructions    Diet - low sodium heart healthy    Complete by:  As directed      Increase activity slowly    Complete by:  As directed             Medication List    TAKE these medications        acetaminophen 325 MG tablet  Commonly known as:  TYLENOL  Take 650 mg by mouth every 6 (six) hours as needed for pain.     albuterol (2.5 MG/3ML) 0.083% nebulizer solution  Commonly known as:   PROVENTIL  Take 3 mLs (2.5 mg total) by nebulization every 4 (four) hours as needed for wheezing or shortness of breath.     aspirin 81 MG chewable tablet  Chew 81 mg by mouth daily.     baclofen 20 MG tablet  Commonly known as:  LIORESAL  Take 20 mg by mouth at bedtime.     baclofen 10 MG tablet  Commonly known as:  LIORESAL  Take 5 mg by mouth 2 (two) times daily. 5 mg at 8a and 4pm     BENEFIBER Powd  Take 1 Applicatorful by mouth daily. Mix with 8oz of fluid     cholecalciferol 1000 UNITS tablet  Commonly known as:  VITAMIN D  Take 1,000 Units by mouth daily.     clonazePAM 0.5 MG tablet  Commonly known as:  KLONOPIN  Take 0.5 tablets (0.25 mg total) by mouth 2 (two) times daily as needed for anxiety.     clopidogrel 75 MG tablet  Commonly known as:  PLAVIX  Take 75 mg by mouth daily.     DULoxetine 30 MG capsule  Commonly known as:  CYMBALTA  Take 60 mg by mouth daily.     ferrous sulfate 325 (65 FE) MG tablet  Take 325 mg by mouth 3 (three) times daily.     gabapentin 300 MG capsule  Commonly known as:  NEURONTIN  Take 300 mg by mouth at bedtime.     hydrALAZINE 25 MG tablet  Commonly known as:  APRESOLINE  Take 1 tablet (25 mg total) by mouth every 8 (eight) hours.     insulin glargine 100 UNIT/ML injection  Commonly known as:  LANTUS  Inject 12 Units into the skin at bedtime.     levofloxacin 500 MG tablet  Commonly known as:  LEVAQUIN  Take 1 tablet (500 mg total) by mouth daily.     LORazepam 0.5 MG tablet  Commonly known as:  ATIVAN  Take 1/2 tablet by mouth every morning at 9am; Take one tablet by mouth every evening at 9pm     lubiprostone 24 MCG capsule  Commonly known as:  AMITIZA  Take 24 mcg by mouth 2 (two) times daily with a meal.     magnesium hydroxide 800 MG/5ML suspension  Commonly known as:  MILK OF MAGNESIA  Take 30 mLs by mouth daily as needed for constipation. 30cc     Menthol (Topical Analgesic) 4 % Gel  Commonly known as:   BIOFREEZE  To right ankle and bilateral knees TID     menthol-cetylpyridinium 3 MG lozenge  Commonly known as:  CEPACOL  Take 1 lozenge by mouth every 4 (four) hours as needed for sore throat.     metFORMIN 500 MG tablet  Commonly known as:  GLUCOPHAGE  Take 1,000 mg by mouth 2 (two) times daily with a meal.     methocarbamol 500 MG tablet  Commonly known as:  ROBAXIN  Take 1 tablet (500 mg total) by mouth at bedtime.     midodrine 5 MG tablet  Commonly known as:  PROAMATINE  Take 5 mg by mouth 3 (three) times daily.     mirabegron ER 25 MG Tb24 tablet  Commonly known as:  MYRBETRIQ  Take 1 tablet (25 mg total) by mouth daily.     polyethylene glycol packet  Commonly known as:  MIRALAX / GLYCOLAX  Take 17 g by mouth daily as needed for mild constipation.     pravastatin 40 MG tablet  Commonly known as:  PRAVACHOL  Take 40 mg by mouth daily.     traMADol 50 MG tablet  Commonly known as:  ULTRAM  Take two tablets by mouth every 8 hours as needed for pain           Follow-up Information    Follow up with Chelsey Duos, MD.   Specialty:  Internal Medicine   Contact information:   [place] [id number] [phone]        The results of significant diagnostics from this hospitalization (including imaging, microbiology, ancillary and laboratory) are listed below for reference.     Microbiology: Recent Results (from the past 240 hour(s))  Urine culture     Status: None   Collection Time: 02/12/14  6:59 AM  Result Value Ref Range Status   Specimen Description URINE, CLEAN CATCH  Final   Special Requests Normal  Final   Culture  Setup Time   Final    02/12/2014 11:19 Performed at [place] Performed at [facility]   Final   Culture NO GROWTH Performed at [facility]   Final   Report Status 02/13/2014 FINAL  Final  MRSA PCR Screening     Status: None   Collection Time: 02/12/14   3:11 PM  Result Value Ref Range Status   MRSA by PCR NEGATIVE NEGATIVE Final    Comment:        The GeneXpert MRSA Assay (FDA approved for NASAL specimens only), is one component of a comprehensive MRSA colonization surveillance program. It is not intended to diagnose MRSA infection nor to guide or monitor treatment for MRSA infections.   Culture, blood (routine x 2) Call MD if unable to obtain prior to antibiotics being given     Status: None (Preliminary result)   Collection Time: 02/12/14  3:30 PM  Result Value Ref Range Status   Specimen Description BLOOD RIGHT HAND  Final   Special Requests   Final    BOTTLES DRAWN AEROBIC AND ANAEROBIC 10CC BOTH BOTTLES   Culture  Setup Time   Final    02/12/2014 21:03 Performed at [facility]    Culture   Final           BLOOD CULTURE RECEIVED NO GROWTH TO DATE CULTURE WILL BE HELD FOR 5 DAYS BEFORE ISSUING A FINAL NEGATIVE REPORT Performed at [facility]    Report Status PENDING  Incomplete  Culture, blood (routine x 2) Call MD if unable to obtain prior to antibiotics being given     Status: None (Preliminary result)   Collection Time: 02/12/14  3:38 PM  Result Value Ref Range Status   Specimen Description BLOOD LEFT HAND  Final   Special Requests   Final    BOTTLES DRAWN AEROBIC AND ANAEROBIC 10CC BOTH BOTTLES  Culture  Setup Time   Final    02/12/2014 21:03 Performed at [facility]    Culture   Final           BLOOD CULTURE RECEIVED NO GROWTH TO DATE CULTURE WILL BE HELD FOR 5 DAYS BEFORE ISSUING A FINAL NEGATIVE REPORT Performed at [facility]    Report Status PENDING  Incomplete     Labs: Basic Metabolic Panel:  Recent Labs Lab 02/12/14 0653 02/12/14 1537 02/13/14 0419 02/14/14 0515  NA 137 139 139 138  K 5.6* 5.3 4.6 4.2  CL 98 103 104 102  CO2 23 25 22 21   GLUCOSE 156* 128* 124* 141*  BUN 37* 36* 29* 22  CREATININE 1.29* 1.14* 0.93 0.75  CALCIUM 9.8 9.7 9.7 10.3   Liver  Function Tests:  Recent Labs Lab 02/12/14 0653  AST 33  ALT 31  ALKPHOS 79  BILITOT 0.5  PROT 7.5  ALBUMIN 3.6   No results for input(s): LIPASE, AMYLASE in the last 168 hours. No results for input(s): AMMONIA in the last 168 hours. CBC:  Recent Labs Lab 02/12/14 0730 02/13/14 0419 02/14/14 0515  WBC 10.1 7.2 7.5  HGB 12.6 11.8* 13.5  HCT 39.2 36.4 41.7  MCV 94.2 92.6 91.6  PLT 175 173 225   Cardiac Enzymes: No results for input(s): CKTOTAL, CKMB, CKMBINDEX, TROPONINI in the last 168 hours. BNP: BNP (last 3 results) No results for input(s): PROBNP in the last 8760 hours. CBG:  Recent Labs Lab 02/12/14 2141 02/13/14 0754 02/13/14 1237 02/13/14 1639 02/13/14 2329  GLUCAP 199* 135* 130* 191* 203*     SIGNED: Time coordinating discharge: Over 30 minutes  MAGICK-Arnola Crittendon, MD  [facility] 02/14/2014, 12:51 PM Pager [phone]  If 7PM-7AM, please contact night-coverage [email or web address] Password [email or web address]

## 2014-02-14 NOTE — Discharge Instructions (Signed)

## 2014-02-14 NOTE — Progress Notes (Addendum)
Clinical Social Work Department BRIEF PSYCHOSOCIAL ASSESSMENT 02/14/2014  Patient:  Chelsey Gallagher, Chelsey Gallagher     Account Number:  [id number]     Admit date:  02/12/2014  Clinical Social Worker:  Maryln Manuel  Date/Time:  02/14/2014 11:30 AM  Referred by:  Physician  Date Referred:  02/14/2014 Referred for  SNF Placement   Other Referral:   Interview type:  Patient Other interview type:   and patient sister-in-law at bedside    PSYCHOSOCIAL DATA Living Status:  FACILITY Admitted from facility:  [place] Level of care:  [place] Primary support name:  Janett Billow P[place]/daughter/[phone] Primary support relationship to patient:  CHILD, ADULT Degree of support available:   adequate    CURRENT CONCERNS Current Concerns  Post-Acute Placement   Other Concerns:    SOCIAL WORK ASSESSMENT / PLAN CSW received referral that pt admitted from [facility].    CSW visited pt room. Pt sister-in-law present at bedside. Pt provided permission for CSW to discuss pt in front of pt sister-in-law. Pt sister-in-law shared that pt daughter is pt primary contact. Pt and pt sister-in-law confirmed that pt is a resident at [facility] and [place]. Pt confirmed that she plans to return to [facility] when medically stable for discharge. Per pt sister-in-law, MD plans for pt to have MRI today.    CSW completed FL2 and contacted [facility] who confirmed that they can accept pt when pt medically stable for discharge.    CSW to continue to follow and assist with pt transition back to [place] when pt medically ready for discharge.   Assessment/plan status:  Psychosocial Support/Ongoing Assessment of Needs Other assessment/ plan:   discharge planning   Information/referral to community resources:   Referral back to [facility]    PATIENT'S/FAMILY'S RESPONSE TO PLAN OF CARE: Pt alert and oriented x 3.  Pt withdrawn, but responded to direct questioning.  Pt sister-in-law supportive and states that she will update pt daughter regarding pt progress. CSW to continue to follow.   Alison Murray, MSW, [place] Work [phone]

## 2014-02-14 NOTE — Progress Notes (Signed)
PT Cancellation Note  Patient Details Name: Chelsey Gallagher MRN: [id number] DOB: 06-24-1950   Cancelled Treatment:    Reason Eval/Treat Not Completed: Patient at procedure or test/unavailable (pt being transported to MRI. Will check back another time as schedule permits. )   Weston Anna, MPT Pager: [phone]

## 2014-02-14 NOTE — Progress Notes (Signed)
PT Cancellation Note  Patient Details Name: Chelsey Gallagher MRN: [id number] DOB: 15-Jun-1950   Cancelled Treatment:    Reason Eval/Treat Not Completed: pt set to return to SNF. Will defer PT eval to SNF.   Weston Anna, MPT Pager: [phone]

## 2014-02-15 ENCOUNTER — Non-Acute Institutional Stay (SKILLED_NURSING_FACILITY): Payer: Medicare Other | Admitting: Internal Medicine

## 2014-02-15 DIAGNOSIS — I639 Cerebral infarction, unspecified: Secondary | ICD-10-CM

## 2014-02-15 DIAGNOSIS — N3000 Acute cystitis without hematuria: Secondary | ICD-10-CM

## 2014-02-15 DIAGNOSIS — J9601 Acute respiratory failure with hypoxia: Secondary | ICD-10-CM | POA: Diagnosis not present

## 2014-02-15 DIAGNOSIS — I10 Essential (primary) hypertension: Secondary | ICD-10-CM | POA: Diagnosis not present

## 2014-02-15 DIAGNOSIS — E1149 Type 2 diabetes mellitus with other diabetic neurological complication: Secondary | ICD-10-CM

## 2014-02-15 DIAGNOSIS — J189 Pneumonia, unspecified organism: Secondary | ICD-10-CM

## 2014-02-15 DIAGNOSIS — E1140 Type 2 diabetes mellitus with diabetic neuropathy, unspecified: Secondary | ICD-10-CM | POA: Diagnosis not present

## 2014-02-15 DIAGNOSIS — I6350 Cerebral infarction due to unspecified occlusion or stenosis of unspecified cerebral artery: Secondary | ICD-10-CM

## 2014-02-15 DIAGNOSIS — G8110 Spastic hemiplegia affecting unspecified side: Secondary | ICD-10-CM

## 2014-02-15 DIAGNOSIS — F329 Major depressive disorder, single episode, unspecified: Secondary | ICD-10-CM

## 2014-02-15 DIAGNOSIS — E785 Hyperlipidemia, unspecified: Secondary | ICD-10-CM

## 2014-02-15 DIAGNOSIS — F32A Depression, unspecified: Secondary | ICD-10-CM

## 2014-02-15 LAB — GLUCOSE, CAPILLARY: Glucose-Capillary: 175 mg/dL — ABNORMAL HIGH (ref 70–99)

## 2014-02-15 NOTE — Progress Notes (Signed)
MRN: [id number] Name: Chelsey Gallagher  Sex: female Age: 63 y.o. DOB: 1950-05-27  [place] #: [name] Facility/Room:104 Level Of Care: SNF Provider: Inocencio Homes D Emergency Contacts: Extended Emergency Contact Information Primary Emergency Contact: Devoria Albe [place] Phone: [phone] Mobile Phone: [phone] Relation: Daughter  Code Status: FULL  Allergies: Codeine; Flexeril; and Lyrica  Chief Complaint  Patient presents with  . New Admit To SNF    HPI: Patient is 63 y.o. 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  Past Medical History  Diagnosis Date  . Diabetes mellitus   . Hypertension   . PAD (peripheral artery disease)     S/p bypass grafting, unclear exactly where  . Nephrolithiasis   . Hyperlipidemia 05/14/2011  . Hearing loss   . Complication of anesthesia     doesn't wake up good- "usually end up in ICU" also experiences post op nausea and vomiting  . Orthostatic hypotension     after surgery.  Marland Kitchen Heart murmur     PCP- [facility]- No tx needed.  . Stroke 05/2011    Weakness on Right. Wears leg brace.  . Foot drop, left   . H/O hiatal hernia     second one  . Diabetic neuropathy     Diabetic neuropathy- has inproved since she quit smoking.  . Arthritis   . Depression     Past Surgical History  Procedure Laterality Date  . Total hip arthroplasty  2008    Right  . Abdominal hysterectomy  2003  . Cesarean section  1984  . Cholecystectomy  1984  . Bypass graft  2003    Abdominal aortic  . Kidney stone surgery  1999  . Appendectomy  2011  . Hernia repair  2011  . Lithotripsy    . Abdominal hysterectomy    . Esophagogastroduodenoscopy N/A 05/09/2013    Procedure: ESOPHAGOGASTRODUODENOSCOPY (EGD);  Surgeon: Lafayette Dragon, MD;  Location: [facility] ENDOSCOPY;  Service: Endoscopy;  Laterality: N/A;  . Colonoscopy N/A 05/10/2013    Procedure: COLONOSCOPY;   Surgeon: Lafayette Dragon, MD;  Location: [facility] ENDOSCOPY;  Service: Endoscopy;  Laterality: N/A;      Medication List       This list is accurate as of: 02/15/14 11:59 PM.  Always use your most recent med list.               acetaminophen 325 MG tablet  Commonly known as:  TYLENOL  Take 650 mg by mouth every 6 (six) hours as needed for pain.     albuterol (2.5 MG/3ML) 0.083% nebulizer solution  Commonly known as:  PROVENTIL  Take 3 mLs (2.5 mg total) by nebulization every 4 (four) hours as needed for wheezing or shortness of breath.     aspirin 81 MG chewable tablet  Chew 81 mg by mouth daily.     baclofen 20 MG tablet  Commonly known as:  LIORESAL  Take 20 mg by mouth at bedtime.     baclofen 10 MG tablet  Commonly known as:  LIORESAL  Take 5 mg by mouth 2 (two) times daily. 5 mg at 8a and 4pm     BENEFIBER Powd  Take 1 Applicatorful by mouth daily. Mix with 8oz of fluid     cholecalciferol 1000 UNITS tablet  Commonly known as:  VITAMIN D  Take 1,000 Units by mouth daily.     clonazePAM 0.5  MG tablet  Commonly known as:  KLONOPIN  Take 0.5 tablets (0.25 mg total) by mouth 2 (two) times daily as needed for anxiety.     clopidogrel 75 MG tablet  Commonly known as:  PLAVIX  Take 75 mg by mouth daily.     DULoxetine 30 MG capsule  Commonly known as:  CYMBALTA  Take 60 mg by mouth daily.     ferrous sulfate 325 (65 FE) MG tablet  Take 325 mg by mouth 3 (three) times daily.     gabapentin 300 MG capsule  Commonly known as:  NEURONTIN  Take 300 mg by mouth at bedtime.     hydrALAZINE 25 MG tablet  Commonly known as:  APRESOLINE  Take 1 tablet (25 mg total) by mouth every 8 (eight) hours.     insulin glargine 100 UNIT/ML injection  Commonly known as:  LANTUS  Inject 12 Units into the skin at bedtime.     levofloxacin 500 MG tablet  Commonly known as:  LEVAQUIN  Take 1 tablet (500 mg total) by mouth daily.     LORazepam 0.5 MG tablet  Commonly known as:   ATIVAN  Take 1/2 tablet by mouth every morning at 9am; Take one tablet by mouth every evening at 9pm     lubiprostone 24 MCG capsule  Commonly known as:  AMITIZA  Take 24 mcg by mouth 2 (two) times daily with a meal.     magnesium hydroxide 800 MG/5ML suspension  Commonly known as:  MILK OF MAGNESIA  Take 30 mLs by mouth daily as needed for constipation. 30cc     Menthol (Topical Analgesic) 4 % Gel  Commonly known as:  BIOFREEZE  To right ankle and bilateral knees TID     menthol-cetylpyridinium 3 MG lozenge  Commonly known as:  CEPACOL  Take 1 lozenge by mouth every 4 (four) hours as needed for sore throat.     metFORMIN 500 MG tablet  Commonly known as:  GLUCOPHAGE  Take 1,000 mg by mouth 2 (two) times daily with a meal.     methocarbamol 500 MG tablet  Commonly known as:  ROBAXIN  Take 1 tablet (500 mg total) by mouth at bedtime.     midodrine 5 MG tablet  Commonly known as:  PROAMATINE  Take 5 mg by mouth 3 (three) times daily.     mirabegron ER 25 MG Tb24 tablet  Commonly known as:  MYRBETRIQ  Take 1 tablet (25 mg total) by mouth daily.     polyethylene glycol packet  Commonly known as:  MIRALAX / GLYCOLAX  Take 17 g by mouth daily as needed for mild constipation.     pravastatin 40 MG tablet  Commonly known as:  PRAVACHOL  Take 40 mg by mouth daily.     traMADol 50 MG tablet  Commonly known as:  ULTRAM  Take two tablets by mouth every 8 hours as needed for pain        No orders of the defined types were placed in this encounter.    Immunization History  Administered Date(s) Administered  . Influenza-Unspecified 12/04/2011  . Pneumococcal Polysaccharide-23 09/03/2011  . Pneumococcal-Unspecified 12/02/2010    History  Substance Use Topics  . Smoking status: Former Smoker -- 0.00 packs/day for 45 years    Types: Cigarettes  . Smokeless tobacco: Former Systems developer    Quit date: 05/08/2011  . Alcohol Use: No    Family history is noncontributory     Review of  Systems  DATA OBTAINED: from patient GENERAL:  no fevers,+ fatigue, appetite changes SKIN: No itching, rash or wounds EYES: No eye pain, redness, discharge EARS: No earache, tinnitus, change in hearing NOSE: No congestion, drainage or bleeding  MOUTH/THROAT: No mouth or tooth pain, No sore throat RESPIRATORY: still cough but better,no wheezing, SOB CARDIAC: No chest pain, palpitations, lower extremity edema  GI: No abdominal pain, No N/V/D or constipation, No heartburn or reflux  GU: No dysuria, frequency or urgency, or incontinence  MUSCULOSKELETAL: No unrelieved bone/joint pain NEUROLOGIC: No headache, dizziness PSYCHIATRIC: No overt anxiety or sadness, No behavior issue.   Filed Vitals:   02/15/14 1756  BP: 167/85  Pulse: 94  Temp: 98.1 F (36.7 C)  Resp: 20    Physical Exam  GENERAL APPEARANCE: Alert, conversant,  No acute distress.  SKIN: No diaphoresis  HEAD: Normocephalic, atraumatic  EYES: Conjunctiva/lids clear. Pupils round, reactive. EOMs intact.  EARS: External exam WNL, canals clear. Hearing grossly normal.  NOSE: No deformity or discharge.  MOUTH/THROAT: Lips w/o lesions  RESPIRATORY: Breathing is even, unlabored. Lung sounds are full with some rhonchi   CARDIOVASCULAR: Heart RRR no murmurs, rubs or gallops. No peripheral edema.   GASTROINTESTINAL: Abdomen is soft, non-tender, not distended w/ normal bowel sounds. GENITOURINARY: Bladder non tender, not distended  MUSCULOSKELETAL: contractures NEUROLOGIC:  Cranial nerves 2-12 grossly intact; L side weakness PSYCHIATRIC: Mood and affect appropriate to situation, no behavioral issues  Patient Active Problem List   Diagnosis Date Noted  . Acute respiratory failure 02/17/2014  . HCAP (healthcare-associated pneumonia) 02/12/2014  . Hypoxia   . Generalized anxiety disorder 08/26/2013  . Late effects of CVA (cerebrovascular accident) 07/08/2013  . Depression   . Hypotension 05/08/2013  . Fecal  occult blood test positive 05/08/2013  . Altered mental status 05/08/2013  . Insomnia 04/26/2013  . UTI (urinary tract infection) 07/02/2012  . Anemia, iron deficiency 07/02/2012  . Ingrown toenail 07/02/2012  . Constipation 07/02/2012  . 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 Cerebral artery occlusion with cerebral infarction 04/13/2012  . Orthostatic hypotension 04/06/2012  . Abnormal TSH 04/06/2012  . Spastic hemiplegia affecting dominant side 07/25/2011  . Stroke 05/15/2011  . Hyperlipidemia 05/14/2011  . Tobacco abuse 05/14/2011  . 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 Lung abnormality 05/11/2011  . CVA (cerebral infarction) 05/08/2011  . Hypertension   . DM (diabetes mellitus), type 2 with neurological complications     CBC    Component Value Date/Time   WBC 7.5 02/14/2014 0515   RBC 4.55 02/14/2014 0515   RBC 2.99* 04/05/2012 0345   HGB 13.5 02/14/2014 0515   HCT 41.7 02/14/2014 0515   PLT 225 02/14/2014 0515   MCV 91.6 02/14/2014 0515   LYMPHSABS 2.0 05/07/2013 2223   MONOABS 0.6 05/07/2013 2223   EOSABS 0.1 05/07/2013 2223   BASOSABS 0.0 05/07/2013 2223    CMP     Component Value Date/Time   NA 138 02/14/2014 0515   K 4.2 02/14/2014 0515   CL 102 02/14/2014 0515   CO2 21 02/14/2014 0515   GLUCOSE 141* 02/14/2014 0515   BUN 22 02/14/2014 0515   CREATININE 0.75 02/14/2014 0515   CALCIUM 10.3 02/14/2014 0515   PROT 7.5 02/12/2014 0653   ALBUMIN 3.6 02/12/2014 0653   AST 33 02/12/2014 0653   ALT 31 02/12/2014 0653   ALKPHOS 79 02/12/2014 0653   BILITOT 0.5 02/12/2014 0653   GFRNONAA 89* 02/14/2014 0515  GFRAA >90 02/14/2014 0515    Assessment and Plan  Acute respiratory failure appears to be secondary to developing PNA - since pt is resident of SNF, was started on broad spectrum ABX Vancomycin and Maxipime  - sputum culture, urine legionella and strep pneumo negative - respiratory status stable  -  provide BD's as needed - pt clinically improving  - transition to oral Levaquin upon discharge for 7 more days   HCAP (healthcare-associated pneumonia) Levaquin for 7 days post discharge  UTI (urinary tract infection) suggestive based on UA and pt's symptoms - current ABX should be adequate in coverage  - urine culture negative   DM (diabetes mellitus), type 2 with neurological complications continue Lantus and metformin upon discharge as per home medical regimen ; continue neurontin  Hyperlipidemia Was held in hospital, now restarted  Spastic hemiplegia affecting dominant side R side weakness with contractures-cont baclofen and robaxin  Cerebral artery occlusion with cerebral infarction Pt on plavix,  ASA for prophylaxis and statin  Hypertension Hydralazine 25 mg TID;may need to increase. Pt gets orthostatic as well  Depression Cymbalta 60 mg daily to continue;depression is situational and appears to be improving some    Hennie Duos, MD

## 2014-02-16 DIAGNOSIS — M6281 Muscle weakness (generalized): Secondary | ICD-10-CM | POA: Diagnosis not present

## 2014-02-17 ENCOUNTER — Encounter: Payer: Self-pay | Admitting: Internal Medicine

## 2014-02-17 DIAGNOSIS — M6281 Muscle weakness (generalized): Secondary | ICD-10-CM | POA: Diagnosis not present

## 2014-02-17 DIAGNOSIS — J9600 Acute respiratory failure, unspecified whether with hypoxia or hypercapnia: Secondary | ICD-10-CM | POA: Insufficient documentation

## 2014-02-17 NOTE — Assessment & Plan Note (Signed)
Cymbalta 60 mg daily to continue;depression is situational and appears to be improving some

## 2014-02-17 NOTE — Assessment & Plan Note (Addendum)
continue Lantus and metformin upon discharge as per home medical regimen ; continue neurontin

## 2014-02-17 NOTE — Assessment & Plan Note (Signed)
R side weakness with contractures-cont baclofen and robaxin

## 2014-02-17 NOTE — Assessment & Plan Note (Signed)
suggestive based on UA and pt's symptoms - current ABX should be adequate in coverage  - urine culture negative

## 2014-02-17 NOTE — Assessment & Plan Note (Signed)
appears to be secondary to developing PNA - since pt is resident of SNF, was started on broad spectrum ABX Vancomycin and Maxipime  - sputum culture, urine legionella and strep pneumo negative - respiratory status stable  - provide BD's as needed - pt clinically improving  - transition to oral Levaquin upon discharge for 7 more days

## 2014-02-17 NOTE — Assessment & Plan Note (Signed)
Pt on plavix,  ASA for prophylaxis and statin

## 2014-02-17 NOTE — Assessment & Plan Note (Signed)
Was held in hospital, now restarted

## 2014-02-17 NOTE — Assessment & Plan Note (Signed)
Levaquin for 7 days post discharge

## 2014-02-17 NOTE — Assessment & Plan Note (Signed)
Hydralazine 25 mg TID;may need to increase. Pt gets orthostatic as well

## 2014-02-18 LAB — CULTURE, BLOOD (ROUTINE X 2)
CULTURE: NO GROWTH
Culture: NO GROWTH

## 2014-02-19 DIAGNOSIS — M6281 Muscle weakness (generalized): Secondary | ICD-10-CM | POA: Diagnosis not present

## 2014-02-20 DIAGNOSIS — M6281 Muscle weakness (generalized): Secondary | ICD-10-CM | POA: Diagnosis not present

## 2014-02-21 DIAGNOSIS — M6281 Muscle weakness (generalized): Secondary | ICD-10-CM | POA: Diagnosis not present

## 2014-02-22 DIAGNOSIS — M6281 Muscle weakness (generalized): Secondary | ICD-10-CM | POA: Diagnosis not present

## 2014-02-27 DIAGNOSIS — M1812 Unilateral primary osteoarthritis of first carpometacarpal joint, left hand: Secondary | ICD-10-CM | POA: Diagnosis not present

## 2014-02-28 DIAGNOSIS — M6281 Muscle weakness (generalized): Secondary | ICD-10-CM | POA: Diagnosis not present

## 2014-03-01 ENCOUNTER — Other Ambulatory Visit: Payer: Self-pay | Admitting: *Deleted

## 2014-03-01 DIAGNOSIS — M6281 Muscle weakness (generalized): Secondary | ICD-10-CM | POA: Diagnosis not present

## 2014-03-01 MED ORDER — TRAMADOL HCL 50 MG PO TABS: ORAL_TABLET | ORAL | Status: DC

## 2014-03-01 NOTE — Telephone Encounter (Signed)
[facility] 

## 2014-03-02 DIAGNOSIS — M6281 Muscle weakness (generalized): Secondary | ICD-10-CM | POA: Diagnosis not present

## 2014-03-03 DIAGNOSIS — M6281 Muscle weakness (generalized): Secondary | ICD-10-CM | POA: Diagnosis not present

## 2014-03-06 DIAGNOSIS — M6281 Muscle weakness (generalized): Secondary | ICD-10-CM | POA: Diagnosis not present

## 2014-03-07 DIAGNOSIS — M6281 Muscle weakness (generalized): Secondary | ICD-10-CM | POA: Diagnosis not present

## 2014-03-07 DIAGNOSIS — N3941 Urge incontinence: Secondary | ICD-10-CM | POA: Diagnosis not present

## 2014-03-07 DIAGNOSIS — N319 Neuromuscular dysfunction of bladder, unspecified: Secondary | ICD-10-CM | POA: Diagnosis not present

## 2014-03-08 DIAGNOSIS — F329 Major depressive disorder, single episode, unspecified: Secondary | ICD-10-CM | POA: Diagnosis not present

## 2014-03-08 DIAGNOSIS — M6281 Muscle weakness (generalized): Secondary | ICD-10-CM | POA: Diagnosis not present

## 2014-03-08 DIAGNOSIS — F411 Generalized anxiety disorder: Secondary | ICD-10-CM | POA: Diagnosis not present

## 2014-03-14 ENCOUNTER — Non-Acute Institutional Stay (SKILLED_NURSING_FACILITY): Payer: Medicare Other | Admitting: Nurse Practitioner

## 2014-03-14 DIAGNOSIS — F329 Major depressive disorder, single episode, unspecified: Secondary | ICD-10-CM

## 2014-03-14 DIAGNOSIS — D509 Iron deficiency anemia, unspecified: Secondary | ICD-10-CM | POA: Diagnosis not present

## 2014-03-14 DIAGNOSIS — N3941 Urge incontinence: Secondary | ICD-10-CM | POA: Diagnosis not present

## 2014-03-14 DIAGNOSIS — E1140 Type 2 diabetes mellitus with diabetic neuropathy, unspecified: Secondary | ICD-10-CM

## 2014-03-14 DIAGNOSIS — N3281 Overactive bladder: Secondary | ICD-10-CM | POA: Diagnosis not present

## 2014-03-14 DIAGNOSIS — K5900 Constipation, unspecified: Secondary | ICD-10-CM

## 2014-03-14 DIAGNOSIS — F32A Depression, unspecified: Secondary | ICD-10-CM

## 2014-03-14 DIAGNOSIS — D649 Anemia, unspecified: Secondary | ICD-10-CM | POA: Diagnosis not present

## 2014-03-14 DIAGNOSIS — I6990 Unspecified sequelae of unspecified cerebrovascular disease: Secondary | ICD-10-CM

## 2014-03-14 DIAGNOSIS — R300 Dysuria: Secondary | ICD-10-CM | POA: Diagnosis not present

## 2014-03-14 DIAGNOSIS — N319 Neuromuscular dysfunction of bladder, unspecified: Secondary | ICD-10-CM | POA: Diagnosis not present

## 2014-03-14 DIAGNOSIS — E1149 Type 2 diabetes mellitus with other diabetic neurological complication: Secondary | ICD-10-CM

## 2014-03-14 DIAGNOSIS — I10 Essential (primary) hypertension: Secondary | ICD-10-CM | POA: Diagnosis not present

## 2014-03-14 NOTE — Progress Notes (Signed)
Patient ID: Chelsey Gallagher, female   DOB: Nov 26, 1950, 64 y.o.   MRN: [id number]    Nursing Home Location:  [place] of Service: SNF (31)  PCP: Hennie Duos, MD  Allergies  Allergen Reactions  . Codeine Other (See Comments)    Per Mar  . Flexeril [Cyclobenzaprine Hcl] Other (See Comments)    Per Mar  . Lyrica [Pregabalin] Other (See Comments)    "wires my up"    Chief Complaint  Patient presents with  . Medical Management of Chronic Issues    HPI:  Patient is a 64 y.o. female seen today at [facility], seen today for routine follow up. She has a  pmh of CVA with spastic hemiplegia, constipation, anxiety, DM, HTN, and hyperlipidemia. Pt reports her mood is better after the holidays. Followed up with urology yesterday and she is trailing medications to see which medication works better. 2 weeks on myrbetriq and 2 weeks on vesicare. Pt reports worsening burning with urination. Did not mention this during urology visit. Reports worsening constipation  Review of Systems:  Review of Systems  Constitutional: Negative for activity change, appetite change, fatigue and unexpected weight change.  HENT: Negative for congestion and hearing loss.   Eyes: Negative.   Respiratory: Negative for cough and shortness of breath.   Cardiovascular: Negative for chest pain, palpitations and leg swelling.  Gastrointestinal: Negative for abdominal pain, diarrhea and constipation.  Genitourinary: Positive for dysuria. Negative for difficulty urinating.  Musculoskeletal: Negative for arthralgias.  Skin: Negative for color change and wound.  Neurological: Negative for dizziness and weakness.  Psychiatric/Behavioral: Negative for behavioral problems, confusion and agitation.       Depression, following with psych    Past Medical History  Diagnosis Date  . Diabetes mellitus   . Hypertension   . PAD (peripheral artery disease)     S/p bypass grafting,  unclear exactly where  . Nephrolithiasis   . Hyperlipidemia 05/14/2011  . Hearing loss   . Complication of anesthesia     doesn't wake up good- "usually end up in ICU" also experiences post op nausea and vomiting  . Orthostatic hypotension     after surgery.  Marland Kitchen Heart murmur     PCP- [facility]- No tx needed.  . Stroke 05/2011    Weakness on Right. Wears leg brace.  . Foot drop, left   . H/O hiatal hernia     second one  . Diabetic neuropathy     Diabetic neuropathy- has inproved since she quit smoking.  . Arthritis   . Depression    Past Surgical History  Procedure Laterality Date  . Total hip arthroplasty  2008    Right  . Abdominal hysterectomy  2003  . Cesarean section  1984  . Cholecystectomy  1984  . Bypass graft  2003    Abdominal aortic  . Kidney stone surgery  1999  . Appendectomy  2011  . Hernia repair  2011  . Lithotripsy    . Abdominal hysterectomy    . Esophagogastroduodenoscopy N/A 05/09/2013    Procedure: ESOPHAGOGASTRODUODENOSCOPY (EGD);  Surgeon: Lafayette Dragon, MD;  Location: [facility] ENDOSCOPY;  Service: Endoscopy;  Laterality: N/A;  . Colonoscopy N/A 05/10/2013    Procedure: COLONOSCOPY;  Surgeon: Lafayette Dragon, MD;  Location: [facility] ENDOSCOPY;  Service: Endoscopy;  Laterality: N/A;   Social History:   reports that she has quit smoking. Her smoking use included Cigarettes. She smoked  0.00 packs per day for 45 years. She quit smokeless tobacco use about 2 years ago. She reports that she does not drink alcohol or use illicit drugs.  Family History  Problem Relation Age of Onset  . Cancer Maternal Aunt     breast    Medications: Patient's Medications  New Prescriptions   No medications on file  Previous Medications   ACETAMINOPHEN (TYLENOL) 325 MG TABLET    Take 650 mg by mouth every 6 (six) hours as needed for pain.   ALBUTEROL (PROVENTIL) (2.5 MG/3ML) 0.083% NEBULIZER SOLUTION    Take 3 mLs (2.5 mg total) by nebulization every 4 (four) hours as  needed for wheezing or shortness of breath.   ASPIRIN 81 MG CHEWABLE TABLET    Chew 81 mg by mouth daily.   BACLOFEN (LIORESAL) 10 MG TABLET    Take 5 mg by mouth 2 (two) times daily. 5 mg at 8a and 4pm   BACLOFEN (LIORESAL) 20 MG TABLET    Take 20 mg by mouth at bedtime.   CHOLECALCIFEROL (VITAMIN D) 1000 UNITS TABLET    Take 1,000 Units by mouth daily.   CLONAZEPAM (KLONOPIN) 0.5 MG TABLET    Take 0.5 tablets (0.25 mg total) by mouth 2 (two) times daily as needed for anxiety.   CLOPIDOGREL (PLAVIX) 75 MG TABLET    Take 75 mg by mouth daily.   DULOXETINE (CYMBALTA) 30 MG CAPSULE    Take 60 mg by mouth daily.   FERROUS SULFATE 325 (65 FE) MG TABLET    Take 325 mg by mouth 3 (three) times daily.    GABAPENTIN (NEURONTIN) 300 MG CAPSULE    Take 300 mg by mouth at bedtime.   HYDRALAZINE (APRESOLINE) 25 MG TABLET    Take 1 tablet (25 mg total) by mouth every 8 (eight) hours.   INSULIN GLARGINE (LANTUS) 100 UNIT/ML INJECTION    Inject 12 Units into the skin at bedtime.    LORAZEPAM (ATIVAN) 0.5 MG TABLET    Take 1/2 tablet by mouth every morning at 9am; Take one tablet by mouth every evening at 9pm   LUBIPROSTONE (AMITIZA) 24 MCG CAPSULE    Take 24 mcg by mouth 2 (two) times daily with a meal.   MAGNESIUM HYDROXIDE (MILK OF MAGNESIA) 800 MG/5ML SUSPENSION    Take 30 mLs by mouth daily as needed for constipation. 30cc   MENTHOL, TOPICAL ANALGESIC, (BIOFREEZE) 4 % GEL    To right ankle and bilateral knees TID   MENTHOL-CETYLPYRIDINIUM (CEPACOL) 3 MG LOZENGE    Take 1 lozenge by mouth every 4 (four) hours as needed for sore throat.   METFORMIN (GLUCOPHAGE) 500 MG TABLET    Take 1,000 mg by mouth 2 (two) times daily with a meal.    METHOCARBAMOL (ROBAXIN) 500 MG TABLET    Take 1 tablet (500 mg total) by mouth at bedtime.   MIDODRINE (PROAMATINE) 5 MG TABLET    Take 5 mg by mouth 3 (three) times daily.   MIRABEGRON ER (MYRBETRIQ) 25 MG TB24 TABLET    Take 1 tablet (25 mg total) by mouth daily.    POLYETHYLENE GLYCOL (MIRALAX / GLYCOLAX) PACKET    Take 17 g by mouth daily as needed for mild constipation.    PRAVASTATIN (PRAVACHOL) 40 MG TABLET    Take 40 mg by mouth daily.    TRAMADOL (ULTRAM) 50 MG TABLET    Take two tablets by mouth every 8 hours as needed for pain  WHEAT DEXTRIN (BENEFIBER) POWD    Take 1 Applicatorful by mouth daily. Mix with 8oz of fluid  Modified Medications   No medications on file  Discontinued Medications   LEVOFLOXACIN (LEVAQUIN) 500 MG TABLET    Take 1 tablet (500 mg total) by mouth daily.     Physical Exam: Filed Vitals:   03/14/14 1708  BP: 139/79  Pulse: 81  Temp: 97.2 F (36.2 C)  Resp: 20  Weight: 145 lb (65.772 kg)    Physical Exam  Constitutional: She is oriented to person, place, and time. She appears well-developed and well-nourished. No distress.  HENT:  Head: Normocephalic and atraumatic.  Mouth/Throat: Oropharynx is clear and moist.  Neck: Normal range of motion. Neck supple.  Cardiovascular: Normal rate and regular rhythm.   Pulmonary/Chest: Effort normal and breath sounds normal. No respiratory distress.  Abdominal: Soft. Bowel sounds are normal. She exhibits no distension. There is no tenderness.  Musculoskeletal: She exhibits no edema or tenderness.  Right sided hemiparesis   Neurological: She is alert and oriented to person, place, and time.  Skin: Skin is warm and dry. She is not diaphoretic.  Psychiatric: She has a normal mood and affect.    Labs reviewed: Basic Metabolic Panel:  Recent Labs  02/12/14 1537 02/13/14 0419 02/14/14 0515  NA 139 139 138  K 5.3 4.6 4.2  CL 103 104 102  CO2 _0 GLUCOSE 128* 124* 141*  BUN 36* 29* 22  CREATININE 1.14* 0.93 0.75  CALCIUM 9.7 9.7 10.3   Liver Function Tests:  Recent Labs  05/07/13 2223 02/12/14 0653  AST 32 33  ALT 20 31  ALKPHOS 58 79  BILITOT 0.2* 0.5  PROT 6.6 7.5  ALBUMIN 3.3* 3.6   No results for input(s): LIPASE, AMYLASE in the last 8760  hours. No results for input(s): AMMONIA in the last 8760 hours. CBC:  Recent Labs  05/07/13 2223  02/12/14 0730 02/13/14 0419 02/14/14 0515  WBC 8.1  < > 10.1 7.2 7.5  NEUTROABS 5.4  --   --   --   --   HGB 8.6*  < > 12.6 11.8* 13.5  HCT 27.9*  < > 39.2 36.4 41.7  MCV 86.6  < > 94.2 92.6 91.6  PLT 253  < > 175 173 225  < > = values in this interval not displayed. TSH: No results for input(s): TSH in the last 8760 hours. A1C: Lab Results  Component Value Date   HGBA1C 5.3 04/05/2012   Lipid Panel: No results for input(s): CHOL, HDL, LDLCALC, TRIG, CHOLHDL, LDLDIRECT in the last 8760 hours. CBC with Diff    Result: 01/02/2014 3:28 PM   ( Status: F )     C WBC 6.2     4.0-10.5 K/uL SLN   RBC 4.08     3.87-5.11 MIL/uL SLN   Hemoglobin 12.1     12.0-15.0 g/dL SLN   Hematocrit 36.3     36.0-46.0 % SLN   MCV 89.0     78.0-100.0 fL SLN   MCH 29.7     26.0-34.0 pg SLN   MCHC 33.3     30.0-36.0 g/dL SLN   RDW 14.6     11.5-15.5 % SLN   Platelet Count 190     150-400 K/uL SLN   Granulocyte % 66     43-77 % SLN   Absolute Gran 4.1     1.7-7.7 K/uL SLN   Lymph % 24  12-46 % SLN   Absolute Lymph 1.5     0.7-4.0 K/uL SLN   Mono % 8     3-12 % SLN   Absolute Mono 0.5     0.1-1.0 K/uL SLN   Eos % 2     0-5 % SLN   Absolute Eos 0.1     0.0-0.7 K/uL SLN   Baso % 0     0-1 % SLN   Absolute Baso 0.0     0.0-0.1 K/uL SLN   Smear Review Criteria for review not met  SLN   Comprehensive Metabolic Panel    Result: 01/02/2014 4:53 PM   ( Status: F )       Sodium 133   L 135-145 mEq/L SLN   Potassium 4.4     3.5-5.3 mEq/L SLN   Chloride 101     96-112 mEq/L SLN   CO2 24     19-32 mEq/L SLN   Glucose 226   H 70-99 mg/dL SLN   BUN 21     6-23 mg/dL SLN   Creatinine 0.79     0.50-1.10 mg/dL SLN   Bilirubin, Total 0.4     0.2-1.2 mg/dL SLN   Alkaline Phosphatase 68     39-117 U/L SLN   AST/SGOT 16     0-37 U/L SLN   ALT/SGPT 22     0-35 U/L SLN   Total Protein 6.4     6.0-8.3 g/dL SLN     Albumin 3.6     3.5-5.2 g/dL SLN   Calcium 9.4     8.4-10.5 mg/dL SLN   Hemoglobin A1C    Result: 01/02/2014 10:49 PM   ( Status: F )       Hemoglobin A1C 6.6   H <5.7 % SLN C Estimated Average Glucose 143   H <117 mg/dL SLN   Assessment/Plan  1. Constipation, unspecified constipation type Worse, will add senokot S daily to current regimen   2. DM (diabetes mellitus), type 2 with neurological complications Blood sugars in the evening within appropriate range, will follow up A1c  3. Depression Pt reports mood has improved, conts to follow up with psych  4. Late effects of CVA (cerebrovascular accident) Right sided weakness, conts ASA   5. Anemia, iron deficiency Will decrease iron to BID and follow up cbc and iron studies   6. Overactive bladder Currently on myrbetriq for 2 weeks then will stop and start vesicare, to follow up with urology to see which medication is more effective  7. Dysuria To increase fluid intake, will get UA C&S

## 2014-03-18 ENCOUNTER — Non-Acute Institutional Stay (SKILLED_NURSING_FACILITY): Payer: Medicare Other | Admitting: Internal Medicine

## 2014-03-18 DIAGNOSIS — N764 Abscess of vulva: Secondary | ICD-10-CM | POA: Diagnosis not present

## 2014-03-18 NOTE — Progress Notes (Signed)
MRN: [id number] Name: Chelsey Gallagher  Sex: female Age: 64 y.o. DOB: Mar 23, 1950  [place] [facility] Facility/Room:104A Level Of Care: SNF Provider: Inocencio Homes D Emergency Contacts: Extended Emergency Contact Information Primary Emergency Contact: Devoria Albe [place] Phone: [phone] Mobile Phone: [phone] Relation: Daughter  Code Status: FULL  Allergies: Codeine; Flexeril; and Lyrica  Chief Complaint  Patient presents with  . Acute Visit    HPI: Patient is 64 y.o. female who is being seen to I and D a L labial abscess.  Past Medical History  Diagnosis Date  . Diabetes mellitus   . Hypertension   . PAD (peripheral artery disease)     S/p bypass grafting, unclear exactly where  . Nephrolithiasis   . Hyperlipidemia 05/14/2011  . Hearing loss   . Complication of anesthesia     doesn't wake up good- "usually end up in ICU" also experiences post op nausea and vomiting  . Orthostatic hypotension     after surgery.  Marland Kitchen Heart murmur     PCP- [facility]- No tx needed.  . Stroke 05/2011    Weakness on Right. Wears leg brace.  . Foot drop, left   . H/O hiatal hernia     second one  . Diabetic neuropathy     Diabetic neuropathy- has inproved since she quit smoking.  . Arthritis   . Depression     Past Surgical History  Procedure Laterality Date  . Total hip arthroplasty  2008    Right  . Abdominal hysterectomy  2003  . Cesarean section  1984  . Cholecystectomy  1984  . Bypass graft  2003    Abdominal aortic  . Kidney stone surgery  1999  . Appendectomy  2011  . Hernia repair  2011  . Lithotripsy    . Abdominal hysterectomy    . Esophagogastroduodenoscopy N/A 05/09/2013    Procedure: ESOPHAGOGASTRODUODENOSCOPY (EGD);  Surgeon: Lafayette Dragon, MD;  Location: [facility] ENDOSCOPY;  Service: Endoscopy;  Laterality: N/A;  . Colonoscopy N/A 05/10/2013    Procedure: COLONOSCOPY;  Surgeon: Lafayette Dragon, MD;  Location: [facility] ENDOSCOPY;   Service: Endoscopy;  Laterality: N/A;      Medication List       This list is accurate as of: 03/18/14 11:59 PM.  Always use your most recent med list.               acetaminophen 325 MG tablet  Commonly known as:  TYLENOL  Take 650 mg by mouth every 6 (six) hours as needed for pain.     albuterol (2.5 MG/3ML) 0.083% nebulizer solution  Commonly known as:  PROVENTIL  Take 3 mLs (2.5 mg total) by nebulization every 4 (four) hours as needed for wheezing or shortness of breath.     aspirin 81 MG chewable tablet  Chew 81 mg by mouth daily.     baclofen 20 MG tablet  Commonly known as:  LIORESAL  Take 20 mg by mouth at bedtime.     baclofen 10 MG tablet  Commonly known as:  LIORESAL  Take 5 mg by mouth 2 (two) times daily. 5 mg at 8a and 4pm     BENEFIBER Powd  Take 1 Applicatorful by mouth daily. Mix with 8oz of fluid     cholecalciferol 1000 UNITS tablet  Commonly known as:  VITAMIN D  Take 1,000 Units by mouth daily.     clonazePAM 0.5 MG tablet  Commonly known as:  Bobbye [place]  Take 0.5 tablets (0.25 mg total) by mouth 2 (two) times daily as needed for anxiety.     clopidogrel 75 MG tablet  Commonly known as:  PLAVIX  Take 75 mg by mouth daily.     DULoxetine 30 MG capsule  Commonly known as:  CYMBALTA  Take 60 mg by mouth daily.     ferrous sulfate 325 (65 FE) MG tablet  Take 325 mg by mouth 3 (three) times daily.     gabapentin 300 MG capsule  Commonly known as:  NEURONTIN  Take 300 mg by mouth at bedtime.     hydrALAZINE 25 MG tablet  Commonly known as:  APRESOLINE  Take 1 tablet (25 mg total) by mouth every 8 (eight) hours.     insulin glargine 100 UNIT/ML injection  Commonly known as:  LANTUS  Inject 12 Units into the skin at bedtime.     LORazepam 0.5 MG tablet  Commonly known as:  ATIVAN  Take 1/2 tablet by mouth every morning at 9am; Take one tablet by mouth every evening at 9pm     lubiprostone 24 MCG capsule  Commonly known as:  AMITIZA   Take 24 mcg by mouth 2 (two) times daily with a meal.     magnesium hydroxide 800 MG/5ML suspension  Commonly known as:  MILK OF MAGNESIA  Take 30 mLs by mouth daily as needed for constipation. 30cc     Menthol (Topical Analgesic) 4 % Gel  Commonly known as:  BIOFREEZE  To right ankle and bilateral knees TID     menthol-cetylpyridinium 3 MG lozenge  Commonly known as:  CEPACOL  Take 1 lozenge by mouth every 4 (four) hours as needed for sore throat.     metFORMIN 500 MG tablet  Commonly known as:  GLUCOPHAGE  Take 1,000 mg by mouth 2 (two) times daily with a meal.     methocarbamol 500 MG tablet  Commonly known as:  ROBAXIN  Take 1 tablet (500 mg total) by mouth at bedtime.     midodrine 5 MG tablet  Commonly known as:  PROAMATINE  Take 5 mg by mouth 3 (three) times daily.     mirabegron ER 25 MG Tb24 tablet  Commonly known as:  MYRBETRIQ  Take 1 tablet (25 mg total) by mouth daily.     polyethylene glycol packet  Commonly known as:  MIRALAX / GLYCOLAX  Take 17 g by mouth daily as needed for mild constipation.     pravastatin 40 MG tablet  Commonly known as:  PRAVACHOL  Take 40 mg by mouth daily.     traMADol 50 MG tablet  Commonly known as:  ULTRAM  Take two tablets by mouth every 8 hours as needed for pain        No orders of the defined types were placed in this encounter.    Immunization History  Administered Date(s) Administered  . Influenza-Unspecified 12/04/2011  . Pneumococcal Polysaccharide-23 09/03/2011  . Pneumococcal-Unspecified 12/02/2010    History  Substance Use Topics  . Smoking status: Former Smoker -- 0.00 packs/day for 45 years    Types: Cigarettes  . Smokeless tobacco: Former Systems developer    Quit date: 05/08/2011  . Alcohol Use: No    Review of Systems  DATA OBTAINED: from patient, nurse, medical record, family member GENERAL:  no fevers, fatigue, appetite changes SKIN: No itching, rash HEENT: No complaint RESPIRATORY: No cough,  wheezing, SOB CARDIAC: No chest pain, palpitations, lower extremity edema  GI:  No abdominal pain, No N/V/D or constipation, No heartburn or reflux  GU: No dysuria, frequency or urgency, or incontinence; pain labia for several days  MUSCULOSKELETAL: No unrelieved bone/joint pain NEUROLOGIC: No headache, dizziness  PSYCHIATRIC: has been somewhat depressed  Filed Vitals:   04/05/14 1948  BP: 162/74  Pulse: 82  Temp: 98.1 F (36.7 C)  Resp: 18    Physical Exam  GENERAL APPEARANCE: Alert, conversant, No acute distress  SKIN: No diaphoresis rash HEENT: Unremarkable RESPIRATORY: Breathing is even, unlabored. Lung sounds are clear   CARDIOVASCULAR: Heart RRR no murmurs, rubs or gallops. No peripheral edema  GASTROINTESTINAL: Abdomen is soft, non-tender, not distended w/ normal bowel sounds.  GENITOURINARY: L labia majora with swelling, redness and very TTP;just barely fluctuant  MUSCULOSKELETAL: contractures NEUROLOGIC: Cranial nerves 2-12 grossly intact. PSYCHIATRIC: Mood and affect appropriate to situation, no behavioral issues  Patient Active Problem List   Diagnosis Date Noted  . Acute respiratory failure 02/17/2014  . HCAP (healthcare-associated pneumonia) 02/12/2014  . Hypoxia   . Generalized anxiety disorder 08/26/2013  . Late effects of CVA (cerebrovascular accident) 07/08/2013  . Depression   . Hypotension 05/08/2013  . Fecal occult blood test positive 05/08/2013  . Altered mental status 05/08/2013  . Insomnia 04/26/2013  . UTI (urinary tract infection) 07/02/2012  . Anemia, iron deficiency 07/02/2012  . Ingrown toenail 07/02/2012  . Constipation 07/02/2012  . 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 Cerebral artery occlusion with cerebral infarction 04/13/2012  . Orthostatic hypotension 04/06/2012  . Abnormal TSH 04/06/2012  . Spastic hemiplegia affecting dominant side 07/25/2011  . Stroke 05/15/2011  .  Hyperlipidemia 05/14/2011  . Tobacco abuse 05/14/2011  . 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 Lung abnormality 05/11/2011  . CVA (cerebral infarction) 05/08/2011  . Hypertension   . DM (diabetes mellitus), type 2 with neurological complications     CBC    Component Value Date/Time   WBC 7.5 02/14/2014 0515   RBC 4.55 02/14/2014 0515   RBC 2.99* 04/05/2012 0345   HGB 13.5 02/14/2014 0515   HCT 41.7 02/14/2014 0515   PLT 225 02/14/2014 0515   MCV 91.6 02/14/2014 0515   LYMPHSABS 2.0 05/07/2013 2223   MONOABS 0.6 05/07/2013 2223   EOSABS 0.1 05/07/2013 2223   BASOSABS 0.0 05/07/2013 2223    CMP     Component Value Date/Time   NA 138 02/14/2014 0515   K 4.2 02/14/2014 0515   CL 102 02/14/2014 0515   CO2 21 02/14/2014 0515   GLUCOSE 141* 02/14/2014 0515   BUN 22 02/14/2014 0515   CREATININE 0.75 02/14/2014 0515   CALCIUM 10.3 02/14/2014 0515   PROT 7.5 02/12/2014 0653   ALBUMIN 3.6 02/12/2014 0653   AST 33 02/12/2014 0653   ALT 31 02/12/2014 0653   ALKPHOS 79 02/12/2014 0653   BILITOT 0.5 02/12/2014 0653   GFRNONAA 89* 02/14/2014 0515   GFRAA >90 02/14/2014 0515    Assessment and Plan  L LABIAL ABSCESS-  Procedure - after consent ; procedure was difficult because it was done on a bed with a wedgeinstead of a table with stirrups; after sterile prep and local with lidocaine area was opened with 11 blade with return of blood mixed with pus. Interior was swept then packed with iodiform guaze. Pt was put on Doxycycline for a week, warm compresses and wound care will follow and pull packing when drainage decreases, probably 48 hours. Pt expressed good relief of pain after  procedure was complete.  No problem-specific assessment & plan notes found for this encounter.  Pt seen 03/17/2014 Hennie Duos, MD

## 2014-03-20 DIAGNOSIS — N390 Urinary tract infection, site not specified: Secondary | ICD-10-CM | POA: Diagnosis not present

## 2014-03-27 DIAGNOSIS — M1812 Unilateral primary osteoarthritis of first carpometacarpal joint, left hand: Secondary | ICD-10-CM | POA: Diagnosis not present

## 2014-04-05 ENCOUNTER — Encounter: Payer: Self-pay | Admitting: Internal Medicine

## 2014-04-06 ENCOUNTER — Other Ambulatory Visit: Payer: Self-pay | Admitting: Radiology

## 2014-04-06 DIAGNOSIS — I6529 Occlusion and stenosis of unspecified carotid artery: Secondary | ICD-10-CM

## 2014-04-14 ENCOUNTER — Non-Acute Institutional Stay (SKILLED_NURSING_FACILITY): Payer: Medicare Other | Admitting: Nurse Practitioner

## 2014-04-14 DIAGNOSIS — H5789 Other specified disorders of eye and adnexa: Secondary | ICD-10-CM

## 2014-04-14 DIAGNOSIS — H578 Other specified disorders of eye and adnexa: Secondary | ICD-10-CM

## 2014-04-14 NOTE — Progress Notes (Signed)
Patient ID: Chelsey Gallagher, female   DOB: 1951-02-11, 64 y.o.   MRN: [id number]  Nursing Home Location:  [facility]   Place of Service: SNF (31)  Chief Complaint  Patient presents with  . Acute Visit    HPI:  Patient is a long term resident of the facility who was seen today for cc: left eye problems. Pt reported to staff her left eye was sore at the bottom of the eye lid. Problem persisted for several days and included itching. Pt felt has if there was something stuck in her eye for several days. This has now completely resolved. No changes in vision, no pain or discharge currently.   Review of Systems:  Review of Systems  Eyes: Negative for pain, discharge, redness and itching.       See HPI    Medications: Patient's Medications  New Prescriptions   No medications on file  Previous Medications   ACETAMINOPHEN (TYLENOL) 325 MG TABLET    Take 650 mg by mouth every 6 (six) hours as needed for pain.   ALBUTEROL (PROVENTIL) (2.5 MG/3ML) 0.083% NEBULIZER SOLUTION    Take 3 mLs (2.5 mg total) by nebulization every 4 (four) hours as needed for wheezing or shortness of breath.   ASPIRIN 81 MG CHEWABLE TABLET    Chew 81 mg by mouth daily.   BACLOFEN (LIORESAL) 10 MG TABLET    Take 5 mg by mouth 2 (two) times daily. 5 mg at 8a and 4pm   BACLOFEN (LIORESAL) 20 MG TABLET    Take 20 mg by mouth at bedtime.   CHOLECALCIFEROL (VITAMIN D) 1000 UNITS TABLET    Take 1,000 Units by mouth daily.   CLONAZEPAM (KLONOPIN) 0.5 MG TABLET    Take 0.5 tablets (0.25 mg total) by mouth 2 (two) times daily as needed for anxiety.   CLOPIDOGREL (PLAVIX) 75 MG TABLET    Take 75 mg by mouth daily.   DULOXETINE (CYMBALTA) 30 MG CAPSULE    Take 60 mg by mouth daily.   FERROUS SULFATE 325 (65 FE) MG TABLET    Take 325 mg by mouth 3 (three) times daily.    GABAPENTIN (NEURONTIN) 300 MG CAPSULE    Take 300 mg by mouth at bedtime.   HYDRALAZINE (APRESOLINE) 25 MG TABLET    Take 1 tablet (25 mg total) by  mouth every 8 (eight) hours.   INSULIN GLARGINE (LANTUS) 100 UNIT/ML INJECTION    Inject 12 Units into the skin at bedtime.    LORAZEPAM (ATIVAN) 0.5 MG TABLET    Take 1/2 tablet by mouth every morning at 9am; Take one tablet by mouth every evening at 9pm   LUBIPROSTONE (AMITIZA) 24 MCG CAPSULE    Take 24 mcg by mouth 2 (two) times daily with a meal.   MAGNESIUM HYDROXIDE (MILK OF MAGNESIA) 800 MG/5ML SUSPENSION    Take 30 mLs by mouth daily as needed for constipation. 30cc   MENTHOL, TOPICAL ANALGESIC, (BIOFREEZE) 4 % GEL    To right ankle and bilateral knees TID   MENTHOL-CETYLPYRIDINIUM (CEPACOL) 3 MG LOZENGE    Take 1 lozenge by mouth every 4 (four) hours as needed for sore throat.   METFORMIN (GLUCOPHAGE) 500 MG TABLET    Take 1,000 mg by mouth 2 (two) times daily with a meal.    METHOCARBAMOL (ROBAXIN) 500 MG TABLET    Take 1 tablet (500 mg total) by mouth at bedtime.   MIDODRINE (PROAMATINE) 5 MG TABLET  Take 5 mg by mouth 3 (three) times daily.   MIRABEGRON ER (MYRBETRIQ) 25 MG TB24 TABLET    Take 1 tablet (25 mg total) by mouth daily.   POLYETHYLENE GLYCOL (MIRALAX / GLYCOLAX) PACKET    Take 17 g by mouth daily as needed for mild constipation.    PRAVASTATIN (PRAVACHOL) 40 MG TABLET    Take 40 mg by mouth daily.    TRAMADOL (ULTRAM) 50 MG TABLET    Take two tablets by mouth every 8 hours as needed for pain   WHEAT DEXTRIN (BENEFIBER) POWD    Take 1 Applicatorful by mouth daily. Mix with 8oz of fluid  Modified Medications   No medications on file  Discontinued Medications   No medications on file     Physical Exam:  Filed Vitals:   04/14/14 1639  BP: 141/80  Pulse: 82  Temp: 97.5 F (36.4 C)  Resp: 20    Physical Exam  Constitutional: She appears well-developed and well-nourished.  Eyes: Conjunctivae and EOM are normal. Pupils are equal, round, and reactive to light.        Assessment/Plan  1. Itch of eye, left Resolved currently, will order artifical tears QID  as needed.

## 2014-04-25 ENCOUNTER — Non-Acute Institutional Stay (SKILLED_NURSING_FACILITY): Payer: Medicare Other | Admitting: Nurse Practitioner

## 2014-04-25 ENCOUNTER — Encounter: Payer: Self-pay | Admitting: Nurse Practitioner

## 2014-04-25 DIAGNOSIS — I10 Essential (primary) hypertension: Secondary | ICD-10-CM

## 2014-04-25 DIAGNOSIS — N3281 Overactive bladder: Secondary | ICD-10-CM | POA: Diagnosis not present

## 2014-04-25 DIAGNOSIS — F329 Major depressive disorder, single episode, unspecified: Secondary | ICD-10-CM

## 2014-04-25 DIAGNOSIS — G8110 Spastic hemiplegia affecting unspecified side: Secondary | ICD-10-CM | POA: Diagnosis not present

## 2014-04-25 DIAGNOSIS — K5900 Constipation, unspecified: Secondary | ICD-10-CM

## 2014-04-25 DIAGNOSIS — E1149 Type 2 diabetes mellitus with other diabetic neurological complication: Secondary | ICD-10-CM

## 2014-04-25 DIAGNOSIS — E1140 Type 2 diabetes mellitus with diabetic neuropathy, unspecified: Secondary | ICD-10-CM | POA: Diagnosis not present

## 2014-04-25 DIAGNOSIS — R7989 Other specified abnormal findings of blood chemistry: Secondary | ICD-10-CM | POA: Diagnosis not present

## 2014-04-25 DIAGNOSIS — N764 Abscess of vulva: Secondary | ICD-10-CM

## 2014-04-25 DIAGNOSIS — D509 Iron deficiency anemia, unspecified: Secondary | ICD-10-CM | POA: Diagnosis not present

## 2014-04-25 DIAGNOSIS — F32A Depression, unspecified: Secondary | ICD-10-CM

## 2014-04-25 NOTE — Progress Notes (Signed)
Patient ID: Chelsey Gallagher, female   DOB: [birth year], 64 y.o.   MRN: [id number]    Nursing Home Location:  [place] of Service: SNF (31)  PCP: Hennie Duos, MD  Allergies  Allergen Reactions  . Codeine Other (See Comments)    Per Mar  . Flexeril [Cyclobenzaprine Hcl] Other (See Comments)    Per Mar  . Lyrica [Pregabalin] Other (See Comments)    "wires my up"    Chief Complaint  Patient presents with  . Medical Management of Chronic Issues    HPI:  Patient is a 64 y.o. female seen today at [place], seen today for routine follow up. She has a  pmh of CVA with spastic hemiplegia, constipation, anxiety, DM, HTN, and hyperlipidemia.  She had a small amount of vaginal bleeding after using the bathroom 3 days ago.  She says its only occurred one time.  No bleeding since then.  Denies any vaginal discharge or irritation.  Otherwise doing well. Has no major complaints.   Review of Systems:  Review of Systems  Constitutional: Negative for activity change, appetite change, fatigue and unexpected weight change.  HENT: Negative for congestion and hearing loss.   Eyes: Negative.   Respiratory: Negative for cough and shortness of breath.   Cardiovascular: Negative for chest pain, palpitations and leg swelling.  Gastrointestinal: Negative for abdominal pain, diarrhea and constipation.  Genitourinary: Positive for vaginal bleeding (one occurance -- scant amount). Negative for dysuria, vaginal discharge, difficulty urinating and vaginal pain.  Musculoskeletal: Negative for arthralgias.  Skin: Negative for color change and wound.  Neurological: Negative for dizziness and weakness.  Psychiatric/Behavioral: Negative for behavioral problems, confusion and agitation.       Depression, following with psych    Past Medical History  Diagnosis Date  . Diabetes mellitus   . Hypertension   . PAD (peripheral artery disease)     S/p bypass grafting,  unclear exactly where  . Nephrolithiasis   . Hyperlipidemia 05/14/2011  . Hearing loss   . Complication of anesthesia     doesn't wake up good- "usually end up in ICU" also experiences post op nausea and vomiting  . Orthostatic hypotension     after surgery.  Marland Kitchen Heart murmur     PCP- [facility]- No tx needed.  . Stroke 05/2011    Weakness on Right. Wears leg brace.  . Foot drop, left   . H/O hiatal hernia     second one  . Diabetic neuropathy     Diabetic neuropathy- has inproved since she quit smoking.  . Arthritis   . Depression   . Lung abnormality 05/11/2011   Past Surgical History  Procedure Laterality Date  . Total hip arthroplasty  2008    Right  . Abdominal hysterectomy  2003  . Cesarean section  1984  . Cholecystectomy  1984  . Bypass graft  2003    Abdominal aortic  . Kidney stone surgery  1999  . Appendectomy  2011  . Hernia repair  2011  . Lithotripsy    . Abdominal hysterectomy    . Esophagogastroduodenoscopy N/A 05/09/2013    Procedure: ESOPHAGOGASTRODUODENOSCOPY (EGD);  Surgeon: Lafayette Dragon, MD;  Location: [facility] ENDOSCOPY;  Service: Endoscopy;  Laterality: N/A;  . Colonoscopy N/A 05/10/2013    Procedure: COLONOSCOPY;  Surgeon: Lafayette Dragon, MD;  Location: [facility] ENDOSCOPY;  Service: Endoscopy;  Laterality: N/A;   Social History:   reports  that she has quit smoking. Her smoking use included Cigarettes. She smoked 0.00 packs per day for 45 years. She quit smokeless tobacco use about 2 years ago. She reports that she does not drink alcohol or use illicit drugs.  Family History  Problem Relation Age of Onset  . Cancer Maternal Aunt     breast    Medications: Patient's Medications  New Prescriptions   No medications on file  Previous Medications   ACETAMINOPHEN (TYLENOL) 325 MG TABLET    Take 650 mg by mouth every 6 (six) hours as needed for pain.   ALBUTEROL (PROVENTIL) (2.5 MG/3ML) 0.083% NEBULIZER SOLUTION    Take 3 mLs (2.5 mg total) by  nebulization every 4 (four) hours as needed for wheezing or shortness of breath.   ASPIRIN 81 MG CHEWABLE TABLET    Chew 81 mg by mouth daily.   BACLOFEN (LIORESAL) 10 MG TABLET    Take 5 mg by mouth 2 (two) times daily. 5 mg at 8a and 4pm   BACLOFEN (LIORESAL) 20 MG TABLET    Take 20 mg by mouth at bedtime.   CHOLECALCIFEROL (VITAMIN D) 1000 UNITS TABLET    Take 1,000 Units by mouth daily.   CLONAZEPAM (KLONOPIN) 0.5 MG TABLET    Take 0.5 tablets (0.25 mg total) by mouth 2 (two) times daily as needed for anxiety.   CLOPIDOGREL (PLAVIX) 75 MG TABLET    Take 75 mg by mouth daily.   DULOXETINE (CYMBALTA) 30 MG CAPSULE    Take 60 mg by mouth daily.   FERROUS SULFATE 325 (65 FE) MG TABLET    Take 325 mg by mouth 2 (two) times daily with a meal.    GABAPENTIN (NEURONTIN) 300 MG CAPSULE    Take 300 mg by mouth at bedtime.   HYDRALAZINE (APRESOLINE) 25 MG TABLET    Take 1 tablet (25 mg total) by mouth every 8 (eight) hours.   INSULIN GLARGINE (LANTUS) 100 UNIT/ML INJECTION    Inject 12 Units into the skin at bedtime.    LORAZEPAM (ATIVAN) 0.5 MG TABLET    Take 1/2 tablet by mouth every morning at 9am; Take one tablet by mouth every evening at 9pm   LUBIPROSTONE (AMITIZA) 24 MCG CAPSULE    Take 24 mcg by mouth 2 (two) times daily with a meal.   MAGNESIUM HYDROXIDE (MILK OF MAGNESIA) 800 MG/5ML SUSPENSION    Take 30 mLs by mouth daily as needed for constipation. 30cc   MENTHOL, TOPICAL ANALGESIC, (BIOFREEZE) 4 % GEL    To right ankle and bilateral knees TID   MENTHOL-CETYLPYRIDINIUM (CEPACOL) 3 MG LOZENGE    Take 1 lozenge by mouth every 4 (four) hours as needed for sore throat.   METFORMIN (GLUCOPHAGE) 500 MG TABLET    Take 1,000 mg by mouth 2 (two) times daily with a meal.    METHOCARBAMOL (ROBAXIN) 500 MG TABLET    Take 1 tablet (500 mg total) by mouth at bedtime.   MIDODRINE (PROAMATINE) 5 MG TABLET    Take 5 mg by mouth 3 (three) times daily.   MIRABEGRON ER (MYRBETRIQ) 25 MG TB24 TABLET    Take 1  tablet (25 mg total) by mouth daily.   POLYETHYLENE GLYCOL (MIRALAX / GLYCOLAX) PACKET    Take 17 g by mouth daily as needed for mild constipation.    PRAVASTATIN (PRAVACHOL) 40 MG TABLET    Take 40 mg by mouth daily.    SENNOSIDES-DOCUSATE SODIUM (SENOKOT-S) 8.6-50 MG TABLET  Take 1 tablet by mouth daily.   TRAMADOL (ULTRAM) 50 MG TABLET    Take two tablets by mouth every 8 hours as needed for pain   WHEAT DEXTRIN (BENEFIBER) POWD    Take 1 Applicatorful by mouth daily. Mix with 8oz of fluid  Modified Medications   No medications on file  Discontinued Medications   No medications on file     Physical Exam: Filed Vitals:   04/25/14 1102  BP: 140/77  Pulse: 79  Temp: 97.3 F (36.3 C)  TempSrc: Oral  Resp: 18  Weight: 154 lb 12.8 oz (70.217 kg)    Physical Exam  Constitutional: She is oriented to person, place, and time. She appears well-developed and well-nourished. No distress.  HENT:  Head: Normocephalic and atraumatic.  Neck: Normal range of motion. Neck supple.  Cardiovascular: Normal rate, regular rhythm and normal heart sounds.   Pulmonary/Chest: Effort normal and breath sounds normal. No respiratory distress.  Abdominal: Soft. Bowel sounds are normal. She exhibits no distension. There is no tenderness.  Genitourinary: Vagina normal. There is no rash, tenderness, lesion or injury on the right labia. There is no rash, tenderness, lesion or injury on the left labia.  Musculoskeletal: She exhibits no edema or tenderness.  Right sided hemiparesis   Lymphadenopathy:    She has no cervical adenopathy.  Neurological: She is alert and oriented to person, place, and time.  Skin: Skin is warm and dry. She is not diaphoretic.  Psychiatric: She has a normal mood and affect.    Labs reviewed: Basic Metabolic Panel:  Recent Labs  02/12/14 1537 02/13/14 0419 02/14/14 0515  NA 139 139 138  K 5.3 4.6 4.2  CL 103 104 102  CO2 '25 22 21  ' GLUCOSE 128* 124* 141*  BUN 36* 29*  22  CREATININE 1.14* 0.93 0.75  CALCIUM 9.7 9.7 10.3   Liver Function Tests:  Recent Labs  05/07/13 2223 02/12/14 0653  AST 32 33  ALT 20 31  ALKPHOS 58 79  BILITOT 0.2* 0.5  PROT 6.6 7.5  ALBUMIN 3.3* 3.6   No results for input(s): LIPASE, AMYLASE in the last 8760 hours. No results for input(s): AMMONIA in the last 8760 hours. CBC:  Recent Labs  05/07/13 2223  02/12/14 0730 02/13/14 0419 02/14/14 0515  WBC 8.1  < > 10.1 7.2 7.5  NEUTROABS 5.4  --   --   --   --   HGB 8.6*  < > 12.6 11.8* 13.5  HCT 27.9*  < > 39.2 36.4 41.7  MCV 86.6  < > 94.2 92.6 91.6  PLT 253  < > 175 173 225  < > = values in this interval not displayed. TSH: No results for input(s): TSH in the last 8760 hours. A1C: Lab Results  Component Value Date   HGBA1C 5.3 04/05/2012   Lipid Panel: No results for input(s): CHOL, HDL, LDLCALC, TRIG, CHOLHDL, LDLDIRECT in the last 8760 hours. CBC with Diff    Result: 01/02/2014 3:28 PM   ( Status: F )     C WBC 6.2     4.0-10.5 K/uL SLN   RBC 4.08     3.87-5.11 MIL/uL SLN   Hemoglobin 12.1     12.0-15.0 g/dL SLN   Hematocrit 36.3     36.0-46.0 % SLN   MCV 89.0     78.0-100.0 fL SLN   MCH 29.7     26.0-34.0 pg SLN   MCHC 33.3     30.0-36.0  g/dL SLN   RDW 14.6     11.5-15.5 % SLN   Platelet Count 190     150-400 K/uL SLN   Granulocyte % 66     43-77 % SLN   Absolute Gran 4.1     1.7-7.7 K/uL SLN   Lymph % 24     12-46 % SLN   Absolute Lymph 1.5     0.7-4.0 K/uL SLN   Mono % 8     3-12 % SLN   Absolute Mono 0.5     0.1-1.0 K/uL SLN   Eos % 2     0-5 % SLN   Absolute Eos 0.1     0.0-0.7 K/uL SLN   Baso % 0     0-1 % SLN   Absolute Baso 0.0     0.0-0.1 K/uL SLN   Smear Review Criteria for review not met  SLN   Comprehensive Metabolic Panel    Result: 01/02/2014 4:53 PM   ( Status: F )       Sodium 133   L 135-145 mEq/L SLN   Potassium 4.4     3.5-5.3 mEq/L SLN   Chloride 101     96-112 mEq/L SLN   CO2 24     19-32 mEq/L SLN   Glucose 226    H 70-99 mg/dL SLN   BUN 21     6-23 mg/dL SLN   Creatinine 0.79     0.50-1.10 mg/dL SLN   Bilirubin, Total 0.4     0.2-1.2 mg/dL SLN   Alkaline Phosphatase 68     39-117 U/L SLN   AST/SGOT 16     0-37 U/L SLN   ALT/SGPT 22     0-35 U/L SLN   Total Protein 6.4     6.0-8.3 g/dL SLN   Albumin 3.6     3.5-5.2 g/dL SLN   Calcium 9.4     8.4-10.5 mg/dL SLN   Hemoglobin A1C    Result: 01/02/2014 10:49 PM   ( Status: F )       Hemoglobin A1C 6.6   H <5.7 % SLN C Estimated Average Glucose 143   H <117 mg/dL SLN   Iron and IBC    Result: 03/20/2014 11:31 AM   ( Status: F )     C Iron 32   L 42-145 ug/dL SLN   UIBC 263     125-400 ug/dL SLN   TIBC 295     250-470 ug/dL SLN   %SAT 11   L 20-55 % SLN   CBC with Diff    Result: 03/20/2014 10:36 AM   ( Status: F )       WBC 6.5     4.0-10.5 K/uL SLN   RBC 3.06   L 3.87-5.11 MIL/uL SLN   Hemoglobin 9.2   L 12.0-15.0 g/dL SLN   Hematocrit 27.4   L 36.0-46.0 % SLN   MCV 89.5     78.0-100.0 fL SLN   MCH 30.1     26.0-34.0 pg SLN   MCHC 33.6     30.0-36.0 g/dL SLN   RDW 14.6     11.5-15.5 % SLN   Platelet Count 302     150-400 K/uL SLN   MPV 9.5     8.6-12.4 fL SLN C Granulocyte % 69     43-77 % SLN   Absolute Gran 4.5     1.7-7.7 K/uL SLN   Lymph % 20  12-46 % SLN   Absolute Lymph 1.3     0.7-4.0 K/uL SLN   Mono % 9     3-12 % SLN   Absolute Mono 0.6     0.1-1.0 K/uL SLN   Eos % 2     0-5 % SLN   Absolute Eos 0.1     0.0-0.7 K/uL SLN   Baso % 0     0-1 % SLN   Absolute Baso 0.0     0.0-0.1 K/uL SLN   Smear Review Criteria for review not met  SLN   Basic Metabolic Panel    Result: 03/20/2014 11:31 AM   ( Status: F )       Sodium 135     135-145 mEq/L SLN   Potassium 4.3     3.5-5.3 mEq/L SLN   Chloride 102     96-112 mEq/L SLN   CO2 24     19-32 mEq/L SLN   Glucose 106   H 70-99 mg/dL SLN   BUN 21     6-23 mg/dL SLN   Creatinine 0.96     0.50-1.10 mg/dL SLN   Calcium 9.6     8.4-10.5 mg/dL SLN   TSH    Result: 03/20/2014 12:34 PM   (  Status: F )       TSH 2.076     0.350-4.500 uIU/mL SLN   Ferritin    Result: 03/20/2014 12:34 PM   ( Status: F )       Ferritin 89     10-291 ng/mL SLN  Assessment/Plan  1. Essential hypertension Last blood pressure was 140/77.  Stable on meds, continue same.    2. Constipation, unspecified constipation type Stable.  Continue with Senokot-S.   Patient does not complain of constipation at this time.   3. DM (diabetes mellitus), type 2 with neurological complications CBG's range from 116-179.  Will change CBG checks to am, before breakfast to better evaluate.  Continue Lantus.  Discussed diet modifications with patient. Will order A1c.   4. Spastic hemiplegia affecting dominant side Stable.  Continues to work with physical therapy.  She uses a motorized wheelchair as well as walker with leg brace.    5. Abnormal TSH Stable. TSH 2.076 in January 2016.  Will continue to monitor as needed.   6. Anemia, iron deficiency Iron level 32 in January 2016.  Will increase ferrous sulfate to three times a day.  Vitamin C 500 mg bid with breakfast and supper.  Will recheck iron in 2 months.   7. Depression Spoke with patient about decreasing her Cymbalta, however, patient says she feels like her mood has stabilized and does not want to decrease medicine.  She says that most days she is feeling "ok", but then other days she has crying episodes and tends to be depressed about not living at home.  She does try to participate in activities and uses [employer] to run errands, which she enjoys.  She expresses sadness over not being able to visit with her mother and daughter as much as she would like.  Will keep Cymbalta at current dose.   8. Overactive bladder Patient recalls recent appointment with Urology and feels as if the Vesicare is helping her symptoms more than the Myrbetriq.  Will d/c Myrbetriq and start Vesicare 37m po daily.    9. Left genital labial abscess Resolved.

## 2014-04-26 DIAGNOSIS — E0821 Diabetes mellitus due to underlying condition with diabetic nephropathy: Secondary | ICD-10-CM | POA: Diagnosis not present

## 2014-05-08 ENCOUNTER — Non-Acute Institutional Stay (SKILLED_NURSING_FACILITY): Payer: Medicare Other | Admitting: Internal Medicine

## 2014-05-08 ENCOUNTER — Encounter: Payer: Self-pay | Admitting: Internal Medicine

## 2014-05-08 DIAGNOSIS — M24541 Contracture, right hand: Secondary | ICD-10-CM | POA: Diagnosis not present

## 2014-05-08 DIAGNOSIS — M24529 Contracture, unspecified elbow: Secondary | ICD-10-CM | POA: Insufficient documentation

## 2014-05-08 DIAGNOSIS — M1812 Unilateral primary osteoarthritis of first carpometacarpal joint, left hand: Secondary | ICD-10-CM | POA: Diagnosis not present

## 2014-05-08 DIAGNOSIS — F411 Generalized anxiety disorder: Secondary | ICD-10-CM

## 2014-05-08 DIAGNOSIS — M24531 Contracture, right wrist: Secondary | ICD-10-CM | POA: Diagnosis not present

## 2014-05-08 DIAGNOSIS — M1990 Unspecified osteoarthritis, unspecified site: Secondary | ICD-10-CM | POA: Insufficient documentation

## 2014-05-08 DIAGNOSIS — D509 Iron deficiency anemia, unspecified: Secondary | ICD-10-CM | POA: Diagnosis not present

## 2014-05-08 DIAGNOSIS — F329 Major depressive disorder, single episode, unspecified: Secondary | ICD-10-CM | POA: Diagnosis not present

## 2014-05-08 DIAGNOSIS — M24521 Contracture, right elbow: Secondary | ICD-10-CM | POA: Diagnosis not present

## 2014-05-08 DIAGNOSIS — E1140 Type 2 diabetes mellitus with diabetic neuropathy, unspecified: Secondary | ICD-10-CM | POA: Diagnosis not present

## 2014-05-08 DIAGNOSIS — E1149 Type 2 diabetes mellitus with other diabetic neurological complication: Secondary | ICD-10-CM

## 2014-05-08 DIAGNOSIS — F32A Depression, unspecified: Secondary | ICD-10-CM

## 2014-05-08 NOTE — Assessment & Plan Note (Signed)
03/20/2014 Hb 9.2/27.2 with iron - 32 , low, with sat - 11% ,also low; iron supplement increased to TID

## 2014-05-08 NOTE — Assessment & Plan Note (Signed)
Depression worsened after hospitalization in 01/2014 but seems to be improving ; Plan- continue cymbalt 60 mg as pt is doing well.

## 2014-05-08 NOTE — Assessment & Plan Note (Signed)
Exacerbation after hospital admission but seems to be improving now; Plan continue scheduled ativan and prn klonopin as pt appears to be benefiting

## 2014-05-08 NOTE — Assessment & Plan Note (Signed)
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

## 2014-05-08 NOTE — Progress Notes (Signed)
MRN: [id number] Name: Chelsey Gallagher  Sex: female Age: 64 y.o. DOB: 07-Jul-1950  [place] #: [name] Facility/Room:104B Level Of Care: SNF Provider: Inocencio Homes D Emergency Contacts: Extended Emergency Contact Information Primary Emergency Contact: Devoria Albe [place] Phone: [phone] Mobile Phone: [phone] Relation: Daughter  Code Status:DNR   Allergies: Codeine; Flexeril; and Lyrica  Chief Complaint  Patient presents with  . Medical Management of Chronic Issues    HPI: Patient is 64 y.o. female who is being seen for routine issues.  Past Medical History  Diagnosis Date  . Diabetes mellitus   . Hypertension   . PAD (peripheral artery disease)     S/p bypass grafting, unclear exactly where  . Nephrolithiasis   . Hyperlipidemia 05/14/2011  . Hearing loss   . Complication of anesthesia     doesn't wake up good- "usually end up in ICU" also experiences post op nausea and vomiting  . Orthostatic hypotension     after surgery.  Marland Kitchen Heart murmur     PCP- [facility]- No tx needed.  . Stroke 05/2011    Weakness on Right. Wears leg brace.  . Foot drop, left   . H/O hiatal hernia     second one  . Diabetic neuropathy     Diabetic neuropathy- has inproved since she quit smoking.  . Arthritis   . Depression   . Lung abnormality 05/11/2011    Past Surgical History  Procedure Laterality Date  . Total hip arthroplasty  2008    Right  . Abdominal hysterectomy  2003  . Cesarean section  1984  . Cholecystectomy  1984  . Bypass graft  2003    Abdominal aortic  . Kidney stone surgery  1999  . Appendectomy  2011  . Hernia repair  2011  . Lithotripsy    . Abdominal hysterectomy    . Esophagogastroduodenoscopy N/A 05/09/2013    Procedure: ESOPHAGOGASTRODUODENOSCOPY (EGD);  Surgeon: Lafayette Dragon, MD;  Location: [facility] ENDOSCOPY;  Service: Endoscopy;  Laterality: N/A;  . Colonoscopy N/A 05/10/2013    Procedure: COLONOSCOPY;  Surgeon: Lafayette Dragon, MD;  Location: [facility] ENDOSCOPY;  Service: Endoscopy;  Laterality: N/A;      Medication List       This list is accurate as of: 05/08/14 11:58 AM.  Always use your most recent med list.               acetaminophen 325 MG tablet  Commonly known as:  TYLENOL  Take 650 mg by mouth every 6 (six) hours as needed for pain.     albuterol (2.5 MG/3ML) 0.083% nebulizer solution  Commonly known as:  PROVENTIL  Take 3 mLs (2.5 mg total) by nebulization every 4 (four) hours as needed for wheezing or shortness of breath.     aspirin 81 MG chewable tablet  Chew 81 mg by mouth daily.     baclofen 20 MG tablet  Commonly known as:  LIORESAL  Take 20 mg by mouth at bedtime.     baclofen 10 MG tablet  Commonly known as:  LIORESAL  Take 5 mg by mouth 2 (two) times daily. 5 mg at 8a and 4pm     BENEFIBER Powd  Take 1 Applicatorful by mouth daily. Mix with 8oz of fluid     cholecalciferol 1000 UNITS tablet  Commonly known as:  VITAMIN D  Take 1,000 Units by mouth daily.     clonazePAM 0.5 MG tablet  Commonly known  as:  KLONOPIN  Take 0.5 tablets (0.25 mg total) by mouth 2 (two) times daily as needed for anxiety.     clopidogrel 75 MG tablet  Commonly known as:  PLAVIX  Take 75 mg by mouth daily.     DULoxetine 30 MG capsule  Commonly known as:  CYMBALTA  Take 60 mg by mouth daily.     ferrous sulfate 325 (65 FE) MG tablet  Take 325 mg by mouth 2 (two) times daily with a meal.     gabapentin 300 MG capsule  Commonly known as:  NEURONTIN  Take 300 mg by mouth at bedtime.     hydrALAZINE 25 MG tablet  Commonly known as:  APRESOLINE  Take 1 tablet (25 mg total) by mouth every 8 (eight) hours.     insulin glargine 100 UNIT/ML injection  Commonly known as:  LANTUS  Inject 12 Units into the skin at bedtime.     LORazepam 0.5 MG tablet  Commonly known as:  ATIVAN  Take 1/2 tablet by mouth every morning at 9am; Take one tablet by mouth every evening at 9pm     lubiprostone  24 MCG capsule  Commonly known as:  AMITIZA  Take 24 mcg by mouth 2 (two) times daily with a meal.     magnesium hydroxide 800 MG/5ML suspension  Commonly known as:  MILK OF MAGNESIA  Take 30 mLs by mouth daily as needed for constipation. 30cc     Menthol (Topical Analgesic) 4 % Gel  Commonly known as:  BIOFREEZE  To right ankle and bilateral knees TID     menthol-cetylpyridinium 3 MG lozenge  Commonly known as:  CEPACOL  Take 1 lozenge by mouth every 4 (four) hours as needed for sore throat.     metFORMIN 500 MG tablet  Commonly known as:  GLUCOPHAGE  Take 1,000 mg by mouth 2 (two) times daily with a meal.     methocarbamol 500 MG tablet  Commonly known as:  ROBAXIN  Take 1 tablet (500 mg total) by mouth at bedtime.     midodrine 5 MG tablet  Commonly known as:  PROAMATINE  Take 5 mg by mouth 3 (three) times daily.     mirabegron ER 25 MG Tb24 tablet  Commonly known as:  MYRBETRIQ  Take 1 tablet (25 mg total) by mouth daily.     polyethylene glycol packet  Commonly known as:  MIRALAX / GLYCOLAX  Take 17 g by mouth daily as needed for mild constipation.     pravastatin 40 MG tablet  Commonly known as:  PRAVACHOL  Take 40 mg by mouth daily.     sennosides-docusate sodium 8.6-50 MG tablet  Commonly known as:  SENOKOT-S  Take 1 tablet by mouth daily.     traMADol 50 MG tablet  Commonly known as:  ULTRAM  Take two tablets by mouth every 8 hours as needed for pain        No orders of the defined types were placed in this encounter.    Immunization History  Administered Date(s) Administered  . Influenza-Unspecified 12/04/2011  . Pneumococcal Polysaccharide-23 09/03/2011  . Pneumococcal-Unspecified 12/02/2010    History  Substance Use Topics  . Smoking status: Former Smoker -- 0.00 packs/day for 45 years    Types: Cigarettes  . Smokeless tobacco: Former Systems developer    Quit date: 05/08/2011  . Alcohol Use: No    Review of Systems  DATA OBTAINED: from patient,  nurse GENERAL:  no fevers,  fatigue, appetite changes SKIN: No itching, rash HEENT: No complaint RESPIRATORY: No cough, wheezing, SOB CARDIAC: No chest pain, palpitations, lower extremity edema  GI: No abdominal pain, No N/V/D or constipation, No heartburn or reflux  GU: No dysuria, frequency or urgency, or incontinence  MUSCULOSKELETAL: contractures in RUE and pt would like botox injections suggested by OT; also would like booties for feet \/ankles at night NEUROLOGIC: No headache, dizziness  PSYCHIATRIC: No overt anxiety or sadness  Filed Vitals:   05/08/14 1153  BP: 117/73  Pulse: 87  Temp: 97.4 F (36.3 C)  Resp: 18    Physical Exam  GENERAL APPEARANCE: Alert, conversant, No acute distress  SKIN: No diaphoresis rash, HEENT: Unremarkable RESPIRATORY: Breathing is even, unlabored. Lung sounds are clear   CARDIOVASCULAR: Heart RRR no murmurs, rubs or gallops. No peripheral edema  GASTROINTESTINAL: Abdomen is soft, non-tender, not distended w/ normal bowel sounds.  GENITOURINARY: Bladder non tender, not distended  MUSCULOSKELETAL: Flexion contractures at R elbow, wrist ,fingers NEUROLOGIC: Cranial nerves 2-12 grossly intact. Moves all extremities PSYCHIATRIC: Mood and affect appropriate to situation, no behavioral issues  Patient Active Problem List   Diagnosis Date Noted  . Generalized anxiety disorder 08/26/2013  . Late effects of CVA (cerebrovascular accident) 07/08/2013  . Depression   . Hypotension 05/08/2013  . Fecal occult blood test positive 05/08/2013  . Altered mental status 05/08/2013  . Insomnia 04/26/2013  . UTI (urinary tract infection) 07/02/2012  . Anemia, iron deficiency 07/02/2012  . Constipation 07/02/2012  . Carotid stenosis 04/13/2012  . Cerebral artery occlusion with cerebral infarction 04/13/2012  . Orthostatic hypotension 04/06/2012  . Abnormal TSH 04/06/2012  . Spastic hemiplegia affecting dominant side 07/25/2011  . Stroke 05/15/2011  .  Hyperlipidemia 05/14/2011  . Tobacco abuse 05/14/2011  . CVA (cerebral infarction) 05/08/2011  . Hypertension   . DM (diabetes mellitus), type 2 with neurological complications     CBC    Component Value Date/Time   WBC 7.5 02/14/2014 0515   RBC 4.55 02/14/2014 0515   RBC 2.99* 04/05/2012 0345   HGB 13.5 02/14/2014 0515   HCT 41.7 02/14/2014 0515   PLT 225 02/14/2014 0515   MCV 91.6 02/14/2014 0515   LYMPHSABS 2.0 05/07/2013 2223   MONOABS 0.6 05/07/2013 2223   EOSABS 0.1 05/07/2013 2223   BASOSABS 0.0 05/07/2013 2223    CMP     Component Value Date/Time   NA 138 02/14/2014 0515   K 4.2 02/14/2014 0515   CL 102 02/14/2014 0515   CO2 21 02/14/2014 0515   GLUCOSE 141* 02/14/2014 0515   BUN 22 02/14/2014 0515   CREATININE 0.75 02/14/2014 0515   CALCIUM 10.3 02/14/2014 0515   PROT 7.5 02/12/2014 0653   ALBUMIN 3.6 02/12/2014 0653   AST 33 02/12/2014 0653   ALT 31 02/12/2014 0653   ALKPHOS 79 02/12/2014 0653   BILITOT 0.5 02/12/2014 0653   GFRNONAA 89* 02/14/2014 0515   GFRAA >90 02/14/2014 0515    Assessment and Plan  No problem-specific assessment & plan notes found for this encounter.   Hennie Duos, MD

## 2014-05-08 NOTE — Assessment & Plan Note (Signed)
Seen by [facility] [facility] and had has injections 02/27/14; still aches but thinks may be better;has follow up in 3/21.

## 2014-05-08 NOTE — Assessment & Plan Note (Signed)
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

## 2014-05-08 NOTE — Assessment & Plan Note (Signed)
Aic with BID metformin and daily lantus was excellent at 5.8 in 04/26/2014;Plan - continue current regimen.

## 2014-05-22 DIAGNOSIS — M1812 Unilateral primary osteoarthritis of first carpometacarpal joint, left hand: Secondary | ICD-10-CM | POA: Diagnosis not present

## 2014-05-30 ENCOUNTER — Telehealth: Payer: Self-pay | Admitting: Neurology

## 2014-05-30 NOTE — Telephone Encounter (Signed)
error 

## 2014-05-31 ENCOUNTER — Other Ambulatory Visit: Payer: Self-pay

## 2014-05-31 MED ORDER — LORAZEPAM 0.5 MG PO TABS: ORAL_TABLET | ORAL | Status: DC

## 2014-05-31 NOTE — Telephone Encounter (Signed)
Faxed to [facility] Fax Number: [phone], Phone Number [phone]  

## 2014-06-01 DIAGNOSIS — E119 Type 2 diabetes mellitus without complications: Secondary | ICD-10-CM | POA: Diagnosis not present

## 2014-06-01 DIAGNOSIS — M79676 Pain in unspecified toe(s): Secondary | ICD-10-CM | POA: Diagnosis not present

## 2014-06-01 DIAGNOSIS — B351 Tinea unguium: Secondary | ICD-10-CM | POA: Diagnosis not present

## 2014-06-02 DIAGNOSIS — G8191 Hemiplegia, unspecified affecting right dominant side: Secondary | ICD-10-CM | POA: Diagnosis not present

## 2014-06-02 DIAGNOSIS — M6281 Muscle weakness (generalized): Secondary | ICD-10-CM | POA: Diagnosis not present

## 2014-06-03 DIAGNOSIS — G8191 Hemiplegia, unspecified affecting right dominant side: Secondary | ICD-10-CM | POA: Diagnosis not present

## 2014-06-03 DIAGNOSIS — M6281 Muscle weakness (generalized): Secondary | ICD-10-CM | POA: Diagnosis not present

## 2014-06-05 DIAGNOSIS — M6281 Muscle weakness (generalized): Secondary | ICD-10-CM | POA: Diagnosis not present

## 2014-06-05 DIAGNOSIS — G8191 Hemiplegia, unspecified affecting right dominant side: Secondary | ICD-10-CM | POA: Diagnosis not present

## 2014-06-06 DIAGNOSIS — M6281 Muscle weakness (generalized): Secondary | ICD-10-CM | POA: Diagnosis not present

## 2014-06-06 DIAGNOSIS — G8191 Hemiplegia, unspecified affecting right dominant side: Secondary | ICD-10-CM | POA: Diagnosis not present

## 2014-06-07 DIAGNOSIS — G8191 Hemiplegia, unspecified affecting right dominant side: Secondary | ICD-10-CM | POA: Diagnosis not present

## 2014-06-07 DIAGNOSIS — M6281 Muscle weakness (generalized): Secondary | ICD-10-CM | POA: Diagnosis not present

## 2014-06-08 DIAGNOSIS — G8191 Hemiplegia, unspecified affecting right dominant side: Secondary | ICD-10-CM | POA: Diagnosis not present

## 2014-06-08 DIAGNOSIS — M6281 Muscle weakness (generalized): Secondary | ICD-10-CM | POA: Diagnosis not present

## 2014-06-09 DIAGNOSIS — M6281 Muscle weakness (generalized): Secondary | ICD-10-CM | POA: Diagnosis not present

## 2014-06-09 DIAGNOSIS — G8191 Hemiplegia, unspecified affecting right dominant side: Secondary | ICD-10-CM | POA: Diagnosis not present

## 2014-06-12 DIAGNOSIS — M6281 Muscle weakness (generalized): Secondary | ICD-10-CM | POA: Diagnosis not present

## 2014-06-12 DIAGNOSIS — G8191 Hemiplegia, unspecified affecting right dominant side: Secondary | ICD-10-CM | POA: Diagnosis not present

## 2014-06-13 DIAGNOSIS — G8191 Hemiplegia, unspecified affecting right dominant side: Secondary | ICD-10-CM | POA: Diagnosis not present

## 2014-06-13 DIAGNOSIS — M6281 Muscle weakness (generalized): Secondary | ICD-10-CM | POA: Diagnosis not present

## 2014-06-14 DIAGNOSIS — G8191 Hemiplegia, unspecified affecting right dominant side: Secondary | ICD-10-CM | POA: Diagnosis not present

## 2014-06-14 DIAGNOSIS — M6281 Muscle weakness (generalized): Secondary | ICD-10-CM | POA: Diagnosis not present

## 2014-06-15 DIAGNOSIS — G8191 Hemiplegia, unspecified affecting right dominant side: Secondary | ICD-10-CM | POA: Diagnosis not present

## 2014-06-15 DIAGNOSIS — M6281 Muscle weakness (generalized): Secondary | ICD-10-CM | POA: Diagnosis not present

## 2014-06-16 DIAGNOSIS — M6281 Muscle weakness (generalized): Secondary | ICD-10-CM | POA: Diagnosis not present

## 2014-06-16 DIAGNOSIS — G8191 Hemiplegia, unspecified affecting right dominant side: Secondary | ICD-10-CM | POA: Diagnosis not present

## 2014-06-17 DIAGNOSIS — G8191 Hemiplegia, unspecified affecting right dominant side: Secondary | ICD-10-CM | POA: Diagnosis not present

## 2014-06-17 DIAGNOSIS — M6281 Muscle weakness (generalized): Secondary | ICD-10-CM | POA: Diagnosis not present

## 2014-06-19 DIAGNOSIS — M6281 Muscle weakness (generalized): Secondary | ICD-10-CM | POA: Diagnosis not present

## 2014-06-19 DIAGNOSIS — G8191 Hemiplegia, unspecified affecting right dominant side: Secondary | ICD-10-CM | POA: Diagnosis not present

## 2014-06-20 ENCOUNTER — Other Ambulatory Visit: Payer: Self-pay | Admitting: *Deleted

## 2014-06-20 DIAGNOSIS — G8191 Hemiplegia, unspecified affecting right dominant side: Secondary | ICD-10-CM | POA: Diagnosis not present

## 2014-06-20 DIAGNOSIS — M6281 Muscle weakness (generalized): Secondary | ICD-10-CM | POA: Diagnosis not present

## 2014-06-20 MED ORDER — TRAMADOL HCL 50 MG PO TABS: ORAL_TABLET | ORAL | Status: DC

## 2014-06-20 NOTE — Telephone Encounter (Signed)
[facility] 

## 2014-06-21 DIAGNOSIS — G8191 Hemiplegia, unspecified affecting right dominant side: Secondary | ICD-10-CM | POA: Diagnosis not present

## 2014-06-21 DIAGNOSIS — M6281 Muscle weakness (generalized): Secondary | ICD-10-CM | POA: Diagnosis not present

## 2014-06-22 DIAGNOSIS — G8191 Hemiplegia, unspecified affecting right dominant side: Secondary | ICD-10-CM | POA: Diagnosis not present

## 2014-06-22 DIAGNOSIS — M6281 Muscle weakness (generalized): Secondary | ICD-10-CM | POA: Diagnosis not present

## 2014-06-23 DIAGNOSIS — G8191 Hemiplegia, unspecified affecting right dominant side: Secondary | ICD-10-CM | POA: Diagnosis not present

## 2014-06-23 DIAGNOSIS — M6281 Muscle weakness (generalized): Secondary | ICD-10-CM | POA: Diagnosis not present

## 2014-06-24 DIAGNOSIS — M6281 Muscle weakness (generalized): Secondary | ICD-10-CM | POA: Diagnosis not present

## 2014-06-24 DIAGNOSIS — G8191 Hemiplegia, unspecified affecting right dominant side: Secondary | ICD-10-CM | POA: Diagnosis not present

## 2014-06-25 DIAGNOSIS — M6281 Muscle weakness (generalized): Secondary | ICD-10-CM | POA: Diagnosis not present

## 2014-06-25 DIAGNOSIS — G8191 Hemiplegia, unspecified affecting right dominant side: Secondary | ICD-10-CM | POA: Diagnosis not present

## 2014-06-26 DIAGNOSIS — F329 Major depressive disorder, single episode, unspecified: Secondary | ICD-10-CM | POA: Diagnosis not present

## 2014-06-26 DIAGNOSIS — M6281 Muscle weakness (generalized): Secondary | ICD-10-CM | POA: Diagnosis not present

## 2014-06-26 DIAGNOSIS — F411 Generalized anxiety disorder: Secondary | ICD-10-CM | POA: Diagnosis not present

## 2014-06-26 DIAGNOSIS — G8191 Hemiplegia, unspecified affecting right dominant side: Secondary | ICD-10-CM | POA: Diagnosis not present

## 2014-06-27 DIAGNOSIS — M6281 Muscle weakness (generalized): Secondary | ICD-10-CM | POA: Diagnosis not present

## 2014-06-27 DIAGNOSIS — G8191 Hemiplegia, unspecified affecting right dominant side: Secondary | ICD-10-CM | POA: Diagnosis not present

## 2014-06-29 DIAGNOSIS — G8191 Hemiplegia, unspecified affecting right dominant side: Secondary | ICD-10-CM | POA: Diagnosis not present

## 2014-06-29 DIAGNOSIS — M6281 Muscle weakness (generalized): Secondary | ICD-10-CM | POA: Diagnosis not present

## 2014-06-30 ENCOUNTER — Non-Acute Institutional Stay (SKILLED_NURSING_FACILITY): Payer: Medicare Other | Admitting: Nurse Practitioner

## 2014-06-30 DIAGNOSIS — D509 Iron deficiency anemia, unspecified: Secondary | ICD-10-CM

## 2014-06-30 DIAGNOSIS — M6281 Muscle weakness (generalized): Secondary | ICD-10-CM | POA: Diagnosis not present

## 2014-06-30 DIAGNOSIS — M1990 Unspecified osteoarthritis, unspecified site: Secondary | ICD-10-CM

## 2014-06-30 DIAGNOSIS — F411 Generalized anxiety disorder: Secondary | ICD-10-CM

## 2014-06-30 DIAGNOSIS — I6990 Unspecified sequelae of unspecified cerebrovascular disease: Secondary | ICD-10-CM | POA: Diagnosis not present

## 2014-06-30 DIAGNOSIS — G8191 Hemiplegia, unspecified affecting right dominant side: Secondary | ICD-10-CM | POA: Diagnosis not present

## 2014-06-30 DIAGNOSIS — E1140 Type 2 diabetes mellitus with diabetic neuropathy, unspecified: Secondary | ICD-10-CM

## 2014-06-30 DIAGNOSIS — N3281 Overactive bladder: Secondary | ICD-10-CM | POA: Diagnosis not present

## 2014-06-30 DIAGNOSIS — E1149 Type 2 diabetes mellitus with other diabetic neurological complication: Secondary | ICD-10-CM

## 2014-06-30 DIAGNOSIS — G8110 Spastic hemiplegia affecting unspecified side: Secondary | ICD-10-CM

## 2014-06-30 NOTE — Progress Notes (Signed)
Patient ID: Chelsey Gallagher, female   DOB: 1950/03/19, 64 y.o.   MRN: [id number]    Nursing Home Location:  [place] of Service: [facility] (31)  PCP: Hennie Duos, MD  Allergies  Allergen Reactions  . Codeine Other (See Comments)    Per Mar  . Flexeril [Cyclobenzaprine Hcl] Other (See Comments)    Per Mar  . Lyrica [Pregabalin] Other (See Comments)    "wires my up"    Chief Complaint  Patient presents with  . Medical Management of Chronic Issues    HPI:  Patient is a 64 y.o. female seen today at [facility], seen today for routine follow up. She has a  pmh of CVA with spastic hemiplegia, constipation, anxiety, DM, HTN, and hyperlipidemia.  Pt reports she has had a headache over the last few days that has finally resolved with increased rest. Hx of migraines but feels like this one was sinus related due to increased congestion. Overall feeling better. No fevers or chills. Reports blood sugars have been in appropriate range however do not see this logged in computer but recent a1c at goal. Reports increased ankle pain to right ankle when she is trying to walk. Has appt to follow up with ortho next week.   Review of Systems:  Review of Systems  Constitutional: Negative for activity change, appetite change, fatigue and unexpected weight change.  HENT: Positive for congestion and sinus pressure (however has improved). Negative for hearing loss.   Eyes: Negative.   Respiratory: Negative for cough and shortness of breath.   Cardiovascular: Negative for chest pain, palpitations and leg swelling.  Gastrointestinal: Positive for constipation (controlled on medications). Negative for abdominal pain and diarrhea.  Genitourinary: Positive for frequency (vesicare helps). Negative for dysuria, vaginal discharge, difficulty urinating and vaginal pain.  Musculoskeletal: Negative for arthralgias.  Skin: Negative for color change and wound.  Neurological:  Positive for numbness. Negative for dizziness and weakness.  Psychiatric/Behavioral: Negative for behavioral problems, confusion and agitation.       Anxiety and Depression, following with psych, overall feels like anxiety and depression has improved    Past Medical History  Diagnosis Date  . Diabetes mellitus   . Hypertension   . PAD (peripheral artery disease)     S/p bypass grafting, unclear exactly where  . Nephrolithiasis   . Hyperlipidemia 05/14/2011  . Hearing loss   . Complication of anesthesia     doesn't wake up good- "usually end up in ICU" also experiences post op nausea and vomiting  . Orthostatic hypotension     after surgery.  Marland Kitchen Heart murmur     PCP- [facility]- No tx needed.  . Stroke 05/2011    Weakness on Right. Wears leg brace.  . Foot drop, left   . H/O hiatal hernia     second one  . Diabetic neuropathy     Diabetic neuropathy- has inproved since she quit smoking.  . Arthritis   . Depression   . Lung abnormality 05/11/2011   Past Surgical History  Procedure Laterality Date  . Total hip arthroplasty  2008    Right  . Abdominal hysterectomy  2003  . Cesarean section  1984  . Cholecystectomy  1984  . Bypass graft  2003    Abdominal aortic  . Kidney stone surgery  1999  . Appendectomy  2011  . Hernia repair  2011  . Lithotripsy    . Abdominal hysterectomy    .  Esophagogastroduodenoscopy N/A 05/09/2013    Procedure: ESOPHAGOGASTRODUODENOSCOPY (EGD);  Surgeon: Lafayette Dragon, MD;  Location: [facility] ENDOSCOPY;  Service: Endoscopy;  Laterality: N/A;  . Colonoscopy N/A 05/10/2013    Procedure: COLONOSCOPY;  Surgeon: Lafayette Dragon, MD;  Location: [facility] ENDOSCOPY;  Service: Endoscopy;  Laterality: N/A;   Social History:   reports that she has quit smoking. Her smoking use included Cigarettes. She smoked 0.00 packs per day for 45 years. She quit smokeless tobacco use about 3 years ago. She reports that she does not drink alcohol or use illicit drugs.  Family  History  Problem Relation Age of Onset  . Cancer Maternal Aunt     breast    Medications: Patient's Medications  New Prescriptions   No medications on file  Previous Medications   ACETAMINOPHEN (TYLENOL) 325 MG TABLET    Take 650 mg by mouth every 6 (six) hours as needed for pain.   ALBUTEROL (PROVENTIL) (2.5 MG/3ML) 0.083% NEBULIZER SOLUTION    Take 3 mLs (2.5 mg total) by nebulization every 4 (four) hours as needed for wheezing or shortness of breath.   ASPIRIN 81 MG CHEWABLE TABLET    Chew 81 mg by mouth daily.   BACLOFEN (LIORESAL) 10 MG TABLET    Take 5 mg by mouth 2 (two) times daily. 5 mg at 8a and 4pm   BACLOFEN (LIORESAL) 20 MG TABLET    Take 20 mg by mouth at bedtime.   CHOLECALCIFEROL (VITAMIN D) 1000 UNITS TABLET    Take 1,000 Units by mouth daily.   CLONAZEPAM (KLONOPIN) 0.5 MG TABLET    Take 0.5 tablets (0.25 mg total) by mouth 2 (two) times daily as needed for anxiety.   CLOPIDOGREL (PLAVIX) 75 MG TABLET    Take 75 mg by mouth daily.   DULOXETINE (CYMBALTA) 30 MG CAPSULE    Take 60 mg by mouth daily.   FERROUS SULFATE 325 (65 FE) MG TABLET    Take 325 mg by mouth.    GABAPENTIN (NEURONTIN) 300 MG CAPSULE    Take 300 mg by mouth at bedtime.   HYDRALAZINE (APRESOLINE) 25 MG TABLET    Take 1 tablet (25 mg total) by mouth every 8 (eight) hours.   INSULIN GLARGINE (LANTUS) 100 UNIT/ML INJECTION    Inject 12 Units into the skin at bedtime.    LORAZEPAM (ATIVAN) 0.5 MG TABLET    Take 0.5 mg by mouth at bedtime.   LUBIPROSTONE (AMITIZA) 24 MCG CAPSULE    Take 24 mcg by mouth 2 (two) times daily with a meal.   MAGNESIUM HYDROXIDE (MILK OF MAGNESIA) 800 MG/5ML SUSPENSION    Take 30 mLs by mouth daily as needed for constipation. 30cc   MENTHOL, TOPICAL ANALGESIC, (BIOFREEZE) 4 % GEL    To right ankle and bilateral knees TID   MENTHOL-CETYLPYRIDINIUM (CEPACOL) 3 MG LOZENGE    Take 1 lozenge by mouth every 4 (four) hours as needed for sore throat.   METFORMIN (GLUCOPHAGE) 500 MG TABLET     Take 1,000 mg by mouth 2 (two) times daily with a meal.    METHOCARBAMOL (ROBAXIN) 500 MG TABLET    Take 1 tablet (500 mg total) by mouth at bedtime.   MIDODRINE (PROAMATINE) 5 MG TABLET    Take 5 mg by mouth 3 (three) times daily.   POLYETHYLENE GLYCOL (MIRALAX / GLYCOLAX) PACKET    Take 17 g by mouth daily as needed for mild constipation.    PRAVASTATIN (PRAVACHOL) 40 MG TABLET  Take 40 mg by mouth daily.    SENNOSIDES-DOCUSATE SODIUM (SENOKOT-S) 8.6-50 MG TABLET    Take 1 tablet by mouth daily.   SOLIFENACIN (VESICARE) 5 MG TABLET    Take 5 mg by mouth daily.   TRAMADOL (ULTRAM) 50 MG TABLET    Take two tablets by mouth every 8 hours as needed for pain   WHEAT DEXTRIN (BENEFIBER) POWD    Take 1 Applicatorful by mouth daily. Mix with 8oz of fluid  Modified Medications   No medications on file  Discontinued Medications   LORAZEPAM (ATIVAN) 0.5 MG TABLET    Take 1/2 tablet by mouth every morning   MIRABEGRON ER (MYRBETRIQ) 25 MG TB24 TABLET    Take 1 tablet (25 mg total) by mouth daily.     Physical Exam: Filed Vitals:   06/30/14 1231  BP: 123/70  Pulse: 98  Temp: 98.7 F (37.1 C)  Resp: 18  Weight: 151 lb (68.493 kg)    Physical Exam  Constitutional: She is oriented to person, place, and time. She appears well-developed and well-nourished. No distress.  HENT:  Head: Normocephalic and atraumatic.  Neck: Normal range of motion. Neck supple.  Cardiovascular: Normal rate, regular rhythm and normal heart sounds.   Pulmonary/Chest: Effort normal and breath sounds normal. No respiratory distress.  Abdominal: Soft. Bowel sounds are normal. She exhibits no distension. There is no tenderness.  Genitourinary: Vagina normal. There is no rash, tenderness, lesion or injury on the right labia. There is no rash, tenderness, lesion or injury on the left labia.  Musculoskeletal: She exhibits tenderness (to right ankle with movement. no edema or redness noted. ). She exhibits no edema.    Right sided hemiparesis   Lymphadenopathy:    She has no cervical adenopathy.  Neurological: She is alert and oriented to person, place, and time.  Skin: Skin is warm and dry. She is not diaphoretic.  Psychiatric: She has a normal mood and affect.    Labs reviewed: Basic Metabolic Panel:  Recent Labs  02/12/14 1537 02/13/14 0419 02/14/14 0515  NA 139 139 138  K 5.3 4.6 4.2  CL 103 104 102  CO2 '25 22 21  ' GLUCOSE 128* 124* 141*  BUN 36* 29* 22  CREATININE 1.14* 0.93 0.75  CALCIUM 9.7 9.7 10.3   Liver Function Tests:  Recent Labs  02/12/14 0653  AST 33  ALT 31  ALKPHOS 79  BILITOT 0.5  PROT 7.5  ALBUMIN 3.6   No results for input(s): LIPASE, AMYLASE in the last 8760 hours. No results for input(s): AMMONIA in the last 8760 hours. CBC:  Recent Labs  02/12/14 0730 02/13/14 0419 02/14/14 0515  WBC 10.1 7.2 7.5  HGB 12.6 11.8* 13.5  HCT 39.2 36.4 41.7  MCV 94.2 92.6 91.6  PLT 175 173 225   TSH: No results for input(s): TSH in the last 8760 hours. A1C: Lab Results  Component Value Date   HGBA1C 5.3 04/05/2012   Lipid Panel: No results for input(s): CHOL, HDL, LDLCALC, TRIG, CHOLHDL, LDLDIRECT in the last 8760 hours. CBC with Diff    Result: 01/02/2014 3:28 PM   ( Status: F )     C WBC 6.2     4.0-10.5 K/uL SLN   RBC 4.08     3.87-5.11 MIL/uL SLN   Hemoglobin 12.1     12.0-15.0 g/dL SLN   Hematocrit 36.3     36.0-46.0 % SLN   MCV 89.0     78.0-100.0 fL SLN  MCH 29.7     26.0-34.0 pg SLN   MCHC 33.3     30.0-36.0 g/dL SLN   RDW 14.6     11.5-15.5 % SLN   Platelet Count 190     150-400 K/uL SLN   Granulocyte % 66     43-77 % SLN   Absolute Gran 4.1     1.7-7.7 K/uL SLN   Lymph % 24     12-46 % SLN   Absolute Lymph 1.5     0.7-4.0 K/uL SLN   Mono % 8     3-12 % SLN   Absolute Mono 0.5     0.1-1.0 K/uL SLN   Eos % 2     0-5 % SLN   Absolute Eos 0.1     0.0-0.7 K/uL SLN   Baso % 0     0-1 % SLN   Absolute Baso 0.0     0.0-0.1 K/uL SLN   Smear  Review Criteria for review not met  SLN   Comprehensive Metabolic Panel    Result: 01/02/2014 4:53 PM   ( Status: F )       Sodium 133   L 135-145 mEq/L SLN   Potassium 4.4     3.5-5.3 mEq/L SLN   Chloride 101     96-112 mEq/L SLN   CO2 24     19-32 mEq/L SLN   Glucose 226   H 70-99 mg/dL SLN   BUN 21     6-23 mg/dL SLN   Creatinine 0.79     0.50-1.10 mg/dL SLN   Bilirubin, Total 0.4     0.2-1.2 mg/dL SLN   Alkaline Phosphatase 68     39-117 U/L SLN   AST/SGOT 16     0-37 U/L SLN   ALT/SGPT 22     0-35 U/L SLN   Total Protein 6.4     6.0-8.3 g/dL SLN   Albumin 3.6     3.5-5.2 g/dL SLN   Calcium 9.4     8.4-10.5 mg/dL SLN   Hemoglobin A1C    Result: 01/02/2014 10:49 PM   ( Status: F )       Hemoglobin A1C 6.6   H <5.7 % SLN C Estimated Average Glucose 143   H <117 mg/dL SLN   Iron and IBC    Result: 03/20/2014 11:31 AM   ( Status: F )     C Iron 32   L 42-145 ug/dL SLN   UIBC 263     125-400 ug/dL SLN   TIBC 295     250-470 ug/dL SLN   %SAT 11   L 20-55 % SLN   CBC with Diff    Result: 03/20/2014 10:36 AM   ( Status: F )       WBC 6.5     4.0-10.5 K/uL SLN   RBC 3.06   L 3.87-5.11 MIL/uL SLN   Hemoglobin 9.2   L 12.0-15.0 g/dL SLN   Hematocrit 27.4   L 36.0-46.0 % SLN   MCV 89.5     78.0-100.0 fL SLN   MCH 30.1     26.0-34.0 pg SLN   MCHC 33.6     30.0-36.0 g/dL SLN   RDW 14.6     11.5-15.5 % SLN   Platelet Count 302     150-400 K/uL SLN   MPV 9.5     8.6-12.4 fL SLN C Granulocyte % 69     43-77 % SLN  Absolute Gran 4.5     1.7-7.7 K/uL SLN   Lymph % 20     12-46 % SLN   Absolute Lymph 1.3     0.7-4.0 K/uL SLN   Mono % 9     3-12 % SLN   Absolute Mono 0.6     0.1-1.0 K/uL SLN   Eos % 2     0-5 % SLN   Absolute Eos 0.1     0.0-0.7 K/uL SLN   Baso % 0     0-1 % SLN   Absolute Baso 0.0     0.0-0.1 K/uL SLN   Smear Review Criteria for review not met  SLN   Basic Metabolic Panel    Result: 03/20/2014 11:31 AM   ( Status: F )       Sodium 135     135-145 mEq/L SLN    Potassium 4.3     3.5-5.3 mEq/L SLN   Chloride 102     96-112 mEq/L SLN   CO2 24     19-32 mEq/L SLN   Glucose 106   H 70-99 mg/dL SLN   BUN 21     6-23 mg/dL SLN   Creatinine 0.96     0.50-1.10 mg/dL SLN   Calcium 9.6     8.4-10.5 mg/dL SLN   TSH    Result: 03/20/2014 12:34 PM   ( Status: F )       TSH 2.076     0.350-4.500 uIU/mL SLN   Ferritin    Result: 03/20/2014 12:34 PM   ( Status: F )       Ferritin 89     10-291 ng/mL SLN   Hemoglobin A1c with eAG    Result: 04/26/2014 9:46 PM   ( Status: F )     C Hemoglobin A1C 5.8   H <5.7 % SLN C Estimated Average Glucose 120   H <117 mg/dL SLN Assessment/Plan   1. DM (diabetes mellitus), type 2 with neurological complications Good control with current regimen, insurance requesting change from Lantus to Levemir, will change to levemir at same dose.  2. Spastic hemiplegia affecting dominant side unchanged. Using  a motorized wheelchair as well as walker with leg brace.  No worsening in pain or spasms reported  3. Generalized anxiety disorder -currently stable, followed by psych services with recent dose reduction of ativan with good effect.   4. Anemia, iron deficiency -currently taking iron with vit c for better absorption  -will follow up cbc  5. Late effects of CVA (cerebrovascular accident) - without change, conts on ASA  6. Osteoarthritis, unspecified osteoarthritis type, unspecified site - worsening pain in ankle, has follow up with orthopedic next week, to cont current PRNs  7. Overactive bladder -improvement with vesicare, will continue current regimen

## 2014-07-03 DIAGNOSIS — R1312 Dysphagia, oropharyngeal phase: Secondary | ICD-10-CM | POA: Diagnosis not present

## 2014-07-03 DIAGNOSIS — M6281 Muscle weakness (generalized): Secondary | ICD-10-CM | POA: Diagnosis not present

## 2014-07-03 DIAGNOSIS — M1812 Unilateral primary osteoarthritis of first carpometacarpal joint, left hand: Secondary | ICD-10-CM | POA: Diagnosis not present

## 2014-07-03 DIAGNOSIS — I959 Hypotension, unspecified: Secondary | ICD-10-CM | POA: Diagnosis not present

## 2014-07-03 DIAGNOSIS — I679 Cerebrovascular disease, unspecified: Secondary | ICD-10-CM | POA: Diagnosis not present

## 2014-07-03 DIAGNOSIS — I10 Essential (primary) hypertension: Secondary | ICD-10-CM | POA: Diagnosis not present

## 2014-07-03 DIAGNOSIS — G8191 Hemiplegia, unspecified affecting right dominant side: Secondary | ICD-10-CM | POA: Diagnosis not present

## 2014-07-04 DIAGNOSIS — M6281 Muscle weakness (generalized): Secondary | ICD-10-CM | POA: Diagnosis not present

## 2014-07-04 DIAGNOSIS — G8191 Hemiplegia, unspecified affecting right dominant side: Secondary | ICD-10-CM | POA: Diagnosis not present

## 2014-07-04 DIAGNOSIS — R1312 Dysphagia, oropharyngeal phase: Secondary | ICD-10-CM | POA: Diagnosis not present

## 2014-07-06 DIAGNOSIS — R1312 Dysphagia, oropharyngeal phase: Secondary | ICD-10-CM | POA: Diagnosis not present

## 2014-07-06 DIAGNOSIS — G8191 Hemiplegia, unspecified affecting right dominant side: Secondary | ICD-10-CM | POA: Diagnosis not present

## 2014-07-06 DIAGNOSIS — M6281 Muscle weakness (generalized): Secondary | ICD-10-CM | POA: Diagnosis not present

## 2014-07-07 DIAGNOSIS — R1312 Dysphagia, oropharyngeal phase: Secondary | ICD-10-CM | POA: Diagnosis not present

## 2014-07-07 DIAGNOSIS — M6281 Muscle weakness (generalized): Secondary | ICD-10-CM | POA: Diagnosis not present

## 2014-07-07 DIAGNOSIS — G8191 Hemiplegia, unspecified affecting right dominant side: Secondary | ICD-10-CM | POA: Diagnosis not present

## 2014-07-08 DIAGNOSIS — G8191 Hemiplegia, unspecified affecting right dominant side: Secondary | ICD-10-CM | POA: Diagnosis not present

## 2014-07-08 DIAGNOSIS — M6281 Muscle weakness (generalized): Secondary | ICD-10-CM | POA: Diagnosis not present

## 2014-07-08 DIAGNOSIS — R1312 Dysphagia, oropharyngeal phase: Secondary | ICD-10-CM | POA: Diagnosis not present

## 2014-07-09 DIAGNOSIS — G8191 Hemiplegia, unspecified affecting right dominant side: Secondary | ICD-10-CM | POA: Diagnosis not present

## 2014-07-09 DIAGNOSIS — M6281 Muscle weakness (generalized): Secondary | ICD-10-CM | POA: Diagnosis not present

## 2014-07-09 DIAGNOSIS — R1312 Dysphagia, oropharyngeal phase: Secondary | ICD-10-CM | POA: Diagnosis not present

## 2014-07-10 DIAGNOSIS — R1312 Dysphagia, oropharyngeal phase: Secondary | ICD-10-CM | POA: Diagnosis not present

## 2014-07-10 DIAGNOSIS — G8191 Hemiplegia, unspecified affecting right dominant side: Secondary | ICD-10-CM | POA: Diagnosis not present

## 2014-07-10 DIAGNOSIS — M6281 Muscle weakness (generalized): Secondary | ICD-10-CM | POA: Diagnosis not present

## 2014-07-12 DIAGNOSIS — F411 Generalized anxiety disorder: Secondary | ICD-10-CM | POA: Diagnosis not present

## 2014-07-12 DIAGNOSIS — F329 Major depressive disorder, single episode, unspecified: Secondary | ICD-10-CM | POA: Diagnosis not present

## 2014-07-12 DIAGNOSIS — R1312 Dysphagia, oropharyngeal phase: Secondary | ICD-10-CM | POA: Diagnosis not present

## 2014-07-12 DIAGNOSIS — M6281 Muscle weakness (generalized): Secondary | ICD-10-CM | POA: Diagnosis not present

## 2014-07-12 DIAGNOSIS — G8191 Hemiplegia, unspecified affecting right dominant side: Secondary | ICD-10-CM | POA: Diagnosis not present

## 2014-07-13 ENCOUNTER — Other Ambulatory Visit: Payer: Self-pay | Admitting: *Deleted

## 2014-07-13 DIAGNOSIS — G8191 Hemiplegia, unspecified affecting right dominant side: Secondary | ICD-10-CM | POA: Diagnosis not present

## 2014-07-13 DIAGNOSIS — M6281 Muscle weakness (generalized): Secondary | ICD-10-CM | POA: Diagnosis not present

## 2014-07-13 DIAGNOSIS — R1312 Dysphagia, oropharyngeal phase: Secondary | ICD-10-CM | POA: Diagnosis not present

## 2014-07-13 MED ORDER — LORAZEPAM 0.5 MG PO TABS: 0.5000 mg | ORAL_TABLET | Freq: Every day | ORAL | Status: DC

## 2014-07-13 NOTE — Telephone Encounter (Signed)
[facility] 

## 2014-07-14 DIAGNOSIS — R1312 Dysphagia, oropharyngeal phase: Secondary | ICD-10-CM | POA: Diagnosis not present

## 2014-07-14 DIAGNOSIS — M6281 Muscle weakness (generalized): Secondary | ICD-10-CM | POA: Diagnosis not present

## 2014-07-14 DIAGNOSIS — G8191 Hemiplegia, unspecified affecting right dominant side: Secondary | ICD-10-CM | POA: Diagnosis not present

## 2014-07-15 DIAGNOSIS — G8191 Hemiplegia, unspecified affecting right dominant side: Secondary | ICD-10-CM | POA: Diagnosis not present

## 2014-07-15 DIAGNOSIS — M6281 Muscle weakness (generalized): Secondary | ICD-10-CM | POA: Diagnosis not present

## 2014-07-15 DIAGNOSIS — R1312 Dysphagia, oropharyngeal phase: Secondary | ICD-10-CM | POA: Diagnosis not present

## 2014-07-17 DIAGNOSIS — R1312 Dysphagia, oropharyngeal phase: Secondary | ICD-10-CM | POA: Diagnosis not present

## 2014-07-17 DIAGNOSIS — M6281 Muscle weakness (generalized): Secondary | ICD-10-CM | POA: Diagnosis not present

## 2014-07-17 DIAGNOSIS — G8191 Hemiplegia, unspecified affecting right dominant side: Secondary | ICD-10-CM | POA: Diagnosis not present

## 2014-07-18 DIAGNOSIS — G8191 Hemiplegia, unspecified affecting right dominant side: Secondary | ICD-10-CM | POA: Diagnosis not present

## 2014-07-18 DIAGNOSIS — R1312 Dysphagia, oropharyngeal phase: Secondary | ICD-10-CM | POA: Diagnosis not present

## 2014-07-18 DIAGNOSIS — M6281 Muscle weakness (generalized): Secondary | ICD-10-CM | POA: Diagnosis not present

## 2014-07-19 DIAGNOSIS — G8191 Hemiplegia, unspecified affecting right dominant side: Secondary | ICD-10-CM | POA: Diagnosis not present

## 2014-07-19 DIAGNOSIS — M6281 Muscle weakness (generalized): Secondary | ICD-10-CM | POA: Diagnosis not present

## 2014-07-19 DIAGNOSIS — R1312 Dysphagia, oropharyngeal phase: Secondary | ICD-10-CM | POA: Diagnosis not present

## 2014-07-20 DIAGNOSIS — R1312 Dysphagia, oropharyngeal phase: Secondary | ICD-10-CM | POA: Diagnosis not present

## 2014-07-20 DIAGNOSIS — G8191 Hemiplegia, unspecified affecting right dominant side: Secondary | ICD-10-CM | POA: Diagnosis not present

## 2014-07-20 DIAGNOSIS — M6281 Muscle weakness (generalized): Secondary | ICD-10-CM | POA: Diagnosis not present

## 2014-07-21 DIAGNOSIS — R1312 Dysphagia, oropharyngeal phase: Secondary | ICD-10-CM | POA: Diagnosis not present

## 2014-07-21 DIAGNOSIS — M6281 Muscle weakness (generalized): Secondary | ICD-10-CM | POA: Diagnosis not present

## 2014-07-21 DIAGNOSIS — G8191 Hemiplegia, unspecified affecting right dominant side: Secondary | ICD-10-CM | POA: Diagnosis not present

## 2014-07-22 DIAGNOSIS — M6281 Muscle weakness (generalized): Secondary | ICD-10-CM | POA: Diagnosis not present

## 2014-07-22 DIAGNOSIS — G8191 Hemiplegia, unspecified affecting right dominant side: Secondary | ICD-10-CM | POA: Diagnosis not present

## 2014-07-22 DIAGNOSIS — R1312 Dysphagia, oropharyngeal phase: Secondary | ICD-10-CM | POA: Diagnosis not present

## 2014-07-23 DIAGNOSIS — G8191 Hemiplegia, unspecified affecting right dominant side: Secondary | ICD-10-CM | POA: Diagnosis not present

## 2014-07-23 DIAGNOSIS — R1312 Dysphagia, oropharyngeal phase: Secondary | ICD-10-CM | POA: Diagnosis not present

## 2014-07-23 DIAGNOSIS — M6281 Muscle weakness (generalized): Secondary | ICD-10-CM | POA: Diagnosis not present

## 2014-07-24 DIAGNOSIS — R1312 Dysphagia, oropharyngeal phase: Secondary | ICD-10-CM | POA: Diagnosis not present

## 2014-07-24 DIAGNOSIS — G8191 Hemiplegia, unspecified affecting right dominant side: Secondary | ICD-10-CM | POA: Diagnosis not present

## 2014-07-24 DIAGNOSIS — M6281 Muscle weakness (generalized): Secondary | ICD-10-CM | POA: Diagnosis not present

## 2014-07-25 DIAGNOSIS — R1312 Dysphagia, oropharyngeal phase: Secondary | ICD-10-CM | POA: Diagnosis not present

## 2014-07-25 DIAGNOSIS — G8191 Hemiplegia, unspecified affecting right dominant side: Secondary | ICD-10-CM | POA: Diagnosis not present

## 2014-07-25 DIAGNOSIS — M6281 Muscle weakness (generalized): Secondary | ICD-10-CM | POA: Diagnosis not present

## 2014-07-26 DIAGNOSIS — R1312 Dysphagia, oropharyngeal phase: Secondary | ICD-10-CM | POA: Diagnosis not present

## 2014-07-26 DIAGNOSIS — M6281 Muscle weakness (generalized): Secondary | ICD-10-CM | POA: Diagnosis not present

## 2014-07-26 DIAGNOSIS — G8191 Hemiplegia, unspecified affecting right dominant side: Secondary | ICD-10-CM | POA: Diagnosis not present

## 2014-07-27 DIAGNOSIS — G8191 Hemiplegia, unspecified affecting right dominant side: Secondary | ICD-10-CM | POA: Diagnosis not present

## 2014-07-27 DIAGNOSIS — R1312 Dysphagia, oropharyngeal phase: Secondary | ICD-10-CM | POA: Diagnosis not present

## 2014-07-27 DIAGNOSIS — M6281 Muscle weakness (generalized): Secondary | ICD-10-CM | POA: Diagnosis not present

## 2014-07-28 DIAGNOSIS — G8191 Hemiplegia, unspecified affecting right dominant side: Secondary | ICD-10-CM | POA: Diagnosis not present

## 2014-07-28 DIAGNOSIS — M6281 Muscle weakness (generalized): Secondary | ICD-10-CM | POA: Diagnosis not present

## 2014-07-28 DIAGNOSIS — R1312 Dysphagia, oropharyngeal phase: Secondary | ICD-10-CM | POA: Diagnosis not present

## 2014-07-29 DIAGNOSIS — M6281 Muscle weakness (generalized): Secondary | ICD-10-CM | POA: Diagnosis not present

## 2014-07-29 DIAGNOSIS — G8191 Hemiplegia, unspecified affecting right dominant side: Secondary | ICD-10-CM | POA: Diagnosis not present

## 2014-07-29 DIAGNOSIS — R1312 Dysphagia, oropharyngeal phase: Secondary | ICD-10-CM | POA: Diagnosis not present

## 2014-07-31 DIAGNOSIS — G8191 Hemiplegia, unspecified affecting right dominant side: Secondary | ICD-10-CM | POA: Diagnosis not present

## 2014-07-31 DIAGNOSIS — M6281 Muscle weakness (generalized): Secondary | ICD-10-CM | POA: Diagnosis not present

## 2014-07-31 DIAGNOSIS — R1312 Dysphagia, oropharyngeal phase: Secondary | ICD-10-CM | POA: Diagnosis not present

## 2014-08-01 ENCOUNTER — Non-Acute Institutional Stay (SKILLED_NURSING_FACILITY): Payer: Medicare Other | Admitting: Nurse Practitioner

## 2014-08-01 DIAGNOSIS — E1140 Type 2 diabetes mellitus with diabetic neuropathy, unspecified: Secondary | ICD-10-CM | POA: Diagnosis not present

## 2014-08-01 DIAGNOSIS — D509 Iron deficiency anemia, unspecified: Secondary | ICD-10-CM | POA: Diagnosis not present

## 2014-08-01 DIAGNOSIS — I6930 Unspecified sequelae of cerebral infarction: Secondary | ICD-10-CM

## 2014-08-01 DIAGNOSIS — G8110 Spastic hemiplegia affecting unspecified side: Secondary | ICD-10-CM

## 2014-08-01 DIAGNOSIS — I6990 Unspecified sequelae of unspecified cerebrovascular disease: Secondary | ICD-10-CM | POA: Diagnosis not present

## 2014-08-01 DIAGNOSIS — F411 Generalized anxiety disorder: Secondary | ICD-10-CM

## 2014-08-01 DIAGNOSIS — G8191 Hemiplegia, unspecified affecting right dominant side: Secondary | ICD-10-CM | POA: Diagnosis not present

## 2014-08-01 DIAGNOSIS — M6281 Muscle weakness (generalized): Secondary | ICD-10-CM | POA: Diagnosis not present

## 2014-08-01 DIAGNOSIS — E1149 Type 2 diabetes mellitus with other diabetic neurological complication: Secondary | ICD-10-CM

## 2014-08-01 DIAGNOSIS — K5900 Constipation, unspecified: Secondary | ICD-10-CM

## 2014-08-01 DIAGNOSIS — N3281 Overactive bladder: Secondary | ICD-10-CM | POA: Diagnosis not present

## 2014-08-01 DIAGNOSIS — R1312 Dysphagia, oropharyngeal phase: Secondary | ICD-10-CM | POA: Diagnosis not present

## 2014-08-01 NOTE — Progress Notes (Signed)
Patient ID: Chelsey Gallagher, female   DOB: November 19, 1950, 64 y.o.   MRN: [id number]    Nursing Home Location:  [place] of Service: SNF (31)  PCP: Hennie Duos, MD  Allergies  Allergen Reactions  . Codeine Other (See Comments)    Per Mar  . Flexeril [Cyclobenzaprine Hcl] Other (See Comments)    Per Mar  . Lyrica [Pregabalin] Other (See Comments)    "wires my up"    Chief Complaint  Patient presents with  . Medical Management of Chronic Issues    HPI:  Patient is a 64 y.o. female seen today at [place], seen today for routine follow up. She has a  pmh of CVA with spastic hemiplegia, constipation, anxiety, DM, HTN, and hyperlipidemia. Has been following with speech but yesterday was last day of therapy, no changes to diet or liquids. Pt reports her ex husband died last month, was having some increased anxiety due to this but it has improved. No worsening pain or spasms to right side. Occasional constipation, taking amitiza BID but reports she is probably not drinking enough water  Review of Systems:  Review of Systems  Constitutional: Negative for activity change, appetite change, fatigue and unexpected weight change.  HENT: Negative for congestion, hearing loss and sinus pressure.   Eyes: Negative.   Respiratory: Negative for cough and shortness of breath.   Cardiovascular: Negative for chest pain, palpitations and leg swelling.  Gastrointestinal: Positive for constipation (controlled on medications). Negative for abdominal pain and diarrhea.  Genitourinary: Positive for frequency (vesicare helps). Negative for dysuria, vaginal discharge, difficulty urinating and vaginal pain.  Musculoskeletal: Negative for arthralgias.  Skin: Negative for color change and wound.  Neurological: Negative for dizziness and weakness. Numbness: right side.  Psychiatric/Behavioral: Negative for behavioral problems, confusion and agitation.       Mood has  been stable    Past Medical History  Diagnosis Date  . Diabetes mellitus   . Hypertension   . PAD (peripheral artery disease)     S/p bypass grafting, unclear exactly where  . Nephrolithiasis   . Hyperlipidemia 05/14/2011  . Hearing loss   . Complication of anesthesia     doesn't wake up good- "usually end up in ICU" also experiences post op nausea and vomiting  . Orthostatic hypotension     after surgery.  Marland Kitchen Heart murmur     PCP- [facility]- No tx needed.  . Stroke 05/2011    Weakness on Right. Wears leg brace.  . Foot drop, left   . H/O hiatal hernia     second one  . Diabetic neuropathy     Diabetic neuropathy- has inproved since she quit smoking.  . Arthritis   . Depression   . Lung abnormality 05/11/2011   Past Surgical History  Procedure Laterality Date  . Total hip arthroplasty  2008    Right  . Abdominal hysterectomy  2003  . Cesarean section  1984  . Cholecystectomy  1984  . Bypass graft  2003    Abdominal aortic  . Kidney stone surgery  1999  . Appendectomy  2011  . Hernia repair  2011  . Lithotripsy    . Abdominal hysterectomy    . Esophagogastroduodenoscopy N/A 05/09/2013    Procedure: ESOPHAGOGASTRODUODENOSCOPY (EGD);  Surgeon: Lafayette Dragon, MD;  Location: [facility] ENDOSCOPY;  Service: Endoscopy;  Laterality: N/A;  . Colonoscopy N/A 05/10/2013    Procedure: COLONOSCOPY;  Surgeon:  Lafayette Dragon, MD;  Location: [facility] ENDOSCOPY;  Service: Endoscopy;  Laterality: N/A;   Social History:   reports that she has quit smoking. Her smoking use included Cigarettes. She smoked 0.00 packs per day for 45 years. She quit smokeless tobacco use about 3 years ago. She reports that she does not drink alcohol or use illicit drugs.  Family History  Problem Relation Age of Onset  . Cancer Maternal Aunt     breast    Medications: Patient's Medications  New Prescriptions   No medications on file  Previous Medications   ACETAMINOPHEN (TYLENOL) 325 MG TABLET    Take 650  mg by mouth every 6 (six) hours as needed for pain.   ALBUTEROL (PROVENTIL) (2.5 MG/3ML) 0.083% NEBULIZER SOLUTION    Take 3 mLs (2.5 mg total) by nebulization every 4 (four) hours as needed for wheezing or shortness of breath.   ASPIRIN 81 MG CHEWABLE TABLET    Chew 81 mg by mouth daily.   BACLOFEN (LIORESAL) 10 MG TABLET    Take 5 mg by mouth 2 (two) times daily. 5 mg at 8a and 4pm   BACLOFEN (LIORESAL) 20 MG TABLET    Take 20 mg by mouth at bedtime.   CHOLECALCIFEROL (VITAMIN D) 1000 UNITS TABLET    Take 1,000 Units by mouth daily.   CLONAZEPAM (KLONOPIN) 0.5 MG TABLET    Take 0.5 tablets (0.25 mg total) by mouth 2 (two) times daily as needed for anxiety.   CLOPIDOGREL (PLAVIX) 75 MG TABLET    Take 75 mg by mouth daily.   DULOXETINE (CYMBALTA) 30 MG CAPSULE    Take 60 mg by mouth daily.   FERROUS SULFATE 325 (65 FE) MG TABLET    Take 325 mg by mouth.    GABAPENTIN (NEURONTIN) 300 MG CAPSULE    Take 300 mg by mouth at bedtime.   HYDRALAZINE (APRESOLINE) 25 MG TABLET    Take 1 tablet (25 mg total) by mouth every 8 (eight) hours.   INSULIN DETEMIR (LEVEMIR) 100 UNIT/ML INJECTION    Inject 12 Units into the skin at bedtime.   LORAZEPAM (ATIVAN) 0.5 MG TABLET    Take 1 tablet (0.5 mg total) by mouth at bedtime. For anxiety   LUBIPROSTONE (AMITIZA) 24 MCG CAPSULE    Take 24 mcg by mouth 2 (two) times daily with a meal.   MAGNESIUM HYDROXIDE (MILK OF MAGNESIA) 800 MG/5ML SUSPENSION    Take 30 mLs by mouth daily as needed for constipation. 30cc   MENTHOL, TOPICAL ANALGESIC, (BIOFREEZE) 4 % GEL    To right ankle and bilateral knees TID   MENTHOL-CETYLPYRIDINIUM (CEPACOL) 3 MG LOZENGE    Take 1 lozenge by mouth every 4 (four) hours as needed for sore throat.   METFORMIN (GLUCOPHAGE) 500 MG TABLET    Take 1,000 mg by mouth 2 (two) times daily with a meal.    METHOCARBAMOL (ROBAXIN) 500 MG TABLET    Take 1 tablet (500 mg total) by mouth at bedtime.   MIDODRINE (PROAMATINE) 5 MG TABLET    Take 5 mg by  mouth 3 (three) times daily.   POLYETHYLENE GLYCOL (MIRALAX / GLYCOLAX) PACKET    Take 17 g by mouth daily as needed for mild constipation.    PRAVASTATIN (PRAVACHOL) 40 MG TABLET    Take 40 mg by mouth daily.    SENNOSIDES-DOCUSATE SODIUM (SENOKOT-S) 8.6-50 MG TABLET    Take 1 tablet by mouth daily.   SOLIFENACIN (VESICARE) 5 MG  TABLET    Take 5 mg by mouth daily.   TRAMADOL (ULTRAM) 50 MG TABLET    Take two tablets by mouth every 8 hours as needed for pain   WHEAT DEXTRIN (BENEFIBER) POWD    Take 1 Applicatorful by mouth daily. Mix with 8oz of fluid  Modified Medications   No medications on file  Discontinued Medications   INSULIN GLARGINE (LANTUS) 100 UNIT/ML INJECTION    Inject 12 Units into the skin at bedtime.      Physical Exam: Filed Vitals:   08/01/14 1351  BP: 135/75  Pulse: 77  Temp: 97.1 F (36.2 C)  Resp: 20  Weight: 145 lb (65.772 kg)    Physical Exam  Constitutional: She is oriented to person, place, and time. She appears well-developed and well-nourished. No distress.  HENT:  Head: Normocephalic and atraumatic.  Neck: Normal range of motion. Neck supple.  Cardiovascular: Normal rate, regular rhythm and normal heart sounds.   Pulmonary/Chest: Effort normal and breath sounds normal. No respiratory distress.  Abdominal: Soft. Bowel sounds are normal. She exhibits no distension. There is no tenderness.  Genitourinary: Vagina normal. There is no rash, tenderness, lesion or injury on the right labia. There is no rash, tenderness, lesion or injury on the left labia.  Musculoskeletal: She exhibits no edema or tenderness.  Right sided hemiparesis   Lymphadenopathy:    She has no cervical adenopathy.  Neurological: She is alert and oriented to person, place, and time.  Skin: Skin is warm and dry. She is not diaphoretic.  Psychiatric: She has a normal mood and affect.    Labs reviewed: Basic Metabolic Panel:  Recent Labs  02/12/14 1537 02/13/14 0419  02/14/14 0515  NA 139 139 138  K 5.3 4.6 4.2  CL 103 104 102  CO2 '25 22 21  ' GLUCOSE 128* 124* 141*  BUN 36* 29* 22  CREATININE 1.14* 0.93 0.75  CALCIUM 9.7 9.7 10.3   Liver Function Tests:  Recent Labs  02/12/14 0653  AST 33  ALT 31  ALKPHOS 79  BILITOT 0.5  PROT 7.5  ALBUMIN 3.6   No results for input(s): LIPASE, AMYLASE in the last 8760 hours. No results for input(s): AMMONIA in the last 8760 hours. CBC:  Recent Labs  02/12/14 0730 02/13/14 0419 02/14/14 0515  WBC 10.1 7.2 7.5  HGB 12.6 11.8* 13.5  HCT 39.2 36.4 41.7  MCV 94.2 92.6 91.6  PLT 175 173 225   TSH: No results for input(s): TSH in the last 8760 hours. A1C: Lab Results  Component Value Date   HGBA1C 5.3 04/05/2012   Lipid Panel: No results for input(s): CHOL, HDL, LDLCALC, TRIG, CHOLHDL, LDLDIRECT in the last 8760 hours. CBC with Diff    Result: 01/02/2014 3:28 PM   ( Status: F )     C WBC 6.2     4.0-10.5 K/uL SLN   RBC 4.08     3.87-5.11 MIL/uL SLN   Hemoglobin 12.1     12.0-15.0 g/dL SLN   Hematocrit 36.3     36.0-46.0 % SLN   MCV 89.0     78.0-100.0 fL SLN   MCH 29.7     26.0-34.0 pg SLN   MCHC 33.3     30.0-36.0 g/dL SLN   RDW 14.6     11.5-15.5 % SLN   Platelet Count 190     150-400 K/uL SLN   Granulocyte % 66     43-77 % SLN   Absolute Theodoro Parma  4.1     1.7-7.7 K/uL SLN   Lymph % 24     12-46 % SLN   Absolute Lymph 1.5     0.7-4.0 K/uL SLN   Mono % 8     3-12 % SLN   Absolute Mono 0.5     0.1-1.0 K/uL SLN   Eos % 2     0-5 % SLN   Absolute Eos 0.1     0.0-0.7 K/uL SLN   Baso % 0     0-1 % SLN   Absolute Baso 0.0     0.0-0.1 K/uL SLN   Smear Review Criteria for review not met  SLN   Comprehensive Metabolic Panel    Result: 01/02/2014 4:53 PM   ( Status: F )       Sodium 133   L 135-145 mEq/L SLN   Potassium 4.4     3.5-5.3 mEq/L SLN   Chloride 101     96-112 mEq/L SLN   CO2 24     19-32 mEq/L SLN   Glucose 226   H 70-99 mg/dL SLN   BUN 21     6-23 mg/dL SLN   Creatinine 0.79      0.50-1.10 mg/dL SLN   Bilirubin, Total 0.4     0.2-1.2 mg/dL SLN   Alkaline Phosphatase 68     39-117 U/L SLN   AST/SGOT 16     0-37 U/L SLN   ALT/SGPT 22     0-35 U/L SLN   Total Protein 6.4     6.0-8.3 g/dL SLN   Albumin 3.6     3.5-5.2 g/dL SLN   Calcium 9.4     8.4-10.5 mg/dL SLN   Hemoglobin A1C    Result: 01/02/2014 10:49 PM   ( Status: F )       Hemoglobin A1C 6.6   H <5.7 % SLN C Estimated Average Glucose 143   H <117 mg/dL SLN   Iron and IBC    Result: 03/20/2014 11:31 AM   ( Status: F )     C Iron 32   L 42-145 ug/dL SLN   UIBC 263     125-400 ug/dL SLN   TIBC 295     250-470 ug/dL SLN   %SAT 11   L 20-55 % SLN   CBC with Diff    Result: 03/20/2014 10:36 AM   ( Status: F )       WBC 6.5     4.0-10.5 K/uL SLN   RBC 3.06   L 3.87-5.11 MIL/uL SLN   Hemoglobin 9.2   L 12.0-15.0 g/dL SLN   Hematocrit 27.4   L 36.0-46.0 % SLN   MCV 89.5     78.0-100.0 fL SLN   MCH 30.1     26.0-34.0 pg SLN   MCHC 33.6     30.0-36.0 g/dL SLN   RDW 14.6     11.5-15.5 % SLN   Platelet Count 302     150-400 K/uL SLN   MPV 9.5     8.6-12.4 fL SLN C Granulocyte % 69     43-77 % SLN   Absolute Gran 4.5     1.7-7.7 K/uL SLN   Lymph % 20     12-46 % SLN   Absolute Lymph 1.3     0.7-4.0 K/uL SLN   Mono % 9     3-12 % SLN   Absolute Mono 0.6     0.1-1.0 K/uL SLN  Eos % 2     0-5 % SLN   Absolute Eos 0.1     0.0-0.7 K/uL SLN   Baso % 0     0-1 % SLN   Absolute Baso 0.0     0.0-0.1 K/uL SLN   Smear Review Criteria for review not met  SLN   Basic Metabolic Panel    Result: 03/20/2014 11:31 AM   ( Status: F )       Sodium 135     135-145 mEq/L SLN   Potassium 4.3     3.5-5.3 mEq/L SLN   Chloride 102     96-112 mEq/L SLN   CO2 24     19-32 mEq/L SLN   Glucose 106   H 70-99 mg/dL SLN   BUN 21     6-23 mg/dL SLN   Creatinine 0.96     0.50-1.10 mg/dL SLN   Calcium 9.6     8.4-10.5 mg/dL SLN   TSH    Result: 03/20/2014 12:34 PM   ( Status: F )       TSH 2.076     0.350-4.500 uIU/mL SLN    Ferritin    Result: 03/20/2014 12:34 PM   ( Status: F )       Ferritin 89     10-291 ng/mL SLN   Hemoglobin A1c with eAG    Result: 04/26/2014 9:46 PM   ( Status: F )     C Hemoglobin A1C 5.8   H <5.7 % SLN C Estimated Average Glucose 120   H <117 mg/dL SLN CBC with Diff    Result: 07/03/2014 3:58 PM   ( Status: F )     C WBC 7.6     4.0-10.5 K/uL SLN   RBC 3.34   L 3.87-5.11 MIL/uL SLN   Hemoglobin 9.4   L 12.0-15.0 g/dL SLN   Hematocrit 28.7   L 36.0-46.0 % SLN   MCV 85.9     78.0-100.0 fL SLN   MCH 28.1     26.0-34.0 pg SLN   MCHC 32.8     30.0-36.0 g/dL SLN   RDW 14.3     11.5-15.5 % SLN   Platelet Count 348     150-400 K/uL SLN   MPV 9.9     8.6-12.4 fL SLN   Granulocyte % 68     43-77 % SLN   Absolute Gran 5.2     1.7-7.7 K/uL SLN   Lymph % 22     12-46 % SLN   Absolute Lymph 1.7     0.7-4.0 K/uL SLN   Mono % 8     3-12 % SLN   Absolute Mono 0.6     0.1-1.0 K/uL SLN   Eos % 2     0-5 % SLN   Absolute Eos 0.2     0.0-0.7 K/uL SLN   Baso % 0     0-1 % SLN   Absolute Baso 0.0     0.0-0.1 K/uL SLN   Assessment/Plan  1. Constipation, unspecified constipation type -reports occasionally will not be controlled, will decrease fiber down to once daily, encouraged increase in water, will cont amitiza twice daily   2. DM (diabetes mellitus), type 2 with neurological complications -lantus changed to levemir due to formulary, fasting blood sugars ranging from 90s-120s occasionally wil be higher, A1c at goal in Feb  3. Spastic hemiplegia affecting dominant side Pt with frequent complaints of muscle spasms in the  past with pains to right side, currently stable on current regimen   4. Late effects of CVA (cerebrovascular accident) Without worsening of symptoms, conts on plavix  5. Generalized anxiety disorder -controlled on current regimen, still being followed by psych services  -anxiety and depression has been stable  6. Anemia, iron deficiency hgb stable, remains on iron  7.  Overactive bladder -conts on vesicare

## 2014-08-03 ENCOUNTER — Encounter: Payer: Self-pay | Admitting: Internal Medicine

## 2014-08-03 ENCOUNTER — Telehealth: Payer: Self-pay | Admitting: Neurology

## 2014-08-03 ENCOUNTER — Non-Acute Institutional Stay (SKILLED_NURSING_FACILITY): Payer: Medicare Other | Admitting: Internal Medicine

## 2014-08-03 DIAGNOSIS — F411 Generalized anxiety disorder: Secondary | ICD-10-CM

## 2014-08-03 NOTE — Telephone Encounter (Signed)
Patient called stating on Tuesday(08/01/14) when she awoke she had a twitch that occurred inside her right hand. Each time the twitch occurred she states her heart would stop. She is inquiring if she should see a cardiologist or Dr Leonie Man. Please call and advise. Patient can be reached at [phone].

## 2014-08-03 NOTE — Progress Notes (Addendum)
MRN: [id number] Name: Chelsey Gallagher  Sex: female Age: 64 y.o. DOB: Nov 05, 1950  [place] #: [name] Facility/Room: Level Of Care: SNF Provider: Inocencio Homes D Emergency Contacts: Extended Emergency Contact Information Primary Emergency Contact: [name] of [place] Phone: [phone] Mobile Phone: [phone] Relation: Daughter  Code Status: DNR  Allergies: Codeine; Flexeril; and Lyrica  Chief Complaint  Patient presents with  . Acute Visit    HPI: Patient is 64 y.o. female who I am seeing acutely because she had an incident she needs to share with me. Her neurologist told her to let me know when she spoke to himabout it.  Past Medical History  Diagnosis Date  . Diabetes mellitus   . Hypertension   . PAD (peripheral artery disease)     S/p bypass grafting, unclear exactly where  . Nephrolithiasis   . Hyperlipidemia 05/14/2011  . Hearing loss   . Complication of anesthesia     doesn't wake up good- "usually end up in ICU" also experiences post op nausea and vomiting  . Orthostatic hypotension     after surgery.  Marland Kitchen Heart murmur     PCP- [facility]- No tx needed.  . Stroke 05/2011    Weakness on Right. Wears leg brace.  . Foot drop, left   . H/O hiatal hernia     second one  . Diabetic neuropathy     Diabetic neuropathy- has inproved since she quit smoking.  . Arthritis   . Depression   . Lung abnormality 05/11/2011    Past Surgical History  Procedure Laterality Date  . Total hip arthroplasty  2008    Right  . Abdominal hysterectomy  2003  . Cesarean section  1984  . Cholecystectomy  1984  . Bypass graft  2003    Abdominal aortic  . Kidney stone surgery  1999  . Appendectomy  2011  . Hernia repair  2011  . Lithotripsy    . Abdominal hysterectomy    . Esophagogastroduodenoscopy N/A 05/09/2013    Procedure: ESOPHAGOGASTRODUODENOSCOPY (EGD);  Surgeon: Lafayette Dragon, MD;  Location: [facility] ENDOSCOPY;  Service: Endoscopy;   Laterality: N/A;  . Colonoscopy N/A 05/10/2013    Procedure: COLONOSCOPY;  Surgeon: Lafayette Dragon, MD;  Location: [facility] ENDOSCOPY;  Service: Endoscopy;  Laterality: N/A;      Medication List       This list is accurate as of: 08/03/14 11:59 PM.  Always use your most recent med list.               acetaminophen 325 MG tablet  Commonly known as:  TYLENOL  Take 650 mg by mouth every 6 (six) hours as needed for pain.     albuterol (2.5 MG/3ML) 0.083% nebulizer solution  Commonly known as:  PROVENTIL  Take 3 mLs (2.5 mg total) by nebulization every 4 (four) hours as needed for wheezing or shortness of breath.     aspirin 81 MG chewable tablet  Chew 81 mg by mouth daily.     baclofen 20 MG tablet  Commonly known as:  LIORESAL  Take 20 mg by mouth at bedtime.     baclofen 10 MG tablet  Commonly known as:  LIORESAL  Take 5 mg by mouth 2 (two) times daily. 5 mg at 8a and 4pm     BENEFIBER Powd  Take 1 Applicatorful by mouth daily. Mix with 8oz of fluid     cholecalciferol 1000 UNITS tablet  Commonly known as:  VITAMIN D  Take 1,000 Units by mouth daily.     clonazePAM 0.5 MG tablet  Commonly known as:  KLONOPIN  Take 0.5 tablets (0.25 mg total) by mouth 2 (two) times daily as needed for anxiety.     clopidogrel 75 MG tablet  Commonly known as:  PLAVIX  Take 75 mg by mouth daily.     DULoxetine 30 MG capsule  Commonly known as:  CYMBALTA  Take 60 mg by mouth daily.     ferrous sulfate 325 (65 FE) MG tablet  Take 325 mg by mouth.     gabapentin 300 MG capsule  Commonly known as:  NEURONTIN  Take 300 mg by mouth at bedtime.     hydrALAZINE 25 MG tablet  Commonly known as:  APRESOLINE  Take 1 tablet (25 mg total) by mouth every 8 (eight) hours.     insulin detemir 100 UNIT/ML injection  Commonly known as:  LEVEMIR  Inject 12 Units into the skin at bedtime.     LORazepam 0.5 MG tablet  Commonly known as:  ATIVAN  Take 1 tablet (0.5 mg total) by mouth at bedtime. For  anxiety     lubiprostone 24 MCG capsule  Commonly known as:  AMITIZA  Take 24 mcg by mouth 2 (two) times daily with a meal.     magnesium hydroxide 800 MG/5ML suspension  Commonly known as:  MILK OF MAGNESIA  Take 30 mLs by mouth daily as needed for constipation. 30cc     Menthol (Topical Analgesic) 4 % Gel  Commonly known as:  BIOFREEZE  To right ankle and bilateral knees TID     menthol-cetylpyridinium 3 MG lozenge  Commonly known as:  CEPACOL  Take 1 lozenge by mouth every 4 (four) hours as needed for sore throat.     metFORMIN 500 MG tablet  Commonly known as:  GLUCOPHAGE  Take 1,000 mg by mouth 2 (two) times daily with a meal.     methocarbamol 500 MG tablet  Commonly known as:  ROBAXIN  Take 1 tablet (500 mg total) by mouth at bedtime.     midodrine 5 MG tablet  Commonly known as:  PROAMATINE  Take 5 mg by mouth 3 (three) times daily.     polyethylene glycol packet  Commonly known as:  MIRALAX / GLYCOLAX  Take 17 g by mouth daily as needed for mild constipation.     pravastatin 40 MG tablet  Commonly known as:  PRAVACHOL  Take 40 mg by mouth daily.     sennosides-docusate sodium 8.6-50 MG tablet  Commonly known as:  SENOKOT-S  Take 1 tablet by mouth daily.     solifenacin 5 MG tablet  Commonly known as:  VESICARE  Take 5 mg by mouth daily.     traMADol 50 MG tablet  Commonly known as:  ULTRAM  Take two tablets by mouth every 8 hours as needed for pain        No orders of the defined types were placed in this encounter.    Immunization History  Administered Date(s) Administered  . Influenza-Unspecified 12/04/2011  . Pneumococcal Polysaccharide-23 09/03/2011  . Pneumococcal-Unspecified 12/02/2010    History  Substance Use Topics  . Smoking status: Former Smoker -- 0.00 packs/day for 45 years    Types: Cigarettes  . Smokeless tobacco: Former Systems developer    Quit date: 05/08/2011  . Alcohol Use: No    Review of Systems  DATA OBTAINED: from patient -  Pt says  one morning she felt a tapping on her feet, the way her father used to do to wake her in the am for school. She then had a sharp pain in her R arm that lasted for just an instant; she then felt like her heart pause for less than a second. She was feeling sleepy when this happened but felt OK when she got up and dressed GENERAL:  no fevers, fatigue, appetite changes SKIN: No itching, rash HEENT: No complaint RESPIRATORY: No cough, wheezing, SOB CARDIAC: No chest pain, palpitations, lower extremity edema; she has a heart murmur  GI: No abdominal pain, No N/V/D or constipation, No heartburn or reflux  GU: No dysuria, frequency or urgency, or incontinence  MUSCULOSKELETAL: No unrelieved bone/joint pain NEUROLOGIC: No headache, dizziness  PSYCHIATRIC: No overt anxiety or sadness  Filed Vitals:   08/03/14 1501  BP: 134/71  Pulse: 85  Temp: 97 F (36.1 C)  Resp: 18    Physical Exam  GENERAL APPEARANCE: Alert, conversant, No acute distress  SKIN: No diaphoresis rash, or wounds HEENT: Unremarkable RESPIRATORY: Breathing is even, unlabored. Lung sounds are clear   CARDIOVASCULAR: Heart RRR 3/6 systolic  murmur, no rubs or gallops, occ PVC;  No peripheral edema  GASTROINTESTINAL: Abdomen is soft, non-tender, not distended w/ normal bowel sounds.  GENITOURINARY: Bladder non tender, not distended  MUSCULOSKELETAL: R side contractures NEUROLOGIC: Cranial nerves 2-12 grossly intact;R side paresis PSYCHIATRIC: Mood and affect appropriate to situation, no behavioral issues  Patient Active Problem List   Diagnosis Date Noted  . Overactive bladder 08/01/2014  . OA (osteoarthritis) 05/08/2014  . Contracture of elbow joint 05/08/2014  . Contracture of right wrist joint 05/08/2014  . Contracture of joint of finger of right hand 05/08/2014  . Generalized anxiety disorder 08/26/2013  . Late effects of CVA (cerebrovascular accident) 07/08/2013  . Depression   . Hypotension 05/08/2013  .  Fecal occult blood test positive 05/08/2013  . Altered mental status 05/08/2013  . Insomnia 04/26/2013  . UTI (urinary tract infection) 07/02/2012  . Anemia, iron deficiency 07/02/2012  . Constipation 07/02/2012  . Carotid stenosis 04/13/2012  . Cerebral artery occlusion with cerebral infarction 04/13/2012  . Orthostatic hypotension 04/06/2012  . Abnormal TSH 04/06/2012  . Spastic hemiplegia affecting dominant side 07/25/2011  . Stroke 05/15/2011  . Hyperlipidemia 05/14/2011  . Tobacco abuse 05/14/2011  . CVA (cerebral infarction) 05/08/2011  . Hypertension   . DM (diabetes mellitus), type 2 with neurological complications     CBC    Component Value Date/Time   WBC 7.5 02/14/2014 0515   RBC 4.55 02/14/2014 0515   RBC 2.99* 04/05/2012 0345   HGB 13.5 02/14/2014 0515   HCT 41.7 02/14/2014 0515   PLT 225 02/14/2014 0515   MCV 91.6 02/14/2014 0515   LYMPHSABS 2.0 05/07/2013 2223   MONOABS 0.6 05/07/2013 2223   EOSABS 0.1 05/07/2013 2223   BASOSABS 0.0 05/07/2013 2223    CMP     Component Value Date/Time   NA 138 02/14/2014 0515   K 4.2 02/14/2014 0515   CL 102 02/14/2014 0515   CO2 21 02/14/2014 0515   GLUCOSE 141* 02/14/2014 0515   BUN 22 02/14/2014 0515   CREATININE 0.75 02/14/2014 0515   CALCIUM 10.3 02/14/2014 0515   PROT 7.5 02/12/2014 0653   ALBUMIN 3.6 02/12/2014 0653   AST 33 02/12/2014 0653   ALT 31 02/12/2014 0653   ALKPHOS 79 02/12/2014 0653   BILITOT 0.5 02/12/2014 0653   GFRNONAA 89* 02/14/2014  0515   GFRAA >90 02/14/2014 0515    Assessment and Plan  Generalized anxiety disorder After pt recounted her story I reviewed her chart for history and any recent interventions, medications or lab abnormalities.There was nothing.Her experience does not appear serious. 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     Hennie Duos, MD

## 2014-08-03 NOTE — Telephone Encounter (Signed)
Spoke with Judeen Hammans, nurse at [facility]. She cares for patient. She states the patient mentioned her hand tingling on Tues but after it was explained to her that due to her stroke and taking PT, she may periodically experience tingling, the patient "went on about her day". She states Ms Kakos has not verbalized any other c/o since then. She states that the NP and Dr Sheppard Coil follow her closely and frequently. Judeen Hammans further stated that Ms Podoll  c/o about numerous issues often but on exam has normal VS, and is not found to have any symptoms/new problems that would indicate stroke. Informed Judeen Hammans this RN will call pt and advise she alert staff for any problem, request to see NP or Dr Sheppard Coil.   1:50 pm  Spoke with pt and informed her to alert staff and request to be evaluated by NP or Dr Sheppard Coil for problems. Advised her that they would be able to determine if she then needed to see a specialist. Ms Whitwell verbalized understanding, agreement and appreciation for this call.

## 2014-08-05 ENCOUNTER — Encounter: Payer: Self-pay | Admitting: Internal Medicine

## 2014-08-05 DIAGNOSIS — Z79899 Other long term (current) drug therapy: Secondary | ICD-10-CM | POA: Diagnosis not present

## 2014-08-05 NOTE — Assessment & Plan Note (Addendum)
After pt recounted her story I reviewed her chart for history and any recent interventions, medications or lab abnormalities.There was nothing.Her experience does not appear serious. 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

## 2014-08-11 ENCOUNTER — Non-Acute Institutional Stay (SKILLED_NURSING_FACILITY): Payer: Medicare Other | Admitting: Internal Medicine

## 2014-08-11 DIAGNOSIS — L989 Disorder of the skin and subcutaneous tissue, unspecified: Secondary | ICD-10-CM

## 2014-08-11 DIAGNOSIS — R238 Other skin changes: Secondary | ICD-10-CM

## 2014-08-11 NOTE — Progress Notes (Signed)
MRN: [id number] Name: Chelsey Gallagher  Sex: female Age: 64 y.o. DOB: 1951/01/14  [place] #: [name] Facility/Room: Level Of Care: SNF Provider: Inocencio Homes D Emergency Contacts: Extended Emergency Contact Information Primary Emergency Contact: [name] of [place] Phone: [phone] Mobile Phone: [phone] Relation: Daughter  Code Status: DNR  Allergies: Codeine; Flexeril; and Lyrica  Chief Complaint  Patient presents with  . Acute Visit    HPI: Patient is 64 y.o. female who has asked to see me for irritation to L eye.  Past Medical History  Diagnosis Date  . Diabetes mellitus   . Hypertension   . PAD (peripheral artery disease)     S/p bypass grafting, unclear exactly where  . Nephrolithiasis   . Hyperlipidemia 05/14/2011  . Hearing loss   . Complication of anesthesia     doesn't wake up good- "usually end up in ICU" also experiences post op nausea and vomiting  . Orthostatic hypotension     after surgery.  Marland Kitchen Heart murmur     PCP- [facility]- No tx needed.  . Stroke 05/2011    Weakness on Right. Wears leg brace.  . Foot drop, left   . H/O hiatal hernia     second one  . Diabetic neuropathy     Diabetic neuropathy- has inproved since she quit smoking.  . Arthritis   . Depression   . Lung abnormality 05/11/2011    Past Surgical History  Procedure Laterality Date  . Total hip arthroplasty  2008    Right  . Abdominal hysterectomy  2003  . Cesarean section  1984  . Cholecystectomy  1984  . Bypass graft  2003    Abdominal aortic  . Kidney stone surgery  1999  . Appendectomy  2011  . Hernia repair  2011  . Lithotripsy    . Abdominal hysterectomy    . Esophagogastroduodenoscopy N/A 05/09/2013    Procedure: ESOPHAGOGASTRODUODENOSCOPY (EGD);  Surgeon: Lafayette Dragon, MD;  Location: [facility] ENDOSCOPY;  Service: Endoscopy;  Laterality: N/A;  . Colonoscopy N/A 05/10/2013    Procedure: COLONOSCOPY;  Surgeon: Lafayette Dragon, MD;   Location: [facility] ENDOSCOPY;  Service: Endoscopy;  Laterality: N/A;      Medication List       This list is accurate as of: 08/11/14 11:59 PM.  Always use your most recent med list.               acetaminophen 325 MG tablet  Commonly known as:  TYLENOL  Take 650 mg by mouth every 6 (six) hours as needed for pain.     albuterol (2.5 MG/3ML) 0.083% nebulizer solution  Commonly known as:  PROVENTIL  Take 3 mLs (2.5 mg total) by nebulization every 4 (four) hours as needed for wheezing or shortness of breath.     aspirin 81 MG chewable tablet  Chew 81 mg by mouth daily.     baclofen 20 MG tablet  Commonly known as:  LIORESAL  Take 20 mg by mouth at bedtime.     baclofen 10 MG tablet  Commonly known as:  LIORESAL  Take 5 mg by mouth 2 (two) times daily. 5 mg at 8a and 4pm     BENEFIBER Powd  Take 1 Applicatorful by mouth daily. Mix with 8oz of fluid     cholecalciferol 1000 UNITS tablet  Commonly known as:  VITAMIN D  Take 1,000 Units by mouth daily.     clonazePAM 0.5 MG tablet  Commonly  known as:  KLONOPIN  Take 0.5 tablets (0.25 mg total) by mouth 2 (two) times daily as needed for anxiety.     clopidogrel 75 MG tablet  Commonly known as:  PLAVIX  Take 75 mg by mouth daily.     DULoxetine 30 MG capsule  Commonly known as:  CYMBALTA  Take 60 mg by mouth daily.     ferrous sulfate 325 (65 FE) MG tablet  Take 325 mg by mouth.     gabapentin 300 MG capsule  Commonly known as:  NEURONTIN  Take 300 mg by mouth at bedtime.     hydrALAZINE 25 MG tablet  Commonly known as:  APRESOLINE  Take 1 tablet (25 mg total) by mouth every 8 (eight) hours.     insulin detemir 100 UNIT/ML injection  Commonly known as:  LEVEMIR  Inject 12 Units into the skin at bedtime.     LORazepam 0.5 MG tablet  Commonly known as:  ATIVAN  Take 1 tablet (0.5 mg total) by mouth at bedtime. For anxiety     lubiprostone 24 MCG capsule  Commonly known as:  AMITIZA  Take 24 mcg by mouth 2 (two)  times daily with a meal.     magnesium hydroxide 800 MG/5ML suspension  Commonly known as:  MILK OF MAGNESIA  Take 30 mLs by mouth daily as needed for constipation. 30cc     Menthol (Topical Analgesic) 4 % Gel  Commonly known as:  BIOFREEZE  To right ankle and bilateral knees TID     menthol-cetylpyridinium 3 MG lozenge  Commonly known as:  CEPACOL  Take 1 lozenge by mouth every 4 (four) hours as needed for sore throat.     metFORMIN 500 MG tablet  Commonly known as:  GLUCOPHAGE  Take 1,000 mg by mouth 2 (two) times daily with a meal.     methocarbamol 500 MG tablet  Commonly known as:  ROBAXIN  Take 1 tablet (500 mg total) by mouth at bedtime.     midodrine 5 MG tablet  Commonly known as:  PROAMATINE  Take 5 mg by mouth 3 (three) times daily.     polyethylene glycol packet  Commonly known as:  MIRALAX / GLYCOLAX  Take 17 g by mouth daily as needed for mild constipation.     pravastatin 40 MG tablet  Commonly known as:  PRAVACHOL  Take 40 mg by mouth daily.     sennosides-docusate sodium 8.6-50 MG tablet  Commonly known as:  SENOKOT-S  Take 1 tablet by mouth daily.     solifenacin 5 MG tablet  Commonly known as:  VESICARE  Take 5 mg by mouth daily.     traMADol 50 MG tablet  Commonly known as:  ULTRAM  Take two tablets by mouth every 8 hours as needed for pain        No orders of the defined types were placed in this encounter.    Immunization History  Administered Date(s) Administered  . Influenza-Unspecified 12/04/2011  . Pneumococcal Polysaccharide-23 09/03/2011  . Pneumococcal-Unspecified 12/02/2010    History  Substance Use Topics  . Smoking status: Former Smoker -- 0.00 packs/day for 45 years    Types: Cigarettes  . Smokeless tobacco: Former Systems developer    Quit date: 05/08/2011  . Alcohol Use: No    Review of Systems  DATA OBTAINED: from patient GENERAL:  no fevers, fatigue, appetite changes SKIN: No itching, rash HEENT: c/o irritation to L eye  on corner, with some small  drainage in am;no redness to actual eyeball or d/c form same RESPIRATORY: No cough, wheezing, SOB CARDIAC: No chest pain, palpitations, lower extremity edema  GI: No abdominal pain, No N/V/D or constipation, No heartburn or reflux  GU: No dysuria, frequency or urgency, or incontinence  MUSCULOSKELETAL: No unrelieved bone/joint pain NEUROLOGIC: No headache, dizziness  PSYCHIATRIC: No overt anxiety or sadness  Filed Vitals:   08/12/14 1904  BP: 134/78  Pulse: 79  Temp: 97.1 F (36.2 C)  Resp: 19    Physical Exam  GENERAL APPEARANCE: Alert, conversant, No acute distress  SKIN: pt pointed out exact spot just lateral to lateral canthus; there is no redness swelling or open skin HEENT:conjunctiva without redness, no swelling, no d/c RESPIRATORY: Breathing is even, unlabored  CARDIOVASCULAR:  No peripheral edema  GASTROINTESTINAL: Abdomen is not distended GENITOURINARY: Bladder  not distended  MUSCULOSKELETAL: contractures RUE NEUROLOGIC: Cranial nerves 2-12 grossly intact; R side weakness PSYCHIATRIC: Mood and affect appropriate to situation, no behavioral issues  Patient Active Problem List   Diagnosis Date Noted  . Overactive bladder 08/01/2014  . OA (osteoarthritis) 05/08/2014  . Contracture of elbow joint 05/08/2014  . Contracture of right wrist joint 05/08/2014  . Contracture of joint of finger of right hand 05/08/2014  . Generalized anxiety disorder 08/26/2013  . Late effects of CVA (cerebrovascular accident) 07/08/2013  . Depression   . Hypotension 05/08/2013  . Fecal occult blood test positive 05/08/2013  . Altered mental status 05/08/2013  . Insomnia 04/26/2013  . UTI (urinary tract infection) 07/02/2012  . Anemia, iron deficiency 07/02/2012  . Constipation 07/02/2012  . Carotid stenosis 04/13/2012  . Cerebral artery occlusion with cerebral infarction 04/13/2012  . Orthostatic hypotension 04/06/2012  . Abnormal TSH 04/06/2012  .  Spastic hemiplegia affecting dominant side 07/25/2011  . Stroke 05/15/2011  . Hyperlipidemia 05/14/2011  . Tobacco abuse 05/14/2011  . CVA (cerebral infarction) 05/08/2011  . Hypertension   . DM (diabetes mellitus), type 2 with neurological complications     CBC    Component Value Date/Time   WBC 7.5 02/14/2014 0515   RBC 4.55 02/14/2014 0515   RBC 2.99* 04/05/2012 0345   HGB 13.5 02/14/2014 0515   HCT 41.7 02/14/2014 0515   PLT 225 02/14/2014 0515   MCV 91.6 02/14/2014 0515   LYMPHSABS 2.0 05/07/2013 2223   MONOABS 0.6 05/07/2013 2223   EOSABS 0.1 05/07/2013 2223   BASOSABS 0.0 05/07/2013 2223    CMP     Component Value Date/Time   NA 138 02/14/2014 0515   K 4.2 02/14/2014 0515   CL 102 02/14/2014 0515   CO2 21 02/14/2014 0515   GLUCOSE 141* 02/14/2014 0515   BUN 22 02/14/2014 0515   CREATININE 0.75 02/14/2014 0515   CALCIUM 10.3 02/14/2014 0515   PROT 7.5 02/12/2014 0653   ALBUMIN 3.6 02/12/2014 0653   AST 33 02/12/2014 0653   ALT 31 02/12/2014 0653   ALKPHOS 79 02/12/2014 0653   BILITOT 0.5 02/12/2014 0653   GFRNONAA 89* 02/14/2014 0515   GFRAA >90 02/14/2014 0515    Assessment and Plan  Skin irritation skin lateral to lateral canthus L eye - reassurance and neosporin TID until resolved  Hennie Duos, MD

## 2014-08-12 ENCOUNTER — Encounter: Payer: Self-pay | Admitting: Internal Medicine

## 2014-08-25 DIAGNOSIS — M1812 Unilateral primary osteoarthritis of first carpometacarpal joint, left hand: Secondary | ICD-10-CM | POA: Diagnosis not present

## 2014-09-08 ENCOUNTER — Non-Acute Institutional Stay (SKILLED_NURSING_FACILITY): Payer: Medicare Other | Admitting: Nurse Practitioner

## 2014-09-08 DIAGNOSIS — E1149 Type 2 diabetes mellitus with other diabetic neurological complication: Secondary | ICD-10-CM

## 2014-09-08 DIAGNOSIS — N3281 Overactive bladder: Secondary | ICD-10-CM

## 2014-09-08 DIAGNOSIS — I959 Hypotension, unspecified: Secondary | ICD-10-CM | POA: Diagnosis not present

## 2014-09-08 DIAGNOSIS — I10 Essential (primary) hypertension: Secondary | ICD-10-CM

## 2014-09-08 DIAGNOSIS — K5900 Constipation, unspecified: Secondary | ICD-10-CM | POA: Diagnosis not present

## 2014-09-08 DIAGNOSIS — D509 Iron deficiency anemia, unspecified: Secondary | ICD-10-CM

## 2014-09-08 DIAGNOSIS — I6990 Unspecified sequelae of unspecified cerebrovascular disease: Secondary | ICD-10-CM | POA: Diagnosis not present

## 2014-09-08 DIAGNOSIS — E1140 Type 2 diabetes mellitus with diabetic neuropathy, unspecified: Secondary | ICD-10-CM | POA: Diagnosis not present

## 2014-09-08 NOTE — Progress Notes (Signed)
Patient ID: Chelsey Gallagher, female   DOB: 08-05-1950, 64 y.o.   MRN: [id number]    Nursing Home Location:  [place] of Service: SNF (31)  PCP: Hennie Duos, MD  Allergies  Allergen Reactions  . Codeine Other (See Comments)    Per Mar  . Flexeril [Cyclobenzaprine Hcl] Other (See Comments)    Per Mar  . Lyrica [Pregabalin] Other (See Comments)    "wires my up"    Chief Complaint  Patient presents with  . Medical Management of Chronic Issues    HPI:  Patient is a 64 y.o. female seen today at [employer], seen today for routine follow up. She has a  pmh of CVA with spastic hemiplegia, constipation, anxiety, DM, HTN, and hyperlipidemia.  Pt has been stable in the last month, has some skin irritation to skin lateral to eye, this has resolved.  Otherwise reports doing well, reports constipation is well controlled but when it is not notices burning with urination. Currently not as issue. Mood has been good, no increase in anxiety or depression No worsening pain or muscle spasm Review of Systems:  Review of Systems  Constitutional: Negative for activity change, appetite change, fatigue and unexpected weight change.  HENT: Negative for congestion, hearing loss and sinus pressure.   Eyes: Negative.   Respiratory: Negative for cough and shortness of breath.   Cardiovascular: Negative for chest pain, palpitations and leg swelling.  Gastrointestinal: Positive for constipation (controlled on medications). Negative for abdominal pain and diarrhea.  Genitourinary: Positive for frequency (vesicare helps). Negative for dysuria, vaginal discharge, difficulty urinating and vaginal pain.  Musculoskeletal: Negative for arthralgias.  Skin: Negative for color change and wound.  Neurological: Negative for dizziness and weakness. Numbness: right side.  Psychiatric/Behavioral: Negative for behavioral problems, confusion and agitation.       Mood has been  stable    Past Medical History  Diagnosis Date  . Diabetes mellitus   . Hypertension   . PAD (peripheral artery disease)     S/p bypass grafting, unclear exactly where  . Nephrolithiasis   . Hyperlipidemia 05/14/2011  . Hearing loss   . Complication of anesthesia     doesn't wake up good- "usually end up in ICU" also experiences post op nausea and vomiting  . Orthostatic hypotension     after surgery.  Marland Kitchen Heart murmur     PCP- [facility]- No tx needed.  . Stroke 05/2011    Weakness on Right. Wears leg brace.  . Foot drop, left   . H/O hiatal hernia     second one  . Diabetic neuropathy     Diabetic neuropathy- has inproved since she quit smoking.  . Arthritis   . Depression   . Lung abnormality 05/11/2011   Past Surgical History  Procedure Laterality Date  . Total hip arthroplasty  2008    Right  . Abdominal hysterectomy  2003  . Cesarean section  1984  . Cholecystectomy  1984  . Bypass graft  2003    Abdominal aortic  . Kidney stone surgery  1999  . Appendectomy  2011  . Hernia repair  2011  . Lithotripsy    . Abdominal hysterectomy    . Esophagogastroduodenoscopy N/A 05/09/2013    Procedure: ESOPHAGOGASTRODUODENOSCOPY (EGD);  Surgeon: Lafayette Dragon, MD;  Location: [facility] ENDOSCOPY;  Service: Endoscopy;  Laterality: N/A;  . Colonoscopy N/A 05/10/2013    Procedure: COLONOSCOPY;  Surgeon: Sydell Axon  Andris Baumann, MD;  Location: [place];  Service: Endoscopy;  Laterality: N/A;   Social History:   reports that she has quit smoking. Her smoking use included Cigarettes. She smoked 0.00 packs per day for 45 years. She quit smokeless tobacco use about 3 years ago. She reports that she does not drink alcohol or use illicit drugs.  Family History  Problem Relation Age of Onset  . Cancer Maternal Aunt     breast    Medications: Patient's Medications  New Prescriptions   No medications on file  Previous Medications   ACETAMINOPHEN (TYLENOL) 325 MG TABLET    Take 650 mg by  mouth every 6 (six) hours as needed for pain.   ALBUTEROL (PROVENTIL) (2.5 MG/3ML) 0.083% NEBULIZER SOLUTION    Take 3 mLs (2.5 mg total) by nebulization every 4 (four) hours as needed for wheezing or shortness of breath.   ASPIRIN 81 MG CHEWABLE TABLET    Chew 81 mg by mouth daily.   BACLOFEN (LIORESAL) 10 MG TABLET    Take 5 mg by mouth 2 (two) times daily. 5 mg at 8a and 4pm   BACLOFEN (LIORESAL) 20 MG TABLET    Take 20 mg by mouth at bedtime.   CHOLECALCIFEROL (VITAMIN D) 1000 UNITS TABLET    Take 1,000 Units by mouth daily.   CLONAZEPAM (KLONOPIN) 0.5 MG TABLET    Take 0.5 tablets (0.25 mg total) by mouth 2 (two) times daily as needed for anxiety.   CLOPIDOGREL (PLAVIX) 75 MG TABLET    Take 75 mg by mouth daily.   DULOXETINE (CYMBALTA) 30 MG CAPSULE    Take 60 mg by mouth daily.   FERROUS SULFATE 325 (65 FE) MG TABLET    Take 325 mg by mouth.    GABAPENTIN (NEURONTIN) 300 MG CAPSULE    Take 300 mg by mouth at bedtime.   HYDRALAZINE (APRESOLINE) 25 MG TABLET    Take 1 tablet (25 mg total) by mouth every 8 (eight) hours.   INSULIN DETEMIR (LEVEMIR) 100 UNIT/ML INJECTION    Inject 12 Units into the skin at bedtime.   LORAZEPAM (ATIVAN) 0.5 MG TABLET    Take 1 tablet (0.5 mg total) by mouth at bedtime. For anxiety   LUBIPROSTONE (AMITIZA) 24 MCG CAPSULE    Take 24 mcg by mouth 2 (two) times daily with a meal.   MAGNESIUM HYDROXIDE (MILK OF MAGNESIA) 800 MG/5ML SUSPENSION    Take 30 mLs by mouth daily as needed for constipation. 30cc   MENTHOL, TOPICAL ANALGESIC, (BIOFREEZE) 4 % GEL    To right ankle and bilateral knees TID   MENTHOL-CETYLPYRIDINIUM (CEPACOL) 3 MG LOZENGE    Take 1 lozenge by mouth every 4 (four) hours as needed for sore throat.   METFORMIN (GLUCOPHAGE) 500 MG TABLET    Take 1,000 mg by mouth 2 (two) times daily with a meal.    METHOCARBAMOL (ROBAXIN) 500 MG TABLET    Take 1 tablet (500 mg total) by mouth at bedtime.   MIDODRINE (PROAMATINE) 5 MG TABLET    Take 5 mg by mouth 3  (three) times daily.   POLYETHYLENE GLYCOL (MIRALAX / GLYCOLAX) PACKET    Take 17 g by mouth daily as needed for mild constipation.    PRAVASTATIN (PRAVACHOL) 40 MG TABLET    Take 40 mg by mouth daily.    SENNOSIDES-DOCUSATE SODIUM (SENOKOT-S) 8.6-50 MG TABLET    Take 1 tablet by mouth daily.   SOLIFENACIN (VESICARE) 5 MG TABLET  Take 5 mg by mouth daily.   TRAMADOL (ULTRAM) 50 MG TABLET    Take two tablets by mouth every 8 hours as needed for pain   WHEAT DEXTRIN (BENEFIBER) POWD    Take 1 Applicatorful by mouth daily. Mix with 8oz of fluid  Modified Medications   No medications on file  Discontinued Medications   No medications on file     Physical Exam: Filed Vitals:   09/08/14 1754  BP: 134/75  Pulse: 77  Temp: 97.2 F (36.2 C)  Resp: 20    Physical Exam  Constitutional: She is oriented to person, place, and time. She appears well-developed and well-nourished. No distress.  HENT:  Head: Normocephalic and atraumatic.  Neck: Normal range of motion. Neck supple.  Cardiovascular: Normal rate, regular rhythm and normal heart sounds.   Pulmonary/Chest: Effort normal and breath sounds normal. No respiratory distress.  Abdominal: Soft. Bowel sounds are normal. She exhibits no distension. There is no tenderness.  Rounded abdomen   Genitourinary: Vagina normal. There is no rash, tenderness, lesion or injury on the right labia. There is no rash, tenderness, lesion or injury on the left labia.  Musculoskeletal: She exhibits no edema or tenderness.  Right sided hemiparesis   Lymphadenopathy:    She has no cervical adenopathy.  Neurological: She is alert and oriented to person, place, and time.  Skin: Skin is warm and dry. She is not diaphoretic.  Psychiatric: She has a normal mood and affect.    Labs reviewed: Basic Metabolic Panel:  Recent Labs  02/12/14 1537 02/13/14 0419 02/14/14 0515  NA 139 139 138  K 5.3 4.6 4.2  CL 103 104 102  CO2 '25 22 21  ' GLUCOSE 128* 124*  141*  BUN 36* 29* 22  CREATININE 1.14* 0.93 0.75  CALCIUM 9.7 9.7 10.3   Liver Function Tests:  Recent Labs  02/12/14 0653  AST 33  ALT 31  ALKPHOS 79  BILITOT 0.5  PROT 7.5  ALBUMIN 3.6   No results for input(s): LIPASE, AMYLASE in the last 8760 hours. No results for input(s): AMMONIA in the last 8760 hours. CBC:  Recent Labs  02/12/14 0730 02/13/14 0419 02/14/14 0515  WBC 10.1 7.2 7.5  HGB 12.6 11.8* 13.5  HCT 39.2 36.4 41.7  MCV 94.2 92.6 91.6  PLT 175 173 225   TSH: No results for input(s): TSH in the last 8760 hours. A1C: Lab Results  Component Value Date   HGBA1C 5.3 04/05/2012   Lipid Panel: No results for input(s): CHOL, HDL, LDLCALC, TRIG, CHOLHDL, LDLDIRECT in the last 8760 hours. CBC with Diff    Result: 01/02/2014 3:28 PM   ( Status: F )     C WBC 6.2     4.0-10.5 K/uL SLN   RBC 4.08     3.87-5.11 MIL/uL SLN   Hemoglobin 12.1     12.0-15.0 g/dL SLN   Hematocrit 36.3     36.0-46.0 % SLN   MCV 89.0     78.0-100.0 fL SLN   MCH 29.7     26.0-34.0 pg SLN   MCHC 33.3     30.0-36.0 g/dL SLN   RDW 14.6     11.5-15.5 % SLN   Platelet Count 190     150-400 K/uL SLN   Granulocyte % 66     43-77 % SLN   Absolute Gran 4.1     1.7-7.7 K/uL SLN   Lymph % 24     12-46 % SLN  Absolute Lymph 1.5     0.7-4.0 K/uL SLN   Mono % 8     3-12 % SLN   Absolute Mono 0.5     0.1-1.0 K/uL SLN   Eos % 2     0-5 % SLN   Absolute Eos 0.1     0.0-0.7 K/uL SLN   Baso % 0     0-1 % SLN   Absolute Baso 0.0     0.0-0.1 K/uL SLN   Smear Review Criteria for review not met  SLN   Comprehensive Metabolic Panel    Result: 01/02/2014 4:53 PM   ( Status: F )       Sodium 133   L 135-145 mEq/L SLN   Potassium 4.4     3.5-5.3 mEq/L SLN   Chloride 101     96-112 mEq/L SLN   CO2 24     19-32 mEq/L SLN   Glucose 226   H 70-99 mg/dL SLN   BUN 21     6-23 mg/dL SLN   Creatinine 0.79     0.50-1.10 mg/dL SLN   Bilirubin, Total 0.4     0.2-1.2 mg/dL SLN   Alkaline Phosphatase 68       39-117 U/L SLN   AST/SGOT 16     0-37 U/L SLN   ALT/SGPT 22     0-35 U/L SLN   Total Protein 6.4     6.0-8.3 g/dL SLN   Albumin 3.6     3.5-5.2 g/dL SLN   Calcium 9.4     8.4-10.5 mg/dL SLN   Hemoglobin A1C    Result: 01/02/2014 10:49 PM   ( Status: F )       Hemoglobin A1C 6.6   H <5.7 % SLN C Estimated Average Glucose 143   H <117 mg/dL SLN   Iron and IBC    Result: 03/20/2014 11:31 AM   ( Status: F )     C Iron 32   L 42-145 ug/dL SLN   UIBC 263     125-400 ug/dL SLN   TIBC 295     250-470 ug/dL SLN   %SAT 11   L 20-55 % SLN   CBC with Diff    Result: 03/20/2014 10:36 AM   ( Status: F )       WBC 6.5     4.0-10.5 K/uL SLN   RBC 3.06   L 3.87-5.11 MIL/uL SLN   Hemoglobin 9.2   L 12.0-15.0 g/dL SLN   Hematocrit 27.4   L 36.0-46.0 % SLN   MCV 89.5     78.0-100.0 fL SLN   MCH 30.1     26.0-34.0 pg SLN   MCHC 33.6     30.0-36.0 g/dL SLN   RDW 14.6     11.5-15.5 % SLN   Platelet Count 302     150-400 K/uL SLN   MPV 9.5     8.6-12.4 fL SLN C Granulocyte % 69     43-77 % SLN   Absolute Gran 4.5     1.7-7.7 K/uL SLN   Lymph % 20     12-46 % SLN   Absolute Lymph 1.3     0.7-4.0 K/uL SLN   Mono % 9     3-12 % SLN   Absolute Mono 0.6     0.1-1.0 K/uL SLN   Eos % 2     0-5 % SLN   Absolute Eos 0.1  0.0-0.7 K/uL SLN   Baso % 0     0-1 % SLN   Absolute Baso 0.0     0.0-0.1 K/uL SLN   Smear Review Criteria for review not met  SLN   Basic Metabolic Panel    Result: 03/20/2014 11:31 AM   ( Status: F )       Sodium 135     135-145 mEq/L SLN   Potassium 4.3     3.5-5.3 mEq/L SLN   Chloride 102     96-112 mEq/L SLN   CO2 24     19-32 mEq/L SLN   Glucose 106   H 70-99 mg/dL SLN   BUN 21     6-23 mg/dL SLN   Creatinine 0.96     0.50-1.10 mg/dL SLN   Calcium 9.6     8.4-10.5 mg/dL SLN   TSH    Result: 03/20/2014 12:34 PM   ( Status: F )       TSH 2.076     0.350-4.500 uIU/mL SLN   Ferritin    Result: 03/20/2014 12:34 PM   ( Status: F )       Ferritin 89      10-291 ng/mL SLN   Hemoglobin A1c with eAG    Result: 04/26/2014 9:46 PM   ( Status: F )     C Hemoglobin A1C 5.8   H <5.7 % SLN C Estimated Average Glucose 120   H <117 mg/dL SLN CBC with Diff    Result: 07/03/2014 3:58 PM   ( Status: F )     C WBC 7.6     4.0-10.5 K/uL SLN   RBC 3.34   L 3.87-5.11 MIL/uL SLN   Hemoglobin 9.4   L 12.0-15.0 g/dL SLN   Hematocrit 28.7   L 36.0-46.0 % SLN   MCV 85.9     78.0-100.0 fL SLN   MCH 28.1     26.0-34.0 pg SLN   MCHC 32.8     30.0-36.0 g/dL SLN   RDW 14.3     11.5-15.5 % SLN   Platelet Count 348     150-400 K/uL SLN   MPV 9.9     8.6-12.4 fL SLN   Granulocyte % 68     43-77 % SLN   Absolute Gran 5.2     1.7-7.7 K/uL SLN   Lymph % 22     12-46 % SLN   Absolute Lymph 1.7     0.7-4.0 K/uL SLN   Mono % 8     3-12 % SLN   Absolute Mono 0.6     0.1-1.0 K/uL SLN   Eos % 2     0-5 % SLN   Absolute Eos 0.2     0.0-0.7 K/uL SLN   Baso % 0     0-1 % SLN   Absolute Baso 0.0     0.0-0.1 K/uL SLN   CMP with Estimated GFR    Result: 08/09/2014 2:06 AM   ( Status: F )       Sodium 136     135-145 mEq/L SLN   Potassium 4.8     3.5-5.3 mEq/L SLN   Chloride 104     96-112 mEq/L SLN   CO2 20     19-32 mEq/L SLN   Glucose 143   H 70-99 mg/dL SLN   BUN 30   H 6-23 mg/dL SLN   Creatinine 1.05     0.50-1.35 mg/dL SLN  Bilirubin, Total 0.3     0.2-1.2 mg/dL SLN   Alkaline Phosphatase 69     39-117 U/L SLN   AST/SGOT 34     0-37 U/L SLN   ALT/SGPT 33     0-53 U/L SLN   Total Protein 6.7     6.0-8.3 g/dL SLN   Albumin 3.9     3.5-5.2 g/dL SLN   Calcium 9.3     8.4-10.5 mg/dL SLN   Est GFR, African American >89      mL/min SLN   Est GFR, NonAfrican American 86  Assessment/Plan  1. Essential hypertension Pt blood pressure has been stable, currently on hydralazine for hypertension and Midodrine 5 mg TID for hypotension. There has been no episodes of hypotension and blood pressure tends to be elevated, will dc midodrine at this time and have staff to take VS q  shift  -conts on hydralazine 50 mg TID 2. Hypotension, unspecified hypotension type See number 1   3. Constipation, unspecified constipation type Controlled on amitiza twice daily  4. DM (diabetes mellitus), type 2 with neurological complications Blood sugars ranging from 115-150 fasting, A1c at 5.8 in february. Will follow up A1c next month  5. Overactive bladder Stable on vesicare  6. Late effects of CVA (cerebrovascular accident) -without change, no increase in pain or muscle spams, cont current medication regimen   7. Anemia, iron deficiency Stable, to cont iron supplements with vit c for better absorption    Niharika Savino K. [place] [place] [facility] [phone] 8 am - 5 pm) [phone] (after hours)

## 2014-10-03 DIAGNOSIS — I10 Essential (primary) hypertension: Secondary | ICD-10-CM | POA: Diagnosis not present

## 2014-10-09 DIAGNOSIS — H5203 Hypermetropia, bilateral: Secondary | ICD-10-CM | POA: Diagnosis not present

## 2014-10-09 DIAGNOSIS — E11329 Type 2 diabetes mellitus with mild nonproliferative diabetic retinopathy without macular edema: Secondary | ICD-10-CM | POA: Diagnosis not present

## 2014-10-09 DIAGNOSIS — H3531 Nonexudative age-related macular degeneration: Secondary | ICD-10-CM | POA: Diagnosis not present

## 2014-10-09 DIAGNOSIS — H2513 Age-related nuclear cataract, bilateral: Secondary | ICD-10-CM | POA: Diagnosis not present

## 2014-10-20 ENCOUNTER — Encounter: Payer: Self-pay | Admitting: Nurse Practitioner

## 2014-10-20 NOTE — Progress Notes (Deleted)
Patient ID: Chelsey Gallagher, female   DOB: [birth year], 64 y.o.   MRN: [id number]    Nursing Home Location:  [place] of Service: SNF (31)  PCP: Hennie Duos, MD  Allergies  Allergen Reactions  . Codeine Other (See Comments)    Per Mar  . Flexeril [Cyclobenzaprine Hcl] Other (See Comments)    Per Mar  . Lyrica [Pregabalin] Other (See Comments)    "wires my up"    Chief Complaint  Patient presents with  . Acute Visit    Acute Concerns     HPI:  Patient is a 64 y.o. female seen today at [facility] ***  Review of Systems:  Review of Systems  Past Medical History  Diagnosis Date  . Diabetes mellitus   . Hypertension   . PAD (peripheral artery disease)     S/p bypass grafting, unclear exactly where  . Nephrolithiasis   . Hyperlipidemia 05/14/2011  . Hearing loss   . Complication of anesthesia     doesn't wake up good- "usually end up in ICU" also experiences post op nausea and vomiting  . Orthostatic hypotension     after surgery.  Marland Kitchen Heart murmur     PCP- [facility]- No tx needed.  . Stroke 05/2011    Weakness on Right. Wears leg brace.  . Foot drop, left   . H/O hiatal hernia     second one  . Diabetic neuropathy     Diabetic neuropathy- has inproved since she quit smoking.  . Arthritis   . Depression   . Lung abnormality 05/11/2011   Past Surgical History  Procedure Laterality Date  . Total hip arthroplasty  2008    Right  . Abdominal hysterectomy  2003  . Cesarean section  1984  . Cholecystectomy  1984  . Bypass graft  2003    Abdominal aortic  . Kidney stone surgery  1999  . Appendectomy  2011  . Hernia repair  2011  . Lithotripsy    . Abdominal hysterectomy    . Esophagogastroduodenoscopy N/A 05/09/2013    Procedure: ESOPHAGOGASTRODUODENOSCOPY (EGD);  Surgeon: Lafayette Dragon, MD;  Location: [facility] ENDOSCOPY;  Service: Endoscopy;  Laterality: N/A;  . Colonoscopy N/A 05/10/2013    Procedure:  COLONOSCOPY;  Surgeon: Lafayette Dragon, MD;  Location: [facility] ENDOSCOPY;  Service: Endoscopy;  Laterality: N/A;   Social History:   reports that she has quit smoking. Her smoking use included Cigarettes. She smoked 0.00 packs per day for 45 years. She quit smokeless tobacco use about 3 years ago. She reports that she does not drink alcohol or use illicit drugs.  Family History  Problem Relation Age of Onset  . Cancer Maternal Aunt     breast    Medications: Patient's Medications  New Prescriptions   No medications on file  Previous Medications   ACETAMINOPHEN (TYLENOL) 325 MG TABLET    Take 650 mg by mouth every 6 (six) hours as needed for pain.   ALBUTEROL (PROVENTIL) (2.5 MG/3ML) 0.083% NEBULIZER SOLUTION    Take 3 mLs (2.5 mg total) by nebulization every 4 (four) hours as needed for wheezing or shortness of breath.   ASPIRIN 81 MG CHEWABLE TABLET    Chew 81 mg by mouth daily.   BACLOFEN (LIORESAL) 10 MG TABLET    Take 5 mg by mouth 2 (two) times daily. 5 mg at 8a and 4pm   BACLOFEN (LIORESAL) 20 MG TABLET  Take 20 mg by mouth at bedtime.   CHOLECALCIFEROL (VITAMIN D) 1000 UNITS TABLET    Take 1,000 Units by mouth daily.   CLONAZEPAM (KLONOPIN) 0.5 MG TABLET    Take 0.5 tablets (0.25 mg total) by mouth 2 (two) times daily as needed for anxiety.   CLOPIDOGREL (PLAVIX) 75 MG TABLET    Take 75 mg by mouth daily.   DULOXETINE (CYMBALTA) 30 MG CAPSULE    Take 60 mg by mouth daily.   FERROUS SULFATE 325 (65 FE) MG TABLET    Take 325 mg by mouth.    GABAPENTIN (NEURONTIN) 300 MG CAPSULE    Take 300 mg by mouth at bedtime.   HYDRALAZINE (APRESOLINE) 25 MG TABLET    Take 1 tablet (25 mg total) by mouth every 8 (eight) hours.   INSULIN DETEMIR (LEVEMIR) 100 UNIT/ML INJECTION    Inject 12 Units into the skin at bedtime.   LORAZEPAM (ATIVAN) 0.5 MG TABLET    Take 1 tablet (0.5 mg total) by mouth at bedtime. For anxiety   LUBIPROSTONE (AMITIZA) 24 MCG CAPSULE    Take 24 mcg by mouth 2 (two) times  daily with a meal.   MAGNESIUM HYDROXIDE (MILK OF MAGNESIA) 800 MG/5ML SUSPENSION    Take 30 mLs by mouth daily as needed for constipation. 30cc   MENTHOL, TOPICAL ANALGESIC, (BIOFREEZE) 4 % GEL    To right ankle and bilateral knees TID   MENTHOL-CETYLPYRIDINIUM (CEPACOL) 3 MG LOZENGE    Take 1 lozenge by mouth every 4 (four) hours as needed for sore throat.   METFORMIN (GLUCOPHAGE) 500 MG TABLET    Take 1,000 mg by mouth 2 (two) times daily with a meal.    METHOCARBAMOL (ROBAXIN) 500 MG TABLET    Take 1 tablet (500 mg total) by mouth at bedtime.   POLYETHYLENE GLYCOL (MIRALAX / GLYCOLAX) PACKET    Take 17 g by mouth daily as needed for mild constipation.    PRAVASTATIN (PRAVACHOL) 40 MG TABLET    Take 40 mg by mouth daily.    SENNOSIDES-DOCUSATE SODIUM (SENOKOT-S) 8.6-50 MG TABLET    Take 1 tablet by mouth daily.   SOLIFENACIN (VESICARE) 5 MG TABLET    Take 5 mg by mouth daily.   TRAMADOL (ULTRAM) 50 MG TABLET    Take two tablets by mouth every 8 hours as needed for pain   WHEAT DEXTRIN (BENEFIBER) POWD    Take 1 Applicatorful by mouth daily. Mix with 8oz of fluid  Modified Medications   No medications on file  Discontinued Medications   No medications on file     Physical Exam: Filed Vitals:   10/20/14 1445  BP: 161/56  Pulse: 87  Temp: 97.4 F (36.3 C)  TempSrc: Oral  Resp: 20  Weight: 143 lb (64.864 kg)    Physical Exam  Labs reviewed: Basic Metabolic Panel:  Recent Labs  02/12/14 1537 02/13/14 0419 02/14/14 0515  NA 139 139 138  K 5.3 4.6 4.2  CL 103 104 102  CO2 25 22 21   GLUCOSE 128* 124* 141*  BUN 36* 29* 22  CREATININE 1.14* 0.93 0.75  CALCIUM 9.7 9.7 10.3   Liver Function Tests:  Recent Labs  02/12/14 0653  AST 33  ALT 31  ALKPHOS 79  BILITOT 0.5  PROT 7.5  ALBUMIN 3.6   No results for input(s): LIPASE, AMYLASE in the last 8760 hours. No results for input(s): AMMONIA in the last 8760 hours. CBC:  Recent Labs  02/12/14 0730 02/13/14 0419  02/14/14 0515  WBC 10.1 7.2 7.5  HGB 12.6 11.8* 13.5  HCT 39.2 36.4 41.7  MCV 94.2 92.6 91.6  PLT 175 173 225   TSH: No results for input(s): TSH in the last 8760 hours. A1C: Lab Results  Component Value Date   HGBA1C 5.3 04/05/2012   Lipid Panel: No results for input(s): CHOL, HDL, LDLCALC, TRIG, CHOLHDL, LDLDIRECT in the last 8760 hours.  Radiological Exams: ***  Assessment/Plan There are no diagnoses linked to this encounter.    Carlos American. Harle Battiest  [facility] [phone] 8 am - 5 pm) [phone] (after hours)

## 2014-10-23 NOTE — Progress Notes (Signed)
Patient ID: Chelsey Gallagher, female   DOB: 13-Jan-1951, 64 y.o.   MRN: [id number] Opened in error

## 2014-10-25 ENCOUNTER — Non-Acute Institutional Stay (SKILLED_NURSING_FACILITY): Payer: Medicare Other | Admitting: Nurse Practitioner

## 2014-10-25 DIAGNOSIS — F419 Anxiety disorder, unspecified: Principal | ICD-10-CM

## 2014-10-25 DIAGNOSIS — F418 Other specified anxiety disorders: Secondary | ICD-10-CM | POA: Diagnosis not present

## 2014-10-25 DIAGNOSIS — F329 Major depressive disorder, single episode, unspecified: Secondary | ICD-10-CM

## 2014-10-25 NOTE — Progress Notes (Signed)
Patient ID: Chelsey Gallagher, female   DOB: 1950/05/09, 64 y.o.   MRN: [id number]    Nursing Home Location:  [place] of Service: SN[facility] (31)  PCP: Hennie Duos, MD  Allergies  Allergen Reactions  . Codeine Other (See Comments)    Per Mar  . Duloxetine Hcl   . Flexeril [Cyclobenzaprine Hcl] Other (See Comments)    Per Mar  . Lyrica [Pregabalin] Other (See Comments)    "wires my up"    Chief Complaint  Patient presents with  . Acute Visit    HPI:  Patient is a 64 y.o. female seen today at [facility]. She has a  pmh of CVA with spastic hemiplegia, constipation, anxiety, DM, HTN, and hyperlipidemia. Pt approached myself in the hallway about how she did not know how much longer she would be able to stay at this place. States there was an incident that she did not want to go into detail about. Pt then states people are dumb, kitchen staff messed up her breakfast order. Was very upset over this. Reports if she had a bomb she "would use it in facility and there would be a lot of black spots around here." states people think she has dementia but she is right in her head. Pt currently taking cymbalta 60 mg daily for depression and anxiety along with ativan 0.5 mg qhs Review of Systems:  Review of Systems  Constitutional: Negative for activity change and appetite change.  Psychiatric/Behavioral: Positive for behavioral problems, self-injury and agitation. Negative for suicidal ideas.    Past Medical History  Diagnosis Date  . Diabetes mellitus   . Hypertension   . PAD (peripheral artery disease)     S/p bypass grafting, unclear exactly where  . Nephrolithiasis   . Hyperlipidemia 05/14/2011  . Hearing loss   . Complication of anesthesia     doesn't wake up good- "usually end up in ICU" also experiences post op nausea and vomiting  . Orthostatic hypotension     after surgery.  Marland Kitchen Heart murmur     PCP- [facility]- No tx needed.  .  Stroke 05/2011    Weakness on Right. Wears leg brace.  . Foot drop, left   . H/O hiatal hernia     second one  . Diabetic neuropathy     Diabetic neuropathy- has inproved since she quit smoking.  . Arthritis   . Depression   . Lung abnormality 05/11/2011   Past Surgical History  Procedure Laterality Date  . Total hip arthroplasty  2008    Right  . Abdominal hysterectomy  2003  . Cesarean section  1984  . Cholecystectomy  1984  . Bypass graft  2003    Abdominal aortic  . Kidney stone surgery  1999  . Appendectomy  2011  . Hernia repair  2011  . Lithotripsy    . Abdominal hysterectomy    . Esophagogastroduodenoscopy N/A 05/09/2013    Procedure: ESOPHAGOGASTRODUODENOSCOPY (EGD);  Surgeon: Lafayette Dragon, MD;  Location: [facility] ENDOSCOPY;  Service: Endoscopy;  Laterality: N/A;  . Colonoscopy N/A 05/10/2013    Procedure: COLONOSCOPY;  Surgeon: Lafayette Dragon, MD;  Location: [facility] ENDOSCOPY;  Service: Endoscopy;  Laterality: N/A;   Social History:   reports that she has quit smoking. Her smoking use included Cigarettes. She smoked 0.00 packs per day for 45 years. She quit smokeless tobacco use about 3 years ago. She reports that she does  not drink alcohol or use illicit drugs.  Family History  Problem Relation Age of Onset  . Cancer Maternal Aunt     breast    Medications: Patient's Medications  New Prescriptions   No medications on file  Previous Medications   ACETAMINOPHEN (TYLENOL) 325 MG TABLET    Take 650 mg by mouth every 6 (six) hours as needed for pain.   ALBUTEROL (PROVENTIL) (2.5 MG/3ML) 0.083% NEBULIZER SOLUTION    Take 3 mLs (2.5 mg total) by nebulization every 4 (four) hours as needed for wheezing or shortness of breath.   ASPIRIN 81 MG CHEWABLE TABLET    Chew 81 mg by mouth daily.   BACLOFEN (LIORESAL) 10 MG TABLET    Take 5 mg by mouth 2 (two) times daily. 5 mg at 8a and 4pm   BACLOFEN (LIORESAL) 20 MG TABLET    Take 20 mg by mouth at bedtime.   CHOLECALCIFEROL (VITAMIN D)  1000 UNITS TABLET    Take 1,000 Units by mouth daily.   CLONAZEPAM (KLONOPIN) 0.5 MG TABLET    Take 0.5 tablets (0.25 mg total) by mouth 2 (two) times daily as needed for anxiety.   CLOPIDOGREL (PLAVIX) 75 MG TABLET    Take 75 mg by mouth daily.   DULOXETINE (CYMBALTA) 30 MG CAPSULE    Take 60 mg by mouth daily.   FERROUS SULFATE 325 (65 FE) MG TABLET    Take 325 mg by mouth.    GABAPENTIN (NEURONTIN) 300 MG CAPSULE    Take 300 mg by mouth at bedtime.   HYDRALAZINE (APRESOLINE) 25 MG TABLET    Take 1 tablet (25 mg total) by mouth every 8 (eight) hours.   INSULIN DETEMIR (LEVEMIR) 100 UNIT/ML INJECTION    Inject 12 Units into the skin at bedtime.   LORAZEPAM (ATIVAN) 0.5 MG TABLET    Take 1 tablet (0.5 mg total) by mouth at bedtime. For anxiety   LUBIPROSTONE (AMITIZA) 24 MCG CAPSULE    Take 24 mcg by mouth 2 (two) times daily with a meal.   MAGNESIUM HYDROXIDE (MILK OF MAGNESIA) 800 MG/5ML SUSPENSION    Take 30 mLs by mouth daily as needed for constipation. 30cc   MENTHOL, TOPICAL ANALGESIC, (BIOFREEZE) 4 % GEL    To right ankle and bilateral knees TID   MENTHOL-CETYLPYRIDINIUM (CEPACOL) 3 MG LOZENGE    Take 1 lozenge by mouth every 4 (four) hours as needed for sore throat.   METFORMIN (GLUCOPHAGE) 500 MG TABLET    Take 1,000 mg by mouth 2 (two) times daily with a meal.    METHOCARBAMOL (ROBAXIN) 500 MG TABLET    Take 1 tablet (500 mg total) by mouth at bedtime.   POLYETHYLENE GLYCOL (MIRALAX / GLYCOLAX) PACKET    Take 17 g by mouth daily as needed for mild constipation.    PRAVASTATIN (PRAVACHOL) 40 MG TABLET    Take 40 mg by mouth daily.    SENNOSIDES-DOCUSATE SODIUM (SENOKOT-S) 8.6-50 MG TABLET    Take 1 tablet by mouth daily.   SOLIFENACIN (VESICARE) 5 MG TABLET    Take 5 mg by mouth daily.   TRAMADOL (ULTRAM) 50 MG TABLET    Take two tablets by mouth every 8 hours as needed for pain   WHEAT DEXTRIN (BENEFIBER) POWD    Take 1 Applicatorful by mouth daily. Mix with 8oz of fluid  Modified  Medications   No medications on file  Discontinued Medications   No medications on file  Physical Exam: Filed Vitals:   10/25/14 1830  BP: 112/73  Pulse: 83  Temp: 98.9 F (37.2 C)  Resp: 20    Physical Exam  Constitutional: She is oriented to person, place, and time. She appears well-developed and well-nourished.  Musculoskeletal: She exhibits no edema.  Neurological: She is alert and oriented to person, place, and time.  Right sided hemiparesis  Skin: Skin is warm and dry.  Psychiatric: Her speech is normal. Her mood appears anxious. Her affect is angry. She is agitated. She expresses impulsivity.    Labs reviewed: Basic Metabolic Panel:  Recent Labs  02/12/14 1537 02/13/14 0419 02/14/14 0515  NA 139 139 138  K 5.3 4.6 4.2  CL 103 104 102  CO2 _0 GLUCOSE 128* 124* 141*  BUN 36* 29* 22  CREATININE 1.14* 0.93 0.75  CALCIUM 9.7 9.7 10.3   Liver Function Tests:  Recent Labs  02/12/14 0653  AST 33  ALT 31  ALKPHOS 79  BILITOT 0.5  PROT 7.5  ALBUMIN 3.6   No results for input(s): LIPASE, AMYLASE in the last 8760 hours. No results for input(s): AMMONIA in the last 8760 hours. CBC:  Recent Labs  02/12/14 0730 02/13/14 0419 02/14/14 0515  WBC 10.1 7.2 7.5  HGB 12.6 11.8* 13.5  HCT 39.2 36.4 41.7  MCV 94.2 92.6 91.6  PLT 175 173 225   TSH: No results for input(s): TSH in the last 8760 hours. A1C: Lab Results  Component Value Date   HGBA1C 5.3 04/05/2012   Lipid Panel: No results for input(s): CHOL, HDL, LDLCALC, TRIG, CHOLHDL, LDLDIRECT in the last 8760 hours. CBC with Diff    Result: 01/02/2014 3:28 PM   ( Status: F )     C WBC 6.2     4.0-10.5 K/uL SLN   RBC 4.08     3.87-5.11 MIL/uL SLN   Hemoglobin 12.1     12.0-15.0 g/dL SLN   Hematocrit 36.3     36.0-46.0 % SLN   MCV 89.0     78.0-100.0 fL SLN   MCH 29.7     26.0-34.0 pg SLN   MCHC 33.3     30.0-36.0 g/dL SLN   RDW 14.6     11.5-15.5 % SLN   Platelet Count 190      150-400 K/uL SLN   Granulocyte % 66     43-77 % SLN   Absolute Gran 4.1     1.7-7.7 K/uL SLN   Lymph % 24     12-46 % SLN   Absolute Lymph 1.5     0.7-4.0 K/uL SLN   Mono % 8     3-12 % SLN   Absolute Mono 0.5     0.1-1.0 K/uL SLN   Eos % 2     0-5 % SLN   Absolute Eos 0.1     0.0-0.7 K/uL SLN   Baso % 0     0-1 % SLN   Absolute Baso 0.0     0.0-0.1 K/uL SLN   Smear Review Criteria for review not met  SLN   Comprehensive Metabolic Panel    Result: 01/02/2014 4:53 PM   ( Status: F )       Sodium 133   L 135-145 mEq/L SLN   Potassium 4.4     3.5-5.3 mEq/L SLN   Chloride 101     96-112 mEq/L SLN   CO2 24     19-32 mEq/L SLN  Glucose 226   H 70-99 mg/dL SLN   BUN 21     6-23 mg/dL SLN   Creatinine 0.79     0.50-1.10 mg/dL SLN   Bilirubin, Total 0.4     0.2-1.2 mg/dL SLN   Alkaline Phosphatase 68     39-117 U/L SLN   AST/SGOT 16     0-37 U/L SLN   ALT/SGPT 22     0-35 U/L SLN   Total Protein 6.4     6.0-8.3 g/dL SLN   Albumin 3.6     3.5-5.2 g/dL SLN   Calcium 9.4     8.4-10.5 mg/dL SLN   Hemoglobin A1C    Result: 01/02/2014 10:49 PM   ( Status: F )       Hemoglobin A1C 6.6   H <5.7 % SLN C Estimated Average Glucose 143   H <117 mg/dL SLN   Iron and IBC    Result: 03/20/2014 11:31 AM   ( Status: F )     C Iron 32   L 42-145 ug/dL SLN   UIBC 263     125-400 ug/dL SLN   TIBC 295     250-470 ug/dL SLN   %SAT 11   L 20-55 % SLN   CBC with Diff    Result: 03/20/2014 10:36 AM   ( Status: F )       WBC 6.5     4.0-10.5 K/uL SLN   RBC 3.06   L 3.87-5.11 MIL/uL SLN   Hemoglobin 9.2   L 12.0-15.0 g/dL SLN   Hematocrit 27.4   L 36.0-46.0 % SLN   MCV 89.5     78.0-100.0 fL SLN   MCH 30.1     26.0-34.0 pg SLN   MCHC 33.6     30.0-36.0 g/dL SLN   RDW 14.6     11.5-15.5 % SLN   Platelet Count 302     150-400 K/uL SLN   MPV 9.5     8.6-12.4 fL SLN C Granulocyte % 69     43-77 % SLN   Absolute Gran 4.5     1.7-7.7 K/uL SLN   Lymph % 20     12-46 % SLN   Absolute Lymph 1.3      0.7-4.0 K/uL SLN   Mono % 9     3-12 % SLN   Absolute Mono 0.6     0.1-1.0 K/uL SLN   Eos % 2     0-5 % SLN   Absolute Eos 0.1     0.0-0.7 K/uL SLN   Baso % 0     0-1 % SLN   Absolute Baso 0.0     0.0-0.1 K/uL SLN   Smear Review Criteria for review not met  SLN   Basic Metabolic Panel    Result: 03/20/2014 11:31 AM   ( Status: F )       Sodium 135     135-145 mEq/L SLN   Potassium 4.3     3.5-5.3 mEq/L SLN   Chloride 102     96-112 mEq/L SLN   CO2 24     19-32 mEq/L SLN   Glucose 106   H 70-99 mg/dL SLN   BUN 21     6-23 mg/dL SLN   Creatinine 0.96     0.50-1.10 mg/dL SLN   Calcium 9.6     8.4-10.5 mg/dL SLN   TSH    Result: 03/20/2014 12:34 PM   ( Status:  F )       TSH 2.076     0.350-4.500 uIU/mL SLN   Ferritin    Result: 03/20/2014 12:34 PM   ( Status: F )       Ferritin 89     10-291 ng/mL SLN   Hemoglobin A1c with eAG    Result: 04/26/2014 9:46 PM   ( Status: F )     C Hemoglobin A1C 5.8   H <5.7 % SLN C Estimated Average Glucose 120   H <117 mg/dL SLN CBC with Diff    Result: 07/03/2014 3:58 PM   ( Status: F )     C WBC 7.6     4.0-10.5 K/uL SLN   RBC 3.34   L 3.87-5.11 MIL/uL SLN   Hemoglobin 9.4   L 12.0-15.0 g/dL SLN   Hematocrit 28.7   L 36.0-46.0 % SLN   MCV 85.9     78.0-100.0 fL SLN   MCH 28.1     26.0-34.0 pg SLN   MCHC 32.8     30.0-36.0 g/dL SLN   RDW 14.3     11.5-15.5 % SLN   Platelet Count 348     150-400 K/uL SLN   MPV 9.9     8.6-12.4 fL SLN   Granulocyte % 68     43-77 % SLN   Absolute Gran 5.2     1.7-7.7 K/uL SLN   Lymph % 22     12-46 % SLN   Absolute Lymph 1.7     0.7-4.0 K/uL SLN   Mono % 8     3-12 % SLN   Absolute Mono 0.6     0.1-1.0 K/uL SLN   Eos % 2     0-5 % SLN   Absolute Eos 0.2     0.0-0.7 K/uL SLN   Baso % 0     0-1 % SLN   Absolute Baso 0.0     0.0-0.1 K/uL SLN   CMP with Estimated GFR    Result: 08/09/2014 2:06 AM   ( Status: F )       Sodium 136     135-145 mEq/L SLN   Potassium 4.8     3.5-5.3 mEq/L SLN   Chloride 104      96-112 mEq/L SLN   CO2 20     19-32 mEq/L SLN   Glucose 143   H 70-99 mg/dL SLN   BUN 30   H 6-23 mg/dL SLN   Creatinine 1.05     0.50-1.35 mg/dL SLN   Bilirubin, Total 0.3     0.2-1.2 mg/dL SLN   Alkaline Phosphatase 69     39-117 U/L SLN   AST/SGOT 34     0-37 U/L SLN   ALT/SGPT 33     0-53 U/L SLN   Total Protein 6.7     6.0-8.3 g/dL SLN   Albumin 3.9     3.5-5.2 g/dL SLN   Calcium 9.3     8.4-10.5 mg/dL SLN   Est GFR, African American >89      mL/min SLN   Est GFR, NonAfrican American 86  Assessment/Plan  1. Anxiety and depression Reassured her that people do not think she has dementia.  -will increase ativan to 0.25 mg scheduled at 9 am and 2 pm, to take along with 9 pm 0.5 mg dosing -to cont cymbalta 60 mg daily -will have psych see pt to follow up as soon as possible -discussed conversation with  nursing, DON and ADON, apparently pt had disagreement with one of the staff members earlier which upset her as well.  -nursing to closely monitor and notify for worsening symptoms or behaviors     Sayaka Hoeppner K. [place], [facility] [phone] 8 am - 5 pm) [phone] (after hours)

## 2014-10-26 ENCOUNTER — Other Ambulatory Visit: Payer: Self-pay

## 2014-10-26 DIAGNOSIS — Z1231 Encounter for screening mammogram for malignant neoplasm of breast: Secondary | ICD-10-CM

## 2014-10-27 ENCOUNTER — Telehealth: Payer: Self-pay

## 2014-10-27 NOTE — Telephone Encounter (Signed)
I left a message for the patient to return my call.

## 2014-10-30 ENCOUNTER — Telehealth: Payer: Self-pay

## 2014-10-30 DIAGNOSIS — F411 Generalized anxiety disorder: Secondary | ICD-10-CM | POA: Diagnosis not present

## 2014-10-30 DIAGNOSIS — F329 Major depressive disorder, single episode, unspecified: Secondary | ICD-10-CM | POA: Diagnosis not present

## 2014-10-30 NOTE — Telephone Encounter (Signed)
I spoke to the patient in re the [employer] study. I performed the Early Assessment telephonic visit per study requirements. Patient has completed study. I thanked her for time and participation.

## 2014-10-31 ENCOUNTER — Ambulatory Visit
Admission: RE | Admit: 2014-10-31 | Discharge: 2014-10-31 | Disposition: A | Discharge status: Home / Self Care | Payer: Medicare Other | Source: Ambulatory Visit

## 2014-10-31 DIAGNOSIS — Z1231 Encounter for screening mammogram for malignant neoplasm of breast: Secondary | ICD-10-CM

## 2014-11-02 ENCOUNTER — Non-Acute Institutional Stay (SKILLED_NURSING_FACILITY): Payer: Medicare Other | Admitting: Nurse Practitioner

## 2014-11-02 DIAGNOSIS — F329 Major depressive disorder, single episode, unspecified: Secondary | ICD-10-CM

## 2014-11-02 DIAGNOSIS — F418 Other specified anxiety disorders: Secondary | ICD-10-CM | POA: Diagnosis not present

## 2014-11-02 DIAGNOSIS — I6990 Unspecified sequelae of unspecified cerebrovascular disease: Secondary | ICD-10-CM | POA: Diagnosis not present

## 2014-11-02 DIAGNOSIS — D509 Iron deficiency anemia, unspecified: Secondary | ICD-10-CM | POA: Diagnosis not present

## 2014-11-02 DIAGNOSIS — F419 Anxiety disorder, unspecified: Secondary | ICD-10-CM

## 2014-11-02 DIAGNOSIS — E1140 Type 2 diabetes mellitus with diabetic neuropathy, unspecified: Secondary | ICD-10-CM

## 2014-11-02 DIAGNOSIS — N3281 Overactive bladder: Secondary | ICD-10-CM | POA: Diagnosis not present

## 2014-11-02 DIAGNOSIS — E1149 Type 2 diabetes mellitus with other diabetic neurological complication: Secondary | ICD-10-CM

## 2014-11-02 DIAGNOSIS — K5900 Constipation, unspecified: Secondary | ICD-10-CM | POA: Diagnosis not present

## 2014-11-02 NOTE — Progress Notes (Signed)
Patient ID: Chelsey Gallagher, female   DOB: [birth year], 64 y.o.   MRN: [id number]    Nursing Home Location:  [place] of Service: SNF (31)  PCP: Hennie Duos, MD  Allergies  Allergen Reactions  . Codeine Other (See Comments)    Per Mar  . Duloxetine Hcl   . Flexeril [Cyclobenzaprine Hcl] Other (See Comments)    Per Mar  . Lyrica [Pregabalin] Other (See Comments)    "wires my up"    Chief Complaint  Patient presents with  . Medical Management of Chronic Issues    HPI:  Patient is a 64 y.o. female seen today at [facility] for routine follow up. Pt with a hx of CVA with spastic hemiplegia, constipation, anxiety, DM, HTN, and hyperlipidemia. Pt has seen psychiatry due to increased agitation and anxiety. Ativan was decreased due to evening dose making her lethargic, overall anxiety has improved. Pt reports she has a rough night with increased spasms in her leg. Better today and now she is resting. Blood sugars have ranging from 94-160 fasting. Pt reports good appetite. Bowels moving good at times. Still remains with overactive bladder, no worsening of symptoms at this time.   Review of Systems:  Review of Systems  Constitutional: Negative for activity change, appetite change, fatigue and unexpected weight change.  HENT: Negative for congestion, hearing loss and sinus pressure.   Eyes: Negative.   Respiratory: Negative for cough and shortness of breath.   Cardiovascular: Negative for chest pain, palpitations and leg swelling.  Gastrointestinal: Positive for constipation (controlled on medications). Negative for abdominal pain and diarrhea.  Genitourinary: Positive for frequency (vesicare helps). Negative for dysuria, vaginal discharge, difficulty urinating and vaginal pain.  Musculoskeletal: Negative for arthralgias.  Skin: Negative for color change and wound.  Neurological: Negative for dizziness and weakness. Numbness: right side.    Psychiatric/Behavioral: Positive for agitation. Negative for behavioral problems and confusion. The patient is nervous/anxious.        Agitation and anxiety improved since adding morning ativan    Past Medical History  Diagnosis Date  . Diabetes mellitus   . Hypertension   . PAD (peripheral artery disease)     S/p bypass grafting, unclear exactly where  . Nephrolithiasis   . Hyperlipidemia 05/14/2011  . Hearing loss   . Complication of anesthesia     doesn't wake up good- "usually end up in ICU" also experiences post op nausea and vomiting  . Orthostatic hypotension     after surgery.  Marland Kitchen Heart murmur     PCP- [facility]- No tx needed.  . Stroke 05/2011    Weakness on Right. Wears leg brace.  . Foot drop, left   . H/O hiatal hernia     second one  . Diabetic neuropathy     Diabetic neuropathy- has inproved since she quit smoking.  . Arthritis   . Depression   . Lung abnormality 05/11/2011   Past Surgical History  Procedure Laterality Date  . Total hip arthroplasty  2008    Right  . Abdominal hysterectomy  2003  . Cesarean section  1984  . Cholecystectomy  1984  . Bypass graft  2003    Abdominal aortic  . Kidney stone surgery  1999  . Appendectomy  2011  . Hernia repair  2011  . Lithotripsy    . Abdominal hysterectomy    . Esophagogastroduodenoscopy N/A 05/09/2013    Procedure: ESOPHAGOGASTRODUODENOSCOPY (EGD);  Surgeon: Lafayette Dragon, MD;  Location[facility] END[facility]COPY;  Service: Endoscopy;  Laterality: N/A;  . Colonoscopy N/A 05/10/2013    Procedure: COLONOSCOPY;  Surgeon: Lafayette Dragon, MD;  Location: [facility] ENDOSCOPY;  Service: Endoscopy;  Laterality: N/A;   Social History:   reports that she has quit smoking. Her smoking use included Cigarettes. She smoked 0.00 packs per day for 45 years. She quit smokeless tobacco use about 3 years ago. She reports that she does not drink alcohol or use illicit drugs.  Family History  Problem Relation Age of Onset  . Cancer  Maternal Aunt     breast    Medications: Patient's Medications  New Prescriptions   No medications on file  Previous Medications   ACETAMINOPHEN (TYLENOL) 325 MG TABLET    Take 650 mg by mouth every 6 (six) hours as needed for pain.   ALBUTEROL (PROVENTIL) (2.5 MG/3ML) 0.083% NEBULIZER SOLUTION    Take 3 mLs (2.5 mg total) by nebulization every 4 (four) hours as needed for wheezing or shortness of breath.   ASPIRIN 81 MG CHEWABLE TABLET    Chew 81 mg by mouth daily.   BACLOFEN (LIORESAL) 10 MG TABLET    Take 5 mg by mouth 2 (two) times daily. 5 mg at 8a and 4pm   BACLOFEN (LIORESAL) 20 MG TABLET    Take 20 mg by mouth at bedtime.   CHOLECALCIFEROL (VITAMIN D) 1000 UNITS TABLET    Take 1,000 Units by mouth daily.   CLONAZEPAM (KLONOPIN) 0.5 MG TABLET    Take 0.5 tablets (0.25 mg total) by mouth 2 (two) times daily as needed for anxiety.   CLOPIDOGREL (PLAVIX) 75 MG TABLET    Take 75 mg by mouth daily.   DULOXETINE (CYMBALTA) 30 MG CAPSULE    Take 60 mg by mouth daily.   FERROUS SULFATE 325 (65 FE) MG TABLET    Take 325 mg by mouth.    GABAPENTIN (NEURONTIN) 300 MG CAPSULE    Take 300 mg by mouth at bedtime.   HYDRALAZINE (APRESOLINE) 25 MG TABLET    Take 1 tablet (25 mg total) by mouth every 8 (eight) hours.   INSULIN DETEMIR (LEVEMIR) 100 UNIT/ML INJECTION    Inject 12 Units into the skin at bedtime.   LORAZEPAM (ATIVAN) 0.5 MG TABLET    Take 1 tablet (0.5 mg total) by mouth at bedtime. For anxiety   LUBIPROSTONE (AMITIZA) 24 MCG CAPSULE    Take 24 mcg by mouth 2 (two) times daily with a meal.   MAGNESIUM HYDROXIDE (MILK OF MAGNESIA) 800 MG/5ML SUSPENSION    Take 30 mLs by mouth daily as needed for constipation. 30cc   MENTHOL, TOPICAL ANALGESIC, (BIOFREEZE) 4 % GEL    To right ankle and bilateral knees TID   MENTHOL-CETYLPYRIDINIUM (CEPACOL) 3 MG LOZENGE    Take 1 lozenge by mouth every 4 (four) hours as needed for sore throat.   METFORMIN (GLUCOPHAGE) 500 MG TABLET    Take 1,000 mg by  mouth 2 (two) times daily with a meal.    METHOCARBAMOL (ROBAXIN) 500 MG TABLET    Take 1 tablet (500 mg total) by mouth at bedtime.   POLYETHYLENE GLYCOL (MIRALAX / GLYCOLAX) PACKET    Take 17 g by mouth daily as needed for mild constipation.    PRAVASTATIN (PRAVACHOL) 40 MG TABLET    Take 40 mg by mouth daily.    SENNOSIDES-DOCUSATE SODIUM (SENOKOT-S) 8.6-50 MG TABLET    Take 1 tablet by  mouth daily.   SOLIFENACIN (VESICARE) 5 MG TABLET    Take 5 mg by mouth daily.   TRAMADOL (ULTRAM) 50 MG TABLET    Take two tablets by mouth every 8 hours as needed for pain   WHEAT DEXTRIN (BENEFIBER) POWD    Take 1 Applicatorful by mouth daily. Mix with 8oz of fluid  Modified Medications   No medications on file  Discontinued Medications   No medications on file     Physical Exam: Filed Vitals:   11/02/14 1511  BP: 116/62  Pulse: 68  Temp: 97.6 F (36.4 C)  Resp: 18  Weight: 143 lb (64.864 kg)    Physical Exam  Constitutional: She is oriented to person, place, and time. She appears well-developed and well-nourished. No distress.  HENT:  Head: Normocephalic and atraumatic.  Neck: Normal range of motion. Neck supple.  Cardiovascular: Normal rate and regular rhythm.   Murmur heard. Pulmonary/Chest: Effort normal and breath sounds normal. No respiratory distress.  Abdominal: Soft. Bowel sounds are normal. She exhibits no distension. There is no tenderness.  Rounded abdomen   Genitourinary: Vagina normal. There is no rash, tenderness, lesion or injury on the right labia. There is no rash, tenderness, lesion or injury on the left labia.  Musculoskeletal: She exhibits no edema or tenderness.  Right sided hemiparesis   Lymphadenopathy:    She has no cervical adenopathy.  Neurological: She is alert and oriented to person, place, and time.  Skin: Skin is warm and dry. She is not diaphoretic.  Psychiatric: She has a normal mood and affect.    Labs reviewed: Basic Metabolic Panel:  Recent  Labs  02/12/14 1537 02/13/14 0419 02/14/14 0515  NA 139 139 138  K 5.3 4.6 4.2  CL 103 104 102  CO2 _0 GLUCOSE 128* 124* 141*  BUN 36* 29* 22  CREATININE 1.14* 0.93 0.75  CALCIUM 9.7 9.7 10.3   Liver Function Tests:  Recent Labs  02/12/14 0653  AST 33  ALT 31  ALKPHOS 79  BILITOT 0.5  PROT 7.5  ALBUMIN 3.6   No results for input(s): LIPASE, AMYLASE in the last 8760 hours. No results for input(s): AMMONIA in the last 8760 hours. CBC:  Recent Labs  02/12/14 0730 02/13/14 0419 02/14/14 0515  WBC 10.1 7.2 7.5  HGB 12.6 11.8* 13.5  HCT 39.2 36.4 41.7  MCV 94.2 92.6 91.6  PLT 175 173 225   TSH: No results for input(s): TSH in the last 8760 hours. A1C: Lab Results  Component Value Date   HGBA1C 5.3 04/05/2012   Lipid Panel: No results for input(s): CHOL, HDL, LDLCALC, TRIG, CHOLHDL, LDLDIRECT in the last 8760 hours.  CBC with Diff  Result: 01/02/2014 3:28 PM ( Status: F ) C WBC6.2  4.0-10.5K/uLSLN  RBC4.08  3.87-5.11MIL/uLSLN  Hemoglobin12.1  12.0-15.0g/dLSLN  Hematocrit36.3  36.0-46.0%SLN  MCV89.0  78.0-100.74fSLN  MCH29.7  26.0-34.0pgSLN  MCHC33.3  30.0-36.0g/dLSLN  RDW14.6  11.5-15.5%SLN  Platelet Count190  150-400K/uLSLN  Granulocyte %66  43-77%SLN  Absolute Gran4.1  1.7-7.7K/uLSLN  Lymph %24  12-46%SLN   Absolute Lymph1.5  0.7-4.0K/uLSLN  Mono %8  3-12%SLN  Absolute Mono0.5  0.1-1.0K/uLSLN  Eos %2  0-5%SLN  Absolute Eos0.1  0.0-0.7K/uLSLN  Baso %0  0-1%SLN  Absolute Baso0.0  0.0-0.1K/uLSLN  Smear ReviewCriteria for review not metSLN  Comprehensive Metabolic Panel  Result: 194/7/09624:53 PM ( Status: F )   Sodium133 L135-1431m/LSLN  Potassium4.4  3.5-5.70m22mLSLN  Chloride101  96-112m19mSLN  CO224  19-32mE25mLN  Glucose226 H70-38m/dLSLN  BUN21  6-273mdLSLN  Creatinine0.79  0.50-1.10mg/dLSLN  Bilirubin, Total0.4  0.2-1.70m670mLSLN  Alkaline Phosphatase68  39-117U/LSLN  AST/SGOT16  0-37U/LSLN  ALT/SGPT22  0-35U/LSLN  Total Protein6.4  6.0-8.3g/dLSLN  Albumin3.6  3.5-5.2g/dLSLN  Calcium9.4  8.4-10.5mg31mSLN  Hemoglobin A1C  Result: 01/02/2014 10:49 PM ( Status: F  )   Hemoglobin A1C6.6 H<5.7%SLNC Estimated Average Glucose143 H<117mg61mLN  Iron and IBC  Result: 03/20/2014 11:31 AM ( Status: F ) C Iron32 L42-145ug/dLSLN  UIBC2QION629-400ug/dLSLN  TIBC295  250-470ug/dLSLN  %SAT11 L20-55%SLN  CBC with Diff  Result: 03/20/2014 10:36 AM ( Status: F )   WBC6.5  4.0-10.5K/uLSLN  RBC3.06 L3.87-5.11MIL/uLSLN  Hemoglobin9.2 L12.0-15.0g/dLSLN  Hematocrit27.4 L36.0-46.0%SLN  MCV89.5  78.0-100.0fLSL50fMCH30.1  26.0-34.0pgSLN  MCHC33.6  30.0-36.0g/dLSLN  RDW14.6  11.5-15.5%SLN  Platelet Count302  150-400K/uLSLN  MPV9.5  8.6-12.4fLSLN72franulocyte %69  43-77%SLN  Absolute Gran4.5  1.7-7.7K/uLSLN  Lymph %20  12-46%SLN  Absolute Lymph1.3  0.7-4.0K/uLSLN  Mono %9  3-12%SLN  Absolute Mono0.6   0.1-1.0K/uLSLN  Eos %2  0-5%SLN  Absolute Eos0.1  0.0-0.7K/uLSLN  Baso %0  0-1%SLN  Absolute Baso0.0  0.0-0.1K/uLSLN  Smear ReviewCriteria for review not metSLN  Basic Metabolic Panel  Result: 1/18/205/28/4132AM ( Status: F )   Sodium135  135-145mEq/L40m Potassium4.3  3.5-5.3mEq/LSL63mChloride102  96-1170mEq/LSL75mO224  19-370mEq/LSLN67mucose106 H70-99mg/dLSLN 79m21  6-23mg/dLSLN  270mtinine0.96  0.50-1.10mg/dLSLN  Calcium9.6  8.4-10.5mg/dLSLN  TS74mResult: 03/20/2014 12:34 PM ( Status: F )   TSH2.076  0.350-4.500uIU/mLSLN  Ferritin  Result: 03/20/2014 12:34 PM ( Status: F )   Ferritin89  10-291ng/mLSLN  Hemoglobin A1c with eAG  Result: 04/26/2014 9:46 PM ( Status: F ) C Hemoglobin A1C5.8 H<5.7%SLNC Estimated Average Glucose120 H<117mg/dLSLN CBC37mh Diff  Result: 07/03/2014 3:58 PM ( Status: F ) C WBC7.6  4.0-10.5K/uLSLN  RBC3.34  L3.87-5.11MIL/uLSLN  Hemoglobin9.4 L12.0-15.0g/dLSLN  Hematocrit28.7 L36.0-46.0%SLN  MCV85.9  78.0-100.0fLSLN  MCH28.189f6.0-34.0pgSLN  MCHC32.8  30.0-36.0g/dLSLN  RDW14.3  11.5-15.5%SLN  Platelet Count348  150-400K/uLSLN  MPV9.9  8.6-12.4fLSLN  Granuloc28f %68  43-77%SLN  Absolute Gran5.2  1.7-7.7K/uLSLN  Lymph %22  12-46%SLN  Absolute Lymph1.7  0.7-4.0K/uLSLN  Mono %8  3-12%SLN  Absolute Mono0.6  0.1-1.0K/uLSLN  Eos %2  0-5%SLN  Absolute Eos0.2  0.0-0.7K/uLSLN  Baso %0  0-1%SLN  Absolute Baso0.0  0.0-0.1K/uLSLN  CMP with Estimated GFR  Result: 08/09/2014 2:06 AM ( Status: F )   Sodium136  135-145mEq/LSLN  Potas53m4.8  3.5-5.3mEq/LSLN  Chlorid58m4  96-1170mEq/LSLN  CO220  71m70mEq/LSLN   Glucose24mH70-99mg/dLSLN  BUN30 H6-63m/dLSLN  Creatinine67m  0.50-1.35mg/dLSLN  Bilirubin, Total0.3  0.2-1.70mg/dLSLN  Alkaline Pho66matase69  39-117U/LSLN  AST/SGOT34  0-37U/LSLN  ALT/SGPT33  0-53U/LSLN  Total Protein6.7  6.0-8.3g/dLSLN  Albumin3.9  3.5-5.2g/dLSLN  Calcium9.3  8.4-10.5mg/dLSLN  Est GFR, Afri34m American>89  mL/minSLN  Est GFR, NonAfrican American86   Assessment/Plan 1. DM (diabetes mellitus), type 2 with neurological complications Blood sugars well controlled, conts on mefromin twice daily, and levemir 12 units daily   2. Late effects of CVA (cerebrovascular accident) Increased spasms yesterday overall pain and spasms have been stable on baclofen and robaxin.  conts on plavix daily  3. Constipation, unspecified constipation type -controlled on amitiza twice daily  4. Anxiety and depression Improved, conts on ativan twice daily and Cymbalta.   5. Anemia, iron deficiency -conts on iron, will follow up CBC  6. Overactive bladder Maintained on vesicare  7. Hypertension -remains stable, last month midodrine 5 mg TID was dc, conts on hydralazine 50 mg  TID -will cont to monitor blood pressure and hypotension  Evelen Vazguez K. Harle Battiest  [facility] [phone] 8 am - 5 pm) [phone] (after hours)

## 2014-11-03 DIAGNOSIS — E1140 Type 2 diabetes mellitus with diabetic neuropathy, unspecified: Secondary | ICD-10-CM | POA: Diagnosis not present

## 2014-11-03 LAB — LIPID PANEL
Cholesterol: 146 mg/dL (ref 0–200)
HDL: 35 mg/dL (ref 35–70)
LDL CALC: 59 mg/dL
Triglycerides: 259 mg/dL — AB (ref 40–160)

## 2014-11-03 LAB — BASIC METABOLIC PANEL
BUN: 36 mg/dL — AB (ref 4–21)
Creatinine: 1.7 mg/dL — AB (ref 0.5–1.1)
GLUCOSE: 227 mg/dL
Potassium: 4.4 mmol/L (ref 3.4–5.3)
SODIUM: 136 mmol/L — AB (ref 137–147)

## 2014-11-03 LAB — CBC AND DIFFERENTIAL
HEMATOCRIT: 34 % — AB (ref 36–46)
Hemoglobin: 10.7 g/dL — AB (ref 12.0–16.0)
Platelets: 276 10*3/uL (ref 150–399)
WBC: 7.6 10*3/mL

## 2014-11-03 LAB — HEMOGLOBIN A1C: HEMOGLOBIN A1C: 6.2 % — AB (ref 4.0–6.0)

## 2014-11-23 DIAGNOSIS — M6281 Muscle weakness (generalized): Secondary | ICD-10-CM | POA: Diagnosis not present

## 2014-11-23 DIAGNOSIS — G8191 Hemiplegia, unspecified affecting right dominant side: Secondary | ICD-10-CM | POA: Diagnosis not present

## 2014-11-24 DIAGNOSIS — G8191 Hemiplegia, unspecified affecting right dominant side: Secondary | ICD-10-CM | POA: Diagnosis not present

## 2014-11-24 DIAGNOSIS — M6281 Muscle weakness (generalized): Secondary | ICD-10-CM | POA: Diagnosis not present

## 2014-11-25 DIAGNOSIS — G8191 Hemiplegia, unspecified affecting right dominant side: Secondary | ICD-10-CM | POA: Diagnosis not present

## 2014-11-25 DIAGNOSIS — M6281 Muscle weakness (generalized): Secondary | ICD-10-CM | POA: Diagnosis not present

## 2014-11-27 ENCOUNTER — Encounter: Payer: Self-pay | Admitting: Internal Medicine

## 2014-11-27 ENCOUNTER — Non-Acute Institutional Stay (SKILLED_NURSING_FACILITY): Payer: Medicare Other | Admitting: Internal Medicine

## 2014-11-27 DIAGNOSIS — M6281 Muscle weakness (generalized): Secondary | ICD-10-CM | POA: Diagnosis not present

## 2014-11-27 DIAGNOSIS — E785 Hyperlipidemia, unspecified: Secondary | ICD-10-CM | POA: Diagnosis not present

## 2014-11-27 DIAGNOSIS — E1140 Type 2 diabetes mellitus with diabetic neuropathy, unspecified: Secondary | ICD-10-CM | POA: Insufficient documentation

## 2014-11-27 DIAGNOSIS — E1149 Type 2 diabetes mellitus with other diabetic neurological complication: Secondary | ICD-10-CM

## 2014-11-27 DIAGNOSIS — E1142 Type 2 diabetes mellitus with diabetic polyneuropathy: Secondary | ICD-10-CM | POA: Diagnosis not present

## 2014-11-27 DIAGNOSIS — G8191 Hemiplegia, unspecified affecting right dominant side: Secondary | ICD-10-CM | POA: Diagnosis not present

## 2014-11-27 NOTE — Assessment & Plan Note (Signed)
Pt on pravachol 40 mg daily;Plan - order FLP

## 2014-11-27 NOTE — Assessment & Plan Note (Addendum)
A1c 6.2 on Glucophage 1000 mg BID and levemir 12 u Qhs; pt is on statin Plan - cont current regimen; form for diabetic shoes comleted

## 2014-11-27 NOTE — Progress Notes (Signed)
MRN: [id number] Name: Chelsey Gallagher  Sex: female Age: 64 y.o. DOB: April 23, 1950  [place] #: [name] Facility/Room:104 Level Of Care: SNF Lastacia Solum: Inocencio Homes D Emergency Contacts: Extended Emergency Contact Information Primary Emergency Contact: Devoria Albe [place] Phone: [phone] Mobile Phone: [phone] Relation: Daughter  Code Status: DNR  Allergies: Codeine; Duloxetine hcl; Flexeril; and Lyrica  Chief Complaint  Patient presents with  . Medical Management of Chronic Issues    HPI: Patient is 64 y.o. female with hx of CVA with spastic hemiplegia, constipation, anxiety, DM, HTN, and hyperlipidemia who is being seen today for DM2, for diabetic shoes, diabetic neuropathy, and HLD.  Past Medical History  Diagnosis Date  . Diabetes mellitus   . Hypertension   . PAD (peripheral artery disease)     S/p bypass grafting, unclear exactly where  . Nephrolithiasis   . Hyperlipidemia 05/14/2011  . Hearing loss   . Complication of anesthesia     doesn't wake up good- "usually end up in ICU" also experiences post op nausea and vomiting  . Orthostatic hypotension     after surgery.  Marland Kitchen Heart murmur     PCP- [facility]- No tx needed.  . Stroke 05/2011    Weakness on Right. Wears leg brace.  . Foot drop, left   . H/O hiatal hernia     second one  . Diabetic neuropathy     Diabetic neuropathy- has inproved since she quit smoking.  . Arthritis   . Depression   . Lung abnormality 05/11/2011    Past Surgical History  Procedure Laterality Date  . Total hip arthroplasty  2008    Right  . Abdominal hysterectomy  2003  . Cesarean section  1984  . Cholecystectomy  1984  . Bypass graft  2003    Abdominal aortic  . Kidney stone surgery  1999  . Appendectomy  2011  . Hernia repair  2011  . Lithotripsy    . Abdominal hysterectomy    . Esophagogastroduodenoscopy N/A 05/09/2013    Procedure: ESOPHAGOGASTRODUODENOSCOPY (EGD);  Surgeon: Lafayette Dragon, MD;  Location: [facility] ENDOSCOPY;  Service: Endoscopy;  Laterality: N/A;  . Colonoscopy N/A 05/10/2013    Procedure: COLONOSCOPY;  Surgeon: Lafayette Dragon, MD;  Location: [facility] ENDOSCOPY;  Service: Endoscopy;  Laterality: N/A;      Medication List       This list is accurate as of: 11/27/14  1:22 PM.  Always use your most recent med list.               acetaminophen 325 MG tablet  Commonly known as:  TYLENOL  Take 650 mg by mouth every 6 (six) hours as needed for pain.     albuterol (2.5 MG/3ML) 0.083% nebulizer solution  Commonly known as:  PROVENTIL  Take 3 mLs (2.5 mg total) by nebulization every 4 (four) hours as needed for wheezing or shortness of breath.     aspirin 81 MG chewable tablet  Chew 81 mg by mouth daily.     baclofen 20 MG tablet  Commonly known as:  LIORESAL  Take 20 mg by mouth at bedtime.     baclofen 10 MG tablet  Commonly known as:  LIORESAL  Take 5 mg by mouth 2 (two) times daily. 5 mg at 8a and 4pm     BENEFIBER Powd  Take 1 Applicatorful by mouth daily. Mix with 8oz of fluid     cholecalciferol 1000 UNITS tablet  Commonly  known as:  VITAMIN D  Take 1,000 Units by mouth daily.     clopidogrel 75 MG tablet  Commonly known as:  PLAVIX  Take 75 mg by mouth daily.     DULoxetine 30 MG capsule  Commonly known as:  CYMBALTA  Take 60 mg by mouth daily.     ferrous sulfate 325 (65 FE) MG tablet  Take 325 mg by mouth.     gabapentin 300 MG capsule  Commonly known as:  NEURONTIN  Take 300 mg by mouth at bedtime.     hydrALAZINE 25 MG tablet  Commonly known as:  APRESOLINE  Take 1 tablet (25 mg total) by mouth every 8 (eight) hours.     insulin detemir 100 UNIT/ML injection  Commonly known as:  LEVEMIR  Inject 12 Units into the skin at bedtime.     LORazepam 0.5 MG tablet  Commonly known as:  ATIVAN  Take 1 tablet (0.5 mg total) by mouth at bedtime. For anxiety     lubiprostone 24 MCG capsule  Commonly known as:  AMITIZA  Take 24 mcg  by mouth 2 (two) times daily with a meal.     magnesium hydroxide 800 MG/5ML suspension  Commonly known as:  MILK OF MAGNESIA  Take 30 mLs by mouth daily as needed for constipation. 30cc     Menthol (Topical Analgesic) 4 % Gel  Commonly known as:  BIOFREEZE  To right ankle and bilateral knees TID     menthol-cetylpyridinium 3 MG lozenge  Commonly known as:  CEPACOL  Take 1 lozenge by mouth every 4 (four) hours as needed for sore throat.     metFORMIN 500 MG tablet  Commonly known as:  GLUCOPHAGE  Take 1,000 mg by mouth 2 (two) times daily with a meal.     methocarbamol 500 MG tablet  Commonly known as:  ROBAXIN  Take 1 tablet (500 mg total) by mouth at bedtime.     polyethylene glycol packet  Commonly known as:  MIRALAX / GLYCOLAX  Take 17 g by mouth daily as needed for mild constipation.     pravastatin 40 MG tablet  Commonly known as:  PRAVACHOL  Take 40 mg by mouth daily.     sennosides-docusate sodium 8.6-50 MG tablet  Commonly known as:  SENOKOT-S  Take 1 tablet by mouth daily.     solifenacin 5 MG tablet  Commonly known as:  VESICARE  Take 5 mg by mouth daily.     traMADol 50 MG tablet  Commonly known as:  ULTRAM  Take two tablets by mouth every 8 hours as needed for pain        No orders of the defined types were placed in this encounter.    Immunization History  Administered Date(s) Administered  . Influenza-Unspecified 12/04/2011  . Pneumococcal Polysaccharide-23 09/03/2011  . Pneumococcal-Unspecified 12/02/2010    Social History  Substance Use Topics  . Smoking status: Former Smoker -- 0.00 packs/day for 45 years    Types: Cigarettes  . Smokeless tobacco: Former Systems developer    Quit date: 05/08/2011  . Alcohol Use: No    Review of Systems  DATA OBTAINED: from patient GENERAL:  no fevers, fatigue, appetite changes SKIN: No itching, rash HEENT: No complaint RESPIRATORY: No cough, wheezing, SOB CARDIAC: No chest pain, palpitations, lower extremity  edema  GI: No abdominal pain, No N/V/D or constipation, No heartburn or reflux  GU: No dysuria, frequency or urgency, or incontinence  MUSCULOSKELETAL: No unrelieved bone/joint  pain; foot drop on L interfering with restorative NEUROLOGIC: No headache, dizziness, shooting pains at night in legs improved with neurontin but still present  PSYCHIATRIC: anxiety better  Filed Vitals:   11/27/14 1251  BP: 140/79  Pulse: 80  Temp: 97.1 F (36.2 C)  Resp: 18    Physical Exam  GENERAL APPEARANCE: Alert, conversant, No acute distress  SKIN: No diaphoresis rash, or wounds HEENT: Unremarkable RESPIRATORY: Breathing is even, unlabored. Lung sounds are clear   CARDIOVASCULAR: Heart RRR no murmurs, rubs or gallops. No peripheral edema  GASTROINTESTINAL: Abdomen is soft, non-tender, not distended w/ normal bowel sounds.  GENITOURINARY: Bladder non tender, not distended  MUSCULOSKELETAL: contractures, L footdrop; L foot hammertoe 2nd toe; R foot no deformities, no ulcerations or callus B NEUROLOGIC: Cranial nerves 2-12 grossly intact; r side weakness PSYCHIATRIC: Mood and affect appropriate to situation, no behavioral issues  Patient Active Problem List   Diagnosis Date Noted  . Neuropathy, diabetic 11/27/2014  . Overactive bladder 08/01/2014  . OA (osteoarthritis) 05/08/2014  . Contracture of elbow joint 05/08/2014  . Contracture of right wrist joint 05/08/2014  . Contracture of joint of finger of right hand 05/08/2014  . Generalized anxiety disorder 08/26/2013  . Late effects of CVA (cerebrovascular accident) 07/08/2013  . Depression   . Hypotension 05/08/2013  . Fecal occult blood test positive 05/08/2013  . Altered mental status 05/08/2013  . Insomnia 04/26/2013  . UTI (urinary tract infection) 07/02/2012  . Anemia, iron deficiency 07/02/2012  . Constipation 07/02/2012  . Carotid stenosis 04/13/2012  . Cerebral artery occlusion with cerebral infarction 04/13/2012  . Orthostatic  hypotension 04/06/2012  . Abnormal TSH 04/06/2012  . Spastic hemiplegia affecting dominant side 07/25/2011  . Stroke 05/15/2011  . Hyperlipidemia 05/14/2011  . Tobacco abuse 05/14/2011  . CVA (cerebral infarction) 05/08/2011  . Hypertension   . DM (diabetes mellitus), type 2 with neurological complications     CBC    Component Value Date/Time   WBC 7.5 02/14/2014 0515   RBC 4.55 02/14/2014 0515   RBC 2.99* 04/05/2012 0345   HGB 13.5 02/14/2014 0515   HCT 41.7 02/14/2014 0515   PLT 225 02/14/2014 0515   MCV 91.6 02/14/2014 0515   LYMPHSABS 2.0 05/07/2013 2223   MONOABS 0.6 05/07/2013 2223   EOSABS 0.1 05/07/2013 2223   BASOSABS 0.0 05/07/2013 2223    CMP     Component Value Date/Time   NA 138 02/14/2014 0515   K 4.2 02/14/2014 0515   CL 102 02/14/2014 0515   CO2 21 02/14/2014 0515   GLUCOSE 141* 02/14/2014 0515   BUN 22 02/14/2014 0515   CREATININE 0.75 02/14/2014 0515   CALCIUM 10.3 02/14/2014 0515   PROT 7.5 02/12/2014 0653   ALBUMIN 3.6 02/12/2014 0653   AST 33 02/12/2014 0653   ALT 31 02/12/2014 0653   ALKPHOS 79 02/12/2014 0653   BILITOT 0.5 02/12/2014 0653   GFRNONAA 89* 02/14/2014 0515   GFRAA >90 02/14/2014 0515    Assessment and Plan  DM (diabetes mellitus), type 2 with neurological complications G4W 6.2 on Glucophage 1000 mg BID and levemir 12 u Qhs; pt is on statin Plan - cont current regimen; form for diabetic shoes comleted  Neuropathy, diabetic Improved with neurontin but still soe problem ; Plan - increase Neurontin to 600 mg QHS  Hyperlipidemia Pt on pravachol 40 mg daily;Plan - order FLP    Hennie Duos, MD

## 2014-11-27 NOTE — Assessment & Plan Note (Signed)
Improved with neurontin but still soe problem ; Plan - increase Neurontin to 600 mg QHS

## 2014-11-28 DIAGNOSIS — E1140 Type 2 diabetes mellitus with diabetic neuropathy, unspecified: Secondary | ICD-10-CM | POA: Diagnosis not present

## 2014-11-28 DIAGNOSIS — M6281 Muscle weakness (generalized): Secondary | ICD-10-CM | POA: Diagnosis not present

## 2014-11-28 DIAGNOSIS — G8191 Hemiplegia, unspecified affecting right dominant side: Secondary | ICD-10-CM | POA: Diagnosis not present

## 2014-11-29 DIAGNOSIS — F411 Generalized anxiety disorder: Secondary | ICD-10-CM | POA: Diagnosis not present

## 2014-11-29 DIAGNOSIS — G8191 Hemiplegia, unspecified affecting right dominant side: Secondary | ICD-10-CM | POA: Diagnosis not present

## 2014-11-29 DIAGNOSIS — M6281 Muscle weakness (generalized): Secondary | ICD-10-CM | POA: Diagnosis not present

## 2014-11-29 DIAGNOSIS — F329 Major depressive disorder, single episode, unspecified: Secondary | ICD-10-CM | POA: Diagnosis not present

## 2014-11-30 DIAGNOSIS — M6281 Muscle weakness (generalized): Secondary | ICD-10-CM | POA: Diagnosis not present

## 2014-11-30 DIAGNOSIS — G8191 Hemiplegia, unspecified affecting right dominant side: Secondary | ICD-10-CM | POA: Diagnosis not present

## 2014-12-01 DIAGNOSIS — M6281 Muscle weakness (generalized): Secondary | ICD-10-CM | POA: Diagnosis not present

## 2014-12-01 DIAGNOSIS — G8191 Hemiplegia, unspecified affecting right dominant side: Secondary | ICD-10-CM | POA: Diagnosis not present

## 2014-12-02 DIAGNOSIS — M6281 Muscle weakness (generalized): Secondary | ICD-10-CM | POA: Diagnosis not present

## 2014-12-02 DIAGNOSIS — M24531 Contracture, right wrist: Secondary | ICD-10-CM | POA: Diagnosis not present

## 2014-12-02 DIAGNOSIS — M24541 Contracture, right hand: Secondary | ICD-10-CM | POA: Diagnosis not present

## 2014-12-02 DIAGNOSIS — M24521 Contracture, right elbow: Secondary | ICD-10-CM | POA: Diagnosis not present

## 2014-12-02 DIAGNOSIS — G8191 Hemiplegia, unspecified affecting right dominant side: Secondary | ICD-10-CM | POA: Diagnosis not present

## 2014-12-04 DIAGNOSIS — M6281 Muscle weakness (generalized): Secondary | ICD-10-CM | POA: Diagnosis not present

## 2014-12-04 DIAGNOSIS — M24521 Contracture, right elbow: Secondary | ICD-10-CM | POA: Diagnosis not present

## 2014-12-04 DIAGNOSIS — M24531 Contracture, right wrist: Secondary | ICD-10-CM | POA: Diagnosis not present

## 2014-12-04 DIAGNOSIS — M24541 Contracture, right hand: Secondary | ICD-10-CM | POA: Diagnosis not present

## 2014-12-04 DIAGNOSIS — G8191 Hemiplegia, unspecified affecting right dominant side: Secondary | ICD-10-CM | POA: Diagnosis not present

## 2014-12-05 DIAGNOSIS — M6281 Muscle weakness (generalized): Secondary | ICD-10-CM | POA: Diagnosis not present

## 2014-12-05 DIAGNOSIS — M24531 Contracture, right wrist: Secondary | ICD-10-CM | POA: Diagnosis not present

## 2014-12-05 DIAGNOSIS — G8191 Hemiplegia, unspecified affecting right dominant side: Secondary | ICD-10-CM | POA: Diagnosis not present

## 2014-12-05 DIAGNOSIS — M24521 Contracture, right elbow: Secondary | ICD-10-CM | POA: Diagnosis not present

## 2014-12-05 DIAGNOSIS — M24541 Contracture, right hand: Secondary | ICD-10-CM | POA: Diagnosis not present

## 2014-12-06 DIAGNOSIS — M6281 Muscle weakness (generalized): Secondary | ICD-10-CM | POA: Diagnosis not present

## 2014-12-06 DIAGNOSIS — M24541 Contracture, right hand: Secondary | ICD-10-CM | POA: Diagnosis not present

## 2014-12-06 DIAGNOSIS — M24531 Contracture, right wrist: Secondary | ICD-10-CM | POA: Diagnosis not present

## 2014-12-06 DIAGNOSIS — M24521 Contracture, right elbow: Secondary | ICD-10-CM | POA: Diagnosis not present

## 2014-12-06 DIAGNOSIS — G8191 Hemiplegia, unspecified affecting right dominant side: Secondary | ICD-10-CM | POA: Diagnosis not present

## 2014-12-07 DIAGNOSIS — M6281 Muscle weakness (generalized): Secondary | ICD-10-CM | POA: Diagnosis not present

## 2014-12-07 DIAGNOSIS — M24531 Contracture, right wrist: Secondary | ICD-10-CM | POA: Diagnosis not present

## 2014-12-07 DIAGNOSIS — G8191 Hemiplegia, unspecified affecting right dominant side: Secondary | ICD-10-CM | POA: Diagnosis not present

## 2014-12-07 DIAGNOSIS — M24541 Contracture, right hand: Secondary | ICD-10-CM | POA: Diagnosis not present

## 2014-12-07 DIAGNOSIS — M24521 Contracture, right elbow: Secondary | ICD-10-CM | POA: Diagnosis not present

## 2014-12-08 DIAGNOSIS — G8191 Hemiplegia, unspecified affecting right dominant side: Secondary | ICD-10-CM | POA: Diagnosis not present

## 2014-12-08 DIAGNOSIS — M24541 Contracture, right hand: Secondary | ICD-10-CM | POA: Diagnosis not present

## 2014-12-08 DIAGNOSIS — M24521 Contracture, right elbow: Secondary | ICD-10-CM | POA: Diagnosis not present

## 2014-12-08 DIAGNOSIS — M6281 Muscle weakness (generalized): Secondary | ICD-10-CM | POA: Diagnosis not present

## 2014-12-08 DIAGNOSIS — M24531 Contracture, right wrist: Secondary | ICD-10-CM | POA: Diagnosis not present

## 2014-12-11 DIAGNOSIS — M6281 Muscle weakness (generalized): Secondary | ICD-10-CM | POA: Diagnosis not present

## 2014-12-11 DIAGNOSIS — G8191 Hemiplegia, unspecified affecting right dominant side: Secondary | ICD-10-CM | POA: Diagnosis not present

## 2014-12-11 DIAGNOSIS — M24521 Contracture, right elbow: Secondary | ICD-10-CM | POA: Diagnosis not present

## 2014-12-11 DIAGNOSIS — M24541 Contracture, right hand: Secondary | ICD-10-CM | POA: Diagnosis not present

## 2014-12-11 DIAGNOSIS — M24531 Contracture, right wrist: Secondary | ICD-10-CM | POA: Diagnosis not present

## 2014-12-12 DIAGNOSIS — M24541 Contracture, right hand: Secondary | ICD-10-CM | POA: Diagnosis not present

## 2014-12-12 DIAGNOSIS — M24521 Contracture, right elbow: Secondary | ICD-10-CM | POA: Diagnosis not present

## 2014-12-12 DIAGNOSIS — M6281 Muscle weakness (generalized): Secondary | ICD-10-CM | POA: Diagnosis not present

## 2014-12-12 DIAGNOSIS — M24531 Contracture, right wrist: Secondary | ICD-10-CM | POA: Diagnosis not present

## 2014-12-12 DIAGNOSIS — G8191 Hemiplegia, unspecified affecting right dominant side: Secondary | ICD-10-CM | POA: Diagnosis not present

## 2014-12-13 DIAGNOSIS — G8191 Hemiplegia, unspecified affecting right dominant side: Secondary | ICD-10-CM | POA: Diagnosis not present

## 2014-12-13 DIAGNOSIS — M6281 Muscle weakness (generalized): Secondary | ICD-10-CM | POA: Diagnosis not present

## 2014-12-13 DIAGNOSIS — M24531 Contracture, right wrist: Secondary | ICD-10-CM | POA: Diagnosis not present

## 2014-12-13 DIAGNOSIS — M24541 Contracture, right hand: Secondary | ICD-10-CM | POA: Diagnosis not present

## 2014-12-13 DIAGNOSIS — M24521 Contracture, right elbow: Secondary | ICD-10-CM | POA: Diagnosis not present

## 2014-12-14 DIAGNOSIS — M24521 Contracture, right elbow: Secondary | ICD-10-CM | POA: Diagnosis not present

## 2014-12-14 DIAGNOSIS — M24541 Contracture, right hand: Secondary | ICD-10-CM | POA: Diagnosis not present

## 2014-12-14 DIAGNOSIS — M6281 Muscle weakness (generalized): Secondary | ICD-10-CM | POA: Diagnosis not present

## 2014-12-14 DIAGNOSIS — G8191 Hemiplegia, unspecified affecting right dominant side: Secondary | ICD-10-CM | POA: Diagnosis not present

## 2014-12-14 DIAGNOSIS — M24531 Contracture, right wrist: Secondary | ICD-10-CM | POA: Diagnosis not present

## 2014-12-15 DIAGNOSIS — M24521 Contracture, right elbow: Secondary | ICD-10-CM | POA: Diagnosis not present

## 2014-12-15 DIAGNOSIS — M6281 Muscle weakness (generalized): Secondary | ICD-10-CM | POA: Diagnosis not present

## 2014-12-15 DIAGNOSIS — G8191 Hemiplegia, unspecified affecting right dominant side: Secondary | ICD-10-CM | POA: Diagnosis not present

## 2014-12-15 DIAGNOSIS — M24541 Contracture, right hand: Secondary | ICD-10-CM | POA: Diagnosis not present

## 2014-12-15 DIAGNOSIS — M24531 Contracture, right wrist: Secondary | ICD-10-CM | POA: Diagnosis not present

## 2014-12-16 DIAGNOSIS — M24541 Contracture, right hand: Secondary | ICD-10-CM | POA: Diagnosis not present

## 2014-12-16 DIAGNOSIS — M24521 Contracture, right elbow: Secondary | ICD-10-CM | POA: Diagnosis not present

## 2014-12-16 DIAGNOSIS — M24531 Contracture, right wrist: Secondary | ICD-10-CM | POA: Diagnosis not present

## 2014-12-16 DIAGNOSIS — M6281 Muscle weakness (generalized): Secondary | ICD-10-CM | POA: Diagnosis not present

## 2014-12-16 DIAGNOSIS — G8191 Hemiplegia, unspecified affecting right dominant side: Secondary | ICD-10-CM | POA: Diagnosis not present

## 2014-12-17 DIAGNOSIS — M24521 Contracture, right elbow: Secondary | ICD-10-CM | POA: Diagnosis not present

## 2014-12-17 DIAGNOSIS — G8191 Hemiplegia, unspecified affecting right dominant side: Secondary | ICD-10-CM | POA: Diagnosis not present

## 2014-12-17 DIAGNOSIS — M6281 Muscle weakness (generalized): Secondary | ICD-10-CM | POA: Diagnosis not present

## 2014-12-17 DIAGNOSIS — M24531 Contracture, right wrist: Secondary | ICD-10-CM | POA: Diagnosis not present

## 2014-12-17 DIAGNOSIS — M24541 Contracture, right hand: Secondary | ICD-10-CM | POA: Diagnosis not present

## 2014-12-18 DIAGNOSIS — M6281 Muscle weakness (generalized): Secondary | ICD-10-CM | POA: Diagnosis not present

## 2014-12-18 DIAGNOSIS — M24541 Contracture, right hand: Secondary | ICD-10-CM | POA: Diagnosis not present

## 2014-12-18 DIAGNOSIS — G8191 Hemiplegia, unspecified affecting right dominant side: Secondary | ICD-10-CM | POA: Diagnosis not present

## 2014-12-18 DIAGNOSIS — M24531 Contracture, right wrist: Secondary | ICD-10-CM | POA: Diagnosis not present

## 2014-12-18 DIAGNOSIS — M24521 Contracture, right elbow: Secondary | ICD-10-CM | POA: Diagnosis not present

## 2014-12-19 DIAGNOSIS — M6281 Muscle weakness (generalized): Secondary | ICD-10-CM | POA: Diagnosis not present

## 2014-12-19 DIAGNOSIS — M24531 Contracture, right wrist: Secondary | ICD-10-CM | POA: Diagnosis not present

## 2014-12-19 DIAGNOSIS — G8191 Hemiplegia, unspecified affecting right dominant side: Secondary | ICD-10-CM | POA: Diagnosis not present

## 2014-12-19 DIAGNOSIS — M24541 Contracture, right hand: Secondary | ICD-10-CM | POA: Diagnosis not present

## 2014-12-19 DIAGNOSIS — M24521 Contracture, right elbow: Secondary | ICD-10-CM | POA: Diagnosis not present

## 2014-12-20 DIAGNOSIS — G8191 Hemiplegia, unspecified affecting right dominant side: Secondary | ICD-10-CM | POA: Diagnosis not present

## 2014-12-20 DIAGNOSIS — M24521 Contracture, right elbow: Secondary | ICD-10-CM | POA: Diagnosis not present

## 2014-12-20 DIAGNOSIS — M24531 Contracture, right wrist: Secondary | ICD-10-CM | POA: Diagnosis not present

## 2014-12-20 DIAGNOSIS — M6281 Muscle weakness (generalized): Secondary | ICD-10-CM | POA: Diagnosis not present

## 2014-12-20 DIAGNOSIS — M24541 Contracture, right hand: Secondary | ICD-10-CM | POA: Diagnosis not present

## 2014-12-21 DIAGNOSIS — M24541 Contracture, right hand: Secondary | ICD-10-CM | POA: Diagnosis not present

## 2014-12-21 DIAGNOSIS — M24521 Contracture, right elbow: Secondary | ICD-10-CM | POA: Diagnosis not present

## 2014-12-21 DIAGNOSIS — M24531 Contracture, right wrist: Secondary | ICD-10-CM | POA: Diagnosis not present

## 2014-12-21 DIAGNOSIS — M6281 Muscle weakness (generalized): Secondary | ICD-10-CM | POA: Diagnosis not present

## 2014-12-21 DIAGNOSIS — G8191 Hemiplegia, unspecified affecting right dominant side: Secondary | ICD-10-CM | POA: Diagnosis not present

## 2014-12-22 DIAGNOSIS — M6281 Muscle weakness (generalized): Secondary | ICD-10-CM | POA: Diagnosis not present

## 2014-12-22 DIAGNOSIS — M24541 Contracture, right hand: Secondary | ICD-10-CM | POA: Diagnosis not present

## 2014-12-22 DIAGNOSIS — G8191 Hemiplegia, unspecified affecting right dominant side: Secondary | ICD-10-CM | POA: Diagnosis not present

## 2014-12-22 DIAGNOSIS — M24521 Contracture, right elbow: Secondary | ICD-10-CM | POA: Diagnosis not present

## 2014-12-22 DIAGNOSIS — M24531 Contracture, right wrist: Secondary | ICD-10-CM | POA: Diagnosis not present

## 2014-12-23 DIAGNOSIS — G8191 Hemiplegia, unspecified affecting right dominant side: Secondary | ICD-10-CM | POA: Diagnosis not present

## 2014-12-23 DIAGNOSIS — M24521 Contracture, right elbow: Secondary | ICD-10-CM | POA: Diagnosis not present

## 2014-12-23 DIAGNOSIS — M24541 Contracture, right hand: Secondary | ICD-10-CM | POA: Diagnosis not present

## 2014-12-23 DIAGNOSIS — M24531 Contracture, right wrist: Secondary | ICD-10-CM | POA: Diagnosis not present

## 2014-12-23 DIAGNOSIS — M6281 Muscle weakness (generalized): Secondary | ICD-10-CM | POA: Diagnosis not present

## 2014-12-25 DIAGNOSIS — M6281 Muscle weakness (generalized): Secondary | ICD-10-CM | POA: Diagnosis not present

## 2014-12-25 DIAGNOSIS — G8191 Hemiplegia, unspecified affecting right dominant side: Secondary | ICD-10-CM | POA: Diagnosis not present

## 2014-12-25 DIAGNOSIS — M24541 Contracture, right hand: Secondary | ICD-10-CM | POA: Diagnosis not present

## 2014-12-25 DIAGNOSIS — M24521 Contracture, right elbow: Secondary | ICD-10-CM | POA: Diagnosis not present

## 2014-12-25 DIAGNOSIS — M24531 Contracture, right wrist: Secondary | ICD-10-CM | POA: Diagnosis not present

## 2014-12-26 DIAGNOSIS — M24531 Contracture, right wrist: Secondary | ICD-10-CM | POA: Diagnosis not present

## 2014-12-26 DIAGNOSIS — M6281 Muscle weakness (generalized): Secondary | ICD-10-CM | POA: Diagnosis not present

## 2014-12-26 DIAGNOSIS — M24521 Contracture, right elbow: Secondary | ICD-10-CM | POA: Diagnosis not present

## 2014-12-26 DIAGNOSIS — G8191 Hemiplegia, unspecified affecting right dominant side: Secondary | ICD-10-CM | POA: Diagnosis not present

## 2014-12-26 DIAGNOSIS — M24541 Contracture, right hand: Secondary | ICD-10-CM | POA: Diagnosis not present

## 2014-12-27 DIAGNOSIS — M24521 Contracture, right elbow: Secondary | ICD-10-CM | POA: Diagnosis not present

## 2014-12-27 DIAGNOSIS — M24541 Contracture, right hand: Secondary | ICD-10-CM | POA: Diagnosis not present

## 2014-12-27 DIAGNOSIS — M24531 Contracture, right wrist: Secondary | ICD-10-CM | POA: Diagnosis not present

## 2014-12-27 DIAGNOSIS — M6281 Muscle weakness (generalized): Secondary | ICD-10-CM | POA: Diagnosis not present

## 2014-12-27 DIAGNOSIS — G8191 Hemiplegia, unspecified affecting right dominant side: Secondary | ICD-10-CM | POA: Diagnosis not present

## 2014-12-28 DIAGNOSIS — M24521 Contracture, right elbow: Secondary | ICD-10-CM | POA: Diagnosis not present

## 2014-12-28 DIAGNOSIS — M24541 Contracture, right hand: Secondary | ICD-10-CM | POA: Diagnosis not present

## 2014-12-28 DIAGNOSIS — M24531 Contracture, right wrist: Secondary | ICD-10-CM | POA: Diagnosis not present

## 2014-12-28 DIAGNOSIS — G8191 Hemiplegia, unspecified affecting right dominant side: Secondary | ICD-10-CM | POA: Diagnosis not present

## 2014-12-28 DIAGNOSIS — M6281 Muscle weakness (generalized): Secondary | ICD-10-CM | POA: Diagnosis not present

## 2014-12-29 ENCOUNTER — Non-Acute Institutional Stay (SKILLED_NURSING_FACILITY): Payer: Medicare Other | Admitting: Nurse Practitioner

## 2014-12-29 DIAGNOSIS — D509 Iron deficiency anemia, unspecified: Secondary | ICD-10-CM

## 2014-12-29 DIAGNOSIS — E1149 Type 2 diabetes mellitus with other diabetic neurological complication: Secondary | ICD-10-CM | POA: Diagnosis not present

## 2014-12-29 DIAGNOSIS — F329 Major depressive disorder, single episode, unspecified: Secondary | ICD-10-CM

## 2014-12-29 DIAGNOSIS — N3281 Overactive bladder: Secondary | ICD-10-CM | POA: Diagnosis not present

## 2014-12-29 DIAGNOSIS — K5900 Constipation, unspecified: Secondary | ICD-10-CM

## 2014-12-29 DIAGNOSIS — F418 Other specified anxiety disorders: Secondary | ICD-10-CM

## 2014-12-29 DIAGNOSIS — I6990 Unspecified sequelae of unspecified cerebrovascular disease: Secondary | ICD-10-CM | POA: Diagnosis not present

## 2014-12-29 DIAGNOSIS — F419 Anxiety disorder, unspecified: Secondary | ICD-10-CM

## 2014-12-29 DIAGNOSIS — E785 Hyperlipidemia, unspecified: Secondary | ICD-10-CM | POA: Diagnosis not present

## 2014-12-29 NOTE — Progress Notes (Signed)
Patient ID: Chelsey Gallagher, female   DOB: 1951/03/02, 64 y.o.   MRN: [id number]    Nursing Home Location:  [place] [facility] Service: SNF (31)  PCP: Hennie Duos, MD  Allergies  Allergen Reactions  . Codeine Other (See Comments)    Per Mar  . Duloxetine Hcl   . Flexeril [Cyclobenzaprine Hcl] Other (See Comments)    Per Mar  . Lyrica [Pregabalin] Other (See Comments)    "wires my up"    Chief Complaint  Patient presents with  . Medical Management of Chronic Issues    HPI:  Patient is a 64 y.o. female seen today at [facility] for routine follow up. Pt with a hx of CVA with spastic hemiplegia, constipation, anxiety, DM, HTN, and hyperlipidemia. Pt in room laying in bed. Reports she is sleeping late today, more tired than normal but feels well. Does report increase in pain with urination over the last several days. constipation well controled. Eating and drinking well. Reports she is drinking more water but likes her coffee. Mood has been good without increase anxiety or depression. Staff without concerns at this time.  Review of Systems:  Review of Systems  Constitutional: Negative for activity change, appetite change, fatigue and unexpected weight change.  HENT: Negative for congestion, hearing loss and sinus pressure.   Eyes: Negative.   Respiratory: Negative for cough and shortness of breath.   Cardiovascular: Negative for chest pain, palpitations and leg swelling.  Gastrointestinal: Positive for constipation (controlled on medications). Negative for abdominal pain and diarrhea.  Genitourinary: Positive for dysuria and frequency (vesicare helps). Negative for vaginal discharge, difficulty urinating and vaginal pain.  Musculoskeletal: Negative for arthralgias.  Skin: Negative for color change and wound.  Neurological: Negative for dizziness and weakness. Numbness: right side.  Psychiatric/Behavioral: Negative for behavioral problems and  confusion. The patient is nervous/anxious (well controlled on current regimen).     Past Medical History  Diagnosis Date  . Diabetes mellitus   . Hypertension   . PAD (peripheral artery disease)     S/p bypass grafting, unclear exactly where  . Nephrolithiasis   . Hyperlipidemia 05/14/2011  . Hearing loss   . Complication of anesthesia     doesn't wake up good- "usually end up in ICU" also experiences post op nausea and vomiting  . Orthostatic hypotension     after surgery.  Marland Kitchen Heart murmur     PCP- [facility]- No tx needed.  . Stroke 05/2011    Weakness on Right. Wears leg brace.  . Foot drop, left   . H/O hiatal hernia     second one  . Diabetic neuropathy     Diabetic neuropathy- has inproved since she quit smoking.  . Arthritis   . Depression   . Lung abnormality 05/11/2011   Past Surgical History  Procedure Laterality Date  . Total hip arthroplasty  2008    Right  . Abdominal hysterectomy  2003  . Cesarean section  1984  . Cholecystectomy  1984  . Bypass graft  2003    Abdominal aortic  . Kidney stone surgery  1999  . Appendectomy  2011  . Hernia repair  2011  . Lithotripsy    . Abdominal hysterectomy    . Esophagogastroduodenoscopy N/A 05/09/2013    Procedure: ESOPHAGOGASTRODUODENOSCOPY (EGD);  Surgeon: Lafayette Dragon, MD;  Location: [facility] ENDOSCOPY;  Service: Endoscopy;  Laterality: N/A;  . Colonoscopy N/A 05/10/2013  Procedure: COLONOSCOPY;  Surgeon: Lafayette Dragon, MD;  Location: [facility] ENDOSCOPY;  Service: Endoscopy;  Laterality: N/A;   Social History:   reports that she has quit smoking. Her smoking use included Cigarettes. She smoked 0.00 packs per day for 45 years. She quit smokeless tobacco use about 3 years ago. She reports that she does not drink alcohol or use illicit drugs.  Family History  Problem Relation Age of Onset  . Cancer Maternal Aunt     breast    Medications: Patient's Medications  New Prescriptions   No medications on file    Previous Medications   ACETAMINOPHEN (TYLENOL) 325 MG TABLET    Take 650 mg by mouth every 6 (six) hours as needed for pain.   ALBUTEROL (PROVENTIL) (2.5 MG/3ML) 0.083% NEBULIZER SOLUTION    Take 3 mLs (2.5 mg total) by nebulization every 4 (four) hours as needed for wheezing or shortness of breath.   ASPIRIN 81 MG CHEWABLE TABLET    Chew 81 mg by mouth daily.   BACLOFEN (LIORESAL) 10 MG TABLET    Take 5 mg by mouth 2 (two) times daily. 5 mg at 8a and 4pm   BACLOFEN (LIORESAL) 20 MG TABLET    Take 20 mg by mouth at bedtime.   CHOLECALCIFEROL (VITAMIN D) 1000 UNITS TABLET    Take 1,000 Units by mouth daily.   CLOPIDOGREL (PLAVIX) 75 MG TABLET    Take 75 mg by mouth daily.   DULOXETINE (CYMBALTA) 30 MG CAPSULE    Take 60 mg by mouth daily.   FERROUS SULFATE 325 (65 FE) MG TABLET    Take 325 mg by mouth.    GABAPENTIN (NEURONTIN) 300 MG CAPSULE    Take 600 mg by mouth at bedtime.    HYDRALAZINE (APRESOLINE) 25 MG TABLET    Take 1 tablet (25 mg total) by mouth every 8 (eight) hours.   INSULIN DETEMIR (LEVEMIR) 100 UNIT/ML INJECTION    Inject 12 Units into the skin at bedtime.   LORAZEPAM (ATIVAN) 0.5 MG TABLET    Take 1 tablet (0.5 mg total) by mouth at bedtime. For anxiety   LUBIPROSTONE (AMITIZA) 24 MCG CAPSULE    Take 24 mcg by mouth 2 (two) times daily with a meal.   MAGNESIUM HYDROXIDE (MILK OF MAGNESIA) 800 MG/5ML SUSPENSION    Take 30 mLs by mouth daily as needed for constipation. 30cc   MENTHOL, TOPICAL ANALGESIC, (BIOFREEZE) 4 % GEL    To right ankle and bilateral knees TID   MENTHOL-CETYLPYRIDINIUM (CEPACOL) 3 MG LOZENGE    Take 1 lozenge by mouth every 4 (four) hours as needed for sore throat.   METFORMIN (GLUCOPHAGE) 500 MG TABLET    Take 1,000 mg by mouth 2 (two) times daily with a meal.    METHOCARBAMOL (ROBAXIN) 500 MG TABLET    Take 1 tablet (500 mg total) by mouth at bedtime.   POLYETHYLENE GLYCOL (MIRALAX / GLYCOLAX) PACKET    Take 17 g by mouth daily as needed for mild  constipation.    PRAVASTATIN (PRAVACHOL) 40 MG TABLET    Take 40 mg by mouth daily.    SENNOSIDES-DOCUSATE SODIUM (SENOKOT-S) 8.6-50 MG TABLET    Take 1 tablet by mouth daily.   SOLIFENACIN (VESICARE) 5 MG TABLET    Take 5 mg by mouth daily.   TRAMADOL (ULTRAM) 50 MG TABLET    Take two tablets by mouth every 8 hours as needed for pain   WHEAT DEXTRIN (BENEFIBER) POWD  Take 1 Applicatorful by mouth daily. Mix with 8oz of fluid  Modified Medications   No medications on file  Discontinued Medications   No medications on file     Physical Exam: Filed Vitals:   12/29/14 1340  BP: 131/79  Pulse: 88  Temp: 97.9 F (36.6 C)  Resp: 18  Weight: 151 lb (68.493 kg)    Physical Exam  Constitutional: She is oriented to person, place, and time. She appears well-developed and well-nourished. No distress.  HENT:  Head: Normocephalic and atraumatic.  Neck: Normal range of motion. Neck supple.  Cardiovascular: Normal rate and regular rhythm.   Murmur heard. Pulmonary/Chest: Effort normal and breath sounds normal. No respiratory distress.  Abdominal: Soft. Bowel sounds are normal. She exhibits no distension. There is no tenderness.  Rounded abdomen   Genitourinary: Vagina normal. There is no rash, tenderness, lesion or injury on the right labia. There is no rash, tenderness, lesion or injury on the left labia.  Musculoskeletal: She exhibits no edema or tenderness.  Right sided hemiparesis   Lymphadenopathy:    She has no cervical adenopathy.  Neurological: She is alert and oriented to person, place, and time.  Skin: Skin is warm and dry. She is not diaphoretic.  Psychiatric: She has a normal mood and affect.    Labs reviewed: Basic Metabolic Panel:  Recent Labs  02/12/14 1537 02/13/14 0419 02/14/14 0515 11/03/14  NA 139 139 138 136*  K 5.3 4.6 4.2 4.4  CL 103 104 102  --   CO2 _0 --   GLUCOSE 128* 124* 141*  --   BUN 36* 29* 22 36*  CREATININE 1.14* 0.93 0.75 1.7*    CALCIUM 9.7 9.7 10.3  --    Liver Function Tests:  Recent Labs  02/12/14 0653  AST 33  ALT 31  ALKPHOS 79  BILITOT 0.5  PROT 7.5  ALBUMIN 3.6   No results for input(s): LIPASE, AMYLASE in the last 8760 hours. No results for input(s): AMMONIA in the last 8760 hours. CBC:  Recent Labs  02/12/14 0730 02/13/14 0419 02/14/14 0515 11/03/14  WBC 10.1 7.2 7.5 7.6  HGB 12.6 11.8* 13.5 10.7*  HCT 39.2 36.4 41.7 34*  MCV 94.2 92.6 91.6  --   PLT 175 173 225 276   TSH: No results for input(s): TSH in the last 8760 hours. A1C: Lab Results  Component Value Date   HGBA1C 6.2* 11/03/2014   Lipid Panel:  Recent Labs  11/03/14  CHOL 146  HDL 35  LDLCALC 59  TRIG 259*    CBC with Diff  Result: 01/02/2014 3:28 PM ( Status: F ) C WBC6.2  4.0-10.5K/uLSLN  RBC4.08  3.87-5.11MIL/uLSLN  Hemoglobin12.1  12.0-15.0g/dLSLN  Hematocrit36.3  36.0-46.0%SLN  MCV89.0  78.0-100.62fSLN  MCH29.7  26.0-34.0pgSLN  MCHC33.3  30.0-36.0g/dLSLN  RDW14.6  11.5-15.5%SLN  Platelet Count190  150-400K/uLSLN  Granulocyte %66  43-77%SLN  Absolute Gran4.1  1.7-7.7K/uLSLN  Lymph %24  12-46%SLN  Absolute Lymph1.5  0.7-4.0K/uLSLN  Mono %8  3-12%SLN   Absolute Mono0.5  0.1-1.0K/uLSLN  Eos %2  0-5%SLN  Absolute Eos0.1  0.0-0.7K/uLSLN  Baso %0  0-1%SLN  Absolute Baso0.0  0.0-0.1K/uLSLN  Smear ReviewCriteria for review not metSLN  Comprehensive Metabolic Panel  Result: 119/6/22294:53 PM ( Status: F )   Sodium133 L135-1427m/LSLN  Potassium4.4  3.5-5.47m247mLSLN  Chloride101  96-112m44mSLN  CO224  19-32mE107mLN  Glucose226 H70-99mg/147mN  BUN21  6-247mg/d42m  Creatinine0.79  0.50-1.10mg/dLSLN  Bilirubin, Total0.4  0.2-1.2mg/dLS51m Alkaline Phosphatase68  39-117U/LSLN  AST/SGOT16  0-37U/LSLN  ALT/SGPT22  0-35U/LSLN  Total Protein6.4  6.0-8.3g/dLSLN  Albumin3.6  3.5-5.2g/dLSLN  Calcium9.4  8.4-10.40m/dLSLN  Hemoglobin A1C  Result: 01/02/2014 10:49 PM ( Status: F )   Hemoglobin A1C6.6 H<5.7%SLNC Estimated Average Glucose143  H<1175mdLSLN  Iron and IBC  Result: 03/20/2014 11:31 AM ( Status: F ) C Iron32 L42-145ug/dLSLN  UIDGLO756125-400ug/dLSLN  TIBC295  250-470ug/dLSLN  %SAT11 L20-55%SLN  CBC with Diff  Result: 03/20/2014 10:36 AM ( Status: F )   WBC6.5  4.0-10.5K/uLSLN  RBC3.06 L3.87-5.11MIL/uLSLN  Hemoglobin9.2 L12.0-15.0g/dLSLN  Hematocrit27.4 L36.0-46.0%SLN  MCV89.5  78.0-100.28f63fN  MCH30.1  26.0-34.0pgSLN  MCHC33.6  30.0-36.0g/dLSLN  RDW14.6  11.5-15.5%SLN  Platelet Count302  150-400K/uLSLN  MPV9.5  8.6-12.4fL32fC Granulocyte %69  43-77%SLN  Absolute Gran4.5  1.7-7.7K/uLSLN  Lymph %20  12-46%SLN  Absolute Lymph1.3  0.7-4.0K/uLSLN  Mono %9  3-12%SLN  Absolute Mono0.6  0.1-1.0K/uLSLN  Eos %2  0-5%SLN  Absolute  Eos0.1  0.0-0.7K/uLSLN  Baso %0  0-1%SLN  Absolute Baso0.0  0.0-0.1K/uLSLN  Smear ReviewCriteria for review not metSLN  Basic Metabolic Panel  Result: 1/184/33/295131 AM ( Status: F )   Sodium135  135-145mE41mLN  Potassium4.3  3.5-5.3mEq/4mN  Chloride102  96-112mEq/69m  CO224  19-32mEq/L1m Glucose106 H70-99mg/dLS23mBUN21  6-23mg/dLSL69mreatinine0.96  0.50-1.10mg/dLSLN  Calcium9.6  8.4-10.5mg/dLSLN 23mH  Result: 03/20/2014 12:34 PM ( Status: F )   TSH2.076  0.350-4.500uIU/mLSLN  Ferritin  Result: 03/20/2014 12:34 PM ( Status: F )   Ferritin89  10-291ng/mLSLN  Hemoglobin A1c with eAG  Result: 04/26/2014 9:46 PM ( Status: F ) C Hemoglobin A1C5.8 H<5.7%SLNC Estimated Average Glucose120 H<117mg/dLSLN 28mwith Diff  Result: 07/03/2014 3:58 PM ( Status: F ) C WBC7.6  4.0-10.5K/uLSLN  RBC3.34 L3.87-5.11MIL/uLSLN  Hemoglobin9.4  L12.0-15.0g/dLSLN  Hematocrit28.7 L36.0-46.0%SLN  MCV85.9  78.0-100.28fLSLN  MCH236f  26.0-34.0pgSLN  MCHC32.8  30.0-36.0g/dLSLN  RDW14.3  11.5-15.5%SLN  Platelet Count348  150-400K/uLSLN  MPV9.9  8.6-12.4fLSLN  Granu43fyte %68  43-77%SLN  Absolute Gran5.2  1.7-7.7K/uLSLN  Lymph %22  12-46%SLN  Absolute Lymph1.7  0.7-4.0K/uLSLN  Mono %8  3-12%SLN  Absolute Mono0.6  0.1-1.0K/uLSLN  Eos %2  0-5%SLN  Absolute Eos0.2  0.0-0.7K/uLSLN  Baso %0  0-1%SLN  Absolute Baso0.0  0.0-0.1K/uLSLN  CMP with Estimated GFR  Result: 08/09/2014 2:06 AM ( Status: F )   Sodium136  135-145mEq/LSLN  Po108mium4.8  3.5-5.3mEq/LSLN  Chlo68me104  96-112mEq/LSLN  CO2287m9-32mEq/LSLN  Gluco34m3 H70-99mg/dLSLN   BUN3041m23mg/dLSLN  Creatin52m.05  0.50-1.35mg/dLSLN  Bilirubin, Total0.3  0.2-1.2mg/dLSLN  Alkaline 49msphatase69  39-117U/LSLN  AST/SGOT34  0-37U/LSLN  ALT/SGPT33  0-53U/LSLN  Total Protein6.7  6.0-8.3g/dLSLN  Albumin3.9  3.5-5.2g/dLSLN  Calcium9.3  8.4-10.5mg/dLSLN  Est GFR, A64mcan American>89  mL/minSLN  Est GFR, NonAfrican American86   Assessment/Plan 1. DM (diabetes mellitus), type 2 with neurological complications (HCC) Blood sugars [place] and well controlled, conts on levemir with metformin. A1c at goal -conts on gabapentin for neuropathy.   2. Late effects of CVA (cerebrovascular accident) Stable with right sided hemiplegia, without worsening symptoms of muscle spasms or pain.   3. Hyperlipidemia LDL at goal however with increased triglycerides, conts on simvastatin and will add lovaza 2 gm BID for hypertriglyceridemia    4. Constipation, unspecified constipation type -well controlled on amitiza BID and senna  5. Anxiety and depression -mood has been stable at this time, will cont current regimen  6. Anemia, iron deficiency -stable, conts on iron with vit C  7. Overactive bladder Worsening frequency with dysuria at times, will get UA C&S conts  on vesicare  8. Elevated Cr Elevated Cr on most recent lab, pt reports good PO intake, will follow up and may need to adjust medications if remains elevated.  Carlos American. Harle Battiest  [facility] [phone] 8 am - 5 pm) [phone] (after hours)

## 2014-12-30 DIAGNOSIS — E1140 Type 2 diabetes mellitus with diabetic neuropathy, unspecified: Secondary | ICD-10-CM | POA: Diagnosis not present

## 2015-01-02 DIAGNOSIS — N390 Urinary tract infection, site not specified: Secondary | ICD-10-CM | POA: Diagnosis not present

## 2015-01-04 IMAGING — CR DG HIP COMPLETE 2+V*R*
3 series · 3 of 3 positions shown · non-contrast
Comparison: None.

CLINICAL DATA: Fall, no pain

RIGHT HIP - COMPLETE 2+ VIEW

[t pelvis a.p.]
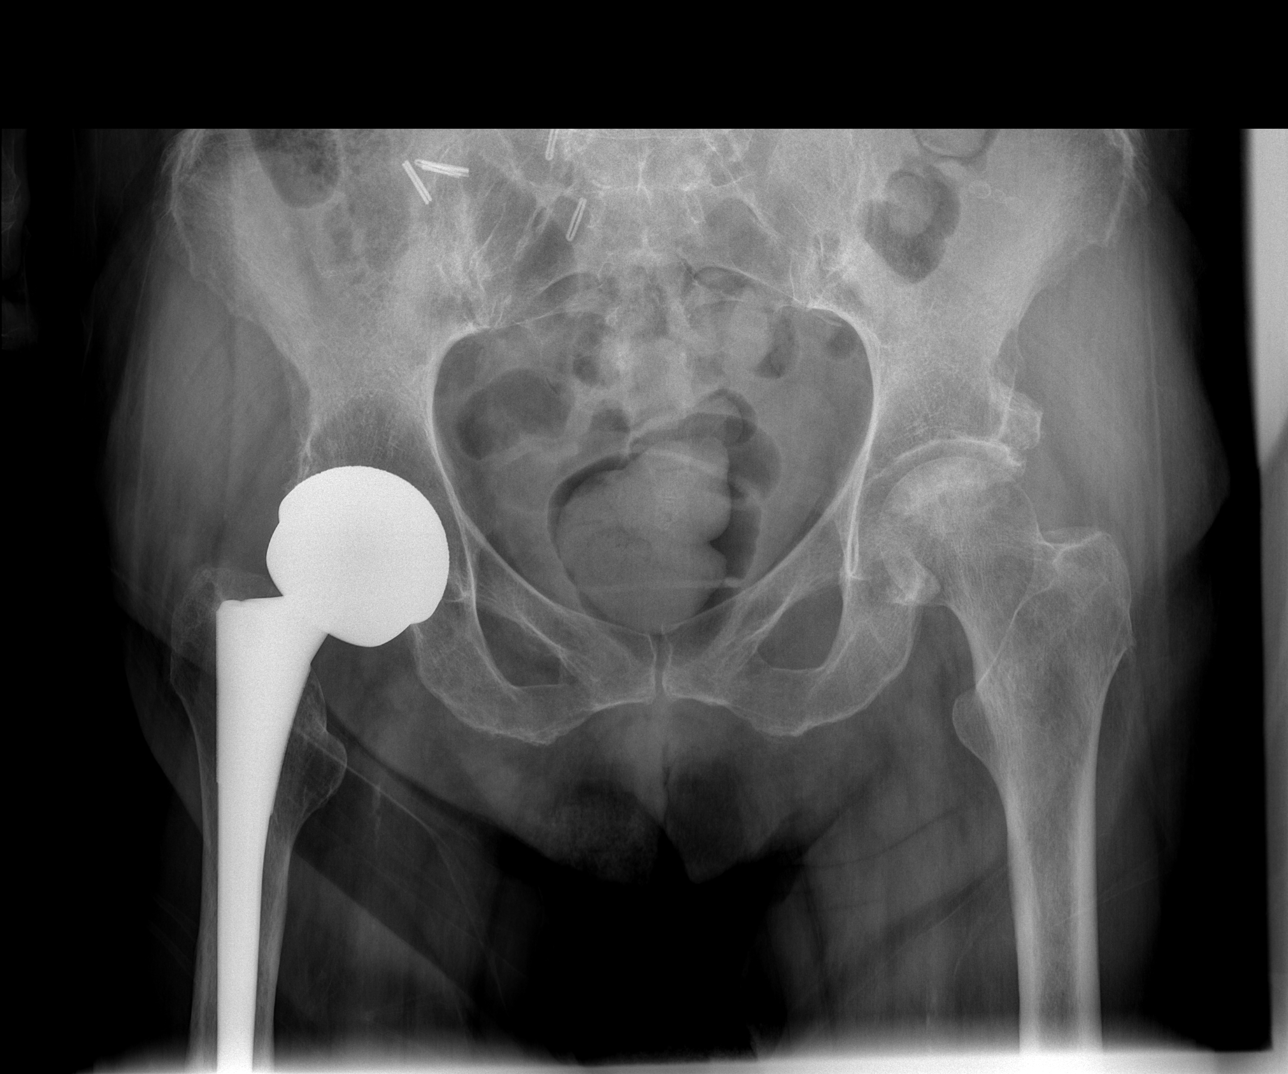

[t hip ap right]
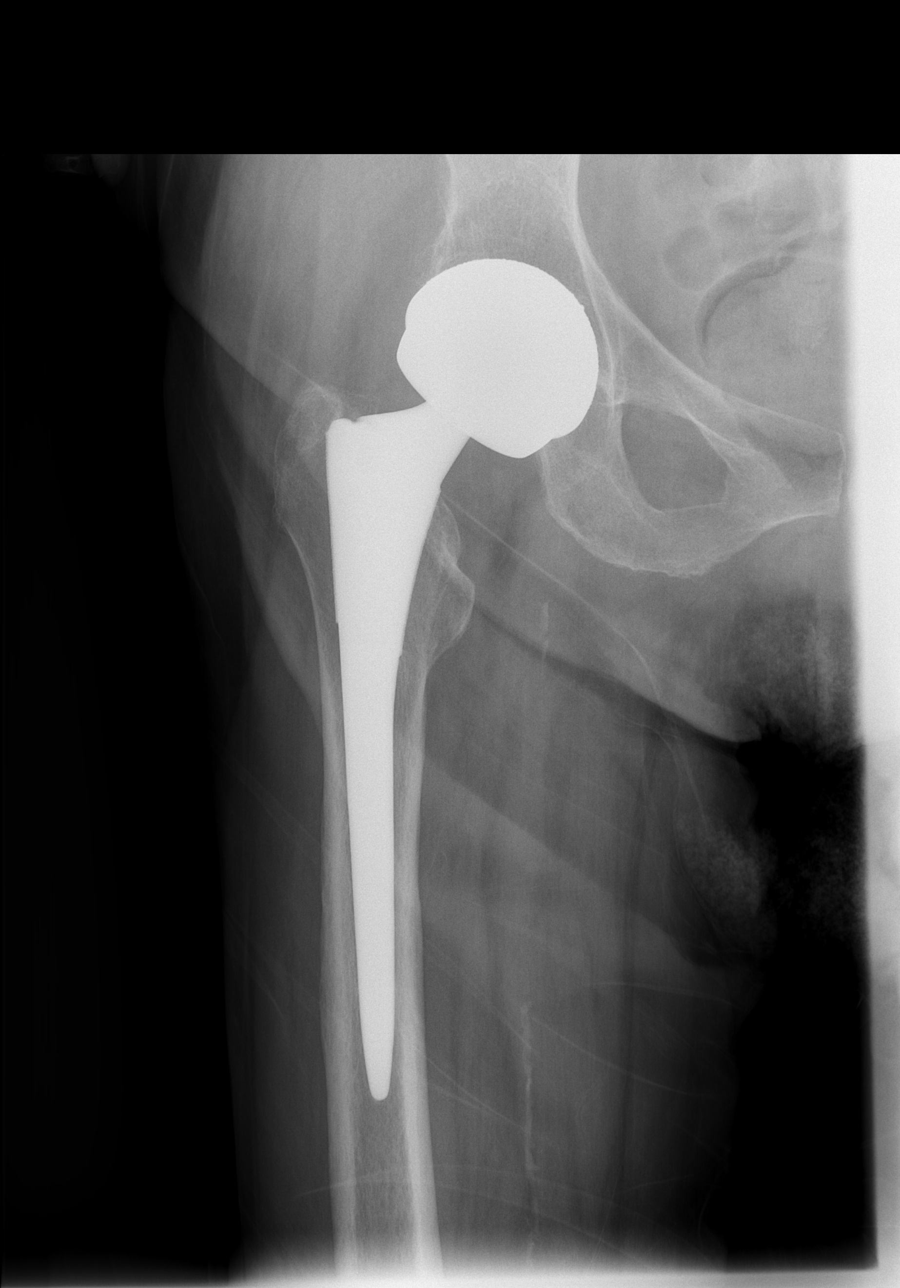

[t hip frog leg right]
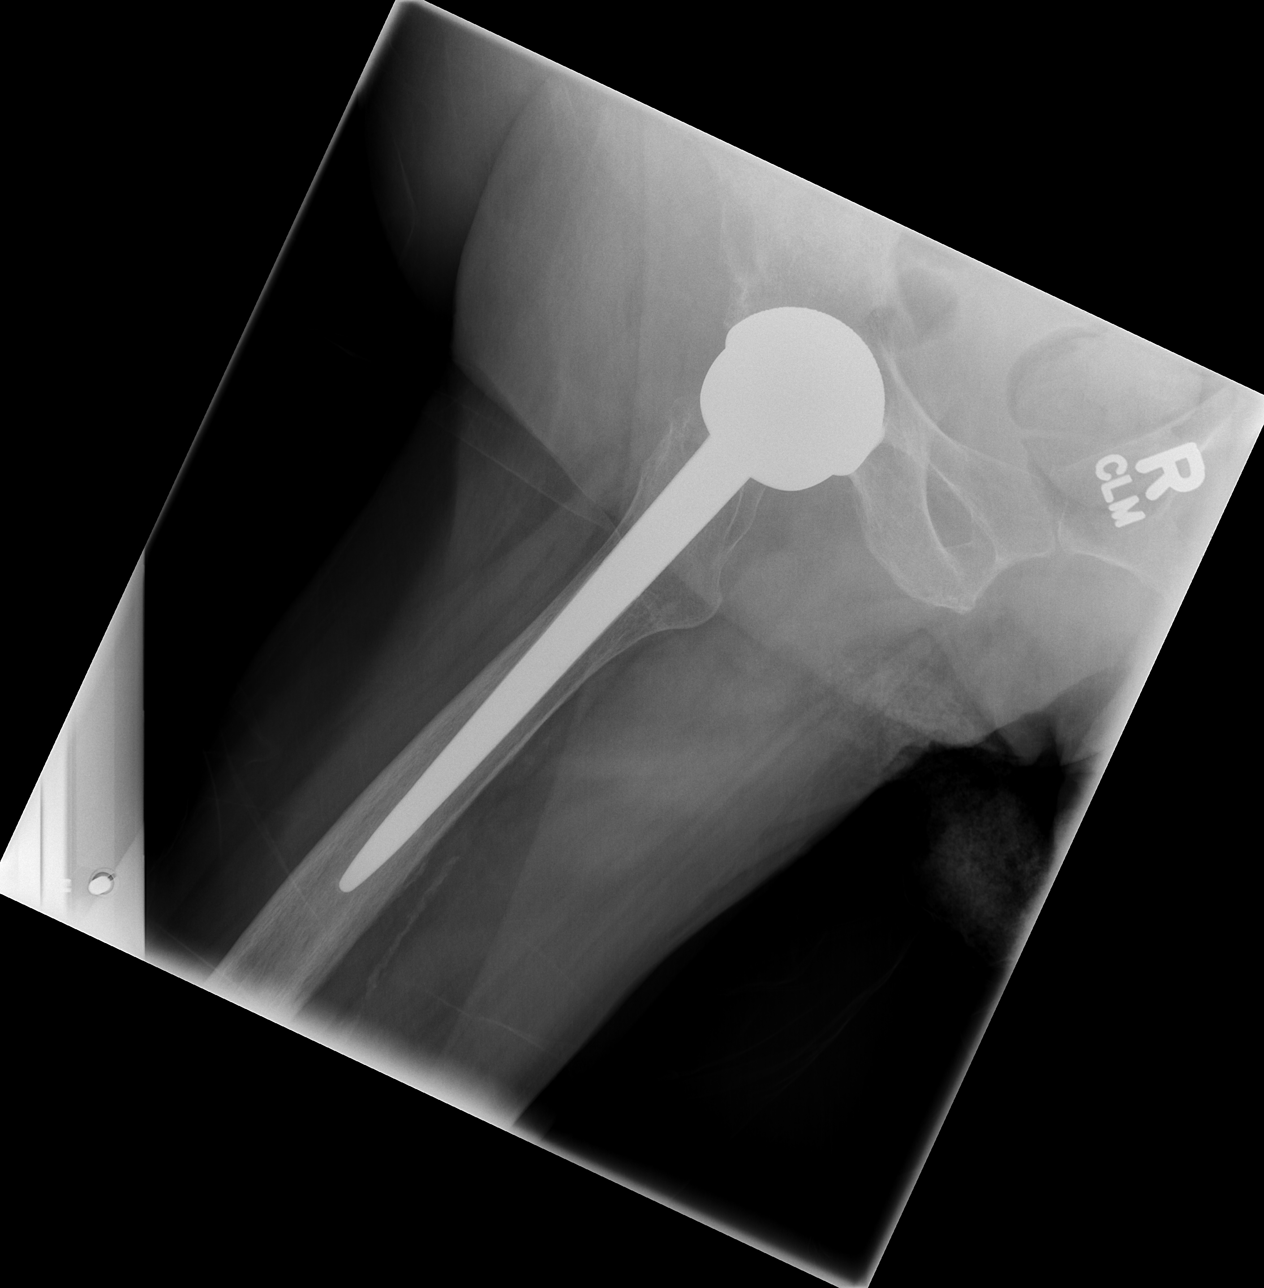

[3 of 3 positions shown; findings below may reference images not displayed]

FINDINGS: Surgical changes of prior prior right hip arthroplasty.
There is some increased rarefaction within the acetabulum
surrounding the acetabular component of the prosthesis which is
most consistent with osteopenia.  No periprosthetic fracture or
hardware complication.  The femoral head component appears seated
within the acetabular component.  Advanced degenerative
osteoarthritis the left hip joint is noted.  The bones diffusely
osteopenic.  Facet arthropathy noted in the lower lumbar spine.
Visualized bowel gas pattern is not obstructed.  Surgical clips
project over the right lower quadrant.
IMPRESSION: Surgical changes of right hip arthroplasty without evidence of
fracture or hardware complication.

Moderately severe left hip osteoarthritis.

## 2015-01-11 DIAGNOSIS — F329 Major depressive disorder, single episode, unspecified: Secondary | ICD-10-CM | POA: Diagnosis not present

## 2015-01-11 DIAGNOSIS — F411 Generalized anxiety disorder: Secondary | ICD-10-CM | POA: Diagnosis not present

## 2015-02-08 DIAGNOSIS — E1165 Type 2 diabetes mellitus with hyperglycemia: Secondary | ICD-10-CM | POA: Diagnosis not present

## 2015-02-08 DIAGNOSIS — E785 Hyperlipidemia, unspecified: Secondary | ICD-10-CM | POA: Diagnosis not present

## 2015-02-08 LAB — LIPID PANEL
Cholesterol: 143 mg/dL (ref 0–200)
HDL: 39 mg/dL (ref 35–70)
LDL Cholesterol: 46 mg/dL
Triglycerides: 291 mg/dL — AB (ref 40–160)

## 2015-02-08 LAB — BASIC METABOLIC PANEL
BUN: 25 mg/dL — AB (ref 4–21)
CREATININE: 0.9 mg/dL (ref 0.5–1.1)
GLUCOSE: 101 mg/dL
Potassium: 4.6 mmol/L (ref 3.4–5.3)
Sodium: 134 mmol/L — AB (ref 137–147)

## 2015-02-08 LAB — HEPATIC FUNCTION PANEL
ALK PHOS: 62 U/L (ref 25–125)
ALT: 13 U/L (ref 7–35)
AST: 17 U/L (ref 13–35)
Bilirubin, Total: 0.3 mg/dL

## 2015-02-08 LAB — HEMOGLOBIN A1C: Hemoglobin A1C: 5.3

## 2015-03-15 ENCOUNTER — Encounter: Payer: Self-pay | Admitting: Internal Medicine

## 2015-03-15 ENCOUNTER — Non-Acute Institutional Stay (SKILLED_NURSING_FACILITY): Payer: Medicare Other | Admitting: Internal Medicine

## 2015-03-15 DIAGNOSIS — G8110 Spastic hemiplegia affecting unspecified side: Secondary | ICD-10-CM

## 2015-03-15 DIAGNOSIS — K5900 Constipation, unspecified: Secondary | ICD-10-CM

## 2015-03-15 DIAGNOSIS — R011 Cardiac murmur, unspecified: Secondary | ICD-10-CM | POA: Diagnosis not present

## 2015-03-15 DIAGNOSIS — I6990 Unspecified sequelae of unspecified cerebrovascular disease: Secondary | ICD-10-CM

## 2015-03-15 DIAGNOSIS — F418 Other specified anxiety disorders: Secondary | ICD-10-CM | POA: Diagnosis not present

## 2015-03-15 DIAGNOSIS — M1990 Unspecified osteoarthritis, unspecified site: Secondary | ICD-10-CM | POA: Diagnosis not present

## 2015-03-15 DIAGNOSIS — N3281 Overactive bladder: Secondary | ICD-10-CM

## 2015-03-15 DIAGNOSIS — F419 Anxiety disorder, unspecified: Secondary | ICD-10-CM

## 2015-03-15 DIAGNOSIS — F329 Major depressive disorder, single episode, unspecified: Secondary | ICD-10-CM

## 2015-03-15 DIAGNOSIS — E1142 Type 2 diabetes mellitus with diabetic polyneuropathy: Secondary | ICD-10-CM

## 2015-03-15 DIAGNOSIS — D509 Iron deficiency anemia, unspecified: Secondary | ICD-10-CM

## 2015-03-15 DIAGNOSIS — I10 Essential (primary) hypertension: Secondary | ICD-10-CM

## 2015-03-15 DIAGNOSIS — E785 Hyperlipidemia, unspecified: Secondary | ICD-10-CM | POA: Diagnosis not present

## 2015-03-15 NOTE — Progress Notes (Signed)
Patient ID: Chelsey Gallagher, female   DOB: [birth year], 65 y.o.   MRN: [id number]    DATE:03/15/15  Location:  [facility]    Place of Service: SNF (31)   Extended Emergency Contact Information Primary Emergency Contact: [place] Phone: [phone] Mobile Phone: [phone] Relation: Daughter  Advanced Directive information  FULL CODE  Chief Complaint  Patient presents with  . Medical Management of Chronic Issues    HPI:  65 yo female seen today for f/u. She c/o constipation on mirlax, amitiza and senna. No change in diet. No nursing issues. No falls. Pain controlled. She sleeps well.   DM - CBG 120-130. No low BS reactions. takes  levemir and  metformin . She has neuropathy and takes neurontin 600 mg nightly   Hx Anemia - stable on will continue iron daily   Hyperlipidemia - stable on pravachol 40 mg daily; lovaza 2 gm twice daily   Urinary incontinence - stable on vesicare 5 mg daily   Hypertension - stable on apresoline 50 mg three times daily   Depression with anxiety - stable on cymbalta 60 mg daily ; ativan will continue 0.25 mg in the AM and 0.5 mg nightly   Hx CVA with right hemiparesis/PAD - stable on asa 81 mg daily; plavix 75 mg daily;  baclofen 5 mg twice daily and 20 mg nightly for spasticity and takes robaxin 500 mg nightly for spasticity  Heart murmur - has carotid study and 2D echo scheduled to evaluate  Past Medical History  Diagnosis Date  . Diabetes mellitus   . Hypertension   . PAD (peripheral artery disease) ([place])     S/p bypass grafting, unclear exactly where  . Nephrolithiasis   . Hyperlipidemia 05/14/2011  . Hearing loss   . Complication of anesthesia     doesn't wake up good- "usually end up in ICU" also experiences post op nausea and vomiting  . Orthostatic hypotension     after surgery.  Marland Kitchen Heart murmur     PCP- [facility]- No tx needed.  . Stroke ([place]) 05/2011    Weakness  on Right. Wears leg brace.  . Foot drop, left   . H/O hiatal hernia     second one  . Diabetic neuropathy (HCC)     Diabetic neuropathy- has inproved since she quit smoking.  . Arthritis   . Depression   . Lung abnormality 05/11/2011    Past Surgical History  Procedure Laterality Date  . Total hip arthroplasty  2008    Right  . Abdominal hysterectomy  2003  . Cesarean section  1984  . Cholecystectomy  1984  . Bypass graft  2003    Abdominal aortic  . Kidney stone surgery  1999  . Appendectomy  2011  . Hernia repair  2011  . Lithotripsy    . Abdominal hysterectomy    . Esophagogastroduodenoscopy N/A 05/09/2013    Procedure: ESOPHAGOGASTRODUODENOSCOPY (EGD);  Surgeon: Lafayette Dragon, MD;  Location: [facility] ENDOSCOPY;  Service: Endoscopy;  Laterality: N/A;  . Colonoscopy N/A 05/10/2013    Procedure: COLONOSCOPY;  Surgeon: Lafayette Dragon, MD;  Location: [facility] ENDOSCOPY;  Service: Endoscopy;  Laterality: N/A;    Patient Care Team: Hennie Duos, MD as PCP - General (Internal Medicine)  Social History   Social History  . Marital Status: Divorced    Spouse Name: N/A  . Number of Children: N/A  . Years of Education: N/A  Occupational History  . Not on file.   Social History Main Topics  . Smoking status: Former Smoker -- 0.00 packs/day for 45 years    Types: Cigarettes  . Smokeless tobacco: Former Systems developer    Quit date: 05/08/2011  . Alcohol Use: No  . Drug Use: No  . Sexual Activity: No   Other Topics Concern  . Not on file   Social History Narrative   Lives by herself but still close to family. Not very active, does housework.      reports that she has quit smoking. Her smoking use included Cigarettes. She smoked 0.00 packs per day for 45 years. She quit smokeless tobacco use about 3 years ago. She reports that she does not drink alcohol or use illicit drugs.  Immunization History  Administered Date(s) Administered  . Influenza-Unspecified 12/04/2011  . Pneumococcal  Polysaccharide-23 09/03/2011  . Pneumococcal-Unspecified 12/02/2010    Allergies  Allergen Reactions  . Codeine Other (See Comments)    Per Mar  . Duloxetine Hcl   . Flexeril [Cyclobenzaprine Hcl] Other (See Comments)    Per Mar  . Lyrica [Pregabalin] Other (See Comments)    "wires my up"    Medications: Patient's Medications  New Prescriptions   No medications on file  Previous Medications   ACETAMINOPHEN (TYLENOL) 325 MG TABLET    Take 650 mg by mouth every 6 (six) hours as needed for pain.   ALBUTEROL (PROVENTIL) (2.5 MG/3ML) 0.083% NEBULIZER SOLUTION    Take 3 mLs (2.5 mg total) by nebulization every 4 (four) hours as needed for wheezing or shortness of breath.   ASPIRIN 81 MG CHEWABLE TABLET    Chew 81 mg by mouth daily.   BACLOFEN (LIORESAL) 10 MG TABLET    Take 5 mg by mouth 2 (two) times daily. 5 mg at 8a and 4pm   BACLOFEN (LIORESAL) 20 MG TABLET    Take 20 mg by mouth at bedtime.   CHOLECALCIFEROL (VITAMIN D) 1000 UNITS TABLET    Take 1,000 Units by mouth daily.   CLOPIDOGREL (PLAVIX) 75 MG TABLET    Take 75 mg by mouth daily.   DULOXETINE (CYMBALTA) 30 MG CAPSULE    Take 60 mg by mouth daily.   FERROUS SULFATE 325 (65 FE) MG TABLET    Take 325 mg by mouth.    GABAPENTIN (NEURONTIN) 300 MG CAPSULE    Take 600 mg by mouth at bedtime.    HYDRALAZINE (APRESOLINE) 25 MG TABLET    Take 1 tablet (25 mg total) by mouth every 8 (eight) hours.   INSULIN DETEMIR (LEVEMIR) 100 UNIT/ML INJECTION    Inject 12 Units into the skin at bedtime.   LORAZEPAM (ATIVAN) 0.5 MG TABLET    Take 1 tablet (0.5 mg total) by mouth at bedtime. For anxiety   LUBIPROSTONE (AMITIZA) 24 MCG CAPSULE    Take 24 mcg by mouth 2 (two) times daily with a meal.   MAGNESIUM HYDROXIDE (MILK OF MAGNESIA) 800 MG/5ML SUSPENSION    Take 30 mLs by mouth daily as needed for constipation. 30cc   MENTHOL, TOPICAL ANALGESIC, (BIOFREEZE) 4 % GEL    To right ankle and bilateral knees TID   MENTHOL-CETYLPYRIDINIUM (CEPACOL)  3 MG LOZENGE    Take 1 lozenge by mouth every 4 (four) hours as needed for sore throat.   METFORMIN (GLUCOPHAGE) 500 MG TABLET    Take 1,000 mg by mouth 2 (two) times daily with a meal.    METHOCARBAMOL (ROBAXIN) 500  MG TABLET    Take 1 tablet (500 mg total) by mouth at bedtime.   POLYETHYLENE GLYCOL (MIRALAX / GLYCOLAX) PACKET    Take 17 g by mouth daily as needed for mild constipation.    PRAVASTATIN (PRAVACHOL) 40 MG TABLET    Take 40 mg by mouth daily.    SENNOSIDES-DOCUSATE SODIUM (SENOKOT-S) 8.6-50 MG TABLET    Take 1 tablet by mouth daily.   SOLIFENACIN (VESICARE) 5 MG TABLET    Take 5 mg by mouth daily.   TRAMADOL (ULTRAM) 50 MG TABLET    Take two tablets by mouth every 8 hours as needed for pain   WHEAT DEXTRIN (BENEFIBER) POWD    Take 1 Applicatorful by mouth daily. Mix with 8oz of fluid  Modified Medications   No medications on file  Discontinued Medications   No medications on file    Review of Systems  Gastrointestinal: Positive for constipation.  Musculoskeletal: Positive for arthralgias and gait problem.  Neurological: Positive for weakness.  Psychiatric/Behavioral: Positive for dysphoric mood. The patient is nervous/anxious.   All other systems reviewed and are negative.   Filed Vitals:   03/15/15 1743  BP: 135/75  Pulse: 89  Temp: 98.3 F (36.8 C)  Weight: 153 lb (69.4 kg)   Body mass index is 27.11 kg/(m^2).  Physical Exam  Constitutional: She is oriented to person, place, and time. She appears well-developed and well-nourished.  Sitting in w/c in NAD  HENT:  Mouth/Throat: Oropharynx is clear and moist. No oropharyngeal exudate.  Eyes: Pupils are equal, round, and reactive to light. No scleral icterus.  Neck: Neck supple. Carotid bruit is present (R>L). No tracheal deviation present. No thyromegaly present.  Cardiovascular: Normal rate, regular rhythm and intact distal pulses.  Exam reveals no gallop and no friction rub.   Murmur heard.  Systolic murmur is  present with a grade of 3/6  No LE edema b/l. no calf TTP.   Pulmonary/Chest: Effort normal and breath sounds normal. No stridor. No respiratory distress. She has no wheezes. She has no rales.  Abdominal: Soft. Bowel sounds are normal. She exhibits no distension and no mass. There is no hepatomegaly. There is no tenderness. There is no rebound and no guarding.  Musculoskeletal: She exhibits edema.  RUE flexion contracture  Lymphadenopathy:    She has no cervical adenopathy.  Neurological: She is alert and oriented to person, place, and time.  Right foot drop  Skin: Skin is warm and dry. No rash noted.  Psychiatric: She has a normal mood and affect. Her behavior is normal. Thought content normal.     Labs reviewed: Nursing Home on 12/29/2014  Component Date Value Ref Range Status  . Hemoglobin 11/03/2014 10.7* 12.0 - 16.0 g/dL Final  . HCT 11/03/2014 34* 36 - 46 % Final  . Platelets 11/03/2014 276  150 - 399 K/L Final  . WBC 11/03/2014 7.6   Final  . Glucose 11/03/2014 227   Final  . BUN 11/03/2014 36* 4 - 21 mg/dL Final  . Creatinine 11/03/2014 1.7* 0.5 - 1.1 mg/dL Final  . Potassium 11/03/2014 4.4  3.4 - 5.3 mmol/L Final  . Sodium 11/03/2014 136* 137 - 147 mmol/L Final  . Triglycerides 11/03/2014 259* 40 - 160 mg/dL Final  . Cholesterol 11/03/2014 146  0 - 200 mg/dL Final  . HDL 11/03/2014 35  35 - 70 mg/dL Final  . LDL Cholesterol 11/03/2014 59   Final  . Hgb A1c MFr Bld 11/03/2014 6.2* 4.0 -  6.0 % Final    No results found.   Assessment/Plan   ICD-9-CM ICD-10-CM   1. Constipation, unspecified constipation type - stable 564.00 K59.00   2. Diabetic polyneuropathy associated with type 2 diabetes mellitus (HCC) 250.60 E11.42    357.2    3. Essential hypertension 401.9 I10   4. Spastic hemiplegia affecting dominant side (HCC) 342.11 G81.10   5. Overactive bladder 596.51 N32.81   6. Osteoarthritis, unspecified osteoarthritis type, unspecified site 715.90 M19.90   7.  Anemia, iron deficiency 280.9 D50.9   8. Anxiety and depression 300.4 F41.8   9. Hyperlipidemia 272.4 E78.5   10. Late effects of CVA (cerebrovascular accident) 438.9 I69.90   11. Heart murmur  785.2 R01.1     F/u with carotid study and 2D echo as scheduled  Cont current meds as ordered  F/u with specialists as scheduled  PT/OT/St as indicated  Will follow  Zorawar Strollo S. Perlie Gold  [facility] and Adult Medicine [address] [place], [place] [address] [phone] Cell (Monday-Friday 8 AM - 5 PM) [phone] After 5 PM and follow prompts

## 2015-04-08 DIAGNOSIS — M25571 Pain in right ankle and joints of right foot: Secondary | ICD-10-CM | POA: Diagnosis not present

## 2015-04-10 DIAGNOSIS — G8191 Hemiplegia, unspecified affecting right dominant side: Secondary | ICD-10-CM | POA: Diagnosis not present

## 2015-04-10 DIAGNOSIS — R262 Difficulty in walking, not elsewhere classified: Secondary | ICD-10-CM | POA: Diagnosis not present

## 2015-04-11 DIAGNOSIS — R262 Difficulty in walking, not elsewhere classified: Secondary | ICD-10-CM | POA: Diagnosis not present

## 2015-04-11 DIAGNOSIS — G8191 Hemiplegia, unspecified affecting right dominant side: Secondary | ICD-10-CM | POA: Diagnosis not present

## 2015-04-12 DIAGNOSIS — R262 Difficulty in walking, not elsewhere classified: Secondary | ICD-10-CM | POA: Diagnosis not present

## 2015-04-12 DIAGNOSIS — G8191 Hemiplegia, unspecified affecting right dominant side: Secondary | ICD-10-CM | POA: Diagnosis not present

## 2015-04-13 DIAGNOSIS — G8191 Hemiplegia, unspecified affecting right dominant side: Secondary | ICD-10-CM | POA: Diagnosis not present

## 2015-04-13 DIAGNOSIS — R262 Difficulty in walking, not elsewhere classified: Secondary | ICD-10-CM | POA: Diagnosis not present

## 2015-04-14 DIAGNOSIS — G8191 Hemiplegia, unspecified affecting right dominant side: Secondary | ICD-10-CM | POA: Diagnosis not present

## 2015-04-14 DIAGNOSIS — R262 Difficulty in walking, not elsewhere classified: Secondary | ICD-10-CM | POA: Diagnosis not present

## 2015-04-16 ENCOUNTER — Non-Acute Institutional Stay (SKILLED_NURSING_FACILITY): Payer: Medicare Other | Admitting: Nurse Practitioner

## 2015-04-16 ENCOUNTER — Encounter: Payer: Self-pay | Admitting: Nurse Practitioner

## 2015-04-16 DIAGNOSIS — N3281 Overactive bladder: Secondary | ICD-10-CM | POA: Diagnosis not present

## 2015-04-16 DIAGNOSIS — G8191 Hemiplegia, unspecified affecting right dominant side: Secondary | ICD-10-CM | POA: Diagnosis not present

## 2015-04-16 DIAGNOSIS — E1149 Type 2 diabetes mellitus with other diabetic neurological complication: Secondary | ICD-10-CM

## 2015-04-16 DIAGNOSIS — G8110 Spastic hemiplegia affecting unspecified side: Secondary | ICD-10-CM | POA: Diagnosis not present

## 2015-04-16 DIAGNOSIS — F411 Generalized anxiety disorder: Secondary | ICD-10-CM

## 2015-04-16 DIAGNOSIS — I10 Essential (primary) hypertension: Secondary | ICD-10-CM

## 2015-04-16 DIAGNOSIS — K5900 Constipation, unspecified: Secondary | ICD-10-CM

## 2015-04-16 DIAGNOSIS — R262 Difficulty in walking, not elsewhere classified: Secondary | ICD-10-CM | POA: Diagnosis not present

## 2015-04-16 NOTE — Progress Notes (Signed)
Patient ID: Chelsey Gallagher, female   DOB: 1950/09/10, 65 y.o.   MRN: [id number]    Nursing Home Location:  [place] of Service: SNF (31)  PCP: Hennie Duos, MD  Allergies  Allergen Reactions  . Codeine Other (See Comments)    Per Mar  . Duloxetine Hcl   . Flexeril [Cyclobenzaprine Hcl] Other (See Comments)    Per Mar  . Lyrica [Pregabalin] Other (See Comments)    "wires my up"    Chief Complaint  Patient presents with  . Medical Management of Chronic Issues    Routine Visit    HPI:  Patient is a 65 y.o. female seen today at [facility] for routine follow up. Pt with a hx of CVA with spastic hemiplegia, constipation, anxiety, DM, HTN, and hyperlipidemia. Pt reports she has been doing well in the last month. Mood has been stable. Reports bowels are very irregular but remain soft. No changes to bladder. No changes in diet, reports she does not eat many vegetables. Pain controlled. Staff without concerns.   Review of Systems:  Review of Systems  Constitutional: Negative for activity change, appetite change, fatigue and unexpected weight change.  HENT: Negative for congestion, hearing loss and sinus pressure.   Eyes: Negative.   Respiratory: Negative for cough and shortness of breath.   Cardiovascular: Negative for chest pain, palpitations and leg swelling.  Gastrointestinal: Positive for constipation (controlled on medications). Negative for abdominal pain and diarrhea.  Genitourinary: Positive for frequency (vesicare helps). Negative for dysuria, vaginal discharge, difficulty urinating and vaginal pain.  Musculoskeletal: Negative for arthralgias.  Skin: Negative for color change and wound.  Neurological: Negative for dizziness and weakness. Numbness: right side.  Psychiatric/Behavioral: Negative for behavioral problems and confusion. The patient is nervous/anxious (well controlled on current regimen).     Past Medical History    Diagnosis Date  . Diabetes mellitus   . Hypertension   . PAD (peripheral artery disease) ([place])     S/p bypass grafting, unclear exactly where  . Nephrolithiasis   . Hyperlipidemia 05/14/2011  . Hearing loss   . Complication of anesthesia     doesn't wake up good- "usually end up in ICU" also experiences post op nausea and vomiting  . Orthostatic hypotension     after surgery.  Marland Kitchen Heart murmur     PCP- [facility]- No tx needed.  . Stroke ([place]) 05/2011    Weakness on Right. Wears leg brace.  . Foot drop, left   . H/O hiatal hernia     second one  . Diabetic neuropathy (HCC)     Diabetic neuropathy- has inproved since she quit smoking.  . Arthritis   . Depression   . Lung abnormality 05/11/2011   Past Surgical History  Procedure Laterality Date  . Total hip arthroplasty  2008    Right  . Abdominal hysterectomy  2003  . Cesarean section  1984  . Cholecystectomy  1984  . Bypass graft  2003    Abdominal aortic  . Kidney stone surgery  1999  . Appendectomy  2011  . Hernia repair  2011  . Lithotripsy    . Abdominal hysterectomy    . Esophagogastroduodenoscopy N/A 05/09/2013    Procedure: ESOPHAGOGASTRODUODENOSCOPY (EGD);  Surgeon: Lafayette Dragon, MD;  Location: [facility] ENDOSCOPY;  Service: Endoscopy;  Laterality: N/A;  . Colonoscopy N/A 05/10/2013    Procedure: COLONOSCOPY;  Surgeon: Lafayette Dragon, MD;  Location:  [facility] ENDOSCOPY;  Service: Endoscopy;  Laterality: N/A;   Social History:   reports that she has quit smoking. Her smoking use included Cigarettes. She smoked 0.00 packs per day for 45 years. She quit smokeless tobacco use about 3 years ago. She reports that she does not drink alcohol or use illicit drugs.  Family History  Problem Relation Age of Onset  . Cancer Maternal Aunt     breast    Medications: Patient's Medications  New Prescriptions   No medications on file  Previous Medications   ACETAMINOPHEN (TYLENOL) 325 MG TABLET    Take 650 mg by mouth every 6  (six) hours as needed for pain.   ALBUTEROL (PROVENTIL) (2.5 MG/3ML) 0.083% NEBULIZER SOLUTION    Take 3 mLs (2.5 mg total) by nebulization every 4 (four) hours as needed for wheezing or shortness of breath.   ASPIRIN 81 MG CHEWABLE TABLET    Chew 81 mg by mouth daily.   BACLOFEN (LIORESAL) 10 MG TABLET    Take 5 mg by mouth 2 (two) times daily. 5 mg at 8a and 4pm   BACLOFEN (LIORESAL) 20 MG TABLET    Take 20 mg by mouth at bedtime.   CHOLECALCIFEROL (VITAMIN D) 1000 UNITS TABLET    Take 1,000 Units by mouth daily.   CLOPIDOGREL (PLAVIX) 75 MG TABLET    Take 75 mg by mouth daily.   DULOXETINE (CYMBALTA) 30 MG CAPSULE    Take 60 mg by mouth daily.   FERROUS SULFATE 325 (65 FE) MG TABLET    Take 325 mg by mouth.    GABAPENTIN (NEURONTIN) 300 MG CAPSULE    Take 600 mg by mouth at bedtime.    HYDRALAZINE (APRESOLINE) 25 MG TABLET    Take 1 tablet (25 mg total) by mouth every 8 (eight) hours.   INSULIN DETEMIR (LEVEMIR) 100 UNIT/ML INJECTION    Inject 12 Units into the skin at bedtime.   LORAZEPAM (ATIVAN) 0.5 MG TABLET    Take 1 tablet (0.5 mg total) by mouth at bedtime. For anxiety   LUBIPROSTONE (AMITIZA) 24 MCG CAPSULE    Take 24 mcg by mouth 2 (two) times daily with a meal.   MAGNESIUM HYDROXIDE (MILK OF MAGNESIA) 800 MG/5ML SUSPENSION    Take 30 mLs by mouth daily as needed for constipation. 30cc   MENTHOL, TOPICAL ANALGESIC, (BIOFREEZE) 4 % GEL    To right ankle and bilateral knees TID   MENTHOL-CETYLPYRIDINIUM (CEPACOL) 3 MG LOZENGE    Take 1 lozenge by mouth every 4 (four) hours as needed for sore throat.   METFORMIN (GLUCOPHAGE) 500 MG TABLET    Take 1,000 mg by mouth 2 (two) times daily with a meal.    METHOCARBAMOL (ROBAXIN) 500 MG TABLET    Take 1 tablet (500 mg total) by mouth at bedtime.   NYSTATIN CREAM (MYCOSTATIN)    Apply to groin TID prn rash, reddness, or itching.   OMEGA-3 ACID ETHYL ESTERS (LOVAZA) 1 G CAPSULE    Take 2 g by mouth 2 (two) times daily.   POLYETHYLENE GLYCOL  (MIRALAX / GLYCOLAX) PACKET    Take 17 g by mouth daily as needed for mild constipation.    PRAVASTATIN (PRAVACHOL) 40 MG TABLET    Take 40 mg by mouth daily.    PROMETHAZINE (PHENERGAN) 25 MG TABLET    Take either by mouth, IM injection, or suppository every 6 hours PRN for nausea or vomiting.   SENNOSIDES-DOCUSATE SODIUM (SENOKOT-S) 8.6-50 MG TABLET  Take 1 tablet by mouth daily.   SOLIFENACIN (VESICARE) 5 MG TABLET    Take 5 mg by mouth daily.   TRAMADOL (ULTRAM) 50 MG TABLET    Take two tablets by mouth every 8 hours as needed for pain   VITAMIN C (ASCORBIC ACID) 500 MG TABLET    Take 500 mg by mouth daily.   WHEAT DEXTRIN (BENEFIBER) POWD    Take 1 Applicatorful by mouth daily. Mix with 8oz of fluid  Modified Medications   No medications on file  Discontinued Medications   No medications on file     Physical Exam: Filed Vitals:   04/16/15 1438  BP: 135/75  Pulse: 89  Temp: 98.3 F (36.8 C)  TempSrc: Oral  Resp: 15  Height: 5\' 3"  (1.6 m)  Weight: 146 lb 12.8 oz (66.588 kg)    Physical Exam  Constitutional: She is oriented to person, place, and time. She appears well-developed and well-nourished. No distress.  HENT:  Head: Normocephalic and atraumatic.  Neck: Normal range of motion. Neck supple.  Cardiovascular: Normal rate and regular rhythm.   Murmur heard. Pulmonary/Chest: Effort normal and breath sounds normal. No respiratory distress.  Abdominal: Soft. Bowel sounds are normal. She exhibits no distension. There is no tenderness.  Rounded abdomen   Genitourinary: Vagina normal. There is no rash, tenderness, lesion or injury on the right labia. There is no rash, tenderness, lesion or injury on the left labia.  Musculoskeletal: She exhibits no edema or tenderness.  Right sided hemiparesis   Lymphadenopathy:    She has no cervical adenopathy.  Neurological: She is alert and oriented to person, place, and time.  Skin: Skin is warm and dry. She is not diaphoretic.    Psychiatric: She has a normal mood and affect.    Labs reviewed: Basic Metabolic Panel:  Recent Labs  11/03/14 02/08/15  NA 136* 134*  K 4.4 4.6  BUN 36* 25*  CREATININE 1.7* 0.9   Liver Function Tests:  Recent Labs  02/08/15  AST 17  ALT 13  ALKPHOS 62   No results for input(s): LIPASE, AMYLASE in the last 8760 hours. No results for input(s): AMMONIA in the last 8760 hours. CBC:  Recent Labs  11/03/14  WBC 7.6  HGB 10.7*  HCT 34*  PLT 276   TSH: No results for input(s): TSH in the last 8760 hours. A1C: Lab Results  Component Value Date   HGBA1C 5.3 02/08/2015   Lipid Panel:  Recent Labs  11/03/14 02/08/15  CHOL 146 143  HDL 35 39  LDLCALC 59 46  TRIG 259* 291*    Assessment/Plan  1. Constipation, unspecified constipation type Stable, irregular bowel movements but soft and pt without abdominal pain. Conts on amitiza   2. DM (diabetes mellitus), type 2 with neurological complications (HCC) -stable, A1c at 5.3, conts on metformin and levemir   3. Spastic hemiplegia affecting dominant side (HCC) Remains stable, without worsening of symptoms, conts on baclofen and robaxin with good control of symptoms  4. Overactive bladder Stable, cont vesicare  5. Essential hypertension -remains stable, cont current regimen   6. Generalized anxiety disorder -mood stable, no increase in anxiety or depression -conts on cymbalta 60 mg daily    Sadye Kiernan K. Harle Battiest  [facility] & Adult Medicine [phone] 8 am - 5 pm) [phone] (after hours)

## 2015-04-17 DIAGNOSIS — G8191 Hemiplegia, unspecified affecting right dominant side: Secondary | ICD-10-CM | POA: Diagnosis not present

## 2015-04-17 DIAGNOSIS — R262 Difficulty in walking, not elsewhere classified: Secondary | ICD-10-CM | POA: Diagnosis not present

## 2015-04-18 DIAGNOSIS — G8191 Hemiplegia, unspecified affecting right dominant side: Secondary | ICD-10-CM | POA: Diagnosis not present

## 2015-04-18 DIAGNOSIS — R262 Difficulty in walking, not elsewhere classified: Secondary | ICD-10-CM | POA: Diagnosis not present

## 2015-04-19 DIAGNOSIS — G8191 Hemiplegia, unspecified affecting right dominant side: Secondary | ICD-10-CM | POA: Diagnosis not present

## 2015-04-19 DIAGNOSIS — R262 Difficulty in walking, not elsewhere classified: Secondary | ICD-10-CM | POA: Diagnosis not present

## 2015-04-23 DIAGNOSIS — R262 Difficulty in walking, not elsewhere classified: Secondary | ICD-10-CM | POA: Diagnosis not present

## 2015-04-23 DIAGNOSIS — G8191 Hemiplegia, unspecified affecting right dominant side: Secondary | ICD-10-CM | POA: Diagnosis not present

## 2015-04-24 DIAGNOSIS — G8191 Hemiplegia, unspecified affecting right dominant side: Secondary | ICD-10-CM | POA: Diagnosis not present

## 2015-04-24 DIAGNOSIS — R262 Difficulty in walking, not elsewhere classified: Secondary | ICD-10-CM | POA: Diagnosis not present

## 2015-04-25 DIAGNOSIS — G8191 Hemiplegia, unspecified affecting right dominant side: Secondary | ICD-10-CM | POA: Diagnosis not present

## 2015-04-25 DIAGNOSIS — R262 Difficulty in walking, not elsewhere classified: Secondary | ICD-10-CM | POA: Diagnosis not present

## 2015-04-26 ENCOUNTER — Other Ambulatory Visit (HOSPITAL_COMMUNITY): Payer: Self-pay | Admitting: Interventional Radiology

## 2015-04-26 DIAGNOSIS — R262 Difficulty in walking, not elsewhere classified: Secondary | ICD-10-CM | POA: Diagnosis not present

## 2015-04-26 DIAGNOSIS — G8191 Hemiplegia, unspecified affecting right dominant side: Secondary | ICD-10-CM | POA: Diagnosis not present

## 2015-04-26 DIAGNOSIS — I771 Stricture of artery: Secondary | ICD-10-CM

## 2015-04-27 DIAGNOSIS — R262 Difficulty in walking, not elsewhere classified: Secondary | ICD-10-CM | POA: Diagnosis not present

## 2015-04-27 DIAGNOSIS — G8191 Hemiplegia, unspecified affecting right dominant side: Secondary | ICD-10-CM | POA: Diagnosis not present

## 2015-04-30 DIAGNOSIS — G8191 Hemiplegia, unspecified affecting right dominant side: Secondary | ICD-10-CM | POA: Diagnosis not present

## 2015-04-30 DIAGNOSIS — R262 Difficulty in walking, not elsewhere classified: Secondary | ICD-10-CM | POA: Diagnosis not present

## 2015-05-01 ENCOUNTER — Other Ambulatory Visit: Payer: Self-pay | Admitting: *Deleted

## 2015-05-01 DIAGNOSIS — R262 Difficulty in walking, not elsewhere classified: Secondary | ICD-10-CM | POA: Diagnosis not present

## 2015-05-01 DIAGNOSIS — G8191 Hemiplegia, unspecified affecting right dominant side: Secondary | ICD-10-CM | POA: Diagnosis not present

## 2015-05-01 MED ORDER — LORAZEPAM 0.5 MG PO TABS: ORAL_TABLET | ORAL | Status: DC

## 2015-05-01 NOTE — Telephone Encounter (Signed)
[facility]

## 2015-05-02 DIAGNOSIS — G8191 Hemiplegia, unspecified affecting right dominant side: Secondary | ICD-10-CM | POA: Diagnosis not present

## 2015-05-02 DIAGNOSIS — R262 Difficulty in walking, not elsewhere classified: Secondary | ICD-10-CM | POA: Diagnosis not present

## 2015-05-15 ENCOUNTER — Ambulatory Visit (HOSPITAL_COMMUNITY)
Admission: RE | Admit: 2015-05-15 | Discharge: 2015-05-15 | Disposition: A | Discharge status: Home / Self Care | Payer: Medicare Other | Source: Ambulatory Visit | Attending: Interventional Radiology | Admitting: Interventional Radiology

## 2015-05-15 DIAGNOSIS — I771 Stricture of artery: Secondary | ICD-10-CM | POA: Diagnosis not present

## 2015-05-15 NOTE — Progress Notes (Signed)
VASCULAR LAB PRELIMINARY  PRELIMINARY  PRELIMINARY  PRELIMINARY  Carotid duplex completed.      Right side -the carotid stent appears to be patent with velocities meeting the <50% diagnostic criteria for post carotid stent. 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  Bilateral- Vertebral Artery flow is antegrade.  Chelsey Gallagher, RVT, RDMS 05/15/2015, 11:57 AM

## 2015-05-16 LAB — VAS US CAROTID
LCCAPSYS: 81 cm/s
LEFT ECA DIAS: -14 cm/s
LEFT VERTEBRAL DIAS: 9 cm/s
LICAPSYS: -115 cm/s
Left CCA dist dias: -15 cm/s
Left CCA dist sys: -117 cm/s
Left CCA prox dias: 2 cm/s
Left ICA dist dias: -18 cm/s
Left ICA dist sys: -41 cm/s
Left ICA prox dias: -34 cm/s
RCCAPDIAS: 39 cm/s
RIGHT ECA DIAS: -13 cm/s
RIGHT VERTEBRAL DIAS: 31 cm/s
Right CCA prox sys: 159 cm/s

## 2015-05-18 ENCOUNTER — Encounter: Payer: Self-pay | Admitting: Adult Health

## 2015-05-18 ENCOUNTER — Non-Acute Institutional Stay (SKILLED_NURSING_FACILITY): Payer: Medicare Other | Admitting: Adult Health

## 2015-05-18 DIAGNOSIS — I6350 Cerebral infarction due to unspecified occlusion or stenosis of unspecified cerebral artery: Secondary | ICD-10-CM

## 2015-05-18 DIAGNOSIS — D509 Iron deficiency anemia, unspecified: Secondary | ICD-10-CM

## 2015-05-18 DIAGNOSIS — F329 Major depressive disorder, single episode, unspecified: Secondary | ICD-10-CM

## 2015-05-18 DIAGNOSIS — E785 Hyperlipidemia, unspecified: Secondary | ICD-10-CM | POA: Diagnosis not present

## 2015-05-18 DIAGNOSIS — E1149 Type 2 diabetes mellitus with other diabetic neurological complication: Secondary | ICD-10-CM | POA: Diagnosis not present

## 2015-05-18 DIAGNOSIS — K5900 Constipation, unspecified: Secondary | ICD-10-CM | POA: Diagnosis not present

## 2015-05-18 DIAGNOSIS — E1142 Type 2 diabetes mellitus with diabetic polyneuropathy: Secondary | ICD-10-CM | POA: Diagnosis not present

## 2015-05-18 DIAGNOSIS — I10 Essential (primary) hypertension: Secondary | ICD-10-CM

## 2015-05-18 DIAGNOSIS — F32A Depression, unspecified: Secondary | ICD-10-CM

## 2015-05-18 DIAGNOSIS — G8110 Spastic hemiplegia affecting unspecified side: Secondary | ICD-10-CM

## 2015-05-18 DIAGNOSIS — K5909 Other constipation: Secondary | ICD-10-CM

## 2015-05-18 NOTE — Progress Notes (Signed)
Patient ID: Chelsey Gallagher, female   DOB: 10-27-1950, 65 y.o.   MRN: [id number]   Facility: [name]       Allergies  Allergen Reactions  . Codeine Other (See Comments)    Per Mar  . Duloxetine Hcl   . Flexeril [Cyclobenzaprine Hcl] Other (See Comments)    Per Mar  . Lyrica [Pregabalin] Other (See Comments)    "wires my up"    Chief Complaint  Patient presents with  . Medical Management of Chronic Issues    Follow up    HPI:  She is a long term resident of this facility being seen for the management of her chornic illnesses. Overall her status is stable. She is complaining of constipation and is complaining of restless leg symptoms. There are no nursing concerns at this time.   Past Medical History  Diagnosis Date  . Diabetes mellitus   . Hypertension   . PAD (peripheral artery disease) ([place])     S/p bypass grafting, unclear exactly where  . Nephrolithiasis   . Hyperlipidemia 05/14/2011  . Hearing loss   . Complication of anesthesia     doesn't wake up good- "usually end up in ICU" also experiences post op nausea and vomiting  . Orthostatic hypotension     after surgery.  Marland Kitchen Heart murmur     PCP- [facility]- No tx needed.  . Stroke ([place]) 05/2011    Weakness on Right. Wears leg brace.  . Foot drop, left   . H/O hiatal hernia     second one  . Diabetic neuropathy (HCC)     Diabetic neuropathy- has inproved since she quit smoking.  . Arthritis   . Depression   . Lung abnormality 05/11/2011    Past Surgical History  Procedure Laterality Date  . Total hip arthroplasty  2008    Right  . Abdominal hysterectomy  2003  . Cesarean section  1984  . Cholecystectomy  1984  . Bypass graft  2003    Abdominal aortic  . Kidney stone surgery  1999  . Appendectomy  2011  . Hernia repair  2011  . Lithotripsy    . Abdominal hysterectomy    . Esophagogastroduodenoscopy N/A 05/09/2013    Procedure: ESOPHAGOGASTRODUODENOSCOPY (EGD);  Surgeon: Lafayette Dragon, MD;   Location: [facility] ENDOSCOPY;  Service: Endoscopy;  Laterality: N/A;  . Colonoscopy N/A 05/10/2013    Procedure: COLONOSCOPY;  Surgeon: Lafayette Dragon, MD;  Location: [facility] ENDOSCOPY;  Service: Endoscopy;  Laterality: N/A;    VITAL SIGNS BP 135/75 mmHg  Pulse 90  Temp(Src) 97.8 F (36.6 C) (Oral)  Resp 19  Ht 5\' 3"  (1.6 m)  Wt 151 lb 2 oz (68.55 kg)  BMI 26.78 kg/m2  Patient's Medications  New Prescriptions   No medications on file  Previous Medications   ACETAMINOPHEN (TYLENOL) 325 MG TABLET    Take 650 mg by mouth every 6 (six) hours as needed for pain.   ALBUTEROL (PROVENTIL) (2.5 MG/3ML) 0.083% NEBULIZER SOLUTION    Take 3 mLs (2.5 mg total) by nebulization every 4 (four) hours as needed for wheezing or shortness of breath.   ASPIRIN 81 MG CHEWABLE TABLET    Chew 81 mg by mouth daily.   BACLOFEN (LIORESAL) 10 MG TABLET    Take 5 mg by mouth 2 (two) times daily. 5 mg at 8a and 4pm   BACLOFEN (LIORESAL) 20 MG TABLET    Take 20 mg by mouth at bedtime.  CHOLECALCIFEROL (VITAMIN D) 1000 UNITS TABLET    Take 1,000 Units by mouth daily.   CLOPIDOGREL (PLAVIX) 75 MG TABLET    Take 75 mg by mouth daily.   DULOXETINE (CYMBALTA) 30 MG CAPSULE    Take 60 mg by mouth daily.   FERROUS SULFATE 325 (65 FE) MG TABLET    Take 325 mg by mouth.    GABAPENTIN (NEURONTIN) 300 MG CAPSULE    Take 600 mg by mouth at bedtime.    HYDRALAZINE (APRESOLINE) 25 MG TABLET    Take 1 tablet (25 mg total) by mouth every 8 (eight) hours.   INSULIN DETEMIR (LEVEMIR) 100 UNIT/ML INJECTION    Inject 12 Units into the skin at bedtime.   LORAZEPAM (ATIVAN) 0.5 MG TABLET    Take 1/2 tablet by mouth every morning for anxiety and take one tablet by mouth at bedtime for rest   LUBIPROSTONE (AMITIZA) 24 MCG CAPSULE    Take 24 mcg by mouth 2 (two) times daily with a meal.   MAGNESIUM HYDROXIDE (MILK OF MAGNESIA) 800 MG/5ML SUSPENSION    Take 30 mLs by mouth daily as needed for constipation. 30cc   MENTHOL, TOPICAL ANALGESIC,  (BIOFREEZE) 4 % GEL    To right ankle and bilateral knees TID   MENTHOL-CETYLPYRIDINIUM (CEPACOL) 3 MG LOZENGE    Take 1 lozenge by mouth every 4 (four) hours as needed for sore throat.   METFORMIN (GLUCOPHAGE) 500 MG TABLET    Take 1,000 mg by mouth 2 (two) times daily with a meal.    METHOCARBAMOL (ROBAXIN) 500 MG TABLET    Take 1 tablet (500 mg total) by mouth at bedtime.   NYSTATIN CREAM (MYCOSTATIN)    Apply to groin TID prn rash, reddness, or itching.   OMEGA-3 ACID ETHYL ESTERS (LOVAZA) 1 G CAPSULE    Take 2 g by mouth 2 (two) times daily.   POLYETHYLENE GLYCOL (MIRALAX / GLYCOLAX) PACKET    Take 17 g by mouth daily as needed for mild constipation.    PRAVASTATIN (PRAVACHOL) 40 MG TABLET    Take 40 mg by mouth daily.    PROMETHAZINE (PHENERGAN) 25 MG TABLET    Take either by mouth, IM injection, or suppository every 6 hours PRN for nausea or vomiting.   SENNOSIDES-DOCUSATE SODIUM (SENOKOT-S) 8.6-50 MG TABLET    Take 1 tablet by mouth daily.   SOLIFENACIN (VESICARE) 5 MG TABLET    Take 5 mg by mouth daily.   TRAMADOL (ULTRAM) 50 MG TABLET    Take two tablets by mouth every 8 hours as needed for pain   VITAMIN C (ASCORBIC ACID) 500 MG TABLET    Take 500 mg by mouth daily.   WHEAT DEXTRIN (BENEFIBER) POWD    Take 1 Applicatorful by mouth daily. Mix with 8oz of fluid  Modified Medications   No medications on file  Discontinued Medications   No medications on file     SIGNIFICANT DIAGNOSTIC EXAMS  LABS REVIEWED:   02-08-15: glucose 101; bun 25; creat 0.9; k+ 4.6; na++134; liver normal albumin 3.8; hgb a1c 5.3; chol 143; ldl 46; trig 291; hdl 39    Review of Systems  Constitutional: Negative for malaise/fatigue.  Respiratory: Negative for cough and shortness of breath.   Cardiovascular: Negative for chest pain, palpitations and leg swelling.  Gastrointestinal: Negative for heartburn, abdominal pain and constipation.  Musculoskeletal: Negative for myalgias, back pain and joint pain.    Skin: Negative.   Neurological: Negative for dizziness.  Psychiatric/Behavioral: The patient is not nervous/anxious.     Physical Exam  Constitutional: No distress.  Eyes: Conjunctivae are normal.  Neck: Neck supple. No JVD present. No thyromegaly present.  Cardiovascular: Normal rate, regular rhythm and intact distal pulses.   Murmur heard. Respiratory: Effort normal and breath sounds normal. No respiratory distress. She has no wheezes.  GI: Soft. Bowel sounds are normal. She exhibits distension. There is no tenderness.  Abdomen is rounded   Musculoskeletal: She exhibits no edema.  Right hemiparesis   Lymphadenopathy:    She has no cervical adenopathy.  Neurological: She is alert.  Skin: Skin is warm and dry. She is not diaphoretic.  Psychiatric: She has a normal mood and affect.      ASSESSMENT/ PLAN:  1.  CVA: has right hemiplegia: will continue asa 81 mg daily plavix 75 mg daily  Will continue baclofen 5 mg twice daily and 20 mg nightly for spasticity and takes robaxin 500 mg nightly for spasticity   2. Anemia: will continue iron daily   3. Dyslipidemia: will continue pravachol 40 mg daily lovaza 2 gm twice daily will begin fenofibrate 40 mg daily   4. 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 Diabetes: will continue levemir 12 units nightly; metformin 1 gm twice daily   6. Hypertension: will continue apresoline 50 mg three times daily   7. Depression with anxiety: will continue cymbalta 60 mg daily ; ativan will continue 0.25 mg in the AM and 0.5 mg nightly   8. Diabetic neuropathy: will continue neurontin 600 mg nightly   9. Constipation: will continue miralax daily as needed; senna s daily will stop the amitiza will begin linzess 145 mcg daily   10.Marland Kitchen RLS: will begin requip 0.25 mg nightly and will monitor    Time spent with patient  50   minutes >50% time spent counseling; reviewing medical record; tests; labs; and developing future plan of  care     Ok Edwards NP [facility]  Contact [phone] Monday through Friday 8am- 5pm  After hours call [phone]

## 2015-05-24 ENCOUNTER — Other Ambulatory Visit (HOSPITAL_COMMUNITY): Payer: Self-pay | Admitting: Interventional Radiology

## 2015-05-24 DIAGNOSIS — I771 Stricture of artery: Secondary | ICD-10-CM

## 2015-05-27 DIAGNOSIS — D509 Iron deficiency anemia, unspecified: Secondary | ICD-10-CM | POA: Diagnosis not present

## 2015-05-27 DIAGNOSIS — N390 Urinary tract infection, site not specified: Secondary | ICD-10-CM | POA: Diagnosis not present

## 2015-05-28 LAB — CBC AND DIFFERENTIAL
HCT: 25 % — AB (ref 36–46)
Hemoglobin: 7.6 g/dL — AB (ref 12.0–16.0)
PLATELETS: 322 10*3/uL (ref 150–399)
WBC: 7.5 10*3/mL

## 2015-05-29 ENCOUNTER — Other Ambulatory Visit: Payer: Self-pay | Admitting: *Deleted

## 2015-05-29 ENCOUNTER — Non-Acute Institutional Stay (SKILLED_NURSING_FACILITY): Payer: Medicare Other | Admitting: Adult Health

## 2015-05-29 ENCOUNTER — Encounter: Payer: Self-pay | Admitting: Adult Health

## 2015-05-29 DIAGNOSIS — K5900 Constipation, unspecified: Secondary | ICD-10-CM | POA: Diagnosis not present

## 2015-05-29 DIAGNOSIS — D509 Iron deficiency anemia, unspecified: Secondary | ICD-10-CM

## 2015-05-29 DIAGNOSIS — N3000 Acute cystitis without hematuria: Secondary | ICD-10-CM

## 2015-05-29 DIAGNOSIS — K5909 Other constipation: Secondary | ICD-10-CM

## 2015-05-29 MED ORDER — LORAZEPAM 0.5 MG PO TABS: ORAL_TABLET | ORAL | Status: DC

## 2015-05-29 NOTE — Progress Notes (Signed)
Patient ID: Chelsey Gallagher, female   DOB: 08/15/1950, 65 y.o.   MRN: [id number]   Facility[name]       Allergies  Allergen Reactions  . Codeine Other (See Comments)    Per Mar  . Duloxetine Hcl   . Flexeril [Cyclobenzaprine Hcl] Other (See Comments)    Per Mar  . Lyrica [Pregabalin] Other (See Comments)    "wires my up"    Chief Complaint  Patient presents with  . Acute Visit    Nursing Concerns    HPI:  Staff reports that she is more lethargic and is more confused. The staff did obtain an UA which is positive for enterococcus. She is having increased difficulty focusing on her conversations. She is complaining of constipation for which she is not getting any relief. Her anemia is worse with a hgb at 7.6.    Past Medical History  Diagnosis Date  . Diabetes mellitus   . Hypertension   . PAD (peripheral artery disease) ([place])     S/p bypass grafting, unclear exactly where  . Nephrolithiasis   . Hyperlipidemia 05/14/2011  . Hearing loss   . Complication of anesthesia     doesn't wake up good- "usually end up in ICU" also experiences post op nausea and vomiting  . Orthostatic hypotension     after surgery.  Marland Kitchen Heart murmur     [facility]- [facility]- No tx needed.  . Stroke ([place]) 05/2011    Weakness on Right. Wears leg brace.  . Foot drop, left   . H/O hiatal hernia     second one  . Diabetic neuropathy (HCC)     Diabetic neuropathy- has inproved since she quit smoking.  . Arthritis   . Depression   . Lung abnormality 05/11/2011    Past Surgical History  Procedure Laterality Date  . Total hip arthroplasty  2008    Right  . Abdominal hysterectomy  2003  . Cesarean section  1984  . Cholecystectomy  1984  . Bypass graft  2003    Abdominal aortic  . Kidney stone surgery  1999  . Appendectomy  2011  . Hernia repair  2011  . Lithotripsy    . Abdominal hysterectomy    . Esophagogastroduodenoscopy N/A 05/09/2013    Procedure: ESOPHAGOGASTRODUODENOSCOPY  (EGD);  Surgeon: Lafayette Dragon, MD;  Location: [facility] ENDOSCOPY;  Service: Endoscopy;  Laterality: N/A;  . Colonoscopy N/A 05/10/2013    Procedure: COLONOSCOPY;  Surgeon: Lafayette Dragon, MD;  Location: [facility] ENDOSCOPY;  Service: Endoscopy;  Laterality: N/A;    VITAL SIGNS BP 135/75 mmHg  Pulse 100  Temp(Src) 99.9 F (37.7 C) (Oral)  Resp 18  Ht 5\' 3"  (1.6 m)  Wt 151 lb 2 oz (68.55 kg)  BMI 26.78 kg/m2  Patient's Medications  New Prescriptions   No medications on file  Previous Medications   ACETAMINOPHEN (TYLENOL) 325 MG TABLET    Take 650 mg by mouth every 6 (six) hours as needed for pain.   ALBUTEROL (PROVENTIL) (2.5 MG/3ML) 0.083% NEBULIZER SOLUTION    Take 3 mLs (2.5 mg total) by nebulization every 4 (four) hours as needed for wheezing or shortness of breath.   ASPIRIN 81 MG CHEWABLE TABLET    Chew 81 mg by mouth daily.   BACLOFEN (LIORESAL) 10 MG TABLET    Take 5 mg by mouth 2 (two) times daily. 5 mg at 8a and 4pm   BACLOFEN (LIORESAL) 20 MG TABLET    Take  20 mg by mouth at bedtime.   CHOLECALCIFEROL (VITAMIN D) 1000 UNITS TABLET    Take 1,000 Units by mouth daily.   CLOPIDOGREL (PLAVIX) 75 MG TABLET    Take 75 mg by mouth daily.   DULOXETINE (CYMBALTA) 30 MG CAPSULE    Take 60 mg by mouth daily.   FENOFIBRATE 40 MG TABS    Take 40 mg by mouth daily.   FERROUS SULFATE 325 (65 FE) MG TABLET    Take 325 mg by mouth.    GABAPENTIN (NEURONTIN) 300 MG CAPSULE    Take 600 mg by mouth at bedtime.    HYDRALAZINE (APRESOLINE) 50 MG TABLET    Take 50 mg by mouth 3 (three) times daily.   INSULIN DETEMIR (LEVEMIR) 100 UNIT/ML INJECTION    Inject 12 Units into the skin at bedtime.   LINACLOTIDE (LINZESS) 145 MCG CAPS CAPSULE    Take 145 mcg by mouth daily. Give 30 minutes prior to first meal.   LORAZEPAM (ATIVAN) 0.5 MG TABLET    Take 1/2 tablet by mouth every morning for anxiety and take one tablet by mouth at bedtime for rest   MAGNESIUM HYDROXIDE (MILK OF MAGNESIA) 800 MG/5ML SUSPENSION    Take  30 mLs by mouth daily as needed for constipation. 30cc   MENTHOL, TOPICAL ANALGESIC, (BIOFREEZE) 4 % GEL    To right ankle and bilateral knees TID   METFORMIN (GLUCOPHAGE) 500 MG TABLET    Take 1,000 mg by mouth 2 (two) times daily with a meal.    METHOCARBAMOL (ROBAXIN) 500 MG TABLET    Take 1 tablet (500 mg total) by mouth at bedtime.   NYSTATIN CREAM (MYCOSTATIN)    Apply to groin TID prn rash, reddness, or itching.   OMEGA-3 ACID ETHYL ESTERS (LOVAZA) 1 G CAPSULE    Take 2 g by mouth 2 (two) times daily.   POLYETHYLENE GLYCOL (MIRALAX / GLYCOLAX) PACKET    Take 17 g by mouth daily as needed for mild constipation.    PRAVASTATIN (PRAVACHOL) 40 MG TABLET    Take 40 mg by mouth daily.    PROMETHAZINE (PHENERGAN) 25 MG TABLET    Take either by mouth, IM injection, or suppository every 6 hours PRN for nausea or vomiting.   PROPYLENE GLYCOL (SYSTANE BALANCE) 0.6 % SOLN    Place 1 drop into both eyes 2 (two) times daily.   ROPINIROLE (REQUIP) 0.25 MG TABLET    Take 0.25 mg by mouth at bedtime.   SENNOSIDES-DOCUSATE SODIUM (SENOKOT-S) 8.6-50 MG TABLET    Take 1 tablet by mouth daily.   SOLIFENACIN (VESICARE) 5 MG TABLET    Take 5 mg by mouth daily.   TRAMADOL (ULTRAM) 50 MG TABLET    Take two tablets by mouth every 8 hours as needed for pain   VITAMIN C (ASCORBIC ACID) 500 MG TABLET    Take 500 mg by mouth daily.   WHEAT DEXTRIN (BENEFIBER) POWD    Take 1 Applicatorful by mouth daily. Mix with 8oz of fluid  Modified Medications   No medications on file  Discontinued Medications     SIGNIFICANT DIAGNOSTIC EXAMS  LABS REVIEWED:   02-08-15: glucose 101; bun 25; creat 0.9; k+ 4.6; na++134; liver normal albumin 3.8; hgb a1c 5.3; chol 143; ldl 46; trig 291; hdl 39 05-27-15: wbc 7.5; hgb 7.6; hct 25.3; mcv 75.5 ;plt 322 05-29-15: urine culture: preliminary is enterococcus      Review of Systems  Constitutional: has fatigue  Respiratory: Negative for cough and shortness of breath.     Cardiovascular: Negative for chest pain, palpitations and leg swelling.  Gastrointestinal: Negative for heartburn, has abdominal pain and constipation Musculoskeletal: Negative for myalgias, back pain and joint pain.  Skin: Negative.   Neurological: Negative for dizziness.  Psychiatric/Behavioral: The patient is not nervous/anxious.     Physical Exam  Constitutional: No distress.  Eyes: Conjunctivae are normal.  Neck: Neck supple. No JVD present. No thyromegaly present.  Cardiovascular: Normal rate, regular rhythm and intact distal pulses.   Murmur heard. Respiratory: Effort normal and breath sounds normal. No respiratory distress. She has no wheezes.  GI: Soft. Bowel sounds are normal. She exhibits distension. There is no tenderness.  Abdomen is rounded   Musculoskeletal: She exhibits no edema.  Right hemiparesis   Lymphadenopathy:    She has no cervical adenopathy.  Neurological: She is alert.  Skin: Skin is warm and dry. She is not diaphoretic.  Psychiatric: She has a normal mood and affect.      ASSESSMENT/ PLAN:  1. Constipation: will continue miralax daily as needed; senna s daily will increase linzess to 290 mcg daily    2. UTI: will begin augmentin 875 mg twice daily for one week with florastor; pending urine culture reports.  3. Anemia; iron deficiency: is worse hgb is 7.6; will guaiac stools X3; will check iron studies and will repeat cbc.            Ok Edwards NP [facility]  Contact [phone] Monday through Friday 8am- 5pm  After hours call [phone]

## 2015-05-29 NOTE — Telephone Encounter (Signed)
[facility] 

## 2015-05-30 DIAGNOSIS — D649 Anemia, unspecified: Secondary | ICD-10-CM | POA: Diagnosis not present

## 2015-05-31 ENCOUNTER — Encounter: Payer: Self-pay | Admitting: Adult Health

## 2015-05-31 ENCOUNTER — Non-Acute Institutional Stay (SKILLED_NURSING_FACILITY): Payer: Medicare Other | Admitting: Adult Health

## 2015-05-31 DIAGNOSIS — D509 Iron deficiency anemia, unspecified: Secondary | ICD-10-CM | POA: Diagnosis not present

## 2015-05-31 DIAGNOSIS — N3000 Acute cystitis without hematuria: Secondary | ICD-10-CM | POA: Diagnosis not present

## 2015-05-31 NOTE — Progress Notes (Signed)
Patient ID: Chelsey Gallagher, female   DOB: 01-Oct-1950, 65 y.o.   MRN: [id number]   [name]       Allergies  Allergen Reactions  . Codeine Other (See Comments)    Per Mar  . Duloxetine Hcl   . Flexeril [Cyclobenzaprine Hcl] Other (See Comments)    Per Mar  . Lyrica [Pregabalin] Other (See Comments)    "wires my up"    Chief Complaint  Patient presents with  . Acute Visit    Anemia    HPI:  Her hgb is 7.3; her digital rectal exam was + guaiac. She is not voicing any complaints is on abt for a uti; stating that she is starting to feel better.   Past Medical History  Diagnosis Date  . Diabetes mellitus   . Hypertension   . PAD (peripheral artery disease) ([place])     S/p bypass grafting, unclear exactly where  . Nephrolithiasis   . Hyperlipidemia 05/14/2011  . Hearing loss   . Complication of anesthesia     doesn't wake up good- "usually end up in ICU" also experiences post op nausea and vomiting  . Orthostatic hypotension     after surgery.  Marland Kitchen Heart murmur     PCP- [facility]- No tx needed.  . Stroke ([place]) 05/2011    Weakness on Right. Wears leg brace.  . Foot drop, left   . H/O hiatal hernia     second one  . Diabetic neuropathy (HCC)     Diabetic neuropathy- has inproved since she quit smoking.  . Arthritis   . Depression   . Lung abnormality 05/11/2011    Past Surgical History  Procedure Laterality Date  . Total hip arthroplasty  2008    Right  . Abdominal hysterectomy  2003  . Cesarean section  1984  . Cholecystectomy  1984  . Bypass graft  2003    Abdominal aortic  . Kidney stone surgery  1999  . Appendectomy  2011  . Hernia repair  2011  . Lithotripsy    . Abdominal hysterectomy    . Esophagogastroduodenoscopy N/A 05/09/2013    Procedure: ESOPHAGOGASTRODUODENOSCOPY (EGD);  Surgeon: Lafayette Dragon, MD;  Location: [facility] ENDOSCOPY;  Service: Endoscopy;  Laterality: N/A;  . Colonoscopy N/A 05/10/2013    Procedure: COLONOSCOPY;  Surgeon:  Lafayette Dragon, MD;  Location: [facility] ENDOSCOPY;  Service: Endoscopy;  Laterality: N/A;    VITAL SIGNS BP 134/88 mmHg  Pulse 88  Temp(Src) 98.5 F (36.9 C) (Oral)  Resp 18  Ht 5\' 3"  (1.6 m)  Wt 151 lb 2 oz (68.55 kg)  BMI 26.78 kg/m2  Patient's Medications  New Prescriptions   No medications on file  Previous Medications   ACETAMINOPHEN (TYLENOL) 325 MG TABLET    Take 650 mg by mouth every 6 (six) hours as needed for pain.   ALBUTEROL (PROVENTIL) (2.5 MG/3ML) 0.083% NEBULIZER SOLUTION    Take 3 mLs (2.5 mg total) by nebulization every 4 (four) hours as needed for wheezing or shortness of breath.   AMOXICILLIN-CLAVULANATE (AUGMENTIN) 875-125 MG TABLET    Take 1 tablet by mouth 2 (two) times daily.   ASPIRIN 81 MG CHEWABLE TABLET    Chew 81 mg by mouth daily.   BACLOFEN (LIORESAL) 10 MG TABLET    Take 5 mg by mouth 2 (two) times daily. 5 mg at 8a and 4pm   BACLOFEN (LIORESAL) 20 MG TABLET    Take 20 mg by mouth at  bedtime.   CHOLECALCIFEROL (VITAMIN D) 1000 UNITS TABLET    Take 1,000 Units by mouth daily.   CLOPIDOGREL (PLAVIX) 75 MG TABLET    Take 75 mg by mouth daily.   DULOXETINE (CYMBALTA) 30 MG CAPSULE    Take 60 mg by mouth daily.   FENOFIBRATE 40 MG TABS    Take 40 mg by mouth daily.   FERROUS SULFATE 325 (65 FE) MG TABLET    Take 325 mg by mouth.    GABAPENTIN (NEURONTIN) 300 MG CAPSULE    Take 600 mg by mouth at bedtime.    HYDRALAZINE (APRESOLINE) 50 MG TABLET    Take 50 mg by mouth 3 (three) times daily.   INSULIN DETEMIR (LEVEMIR) 100 UNIT/ML INJECTION    Inject 12 Units into the skin at bedtime.   LINACLOTIDE (LINZESS) 290 MCG CAPS CAPSULE    Take 290 mcg by mouth daily.   LORAZEPAM (ATIVAN) 0.5 MG TABLET    Take 1/2 tablet by mouth every morning for anxiety and take one tablet by mouth at bedtime for rest   LUBIPROSTONE (AMITIZA) 24 MCG CAPSULE    Take 24 mcg by mouth 2 (two) times daily with a meal. Reported on 05/29/2015   MAGNESIUM HYDROXIDE (MILK OF MAGNESIA) 800 MG/5ML  SUSPENSION    Take 30 mLs by mouth daily as needed for constipation. 30cc   MENTHOL, TOPICAL ANALGESIC, (BIOFREEZE) 4 % GEL    To right ankle and bilateral knees TID   METFORMIN (GLUCOPHAGE) 500 MG TABLET    Take 1,000 mg by mouth 2 (two) times daily with a meal.    METHOCARBAMOL (ROBAXIN) 500 MG TABLET    Take 1 tablet (500 mg total) by mouth at bedtime.   NYSTATIN CREAM (MYCOSTATIN)    Apply to groin TID prn rash, reddness, or itching.   OMEGA-3 ACID ETHYL ESTERS (LOVAZA) 1 G CAPSULE    Take 2 g by mouth 2 (two) times daily.   POLYETHYLENE GLYCOL (MIRALAX / GLYCOLAX) PACKET    Take 17 g by mouth daily as needed for mild constipation.    PRAVASTATIN (PRAVACHOL) 40 MG TABLET    Take 40 mg by mouth daily.    PROMETHAZINE (PHENERGAN) 25 MG TABLET    Take either by mouth, IM injection, or suppository every 6 hours PRN for nausea or vomiting.   PROPYLENE GLYCOL (SYSTANE BALANCE) 0.6 % SOLN    Place 1 drop into both eyes 2 (two) times daily.   ROPINIROLE (REQUIP) 0.25 MG TABLET    Take 0.25 mg by mouth at bedtime.   SACCHAROMYCES BOULARDII (FLORASTOR PO)    Take by mouth. BID x 2 wks   SENNOSIDES-DOCUSATE SODIUM (SENOKOT-S) 8.6-50 MG TABLET    Take 1 tablet by mouth daily.   SOLIFENACIN (VESICARE) 5 MG TABLET    Take 5 mg by mouth daily.   TRAMADOL (ULTRAM) 50 MG TABLET    Take two tablets by mouth every 8 hours as needed for pain   VITAMIN C (ASCORBIC ACID) 500 MG TABLET    Take 500 mg by mouth daily.   WHEAT DEXTRIN (BENEFIBER) POWD    Take 1 Applicatorful by mouth daily. Mix with 8oz of fluid  Modified Medications   No medications on file  Discontinued Medications   LINACLOTIDE (LINZESS) 145 MCG CAPS CAPSULE    Take 145 mcg by mouth daily. Reported on 05/31/2015     SIGNIFICANT DIAGNOSTIC EXAMS  LABS REVIEWED:   02-08-15: glucose 101; bun 25; creat  0.9; k+ 4.6; na++134; liver normal albumin 3.8; hgb a1c 5.3; chol 143; ldl 46; trig 291; hdl 39  05-31-15; wbc 5.8; hgb 7.3; hct 24.1; mcv 75.8;  plt 248; iron 17; tibc 388   Review of Systems  Constitutional: Negative for malaise/fatigue.  Respiratory: Negative for cough and shortness of breath.   Cardiovascular: Negative for chest pain, palpitations and leg swelling.  Gastrointestinal: Negative for heartburn, abdominal pain and constipation.  Musculoskeletal: Negative for myalgias, back pain and joint pain.  Skin: Negative.   Neurological: Negative for dizziness.  Psychiatric/Behavioral: The patient is not nervous/anxious.     Physical Exam  Constitutional: No distress.  Eyes: Conjunctivae are normal.  Neck: Neck supple. No JVD present. No thyromegaly present.  Cardiovascular: Normal rate, regular rhythm and intact distal pulses.   Murmur heard. Respiratory: Effort normal and breath sounds normal. No respiratory distress. She has no wheezes.  GI: Soft. Bowel sounds are normal. She exhibits distension. There is no tenderness.  Abdomen is rounded   Musculoskeletal: She exhibits no edema.  Right hemiparesis   Lymphadenopathy:    She has no cervical adenopathy.  Neurological: She is alert.  Skin: Skin is warm and dry. She is not diaphoretic.  Psychiatric: She has a normal mood and affect.      ASSESSMENT/ PLAN:   1. Anemia: will increase iron to twice daily will have staff setup GI consult for her + guaiac. Will monitor her status.   2. UTI: will complete augmentin and will monitor her status.     Ok Edwards NP [facility]  Contact [phone] Monday through Friday 8am- 5pm  After hours call [phone]

## 2015-06-02 DIAGNOSIS — Z9289 Personal history of other medical treatment: Secondary | ICD-10-CM

## 2015-06-02 DIAGNOSIS — G8191 Hemiplegia, unspecified affecting right dominant side: Secondary | ICD-10-CM | POA: Diagnosis not present

## 2015-06-02 DIAGNOSIS — R262 Difficulty in walking, not elsewhere classified: Secondary | ICD-10-CM | POA: Diagnosis not present

## 2015-06-02 HISTORY — DX: Personal history of other medical treatment: Z92.89

## 2015-06-04 ENCOUNTER — Encounter (HOSPITAL_COMMUNITY): Payer: Self-pay | Admitting: Emergency Medicine

## 2015-06-04 ENCOUNTER — Emergency Department (HOSPITAL_COMMUNITY): Payer: Medicare Other

## 2015-06-04 ENCOUNTER — Inpatient Hospital Stay (HOSPITAL_COMMUNITY)
Admission: EM | Admit: 2015-06-04 | Discharge: 2015-06-07 | DRG: 092 | Disposition: A | Discharge status: Skilled Nursing Facility | Payer: Medicare Other | Attending: Internal Medicine | Admitting: Internal Medicine

## 2015-06-04 DIAGNOSIS — Z809 Family history of malignant neoplasm, unspecified: Secondary | ICD-10-CM

## 2015-06-04 DIAGNOSIS — N390 Urinary tract infection, site not specified: Secondary | ICD-10-CM | POA: Diagnosis present

## 2015-06-04 DIAGNOSIS — K920 Hematemesis: Secondary | ICD-10-CM | POA: Diagnosis present

## 2015-06-04 DIAGNOSIS — M2450 Contracture, unspecified joint: Secondary | ICD-10-CM | POA: Diagnosis present

## 2015-06-04 DIAGNOSIS — R195 Other fecal abnormalities: Secondary | ICD-10-CM

## 2015-06-04 DIAGNOSIS — G928 Other toxic encephalopathy: Secondary | ICD-10-CM | POA: Diagnosis present

## 2015-06-04 DIAGNOSIS — E785 Hyperlipidemia, unspecified: Secondary | ICD-10-CM | POA: Diagnosis not present

## 2015-06-04 DIAGNOSIS — D649 Anemia, unspecified: Secondary | ICD-10-CM

## 2015-06-04 DIAGNOSIS — E1149 Type 2 diabetes mellitus with other diabetic neurological complication: Secondary | ICD-10-CM

## 2015-06-04 DIAGNOSIS — F319 Bipolar disorder, unspecified: Secondary | ICD-10-CM | POA: Diagnosis present

## 2015-06-04 DIAGNOSIS — Z96649 Presence of unspecified artificial hip joint: Secondary | ICD-10-CM | POA: Diagnosis present

## 2015-06-04 DIAGNOSIS — Z90710 Acquired absence of both cervix and uterus: Secondary | ICD-10-CM

## 2015-06-04 DIAGNOSIS — R112 Nausea with vomiting, unspecified: Secondary | ICD-10-CM | POA: Diagnosis present

## 2015-06-04 DIAGNOSIS — I10 Essential (primary) hypertension: Secondary | ICD-10-CM | POA: Diagnosis present

## 2015-06-04 DIAGNOSIS — Z7902 Long term (current) use of antithrombotics/antiplatelets: Secondary | ICD-10-CM

## 2015-06-04 DIAGNOSIS — D500 Iron deficiency anemia secondary to blood loss (chronic): Secondary | ICD-10-CM | POA: Insufficient documentation

## 2015-06-04 DIAGNOSIS — K922 Gastrointestinal hemorrhage, unspecified: Secondary | ICD-10-CM | POA: Diagnosis present

## 2015-06-04 DIAGNOSIS — Z9049 Acquired absence of other specified parts of digestive tract: Secondary | ICD-10-CM | POA: Diagnosis not present

## 2015-06-04 DIAGNOSIS — Z79899 Other long term (current) drug therapy: Secondary | ICD-10-CM

## 2015-06-04 DIAGNOSIS — I6523 Occlusion and stenosis of bilateral carotid arteries: Secondary | ICD-10-CM | POA: Diagnosis present

## 2015-06-04 DIAGNOSIS — R4781 Slurred speech: Secondary | ICD-10-CM | POA: Diagnosis present

## 2015-06-04 DIAGNOSIS — G2581 Restless legs syndrome: Secondary | ICD-10-CM | POA: Diagnosis present

## 2015-06-04 DIAGNOSIS — K5900 Constipation, unspecified: Secondary | ICD-10-CM | POA: Diagnosis present

## 2015-06-04 DIAGNOSIS — E119 Type 2 diabetes mellitus without complications: Secondary | ICD-10-CM | POA: Diagnosis not present

## 2015-06-04 DIAGNOSIS — S299XXA Unspecified injury of thorax, initial encounter: Secondary | ICD-10-CM | POA: Diagnosis not present

## 2015-06-04 DIAGNOSIS — Z888 Allergy status to other drugs, medicaments and biological substances status: Secondary | ICD-10-CM

## 2015-06-04 DIAGNOSIS — Z96641 Presence of right artificial hip joint: Secondary | ICD-10-CM | POA: Diagnosis present

## 2015-06-04 DIAGNOSIS — Z66 Do not resuscitate: Secondary | ICD-10-CM | POA: Diagnosis present

## 2015-06-04 DIAGNOSIS — S0990XA Unspecified injury of head, initial encounter: Secondary | ICD-10-CM | POA: Diagnosis not present

## 2015-06-04 DIAGNOSIS — E1140 Type 2 diabetes mellitus with diabetic neuropathy, unspecified: Secondary | ICD-10-CM | POA: Diagnosis present

## 2015-06-04 DIAGNOSIS — I159 Secondary hypertension, unspecified: Secondary | ICD-10-CM | POA: Diagnosis not present

## 2015-06-04 DIAGNOSIS — W06XXXA Fall from bed, initial encounter: Secondary | ICD-10-CM | POA: Diagnosis present

## 2015-06-04 DIAGNOSIS — Z885 Allergy status to narcotic agent status: Secondary | ICD-10-CM

## 2015-06-04 DIAGNOSIS — M6281 Muscle weakness (generalized): Secondary | ICD-10-CM | POA: Diagnosis not present

## 2015-06-04 DIAGNOSIS — S8991XA Unspecified injury of right lower leg, initial encounter: Secondary | ICD-10-CM | POA: Diagnosis not present

## 2015-06-04 DIAGNOSIS — N179 Acute kidney failure, unspecified: Secondary | ICD-10-CM | POA: Diagnosis not present

## 2015-06-04 DIAGNOSIS — R32 Unspecified urinary incontinence: Secondary | ICD-10-CM | POA: Diagnosis present

## 2015-06-04 DIAGNOSIS — G92 Toxic encephalopathy: Secondary | ICD-10-CM | POA: Diagnosis not present

## 2015-06-04 DIAGNOSIS — Z9181 History of falling: Secondary | ICD-10-CM | POA: Diagnosis not present

## 2015-06-04 DIAGNOSIS — D509 Iron deficiency anemia, unspecified: Secondary | ICD-10-CM | POA: Diagnosis not present

## 2015-06-04 DIAGNOSIS — H9190 Unspecified hearing loss, unspecified ear: Secondary | ICD-10-CM | POA: Diagnosis present

## 2015-06-04 DIAGNOSIS — M21372 Foot drop, left foot: Secondary | ICD-10-CM | POA: Diagnosis present

## 2015-06-04 DIAGNOSIS — Z7982 Long term (current) use of aspirin: Secondary | ICD-10-CM

## 2015-06-04 DIAGNOSIS — S199XXA Unspecified injury of neck, initial encounter: Secondary | ICD-10-CM | POA: Diagnosis not present

## 2015-06-04 DIAGNOSIS — N12 Tubulo-interstitial nephritis, not specified as acute or chronic: Secondary | ICD-10-CM | POA: Diagnosis present

## 2015-06-04 DIAGNOSIS — Z794 Long term (current) use of insulin: Secondary | ICD-10-CM

## 2015-06-04 DIAGNOSIS — I69351 Hemiplegia and hemiparesis following cerebral infarction affecting right dominant side: Secondary | ICD-10-CM | POA: Diagnosis not present

## 2015-06-04 DIAGNOSIS — Z87891 Personal history of nicotine dependence: Secondary | ICD-10-CM | POA: Diagnosis not present

## 2015-06-04 DIAGNOSIS — M24541 Contracture, right hand: Secondary | ICD-10-CM | POA: Diagnosis not present

## 2015-06-04 DIAGNOSIS — E1151 Type 2 diabetes mellitus with diabetic peripheral angiopathy without gangrene: Secondary | ICD-10-CM | POA: Diagnosis present

## 2015-06-04 DIAGNOSIS — R4182 Altered mental status, unspecified: Secondary | ICD-10-CM

## 2015-06-04 DIAGNOSIS — R262 Difficulty in walking, not elsewhere classified: Secondary | ICD-10-CM | POA: Diagnosis not present

## 2015-06-04 DIAGNOSIS — G9340 Encephalopathy, unspecified: Secondary | ICD-10-CM | POA: Diagnosis not present

## 2015-06-04 DIAGNOSIS — G8191 Hemiplegia, unspecified affecting right dominant side: Secondary | ICD-10-CM | POA: Diagnosis not present

## 2015-06-04 DIAGNOSIS — W19XXXA Unspecified fall, initial encounter: Secondary | ICD-10-CM

## 2015-06-04 DIAGNOSIS — S79912A Unspecified injury of left hip, initial encounter: Secondary | ICD-10-CM | POA: Diagnosis not present

## 2015-06-04 DIAGNOSIS — Z95828 Presence of other vascular implants and grafts: Secondary | ICD-10-CM | POA: Diagnosis not present

## 2015-06-04 DIAGNOSIS — S064X0A Epidural hemorrhage without loss of consciousness, initial encounter: Secondary | ICD-10-CM | POA: Diagnosis not present

## 2015-06-04 HISTORY — DX: Iron deficiency anemia, unspecified: D50.9

## 2015-06-04 HISTORY — DX: Personal history of other medical treatment: Z92.89

## 2015-06-04 HISTORY — DX: Gastrointestinal hemorrhage, unspecified: K92.2

## 2015-06-04 LAB — COMPREHENSIVE METABOLIC PANEL
ALT: 14 U/L (ref 14–54)
ANION GAP: 9 (ref 5–15)
AST: 18 U/L (ref 15–41)
Albumin: 3.4 g/dL — ABNORMAL LOW (ref 3.5–5.0)
Alkaline Phosphatase: 55 U/L (ref 38–126)
BUN: 36 mg/dL — ABNORMAL HIGH (ref 6–20)
CALCIUM: 9.8 mg/dL (ref 8.9–10.3)
CHLORIDE: 107 mmol/L (ref 101–111)
CO2: 26 mmol/L (ref 22–32)
Creatinine, Ser: 1.83 mg/dL — ABNORMAL HIGH (ref 0.44–1.00)
GFR, EST AFRICAN AMERICAN: 33 mL/min — AB (ref >60)
GFR, EST NON AFRICAN AMERICAN: 28 mL/min — AB (ref >60)
Glucose, Bld: 105 mg/dL — ABNORMAL HIGH (ref 65–99)
Potassium: 4.5 mmol/L (ref 3.5–5.1)
SODIUM: 142 mmol/L (ref 135–145)
TOTAL PROTEIN: 7.3 g/dL (ref 6.5–8.1)
Total Bilirubin: 0.4 mg/dL (ref 0.3–1.2)

## 2015-06-04 LAB — CBC WITH DIFFERENTIAL/PLATELET
BASOS PCT: 0 %
Basophils Absolute: 0 10*3/uL (ref 0.0–0.1)
EOS ABS: 0.1 10*3/uL (ref 0.0–0.7)
Eosinophils Relative: 1 %
HCT: 30.8 % — ABNORMAL LOW (ref 36.0–46.0)
HEMOGLOBIN: 8.7 g/dL — AB (ref 12.0–15.0)
Lymphocytes Relative: 19 %
Lymphs Abs: 1.4 10*3/uL (ref 0.7–4.0)
MCH: 21.9 pg — ABNORMAL LOW (ref 26.0–34.0)
MCHC: 28.2 g/dL — AB (ref 30.0–36.0)
MCV: 77.4 fL — ABNORMAL LOW (ref 78.0–100.0)
MONOS PCT: 5 %
Monocytes Absolute: 0.4 10*3/uL (ref 0.1–1.0)
NEUTROS PCT: 75 %
Neutro Abs: 5.5 10*3/uL (ref 1.7–7.7)
PLATELETS: 307 10*3/uL (ref 150–400)
RBC: 3.98 MIL/uL (ref 3.87–5.11)
RDW: 18.5 % — AB (ref 11.5–15.5)
WBC: 7.3 10*3/uL (ref 4.0–10.5)

## 2015-06-04 LAB — URINALYSIS, ROUTINE W REFLEX MICROSCOPIC
BILIRUBIN URINE: NEGATIVE
Glucose, UA: NEGATIVE mg/dL
HGB URINE DIPSTICK: NEGATIVE
Ketones, ur: NEGATIVE mg/dL
Nitrite: NEGATIVE
PROTEIN: 30 mg/dL — AB
Specific Gravity, Urine: 1.011 (ref 1.005–1.030)
pH: 7 (ref 5.0–8.0)

## 2015-06-04 LAB — URINE MICROSCOPIC-ADD ON: RBC / HPF: NONE SEEN RBC/hpf (ref 0–5)

## 2015-06-04 LAB — I-STAT ARTERIAL BLOOD GAS, ED
ACID-BASE EXCESS: 1 mmol/L (ref 0.0–2.0)
BICARBONATE: 26.8 meq/L — AB (ref 20.0–24.0)
O2 Saturation: 91 %
PH ART: 7.365 (ref 7.350–7.450)
TCO2: 28 mmol/L (ref 0–100)
pCO2 arterial: 46.8 mmHg — ABNORMAL HIGH (ref 35.0–45.0)
pO2, Arterial: 63 mmHg — ABNORMAL LOW (ref 80.0–100.0)

## 2015-06-04 LAB — I-STAT CHEM 8, ED
BUN: 38 mg/dL — ABNORMAL HIGH (ref 6–20)
Calcium, Ion: 1.3 mmol/L (ref 1.13–1.30)
Chloride: 104 mmol/L (ref 101–111)
Creatinine, Ser: 1.9 mg/dL — ABNORMAL HIGH (ref 0.44–1.00)
Glucose, Bld: 106 mg/dL — ABNORMAL HIGH (ref 65–99)
HEMATOCRIT: 32 % — AB (ref 36.0–46.0)
HEMOGLOBIN: 10.9 g/dL — AB (ref 12.0–15.0)
Potassium: 4.9 mmol/L (ref 3.5–5.1)
SODIUM: 142 mmol/L (ref 135–145)
TCO2: 27 mmol/L (ref 0–100)

## 2015-06-04 LAB — I-STAT TROPONIN, ED: TROPONIN I, POC: 0.02 ng/mL (ref 0.00–0.08)

## 2015-06-04 LAB — POC OCCULT BLOOD, ED: Fecal Occult Bld: POSITIVE — AB

## 2015-06-04 LAB — PROTIME-INR
INR: 1.11 (ref 0.00–1.49)
Prothrombin Time: 14.5 seconds (ref 11.6–15.2)

## 2015-06-04 LAB — I-STAT CG4 LACTIC ACID, ED
LACTIC ACID, VENOUS: 1.08 mmol/L (ref 0.5–2.0)
Lactic Acid, Venous: 1.11 mmol/L (ref 0.5–2.0)

## 2015-06-04 LAB — APTT: aPTT: 30 seconds (ref 24–37)

## 2015-06-04 MED ORDER — SODIUM CHLORIDE 0.9 % IV SOLN
INTRAVENOUS | Status: DC
  Administered 2015-06-04: 19:00:00 via INTRAVENOUS

## 2015-06-04 MED ORDER — INSULIN DETEMIR 100 UNIT/ML ~~LOC~~ SOLN
6.0000 [IU] | Freq: Every day | SUBCUTANEOUS | Status: DC
  Administered 2015-06-05 – 2015-06-06 (×3): 6 [IU] via SUBCUTANEOUS
  Filled 2015-06-04 (×4): qty 0.06

## 2015-06-04 MED ORDER — DEXTROSE 5 % IV SOLN
1.0000 g | Freq: Once | INTRAVENOUS | Status: AC
  Administered 2015-06-04: 1 g via INTRAVENOUS
  Filled 2015-06-04: qty 10

## 2015-06-04 MED ORDER — SODIUM CHLORIDE 0.9 % IV BOLUS (SEPSIS)
1000.0000 mL | Freq: Once | INTRAVENOUS | Status: AC
  Administered 2015-06-04: 1000 mL via INTRAVENOUS

## 2015-06-04 MED ORDER — HYDRALAZINE HCL 20 MG/ML IJ SOLN
10.0000 mg | Freq: Four times a day (QID) | INTRAMUSCULAR | Status: DC | PRN
  Administered 2015-06-04 – 2015-06-06 (×5): 10 mg via INTRAVENOUS
  Filled 2015-06-04 (×5): qty 1

## 2015-06-04 MED ORDER — CLOPIDOGREL BISULFATE 75 MG PO TABS
75.0000 mg | ORAL_TABLET | Freq: Every day | ORAL | Status: DC
  Administered 2015-06-05 – 2015-06-06 (×2): 75 mg via ORAL
  Filled 2015-06-04 (×2): qty 1

## 2015-06-04 MED ORDER — ACETAMINOPHEN 325 MG PO TABS
650.0000 mg | ORAL_TABLET | Freq: Once | ORAL | Status: AC
Start: 2015-06-04 — End: 2015-06-04
  Administered 2015-06-04: 650 mg via ORAL
  Filled 2015-06-04: qty 2

## 2015-06-04 MED ORDER — SODIUM CHLORIDE 0.9% FLUSH
3.0000 mL | Freq: Two times a day (BID) | INTRAVENOUS | Status: DC
  Administered 2015-06-05 – 2015-06-07 (×4): 3 mL via INTRAVENOUS

## 2015-06-04 MED ORDER — NALOXONE HCL 0.4 MG/ML IJ SOLN
0.4000 mg | Freq: Once | INTRAMUSCULAR | Status: AC
  Administered 2015-06-04: 0.4 mg via INTRAVENOUS
  Filled 2015-06-04: qty 1

## 2015-06-04 MED ORDER — ASPIRIN 81 MG PO CHEW
81.0000 mg | CHEWABLE_TABLET | Freq: Every day | ORAL | Status: DC
  Administered 2015-06-05 – 2015-06-07 (×3): 81 mg via ORAL
  Filled 2015-06-04 (×3): qty 1

## 2015-06-04 NOTE — ED Provider Notes (Signed)
CSN: [id number]     Arrival date & time 06/04/15  1058 History   First MD Initiated Contact with Patient 06/04/15 1101     Chief Complaint  Patient presents with  . Fall     (Consider location/radiation/quality/duration/timing/severity/associated sxs/prior Treatment) HPI Chelsey Gallagher Chelsey Gallagher is a 65 y.o. female with history of hypertension, diabetes, CVA with right-sided paralysis, presents to emergency department after a fall. Patient was sitting in her bed, reports reaching for a tray and falling out of the bed. Patient hit forehead on the ground. Unknown loss of consciousness. Patient had several episodes of emesis after the fall. Patient is on Plavix. Patient apparently also became hypoxic dropping oxygen saturation into the high 80s on room air. Patient is very somnolent to EMS staff. Patient is complaining of pain to the head and left hip. She denies feeling dizzy or lightheaded prior to the fall. She states she normally does not ambulate independently, she can transfer from the bed to the chair with assist. Based on chart review, pt started on robaxin in addition to baclofen for spasms 3 days ago, as well as Requip for RLS also 3 days ago.  Past Medical History  Diagnosis Date  . Diabetes mellitus   . Hypertension   . PAD (peripheral artery disease) ([place])     S/p bypass grafting, unclear exactly where  . Nephrolithiasis   . Hyperlipidemia 05/14/2011  . Hearing loss   . Complication of anesthesia     doesn't wake up good- "usually end up in ICU" also experiences post op nausea and vomiting  . Orthostatic hypotension     after surgery.  Marland Kitchen Heart murmur     PCP- [facility] No tx needed.  . Stroke ([place]) 05/2011    Weakness on Right. Wears leg brace.  . Foot drop, left   . H/O hiatal hernia     second one  . Diabetic neuropathy (HCC)     Diabetic neuropathy- has inproved since she quit smoking.  . Arthritis   . Depression   . Lung abnormality 05/11/2011   Past Surgical  History  Procedure Laterality Date  . Total hip arthroplasty  2008    Right  . Abdominal hysterectomy  2003  . Cesarean section  1984  . Cholecystectomy  1984  . Bypass graft  2003    Abdominal aortic  . Kidney stone surgery  1999  . Appendectomy  2011  . Hernia repair  2011  . Lithotripsy    . Abdominal hysterectomy    . Esophagogastroduodenoscopy N/A 05/09/2013    Procedure: ESOPHAGOGASTRODUODENOSCOPY (EGD);  Surgeon: Lafayette Dragon, MD;  Location: [facility] ENDOSCOPY;  Service: Endoscopy;  Laterality: N/A;  . Colonoscopy N/A 05/10/2013    Procedure: COLONOSCOPY;  Surgeon: Lafayette Dragon, MD;  Location: [facility] ENDOSCOPY;  Service: Endoscopy;  Laterality: N/A;   Family History  Problem Relation Age of Onset  . Cancer Maternal Aunt     breast   Social History  Substance Use Topics  . Smoking status: Former Smoker -- 0.00 packs/day for 45 years    Types: Cigarettes  . Smokeless tobacco: Former Systems developer    Quit date: 05/08/2011  . Alcohol Use: No   OB History    No data available     Review of Systems  Constitutional: Negative for fever and chills.  Respiratory: Negative for cough, chest tightness and shortness of breath.   Cardiovascular: Negative for chest pain, palpitations and leg swelling.  Gastrointestinal: Negative for nausea,  vomiting, abdominal pain and diarrhea.  Genitourinary: Negative for dysuria and flank pain.  Musculoskeletal: Positive for arthralgias. Negative for myalgias, neck pain and neck stiffness.  Skin: Negative for rash.  Neurological: Positive for weakness and headaches. Negative for dizziness.  All other systems reviewed and are negative.     Allergies  Codeine; Duloxetine hcl; Flexeril; and Lyrica  Home Medications   Prior to Admission medications   Medication Sig Start Date End Date Taking? Authorizing Provider  acetaminophen (TYLENOL) 325 MG tablet Take 650 mg by mouth every 6 (six) hours as needed for pain.    Historical Provider, MD  albuterol  (PROVENTIL) (2.5 MG/3ML) 0.083% nebulizer solution Take 3 mLs (2.5 mg total) by nebulization every 4 (four) hours as needed for wheezing or shortness of breath. 02/14/14   Theodis Blaze, MD  amoxicillin-clavulanate (AUGMENTIN) 875-125 MG tablet Take 1 tablet by mouth 2 (two) times daily.    Historical Provider, MD  aspirin 81 MG chewable tablet Chew 81 mg by mouth daily.    Historical Provider, MD  baclofen (LIORESAL) 10 MG tablet Take 5 mg by mouth 2 (two) times daily. 5 mg at 8a and 4pm    Historical Provider, MD  baclofen (LIORESAL) 20 MG tablet Take 20 mg by mouth at bedtime.    Historical Provider, MD  cholecalciferol (VITAMIN D) 1000 UNITS tablet Take 1,000 Units by mouth daily.    Historical Provider, MD  clopidogrel (PLAVIX) 75 MG tablet Take 75 mg by mouth daily.    Historical Provider, MD  DULoxetine (CYMBALTA) 30 MG capsule Take 60 mg by mouth daily. 10/27/12   Lauree Chandler, NP  Fenofibrate 40 MG TABS Take 40 mg by mouth daily.    Historical Provider, MD  ferrous sulfate 325 (65 FE) MG tablet Take 325 mg by mouth.     Historical Provider, MD  gabapentin (NEURONTIN) 300 MG capsule Take 600 mg by mouth at bedtime.     Historical Provider, MD  hydrALAZINE (APRESOLINE) 50 MG tablet Take 50 mg by mouth 3 (three) times daily.    Historical Provider, MD  insulin detemir (LEVEMIR) 100 UNIT/ML injection Inject 12 Units into the skin at bedtime.    Historical Provider, MD  Linaclotide Rolan Lipa) 290 MCG CAPS capsule Take 290 mcg by mouth daily.    Historical Provider, MD  LORazepam (ATIVAN) 0.5 MG tablet Take 1/2 tablet by mouth every morning for anxiety and take one tablet by mouth at bedtime for rest 05/29/15   Estill Dooms, MD  lubiprostone (AMITIZA) 24 MCG capsule Take 24 mcg by mouth 2 (two) times daily with a meal. Reported on 05/29/2015    Historical Provider, MD  magnesium hydroxide (MILK OF MAGNESIA) 800 MG/5ML suspension Take 30 mLs by mouth daily as needed for constipation. 30cc     Historical Provider, MD  Menthol, Topical Analgesic, (BIOFREEZE) 4 % GEL To right ankle and bilateral knees TID 08/27/13   Lauree Chandler, NP  metFORMIN (GLUCOPHAGE) 500 MG tablet Take 1,000 mg by mouth 2 (two) times daily with a meal.     Historical Provider, MD  methocarbamol (ROBAXIN) 500 MG tablet Take 1 tablet (500 mg total) by mouth at bedtime. 08/27/13   Lauree Chandler, NP  nystatin cream (MYCOSTATIN) Apply to groin TID prn rash, reddness, or itching.    Historical Provider, MD  omega-3 acid ethyl esters (LOVAZA) 1 g capsule Take 2 g by mouth 2 (two) times daily.    Historical Provider, MD  polyethylene glycol (MIRALAX / GLYCOLAX) packet Take 17 g by mouth daily as needed for mild constipation.     Historical Provider, MD  pravastatin (PRAVACHOL) 40 MG tablet Take 40 mg by mouth daily.  04/11/11   Historical Provider, MD  promethazine (PHENERGAN) 25 MG tablet Take either by mouth, IM injection, or suppository every 6 hours PRN for nausea or vomiting.    Historical Provider, MD  Propylene Glycol (SYSTANE BALANCE) 0.6 % SOLN Place 1 drop into both eyes 2 (two) times daily.    Historical Provider, MD  rOPINIRole (REQUIP) 0.25 MG tablet Take 0.25 mg by mouth at bedtime.    Historical Provider, MD  Saccharomyces boulardii (FLORASTOR PO) Take by mouth. BID x 2 wks    Historical Provider, MD  sennosides-docusate sodium (SENOKOT-S) 8.6-50 MG tablet Take 1 tablet by mouth daily.    Historical Provider, MD  solifenacin (VESICARE) 5 MG tablet Take 5 mg by mouth daily.    Historical Provider, MD  traMADol (ULTRAM) 50 MG tablet Take two tablets by mouth every 8 hours as needed for pain Patient taking differently: 100 mg. Take one tablet by mouth every 8 hours as needed for pain 06/20/14   Lauree Chandler, NP  vitamin C (ASCORBIC ACID) 500 MG tablet Take 500 mg by mouth daily.    Historical Provider, MD  Wheat Dextrin (BENEFIBER) POWD Take 1 Applicatorful by mouth daily. Mix with 8oz of fluid     Historical Provider, MD   BP 166/63 mmHg  Pulse 74  Resp 13  SpO2 99% Physical Exam  Constitutional: She is oriented to person, place, and time. She appears well-developed and well-nourished.  Somnolent, wakes up to voice command  HENT:  Head: Normocephalic.  Large hematoma left forehead  Eyes: Conjunctivae are normal. Pupils are equal, round, and reactive to light.  Neck: Neck supple.  Cardiovascular: Normal rate, regular rhythm and normal heart sounds.   Pulmonary/Chest: Effort normal and breath sounds normal. No respiratory distress. She has no wheezes. She has no rales.  Abdominal: Soft. Bowel sounds are normal. She exhibits no distension. There is no tenderness. There is no rebound.  Musculoskeletal: She exhibits no edema.  Neurological: She is alert and oriented to person, place, and time.  Speech is slurred. Right sided paralysis  Skin: Skin is warm and dry.  Psychiatric: She has a normal mood and affect. Her behavior is normal.  Nursing note and vitals reviewed.   ED Course  Procedures (including critical care time) Labs Review Labs Reviewed  CBC WITH DIFFERENTIAL/PLATELET - Abnormal; Notable for the following:    Hemoglobin 8.7 (*)    HCT 30.8 (*)    MCV 77.4 (*)    MCH 21.9 (*)    MCHC 28.2 (*)    RDW 18.5 (*)    All other components within normal limits  URINALYSIS, ROUTINE W REFLEX MICROSCOPIC (NOT AT [facility]) - Abnormal; Notable for the following:    APPearance CLOUDY (*)    Protein, ur 30 (*)    Leukocytes, UA LARGE (*)    All other components within normal limits  URINE MICROSCOPIC-ADD ON - Abnormal; Notable for the following:    Squamous Epithelial / LPF 0-5 (*)    Bacteria, UA FEW (*)    All other components within normal limits  I-STAT CHEM 8, ED - Abnormal; Notable for the following:    BUN 38 (*)    Creatinine, Ser 1.90 (*)    Glucose, Bld 106 (*)  Hemoglobin 10.9 (*)    HCT 32.0 (*)    All other components within normal limits  POC OCCULT  BLOOD, ED - Abnormal; Notable for the following:    Fecal Occult Bld POSITIVE (*)    All other components within normal limits  URINE CULTURE  COMPREHENSIVE METABOLIC PANEL  APTT  PROTIME-INR  I-STAT TROPOININ, ED  I-STAT CG4 LACTIC ACID, ED    Imaging Review Dg Chest 1 View  06/04/2015  CLINICAL DATA:  Fall, altered mental status, injury. EXAM: CHEST 1 VIEW COMPARISON:  02/12/2014 FINDINGS: Cardiomegaly with vascular congestion. No confluent opacities, effusions or edema. No acute bony abnormality or pneumothorax. IMPRESSION: Cardiomegaly, vascular congestion Electronically Signed   By: Rolm Baptise M.D.   On: 06/04/2015 12:19   Ct Head Wo Contrast  06/04/2015  CLINICAL DATA:  [place] out of chair, hit forehead on ground. EXAM: CT HEAD WITHOUT CONTRAST CT CERVICAL SPINE WITHOUT CONTRAST TECHNIQUE: Multidetector CT imaging of the head and cervical spine was performed following the standard protocol without intravenous contrast. Multiplanar CT image reconstructions of the cervical spine were also generated. COMPARISON:  MRI brain 02/14/2014 FINDINGS: CT HEAD FINDINGS No acute intracranial abnormality. Specifically, no hemorrhage, hydrocephalus, mass lesion, acute infarction, or significant intracranial injury. No acute calvarial abnormality. Visualized paranasal sinuses and mastoids clear. Orbital soft tissues unremarkable. CT CERVICAL SPINE FINDINGS Normal alignment. No fracture or subluxation. Prevertebral soft tissues are normal. No epidural or paraspinal hematoma. IMPRESSION: No acute intracranial abnormality. No acute bony abnormality in the cervical spine. Electronically Signed   By: Rolm Baptise M.D.   On: 06/04/2015 12:35   Ct Cervical Spine Wo Contrast  06/04/2015  CLINICAL DATA:  [place] out of chair, hit forehead on ground. EXAM: CT HEAD WITHOUT CONTRAST CT CERVICAL SPINE WITHOUT CONTRAST TECHNIQUE: Multidetector CT imaging of the head and cervical spine was performed following the standard  protocol without intravenous contrast. Multiplanar CT image reconstructions of the cervical spine were also generated. COMPARISON:  MRI brain 02/14/2014 FINDINGS: CT HEAD FINDINGS No acute intracranial abnormality. Specifically, no hemorrhage, hydrocephalus, mass lesion, acute infarction, or significant intracranial injury. No acute calvarial abnormality. Visualized paranasal sinuses and mastoids clear. Orbital soft tissues unremarkable. CT CERVICAL SPINE FINDINGS Normal alignment. No fracture or subluxation. Prevertebral soft tissues are normal. No epidural or paraspinal hematoma. IMPRESSION: No acute intracranial abnormality. No acute bony abnormality in the cervical spine. Electronically Signed   By: Rolm Baptise M.D.   On: 06/04/2015 12:35   Dg Knee Complete 4 Views Right  06/04/2015  CLINICAL DATA:  65 year old female with leg spasms after fall in nursing home. Initial encounter. EXAM: RIGHT KNEE - COMPLETE 4+ VIEW COMPARISON:  None. FINDINGS: No fracture or dislocation noted. Bones are osteopenic and if patient has persistent discomfort, follow-up imaging may be considered to exclude subtle injury not detected because of the osteopenia. Tiny suprapatellar joint effusion. Vascular calcifications. IMPRESSION: No fracture or dislocation noted.  Follow-up as discussed above. Electronically Signed   By: Genia Del M.D.   On: 06/04/2015 14:13   Dg Hip Unilat With Pelvis 2-3 Views Left  06/04/2015  CLINICAL DATA:  Fall, injury EXAM: DG HIP (WITH OR WITHOUT PELVIS) 2-3V LEFT COMPARISON:  None. FINDINGS: Prior right hip replacement. Degenerative changes in the left hip. SI joints are symmetric and unremarkable. Diffuse osteopenia. No fracture, subluxation or dislocation. IMPRESSION: No acute bony abnormality.  Degenerative changes in the left hip. Electronically Signed   By: Rolm Baptise M.D.   On: 06/04/2015  12:18   I have personally reviewed and evaluated these images and lab results as part of my medical  decision-making.   EKG Interpretation   Date/Time:  Monday June 04 2015 11:05:41 EDT Ventricular Rate:  72 PR Interval:  165 QRS Duration: 83 QT Interval:  413 QTC Calculation: 452 R Axis:   53 Text Interpretation:  Sinus rhythm Probable left atrial enlargement  Abnormal R-wave progression, early transition LVH with secondary  repolarization abnormality No significant change since last tracing  Confirmed by LITTLE MD, RACHEL [phone]) on 06/04/2015 1:53:40 PM      MDM   Final diagnoses:  Fall    Pt after a fall. Appears sedated. Hypertensive, otherwise normal VS. Pt reports falling after loosing balance while reaching a tray while sitting in bed. She is non ambulatory. She appears very sedated, slurring speech. 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 Will get labs, CT head, ct cervical spine, UA.   1:50 PM Spoke with patient's daughter, patient is much more somnolent than her baseline. Discussed with nursing home, patient apparently had 2 episodes of bloody emesis this morning. Recent hemoglobin is low, patient has an appointment in one month with GI, but sent here due to acute hematemesis. Will check hemoccult. hgb here 8.7  3:20 PM Stools hem positive. Pt's recheck temp is 97.6. She is not tachycardic or hypotensive. Normal respiratory rate. I do not think pt is septic. UA does show infection. Pt's daughter reports recent treatment for UTI. Will send cultures and add rocephin. Spoke with [facility], asked for ABG and lactic acid.   Filed Vitals:   06/04/15 1226 06/04/15 1300 06/04/15 1315 06/04/15 1330  BP: 192/77 193/89 154/73 179/59  Pulse: 77 78 81 76  Temp:      TempSrc:      Resp: 11 14 15 12   SpO2: 97% 100% 100% 96%     Jeannett Senior, PA-C 06/05/15 [place], MD 06/06/15 1025

## 2015-06-04 NOTE — ED Notes (Signed)
Phlebotomy at bedside.

## 2015-06-04 NOTE — ED Notes (Addendum)
Pt arrives via [facility] from [place], ems reports patient fell out of her chair and struck her forehead on the ground. Ems reprots no loc, pt has right sided deficits from previous stroke. EMS reports [facility] stated patient had an episode of vomiting after her fall. Pt is on plavix. Pt alert, oriented to person, place and situation.

## 2015-06-04 NOTE — Significant Event (Signed)
PaO2 64 UC from [facility]-await c/s  Needs SDU 2/2 to TME and Hypoxic resp failure  Verneita Griffes, MD [facility] [phone]

## 2015-06-04 NOTE — ED Notes (Signed)
Patient now c/o right hip pain, PA Kirichenko made aware

## 2015-06-04 NOTE — Progress Notes (Signed)
65 y/o ? 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 Janace Hoard of ENT Chronic constipation on daily linens S Senokot Benefiber and MiraLAX M OM-usually BMs only twice per week according to gastroenterology note by Dr. Henrene Pastor of [facility] 05/2013--note that he had ulcers on EGD on this study done in [place], colonoscopy about 2010 was unremarkable and no bleeding or ulcers at time of EGD in 1986 at time of cholecystectomy. Also note prior hemorrhoidal tags based on note from GI 05/08/13. She also has history of peripheral arterial disease History of abdominal hernia repair Restless leg syndrome Bipolar Hyperlipidemia Hypertension  Based on review of the nursing home notes 05/18/15 she was continued on baclofen 5 twice a day and 20 for nightly spasticity and Robaxin 500 every afternoon at bedtime was added at that time.  Most recently admitted to [facility] 01/2014 with acute respiratory failure secondary to pneumonia/? Aspiration? Pyelonephritis.   History is obtained mainly from review of the emergency room chart as well as from discussion with the daughter Diona Fanti who is at the bedside It appears patient was more somnolent this morning and had 3 episodes of blood tinged vomit without overt blood She usually has slurred speech and the daughter saw the patient about 2 weeks ago but has been calling her and she has noticed that his speech seemed more slurred and the patient wasn't making as much sense Apparently at [facility] skilled nursing care where she came from and she was not eating or drinking as much -Note that the patient was started on Augmentin 875 twice a day on 05/29/15 pending urine culture reports.  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 Is a DO NOT RESUSCITATE   Unclear what her parents medical history was Past surgical history see above No known drug allergies    Emergency room workup revealed somnolent lady not able to give history but waking up slowly not making as much sense as previously  Sodium 142 BUN/creatinine 36/1.8 albumin 3.4 LFTs normal Cath specimen urine showed large leukocytes negative nitrites cloudy urine and fecal occult blood positive  4 view knee x-ray showed no fracture nor dislocation CT head negative for acute infarct CT C-spine negative for any misalignment or hematoma  Chest x-ray = cardiomegaly vascular congestion No degenerative changes to left hip on x-ray  ekg-T wave inversion across precordium leads sinus sinus bradycardia otherwise unchanged from 02/12/2014  Initial MAXIMUM TEMPERATURE 96.4 blood pressure 160s satting 100% on room air  BP 179/59 mmHg  Pulse 76  Temp(Src) 96.5 F (35.8 C) (Axillary)  Resp 12  SpO2 96% [all imaging reviewed]  Caucasian female looking older than stated age, dilated pupils No icterus no pallor Slight drooping of the mouth to the right side Tongue protrudes midline however No JVD no bruit S1-S2 murmur 4/6 across precordium radiating up to the carotids Abdomen soft nontender ventral hernia midline scar No lower extremity edema Dense paresis on the right side with contracture to the right and not able to follow commands otherwise but can track with eyes falls back to sleep when talking to her    Toxic metabolic encephalopathy with prodrome secondary probably to a urinary  infection superimposed on multiple medications and polypharmacy She was recently started on Robaxin and is on baclofen 20, gabapentin 600 daily at bedtime, Requip 0.25 daily at bedtime, Ativan half tablet every morning anxiety and one tablet daily at bedtime,  Phenergan 25 every 6 when necessary nausea/vomiting- We will continue ceftriaxone for empiric treatment of urinary infection she does not meet sepsis criteria and at this time however we will get blood cultures because MAXIMUM TEMPERATURE was 96 on coming to the hospital. We will need to ask the nursing facility to send [place] the report of the urine culture from recently--emergency room nursing secretary aware to get this result for [place] and sent to the floor. I-STAT blood gas pending to determine if patient will need to stepdown monitoring-not D satting off of oxygen-93-92 percentile on room air but falls asleep and snores  History of right and left carotid occlusion as well as left medullary cerebellar infarct 05/2011-will need to continue at least aspirin Plavix right now hold tramadol for contractures   Iron deficiency anemia-monitor. Transfuse for hemoglobin below 7-likely constitutional and secondary to poor by mouth intake  Nausea vomiting-probably related to pyelonephritis-catheterized specimen showed large leukocytes-Rocephin as above Nausea vomiting can be controlled with Zofran IV   Hyperlipidemia-hold Pravachol 40 daily  ? Upper GI bleed-start Protonix 40 IV twice a day for now, would continue aspirin and Plavix at this time. SCDs for DVT prophylaxis  Urinary incontinence-hold Vesicare for now  History of bipolar-hold all medications as patient is somewhat sedate ,, Nothing by mouth except for clear liquids and sips of the same-nursing to inform me if vomiting  Hypertension-hold all meds, hydralazine when necessary 10 mg IV every 6 hourly blood pressure above 160.  DO NOT RESUSCITATE >60 minutes Stepdown status Long discussion with daughter-understands.  Chelsey Gallagher and Chelsey Flattery, MD [facility] [phone]

## 2015-06-04 NOTE — ED Notes (Signed)
Respiratory made aware of need for Arterial Blood Gas

## 2015-06-04 NOTE — ED Notes (Signed)
Pt provided with chicken broth

## 2015-06-04 NOTE — ED Notes (Signed)
Patient placed on bedpan per request.  

## 2015-06-04 NOTE — ED Notes (Signed)
Admitting took patient off of oxygen.  Will place patient back on O2 if saturations drop and inform MD.  Will continue to monitor at this time

## 2015-06-04 NOTE — ED Notes (Signed)
Patient transported to X-ray 

## 2015-06-05 ENCOUNTER — Encounter (HOSPITAL_COMMUNITY): Payer: Self-pay

## 2015-06-05 LAB — COMPREHENSIVE METABOLIC PANEL
ALBUMIN: 3.1 g/dL — AB (ref 3.5–5.0)
ALT: 14 U/L (ref 14–54)
ANION GAP: 12 (ref 5–15)
AST: 19 U/L (ref 15–41)
Alkaline Phosphatase: 51 U/L (ref 38–126)
BILIRUBIN TOTAL: 0.3 mg/dL (ref 0.3–1.2)
BUN: 29 mg/dL — ABNORMAL HIGH (ref 6–20)
CO2: 25 mmol/L (ref 22–32)
Calcium: 9.4 mg/dL (ref 8.9–10.3)
Chloride: 103 mmol/L (ref 101–111)
Creatinine, Ser: 1.62 mg/dL — ABNORMAL HIGH (ref 0.44–1.00)
GFR calc Af Amer: 38 mL/min — ABNORMAL LOW (ref >60)
GFR, EST NON AFRICAN AMERICAN: 33 mL/min — AB (ref >60)
Glucose, Bld: 97 mg/dL (ref 65–99)
POTASSIUM: 4.3 mmol/L (ref 3.5–5.1)
Sodium: 140 mmol/L (ref 135–145)
TOTAL PROTEIN: 6.9 g/dL (ref 6.5–8.1)

## 2015-06-05 LAB — URINE CULTURE: Culture: >=100000

## 2015-06-05 LAB — MRSA PCR SCREENING: MRSA BY PCR: NEGATIVE

## 2015-06-05 LAB — PROTIME-INR
INR: 1.03 (ref 0.00–1.49)
PROTHROMBIN TIME: 13.7 s (ref 11.6–15.2)

## 2015-06-05 MED ORDER — HYDROCODONE-ACETAMINOPHEN 5-325 MG PO TABS
1.0000 | ORAL_TABLET | Freq: Four times a day (QID) | ORAL | Status: DC | PRN
  Administered 2015-06-05 – 2015-06-06 (×3): 1 via ORAL
  Filled 2015-06-05 (×3): qty 1

## 2015-06-05 MED ORDER — DULOXETINE HCL 20 MG PO CPEP
20.0000 mg | ORAL_CAPSULE | Freq: Every day | ORAL | Status: DC
  Administered 2015-06-05 – 2015-06-07 (×3): 20 mg via ORAL
  Filled 2015-06-05 (×5): qty 1

## 2015-06-05 MED ORDER — FLUCONAZOLE IN SODIUM CHLORIDE 200-0.9 MG/100ML-% IV SOLN
200.0000 mg | INTRAVENOUS | Status: DC
  Administered 2015-06-05: 200 mg via INTRAVENOUS
  Filled 2015-06-05 (×2): qty 100

## 2015-06-05 MED ORDER — FERROUS SULFATE 325 (65 FE) MG PO TABS
325.0000 mg | ORAL_TABLET | Freq: Two times a day (BID) | ORAL | Status: DC
  Administered 2015-06-05 – 2015-06-07 (×4): 325 mg via ORAL
  Filled 2015-06-05 (×4): qty 1

## 2015-06-05 MED ORDER — GABAPENTIN 100 MG PO CAPS
100.0000 mg | ORAL_CAPSULE | Freq: Every day | ORAL | Status: DC
  Administered 2015-06-05 – 2015-06-06 (×2): 100 mg via ORAL
  Filled 2015-06-05 (×2): qty 1

## 2015-06-05 MED ORDER — HYDRALAZINE HCL 50 MG PO TABS
50.0000 mg | ORAL_TABLET | Freq: Three times a day (TID) | ORAL | Status: DC
  Administered 2015-06-05 – 2015-06-07 (×6): 50 mg via ORAL
  Filled 2015-06-05 (×6): qty 1

## 2015-06-05 MED ORDER — ACETAMINOPHEN 325 MG PO TABS: 650.0000 mg | ORAL_TABLET | Freq: Four times a day (QID) | ORAL | Status: DC | PRN

## 2015-06-05 MED ORDER — DEXTROSE 5 % IV SOLN
1.0000 g | Freq: Every day | INTRAVENOUS | Status: DC
  Administered 2015-06-05: 1 g via INTRAVENOUS
  Filled 2015-06-05: qty 10

## 2015-06-05 NOTE — Progress Notes (Signed)
Pharmacy Antibiotic Note  Chelsey Gallagher is a 65 y.o. female admitted on 06/04/2015 with UTI.  Pharmacy has been consulted for fluconazole dosing.  Urine cx here shows > 100k of yeast. Pharmacy consulted to start fluconazole. Has been receiving ceftriaxone. She has questionable pyelo. Afebrile, WBC wnl. SCr elevated at 1.62, CrCl ~25ml/min  Plan: Start fluconazole 200mg  IV Q24 Monitor clinical picture, renal function F/U C&S, abx deescalation / LOT   Height: 5\' 3"  (160 cm) Weight: 143 lb 11.8 oz (65.2 kg) IBW/kg (Calculated) : 52.4  Temp (24hrs), Avg:97.9 F (36.6 C), Min:97.3 F (36.3 C), Max:98.2 F (36.8 C)   Recent Labs Lab 06/04/15 1151 06/04/15 1157 06/04/15 1431 06/04/15 1552 06/04/15 1849 06/05/15 0510  WBC 7.3  --   --   --   --  7.0  CREATININE  --  1.90* 1.83*  --   --  1.62*  LATICACIDVEN  --   --   --  1.11 1.08  --     Estimated Creatinine Clearance: 32.3 mL/min (by C-G formula based on Cr of 1.62).    Allergies  Allergen Reactions  . Codeine Other (See Comments)    Per Mar  . Duloxetine Hcl   . Flexeril [Cyclobenzaprine Hcl] Other (See Comments)    Per Mar  . Lyrica [Pregabalin] Other (See Comments)    "wires my up"    Antimicrobials this admission: Fluconazole 4/4 >>   Dose adjustments this admission: n/a  Microbiology results: 4/3 UCx: > 100k of yeast   Thank you for allowing pharmacy to be a part of this patient's care.  Reginia Naas 06/05/2015 8:19 PM

## 2015-06-05 NOTE — H&P (Addendum)
Chelsey Gallagher [id number] DOB: 1950-09-28 DOA: 06/04/2015 PCP: [name], DO  Brief narrative:  65 y/o ? 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 Janace Hoard of ENT Chronic constipation on Linzess, Senokot Benefiber and MiraLAX M OM-usually BMs only twice per week according to gastroenterology note by Dr. Henrene Pastor of [facility] 05/2013--note that he had ulcers on EGD on this study done in [place], colonoscopy about 2010 was unremarkable and no bleeding or ulcers at time of EGD in 1986 at time of cholecystectomy. Also note prior hemorrhoidal tags based on note from GI 05/08/13. She also has history of peripheral arterial disease History of abdominal hernia repair Restless leg syndrome Bipolar Hyperlipidemia Hypertension  Based on review of the nursing home notes 05/18/15 she was continued on baclofen 5 twice a day and 20 for nightly spasticity and Robaxin 500 every afternoon at bedtime was added at that time.  Most recently admitted to [facility] 01/2014 with acute respiratory failure secondary to pneumonia/? Aspiration? Pyelonephritis.  Apparently at [facility] where she came from and she was not eating or drinking as much -Note that the patient was started on Augmentin 875 twice a day on 05/29/15 -Enterococcus?  Past medical history-As per Problem list Chart reviewed as below-    Subjective  Much more alert oriented in nad No cp no n/v/sob   Objective    Interim History:   Telemetry: nsr   Objective: Filed Vitals:   06/05/15 0000 06/05/15 0100 06/05/15 0400 06/05/15 0700  BP: 147/64 121/88 95/45 142/60  Pulse: 84     Temp:  97.9 F (36.6 C) 97.3 F (36.3 C) 98.2 F (36.8 C)  TempSrc:  Axillary Axillary Oral  Resp: 18 20 16 20     Height:  5\' 3"  (1.6 m)    Weight:  65.2 kg (143 lb 11.8 oz)    SpO2: 99% 100% 99% 99%    Intake/Output Summary (Last 24 hours) at 06/05/15 1043 Last data filed at 06/05/15 0600  Gross per 24 hour  Intake    525 ml  Output      0 ml  Net    525 ml    Exam:  Caucasian female looking older than stated age, more awake alert Slight drooping of the mouth to the right side Tongue protrudes midline however No JVD no bruit S1-S2 murmur 4/6 across precordium radiating up to the carotids Abdomen soft nontender ventral hernia midline scar No lower extremity edema Dense paresis on the right side with contracture to the right and not able to follow commands otherwise but can track with eyes falls back to sleep when talking to her  Data Reviewed: Basic Metabolic Panel:  Recent Labs Lab 06/04/15 1157 06/04/15 1431 06/05/15 0510  NA 142 142 140  K 4.9 4.5 4.3  CL 104 107 103  CO2  --  26 25  GLUCOSE 106* 105* 97  BUN 38* 36* 29*  CREATININE 1.90* 1.83* 1.62*  CALCIUM  --  9.8 9.4   Liver Function Tests:  Recent Labs Lab 06/04/15 1431 06/05/15 0510  AST 18 19  ALT 14 14  ALKPHOS 55 51  BILITOT 0.4 0.3  PROT 7.3 6.9  ALBUMIN 3.4* 3.1*   No results for input(s): LIPASE, AMYLASE in the last 168 hours. No results for input(s): AMMONIA  in the last 168 hours. CBC:  Recent Labs Lab 06/04/15 1151 06/04/15 1157 06/05/15 0510  WBC 7.3  --  7.0  NEUTROABS 5.5  --   --   HGB 8.7* 10.9* 7.6*  HCT 30.8* 32.0* 25.1*  MCV 77.4*  --  77.7*  PLT 307  --  286   Cardiac Enzymes: No results for input(s): CKTOTAL, CKMB, CKMBINDEX, TROPONINI in the last 168 hours. BNP: Invalid input(s): POCBNP CBG: No results for input(s): GLUCAP in the last 168 hours.  Recent Results (from the past 240 hour(s))  MRSA PCR Screening     Status: None   Collection Time: 06/05/15  1:07 AM  Result Value Ref Range Status   MRSA by PCR NEGATIVE NEGATIVE Final    Comment:        The GeneXpert MRSA  Assay (FDA approved for NASAL specimens only), is one component of a comprehensive MRSA colonization surveillance program. It is not intended to diagnose MRSA infection nor to guide or monitor treatment for MRSA infections.      Studies:              All Imaging reviewed and is as per above notation   Scheduled Meds: . aspirin  81 mg Oral Daily  . cefTRIAXone (ROCEPHIN)  IV  1 g Intravenous Daily  . clopidogrel  75 mg Oral Daily  . insulin detemir  6 Units Subcutaneous QHS  . sodium chloride flush  3 mL Intravenous Q12H   Continuous Infusions: . sodium chloride 75 mL/hr at 06/04/15 1923     Assessment/Plan:  Toxic metabolic encephalopathy with prodrome secondary probably to a urinary infection superimposed on multiple medications and polypharmacy [ recently started on Robaxin and is on baclofen 20, gabapentin 600 daily at bedtime, Requip 0.25 daily at bedtime, Ativan half tablet every morning anxiety and one tablet daily at bedtime, Phenergan 25 every 6 when necessary nausea/vomiting] We will continue ceftriaxone for empiric treatment of urinary infection-?Enterococcal she does not meet sepsis criteria and at this time however we will get blood cultures because MAXIMUM TEMPERATURE was 96 on coming to the hospital. I-STAT blood gas on admit showed Hypoxic resp failure-acute  AKI-Ser cr trending down now Saline locked IVF 06/05/15  ?Pyelo-Await Urine cult reports from 3/28 from [facility] here shows yeast >100,000 CFU-d/c rocephin-start IV fluconazole 8:16 PM  History of right and left carotid occlusion as well as left medullary cerebellar infarct 05/2011-will need to continue at least aspirin Plavix right now  hold tramadol for contractures   Iron deficiency anemia-monitor. Transfuse for hemoglobin below 7-likely constitutional and secondary to poor by mouth intake  Nausea vomiting-probably related to pyelonephritis-catheterized specimen showed large  leukocytes-Rocephin as above. Nausea vomiting can be controlled with Zofran IV  Hyperlipidemia-hold Pravachol 40 daily  ? Upper GI bleed-start Protonix 40 IV twice a day for now, would continue aspirin and Plavix at this time. SCDs for DVT prophylaxis Hemoccult x2  pending  DM-Hold Metformin--would NOT use if AKI/CKD stg moving forward  Urinary incontinence-resume Vesicare 06/05/15  History of bipolar-hold all medications as patient is somewhat sedate -needs gradual re-introduction all the while minimizing Polypharmacy  Hypertension-hold all meds, hydralazine when necessary 10 mg IV every 6 hourly blood pressure above 160.  DO NOT RESUSCITATE Stepdown status Grad to full liquids if no n/v and no blood could potenttially d/c to SNF am 06/06/15  No family bedside today  Verneita Griffes, MD  [facility] Pager [phone] 06/05/2015, 10:43 AM    LOS:  1 day

## 2015-06-06 ENCOUNTER — Encounter (HOSPITAL_COMMUNITY): Payer: Self-pay | Admitting: General Practice

## 2015-06-06 DIAGNOSIS — D500 Iron deficiency anemia secondary to blood loss (chronic): Secondary | ICD-10-CM | POA: Insufficient documentation

## 2015-06-06 DIAGNOSIS — N179 Acute kidney failure, unspecified: Secondary | ICD-10-CM

## 2015-06-06 DIAGNOSIS — D509 Iron deficiency anemia, unspecified: Secondary | ICD-10-CM

## 2015-06-06 DIAGNOSIS — G92 Toxic encephalopathy: Principal | ICD-10-CM

## 2015-06-06 DIAGNOSIS — E785 Hyperlipidemia, unspecified: Secondary | ICD-10-CM

## 2015-06-06 DIAGNOSIS — R195 Other fecal abnormalities: Secondary | ICD-10-CM | POA: Insufficient documentation

## 2015-06-06 LAB — COMPREHENSIVE METABOLIC PANEL
ALT: 13 U/L — ABNORMAL LOW (ref 14–54)
ANION GAP: 10 (ref 5–15)
AST: 20 U/L (ref 15–41)
Albumin: 2.9 g/dL — ABNORMAL LOW (ref 3.5–5.0)
Alkaline Phosphatase: 50 U/L (ref 38–126)
BUN: 23 mg/dL — ABNORMAL HIGH (ref 6–20)
CHLORIDE: 103 mmol/L (ref 101–111)
CO2: 25 mmol/L (ref 22–32)
CREATININE: 1.51 mg/dL — AB (ref 0.44–1.00)
Calcium: 9.5 mg/dL (ref 8.9–10.3)
GFR, EST AFRICAN AMERICAN: 41 mL/min — AB (ref >60)
GFR, EST NON AFRICAN AMERICAN: 36 mL/min — AB (ref >60)
Glucose, Bld: 114 mg/dL — ABNORMAL HIGH (ref 65–99)
POTASSIUM: 4.3 mmol/L (ref 3.5–5.1)
SODIUM: 138 mmol/L (ref 135–145)
Total Bilirubin: 0.4 mg/dL (ref 0.3–1.2)
Total Protein: 6.6 g/dL (ref 6.5–8.1)

## 2015-06-06 LAB — RETICULOCYTES
RBC.: 3.35 MIL/uL — AB (ref 3.87–5.11)
Retic Count, Absolute: 70.4 10*3/uL (ref 19.0–186.0)
Retic Ct Pct: 2.1 % (ref 0.4–3.1)

## 2015-06-06 LAB — GLUCOSE, CAPILLARY
Glucose-Capillary: 154 mg/dL — ABNORMAL HIGH (ref 65–99)
Glucose-Capillary: 124 mg/dL — ABNORMAL HIGH (ref 65–99)
Glucose-Capillary: 102 mg/dL — ABNORMAL HIGH (ref 65–99)

## 2015-06-06 LAB — CBC
HCT: 25.1 % — ABNORMAL LOW (ref 36.0–46.0)
HCT: 23.9 % — ABNORMAL LOW (ref 36.0–46.0)
HEMOGLOBIN: 7.6 g/dL — AB (ref 12.0–15.0)
Hemoglobin: 7.3 g/dL — ABNORMAL LOW (ref 12.0–15.0)
MCH: 23.5 pg — ABNORMAL LOW (ref 26.0–34.0)
MCH: 23.4 pg — AB (ref 26.0–34.0)
MCHC: 30.3 g/dL (ref 30.0–36.0)
MCHC: 30.5 g/dL (ref 30.0–36.0)
MCV: 77.7 fL — AB (ref 78.0–100.0)
MCV: 76.6 fL — AB (ref 78.0–100.0)
PLATELETS: 286 10*3/uL (ref 150–400)
PLATELETS: 289 10*3/uL (ref 150–400)
RBC: 3.23 MIL/uL — AB (ref 3.87–5.11)
RBC: 3.12 MIL/uL — ABNORMAL LOW (ref 3.87–5.11)
RDW: 18.7 % — ABNORMAL HIGH (ref 11.5–15.5)
RDW: 19 % — AB (ref 11.5–15.5)
WBC: 7 10*3/uL (ref 4.0–10.5)
WBC: 10.6 10*3/uL — ABNORMAL HIGH (ref 4.0–10.5)

## 2015-06-06 LAB — IRON AND TIBC
IRON: 20 ug/dL — AB (ref 28–170)
SATURATION RATIOS: 5 % — AB (ref 10.4–31.8)
TIBC: 428 ug/dL (ref 250–450)
UIBC: 408 ug/dL

## 2015-06-06 LAB — PROTIME-INR
INR: 1.13 (ref 0.00–1.49)
PROTHROMBIN TIME: 14.7 s (ref 11.6–15.2)

## 2015-06-06 LAB — PREPARE RBC (CROSSMATCH)

## 2015-06-06 LAB — VITAMIN B12: Vitamin B-12: 155 pg/mL — ABNORMAL LOW (ref 180–914)

## 2015-06-06 LAB — FOLATE: FOLATE: 33.4 ng/mL (ref >5.9)

## 2015-06-06 LAB — ABO/RH: ABO/RH(D): B POS

## 2015-06-06 LAB — FERRITIN: Ferritin: 26 ng/mL (ref 11–307)

## 2015-06-06 MED ORDER — SODIUM CHLORIDE 0.9 % IV SOLN
Freq: Once | INTRAVENOUS | Status: AC
  Administered 2015-06-06: 13:00:00 via INTRAVENOUS

## 2015-06-06 MED ORDER — AMLODIPINE BESYLATE 5 MG PO TABS
5.0000 mg | ORAL_TABLET | Freq: Every day | ORAL | Status: DC
  Administered 2015-06-06 – 2015-06-07 (×2): 5 mg via ORAL
  Filled 2015-06-06 (×2): qty 1

## 2015-06-06 MED ORDER — ACETAMINOPHEN 325 MG PO TABS
650.0000 mg | ORAL_TABLET | Freq: Once | ORAL | Status: AC
Start: 2015-06-06 — End: 2015-06-06
  Administered 2015-06-06: 650 mg via ORAL
  Filled 2015-06-06: qty 2

## 2015-06-06 MED ORDER — PRAVASTATIN SODIUM 40 MG PO TABS
40.0000 mg | ORAL_TABLET | Freq: Every day | ORAL | Status: DC
  Administered 2015-06-06: 40 mg via ORAL
  Filled 2015-06-06: qty 1

## 2015-06-06 MED ORDER — FENOFIBRATE 54 MG PO TABS
54.0000 mg | ORAL_TABLET | Freq: Every day | ORAL | Status: DC
  Administered 2015-06-06 – 2015-06-07 (×2): 54 mg via ORAL
  Filled 2015-06-06 (×2): qty 1

## 2015-06-06 MED ORDER — INSULIN ASPART 100 UNIT/ML ~~LOC~~ SOLN: 0.0000 [IU] | Freq: Three times a day (TID) | SUBCUTANEOUS | Status: DC

## 2015-06-06 MED ORDER — DIPHENHYDRAMINE HCL 50 MG/ML IJ SOLN
25.0000 mg | Freq: Once | INTRAMUSCULAR | Status: AC
  Administered 2015-06-06: 25 mg via INTRAVENOUS
  Filled 2015-06-06: qty 1

## 2015-06-06 NOTE — Consult Note (Signed)
Consultation  Referring Provider: [facility] Primary Care Physician:  [name], DO Primary Gastroenterologist:  Dr.Brodie former  Reason for Consultation:  Iron deficiency anemia/heme positive stool  HPI: Chelsey Gallagher is a 65 y.o. female  admitted on 06/04/2015 after loss of consciousness. Patient resides in a nursing home. She is felt to have had a toxic metabolic encephalopathy secondary to UTI/pyelonephritis. Patient has history of CVA in 2013 with residual right hemiplegia and is status post right carotid stenting in July 2013. She is maintained on aspirin and Plavix. Also with history of adult-onset diabetes mellitus, generalized anxiety disorder, bipolar, and with history of iron deficiency anemia. She had undergone workup in March 2015 while hospitalized with heme positive stool and anemia. She had colonoscopy per Dr. Delfin Edis which was a normal exam and also had EGD which was normal but noted finding of a Schatzki's ring. Ongoing iron supplementation was recommended. Patient noted to be heme positive since admission here and there was question of hematemesis prior to admission. She has been on ferrous sulfate 325 mg by mouth daily as an outpatient. Review of labs shows hemoglobin in September 2000 1610.7, hemoglobin on 05/28/2015 7.6, on 06/04/2015 hemoglobin 10.9 and on 06/05/2015 hemoglobin 7.6 hematocrit 25.5 MCV of 77. Serum iron 20 TIBC 428 iron saturation of 5 and ferritin of 26.  Pt has no c/o abdominal discomfort, no recollection of nausea/vomiting or any hematemesis. Denies melena or hematochezia. Mentating normally.   Past Medical History  Diagnosis Date  . Diabetes mellitus   . Hypertension   . PAD (peripheral artery disease) ([place])     S/p bypass grafting, unclear exactly where  . Nephrolithiasis   . Hyperlipidemia 05/14/2011  . Hearing loss   . Complication of anesthesia     doesn't wake up good- "usually end up in ICU" also experiences post op  nausea and vomiting  . Orthostatic hypotension     after surgery.  Marland Kitchen Heart murmur     [facility]P- [facility]- No tx needed.  . Stroke ([place]) 05/2011    Weakness on Right. Wears leg brace.  . Foot drop, left   . H/O hiatal hernia     second one  . Diabetic neuropathy ([facility])     Diabetic neuropathy- has inproved since she quit smoking.  . Arthritis   . Depression   . Lung abnormality 05/11/2011    Past Surgical History  Procedure Laterality Date  . Total hip arthroplasty  2008    Right  . Abdominal hysterectomy  2003  . Cesarean section  1984  . Cholecystectomy  1984  . Bypass graft  2003    Abdominal aortic  . Kidney stone surgery  1999  . Appendectomy  2011  . Hernia repair  2011  . Lithotripsy    . Abdominal hysterectomy    . Esophagogastroduodenoscopy N/A 05/09/2013    Procedure: ESOPHAGOGASTRODUODENOSCOPY (EGD);  Surgeon: Lafayette Dragon, MD;  Location[facility] ENDOSCOPY;  Service: Endoscopy;  Laterality: N/A;  . Colonoscopy N/A 05/10/2013    Procedure: COLONOSCOPY;  Surgeon: Lafayette Dragon, MD;  Location: [facility] ENDOSCOPY;  Service: Endoscopy;  Laterality: N/A;    Prior to Admission medications   Medication Sig Start Date End Date Taking? Authorizing Provider  acetaminophen (TYLENOL) 325 MG tablet Take 650 mg by mouth every 6 (six) hours as needed for pain.   Yes Historical Provider, MD  albuterol (PROVENTIL) (2.5 MG/3ML) 0.083% nebulizer solution Take 3 mLs (2.5 mg total) by nebulization  every 4 (four) hours as needed for wheezing or shortness of breath. 02/14/14  Yes Theodis Blaze, MD  amoxicillin-clavulanate (AUGMENTIN) 875-125 MG tablet Take 1 tablet by mouth 2 (two) times daily. 05/31/15 06/06/15 Yes Historical Provider, MD  aspirin 81 MG chewable tablet Chew 81 mg by mouth daily.   Yes Historical Provider, MD  baclofen (LIORESAL) 10 MG tablet Take 5 mg by mouth 2 (two) times daily. 5 mg at 10a and 6pm   Yes Historical Provider, MD  baclofen (LIORESAL) 20 MG tablet Take 20 mg by  mouth at bedtime.   Yes Historical Provider, MD  cholecalciferol (VITAMIN D) 1000 UNITS tablet Take 1,000 Units by mouth daily.   Yes Historical Provider, MD  clopidogrel (PLAVIX) 75 MG tablet Take 75 mg by mouth daily.   Yes Historical Provider, MD  DULoxetine (CYMBALTA) 60 MG capsule Take 60 mg by mouth daily. 05/23/15  Yes Historical Provider, MD  fenofibrate (TRICOR) 48 MG tablet Take 48 mg by mouth daily. 05/20/15  Yes Historical Provider, MD  ferrous sulfate 325 (65 FE) MG tablet Take 325 mg by mouth 2 (two) times daily with a meal.    Yes Historical Provider, MD  gabapentin (NEURONTIN) 300 MG capsule Take 600 mg by mouth at bedtime.    Yes Historical Provider, MD  hydrALAZINE (APRESOLINE) 50 MG tablet Take 50 mg by mouth 3 (three) times daily.   Yes Historical Provider, MD  insulin detemir (LEVEMIR) 100 UNIT/ML injection Inject 12 Units into the skin at bedtime.   Yes Historical Provider, MD  Linaclotide (LINZESS) 290 MCG CAPS capsule Take 290 mcg by mouth daily.   Yes Historical Provider, MD  LORazepam (ATIVAN) 0.5 MG tablet Take 1/2 tablet by mouth every morning for anxiety and take one tablet by mouth at bedtime for rest 05/29/15  Yes Estill Dooms, MD  magnesium hydroxide (MILK OF MAGNESIA) 800 MG/5ML suspension Take 30 mLs by mouth daily as needed for constipation. 26cc   Yes Historical Provider, MD  Menthol, Topical Analgesic, (BIOFREEZE) 4 % GEL To right ankle and bilateral knees TID 08/27/13  Yes Lauree Chandler, NP  metFORMIN (GLUCOPHAGE) 500 MG tablet Take 1,000 mg by mouth 2 (two) times daily with a meal.    Yes Historical Provider, MD  methocarbamol (ROBAXIN) 500 MG tablet Take 1 tablet (500 mg total) by mouth at bedtime. 08/27/13  Yes Lauree Chandler, NP  nystatin cream (MYCOSTATIN) Apply 1 application topically 3 (three) times daily as needed (rash, redness, or itching).    Yes Historical Provider, MD  omega-3 acid ethyl esters (LOVAZA) 1 g capsule Take 2 g by mouth 2 (two) times  daily.   Yes Historical Provider, MD  pravastatin (PRAVACHOL) 40 MG tablet Take 40 mg by mouth daily.  04/11/11  Yes Historical Provider, MD  promethazine (PHENERGAN) 25 MG tablet Take either by mouth, IM injection, or suppository every 6 hours PRN for nausea or vomiting.   Yes Historical Provider, MD  Propylene Glycol (SYSTANE BALANCE) 0.6 % SOLN Place 1 drop into both eyes 2 (two) times daily.   Yes Historical Provider, MD  rOPINIRole (REQUIP) 0.25 MG tablet Take 0.25 mg by mouth at bedtime.   Yes Historical Provider, MD  Saccharomyces boulardii (FLORASTOR PO) Take 1 capsule by mouth 2 (two) times daily. 2 wks 05/30/15 06/13/15 Yes Historical Provider, MD  sennosides-docusate sodium (SENOKOT-S) 8.6-50 MG tablet Take 1 tablet by mouth daily.   Yes Historical Provider, MD  solifenacin (VESICARE) 5 MG  tablet Take 5 mg by mouth daily.   Yes Historical Provider, MD  traMADol (ULTRAM) 50 MG tablet Take two tablets by mouth every 8 hours as needed for pain Patient taking differently: Take 50 mg by mouth every 8 (eight) hours as needed for moderate pain.  06/20/14  Yes Lauree Chandler, NP  vitamin C (ASCORBIC ACID) 500 MG tablet Take 500 mg by mouth 2 (two) times daily.    Yes Historical Provider, MD  Wheat Dextrin (BENEFIBER) POWD Take 1 scoop by mouth daily. Mix with 8oz of fluid   Yes Historical Provider, MD    Current Facility-Administered Medications  Medication Dose Route Frequency Provider Last Rate Last Dose  . acetaminophen (TYLENOL) tablet 650 mg  650 mg Oral Q6H PRN Gardiner Barefoot, NP      . aspirin chewable tablet 81 mg  81 mg Oral Daily Nita Sells, MD   81 mg at 06/06/15 1033  . DULoxetine (CYMBALTA) DR capsule 20 mg  20 mg Oral Daily Nita Sells, MD   20 mg at 06/06/15 1032  . fenofibrate tablet 54 mg  54 mg Oral Daily Jonetta Osgood, MD   54 mg at 06/06/15 1032  . ferrous sulfate tablet 325 mg  325 mg Oral BID WC Nita Sells, MD   325 mg at 06/06/15 1032    . gabapentin (NEURONTIN) capsule 100 mg  100 mg Oral QHS Nita Sells, MD   100 mg at 06/05/15 2113  . hydrALAZINE (APRESOLINE) injection 10 mg  10 mg Intravenous Q6H PRN Nita Sells, MD   10 mg at 06/06/15 1317  . hydrALAZINE (APRESOLINE) tablet 50 mg  50 mg Oral TID Nita Sells, MD   50 mg at 06/06/15 1033  . HYDROcodone-acetaminophen (NORCO/VICODIN) 5-325 MG per tablet 1 tablet  1 tablet Oral Q6H PRN Gardiner Barefoot, NP   1 tablet at 06/05/15 1635  . insulin aspart (novoLOG) injection 0-9 Units  0-9 Units Subcutaneous TID WC Jonetta Osgood, MD   0 Units at 06/06/15 1200  . insulin detemir (LEVEMIR) injection 6 Units  6 Units Subcutaneous QHS Nita Sells, MD   6 Units at 06/05/15 2114  . pravastatin (PRAVACHOL) tablet 40 mg  40 mg Oral q1800 Jonetta Osgood, MD      . sodium chloride flush (NS) 0.9 % injection 3 mL  3 mL Intravenous Q12H Nita Sells, MD   3 mL at 06/05/15 2114    Allergies as of 06/04/2015 - Review Complete 06/04/2015  Allergen Reaction Noted  . Codeine Other (See Comments) 05/08/2011  . Duloxetine hcl  10/20/2014  . Flexeril [cyclobenzaprine hcl] Other (See Comments) 05/08/2011  . Lyrica [pregabalin] Other (See Comments) 08/29/2011    Family History  Problem Relation Age of Onset  . Cancer Maternal Aunt     breast    Social History   Social History  . Marital Status: Divorced    Spouse Name: N/A  . Number of Children: N/A  . Years of Education: N/A   Occupational History  . Not on file.   Social History Main Topics  . Smoking status: Former Smoker -- 0.00 packs/day for 45 years    Types: Cigarettes  . Smokeless tobacco: Former Systems developer    Quit date: 05/08/2011  . Alcohol Use: No  . Drug Use: No  . Sexual Activity: No   Other Topics Concern  . Not on file   Social History Narrative   Lives by herself but still close to  family. Not very active, does housework.     Review of Systems: Review of Systems   All other systems reviewed and are negative.  Marland Kitchen  Physical Exam: Vital signs in last 24 hours: Temp:  [96.8 F (36 C)-98.8 F (37.1 C)] 96.8 F (36 C) (04/05 1343) Pulse Rate:  [94-96] 96 (04/05 1343) Resp:  [18-31] 21 (04/05 1343) BP: (139-190)/(51-91) 187/91 mmHg (04/05 1305) SpO2:  [89 %-95 %] 92 % (04/05 1343) Last BM Date: 06/04/15 General:   Alert,  Well-developed, elderly WF , pleasant and cooperative in NAD Head:  Normocephalic and atraumatic. Eyes:  Sclera clear, no icterus.   Conjunctiva pale . Ears:  Normal auditory acuity. Nose:  No deformity, discharge,  or lesions. Mouth:  No deformity or lesions.   Neck:  Supple; no masses or thyromegaly. Lungs:  Clear throughout to auscultation.   No wheezes, crackles, or rhonchi. Heart:  Regular rate and rhythm; + systolic murmur. Abdomen:  Soft,nontender, BS active,nonpalp mass or hsm, midline scar and large ventral hernia  Rectal:  Deferred -documented heme + Msk:  Symmetrical without gross deformities. . Pulses:  Normal pulses noted. Extremities:  Without clubbing or edema. Neurologic:  Alert and  oriented x4;  Right hemiplegia Skin:  Intact without significant lesions or rashes.. Psych:  Alert and cooperative. Normal mood and affect.  Intake/Output from previous day: 04/04 0701 - 04/05 0700 In: 980 [P.O.:980] Out: -  Intake/Output this shift: Total I/O In: 360 [P.O.:360] Out: -   Lab Results:  Recent Labs  06/04/15 1151 06/04/15 1157 06/05/15 0510 06/06/15 0615  WBC 7.3  --  7.0 10.6*  HGB 8.7* 10.9* 7.6* 7.3*  HCT 30.8* 32.0* 25.1* 23.9*  PLT 307  --  286 289   BMET  Recent Labs  06/04/15 1431 06/05/15 0510 06/06/15 0615  NA 142 140 138  K 4.5 4.3 4.3  CL 107 103 103  CO2 26 25 25   GLUCOSE 105* 97 114*  BUN 36* 29* 23*  CREATININE 1.83* 1.62* 1.51*  CALCIUM 9.8 9.4 9.5   LFT  Recent Labs  06/06/15 0615  PROT 6.6  ALBUMIN 2.9*  AST 20  ALT 13*  ALKPHOS 50  BILITOT 0.4    PT/INR  Recent Labs  06/05/15 0510 06/06/15 0615  LABPROT 13.7 14.7  INR 1.03 1.13       IMPRESSION:   #1 65 yo female admitted with TME secondary to UTI/pyelonephritis-improving #2 chronic iron deficiency anemia dating back at least a couple years and heme+ while on ASA and plavix- negative EGD and COLON 2 years ago for  Same . 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

## 2015-06-06 NOTE — Progress Notes (Signed)
Report given to RN on 5W. Daughter notified of transfer.   M.Shetara Launer, RN

## 2015-06-06 NOTE — Progress Notes (Signed)
PATIENT DETAILS Name: Chelsey Gallagher Age: 65 y.o. Sex: female Date of Birth: [birth year] Admit Date: 06/04/2015 Admitting Physician Nita Sells, MD [id number], Brayton Layman, DO  Brief narrative: 65 year old female with history of CVA with residual right-sided hemiplegia-history of right carotid stenosis requiring stenting in 2013 on aspirin and Plavix chronically, long-standing history of iron deficiency anemia presumed to be secondary to chronic GI bleeding presented to the hospital with acute encephalopathy, and vomiting with some hematemesis. Encephalopathy was felt to be secondary to polypharmacy, and now has resolved with supportive care. Main issues currently are improving acute renal failure and worsening microcytic anemia secondary to probable chronic GI bleeding.  Subjective: Awake and alert.  Assessment/Plan: Active Problems: Acute Toxic metabolic encephalopathy: Resolved, completely awake and alert. Multifactorial etiology, secondary to polypharmacy and probable UTI. CT head negative on admission. Sinus to minimize polypharmacy as much as possible, not sure if urine culture of yeast is a true infection, hence will stop fluconazole and monitor off all antimicrobial agents.  ARF: Likely prerenal azotemia, downtrending with gentle hydration.  Microcytic anemia likely secondary to slow/chronic GI bleeding: Has a long-standing history of FOBT stools, on iron supplementation as outpatient. Previous EGD/colonoscopy 2015 negative. Hemoglobin is slowly drifting down, will transfuse 1 unit of PRBC and have consulted gastroenterology to see if patient needs repeat endoscopic procedures while inpatient. Continue PPI. See below regarding dual antiplatelet agents.  History of CVA with residual right-sided hemiplegia: Currently on both aspirin and Plavix along with statin. Given chronic GI bleeding-spoke with Dr. Carilyn Goodpasture M.D. on call-he suggested that we could discontinue  Plavix and just place on aspirin.  History of right carotid artery stenosis-status post right ICA stent 2013: Continues to be on both aspirin and Plavix, after discussion with Dr. Carilyn Goodpasture M.D.-he advised to continue aspirin and stop Plavix.   Dyslipidemia: Continue Pravachol.  Type 2 diabetes: CBGs stable, continue Levemir-add SSI. Resume metformin on discharge if creatinine continues to down trend.  Urinary incontinence: Continue Vesicare.  History of bipolar disorder: Currently stable, continue Cymbalta  Disposition: Remain inpatient-transferred to MedSurg. Suspect SNF on 4/6  Antimicrobial agents  See below  Anti-infectives    Start     Dose/Rate Route Frequency Ordered Stop   06/05/15 2100  fluconazole (DIFLUCAN) IVPB 200 mg     200 mg 100 mL/hr over 60 Minutes Intravenous Every 24 hours 06/05/15 2019     06/05/15 1000  cefTRIAXone (ROCEPHIN) 1 g in dextrose 5 % 50 mL IVPB  Status:  Discontinued     1 g 100 mL/hr over 30 Minutes Intravenous Daily 06/05/15 0115 06/05/15 2015   06/04/15 1530  cefTRIAXone (ROCEPHIN) 1 g in dextrose 5 % 50 mL IVPB     1 g 100 mL/hr over 30 Minutes Intravenous  Once 06/04/15 1519 06/04/15 1700      DVT Prophylaxis: SCD's  Code Status: DNR  Family Communication None at bedside  Procedures: None  CONSULTS:  GI  Time spent 30 minutes-Greater than 50% of this time was spent in counseling, explanation of diagnosis, planning of further management, and coordination of care.  MEDICATIONS: Scheduled Meds: . aspirin  81 mg Oral Daily  . clopidogrel  75 mg Oral Daily  . DULoxetine  20 mg Oral Daily  . fenofibrate  54 mg Oral Daily  . ferrous sulfate  325 mg Oral BID WC  . fluconazole (DIFLUCAN) IV  200 mg Intravenous Q24H  .  gabapentin  100 mg Oral QHS  . hydrALAZINE  50 mg Oral TID  . insulin detemir  6 Units Subcutaneous QHS  . pravastatin  40 mg Oral q1800  . sodium chloride flush  3 mL Intravenous Q12H   Continuous  Infusions:  PRN Meds:.acetaminophen, hydrALAZINE, HYDROcodone-acetaminophen    PHYSICAL EXAM: Vital signs in last 24 hours: Filed Vitals:   06/06/15 0300 06/06/15 0400 06/06/15 0650 06/06/15 0749  BP: 142/51 139/60    Pulse:      Temp:  98 F (36.7 C)  98.7 F (37.1 C)  TempSrc:  Oral  Oral  Resp: 22 18 24    Height:      Weight:      SpO2: 94% 95% 92%     Weight change:  Filed Weights   06/05/15 0100  Weight: 65.2 kg (143 lb 11.8 oz)   Body mass index is 25.47 kg/(m^2).   Gen Exam: Awake and alert with clear speech.   Neck: Supple, No JVD.   Chest: B/L Clear.   CVS: S1 S2 Regular, no murmurs.  Abdomen: soft, BS +, non tender, non distended.  Extremities: no edema, lower extremities warm to touch. Neurologic: Non Focal.   Skin: No Rash.   Wounds: N/A.   Intake/Output from previous day:  Intake/Output Summary (Last 24 hours) at 06/06/15 1123 Last data filed at 06/06/15 0900  Gross per 24 hour  Intake    740 ml  Output      0 ml  Net    740 ml     LAB RESULTS: CBC  Recent Labs Lab 06/04/15 1151 06/04/15 1157 06/05/15 0510 06/06/15 0615  WBC 7.3  --  7.0 10.6*  HGB 8.7* 10.9* 7.6* 7.3*  HCT 30.8* 32.0* 25.1* 23.9*  PLT 307  --  286 289  MCV 77.4*  --  77.7* 76.6*  MCH 21.9*  --  23.5* 23.4*  MCHC 28.2*  --  30.3 30.5  RDW 18.5*  --  18.7* 19.0*  LYMPHSABS 1.4  --   --   --   MONOABS 0.4  --   --   --   EOSABS 0.1  --   --   --   BASOSABS 0.0  --   --   --     Chemistries   Recent Labs Lab 06/04/15 1157 06/04/15 1431 06/05/15 0510 06/06/15 0615  NA 142 142 140 138  K 4.9 4.5 4.3 4.3  CL 104 107 103 103  CO2  --  26 25 25   GLUCOSE 106* 105* 97 114*  BUN 38* 36* 29* 23*  CREATININE 1.90* 1.83* 1.62* 1.51*  CALCIUM  --  9.8 9.4 9.5    CBG:  Recent Labs Lab 06/05/15 2051  GLUCAP 154*    GFR Estimated Creatinine Clearance: 34.6 mL/min (by C-G formula based on Cr of 1.51).  Coagulation profile  Recent Labs Lab 06/04/15 1539  06/05/15 0510 06/06/15 0615  INR 1.11 1.03 1.13    Cardiac Enzymes No results for input(s): CKMB, TROPONINI, MYOGLOBIN in the last 168 hours.  Invalid input(s): CK  Invalid input(s): POCBNP No results for input(s): DDIMER in the last 72 hours. No results for input(s): HGBA1C in the last 72 hours. No results for input(s): CHOL, HDL, LDLCALC, TRIG, CHOLHDL, LDLDIRECT in the last 72 hours. No results for input(s): TSH, T4TOTAL, T3FREE, THYROIDAB in the last 72 hours.  Invalid input(s): FREET3  Recent Labs  06/06/15 0803  VITAMINB12 155*  FOLATE 33.4  FERRITIN  26  TIBC 428  IRON 20*  RETICCTPCT 2.1   No results for input(s): LIPASE, AMYLASE in the last 72 hours.  Urine Studies No results for input(s): UHGB, CRYS in the last 72 hours.  Invalid input(s): UACOL, UAPR, USPG, UPH, UTP, UGL, UKET, UBIL, UNIT, UROB, ULEU, UEPI, UWBC, URBC, UBAC, CAST, UCOM, BILUA  MICROBIOLOGY: Recent Results (from the past 240 hour(s))  Urine culture     Status: None   Collection Time: 06/04/15  2:42 PM  Result Value Ref Range Status   Specimen Description URINE, CATHETERIZED  Final   Special Requests NONE  Final   Culture >=100,000 COLONIES/mL YEAST  Final   Report Status 06/05/2015 FINAL  Final  MRSA PCR Screening     Status: None   Collection Time: 06/05/15  1:07 AM  Result Value Ref Range Status   MRSA by PCR NEGATIVE NEGATIVE Final    Comment:        The GeneXpert MRSA Assay (FDA approved for NASAL specimens only), is one component of a comprehensive MRSA colonization surveillance program. It is not intended to diagnose MRSA infection nor to guide or monitor treatment for MRSA infections.     RADIOLOGY STUDIES/RESULTS: Dg Chest 1 View  06/04/2015  CLINICAL DATA:  Fall, altered mental status, injury. EXAM: CHEST 1 VIEW COMPARISON:  02/12/2014 FINDINGS: Cardiomegaly with vascular congestion. No confluent opacities, effusions or edema. No acute bony abnormality or pneumothorax.  IMPRESSION: Cardiomegaly, vascular congestion Electronically Signed   By: Rolm Baptise M.D.   On: 06/04/2015 12:19   Ct Head Wo Contrast  06/04/2015  CLINICAL DATA:  [place] out of chair, hit forehead on ground. EXAM: CT HEAD WITHOUT CONTRAST CT CERVICAL SPINE WITHOUT CONTRAST TECHNIQUE: Multidetector CT imaging of the head and cervical spine was performed following the standard protocol without intravenous contrast. Multiplanar CT image reconstructions of the cervical spine were also generated. COMPARISON:  MRI brain 02/14/2014 FINDINGS: CT HEAD FINDINGS No acute intracranial abnormality. Specifically, no hemorrhage, hydrocephalus, mass lesion, acute infarction, or significant intracranial injury. No acute calvarial abnormality. Visualized paranasal sinuses and mastoids clear. Orbital soft tissues unremarkable. CT CERVICAL SPINE FINDINGS Normal alignment. No fracture or subluxation. Prevertebral soft tissues are normal. No epidural or paraspinal hematoma. IMPRESSION: No acute intracranial abnormality. No acute bony abnormality in the cervical spine. Electronically Signed   By: Rolm Baptise M.D.   On: 06/04/2015 12:35   Ct Cervical Spine Wo Contrast  06/04/2015  CLINICAL DATA:  [place] out of chair, hit forehead on ground. EXAM: CT HEAD WITHOUT CONTRAST CT CERVICAL SPINE WITHOUT CONTRAST TECHNIQUE: Multidetector CT imaging of the head and cervical spine was performed following the standard protocol without intravenous contrast. Multiplanar CT image reconstructions of the cervical spine were also generated. COMPARISON:  MRI brain 02/14/2014 FINDINGS: CT HEAD FINDINGS No acute intracranial abnormality. Specifically, no hemorrhage, hydrocephalus, mass lesion, acute infarction, or significant intracranial injury. No acute calvarial abnormality. Visualized paranasal sinuses and mastoids clear. Orbital soft tissues unremarkable. CT CERVICAL SPINE FINDINGS Normal alignment. No fracture or subluxation. Prevertebral soft  tissues are normal. No epidural or paraspinal hematoma. IMPRESSION: No acute intracranial abnormality. No acute bony abnormality in the cervical spine. Electronically Signed   By: Rolm Baptise M.D.   On: 06/04/2015 12:35   Dg Knee Complete 4 Views Right  06/04/2015  CLINICAL DATA:  65 year old female with leg spasms after fall in nursing home. Initial encounter. EXAM: RIGHT KNEE - COMPLETE 4+ VIEW COMPARISON:  None. FINDINGS: No fracture or dislocation  noted. Bones are osteopenic and if patient has persistent discomfort, follow-up imaging may be considered to exclude subtle injury not detected because of the osteopenia. Tiny suprapatellar joint effusion. Vascular calcifications. IMPRESSION: No fracture or dislocation noted.  Follow-up as discussed above. Electronically Signed   By: Genia Del M.D.   On: 06/04/2015 14:13   Dg Hip Unilat With Pelvis 2-3 Views Left  06/04/2015  CLINICAL DATA:  Fall, injury EXAM: DG HIP (WITH OR WITHOUT PELVIS) 2-3V LEFT COMPARISON:  None. FINDINGS: Prior right hip replacement. Degenerative changes in the left hip. SI joints are symmetric and unremarkable. Diffuse osteopenia. No fracture, subluxation or dislocation. IMPRESSION: No acute bony abnormality.  Degenerative changes in the left hip. Electronically Signed   By: Rolm Baptise M.D.   On: 06/04/2015 12:18    Oren Binet, MD  [facility] Pager:[phone]  If 7PM-7AM, please contact night-coverage [email or web address] Password [email or web address] 06/06/2015, 11:23 AM   LOS: 2 days

## 2015-06-07 ENCOUNTER — Ambulatory Visit (HOSPITAL_COMMUNITY): Admission: RE | Admit: 2015-06-07 | Payer: Medicare Other | Source: Ambulatory Visit

## 2015-06-07 DIAGNOSIS — E119 Type 2 diabetes mellitus without complications: Secondary | ICD-10-CM | POA: Diagnosis not present

## 2015-06-07 DIAGNOSIS — G4700 Insomnia, unspecified: Secondary | ICD-10-CM | POA: Diagnosis not present

## 2015-06-07 DIAGNOSIS — M6281 Muscle weakness (generalized): Secondary | ICD-10-CM | POA: Diagnosis not present

## 2015-06-07 DIAGNOSIS — I6529 Occlusion and stenosis of unspecified carotid artery: Secondary | ICD-10-CM | POA: Diagnosis not present

## 2015-06-07 DIAGNOSIS — I10 Essential (primary) hypertension: Secondary | ICD-10-CM

## 2015-06-07 DIAGNOSIS — E1169 Type 2 diabetes mellitus with other specified complication: Secondary | ICD-10-CM | POA: Diagnosis not present

## 2015-06-07 DIAGNOSIS — K5900 Constipation, unspecified: Secondary | ICD-10-CM | POA: Diagnosis not present

## 2015-06-07 DIAGNOSIS — F329 Major depressive disorder, single episode, unspecified: Secondary | ICD-10-CM | POA: Diagnosis not present

## 2015-06-07 DIAGNOSIS — E785 Hyperlipidemia, unspecified: Secondary | ICD-10-CM | POA: Diagnosis not present

## 2015-06-07 DIAGNOSIS — D500 Iron deficiency anemia secondary to blood loss (chronic): Secondary | ICD-10-CM | POA: Diagnosis not present

## 2015-06-07 DIAGNOSIS — G92 Toxic encephalopathy: Secondary | ICD-10-CM | POA: Diagnosis not present

## 2015-06-07 DIAGNOSIS — I6930 Unspecified sequelae of cerebral infarction: Secondary | ICD-10-CM | POA: Diagnosis not present

## 2015-06-07 DIAGNOSIS — E1149 Type 2 diabetes mellitus with other diabetic neurological complication: Secondary | ICD-10-CM | POA: Diagnosis not present

## 2015-06-07 DIAGNOSIS — Z794 Long term (current) use of insulin: Secondary | ICD-10-CM

## 2015-06-07 DIAGNOSIS — G9340 Encephalopathy, unspecified: Secondary | ICD-10-CM | POA: Diagnosis not present

## 2015-06-07 DIAGNOSIS — E1142 Type 2 diabetes mellitus with diabetic polyneuropathy: Secondary | ICD-10-CM | POA: Diagnosis not present

## 2015-06-07 DIAGNOSIS — I6350 Cerebral infarction due to unspecified occlusion or stenosis of unspecified cerebral artery: Secondary | ICD-10-CM | POA: Diagnosis not present

## 2015-06-07 DIAGNOSIS — G8110 Spastic hemiplegia affecting unspecified side: Secondary | ICD-10-CM | POA: Diagnosis not present

## 2015-06-07 DIAGNOSIS — K922 Gastrointestinal hemorrhage, unspecified: Secondary | ICD-10-CM | POA: Diagnosis not present

## 2015-06-07 DIAGNOSIS — M24541 Contracture, right hand: Secondary | ICD-10-CM | POA: Diagnosis not present

## 2015-06-07 DIAGNOSIS — F411 Generalized anxiety disorder: Secondary | ICD-10-CM | POA: Diagnosis not present

## 2015-06-07 DIAGNOSIS — Z9181 History of falling: Secondary | ICD-10-CM | POA: Diagnosis not present

## 2015-06-07 DIAGNOSIS — N3281 Overactive bladder: Secondary | ICD-10-CM | POA: Diagnosis not present

## 2015-06-07 LAB — CBC
HCT: 32.5 % — ABNORMAL LOW (ref 36.0–46.0)
Hemoglobin: 10.3 g/dL — ABNORMAL LOW (ref 12.0–15.0)
MCH: 24.2 pg — ABNORMAL LOW (ref 26.0–34.0)
MCHC: 31.7 g/dL (ref 30.0–36.0)
MCV: 76.5 fL — ABNORMAL LOW (ref 78.0–100.0)
PLATELETS: 340 10*3/uL (ref 150–400)
RBC: 4.25 MIL/uL (ref 3.87–5.11)
RDW: 18.8 % — AB (ref 11.5–15.5)
WBC: 7.7 10*3/uL (ref 4.0–10.5)

## 2015-06-07 LAB — BASIC METABOLIC PANEL
ANION GAP: 13 (ref 5–15)
BUN: 21 mg/dL — ABNORMAL HIGH (ref 6–20)
CALCIUM: 9.8 mg/dL (ref 8.9–10.3)
CO2: 23 mmol/L (ref 22–32)
CREATININE: 1.47 mg/dL — AB (ref 0.44–1.00)
Chloride: 99 mmol/L — ABNORMAL LOW (ref 101–111)
GFR, EST AFRICAN AMERICAN: 43 mL/min — AB (ref >60)
GFR, EST NON AFRICAN AMERICAN: 37 mL/min — AB (ref >60)
Glucose, Bld: 188 mg/dL — ABNORMAL HIGH (ref 65–99)
Potassium: 3.9 mmol/L (ref 3.5–5.1)
Sodium: 135 mmol/L (ref 135–145)

## 2015-06-07 LAB — TYPE AND SCREEN
ABO/RH(D): B POS
Antibody Screen: NEGATIVE
UNIT DIVISION: 0

## 2015-06-07 LAB — GLUCOSE, CAPILLARY
GLUCOSE-CAPILLARY: 122 mg/dL — AB (ref 65–99)
GLUCOSE-CAPILLARY: 106 mg/dL — AB (ref 65–99)
Glucose-Capillary: 116 mg/dL — ABNORMAL HIGH (ref 65–99)

## 2015-06-07 MED ORDER — AMLODIPINE BESYLATE 5 MG PO TABS
5.0000 mg | ORAL_TABLET | Freq: Every day | ORAL | Status: AC
Start: 2015-06-07 — End: ?

## 2015-06-07 MED ORDER — GABAPENTIN 100 MG PO CAPS: 100.0000 mg | ORAL_CAPSULE | Freq: Every day | ORAL | Status: AC

## 2015-06-07 MED ORDER — INSULIN ASPART 100 UNIT/ML ~~LOC~~ SOLN: SUBCUTANEOUS | Status: DC

## 2015-06-07 MED ORDER — FERROUS SULFATE 325 (65 FE) MG PO TABS: 325.0000 mg | ORAL_TABLET | Freq: Three times a day (TID) | ORAL | Status: DC

## 2015-06-07 MED ORDER — LORAZEPAM 0.5 MG PO TABS: ORAL_TABLET | ORAL | Status: DC

## 2015-06-07 NOTE — NC FL2 (Signed)
[place] LEVEL OF CARE SCREENING TOOL     IDENTIFICATION  Patient Name: Chelsey Gallagher Birthdate: 04/29/1950 Sex: female Admission Date (Current Location): 06/04/2015  [facility] and [place] Number:  Herbalist and Address:  [facility] [place], [place] [address], [place], [place] [address]      Provider Number: [id number]  Attending Physician Name and Address:  Jonetta Osgood, MD  Relative Name and Phone Number:       Current Level of Care: Hospital Recommended Level of Care: [place] Prior Approval Number:    Date Approved/Denied:   PASRR Number:    Discharge Plan: SNF    Current Diagnoses: Patient Active Problem List   Diagnosis Date Noted  . Iron deficiency anemia due to chronic blood loss   . Hematest positive stools   . Toxic metabolic encephalopathy [id number]  . Chronic constipation 05/18/2015  . Neuropathy, diabetic ([place]) 11/27/2014  . Overactive bladder 08/01/2014  . OA (osteoarthritis) 05/08/2014  . Contracture of elbow joint 05/08/2014  . Contracture of right wrist joint 05/08/2014  . Contracture of joint of finger of right hand 05/08/2014  . Generalized anxiety disorder 08/26/2013  . Late effects of CVA (cerebrovascular accident) 07/08/2013  . Depression   . Hypotension 05/08/2013  . Fecal occult blood test positive 05/08/2013  . Altered mental status 05/08/2013  . Insomnia 04/26/2013  . UTI (urinary tract infection) 07/02/2012  . Anemia, iron deficiency 07/02/2012  . Constipation 07/02/2012  . Carotid stenosis 04/13/2012  . Cerebral artery occlusion with cerebral infarction ([place]) 04/13/2012  . Orthostatic hypotension 04/06/2012  . Abnormal TSH 04/06/2012  . Spastic hemiplegia affecting dominant side ([place]) 07/25/2011  . Stroke ([place]) 05/15/2011  . Hyperlipidemia 05/14/2011  . Tobacco abuse 05/14/2011  . CVA (cerebral infarction) 05/08/2011  . Hypertension   . DM (diabetes mellitus), type 2  with neurological complications (HCC)     Orientation RESPIRATION BLADDER Height & Weight     Self, Time, Situation, Place  Normal Incontinent Weight: 148 lb 2.4 oz (67.2 kg) Height:  5\' 3"  (160 cm)  BEHAVIORAL SYMPTOMS/MOOD NEUROLOGICAL BOWEL NUTRITION STATUS      Incontinent Diet (cardiac)  AMBULATORY STATUS COMMUNICATION OF NEEDS Skin   Extensive Assist Verbally Normal                       Personal Care Assistance Level of Assistance  Bathing, Dressing Bathing Assistance: Maximum assistance   Dressing Assistance: Maximum assistance     Functional Limitations Info             SPECIAL CARE FACTORS FREQUENCY  PT (By licensed PT), OT (By licensed OT)     PT Frequency: 5/wk OT Frequency: 5/wk            Contractures      Additional Factors Info  Code Status, Allergies, Psychotropic, Insulin Sliding Scale Code Status Info: DNR Allergies Info: Codeine, Duloxetine Hcl, Flexeril, Lyrica Psychotropic Info: cymbalta, neurontin Insulin Sliding Scale Info: 4/day       Current Medications (06/07/2015):  This is the current hospital active medication list Current Facility-Administered Medications  Medication Dose Route Frequency Provider Last Rate Last Dose  . acetaminophen (TYLENOL) tablet 650 mg  650 mg Oral Q6H PRN Gardiner Barefoot, NP      . amLODipine (NORVASC) tablet 5 mg  5 mg Oral Daily Jonetta Osgood, MD   5 mg at 06/07/15 0908  . aspirin chewable  tablet 81 mg  81 mg Oral Daily Nita Sells, MD   81 mg at 06/07/15 0908  . DULoxetine (CYMBALTA) DR capsule 20 mg  20 mg Oral Daily Nita Sells, MD   20 mg at 06/07/15 0908  . fenofibrate tablet 54 mg  54 mg Oral Daily Jonetta Osgood, MD   54 mg at 06/07/15 0908  . ferrous sulfate tablet 325 mg  325 mg Oral BID WC Nita Sells, MD   325 mg at 06/07/15 0627  . gabapentin (NEURONTIN) capsule 100 mg  100 mg Oral QHS Nita Sells, MD   100 mg at 06/06/15 2232  . hydrALAZINE  (APRESOLINE) injection 10 mg  10 mg Intravenous Q6H PRN Nita Sells, MD   10 mg at 06/06/15 1920  . hydrALAZINE (APRESOLINE) tablet 50 mg  50 mg Oral TID Nita Sells, MD   50 mg at 06/07/15 0908  . HYDROcodone-acetaminophen (NORCO/VICODIN) 5-325 MG per tablet 1 tablet  1 tablet Oral Q6H PRN Gardiner Barefoot, NP   1 tablet at 06/06/15 2232  . insulin aspart (novoLOG) injection 0-9 Units  0-9 Units Subcutaneous TID WC Jonetta Osgood, MD   0 Units at 06/06/15 1200  . insulin detemir (LEVEMIR) injection 6 Units  6 Units Subcutaneous QHS Nita Sells, MD   6 Units at 06/06/15 2233  . pravastatin (PRAVACHOL) tablet 40 mg  40 mg Oral q1800 Jonetta Osgood, MD   40 mg at 06/06/15 1747  . sodium chloride flush (NS) 0.9 % injection 3 mL  3 mL Intravenous Q12H Nita Sells, MD   3 mL at 06/07/15 1000     Discharge Medications: Please see discharge summary for a list of discharge medications.  Relevant Imaging Results:  Relevant Lab Results:   Additional Information SS#: [id number]  Cranford Mon, [place]

## 2015-06-07 NOTE — Progress Notes (Signed)
CSW informed pt ready for DC and is from [facility].  CSW met with pt and confirmed pt is from facility and would like to return.   Patient will discharge to [facility] Anticipated discharge date: 4/6 Family notified: Janett Billow, dtr Transportation by Corey Harold- called at 1:35pm  [place] signing off.  Domenica Reamer, [place] Social Worker [phone]

## 2015-06-07 NOTE — Care Management Important Message (Signed)
Important Message  Patient Details  Name: Chelsey Gallagher MRN: [id number] Date of Birth: 1950-12-02   Medicare Important Message Given:  Yes    W[place] Winegarden [place] 06/07/2015, 10:59 AM

## 2015-06-07 NOTE — Progress Notes (Signed)
Nsg Discharge Note  Admit Date:  06/04/2015 Discharge date: 06/07/2015   Chelsey Gallagher to be D/C'd Nursing Home per MD order.  AVS completed.  Copy for chart, and copy for patient signed, and dated. Patient/caregiver able to verbalize understanding.  Discharge Medication:   Medication List    STOP taking these medications        amoxicillin-clavulanate 875-125 MG tablet  Commonly known as:  AUGMENTIN     baclofen 10 MG tablet  Commonly known as:  LIORESAL     baclofen 20 MG tablet  Commonly known as:  LIORESAL     clopidogrel 75 MG tablet  Commonly known as:  PLAVIX     LINZESS 290 MCG Caps capsule  Generic drug:  Linaclotide     metFORMIN 500 MG tablet  Commonly known as:  GLUCOPHAGE     methocarbamol 500 MG tablet  Commonly known as:  ROBAXIN     rOPINIRole 0.25 MG tablet  Commonly known as:  REQUIP     traMADol 50 MG tablet  Commonly known as:  ULTRAM      TAKE these medications        acetaminophen 325 MG tablet  Commonly known as:  TYLENOL  Take 650 mg by mouth every 6 (six) hours as needed for pain.     albuterol (2.5 MG/3ML) 0.083% nebulizer solution  Commonly known as:  PROVENTIL  Take 3 mLs (2.5 mg total) by nebulization every 4 (four) hours as needed for wheezing or shortness of breath.     amLODipine 5 MG tablet  Commonly known as:  NORVASC  Take 1 tablet (5 mg total) by mouth daily.     aspirin 81 MG chewable tablet  Chew 81 mg by mouth daily.     BENEFIBER Powd  Take 1 scoop by mouth daily. Mix with 8oz of fluid     cholecalciferol 1000 units tablet  Commonly known as:  VITAMIN D  Take 1,000 Units by mouth daily.     DULoxetine 60 MG capsule  Commonly known as:  CYMBALTA  Take 60 mg by mouth daily.     fenofibrate 48 MG tablet  Commonly known as:  TRICOR  Take 48 mg by mouth daily.     ferrous sulfate 325 (65 FE) MG tablet  Take 1 tablet (325 mg total) by mouth 3 (three) times daily with meals.     FLORASTOR PO  Take 1 capsule  by mouth 2 (two) times daily. 2 wks     gabapentin 100 MG capsule  Commonly known as:  NEURONTIN  Take 1 capsule (100 mg total) by mouth at bedtime.     hydrALAZINE 50 MG tablet  Commonly known as:  APRESOLINE  Take 50 mg by mouth 3 (three) times daily.     insulin aspart 100 UNIT/ML injection  Commonly known as:  novoLOG  0-9 Units, Subcutaneous, 3 times daily with meals CBG < 70: implement hypoglycemia protocol CBG 70 - 120: 0 units CBG 121 - 150: 1 unit CBG 151 - 200: 2 units CBG 201 - 250: 3 units CBG 251 - 300: 5 units CBG 301 - 350: 7 units CBG 351 - 400: 9 units CBG > 400: call MD     insulin detemir 100 UNIT/ML injection  Commonly known as:  LEVEMIR  Inject 12 Units into the skin at bedtime.     LORazepam 0.5 MG tablet  Commonly known as:  ATIVAN  Take 1/2 tablet by mouth every  morning for anxiety and take one tablet by mouth at bedtime for rest     magnesium hydroxide 800 MG/5ML suspension  Commonly known as:  MILK OF MAGNESIA  Take 30 mLs by mouth daily as needed for constipation. 30cc     Menthol (Topical Analgesic) 4 % Gel  Commonly known as:  BIOFREEZE  To right ankle and bilateral knees TID     nystatin cream  Commonly known as:  MYCOSTATIN  Apply 1 application topically 3 (three) times daily as needed (rash, redness, or itching).     omega-3 acid ethyl esters 1 g capsule  Commonly known as:  LOVAZA  Take 2 g by mouth 2 (two) times daily.     pravastatin 40 MG tablet  Commonly known as:  PRAVACHOL  Take 40 mg by mouth daily.     promethazine 25 MG tablet  Commonly known as:  PHENERGAN  Take either by mouth, IM injection, or suppository every 6 hours PRN for nausea or vomiting.     sennosides-docusate sodium 8.6-50 MG tablet  Commonly known as:  SENOKOT-S  Take 1 tablet by mouth daily.     solifenacin 5 MG tablet  Commonly known as:  VESICARE  Take 5 mg by mouth daily.     SYSTANE BALANCE 0.6 % Soln  Generic drug:  Propylene Glycol  Place 1 drop  into both eyes 2 (two) times daily.     vitamin C 500 MG tablet  Commonly known as:  ASCORBIC ACID  Take 500 mg by mouth 2 (two) times daily.        Discharge Assessment: Filed Vitals:   06/07/15 0659 06/07/15 1428  BP: 157/61 156/68  Pulse: 90 101  Temp: 98.7 F (37.1 C) 98.8 F (37.1 C)  Resp: 18 16   Skin clean, dry and intact without evidence of skin break down, no evidence of skin tears noted. IV catheter discontinued intact. Site without signs and symptoms of complications - no redness or edema noted at insertion site, patient denies c/o pain - only slight tenderness at site.  Dressing with slight pressure applied.  D/c Instructions-Education: Discharge instructions given to patient/family with verbalized understanding. D/c education completed with patient/family including follow up instructions, medication list, d/c activities limitations if indicated, with other d/c instructions as indicated by MD - patient able to verbalize understanding, all questions fully answered. Patient instructed to return to ED, call 911, or call MD for any changes in condition.  Patient escorted via EMS, report called into [facility].   Dayle Points, RN 06/07/2015 4:57 PM

## 2015-06-07 NOTE — Discharge Summary (Signed)
PATIENT DETAILS Name: [name] Age: 65 y.o. Sex: female Date of Birth: 28-May-1950 MRN: [id number]. Admitting Physician: Nita Sells, MD [id number], Brayton Layman, DO  Admit Date: 06/04/2015 Discharge date: 06/07/2015  Recommendations for Outpatient Follow-up:  1. Plavix discontinued-now only on aspirin 2. Has chronic GI bleeding with guaiac positive stools and microcytic anemia-may require IV iron infusion periodically. Defer to PCP 3. Please repeat CBC/BMET at next visit   PRIMARY DISCHARGE DIAGNOSIS:  Active Problems:   Toxic metabolic encephalopathy   Iron deficiency anemia due to chronic blood loss   Hematest positive stools      PAST MEDICAL HISTORY: Past Medical History  Diagnosis Date  . Diabetes mellitus   . Hypertension   . PAD (peripheral artery disease) ([place])     S/p bypass grafting, unclear exactly where  . Nephrolithiasis   . Hyperlipidemia 05/14/2011  . Hearing loss   . Complication of anesthesia     doesn't wake up good- "usually end up in ICU" also experiences post op nausea and vomiting  . Orthostatic hypotension     after surgery.  Marland Kitchen Heart murmur     PCP- [facility]- No tx needed.  . Foot drop, left   . H/O hiatal hernia     second one  . Diabetic neuropathy (HCC)     Diabetic neuropathy- has inproved since she quit smoking.  . Arthritis   . Depression   . Lung abnormality 05/11/2011  . Stroke ([place]) 05/2011    Weakness on Right. Wears leg brace.  . Iron deficiency anemia   . Chronic GI bleeding     presumed/notes 06/06/2015  . History of blood transfusion 06/2015    DISCHARGE MEDICATIONS: Current Discharge Medication List    START taking these medications   Details  amLODipine (NORVASC) 5 MG tablet Take 1 tablet (5 mg total) by mouth daily.    insulin aspart (NOVOLOG) 100 UNIT/ML injection 0-9 Units, Subcutaneous, 3 times daily with meals CBG < 70: implement hypoglycemia protocol CBG 70 - 120: 0 units CBG 121 - 150: 1  unit CBG 151 - 200: 2 units CBG 201 - 250: 3 units CBG 251 - 300: 5 units CBG 301 - 350: 7 units CBG 351 - 400: 9 units CBG > 400: call MD      CONTINUE these medications which have CHANGED   Details  ferrous sulfate 325 (65 FE) MG tablet Take 1 tablet (325 mg total) by mouth 3 (three) times daily with meals.    gabapentin (NEURONTIN) 100 MG capsule Take 1 capsule (100 mg total) by mouth at bedtime.    LORazepam (ATIVAN) 0.5 MG tablet Take 1/2 tablet by mouth every morning for anxiety and take one tablet by mouth at bedtime for rest Qty: 20 tablet, Refills: 0      CONTINUE these medications which have NOT CHANGED   Details  acetaminophen (TYLENOL) 325 MG tablet Take 650 mg by mouth every 6 (six) hours as needed for pain.    albuterol (PROVENTIL) (2.5 MG/3ML) 0.083% nebulizer solution Take 3 mLs (2.5 mg total) by nebulization every 4 (four) hours as needed for wheezing or shortness of breath. Qty: 75 mL, Refills: 12    aspirin 81 MG chewable tablet Chew 81 mg by mouth daily.    cholecalciferol (VITAMIN D) 1000 UNITS tablet Take 1,000 Units by mouth daily.    DULoxetine (CYMBALTA) 60 MG capsule Take 60 mg by mouth daily.    fenofibrate (TRICOR) 48 MG tablet Take  48 mg by mouth daily.    hydrALAZINE (APRESOLINE) 50 MG tablet Take 50 mg by mouth 3 (three) times daily.    insulin detemir (LEVEMIR) 100 UNIT/ML injection Inject 12 Units into the skin at bedtime.    magnesium hydroxide (MILK OF MAGNESIA) 800 MG/5ML suspension Take 30 mLs by mouth daily as needed for constipation. 30cc    Menthol, Topical Analgesic, (BIOFREEZE) 4 % GEL To right ankle and bilateral knees TID    nystatin cream (MYCOSTATIN) Apply 1 application topically 3 (three) times daily as needed (rash, redness, or itching).     omega-3 acid ethyl esters (LOVAZA) 1 g capsule Take 2 g by mouth 2 (two) times daily.    pravastatin (PRAVACHOL) 40 MG tablet Take 40 mg by mouth daily.     promethazine (PHENERGAN)  25 MG tablet Take either by mouth, IM injection, or suppository every 6 hours PRN for nausea or vomiting.    Propylene Glycol (SYSTANE BALANCE) 0.6 % SOLN Place 1 drop into both eyes 2 (two) times daily.    Saccharomyces boulardii (FLORASTOR PO) Take 1 capsule by mouth 2 (two) times daily. 2 wks    sennosides-docusate sodium (SENOKOT-S) 8.6-50 MG tablet Take 1 tablet by mouth daily.    solifenacin (VESICARE) 5 MG tablet Take 5 mg by mouth daily.    vitamin C (ASCORBIC ACID) 500 MG tablet Take 500 mg by mouth 2 (two) times daily.     Wheat Dextrin (BENEFIBER) POWD Take 1 scoop by mouth daily. Mix with 8oz of fluid      STOP taking these medications     amoxicillin-clavulanate (AUGMENTIN) 875-125 MG tablet      baclofen (LIORESAL) 10 MG tablet      baclofen (LIORESAL) 20 MG tablet      clopidogrel (PLAVIX) 75 MG tablet      Linaclotide (LINZESS) 290 MCG CAPS capsule      metFORMIN (GLUCOPHAGE) 500 MG tablet      methocarbamol (ROBAXIN) 500 MG tablet      rOPINIRole (REQUIP) 0.25 MG tablet      traMADol (ULTRAM) 50 MG tablet         ALLERGIES:   Allergies  Allergen Reactions  . Codeine Other (See Comments)    Per Mar  . Duloxetine Hcl   . Flexeril [Cyclobenzaprine Hcl] Other (See Comments)    Per Mar  . Lyrica [Pregabalin] Other (See Comments)    "wires my up"    BRIEF HPI:  See H&P, Labs, Consult and Test reports for all details in brief,65 year old female with history of CVA with residual right-sided hemiplegia-history of right carotid stenosis requiring stenting in 2013 on aspirin and Plavix chronically, long-standing history of iron deficiency anemia presumed to be secondary to chronic GI bleeding presented to the hospital with acute encephalopathy, and vomiting with some hematemesis.   CONSULTATIONS:   GI  PERTINENT RADIOLOGIC STUDIES: Dg Chest 1 View  06/04/2015  CLINICAL DATA:  Fall, altered mental status, injury. EXAM: CHEST 1 VIEW COMPARISON:   02/12/2014 FINDINGS: Cardiomegaly with vascular congestion. No confluent opacities, effusions or edema. No acute bony abnormality or pneumothorax. IMPRESSION: Cardiomegaly, vascular congestion Electronically Signed   By: Rolm Baptise M.D.   On: 06/04/2015 12:19   Ct Head Wo Contrast  06/04/2015  CLINICAL DATA:  [place] out of chair, hit forehead on ground. EXAM: CT HEAD WITHOUT CONTRAST CT CERVICAL SPINE WITHOUT CONTRAST TECHNIQUE: Multidetector CT imaging of the head and cervical spine was performed following the standard protocol  without intravenous contrast. Multiplanar CT image reconstructions of the cervical spine were also generated. COMPARISON:  MRI brain 02/14/2014 FINDINGS: CT HEAD FINDINGS No acute intracranial abnormality. Specifically, no hemorrhage, hydrocephalus, mass lesion, acute infarction, or significant intracranial injury. No acute calvarial abnormality. Visualized paranasal sinuses and mastoids clear. Orbital soft tissues unremarkable. CT CERVICAL SPINE FINDINGS Normal alignment. No fracture or subluxation. Prevertebral soft tissues are normal. No epidural or paraspinal hematoma. IMPRESSION: No acute intracranial abnormality. No acute bony abnormality in the cervical spine. Electronically Signed   By: Rolm Baptise M.D.   On: 06/04/2015 12:35   Ct Cervical Spine Wo Contrast  06/04/2015  CLINICAL DATA:  [place] out of chair, hit forehead on ground. EXAM: CT HEAD WITHOUT CONTRAST CT CERVICAL SPINE WITHOUT CONTRAST TECHNIQUE: Multidetector CT imaging of the head and cervical spine was performed following the standard protocol without intravenous contrast. Multiplanar CT image reconstructions of the cervical spine were also generated. COMPARISON:  MRI brain 02/14/2014 FINDINGS: CT HEAD FINDINGS No acute intracranial abnormality. Specifically, no hemorrhage, hydrocephalus, mass lesion, acute infarction, or significant intracranial injury. No acute calvarial abnormality. Visualized paranasal sinuses and  mastoids clear. Orbital soft tissues unremarkable. CT CERVICAL SPINE FINDINGS Normal alignment. No fracture or subluxation. Prevertebral soft tissues are normal. No epidural or paraspinal hematoma. IMPRESSION: No acute intracranial abnormality. No acute bony abnormality in the cervical spine. Electronically Signed   By: Rolm Baptise M.D.   On: 06/04/2015 12:35   Dg Knee Complete 4 Views Right  06/04/2015  CLINICAL DATA:  65 year old female with leg spasms after fall in nursing home. Initial encounter. EXAM: RIGHT KNEE - COMPLETE 4+ VIEW COMPARISON:  None. FINDINGS: No fracture or dislocation noted. Bones are osteopenic and if patient has persistent discomfort, follow-up imaging may be considered to exclude subtle injury not detected because of the osteopenia. Tiny suprapatellar joint effusion. Vascular calcifications. IMPRESSION: No fracture or dislocation noted.  Follow-up as discussed above. Electronically Signed   By: Genia Del M.D.   On: 06/04/2015 14:13   Dg Hip Unilat With Pelvis 2-3 Views Left  06/04/2015  CLINICAL DATA:  Fall, injury EXAM: DG HIP (WITH OR WITHOUT PELVIS) 2-3V LEFT COMPARISON:  None. FINDINGS: Prior right hip replacement. Degenerative changes in the left hip. SI joints are symmetric and unremarkable. Diffuse osteopenia. No fracture, subluxation or dislocation. IMPRESSION: No acute bony abnormality.  Degenerative changes in the left hip. Electronically Signed   By: Rolm Baptise M.D.   On: 06/04/2015 12:18     PERTINENT LAB RESULTS: CBC:  Recent Labs  06/06/15 0615 06/07/15 0918  WBC 10.6* 7.7  HGB 7.3* 10.3*  HCT 23.9* 32.5*  PLT 289 340   CMET CMP     Component Value Date/Time   NA 135 06/07/2015 0918   NA 134* 02/08/2015   K 3.9 06/07/2015 0918   CL 99* 06/07/2015 0918   CO2 23 06/07/2015 0918   GLUCOSE 188* 06/07/2015 0918   BUN 21* 06/07/2015 0918   BUN 25* 02/08/2015   CREATININE 1.47* 06/07/2015 0918   CREATININE 0.9 02/08/2015   CALCIUM 9.8  06/07/2015 0918   PROT 6.6 06/06/2015 0615   ALBUMIN 2.9* 06/06/2015 0615   AST 20 06/06/2015 0615   ALT 13* 06/06/2015 0615   ALKPHOS 50 06/06/2015 0615   BILITOT 0.4 06/06/2015 0615   GFRNONAA 37* 06/07/2015 0918   GFRAA 43* 06/07/2015 0918    GFR Estimated Creatinine Clearance: 36.1 mL/min (by C-G formula based on Cr of 1.47). No results for  input(s): LIPASE, AMYLASE in the last 72 hours. No results for input(s): CKTOTAL, CKMB, CKMBINDEX, TROPONINI in the last 72 hours. Invalid input(s): POCBNP No results for input(s): DDIMER in the last 72 hours. No results for input(s): HGBA1C in the last 72 hours. No results for input(s): CHOL, HDL, LDLCALC, TRIG, CHOLHDL, LDLDIRECT in the last 72 hours. No results for input(s): TSH, T4TOTAL, T3FREE, THYROIDAB in the last 72 hours.  Invalid input(s): FREET3  Recent Labs  06/06/15 0803  VITAMINB12 155*  FOLATE 33.4  FERRITIN 26  TIBC 428  IRON 20*  RETICCTPCT 2.1   Coags:  Recent Labs  06/05/15 0510 06/06/15 0615  INR 1.03 1.13   Microbiology: Recent Results (from the past 240 hour(s))  Urine culture     Status: None   Collection Time: 06/04/15  2:42 PM  Result Value Ref Range Status   Specimen Description URINE, CATHETERIZED  Final   Special Requests NONE  Final   Culture >=100,000 COLONIES/mL YEAST  Final   Report Status 06/05/2015 FINAL  Final  MRSA PCR Screening     Status: None   Collection Time: 06/05/15  1:07 AM  Result Value Ref Range Status   MRSA by PCR NEGATIVE NEGATIVE Final    Comment:        The GeneXpert MRSA Assay (FDA approved for NASAL specimens only), is one component of a comprehensive MRSA colonization surveillance program. It is not intended to diagnose MRSA infection nor to guide or monitor treatment for MRSA infections.      BRIEF HOSPITAL COURSE:  Brief narrative: 65 year old female with history of CVA with residual right-sided hemiplegia-history of right carotid stenosis requiring  stenting in 2013 on aspirin and Plavix chronically, long-standing history of iron deficiency anemia presumed to be secondary to chronic GI bleeding presented to the hospital with acute encephalopathy, and vomiting with some hematemesis. Encephalopathy was felt to be secondary to polypharmacy, and now has resolved with supportive care.  Hospital course by problem list: Acute Toxic metabolic encephalopathy: Resolved, completely awake and alert. Multifactorial etiology, secondary to polypharmacy and probable UTI. CT head negative on admission. continue to minimize polypharmacy as much as possible, not sure if urine culture of yeast is a true infection, stopped fluconazole 4/5 (spoke with Dr. Lucianne Lei dam-agreed that likely contamination) and Was monitored off all antimicrobial agents-Remains afebrile and improved.  ARF: Likely prerenal azotemia, downtrending with gentle hydration.Since now euvolemic, suspect does not require any further IV fluids.Has good oral intake. Creatinine on discharge down to 1.47, please check chemistry panel in 1 week.  Microcytic anemia likely secondary to slow/chronic GI bleeding: Has a long-standing history of FOBT stools, on iron supplementation as outpatient. Previous EGD/colonoscopy 2015 negative.Since hemoglobin drifted down to 7.3, patient was transfused 1 unit of PRBC, gastroenterology was consulted, did not think patient needed repeat endoscopic evaluation.Patient was on both aspirin and Plavix, after speaking with stroke M.D.-Dr. Park Liter have discontinued Plavix and now on just aspirin.Iron supplementation has been increased to 3 times a day dosing. Hemoglobin on discharge stable at10.3. Hopefully with further iron supplementation and stopping Plavix, patient's hemoglobin will stabilize. Patient will require very close monitoring of CBC, and may require IV iron as an outpatient.  History of CVA with residual right-sided hemiplegia: on both aspirin and Plavix along with statin.  Given chronic GI bleeding-spoke with Dr. Carilyn Goodpasture M.D. on call-he suggested that we could discontinue Plavix and just place on aspirin.  History of right carotid artery stenosis-status post right ICA stent 2013:  on both aspirin and Plavix, after discussion with Dr. Carilyn Goodpasture M.D.-he advised to continue aspirin and stop Plavix.   Dyslipidemia: Continue Pravachol.  Type 2 diabetes: CBGs stable, continue Levemir-and SSI. Creatinine is 1.47-we have not resumed metformin on discharge  Urinary incontinence: Continue Vesicare.  History of bipolar disorder: Currently stable, continue Cymbalta  TODAY-DAY OF DISCHARGE:  Subjective:   Chelsey Gallagher today has no headache,no chest abdominal pain,no new weakness tingling or numbness, feels much better wants to go home today.   Objective:   Blood pressure 157/61, pulse 90, temperature 98.7 F (37.1 C), temperature source Oral, resp. rate 18, height 5\' 3"  (1.6 m), weight 67.2 kg (148 lb 2.4 oz), SpO2 95 %.  Intake/Output Summary (Last 24 hours) at 06/07/15 1159 Last data filed at 06/07/15 1016  Gross per 24 hour  Intake    935 ml  Output      0 ml  Net    935 ml   Filed Weights   06/05/15 0100 06/06/15 2052  Weight: 65.2 kg (143 lb 11.8 oz) 67.2 kg (148 lb 2.4 oz)    Exam Awake Alert, Oriented *3, No new F.N deficits, Normal affect [place].AT,PERRAL Supple Neck,No JVD, No cervical lymphadenopathy appriciated.  Symmetrical Chest wall movement, Good air movement bilaterally, CTAB RRR,No Gallops,Rubs or new Murmurs, No Parasternal Heave +ve B.Sounds, Abd Soft, Non tender, No organomegaly appriciated, No rebound -guarding or rigidity. No Cyanosis, Clubbing or edema, No new Rash or bruise  DISCHARGE CONDITION: Stable  DISPOSITION: SNF  DISCHARGE INSTRUCTIONS:    Activity:  As tolerated with Full fall precautions use walker/cane & assistance as needed  Get Medicines reviewed and adjusted: Please take all your medications with you  for your next visit with your Primary MD  Please request your Primary MD to go over all hospital tests and procedure/radiological results at the follow up, please ask your Primary MD to get all Hospital records sent to his/her office.  If you experience worsening of your admission symptoms, develop shortness of breath, life threatening emergency, suicidal or homicidal thoughts you must seek medical attention immediately by calling 911 or calling your MD immediately  if symptoms less severe.  You must read complete instructions/literature along with all the possible adverse reactions/side effects for all the Medicines you take and that have been prescribed to you. Take any new Medicines after you have completely understood and accpet all the possible adverse reactions/side effects.   Do not drive when taking Pain medications.   Do not take more than prescribed Pain, Sleep and Anxiety Medications  Special Instructions: If you have smoked or chewed Tobacco  in the last 2 yrs please stop smoking, stop any regular Alcohol  and or any Recreational drug use.  Wear Seat belts while driving.  Please note  You were cared for by a hospitalist during your hospital stay. Once you are discharged, your primary care physician will handle any further medical issues. Please note that NO REFILLS for any discharge medications will be authorized once you are discharged, as it is imperative that you return to your primary care physician (or establish a relationship with a primary care physician if you do not have one) for your aftercare needs so that they can reassess your need for medications and monitor your lab values.   Diet recommendation: Diabetic Diet Heart Healthy diet  Discharge Instructions    Diet - low sodium heart healthy    Complete by:  As directed      Diet  Carb Modified    Complete by:  As directed      Discharge instructions    Complete by:  As directed   Please check CBGs before meals and  at bedtime     Increase activity slowly    Complete by:  As directed            Follow-up Information    Follow up with [name], DO. Schedule an appointment as soon as possible for a visit in 1 week.   Specialty:  Internal Medicine   Why:  Hospital follow up   Contact information:   [place] [id number] [phone]       Total Time spent on discharge equals  45 minutes.  SignedOren Binet 06/07/2015 11:59 AM

## 2015-06-08 ENCOUNTER — Encounter: Payer: Self-pay | Admitting: Adult Health

## 2015-06-08 ENCOUNTER — Non-Acute Institutional Stay (SKILLED_NURSING_FACILITY): Payer: Medicare Other | Admitting: Adult Health

## 2015-06-08 DIAGNOSIS — I10 Essential (primary) hypertension: Secondary | ICD-10-CM | POA: Diagnosis not present

## 2015-06-08 DIAGNOSIS — K5900 Constipation, unspecified: Secondary | ICD-10-CM

## 2015-06-08 DIAGNOSIS — I6529 Occlusion and stenosis of unspecified carotid artery: Secondary | ICD-10-CM | POA: Diagnosis not present

## 2015-06-08 DIAGNOSIS — E1142 Type 2 diabetes mellitus with diabetic polyneuropathy: Secondary | ICD-10-CM

## 2015-06-08 DIAGNOSIS — I6350 Cerebral infarction due to unspecified occlusion or stenosis of unspecified cerebral artery: Secondary | ICD-10-CM

## 2015-06-08 DIAGNOSIS — G8110 Spastic hemiplegia affecting unspecified side: Secondary | ICD-10-CM

## 2015-06-08 DIAGNOSIS — E785 Hyperlipidemia, unspecified: Secondary | ICD-10-CM | POA: Diagnosis not present

## 2015-06-08 DIAGNOSIS — K5909 Other constipation: Secondary | ICD-10-CM

## 2015-06-08 DIAGNOSIS — E1149 Type 2 diabetes mellitus with other diabetic neurological complication: Secondary | ICD-10-CM | POA: Diagnosis not present

## 2015-06-08 DIAGNOSIS — F32A Depression, unspecified: Secondary | ICD-10-CM

## 2015-06-08 DIAGNOSIS — F329 Major depressive disorder, single episode, unspecified: Secondary | ICD-10-CM

## 2015-06-08 DIAGNOSIS — D500 Iron deficiency anemia secondary to blood loss (chronic): Secondary | ICD-10-CM

## 2015-06-08 NOTE — Progress Notes (Signed)
Patient ID: Chelsey Gallagher, female   DOB: Aug 09, 1950, 65 y.o.   MRN: [id number]   Facility: [facility]       Allergies  Allergen Reactions  . Codeine Other (See Comments)    Per Mar  . Duloxetine Hcl   . Flexeril [Cyclobenzaprine Hcl] Other (See Comments)    Per Mar  . Lyrica [Pregabalin] Other (See Comments)    "wires my up"    Chief Complaint  Patient presents with  . Hospitalization Follow-up    Hospital Follow up    HPI:  She is a long term resident of this facility who has been hospitalized for acute encephalopathy; uti which was more than likely contaminated yeast. She has a chronic GI bleed and did require one unit PRBC. She was taken off her plavix and kept on asa due to her chronic bleed. She is not voicing any concerns today. There are no nursing concerns at this time.    Past Medical History  Diagnosis Date  . Diabetes mellitus   . Hypertension   . PAD (peripheral artery disease) ([place])     S/p bypass grafting, unclear exactly where  . Nephrolithiasis   . Hyperlipidemia 05/14/2011  . Hearing loss   . Complication of anesthesia     doesn't wake up good- "usually end up in ICU" also experiences post op nausea and vomiting  . Orthostatic hypotension     after surgery.  Marland Kitchen Heart murmur     PCP- [facility]- No tx needed.  . Foot drop, left   . H/O hiatal hernia     second one  . Diabetic neuropathy (HCC)     Diabetic neuropathy- has inproved since she quit smoking.  . Arthritis   . Depression   . Lung abnormality 05/11/2011  . Stroke ([place]) 05/2011    Weakness on Right. Wears leg brace.  . Iron deficiency anemia   . Chronic GI bleeding     presumed/notes 06/06/2015  . History of blood transfusion 06/2015    Past Surgical History  Procedure Laterality Date  . Total hip arthroplasty  2008    Right  . Abdominal hysterectomy  2003  . Cesarean section  1984  . Cholecystectomy  1984  . Bypass graft  2003    Abdominal aortic  .  Kidney stone surgery  1999  . Appendectomy  2011  . Hernia repair  2011  . Lithotripsy    . Abdominal hysterectomy    . Esophagogastroduodenoscopy N/A 05/09/2013    Procedure: ESOPHAGOGASTRODUODENOSCOPY (EGD);  Surgeon: Lafayette Dragon, MD;  Location: [facility] ENDOSCOPY;  Service: Endoscopy;  Laterality: N/A;  . Colonoscopy N/A 05/10/2013    Procedure: COLONOSCOPY;  Surgeon: Lafayette Dragon, MD;  Location: [facility] ENDOSCOPY;  Service: Endoscopy;  Laterality: N/A;  . Joint replacement    . Carotid stent insertion Right 2013    ICA/notes 06/06/2015    VITAL SIGNS BP 116/65 mmHg  Pulse 94  Temp(Src) 99.8 F (37.7 C) (Oral)  Resp 18  Ht 5\' 3"  (1.6 m)  Wt 151 lb 2 oz (68.55 kg)  BMI 26.78 kg/m2  Patient's Medications  New Prescriptions   No medications on file  Previous Medications   ACETAMINOPHEN (TYLENOL) 325 MG TABLET    Take 650 mg by mouth every 6 (six) hours as needed for pain.   ALBUTEROL (PROVENTIL) (2.5 MG/3ML) 0.083% NEBULIZER SOLUTION    Take 3 mLs (2.5 mg total) by nebulization every 4 (four) hours as  needed for wheezing or shortness of breath.   AMLODIPINE (NORVASC) 5 MG TABLET    Take 1 tablet (5 mg total) by mouth daily.   ASPIRIN 81 MG CHEWABLE TABLET    Chew 81 mg by mouth daily.   CHOLECALCIFEROL (VITAMIN D) 1000 UNITS TABLET    Take 1,000 Units by mouth daily.   DULOXETINE (CYMBALTA) 60 MG CAPSULE    Take 60 mg by mouth daily.   FENOFIBRATE (TRICOR) 48 MG TABLET    Take 48 mg by mouth daily.   FERROUS SULFATE 325 (65 FE) MG TABLET    Take 1 tablet (325 mg total) by mouth 3 (three) times daily with meals.   GABAPENTIN (NEURONTIN) 100 MG CAPSULE    Take 1 capsule (100 mg total) by mouth at bedtime.   HYDRALAZINE (APRESOLINE) 50 MG TABLET    Take 50 mg by mouth 3 (three) times daily.   INSULIN ASPART (NOVOLOG) 100 UNIT/ML INJECTION    0-9 Units, Subcutaneous, 3 times daily with meals CBG < 70: implement hypoglycemia protocol CBG 70 - 120: 0 units CBG 121 - 150: 1 unit CBG 151 - 200:  2 units CBG 201 - 250: 3 units CBG 251 - 300: 5 units CBG 301 - 350: 7 units CBG 351 - 400: 9 units CBG > 400: call MD   INSULIN DETEMIR (LEVEMIR) 100 UNIT/ML INJECTION    Inject 12 Units into the skin at bedtime.   LORAZEPAM (ATIVAN) 0.5 MG TABLET    Take 1/2 tablet by mouth every morning for anxiety and take one tablet by mouth at bedtime for rest   MAGNESIUM HYDROXIDE (MILK OF MAGNESIA) 800 MG/5ML SUSPENSION    Take 30 mLs by mouth daily as needed for constipation. 30cc   MENTHOL, TOPICAL ANALGESIC, (BIOFREEZE) 4 % GEL    To right ankle and bilateral knees TID   NYSTATIN CREAM (MYCOSTATIN)    Apply 1 application topically 3 (three) times daily as needed (rash, redness, or itching).    OMEGA-3 ACID ETHYL ESTERS (LOVAZA) 1 G CAPSULE    Take 2 g by mouth 2 (two) times daily.   PRAVASTATIN (PRAVACHOL) 40 MG TABLET    Take 40 mg by mouth daily.    PROMETHAZINE (PHENERGAN) 25 MG TABLET    Take either by mouth, IM injection, or suppository every 6 hours PRN for nausea or vomiting.   PROPYLENE GLYCOL (SYSTANE BALANCE) 0.6 % SOLN    Place 1 drop into both eyes 2 (two) times daily.   SACCHAROMYCES BOULARDII (FLORASTOR PO)    Take 1 capsule by mouth 2 (two) times daily. 2 wks   SENNOSIDES-DOCUSATE SODIUM (SENOKOT-S) 8.6-50 MG TABLET    Take 1 tablet by mouth daily.   SOLIFENACIN (VESICARE) 5 MG TABLET    Take 5 mg by mouth daily.   VITAMIN C (ASCORBIC ACID) 500 MG TABLET    Take 500 mg by mouth 2 (two) times daily.    WHEAT DEXTRIN (BENEFIBER) POWD    Take 1 scoop by mouth daily. Mix with 8oz of fluid  Modified Medications   No medications on file  Discontinued Medications   No medications on file     SIGNIFICANT DIAGNOSTIC EXAMS  06-04-15: chest x-ray: Cardiomegaly, vascular congestion  06-04-15: bilateral hip and pelvic x-ray: No acute bony abnormality.  Degenerative changes in the left hip  06-04-15: ct of head and cervical spine: No acute intracranial abnormality. No acute bony abnormality in  the cervical spine.  06-04-15: right knee  x-ray: No fracture or dislocation noted.  Follow-up as discussed above    LABS REVIEWED:   02-08-15: glucose 101; bun 25; creat 0.9; k+ 4.6; na++134; liver normal albumin 3.8; hgb a1c 5.3; chol 143; ldl 46; trig 291; hdl 39  05-31-15; wbc 5.8; hgb 7.3; hct 24.1; mcv 75.8; plt 248; iron 17; tibc 388 06-04-15: wbc 7.3; hgb 8.7; hct 30.8; mcv 77.;4 plt 307; glucose 105; bun 36; creat 1.83; k+ 4.5; na++142; liver normal albumin 3.4; urine culture: yeast 06-06-15; wbc 10.6; hgb 7.3; hct 23.9; mcv 76.6; plt 289; glucose 114; bun 23; creat 1.51; k+ 4.3; na++138; liver normal albumin 2.9; iron 20; tibc 428; ferritin 26; folate 33.4; vit B 12: 155 06-07-15; wbc 7.7; hgb 10.3; hct 32.5; mcv 76.5; plt 340; glucose 188; bun 21; creat 1.47; k+ 3.9; na++135        Review of Systems  Constitutional: Negative for malaise/fatigue.  Respiratory: Negative for cough and shortness of breath.   Cardiovascular: Negative for chest pain, palpitations and leg swelling.  Gastrointestinal: Negative for heartburn, abdominal pain and constipation.  Musculoskeletal: Negative for myalgias, back pain and joint pain.  Skin: Negative.   Neurological: Negative for dizziness.  Psychiatric/Behavioral: The patient is not nervous/anxious.     Physical Exam  Constitutional: No distress.  Eyes: Conjunctivae are normal.  Neck: Neck supple. No JVD present. No thyromegaly present.  Cardiovascular: Normal rate, regular rhythm and intact distal pulses.   Murmur heard. Respiratory: Effort normal and breath sounds normal. No respiratory distress. She has no wheezes.  GI: Soft. Bowel sounds are normal. She exhibits distension. There is no tenderness.  Abdomen is rounded   Musculoskeletal: She exhibits no edema.  Right hemiparesis   Lymphadenopathy:    She has no cervical adenopathy.  Neurological: She is alert.  Skin: Skin is warm and dry. She is not diaphoretic.  Psychiatric: She has a  normal mood and affect.      ASSESSMENT/ PLAN:  1.  CVA: has right hemiplegia: will continue asa 81 mg daily  Her baclofen and robaxin were stopped in the hospital   2. Anemia: will continue iron three times  daily she does have slow GI bleed   3. Dyslipidemia: will continue pravachol 40 mg daily lovaza 2 gm twice daily  fenofibrate 48 mg daily   4. 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 Diabetes: will continue levemir 12 units nightly; novolog SSI  6. Hypertension: will continue apresoline 50 mg three times daily norvasc 5 mg daily   7. Depression with anxiety: will continue cymbalta 60 mg daily ; ativan will continue 0.25 mg in the AM and 0.5 mg nightly   8. Diabetic neuropathy: will continue neurontin 100 mg nightly   9. Constipation: will continue senna s daily  benefiber 1 scoop daily   10. Carotid stenosis: status post carotid stent: will continue asa 81 mg daily her plavix was stopped in the hospital    Will check cbc and bmp next blood draw    Time spent with patient  50   minutes >50% time spent counseling; reviewing medical record; tests; labs; and developing future plan of care         Ok Edwards NP [facility]  Contact [phone] Monday through Friday 8am- 5pm  After hours call [phone]

## 2015-06-11 ENCOUNTER — Encounter: Payer: Self-pay | Admitting: Internal Medicine

## 2015-06-11 ENCOUNTER — Non-Acute Institutional Stay (SKILLED_NURSING_FACILITY): Payer: Medicare Other | Admitting: Internal Medicine

## 2015-06-11 DIAGNOSIS — I10 Essential (primary) hypertension: Secondary | ICD-10-CM | POA: Diagnosis not present

## 2015-06-11 DIAGNOSIS — F329 Major depressive disorder, single episode, unspecified: Secondary | ICD-10-CM

## 2015-06-11 DIAGNOSIS — E1149 Type 2 diabetes mellitus with other diabetic neurological complication: Secondary | ICD-10-CM | POA: Diagnosis not present

## 2015-06-11 DIAGNOSIS — G8110 Spastic hemiplegia affecting unspecified side: Secondary | ICD-10-CM | POA: Diagnosis not present

## 2015-06-11 DIAGNOSIS — D500 Iron deficiency anemia secondary to blood loss (chronic): Secondary | ICD-10-CM | POA: Diagnosis not present

## 2015-06-11 DIAGNOSIS — F419 Anxiety disorder, unspecified: Secondary | ICD-10-CM

## 2015-06-11 DIAGNOSIS — I6930 Unspecified sequelae of cerebral infarction: Secondary | ICD-10-CM

## 2015-06-11 DIAGNOSIS — N3281 Overactive bladder: Secondary | ICD-10-CM

## 2015-06-11 DIAGNOSIS — I6529 Occlusion and stenosis of unspecified carotid artery: Secondary | ICD-10-CM | POA: Diagnosis not present

## 2015-06-11 DIAGNOSIS — F418 Other specified anxiety disorders: Secondary | ICD-10-CM

## 2015-06-11 DIAGNOSIS — E785 Hyperlipidemia, unspecified: Secondary | ICD-10-CM | POA: Diagnosis not present

## 2015-06-11 DIAGNOSIS — K5900 Constipation, unspecified: Secondary | ICD-10-CM

## 2015-06-11 DIAGNOSIS — K5909 Other constipation: Secondary | ICD-10-CM

## 2015-06-11 NOTE — Progress Notes (Signed)
Patient ID: ZANI KYLLONEN, female   DOB: January 23, 1951, 65 y.o.   MRN: [id number]    HISTORY AND PHYSICAL   DATE: 06/11/15  Location:  [facility]  Nursing Home Room Number: 104 A Place of Service: SNF (31)   Extended Emergency Contact Information Primary Emergency Contact: [place] [facility] [place] Phone: [phone] Mobile Phone: [phone] Relation: Daughter  Advanced Directive information Does patient have an advance directive?: No, Would patient like information on creating an advanced directive?: No - patient declined information  Chief Complaint  Patient presents with  . Readmit To SNF    HPI:  65 yo female long term resident seen today for readmission into SNF following hospital stay for toxic metabolic encephalopathy, Fe deficiency anemia due to chronic blood loss, Heme positive stools. amlodipne and Novolog SSI started prior to d/c. Gabapentin/Ferrous sulfate and lorazepam doses adjusted. Several meds stopped. GI consulted. plavix stopped. CT head neg for acute process.AKI improved  With d/c Cr 1.47. Hgb dropped to 7.3 and she was transfused 1 unit PRBC. Dc/ Hgb 10.3  Today she reports feeling constipated. No BM x 4 days. She does well with suppository. She uses motorized w/c due to right hemiparesis. Eating well. Sleeping well. No nursing issues. No falls.   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 Takes asa 81 mg daily    Fe deficiency Anemia- takes iron three times daily. she does have slow GI bleed   Dyslipidemia - stable on pravachol 40 mg daily, lovaza 2 gm twice daily,  fenofibrate 48 mg daily. LDL 37   Urge incontinence  - stable on vesicare 5 mg daily   DM - controlled with A1c 5.7%.Takes levemir 12 units nightly; novolog SSI. Neuropathy stable on neurontin  HTN - BP controlled on apresoline 50 mg three times daily, norvasc 5 mg daily   Depression with anxiety- mood stable on cymbalta 60 mg daily ; ativan 0.25 mg in the AM and  0.5 mg nightly.  Constipation - currently on senna s daily  And benefiber 1 scoop daily. She has been refusing MOM per nursing   PAD/b/l Carotid stenosis- status post right carotid sten. She has collateral flow on left. Takes ASA 81 mg daily. Followed by vascular sx  Lung abnormality - gets albuterol neb prn  Hx Hiatal hernia - takes prn phenergan  Past Medical History  Diagnosis Date  . Diabetes mellitus   . Hypertension   . PAD (peripheral artery disease) ([place])     S/p bypass grafting, unclear exactly where  . Nephrolithiasis   . Hyperlipidemia 05/14/2011  . Hearing loss   . Complication of anesthesia     doesn't wake up good- "usually end up in ICU" also experiences post op nausea and vomiting  . Orthostatic hypotension     after surgery.  Marland Kitchen Heart murmur     PCP- [facility]- No tx needed.  . Foot drop, left   . H/O hiatal hernia     second one  . Diabetic neuropathy (HCC)     Diabetic neuropathy- has inproved since she quit smoking.  . Arthritis   . Depression   . Lung abnormality 05/11/2011  . Stroke ([place]) 05/2011    Weakness on Right. Wears leg brace.  . Iron deficiency anemia   . Chronic GI bleeding     presumed/notes 06/06/2015  . History of blood transfusion 06/2015    Past Surgical History  Procedure Laterality Date  . Total hip arthroplasty  2008    Right  . Abdominal hysterectomy  2003  . Cesarean section  1984  . Cholecystectomy  1984  . Bypass graft  2003    Abdominal aortic  . Kidney stone surgery  1999  . Appendectomy  2011  . Hernia repair  2011  . Lithotripsy    . Abdominal hysterectomy    . Esophagogastroduodenoscopy N/A 05/09/2013    Procedure: ESOPHAGOGASTRODUODENOSCOPY (EGD);  Surgeon: Lafayette Dragon, MD;  Location: [facility] ENDOSCOPY;  Service: Endoscopy;  Laterality: N/A;  . Colonoscopy N/A 05/10/2013    Procedure: COLONOSCOPY;  Surgeon: Lafayette Dragon, MD;  Location: [facility] ENDOSCOPY;  Service: Endoscopy;  Laterality: N/A;  . Joint replacement     . Carotid stent insertion Right 2013    ICA/notes 06/06/2015    Patient Care Team: [name], DO as PCP - General (Internal Medicine) [place] ([place]) Lauree Chandler, NP as Nurse Practitioner (Geriatric Medicine)  Social History   Social History  . Marital Status: Divorced    Spouse Name: N/A  . Number of Children: N/A  . Years of Education: N/A   Occupational History  . Not on file.   Social History Main Topics  . Smoking status: Former Smoker -- 0.00 packs/day for 45 years    Types: Cigarettes  . Smokeless tobacco: Former Systems developer    Quit date: 05/08/2011  . Alcohol Use: No  . Drug Use: No  . Sexual Activity: No   Other Topics Concern  . Not on file   Social History Narrative   Lives by herself but still close to family. Not very active, does housework.      reports that she has quit smoking. Her smoking use included Cigarettes. She smoked 0.00 packs per day for 45 years. She quit smokeless tobacco use about 4 years ago. She reports that she does not drink alcohol or use illicit drugs.  Family History  Problem Relation Age of Onset  . Cancer Maternal Aunt     breast   Family Status  Relation Status Death Age  . Mother Alive   . Father Deceased 34  . Maternal Aunt Alive     Immunization History  Administered Date(s) Administered  . Influenza-Unspecified 12/04/2011  . PPD Test 02/14/2014, 06/07/2015  . Pneumococcal Polysaccharide-23 09/03/2011  . Pneumococcal-Unspecified 12/02/2010    Allergies  Allergen Reactions  . Codeine Other (See Comments)    Per Mar  . Duloxetine Hcl   . Flexeril [Cyclobenzaprine Hcl] Other (See Comments)    Per Mar  . Lyrica [Pregabalin] Other (See Comments)    "wires my up"    Medications: Patient's Medications  New Prescriptions   No medications on file  Previous Medications   ACETAMINOPHEN (TYLENOL) 325 MG TABLET    Take 650 mg by mouth every 6 (six) hours as needed for pain.    ALBUTEROL (PROVENTIL) (2.5 MG/3ML) 0.083% NEBULIZER SOLUTION    Take 3 mLs (2.5 mg total) by nebulization every 4 (four) hours as needed for wheezing or shortness of breath.   AMLODIPINE (NORVASC) 5 MG TABLET    Take 1 tablet (5 mg total) by mouth daily.   ASPIRIN 81 MG CHEWABLE TABLET    Chew 81 mg by mouth daily.   CHOLECALCIFEROL (VITAMIN D) 1000 UNITS TABLET    Take 1,000 Units by mouth daily.   DULOXETINE (CYMBALTA) 60 MG CAPSULE    Take 60 mg by mouth daily.   FENOFIBRATE (TRICOR) 48 MG TABLET  Take 48 mg by mouth daily.   FERROUS SULFATE 325 (65 FE) MG TABLET    Take 1 tablet (325 mg total) by mouth 3 (three) times daily with meals.   GABAPENTIN (NEURONTIN) 100 MG CAPSULE    Take 1 capsule (100 mg total) by mouth at bedtime.   HYDRALAZINE (APRESOLINE) 50 MG TABLET    Take 50 mg by mouth 3 (three) times daily.   INSULIN ASPART (NOVOLOG) 100 UNIT/ML INJECTION    0-9 Units, Subcutaneous, 3 times daily with meals CBG < 70: implement hypoglycemia protocol CBG 70 - 120: 0 units CBG 121 - 150: 1 unit CBG 151 - 200: 2 units CBG 201 - 250: 3 units CBG 251 - 300: 5 units CBG 301 - 350: 7 units CBG 351 - 400: 9 units CBG > 400: call MD   INSULIN DETEMIR (LEVEMIR) 100 UNIT/ML INJECTION    Inject 12 Units into the skin at bedtime.   LORAZEPAM (ATIVAN) 0.5 MG TABLET    Take 1/2 tablet by mouth every morning for anxiety and take one tablet by mouth at bedtime for rest   MAGNESIUM HYDROXIDE (MILK OF MAGNESIA) 800 MG/5ML SUSPENSION    Take 30 mLs by mouth daily as needed for constipation. 30cc   MENTHOL, TOPICAL ANALGESIC, (BIOFREEZE) 4 % GEL    To right ankle and bilateral knees TID   NYSTATIN CREAM (MYCOSTATIN)    Apply 1 application topically 3 (three) times daily as needed (rash, redness, or itching).    OMEGA-3 ACID ETHYL ESTERS (LOVAZA) 1 G CAPSULE    Take 2 g by mouth 2 (two) times daily.   PRAVASTATIN (PRAVACHOL) 40 MG TABLET    Take 40 mg by mouth daily.    PROMETHAZINE (PHENERGAN) 25  MG TABLET    Take either by mouth, IM injection, or suppository every 6 hours PRN for nausea or vomiting.   PROPYLENE GLYCOL (SYSTANE BALANCE) 0.6 % SOLN    Place 1 drop into both eyes 2 (two) times daily.   SACCHAROMYCES BOULARDII (FLORASTOR PO)    Take 1 capsule by mouth 2 (two) times daily. 2 wks   SENNOSIDES-DOCUSATE SODIUM (SENOKOT-S) 8.6-50 MG TABLET    Take 1 tablet by mouth daily.   SOLIFENACIN (VESICARE) 5 MG TABLET    Take 5 mg by mouth daily.   VITAMIN C (ASCORBIC ACID) 500 MG TABLET    Take 500 mg by mouth 2 (two) times daily.    WHEAT DEXTRIN (BENEFIBER) POWD    Take 1 scoop by mouth daily. Mix with 8oz of fluid  Modified Medications   No medications on file  Discontinued Medications   No medications on file    Review of Systems  Gastrointestinal: Positive for constipation and abdominal distention.  Musculoskeletal: Positive for arthralgias.  Neurological: Positive for weakness and numbness.  All other systems reviewed and are negative.   Filed Vitals:   06/11/15 1000  BP: 116/65  Pulse: 94  Temp: 99.8 F (37.7 C)  TempSrc: Oral  Resp: 18  Height: '5\' 3"'  (1.6 m)  Weight: 151 lb 3.2 oz (68.584 kg)   Body mass index is 26.79 kg/(m^2).  Physical Exam  Constitutional: She is oriented to person, place, and time. She appears well-developed.  Sitting in w/c in NAD, frail appearing  HENT:  Mouth/Throat: Oropharynx is clear and moist. No oropharyngeal exudate.  Eyes: Pupils are equal, round, and reactive to light. No scleral icterus.  Neck: Neck supple. Carotid bruit is present (R>L). No tracheal  deviation present. No thyromegaly present.  Cardiovascular: Normal rate, regular rhythm and intact distal pulses.  Exam reveals no gallop and no friction rub.   Murmur (2/6 SEM) heard. No LE edema b/l. no calf TTP.   Pulmonary/Chest: Effort normal and breath sounds normal. No stridor. No respiratory distress. She has no wheezes. She has no rales.  Abdominal: Soft. Bowel sounds  are normal. She exhibits distension. She exhibits no mass. There is no hepatomegaly. There is no tenderness. There is no rebound and no guarding.  Musculoskeletal: She exhibits edema.  RUE brace intact; contracture present; RLE brace present  Lymphadenopathy:    She has no cervical adenopathy.  Neurological: She is alert and oriented to person, place, and time.  Right hemiparesis  Skin: Skin is warm and dry. No rash noted.  Psychiatric: She has a normal mood and affect. Her behavior is normal. Judgment and thought content normal.     Labs reviewed: Admission on 06/04/2015, Discharged on 06/07/2015  Component Date Value Ref Range Status  . WBC 06/04/2015 7.3  4.0 - 10.5 K/uL Final  . RBC 06/04/2015 3.98  3.87 - 5.11 MIL/uL Final  . Hemoglobin 06/04/2015 8.7* 12.0 - 15.0 g/dL Final  . HCT 06/04/2015 30.8* 36.0 - 46.0 % Final  . MCV 06/04/2015 77.4* 78.0 - 100.0 fL Final  . MCH 06/04/2015 21.9* 26.0 - 34.0 pg Final  . MCHC 06/04/2015 28.2* 30.0 - 36.0 g/dL Final  . RDW 06/04/2015 18.5* 11.5 - 15.5 % Final  . Platelets 06/04/2015 307  150 - 400 K/uL Final  . Neutrophils Relative % 06/04/2015 75   Final  . Neutro Abs 06/04/2015 5.5  1.7 - 7.7 K/uL Final  . Lymphocytes Relative 06/04/2015 19   Final  . Lymphs Abs 06/04/2015 1.4  0.7 - 4.0 K/uL Final  . Monocytes Relative 06/04/2015 5   Final  . Monocytes Absolute 06/04/2015 0.4  0.1 - 1.0 K/uL Final  . Eosinophils Relative 06/04/2015 1   Final  . Eosinophils Absolute 06/04/2015 0.1  0.0 - 0.7 K/uL Final  . Basophils Relative 06/04/2015 0   Final  . Basophils Absolute 06/04/2015 0.0  0.0 - 0.1 K/uL Final  . Sodium 06/04/2015 142  135 - 145 mmol/L Final  . Potassium 06/04/2015 4.9  3.5 - 5.1 mmol/L Final  . Chloride 06/04/2015 104  101 - 111 mmol/L Final  . BUN 06/04/2015 38* 6 - 20 mg/dL Final  . Creatinine, Ser 06/04/2015 1.90* 0.44 - 1.00 mg/dL Final  . Glucose, Bld 06/04/2015 106* 65 - 99 mg/dL Final  . Calcium, Ion 06/04/2015  1.30  1.13 - 1.30 mmol/L Final  . TCO2 06/04/2015 27  0 - 100 mmol/L Final  . Hemoglobin 06/04/2015 10.9* 12.0 - 15.0 g/dL Final  . HCT 06/04/2015 32.0* 36.0 - 46.0 % Final  . Troponin i, poc 06/04/2015 0.02  0.00 - 0.08 ng/mL Final  . Comment 3 06/04/2015          Final   Comment: Due to the release kinetics of cTnI, a negative result within the first hours of the onset of symptoms does not rule out myocardial infarction with certainty. If myocardial infarction is still suspected, repeat the test at appropriate intervals.   . Color, Urine 06/04/2015 YELLOW  YELLOW Final  . APPearance 06/04/2015 CLOUDY* CLEAR Final  . Specific Gravity, Urine 06/04/2015 1.011  1.005 - 1.030 Final  . pH 06/04/2015 7.0  5.0 - 8.0 Final  . Glucose, UA 06/04/2015 NEGATIVE  NEGATIVE  mg/dL Final  . Hgb urine dipstick 06/04/2015 NEGATIVE  NEGATIVE Final  . Bilirubin Urine 06/04/2015 NEGATIVE  NEGATIVE Final  . Ketones, ur 06/04/2015 NEGATIVE  NEGATIVE mg/dL Final  . Protein, ur 06/04/2015 30* NEGATIVE mg/dL Final  . Nitrite 06/04/2015 NEGATIVE  NEGATIVE Final  . Leukocytes, UA 06/04/2015 LARGE* NEGATIVE Final  . Sodium 06/04/2015 142  135 - 145 mmol/L Final  . Potassium 06/04/2015 4.5  3.5 - 5.1 mmol/L Final  . Chloride 06/04/2015 107  101 - 111 mmol/L Final  . CO2 06/04/2015 26  22 - 32 mmol/L Final  . Glucose, Bld 06/04/2015 105* 65 - 99 mg/dL Final  . BUN 06/04/2015 36* 6 - 20 mg/dL Final  . Creatinine, Ser 06/04/2015 1.83* 0.44 - 1.00 mg/dL Final  . Calcium 06/04/2015 9.8  8.9 - 10.3 mg/dL Final  . Total Protein 06/04/2015 7.3  6.5 - 8.1 g/dL Final  . Albumin 06/04/2015 3.4* 3.5 - 5.0 g/dL Final  . AST 06/04/2015 18  15 - 41 U/L Final  . ALT 06/04/2015 14  14 - 54 U/L Final  . Alkaline Phosphatase 06/04/2015 55  38 - 126 U/L Final  . Total Bilirubin 06/04/2015 0.4  0.3 - 1.2 mg/dL Final  . GFR calc non Af Amer 06/04/2015 28* >60 mL/min Final  . GFR calc Af Amer 06/04/2015 33* >60 mL/min Final    Comment: (NOTE) The eGFR has been calculated using the CKD EPI equation. This calculation has not been validated in all clinical situations. eGFR's persistently <60 mL/min signify possible Chronic Kidney Disease.   . Anion gap 06/04/2015 9  5 - 15 Final  . Fecal Occult Bld 06/04/2015 POSITIVE* NEGATIVE Final  . aPTT 06/04/2015 30  24 - 37 seconds Final  . Prothrombin Time 06/04/2015 14.5  11.6 - 15.2 seconds Final  . INR 06/04/2015 1.11  0.00 - 1.49 Final  . Squamous Epithelial / LPF 06/04/2015 0-5* NONE SEEN Final  . WBC, UA 06/04/2015 TOO NUMEROUS TO COUNT  0 - 5 WBC/hpf Final  . RBC / HPF 06/04/2015 NONE SEEN  0 - 5 RBC/hpf Final  . Bacteria, UA 06/04/2015 FEW* NONE SEEN Final  . Urine-Other 06/04/2015 YEAST PRESENT   Final  . Lactic Acid, Venous 06/04/2015 1.11  0.5 - 2.0 mmol/L Final  . Specimen Description 06/04/2015 URINE, CATHETERIZED   Final  . Special Requests 06/04/2015 NONE   Final  . Culture 06/04/2015 >=100,000 COLONIES/mL YEAST   Final  . Report Status 06/04/2015 06/05/2015 FINAL   Final  . pH, Arterial 06/04/2015 7.365  7.350 - 7.450 Final  . pCO2 arterial 06/04/2015 46.8* 35.0 - 45.0 mmHg Final  . pO2, Arterial 06/04/2015 63.0* 80.0 - 100.0 mmHg Final  . Bicarbonate 06/04/2015 26.8* 20.0 - 24.0 mEq/L Final  . TCO2 06/04/2015 28  0 - 100 mmol/L Final  . O2 Saturation 06/04/2015 91.0   Final  . Acid-Base Excess 06/04/2015 1.0  0.0 - 2.0 mmol/L Final  . Patient temperature 06/04/2015 98.5 F   Final  . Collection site 06/04/2015 RADIAL, ALLEN'S TEST ACCEPTABLE   Final  . Sample type 06/04/2015 ARTERIAL   Final  . Lactic Acid, Venous 06/04/2015 1.08  0.5 - 2.0 mmol/L Final  . Sodium 06/05/2015 140  135 - 145 mmol/L Final  . Potassium 06/05/2015 4.3  3.5 - 5.1 mmol/L Final  . Chloride 06/05/2015 103  101 - 111 mmol/L Final  . CO2 06/05/2015 25  22 - 32 mmol/L Final  . Glucose, Bld 06/05/2015  97  65 - 99 mg/dL Final  . BUN 06/05/2015 29* 6 - 20 mg/dL Final  .  Creatinine, Ser 06/05/2015 1.62* 0.44 - 1.00 mg/dL Final  . Calcium 06/05/2015 9.4  8.9 - 10.3 mg/dL Final  . Total Protein 06/05/2015 6.9  6.5 - 8.1 g/dL Final  . Albumin 06/05/2015 3.1* 3.5 - 5.0 g/dL Final  . AST 06/05/2015 19  15 - 41 U/L Final  . ALT 06/05/2015 14  14 - 54 U/L Final  . Alkaline Phosphatase 06/05/2015 51  38 - 126 U/L Final  . Total Bilirubin 06/05/2015 0.3  0.3 - 1.2 mg/dL Final  . GFR calc non Af Amer 06/05/2015 33* >60 mL/min Final  . GFR calc Af Amer 06/05/2015 38* >60 mL/min Final   Comment: (NOTE) The eGFR has been calculated using the CKD EPI equation. This calculation has not been validated in all clinical situations. eGFR's persistently <60 mL/min signify possible Chronic Kidney Disease.   . Anion gap 06/05/2015 12  5 - 15 Final  . WBC 06/05/2015 7.0  4.0 - 10.5 K/uL Final  . RBC 06/05/2015 3.23* 3.87 - 5.11 MIL/uL Final  . Hemoglobin 06/05/2015 7.6* 12.0 - 15.0 g/dL Final   Comment: DELTA CHECK NOTED REPEATED TO VERIFY   . HCT 06/05/2015 25.1* 36.0 - 46.0 % Final  . MCV 06/05/2015 77.7* 78.0 - 100.0 fL Final  . MCH 06/05/2015 23.5* 26.0 - 34.0 pg Final  . MCHC 06/05/2015 30.3  30.0 - 36.0 g/dL Final  . RDW 06/05/2015 18.7* 11.5 - 15.5 % Final  . Platelets 06/05/2015 286  150 - 400 K/uL Final  . Prothrombin Time 06/05/2015 13.7  11.6 - 15.2 seconds Final  . INR 06/05/2015 1.03  0.00 - 1.49 Final  . MRSA by PCR 06/05/2015 NEGATIVE  NEGATIVE Final   Comment:        The GeneXpert MRSA Assay (FDA approved for NASAL specimens only), is one component of a comprehensive MRSA colonization surveillance program. It is not intended to diagnose MRSA infection nor to guide or monitor treatment for MRSA infections.   . WBC 06/06/2015 10.6* 4.0 - 10.5 K/uL Final  . RBC 06/06/2015 3.12* 3.87 - 5.11 MIL/uL Final  . Hemoglobin 06/06/2015 7.3* 12.0 - 15.0 g/dL Final  . HCT 06/06/2015 23.9* 36.0 - 46.0 % Final  . MCV 06/06/2015 76.6* 78.0 - 100.0 fL Final  .  MCH 06/06/2015 23.4* 26.0 - 34.0 pg Final  . MCHC 06/06/2015 30.5  30.0 - 36.0 g/dL Final  . RDW 06/06/2015 19.0* 11.5 - 15.5 % Final  . Platelets 06/06/2015 289  150 - 400 K/uL Final  . Sodium 06/06/2015 138  135 - 145 mmol/L Final  . Potassium 06/06/2015 4.3  3.5 - 5.1 mmol/L Final  . Chloride 06/06/2015 103  101 - 111 mmol/L Final  . CO2 06/06/2015 25  22 - 32 mmol/L Final  . Glucose, Bld 06/06/2015 114* 65 - 99 mg/dL Final  . BUN 06/06/2015 23* 6 - 20 mg/dL Final  . Creatinine, Ser 06/06/2015 1.51* 0.44 - 1.00 mg/dL Final  . Calcium 06/06/2015 9.5  8.9 - 10.3 mg/dL Final  . Total Protein 06/06/2015 6.6  6.5 - 8.1 g/dL Final  . Albumin 06/06/2015 2.9* 3.5 - 5.0 g/dL Final  . AST 06/06/2015 20  15 - 41 U/L Final  . ALT 06/06/2015 13* 14 - 54 U/L Final  . Alkaline Phosphatase 06/06/2015 50  38 - 126 U/L Final  . Total Bilirubin 06/06/2015 0.4  0.3 - 1.2 mg/dL Final  . GFR calc non Af Amer 06/06/2015 36* >60 mL/min Final  . GFR calc Af Amer 06/06/2015 41* >60 mL/min Final   Comment: (NOTE) The eGFR has been calculated using the CKD EPI equation. This calculation has not been validated in all clinical situations. eGFR's persistently <60 mL/min signify possible Chronic Kidney Disease.   . Anion gap 06/06/2015 10  5 - 15 Final  . Prothrombin Time 06/06/2015 14.7  11.6 - 15.2 seconds Final  . INR 06/06/2015 1.13  0.00 - 1.49 Final  . Glucose-Capillary 06/05/2015 154* 65 - 99 mg/dL Final  . Vitamin B-12 06/06/2015 155* 180 - 914 pg/mL Final   Comment: (NOTE) This assay is not validated for testing neonatal or myeloproliferative syndrome specimens for Vitamin B12 levels.   . Folate 06/06/2015 33.4  >5.9 ng/mL Final  . Iron 06/06/2015 20* 28 - 170 ug/dL Final  . TIBC 06/06/2015 428  250 - 450 ug/dL Final  . Saturation Ratios 06/06/2015 5* 10.4 - 31.8 % Final  . UIBC 06/06/2015 408   Final  . Ferritin 06/06/2015 26  11 - 307 ng/mL Final  . Retic Ct Pct 06/06/2015 2.1  0.4 - 3.1 %  Final  . RBC. 06/06/2015 3.35* 3.87 - 5.11 MIL/uL Final  . Retic Count, Manual 06/06/2015 70.4  19.0 - 186.0 K/uL Final  . ABO/RH(D) 06/06/2015 B POS   Final  . Antibody Screen 06/06/2015 NEG   Final  . Sample Expiration 06/06/2015 06/09/2015   Final  . Unit Number 06/06/2015 [id number]   Final  . Blood Component Type 06/06/2015 RED CELLS,LR   Final  . Unit division 06/06/2015 00   Final  . Status of Unit 06/06/2015 ISSUED,FINAL   Final  . Transfusion Status 06/06/2015 OK TO TRANSFUSE   Final  . Crossmatch Result 06/06/2015 Compatible   Final  . ABO/RH(D) 06/06/2015 B POS   Final  . Order Confirmation 06/06/2015 ORDER PROCESSED BY BLOOD BANK   Final  . Glucose-Capillary 06/06/2015 124* 65 - 99 mg/dL Final  . Glucose-Capillary 06/06/2015 102* 65 - 99 mg/dL Final  . Glucose-Capillary 06/06/2015 122* 65 - 99 mg/dL Final  . WBC 06/07/2015 7.7  4.0 - 10.5 K/uL Final  . RBC 06/07/2015 4.25  3.87 - 5.11 MIL/uL Final  . Hemoglobin 06/07/2015 10.3* 12.0 - 15.0 g/dL Final   REPEATED TO VERIFY  . HCT 06/07/2015 32.5* 36.0 - 46.0 % Final  . MCV 06/07/2015 76.5* 78.0 - 100.0 fL Final  . MCH 06/07/2015 24.2* 26.0 - 34.0 pg Final  . MCHC 06/07/2015 31.7  30.0 - 36.0 g/dL Final  . RDW 06/07/2015 18.8* 11.5 - 15.5 % Final  . Platelets 06/07/2015 340  150 - 400 K/uL Final  . Sodium 06/07/2015 135  135 - 145 mmol/L Final  . Potassium 06/07/2015 3.9  3.5 - 5.1 mmol/L Final  . Chloride 06/07/2015 99* 101 - 111 mmol/L Final  . CO2 06/07/2015 23  22 - 32 mmol/L Final  . Glucose, Bld 06/07/2015 188* 65 - 99 mg/dL Final  . BUN 06/07/2015 21* 6 - 20 mg/dL Final  . Creatinine, Ser 06/07/2015 1.47* 0.44 - 1.00 mg/dL Final  . Calcium 06/07/2015 9.8  8.9 - 10.3 mg/dL Final  . GFR calc non Af Amer 06/07/2015 37* >60 mL/min Final  . GFR calc Af Amer 06/07/2015 43* >60 mL/min Final   Comment: (NOTE) The eGFR has been calculated using the CKD EPI equation. This calculation has not been validated  in all  clinical situations. eGFR's persistently <60 mL/min signify possible Chronic Kidney Disease.   . Anion gap 06/07/2015 13  5 - 15 Final  . Glucose-Capillary 06/07/2015 116* 65 - 99 mg/dL Final  . Comment 1 06/07/2015 Notify RN   Final  . Glucose-Capillary 06/07/2015 106* 65 - 99 mg/dL Final  . Comment 1 06/07/2015 Notify RN   Final  Nursing Home on 05/29/2015  Component Date Value Ref Range Status  . Hemoglobin 05/28/2015 7.6* 12.0 - 16.0 g/dL Final  . HCT 05/28/2015 25* 36 - 46 % Final  . Platelets 05/28/2015 322  150 - 399 K/L Final  . WBC 05/28/2015 7.5   Final  Hospital Outpatient Visit on 05/15/2015  Component Date Value Ref Range Status  . Right CCA prox sys 05/15/2015 159   Final  . Right CCA prox dias 05/15/2015 39   Final  . Left CCA prox sys 05/15/2015 81   Final  . Left CCA prox dias 05/15/2015 2   Final  . Left CCA dist sys 05/15/2015 -117   Final  . Left CCA dist dias 05/15/2015 -15   Final  . Left ICA prox sys 05/15/2015 -115   Final  . Left ICA prox dias 05/15/2015 -34   Final  . Left ICA dist sys 05/15/2015 -41   Final  . Left ICA dist dias 05/15/2015 -18   Final  . RIGHT ECA DIAS 05/15/2015 -13   Final  . RIGHT VERTEBRAL DIAS 05/15/2015 31   Final  . LEFT ECA DIAS 05/15/2015 -14   Final  . LEFT VERTEBRAL DIAS 05/15/2015 9   Final  Nursing Home on 04/16/2015  Component Date Value Ref Range Status  . Glucose 02/08/2015 101   Final  . BUN 02/08/2015 25* 4 - 21 mg/dL Final  . Creatinine 02/08/2015 0.9  0.5 - 1.1 mg/dL Final  . Potassium 02/08/2015 4.6  3.4 - 5.3 mmol/L Final  . Sodium 02/08/2015 134* 137 - 147 mmol/L Final  . Triglycerides 02/08/2015 291* 40 - 160 mg/dL Final  . Cholesterol 02/08/2015 143  0 - 200 mg/dL Final  . HDL 02/08/2015 39  35 - 70 mg/dL Final  . LDL Cholesterol 02/08/2015 46   Final  . Alkaline Phosphatase 02/08/2015 62  25 - 125 U/L Final  . ALT 02/08/2015 13  7 - 35 U/L Final  . AST 02/08/2015 17  13 - 35 U/L Final  . Bilirubin,  Total 02/08/2015 0.3   Final  . Hemoglobin A1C 02/08/2015 5.3   Final    Dg Chest 1 View  06/04/2015  CLINICAL DATA:  Fall, altered mental status, injury. EXAM: CHEST 1 VIEW COMPARISON:  02/12/2014 FINDINGS: Cardiomegaly with vascular congestion. No confluent opacities, effusions or edema. No acute bony abnormality or pneumothorax. IMPRESSION: Cardiomegaly, vascular congestion Electronically Signed   By: Rolm Baptise M.D.   On: 06/04/2015 12:19   Ct Head Wo Contrast  06/04/2015  CLINICAL DATA:  [place] out of chair, hit forehead on ground. EXAM: CT HEAD WITHOUT CONTRAST CT CERVICAL SPINE WITHOUT CONTRAST TECHNIQUE: Multidetector CT imaging of the head and cervical spine was performed following the standard protocol without intravenous contrast. Multiplanar CT image reconstructions of the cervical spine were also generated. COMPARISON:  MRI brain 02/14/2014 FINDINGS: CT HEAD FINDINGS No acute intracranial abnormality. Specifically, no hemorrhage, hydrocephalus, mass lesion, acute infarction, or significant intracranial injury. No acute calvarial abnormality. Visualized paranasal sinuses and mastoids clear. Orbital soft tissues unremarkable. CT CERVICAL SPINE FINDINGS Normal  alignment. No fracture or subluxation. Prevertebral soft tissues are normal. No epidural or paraspinal hematoma. IMPRESSION: No acute intracranial abnormality. No acute bony abnormality in the cervical spine. Electronically Signed   By: Rolm Baptise M.D.   On: 06/04/2015 12:35   Ct Cervical Spine Wo Contrast  06/04/2015  CLINICAL DATA:  [place] out of chair, hit forehead on ground. EXAM: CT HEAD WITHOUT CONTRAST CT CERVICAL SPINE WITHOUT CONTRAST TECHNIQUE: Multidetector CT imaging of the head and cervical spine was performed following the standard protocol without intravenous contrast. Multiplanar CT image reconstructions of the cervical spine were also generated. COMPARISON:  MRI brain 02/14/2014 FINDINGS: CT HEAD FINDINGS No acute  intracranial abnormality. Specifically, no hemorrhage, hydrocephalus, mass lesion, acute infarction, or significant intracranial injury. No acute calvarial abnormality. Visualized paranasal sinuses and mastoids clear. Orbital soft tissues unremarkable. CT CERVICAL SPINE FINDINGS Normal alignment. No fracture or subluxation. Prevertebral soft tissues are normal. No epidural or paraspinal hematoma. IMPRESSION: No acute intracranial abnormality. No acute bony abnormality in the cervical spine. Electronically Signed   By: Rolm Baptise M.D.   On: 06/04/2015 12:35   Dg Knee Complete 4 Views Right  06/04/2015  CLINICAL DATA:  65 year old female with leg spasms after fall in nursing home. Initial encounter. EXAM: RIGHT KNEE - COMPLETE 4+ VIEW COMPARISON:  None. FINDINGS: No fracture or dislocation noted. Bones are osteopenic and if patient has persistent discomfort, follow-up imaging may be considered to exclude subtle injury not detected because of the osteopenia. Tiny suprapatellar joint effusion. Vascular calcifications. IMPRESSION: No fracture or dislocation noted.  Follow-up as discussed above. Electronically Signed   By: Genia Del M.D.   On: 06/04/2015 14:13   Dg Hip Unilat With Pelvis 2-3 Views Left  06/04/2015  CLINICAL DATA:  Fall, injury EXAM: DG HIP (WITH OR WITHOUT PELVIS) 2-3V LEFT COMPARISON:  None. FINDINGS: Prior right hip replacement. Degenerative changes in the left hip. SI joints are symmetric and unremarkable. Diffuse osteopenia. No fracture, subluxation or dislocation. IMPRESSION: No acute bony abnormality.  Degenerative changes in the left hip. Electronically Signed   By: Rolm Baptise M.D.   On: 06/04/2015 12:18     Assessment/Plan   ICD-9-CM ICD-10-CM   1. Chronic constipation 564.00 K59.00   2. Iron deficiency anemia due to chronic blood loss 280.0 D50.0   3. Essential hypertension 401.9 I10   4. History of stroke with residual deficit 438.9 I69.30   5. Spastic hemiplegia affecting  dominant side (HCC) 342.11 G81.10   6. DM (diabetes mellitus), type 2 with neurological complications ([place]) 102.11 E11.49   7. Carotid stenosis, unspecified laterality 433.10 I65.29   8. Hyperlipidemia 272.4 E78.5   9. Anxiety and depression 300.4 F41.8   10. Overactive bladder 596.51 N32.81    Start dulcolax supp 1 PR daily prn constipation  Cont other meds as ordered  F/u with specialists as scheduled  PT/OT/ST as ordered  GOAL: short term rehab followed by continued long term care. Communicated with pt and nursing.  Will follow  Dyshaun Bonzo S. Perlie Gold  [facility] [address] [place],  [address] [phone] Cell (Monday-Friday 8 AM - 5 PM) [phone] After 5 PM and follow prompts

## 2015-06-19 DIAGNOSIS — F329 Major depressive disorder, single episode, unspecified: Secondary | ICD-10-CM | POA: Diagnosis not present

## 2015-06-19 DIAGNOSIS — F411 Generalized anxiety disorder: Secondary | ICD-10-CM | POA: Diagnosis not present

## 2015-06-19 DIAGNOSIS — G4700 Insomnia, unspecified: Secondary | ICD-10-CM | POA: Diagnosis not present

## 2015-06-20 LAB — BASIC METABOLIC PANEL
BUN: 35 mg/dL — AB (ref 4–21)
Creatinine: 1.7 mg/dL — AB (ref 0.5–1.1)
GLUCOSE: 93 mg/dL
Potassium: 4.2 mmol/L (ref 3.4–5.3)
Sodium: 136 mmol/L — AB (ref 137–147)

## 2015-06-20 LAB — CBC AND DIFFERENTIAL
HEMATOCRIT: 24 % — AB (ref 36–46)
HEMOGLOBIN: 7.8 g/dL — AB (ref 12.0–16.0)
PLATELETS: 261 10*3/uL (ref 150–399)
WBC: 7 10*3/mL

## 2015-07-02 LAB — CBC AND DIFFERENTIAL
HEMATOCRIT: 28 % — AB (ref 36–46)
HEMOGLOBIN: 8.6 g/dL — AB (ref 12.0–16.0)
Platelets: 343 10*3/uL (ref 150–399)
WBC: 9.7 10^3/mL

## 2015-07-03 LAB — CBC AND DIFFERENTIAL
HEMATOCRIT: 26 % — AB (ref 36–46)
HEMOGLOBIN: 8 g/dL — AB (ref 12.0–16.0)
PLATELETS: 218 10*3/uL (ref 150–399)
WBC: 5.3 10*3/mL

## 2015-07-04 DIAGNOSIS — F329 Major depressive disorder, single episode, unspecified: Secondary | ICD-10-CM | POA: Diagnosis not present

## 2015-07-04 DIAGNOSIS — G4700 Insomnia, unspecified: Secondary | ICD-10-CM | POA: Diagnosis not present

## 2015-07-04 DIAGNOSIS — F411 Generalized anxiety disorder: Secondary | ICD-10-CM | POA: Diagnosis not present

## 2015-07-06 ENCOUNTER — Encounter: Payer: Self-pay | Admitting: Adult Health

## 2015-07-06 ENCOUNTER — Ambulatory Visit: Payer: Medicare Other | Admitting: Gastroenterology

## 2015-07-06 ENCOUNTER — Non-Acute Institutional Stay (SKILLED_NURSING_FACILITY): Payer: Medicare Other | Admitting: Adult Health

## 2015-07-06 DIAGNOSIS — E1169 Type 2 diabetes mellitus with other specified complication: Secondary | ICD-10-CM

## 2015-07-06 DIAGNOSIS — E1142 Type 2 diabetes mellitus with diabetic polyneuropathy: Secondary | ICD-10-CM

## 2015-07-06 DIAGNOSIS — E1149 Type 2 diabetes mellitus with other diabetic neurological complication: Secondary | ICD-10-CM

## 2015-07-06 DIAGNOSIS — N3281 Overactive bladder: Secondary | ICD-10-CM | POA: Diagnosis not present

## 2015-07-06 DIAGNOSIS — I6350 Cerebral infarction due to unspecified occlusion or stenosis of unspecified cerebral artery: Secondary | ICD-10-CM | POA: Diagnosis not present

## 2015-07-06 DIAGNOSIS — I10 Essential (primary) hypertension: Secondary | ICD-10-CM | POA: Diagnosis not present

## 2015-07-06 DIAGNOSIS — I6529 Occlusion and stenosis of unspecified carotid artery: Secondary | ICD-10-CM | POA: Diagnosis not present

## 2015-07-06 DIAGNOSIS — D500 Iron deficiency anemia secondary to blood loss (chronic): Secondary | ICD-10-CM

## 2015-07-06 DIAGNOSIS — F411 Generalized anxiety disorder: Secondary | ICD-10-CM | POA: Diagnosis not present

## 2015-07-06 DIAGNOSIS — E785 Hyperlipidemia, unspecified: Secondary | ICD-10-CM

## 2015-07-06 DIAGNOSIS — E1122 Type 2 diabetes mellitus with diabetic chronic kidney disease: Secondary | ICD-10-CM | POA: Insufficient documentation

## 2015-07-06 NOTE — Progress Notes (Signed)
Patient ID: Chelsey Gallagher, female   DOB: October 13, 1950, 65 y.o.   MRN: [id number]   Facility: [name]     CODE STATUS: Full Code  Allergies  Allergen Reactions  . Codeine Other (See Comments)    Per Mar  . Duloxetine Hcl   . Flexeril [Cyclobenzaprine Hcl] Other (See Comments)    Per Mar  . Lyrica [Pregabalin] Other (See Comments)    "wires my up"    Chief Complaint  Patient presents with  . Medical Management of Chronic Issues    Folllow up    HPI:  She is long term resident of this facility being seen for the management of her chronic illnesses. Overall her status is stable. She does have a slow GI bleed and is iron for this her hgb remains stable. She is not voicing any complaints or concerns at this time. There are no nursing concerns at this time.   Past Medical History  Diagnosis Date  . Diabetes mellitus   . Hypertension   . PAD (peripheral artery disease) ([place])     S/p bypass grafting, unclear exactly where  . Nephrolithiasis   . Hyperlipidemia 05/14/2011  . Hearing loss   . Complication of anesthesia     doesn't wake up good- "usually end up in ICU" also experiences post op nausea and vomiting  . Orthostatic hypotension     after surgery.  Marland Kitchen Heart murmur     PCP- [facility]- No tx needed.  . Foot drop, left   . H/O hiatal hernia     second one  . Diabetic neuropathy (HCC)     Diabetic neuropathy- has inproved since she quit smoking.  . Arthritis   . Depression   . Lung abnormality 05/11/2011  . Stroke ([place]) 05/2011    Weakness on Right. Wears leg brace.  . Iron deficiency anemia   . Chronic GI bleeding     presumed/notes 06/06/2015  . History of blood transfusion 06/2015    Past Surgical History  Procedure Laterality Date  . Total hip arthroplasty  2008    Right  . Abdominal hysterectomy  2003  . Cesarean section  1984  . Cholecystectomy  1984  . Bypass graft  2003    Abdominal aortic  . Kidney stone surgery  1999  . Appendectomy   2011  . Hernia repair  2011  . Lithotripsy    . Abdominal hysterectomy    . Esophagogastroduodenoscopy N/A 05/09/2013    Procedure: ESOPHAGOGASTRODUODENOSCOPY (EGD);  Surgeon: Lafayette Dragon, MD;  [facility]: [facility] ENDOSCOPY;  Service: Endoscopy;  Laterality: N/A;  . Colonoscopy N/A 05/10/2013    Procedure: COLONOSCOPY;  Surgeon: Lafayette Dragon, MD;  Location: [facility] ENDOSCOPY;  Service: Endoscopy;  Laterality: N/A;  . Joint replacement    . Carotid stent insertion Right 2013    ICA/notes 06/06/2015    Social History   Social History  . Marital Status: Divorced    Spouse Name: N/A  . Number of Children: N/A  . Years of Education: N/A   Occupational History  . Not on file.   Social History Main Topics  . Smoking status: Former Smoker -- 0.00 packs/day for 45 years    Types: Cigarettes  . Smokeless tobacco: Former Systems developer    Quit date: 05/08/2011  . Alcohol Use: No  . Drug Use: No  . Sexual Activity: No   Other Topics Concern  . Not on file   Social History Narrative   Lives by  herself but still close to family. Not very active, does housework.    Family History  Problem Relation Age of Onset  . Cancer Maternal Aunt     breast      VITAL SIGNS BP 124/65 mmHg  Pulse 90  Temp(Src) 99 F (37.2 C) (Oral)  Resp 18  Ht 5\' 3"  (1.6 m)  Wt 143 lb 8 oz (65.091 kg)  BMI 25.43 kg/m2  Patient's Medications  New Prescriptions   No medications on file  Previous Medications   ACETAMINOPHEN (TYLENOL) 325 MG TABLET    Take 650 mg by mouth every 6 (six) hours as needed for pain.   ALBUTEROL (PROVENTIL) (2.5 MG/3ML) 0.083% NEBULIZER SOLUTION    Take 3 mLs (2.5 mg total) by nebulization every 4 (four) hours as needed for wheezing or shortness of breath.   AMINO ACIDS-PROTEIN HYDROLYS (FEEDING SUPPLEMENT, PRO-STAT SUGAR FREE 64,) LIQD    Take 30 mLs by mouth 3 (three) times daily with meals.   AMLODIPINE (NORVASC) 5 MG TABLET    Take 1 tablet (5 mg total) by mouth daily.   ASPIRIN 81 MG  CHEWABLE TABLET    Chew 81 mg by mouth daily.   CHOLECALCIFEROL (VITAMIN D) 1000 UNITS TABLET    Take 1,000 Units by mouth daily.   DULOXETINE (CYMBALTA) 60 MG CAPSULE    Take 60 mg by mouth daily.   FENOFIBRATE (TRICOR) 48 MG TABLET    Take 48 mg by mouth daily.   FERROUS SULFATE 325 (65 FE) MG TABLET    Take 1 tablet (325 mg total) by mouth 3 (three) times daily with meals.   GABAPENTIN (NEURONTIN) 100 MG CAPSULE    Take 1 capsule (100 mg total) by mouth at bedtime.   HYDRALAZINE (APRESOLINE) 50 MG TABLET    Take 50 mg by mouth 3 (three) times daily.   INSULIN ASPART (NOVOLOG) 100 UNIT/ML INJECTION    0-9 Units, Subcutaneous, 3 times daily with meals CBG < 70: implement hypoglycemia protocol CBG 70 - 120: 0 units CBG 121 - 150: 1 unit CBG 151 - 200: 2 units CBG 201 - 250: 3 units CBG 251 - 300: 5 units CBG 301 - 350: 7 units CBG 351 - 400: 9 units CBG > 400: call MD   INSULIN DETEMIR (LEVEMIR) 100 UNIT/ML INJECTION    Inject 12 Units into the skin at bedtime.   LORAZEPAM (ATIVAN) 0.5 MG TABLET    Take 1/2 tablet by mouth every morning for anxiety and take one tablet by mouth at bedtime for rest   MAGNESIUM HYDROXIDE (MILK OF MAGNESIA) 800 MG/5ML SUSPENSION    Take 30 mLs by mouth daily as needed for constipation. 30cc   MENTHOL, TOPICAL ANALGESIC, (BIOFREEZE) 4 % GEL    To right ankle and bilateral knees TID   MULTIPLE VITAMINS-MINERALS (MULTI VITAMIN/MINERALS) TABS    Take 1 tablet by mouth daily.   NYSTATIN CREAM (MYCOSTATIN)    Apply 1 application topically 3 (three) times daily as needed (rash, redness, or itching).    OMEGA-3 ACID ETHYL ESTERS (LOVAZA) 1 G CAPSULE    Take 2 g by mouth 2 (two) times daily.   PRAVASTATIN (PRAVACHOL) 40 MG TABLET    Take 40 mg by mouth daily.    PROMETHAZINE (PHENERGAN) 25 MG TABLET    Take either by mouth, IM injection, or suppository every 6 hours PRN for nausea or vomiting.   PROPYLENE GLYCOL (SYSTANE BALANCE) 0.6 % SOLN    Place 1  drop into both  eyes 2 (two) times daily.   SENNOSIDES-DOCUSATE SODIUM (SENOKOT-S) 8.6-50 MG TABLET    Take by mouth 2 (two) times daily.    SOLIFENACIN (VESICARE) 5 MG TABLET    Take 5 mg by mouth daily.   VITAMIN C (ASCORBIC ACID) 500 MG TABLET    Take 500 mg by mouth 2 (two) times daily.    WHEAT DEXTRIN (BENEFIBER) POWD    Take 1 scoop by mouth daily. Mix with 8oz of fluid  Modified Medications   No medications on file  Discontinued Medications   No medications on file     SIGNIFICANT DIAGNOSTIC EXAMS  06-04-15: chest x-ray: Cardiomegaly, vascular congestion  06-04-15: bilateral hip and pelvic x-ray: No acute bony abnormality.  Degenerative changes in the left hip  06-04-15: ct of head and cervical spine: No acute intracranial abnormality. No acute bony abnormality in the cervical spine.  06-04-15: right knee x-ray: No fracture or dislocation noted.  Follow-up as discussed above    LABS REVIEWED:   02-08-15: glucose 101; bun 25; creat 0.9; k+ 4.6; na++134; liver normal albumin 3.8; hgb a1c 5.3; chol 143; ldl 46; trig 291; hdl 39  05-31-15; wbc 5.8; hgb 7.3; hct 24.1; mcv 75.8; plt 248; iron 17; tibc 388 06-04-15: wbc 7.3; hgb 8.7; hct 30.8; mcv 77.;4 plt 307; glucose 105; bun 36; creat 1.83; k+ 4.5; na++142; liver normal albumin 3.4; urine culture: yeast 06-06-15; wbc 10.6; hgb 7.3; hct 23.9; mcv 76.6; plt 289; glucose 114; bun 23; creat 1.51; k+ 4.3; na++138; liver normal albumin 2.9; iron 20; tibc 428; ferritin 26; folate 33.4; vit B 12: 155 06-07-15; wbc 7.7; hgb 10.3; hct 32.5; mcv 76.5; plt 340; glucose 188; bun 21; creat 1.47; k+ 3.9; na++135  06-09-15: wbc 8.2; hgb 9.5; hgb 30.7; mcv 77.5; plt 328; glucose 93; bun 39; creat 1.79; k+ 4.5; na++ 136; liver normal albumin 3.7; hgb a1c 5.7  chol 118; ldl 48; trig 185; hdl 33  06-11-15: wbc 9.9; hgb 9.4; hct 30.8; mcv 79.2; plt 265; glucose 240; bun 36; creat 1.84; k+ 4.5; na++187 06-20-15: wbc 7.0; hgb 7.8; hct 24.5; mcv 77.0; plt 261; glucose 93; bun 35; creat  1.74; k+ 4.2;na++136        Review of Systems  Constitutional: Negative for malaise/fatigue.  Respiratory: Negative for cough and shortness of breath.   Cardiovascular: Negative for chest pain, palpitations and leg swelling.  Gastrointestinal: Negative for heartburn, abdominal pain and constipation.  Musculoskeletal: Negative for myalgias, back pain and joint pain.  Skin: Negative.   Neurological: Negative for dizziness.  Psychiatric/Behavioral: The patient is not nervous/anxious.     Physical Exam  Constitutional: No distress.  Eyes: Conjunctivae are normal.  Neck: Neck supple. No JVD present. No thyromegaly present.  Cardiovascular: Normal rate, regular rhythm and intact distal pulses.   Murmur heard. Respiratory: Effort normal and breath sounds normal. No respiratory distress. She has no wheezes.  GI: Soft. Bowel sounds are normal. She exhibits distension. There is no tenderness.  Abdomen is mildly  rounded   Musculoskeletal: She exhibits no edema.  Right hemiparesis   Lymphadenopathy:    She has no cervical adenopathy.  Neurological: She is alert.  Skin: Skin is warm and dry. She is not diaphoretic.  Psychiatric: She has a normal mood and affect.      ASSESSMENT/ PLAN:  1.  CVA: has right hemiplegia: will continue asa 81 mg daily  Will monitor   2. Anemia: will continue iron  three times  daily she does have slow GI bleed hgb is stable at 7.8  3. Dyslipidemia: will continue pravachol 40 mg daily lovaza 2 gm twice daily  fenofibrate 48 mg daily ldl is 48; trig 185   4. 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 Diabetes: will continue levemir 12 units nightly; novolog SSI  hgb a1c is 5.7   6. Hypertension: will continue apresoline 50 mg three times daily norvasc 5 mg daily   7. Depression with anxiety: will continue cymbalta 60 mg daily ; ativan will continue 0.25 mg in the AM and 0.5 mg nightly   8. Diabetic neuropathy: will continue neurontin 100 mg nightly     9. Constipation: will continue senna s daily  benefiber 1 scoop daily   10. Carotid stenosis: status post carotid stent: will continue asa 81 mg daily  Will monitor      Ok Edwards NP [facility]  Contact [phone] Monday through Friday 8am- 5pm  After hours call [phone]

## 2015-07-12 ENCOUNTER — Ambulatory Visit (HOSPITAL_COMMUNITY): Admission: RE | Admit: 2015-07-12 | Payer: Medicare Other | Source: Ambulatory Visit

## 2015-07-17 DIAGNOSIS — N3941 Urge incontinence: Secondary | ICD-10-CM | POA: Diagnosis not present

## 2015-07-17 DIAGNOSIS — N390 Urinary tract infection, site not specified: Secondary | ICD-10-CM | POA: Diagnosis not present

## 2015-07-17 DIAGNOSIS — Z0000 Encounter for general adult medical examination without abnormal findings: Secondary | ICD-10-CM | POA: Diagnosis not present

## 2015-07-18 ENCOUNTER — Telehealth: Payer: Self-pay | Admitting: Oncology

## 2015-07-18 NOTE — Telephone Encounter (Signed)
Verified address and insurance, contacted [facility] scheduled and confirmed appt, completed intake

## 2015-07-19 ENCOUNTER — Telehealth: Payer: Self-pay | Admitting: *Deleted

## 2015-07-19 ENCOUNTER — Ambulatory Visit (HOSPITAL_BASED_OUTPATIENT_CLINIC_OR_DEPARTMENT_OTHER): Payer: Medicare Other | Admitting: Oncology

## 2015-07-19 ENCOUNTER — Telehealth: Payer: Self-pay | Admitting: Oncology

## 2015-07-19 ENCOUNTER — Ambulatory Visit (HOSPITAL_BASED_OUTPATIENT_CLINIC_OR_DEPARTMENT_OTHER): Payer: Medicare Other

## 2015-07-19 VITALS — BP 111/71 | HR 87 | Temp 98.9°F | Resp 18 | Ht 63.0 in | Wt 143.0 lb

## 2015-07-19 DIAGNOSIS — Z803 Family history of malignant neoplasm of breast: Secondary | ICD-10-CM

## 2015-07-19 DIAGNOSIS — Z8673 Personal history of transient ischemic attack (TIA), and cerebral infarction without residual deficits: Secondary | ICD-10-CM

## 2015-07-19 DIAGNOSIS — D509 Iron deficiency anemia, unspecified: Secondary | ICD-10-CM

## 2015-07-19 DIAGNOSIS — Z87891 Personal history of nicotine dependence: Secondary | ICD-10-CM

## 2015-07-19 DIAGNOSIS — D500 Iron deficiency anemia secondary to blood loss (chronic): Secondary | ICD-10-CM

## 2015-07-19 LAB — CBC WITH DIFFERENTIAL/PLATELET
BASO%: 0 % (ref 0.0–2.0)
BASOS ABS: 0 10*3/uL (ref 0.0–0.1)
EOS%: 1.5 % (ref 0.0–7.0)
Eosinophils Absolute: 0.1 10*3/uL (ref 0.0–0.5)
HEMATOCRIT: 26.9 % — AB (ref 34.8–46.6)
HEMOGLOBIN: 8.4 g/dL — AB (ref 11.6–15.9)
LYMPH#: 1.9 10*3/uL (ref 0.9–3.3)
LYMPH%: 23.4 % (ref 14.0–49.7)
MCH: 25.1 pg (ref 25.1–34.0)
MCHC: 31.2 g/dL — AB (ref 31.5–36.0)
MCV: 80.5 fL (ref 79.5–101.0)
MONO#: 0.4 10*3/uL (ref 0.1–0.9)
MONO%: 5 % (ref 0.0–14.0)
NEUT#: 5.8 10*3/uL (ref 1.5–6.5)
NEUT%: 70.1 % (ref 38.4–76.8)
PLATELETS: 256 10*3/uL (ref 145–400)
RBC: 3.34 10*6/uL — ABNORMAL LOW (ref 3.70–5.45)
RDW: 20.5 % — AB (ref 11.2–14.5)
WBC: 8.3 10*3/uL (ref 3.9–10.3)

## 2015-07-19 NOTE — Telephone Encounter (Signed)
Per staff message and POF I have scheduled appts. Advised scheduler of appts. JMW Per staff message and POF I have scheduled appts. Advised scheduler of appts. JMW  

## 2015-07-19 NOTE — Telephone Encounter (Signed)
"  I received a message to call from Dr. Hazeline Junker nurse.  She didn't say why, just for me to call."  Call transferred ext [phone].

## 2015-07-19 NOTE — Telephone Encounter (Signed)
per pof to sch pt appt-MW sch fera-gave pt copy of avs

## 2015-07-19 NOTE — Consult Note (Signed)
Reason for Referral: Iron deficiency anemia.   HPI: 65 year old woman currently of [place] and have been residing in a skilled nursing facility last few years. She has suffered a stroke in 2013 after presenting with right-sided weakness and slurred speech at that time. 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 She was also found to have iron deficiency anemia dating back to March 2013 when she presented with an iron of less than 10. She had GI workup including colonoscopy and endoscopy on few occasions without any clear cut etiology of her GI bleed. She was recently hospitalized in April 2017 after she was found to have a hemoglobin of 7.3. She received packed red cell transfusions and repeat iron studies at that time showed iron level of 20, saturation of 5% and a ferritin of 26. GI was consulted during her hospitalization and did not feel any further workup is needed. She had been started on oral iron and have been taken 3 times a day. Since her discharge, she reports her energy has improved although she is severely constipated from iron supplements. She reports some dyspepsia and discoloration of the color of her stool. She does not report any sleepiness, dyspnea or chest pain. She denied any hematochezia or melena. She denied any hemoptysis or hematemesis. She does use a motorized wheelchair for ambulation.  She does not report any headaches, blurry vision, syncope or seizures. She does not report any fevers, chills, sweats or weight loss. She does not report any chest pain, palpitation, orthopnea or leg edema. She does not report any cough, wheezing or hemoptysis. Does not report any nausea, vomiting or abdominal pain. She does not report any frequency urgency or hesitancy. She does not report any skeletal complaints. Remaining review of systems unremarkable.   Past Medical History  Diagnosis Date  . Diabetes mellitus   . Hypertension   . PAD (peripheral artery disease) ([place])     S/p bypass  grafting, unclear exactly where  . Nephrolithiasis   . Hyperlipidemia 05/14/2011  . Hearing loss   . Complication of anesthesia     doesn't wake up good- "usually end up in ICU" also experiences post op nausea and vomiting  . Orthostatic hypotension     after surgery.  Marland Kitchen Heart murmur     PCP- [facility]- No tx needed.  . Foot drop, left   . H/O hiatal hernia     second one  . Diabetic neuropathy (H[facility])     Diabetic neuropathy- has inproved since she quit smoking.  . Arthritis   . Depression   . Lung abnormality 05/11/2011  . Stroke ([place]) 05/2011    Weakness on Right. Wears leg brace.  . Iron deficiency anemia   . Chronic GI bleeding     presumed/notes 06/06/2015  . History of blood transfusion 06/2015  :  Past Surgical History  Procedure Laterality Date  . Total hip arthroplasty  2008    Right  . Abdominal hysterectomy  2003  . Cesarean section  1984  . Cholecystectomy  1984  . Bypass graft  2003    Abdominal aortic  . Kidney stone surgery  1999  . Appendectomy  2011  . Hernia repair  2011  . Lithotripsy    . Abdominal hysterectomy    . Esophagogastroduodenoscopy N/A 05/09/2013    Procedure: ESOPHAGOGASTRODUODENOSCOPY (EGD);  Surgeon: Lafayette Dragon, MD;  Location: [facility] ENDOSCOPY;  Service: Endoscopy;  Laterality: N/A;  . Colonoscopy N/A 05/10/2013    Procedure: COLONOSCOPY;  Surgeon: Lafayette Dragon, MD;  Location: [facility] ENDOSCOPY;  Service: Endoscopy;  Laterality: N/A;  . Joint replacement    . Carotid stent insertion Right 2013    ICA/notes 06/06/2015  :   Current outpatient prescriptions:  .  acetaminophen (TYLENOL) 325 MG tablet, Take 650 mg by mouth every 6 (six) hours as needed for pain., Disp: , Rfl:  .  albuterol (PROVENTIL) (2.5 MG/3ML) 0.083% nebulizer solution, Take 3 mLs (2.5 mg total) by nebulization every 4 (four) hours as needed for wheezing or shortness of breath., Disp: 75 mL, Rfl: 12 .  Amino Acids-Protein Hydrolys (FEEDING SUPPLEMENT, PRO-STAT SUGAR  FREE 64,) LIQD, Take 30 mLs by mouth 3 (three) times daily with meals., Disp: , Rfl:  .  amLODipine (NORVASC) 5 MG tablet, Take 1 tablet (5 mg total) by mouth daily., Disp: , Rfl:  .  aspirin 81 MG chewable tablet, Chew 81 mg by mouth daily., Disp: , Rfl:  .  cholecalciferol (VITAMIN D) 1000 UNITS tablet, Take 1,000 Units by mouth daily., Disp: , Rfl:  .  DULoxetine (CYMBALTA) 60 MG capsule, Take 60 mg by mouth daily., Disp: , Rfl:  .  fenofibrate (TRICOR) 48 MG tablet, Take 48 mg by mouth daily., Disp: , Rfl:  .  ferrous sulfate 325 (65 FE) MG tablet, Take 1 tablet (325 mg total) by mouth 3 (three) times daily with meals., Disp: , Rfl:  .  gabapentin (NEURONTIN) 100 MG capsule, Take 1 capsule (100 mg total) by mouth at bedtime., Disp: , Rfl:  .  hydrALAZINE (APRESOLINE) 50 MG tablet, Take 50 mg by mouth 3 (three) times daily., Disp: , Rfl:  .  insulin aspart (NOVOLOG) 100 UNIT/ML injection, 0-9 Units, Subcutaneous, 3 times daily with meals CBG < 70: implement hypoglycemia protocol CBG 70 - 120: 0 units CBG 121 - 150: 1 unit CBG 151 - 200: 2 units CBG 201 - 250: 3 units CBG 251 - 300: 5 units CBG 301 - 350: 7 units CBG 351 - 400: 9 units CBG > 400: call MD, Disp: , Rfl:  .  insulin detemir (LEVEMIR) 100 UNIT/ML injection, Inject 12 Units into the skin at bedtime., Disp: , Rfl:  .  LORazepam (ATIVAN) 0.5 MG tablet, Take 1/2 tablet by mouth every morning for anxiety and take one tablet by mouth at bedtime for rest, Disp: 20 tablet, Rfl: 0 .  magnesium hydroxide (MILK OF MAGNESIA) 800 MG/5ML suspension, Take 30 mLs by mouth daily as needed for constipation. 30cc, Disp: , Rfl:  .  Menthol, Topical Analgesic, (BIOFREEZE) 4 % GEL, To right ankle and bilateral knees TID, Disp: , Rfl:  .  Multiple Vitamins-Minerals (MULTI VITAMIN/MINERALS) TABS, Take 1 tablet by mouth daily., Disp: , Rfl:  .  nystatin cream (MYCOSTATIN), Apply 1 application topically 3 (three) times daily as needed (rash, redness, or  itching). , Disp: , Rfl:  .  omega-3 acid ethyl esters (LOVAZA) 1 g capsule, Take 2 g by mouth 2 (two) times daily., Disp: , Rfl:  .  pravastatin (PRAVACHOL) 40 MG tablet, Take 40 mg by mouth daily. , Disp: , Rfl:  .  promethazine (PHENERGAN) 25 MG tablet, Take either by mouth, IM injection, or suppository every 6 hours PRN for nausea or vomiting., Disp: , Rfl:  .  Propylene Glycol (SYSTANE BALANCE) 0.6 % SOLN, Place 1 drop into both eyes 2 (two) times daily., Disp: , Rfl:  .  sennosides-docusate sodium (SENOKOT-S) 8.6-50 MG tablet, Take by mouth 2 (two) times  daily. , Disp: , Rfl:  .  solifenacin (VESICARE) 5 MG tablet, Take 5 mg by mouth daily., Disp: , Rfl:  .  vitamin C (ASCORBIC ACID) 500 MG tablet, Take 500 mg by mouth 2 (two) times daily. , Disp: , Rfl:  .  Wheat Dextrin (BENEFIBER) POWD, Take 1 scoop by mouth daily. Mix with 8oz of fluid, Disp: , Rfl: :  Allergies  Allergen Reactions  . Codeine Other (See Comments)    Per Mar  . Duloxetine Hcl   . Flexeril [Cyclobenzaprine Hcl] Other (See Comments)    Per Mar  . Lyrica [Pregabalin] Other (See Comments)    "wires my up"  :  Family History  Problem Relation Age of Onset  . Cancer Maternal Aunt     breast  :  Social History   Social History  . Marital Status: Divorced    Spouse Name: N/A  . Number of Children: N/A  . Years of Education: N/A   Occupational History  . Not on file.   Social History Main Topics  . Smoking status: Former Smoker -- 0.00 packs/day for 45 years    Types: Cigarettes  . Smokeless tobacco: Former Systems developer    Quit date: 05/08/2011  . Alcohol Use: No  . Drug Use: No  . Sexual Activity: No   Other Topics Concern  . Not on file   Social History Narrative   Lives by herself but still close to family. Not very active, does housework.   :  Pertinent items are noted in HPI.  Exam: Blood pressure 111/71, pulse 87, temperature 98.9 F (37.2 C), temperature source Oral, resp. rate 18, height 5'  3" (1.6 m), weight 143 lb (64.864 kg), SpO2 95 %. General appearance: alert and cooperative Head: Normocephalic, without obvious abnormality Throat: lips, mucosa, and tongue normal; teeth and gums normal Neck: no adenopathy Back: negative Resp: clear to auscultation bilaterally Chest wall: no tenderness Cardio: regular rate and rhythm, S1, S2 normal, no murmur, click, rub or gallop GI: soft, non-tender; bowel sounds normal; no masses,  no organomegaly Extremities: extremities normal, atraumatic, no cyanosis or edema Pulses: 2+ and symmetric Skin: Skin color, texture, turgor normal. No rashes or lesions Lymph nodes: Cervical, supraclavicular, and axillary nodes normal.    CBC    Component Value Date/Time   WBC 7.0 06/20/2015   WBC 7.7 06/07/2015 0918   RBC 4.25 06/07/2015 0918   RBC 3.35* 06/06/2015 0803   HGB 7.8* 06/20/2015   HCT 24* 06/20/2015   PLT 261 06/20/2015   MCV 76.5* 06/07/2015 0918   MCH 24.2* 06/07/2015 0918   MCHC 31.7 06/07/2015 0918   RDW 18.8* 06/07/2015 0918   LYMPHSABS 1.4 06/04/2015 1151   MONOABS 0.4 06/04/2015 1151   EOSABS 0.1 06/04/2015 1151   BASOSABS 0.0 06/04/2015 1151      Chemistry      Component Value Date/Time   NA 136* 06/20/2015   NA 135 06/07/2015 0918   K 4.2 06/20/2015   CL 99* 06/07/2015 0918   CO2 23 06/07/2015 0918   BUN 35* 06/20/2015   BUN 21* 06/07/2015 0918   CREATININE 1.7* 06/20/2015   CREATININE 1.47* 06/07/2015 0918   GLU 93 06/20/2015      Component Value Date/Time   CALCIUM 9.8 06/07/2015 0918   ALKPHOS 50 06/06/2015 0615   AST 20 06/06/2015 0615   ALT 13* 06/06/2015 0615   BILITOT 0.4 06/06/2015 0615       Assessment and Plan:  65 year old woman with the following issues:  1. Iron deficiency anemia dating back to at least 2013. At that time her iron was less than 10 and a hemoglobin as low as 8.4. She had fluctuations in her hemoglobin and have required packed red cell transfusions most recently in  April 2017. Her GI workup did not show clear-cut evidence of active bleeding.  The differential diagnosis was discussed with her today. She certainly appear to have GI blood losses that could be microscopic in nature and have been less unresponsive to oral iron therapy. Her iron replacement have been problematic especially with GI toxicities such as constipation and dyspepsia. Based on these findings and the fact that she has clear-cut iron deficiency as a she would benefit from total IV iron replacement. Risks and benefits of Feraheme infusion were reviewed. Complications such as arthralgias, myalgias as well as infusion related complications were reviewed and she is agreeable to proceed.  Other etiologies for anemia could be related to renal insufficiency with a creatinine ranging close to 1.7 and a clearance of close to 40 mL/m. Growth factor support can be utilized in the future once iron stores have been adequately have been repleted.  I have recommended to continue oral iron replacement at a lower doses because of her constipation issues. Iron once a day for maintenance purposes may be reasonable.  2. Heme positive stool: I recommended follow-up with gastroenterology for further investigation for possible source of GI bleeding.  3. Follow-up: We will continue to monitor her iron studies closely and replace as needed to avoid further packed red cell transfusions and hospitalizations.

## 2015-07-19 NOTE — Progress Notes (Signed)
Please see consult note.  

## 2015-07-20 LAB — FERRITIN CHCC: Ferritin: 41 ng/mL (ref 15–150)

## 2015-07-20 LAB — IRON AND TIBC CHCC
IRON: 30 ug/dL (ref 27–139)
Iron Saturation: 10 % — ABNORMAL LOW (ref 15–55)
Total Iron Binding Capacity: 299 ug/dL (ref 250–450)
UIBC: 269 ug/dL (ref 118–369)

## 2015-07-25 ENCOUNTER — Other Ambulatory Visit: Payer: Self-pay | Admitting: *Deleted

## 2015-07-25 MED ORDER — LORAZEPAM 0.5 MG PO TABS: ORAL_TABLET | ORAL | Status: DC

## 2015-07-25 NOTE — Telephone Encounter (Signed)
[facility] 

## 2015-07-26 DIAGNOSIS — N390 Urinary tract infection, site not specified: Secondary | ICD-10-CM | POA: Diagnosis not present

## 2015-07-27 ENCOUNTER — Ambulatory Visit (HOSPITAL_BASED_OUTPATIENT_CLINIC_OR_DEPARTMENT_OTHER): Payer: Medicare Other

## 2015-07-27 VITALS — BP 124/59 | HR 82 | Temp 97.2°F | Resp 18

## 2015-07-27 DIAGNOSIS — D500 Iron deficiency anemia secondary to blood loss (chronic): Secondary | ICD-10-CM | POA: Diagnosis present

## 2015-07-27 MED ORDER — SODIUM CHLORIDE 0.9 % IV SOLN
Freq: Once | INTRAVENOUS | Status: AC
  Administered 2015-07-27: 10:00:00 via INTRAVENOUS

## 2015-07-27 MED ORDER — SODIUM CHLORIDE 0.9 % IV SOLN
510.0000 mg | Freq: Once | INTRAVENOUS | Status: AC
  Administered 2015-07-27: 510 mg via INTRAVENOUS
  Filled 2015-07-27: qty 17

## 2015-07-27 NOTE — Patient Instructions (Addendum)
Iron Deficiency Anemia, Adult Anemia is a condition in which there are less red blood cells or hemoglobin in the blood than normal. Hemoglobin is the part of red blood cells that carries oxygen. Iron deficiency anemia is anemia caused by too little iron. It is the most common type of anemia. It may leave you tired and short of breath. CAUSES   Lack of iron in the diet.  Poor absorption of iron, as seen with intestinal disorders.  Intestinal bleeding.  Heavy periods. SIGNS AND SYMPTOMS  Mild anemia may not be noticeable. Symptoms may include:  Fatigue.  Headache.  Pale skin.  Weakness.  Tiredness.  Shortness of breath.  Dizziness.  Cold hands and feet.  Fast or irregular heartbeat. DIAGNOSIS  Diagnosis requires a thorough evaluation and physical exam by your health care provider. Blood tests are generally used to confirm iron deficiency anemia. Additional tests may be done to find the underlying cause of your anemia. These may include:  Testing for blood in the stool (fecal occult blood test).  A procedure to see inside the colon and rectum (colonoscopy).  A procedure to see inside the esophagus and stomach (endoscopy). TREATMENT  Iron deficiency anemia is treated by correcting the cause of the deficiency. Treatment may involve:  Adding iron-rich foods to your diet.  Taking iron supplements. Pregnant or breastfeeding women need to take extra iron because their normal diet usually does not provide the required amount.  Taking vitamins. Vitamin C improves the absorption of iron. Your health care provider may recommend that you take your iron tablets with a glass of orange juice or vitamin C supplement.  Medicines to make heavy menstrual flow lighter.  Surgery. HOME CARE INSTRUCTIONS   Take iron as directed by your health care provider.  If you cannot tolerate taking iron supplements by mouth, talk to your health care provider about taking them through a vein  (intravenously) or an injection into a muscle.  For the best iron absorption, iron supplements should be taken on an empty stomach. If you cannot tolerate them on an empty stomach, you may need to take them with food.  Do not drink milk or take antacids at the same time as your iron supplements. Milk and antacids may interfere with the absorption of iron.  Iron supplements can cause constipation. Make sure to include fiber in your diet to prevent constipation. A stool softener may also be recommended.  Take vitamins as directed by your health care provider.  Eat a diet rich in iron. Foods high in iron include liver, lean beef, whole-grain bread, eggs, dried fruit, and dark green leafy vegetables. SEEK IMMEDIATE MEDICAL CARE IF:   You faint. If this happens, do not drive. Call your local emergency services (911 in [place] if no other help is available.  You have chest pain.  You feel nauseous or vomit.  You have severe or increased shortness of breath with activity.  You feel weak.  You have a rapid heartbeat.  You have unexplained sweating.  You become light-headed when getting up from a chair or bed. MAKE SURE YOU:   Understand these instructions.  Will watch your condition.  Will get help right away if you are not doing well or get worse.   This information is not intended to replace advice given to you by your health care provider. Make sure you discuss any questions you have with your health care provider.   Document Released: 02/15/2000 Document Revised: 03/10/2014 Document Reviewed: 10/25/2012 Elsevier   Interactive Patient Education 2016 [place]. Ferumoxytol injection What is this medicine? FERUMOXYTOL is an iron complex. Iron is used to make healthy red blood cells, which carry oxygen and nutrients throughout the body. This medicine is used to treat iron deficiency anemia in people with chronic kidney disease. This medicine may be used for other purposes; ask your  health care provider or pharmacist if you have questions. What should I tell my health care provider before I take this medicine? They need to know if you have any of these conditions: -anemia not caused by low iron levels -high levels of iron in the blood -magnetic resonance imaging (MRI) test scheduled -an unusual or allergic reaction to iron, other medicines, foods, dyes, or preservatives -pregnant or trying to get pregnant -breast-feeding How should I use this medicine? This medicine is for injection into a vein. It is given by a health care professional in a hospital or clinic setting. Talk to your pediatrician regarding the use of this medicine in children. Special care may be needed. Overdosage: If you think you have taken too much of this medicine contact a poison control center or emergency room at once. NOTE: This medicine is only for you. Do not share this medicine with others. What if I miss a dose? It is important not to miss your dose. Call your doctor or health care professional if you are unable to keep an appointment. What may interact with this medicine? This medicine may interact with the following medications: -other iron products This list may not describe all possible interactions. Give your health care provider a list of all the medicines, herbs, non-prescription drugs, or dietary supplements you use. Also tell them if you smoke, drink alcohol, or use illegal drugs. Some items may interact with your medicine. What should I watch for while using this medicine? Visit your doctor or healthcare professional regularly. Tell your doctor or healthcare professional if your symptoms do not start to get better or if they get worse. You may need blood work done while you are taking this medicine. You may need to follow a special diet. Talk to your doctor. Foods that contain iron include: whole grains/cereals, dried fruits, beans, or peas, leafy green vegetables, and organ meats  (liver, kidney). What side effects may I notice from receiving this medicine? Side effects that you should report to your doctor or health care professional as soon as possible: -allergic reactions like skin rash, itching or hives, swelling of the face, lips, or tongue -breathing problems -changes in blood pressure -feeling faint or lightheaded, falls -fever or chills -flushing, sweating, or hot feelings -swelling of the ankles or feet Side effects that usually do not require medical attention (Report these to your doctor or health care professional if they continue or are bothersome.): -diarrhea -headache -nausea, vomiting -stomach pain This list may not describe all possible side effects. Call your doctor for medical advice about side effects. You may report side effects to FDA at 1-800-FDA-1088. Where should I keep my medicine? This drug is given in a hospital or clinic and will not be stored at home. NOTE: This sheet is a summary. It may not cover all possible information. If you have questions about this medicine, talk to your doctor, pharmacist, or health care provider.    2016, Elsevier/Gold Standard. (2011-10-03 15:23:36)

## 2015-07-31 DIAGNOSIS — N200 Calculus of kidney: Secondary | ICD-10-CM | POA: Diagnosis not present

## 2015-07-31 DIAGNOSIS — N281 Cyst of kidney, acquired: Secondary | ICD-10-CM | POA: Diagnosis not present

## 2015-07-31 DIAGNOSIS — B3741 Candidal cystitis and urethritis: Secondary | ICD-10-CM | POA: Diagnosis not present

## 2015-07-31 DIAGNOSIS — Z0000 Encounter for general adult medical examination without abnormal findings: Secondary | ICD-10-CM | POA: Diagnosis not present

## 2015-07-31 DIAGNOSIS — Z87440 Personal history of urinary (tract) infections: Secondary | ICD-10-CM | POA: Diagnosis not present

## 2015-07-31 DIAGNOSIS — N390 Urinary tract infection, site not specified: Secondary | ICD-10-CM | POA: Diagnosis not present

## 2015-08-01 ENCOUNTER — Ambulatory Visit (HOSPITAL_COMMUNITY): Payer: Medicare Other

## 2015-08-03 ENCOUNTER — Ambulatory Visit (HOSPITAL_BASED_OUTPATIENT_CLINIC_OR_DEPARTMENT_OTHER): Payer: Medicare Other

## 2015-08-03 VITALS — BP 136/93 | HR 91 | Temp 98.0°F | Resp 18

## 2015-08-03 DIAGNOSIS — D500 Iron deficiency anemia secondary to blood loss (chronic): Secondary | ICD-10-CM

## 2015-08-03 MED ORDER — SODIUM CHLORIDE 0.9 % IV SOLN
510.0000 mg | Freq: Once | INTRAVENOUS | Status: AC
  Administered 2015-08-03: 510 mg via INTRAVENOUS
  Filled 2015-08-03: qty 17

## 2015-08-03 MED ORDER — SODIUM CHLORIDE 0.9 % IV SOLN
Freq: Once | INTRAVENOUS | Status: AC
  Administered 2015-08-03: 13:00:00 via INTRAVENOUS

## 2015-08-03 NOTE — Patient Instructions (Signed)

## 2015-08-07 ENCOUNTER — Ambulatory Visit (HOSPITAL_COMMUNITY): Payer: Medicare Other

## 2015-08-10 ENCOUNTER — Encounter: Payer: Self-pay | Admitting: Nurse Practitioner

## 2015-08-10 ENCOUNTER — Non-Acute Institutional Stay (SKILLED_NURSING_FACILITY): Payer: Medicare Other | Admitting: Nurse Practitioner

## 2015-08-10 DIAGNOSIS — N390 Urinary tract infection, site not specified: Secondary | ICD-10-CM

## 2015-08-10 DIAGNOSIS — I10 Essential (primary) hypertension: Secondary | ICD-10-CM

## 2015-08-10 DIAGNOSIS — N3281 Overactive bladder: Secondary | ICD-10-CM | POA: Diagnosis not present

## 2015-08-10 DIAGNOSIS — F418 Other specified anxiety disorders: Secondary | ICD-10-CM | POA: Diagnosis not present

## 2015-08-10 DIAGNOSIS — D500 Iron deficiency anemia secondary to blood loss (chronic): Secondary | ICD-10-CM

## 2015-08-10 DIAGNOSIS — E1149 Type 2 diabetes mellitus with other diabetic neurological complication: Secondary | ICD-10-CM | POA: Diagnosis not present

## 2015-08-10 DIAGNOSIS — F419 Anxiety disorder, unspecified: Secondary | ICD-10-CM

## 2015-08-10 DIAGNOSIS — F329 Major depressive disorder, single episode, unspecified: Secondary | ICD-10-CM

## 2015-08-10 DIAGNOSIS — I6350 Cerebral infarction due to unspecified occlusion or stenosis of unspecified cerebral artery: Secondary | ICD-10-CM | POA: Diagnosis not present

## 2015-08-10 DIAGNOSIS — R809 Proteinuria, unspecified: Secondary | ICD-10-CM | POA: Diagnosis not present

## 2015-08-10 NOTE — Progress Notes (Signed)
Patient ID: Chelsey Gallagher, female   DOB: 01-May-1950, 65 y.o.   MRN: [id number]    Nursing Home Location:  [place] of Service: SNF (31)  PCP: [name], DO  Allergies  Allergen Reactions  . Codeine Other (See Comments)    Per Mar  . Duloxetine Hcl   . Flexeril [Cyclobenzaprine Hcl] Other (See Comments)    Per Mar  . Lyrica [Pregabalin] Other (See Comments)    "wires my up"    Chief Complaint  Patient presents with  . Medical Management of Chronic Issues    Routine Visit    HPI:  Patient is a 65 y.o. female seen today at [facility] for routine follow up. Pt with a hx of CVA with spastic hemiplegia, constipation, anxiety, DM, HTN, and hyperlipidemia. Pt has been doing well in the last month. No issues per nursing. Pt reports she feels well. conts to go to hematology for iron infusion.  Currently being followed by urology due to chronic urinary incontinence and UTI. Currently on bactrim for UTI. Without worsening of symptoms at this time. Bowels moving well on current regimen. No changes in pain. Pain controlled on current regimen.   Review of Systems:  Review of Systems  Constitutional: Negative for activity change, appetite change, fatigue and unexpected weight change.  HENT: Negative for congestion, hearing loss and sinus pressure.   Eyes: Negative.   Respiratory: Negative for cough and shortness of breath.   Cardiovascular: Negative for chest pain, palpitations and leg swelling.  Gastrointestinal: Positive for constipation (controlled on medications). Negative for abdominal pain and diarrhea.  Genitourinary: Positive for frequency (vesicare helps). Negative for dysuria, vaginal discharge, difficulty urinating and vaginal pain.  Musculoskeletal: Negative for arthralgias.  Skin: Negative for color change and wound.  Neurological: Negative for dizziness and weakness. Numbness: right side.  Psychiatric/Behavioral: Negative for  behavioral problems and confusion. The patient is nervous/anxious (well controlled on current regimen).     Past Medical History  Diagnosis Date  . Diabetes mellitus   . Hypertension   . PAD (peripheral artery disease) ([place])     S/p bypass grafting, unclear exactly where  . Nephrolithiasis   . Hyperlipidemia 05/14/2011  . Hearing loss   . Complication of anesthesia     doesn't wake up good- "usually end up in ICU" also experiences post op nausea and vomiting  . Orthostatic hypotension     after surgery.  Marland Kitchen Heart murmur     PCP- [facility]- No tx needed.  . Foot drop, left   . H/O hiatal hernia     second one  . Diabetic neuropathy (HCC)     Diabetic neuropathy- has inproved since she quit smoking.  . Arthritis   . Depression   . Lung abnormality 05/11/2011  . Stroke ([place]) 05/2011    Weakness on Right. Wears leg brace.  . Iron deficiency anemia   . Chronic GI bleeding     presumed/notes 06/06/2015  . History of blood transfusion 06/2015   Past Surgical History  Procedure Laterality Date  . Total hip arthroplasty  2008    Right  . Abdominal hysterectomy  2003  . Cesarean section  1984  . Cholecystectomy  1984  . Bypass graft  2003    Abdominal aortic  . Kidney stone surgery  1999  . Appendectomy  2011  . Hernia repair  2011  . Lithotripsy    . Abdominal hysterectomy    .  Esophagogastroduodenoscopy N/A 05/09/2013    Procedure: ESOPHAGOGASTRODUODENOSCOPY (EGD);  Surgeon: Lafayette Dragon, MD;  Location: [facility] ENDOSCOPY;  Service: Endoscopy;  Laterality: N/A;  . Colonoscopy N/A 05/10/2013    Procedure: COLONOSCOPY;  Surgeon: Lafayette Dragon, MD;  Location: [facility] ENDOSCOPY;  Service: Endoscopy;  Laterality: N/A;  . Joint replacement    . Carotid stent insertion Right 2013    ICA/notes 06/06/2015   Social History:   reports that she has quit smoking. Her smoking use included Cigarettes. She smoked 0.00 packs per day for 45 years. She quit smokeless tobacco use about 4 years ago.  She reports that she does not drink alcohol or use illicit drugs.  Family History  Problem Relation Age of Onset  . Cancer Maternal Aunt     breast    Medications: Patient's Medications  New Prescriptions   No medications on file  Previous Medications   ACETAMINOPHEN (TYLENOL) 325 MG TABLET    Take 650 mg by mouth every 6 (six) hours as needed for pain.   ALBUTEROL (PROVENTIL) (2.5 MG/3ML) 0.083% NEBULIZER SOLUTION    Take 3 mLs (2.5 mg total) by nebulization every 4 (four) hours as needed for wheezing or shortness of breath.   AMINO ACIDS-PROTEIN HYDROLYS (FEEDING SUPPLEMENT, PRO-STAT SUGAR FREE 64,) LIQD    Take 30 mLs by mouth 3 (three) times daily with meals.   AMLODIPINE (NORVASC) 5 MG TABLET    Take 1 tablet (5 mg total) by mouth daily.   ASPIRIN 81 MG CHEWABLE TABLET    Chew 81 mg by mouth daily.   CHOLECALCIFEROL (VITAMIN D) 1000 UNITS TABLET    Take 1,000 Units by mouth daily.   DULOXETINE (CYMBALTA) 60 MG CAPSULE    Take 60 mg by mouth daily.   FENOFIBRATE (TRICOR) 48 MG TABLET    Take 48 mg by mouth daily.   FERROUS SULFATE 325 (65 FE) MG TABLET    Take 325 mg by mouth daily with breakfast.   GABAPENTIN (NEURONTIN) 100 MG CAPSULE    Take 1 capsule (100 mg total) by mouth at bedtime.   HYDRALAZINE (APRESOLINE) 50 MG TABLET    Take 50 mg by mouth 3 (three) times daily.   INSULIN ASPART (NOVOLOG) 100 UNIT/ML INJECTION    0-9 Units, Subcutaneous, 3 times daily with meals CBG < 70: implement hypoglycemia protocol CBG 70 - 120: 0 units CBG 121 - 150: 1 unit CBG 151 - 200: 2 units CBG 201 - 250: 3 units CBG 251 - 300: 5 units CBG 301 - 350: 7 units CBG 351 - 400: 9 units CBG > 400: call MD   INSULIN DETEMIR (LEVEMIR) 100 UNIT/ML INJECTION    Inject 12 Units into the skin at bedtime.   LORATADINE (CLARITIN) 10 MG TABLET    Take 10 mg by mouth daily.   LORAZEPAM (ATIVAN) 0.5 MG TABLET    Take 0.5 mg by mouth every 12 (twelve) hours as needed for anxiety.   MAGNESIUM HYDROXIDE  (MILK OF MAGNESIA) 800 MG/5ML SUSPENSION    Take 30 mLs by mouth daily as needed for constipation. 30cc   MENTHOL, TOPICAL ANALGESIC, (BIOFREEZE) 4 % GEL    To right ankle and bilateral knees TID   MULTIPLE VITAMINS-MINERALS (MULTI VITAMIN/MINERALS) TABS    Take 1 tablet by mouth daily.   NITROFURANTOIN (MACRODANTIN) 100 MG CAPSULE    Take 100 mg by mouth at bedtime. Stop date: 09/09/15   NYSTATIN CREAM (MYCOSTATIN)    Apply 1 application topically  3 (three) times daily as needed (rash, redness, or itching).    OMEGA-3 ACID ETHYL ESTERS (LOVAZA) 1 G CAPSULE    Take 2 g by mouth 2 (two) times daily.   OMEPRAZOLE (PRILOSEC) 20 MG CAPSULE    Take 20 mg by mouth 2 (two) times daily with a meal.   PRAVASTATIN (PRAVACHOL) 40 MG TABLET    Take 40 mg by mouth daily.    PROMETHAZINE (PHENERGAN) 25 MG TABLET    Take either by mouth, IM injection, or suppository every 6 hours PRN for nausea or vomiting.   PROPYLENE GLYCOL (SYSTANE BALANCE) 0.6 % SOLN    Place 1 drop into both eyes 2 (two) times daily.   SENNOSIDES-DOCUSATE SODIUM (SENOKOT-S) 8.6-50 MG TABLET    Take 2 tablets by mouth 2 (two) times daily.    SOLIFENACIN (VESICARE) 5 MG TABLET    Take 5 mg by mouth daily.   TRAZODONE (DESYREL) 50 MG TABLET    Take 25 mg by mouth at bedtime.   VITAMIN C (ASCORBIC ACID) 500 MG TABLET    Take 500 mg by mouth 2 (two) times daily.    WHEAT DEXTRIN (BENEFIBER) POWD    Take 1 scoop by mouth daily. Mix with 8oz of fluid  Modified Medications   No medications on file  Discontinued Medications   FERROUS SULFATE 325 (65 FE) MG TABLET    Take 1 tablet (325 mg total) by mouth 3 (three) times daily with meals.   LORAZEPAM (ATIVAN) 0.5 MG TABLET    Take 1/2 tablet by mouth every morning for anxiety and take one tablet by mouth at bedtime for rest     Physical Exam: Filed Vitals:   08/10/15 0923  BP: 117/61  Pulse: 90  Temp: 98.6 F (37 C)  TempSrc: Oral  Resp: 18  Height: 5\' 3"  (1.6 m)  Weight: 143 lb 9.6 oz  (65.137 kg)    Physical Exam  Constitutional: She is oriented to person, place, and time. She appears well-developed and well-nourished. No distress.  HENT:  Head: Normocephalic and atraumatic.  Neck: Normal range of motion. Neck supple.  Cardiovascular: Normal rate and regular rhythm.   Murmur heard. Pulmonary/Chest: Effort normal and breath sounds normal. No respiratory distress.  Abdominal: Soft. Bowel sounds are normal. She exhibits no distension. There is no tenderness.  Rounded abdomen   Genitourinary: Vagina normal. There is no rash, tenderness, lesion or injury on the right labia. There is no rash, tenderness, lesion or injury on the left labia.  Musculoskeletal: She exhibits no edema or tenderness.  Right sided hemiparesis   Lymphadenopathy:    She has no cervical adenopathy.  Neurological: She is alert and oriented to person, place, and time.  Skin: Skin is warm and dry. She is not diaphoretic.  Psychiatric: She has a normal mood and affect.    Labs reviewed: Basic Metabolic Panel:  Recent Labs  06/05/15 0510 06/06/15 0615 06/07/15 0918 06/20/15  NA 140 138 135 136*  K 4.3 4.3 3.9 4.2  CL 103 103 99*  --   CO2 25 25 23   --   GLUCOSE 97 114* 188*  --   BUN 29* 23* 21* 35*  CREATININE 1.62* 1.51* 1.47* 1.7*  CALCIUM 9.4 9.5 9.8  --    Liver Function Tests:  Recent Labs  06/04/15 1431 06/05/15 0510 06/06/15 0615  AST 18 19 20   ALT 14 14 13*  ALKPHOS 55 51 50  BILITOT 0.4 0.3 0.4  PROT  7.3 6.9 6.6  ALBUMIN 3.4* 3.1* 2.9*   No results for input(s): LIPASE, AMYLASE in the last 8760 hours. No results for input(s): AMMONIA in the last 8760 hours. CBC:  Recent Labs  06/04/15 1151  06/06/15 0615 06/07/15 0918  07/02/15 07/03/15 07/19/15 1157  WBC 7.3  < > 10.6* 7.7  < > 9.7 5.3 8.3  NEUTROABS 5.5  --   --   --   --   --   --  5.8  HGB 8.7*  < > 7.3* 10.3*  < > 8.6* 8.0* 8.4*  HCT 30.8*  < > 23.9* 32.5*  < > 28* 26* 26.9*  MCV 77.4*  < > 76.6* 76.5*   --   --   --  80.5  PLT 307  < > 289 340  < > 343 218 256  < > = values in this interval not displayed. TSH: No results for input(s): TSH in the last 8760 hours. A1C: Lab Results  Component Value Date   HGBA1C 5.3 02/08/2015   Lipid Panel:  Recent Labs  11/03/14 02/08/15  CHOL 146 143  HDL 35 39  LDLCALC 59 46  TRIG 259* 291*    Assessment/Plan 1. DM (diabetes mellitus), type 2 with neurological complications (HCC) -blood sugars ranging from 120s- 270s. conts on levemir with SSI -will follow up A1c and bmp  2. Overactive bladder -stable on vesicare  3. Urinary tract infection without hematuria, site unspecified Currently on bactrim, will then start macrobid 100 mg daily for 30 days. Symptoms remain stable.   4. Anxiety and depression Remains stable, pt reports positive mood. conts on cymbalta  5. Cerebral artery occlusion with cerebral infarction ([place]) -stable right hemiplegia, conts on ASA   6. Essential hypertension -blood pressure remains stable. Remains on apresoline and norvasc  7. Iron deficiency anemia due to chronic blood loss -following with hematology for routine iron infusion. No new symptoms.   Carlos American. Harle Battiest  [facility] [phone] 8 am - 5 pm) [phone] (after hours)

## 2015-08-13 DIAGNOSIS — E1140 Type 2 diabetes mellitus with diabetic neuropathy, unspecified: Secondary | ICD-10-CM | POA: Diagnosis not present

## 2015-08-14 LAB — HEMOGLOBIN A1C: Hemoglobin A1C: 6.8

## 2015-08-14 LAB — BASIC METABOLIC PANEL
BUN: 42 mg/dL — AB (ref 4–21)
Creatinine: 1.7 mg/dL — AB (ref 0.5–1.1)
GLUCOSE: 171 mg/dL
POTASSIUM: 4.9 mmol/L (ref 3.4–5.3)
Sodium: 132 mmol/L — AB (ref 137–147)

## 2015-08-15 ENCOUNTER — Non-Acute Institutional Stay (SKILLED_NURSING_FACILITY): Payer: Medicare Other | Admitting: Nurse Practitioner

## 2015-08-15 ENCOUNTER — Encounter: Payer: Self-pay | Admitting: Nurse Practitioner

## 2015-08-15 DIAGNOSIS — E871 Hypo-osmolality and hyponatremia: Secondary | ICD-10-CM

## 2015-08-15 DIAGNOSIS — N183 Chronic kidney disease, stage 3 unspecified: Secondary | ICD-10-CM

## 2015-08-15 DIAGNOSIS — D500 Iron deficiency anemia secondary to blood loss (chronic): Secondary | ICD-10-CM

## 2015-08-15 NOTE — Progress Notes (Signed)
Patient ID: KRISTELLE MENGHINI, female   DOB: [birth year], 65 y.o.   MRN: [id number]    Nursing Home Location:  [place] of Service: SNF (31)  PCP: [name], DO  Allergies  Allergen Reactions  . Codeine Other (See Comments)    Per Mar  . Duloxetine Hcl   . Flexeril [Cyclobenzaprine Hcl] Other (See Comments)    Per Mar  . Lyrica [Pregabalin] Other (See Comments)    "wires my up"    Chief Complaint  Patient presents with  . Acute Visit    Abnormal labs    HPI:  Patient is a 65 y.o. female seen today at [facility] for abnormal labs. Pt with a hx of CVA with spastic hemiplegia, constipation, anxiety, DM, HTN, and hyperlipidemia. pts labs came back with Na of 132 and Cr of 1.68.  Pt has been drinking water and fluids but not in excess. Not taking NSAIDs. A lot of medications were removed last hospitalization. Pt reports most recent UA was normal from urologist. She conts on macrobid daily. No increase in urination or thirst noted.   Review of Systems:  Review of Systems  Constitutional: Negative for activity change, appetite change, fatigue and unexpected weight change.  HENT: Negative for congestion, hearing loss and sinus pressure.   Eyes: Negative.   Respiratory: Negative for cough and shortness of breath.   Cardiovascular: Negative for chest pain, palpitations and leg swelling.  Gastrointestinal: Positive for constipation (controlled on medications). Negative for abdominal pain and diarrhea.  Genitourinary: Positive for frequency (vesicare helps). Negative for dysuria, vaginal discharge, difficulty urinating and vaginal pain.  Musculoskeletal: Negative for arthralgias.  Skin: Negative for color change and wound.  Neurological: Negative for dizziness and weakness. Numbness: right side.  Psychiatric/Behavioral: Negative for behavioral problems and confusion. The patient is nervous/anxious (well controlled on current regimen).      Past Medical History  Diagnosis Date  . Diabetes mellitus   . Hypertension   . PAD (peripheral artery disease) ([place])     S/p bypass grafting, unclear exactly where  . Nephrolithiasis   . Hyperlipidemia 05/14/2011  . Hearing loss   . Complication of anesthesia     doesn't wake up good- "usually end up in ICU" also experiences post op nausea and vomiting  . Orthostatic hypotension     after surgery.  Marland Kitchen Heart murmur     PCP- [facility]- No tx needed.  . Foot drop, left   . H/O hiatal hernia     second one  . Diabetic neuropathy (HCC)     Diabetic neuropathy- has inproved since she quit smoking.  . Arthritis   . Depression   . Lung abnormality 05/11/2011  . Stroke ([place]) 05/2011    Weakness on Right. Wears leg brace.  . Iron deficiency anemia   . Chronic GI bleeding     presumed/notes 06/06/2015  . History of blood transfusion 06/2015   Past Surgical History  Procedure Laterality Date  . Total hip arthroplasty  2008    Right  . Abdominal hysterectomy  2003  . Cesarean section  1984  . Cholecystectomy  1984  . Bypass graft  2003    Abdominal aortic  . Kidney stone surgery  1999  . Appendectomy  2011  . Hernia repair  2011  . Lithotripsy    . Abdominal hysterectomy    . Esophagogastroduodenoscopy N/A 05/09/2013    Procedure: ESOPHAGOGASTRODUODENOSCOPY (EGD);  Surgeon: Sydell Axon  Andris Baumann, MD;  Location: [place];  Service: Endoscopy;  Laterality: N/A;  . Colonoscopy N/A 05/10/2013    Procedure: COLONOSCOPY;  Surgeon: Lafayette Dragon, MD;  Location: [facility] ENDOSCOPY;  Service: Endoscopy;  Laterality: N/A;  . Joint replacement    . Carotid stent insertion Right 2013    ICA/notes 06/06/2015   Social History:   reports that she has quit smoking. Her smoking use included Cigarettes. She smoked 0.00 packs per day for 45 years. She quit smokeless tobacco use about 4 years ago. She reports that she does not drink alcohol or use illicit drugs.  Family History  Problem Relation  Age of Onset  . Cancer Maternal Aunt     breast    Medications: Patient's Medications  New Prescriptions   No medications on file  Previous Medications   ACETAMINOPHEN (TYLENOL) 325 MG TABLET    Take 650 mg by mouth every 6 (six) hours as needed for pain.   ALBUTEROL (PROVENTIL) (2.5 MG/3ML) 0.083% NEBULIZER SOLUTION    Take 3 mLs (2.5 mg total) by nebulization every 4 (four) hours as needed for wheezing or shortness of breath.   AMINO ACIDS-PROTEIN HYDROLYS (FEEDING SUPPLEMENT, PRO-STAT SUGAR FREE 64,) LIQD    Take 30 mLs by mouth 3 (three) times daily with meals.   AMLODIPINE (NORVASC) 5 MG TABLET    Take 1 tablet (5 mg total) by mouth daily.   ASPIRIN 81 MG CHEWABLE TABLET    Chew 81 mg by mouth daily.   CHOLECALCIFEROL (VITAMIN D) 1000 UNITS TABLET    Take 1,000 Units by mouth daily.   DULOXETINE (CYMBALTA) 60 MG CAPSULE    Take 60 mg by mouth daily.   FENOFIBRATE (TRICOR) 48 MG TABLET    Take 48 mg by mouth daily.   FERROUS SULFATE 325 (65 FE) MG TABLET    Take 325 mg by mouth daily with breakfast.   GABAPENTIN (NEURONTIN) 100 MG CAPSULE    Take 1 capsule (100 mg total) by mouth at bedtime.   HYDRALAZINE (APRESOLINE) 50 MG TABLET    Take 50 mg by mouth 3 (three) times daily.   INSULIN ASPART (NOVOLOG) 100 UNIT/ML INJECTION    0-9 Units, Subcutaneous, 3 times daily with meals CBG < 70: implement hypoglycemia protocol CBG 70 - 120: 0 units CBG 121 - 150: 1 unit CBG 151 - 200: 2 units CBG 201 - 250: 3 units CBG 251 - 300: 5 units CBG 301 - 350: 7 units CBG 351 - 400: 9 units CBG > 400: call MD   INSULIN DETEMIR (LEVEMIR) 100 UNIT/ML INJECTION    Inject 12 Units into the skin at bedtime.   LORATADINE (CLARITIN) 10 MG TABLET    Take 10 mg by mouth daily.   LORAZEPAM (ATIVAN) 0.5 MG TABLET    Take 0.5 mg by mouth every 12 (twelve) hours as needed for anxiety.   MAGNESIUM HYDROXIDE (MILK OF MAGNESIA) 800 MG/5ML SUSPENSION    Take 30 mLs by mouth daily as needed for constipation. 30cc    MENTHOL, TOPICAL ANALGESIC, (BIOFREEZE) 4 % GEL    To right ankle and bilateral knees TID   MULTIPLE VITAMINS-MINERALS (MULTI VITAMIN/MINERALS) TABS    Take 1 tablet by mouth daily.   NITROFURANTOIN (MACRODANTIN) 100 MG CAPSULE    Take 100 mg by mouth at bedtime. Stop date: 09/09/15   NYSTATIN CREAM (MYCOSTATIN)    Apply 1 application topically 3 (three) times daily as needed (rash, redness, or itching).  OMEGA-3 ACID ETHYL ESTERS (LOVAZA) 1 G CAPSULE    Take 2 g by mouth 2 (two) times daily.   OMEPRAZOLE (PRILOSEC) 20 MG CAPSULE    Take 20 mg by mouth 2 (two) times daily with a meal.   PRAVASTATIN (PRAVACHOL) 40 MG TABLET    Take 40 mg by mouth daily.    PROMETHAZINE (PHENERGAN) 25 MG TABLET    Take either by mouth, IM injection, or suppository every 6 hours PRN for nausea or vomiting.   PROPYLENE GLYCOL (SYSTANE BALANCE) 0.6 % SOLN    Place 1 drop into both eyes 2 (two) times daily.   SENNOSIDES-DOCUSATE SODIUM (SENOKOT-S) 8.6-50 MG TABLET    Take 2 tablets by mouth 2 (two) times daily.    SOLIFENACIN (VESICARE) 5 MG TABLET    Take 5 mg by mouth daily.   TRAZODONE (DESYREL) 50 MG TABLET    Take 25 mg by mouth at bedtime.   VITAMIN C (ASCORBIC ACID) 500 MG TABLET    Take 500 mg by mouth 2 (two) times daily.    WHEAT DEXTRIN (BENEFIBER) POWD    Take 1 scoop by mouth daily. Mix with 8oz of fluid  Modified Medications   No medications on file  Discontinued Medications   No medications on file     Physical Exam: Filed Vitals:   08/15/15 1041  BP: 117/82  Pulse: 94  Temp: 99.1 F (37.3 C)  TempSrc: Oral  Resp: 18  Height: 5\' 3"  (1.6 m)  Weight: 143 lb 9.6 oz (65.137 kg)    Physical Exam  Constitutional: She is oriented to person, place, and time. She appears well-developed and well-nourished. No distress.  HENT:  Head: Normocephalic and atraumatic.  Neck: Normal range of motion. Neck supple.  Cardiovascular: Normal rate and regular rhythm.   Murmur heard. Pulmonary/Chest:  Effort normal and breath sounds normal. No respiratory distress.  Abdominal: Soft. Bowel sounds are normal. She exhibits no distension. There is no tenderness.  Rounded abdomen   Genitourinary: Vagina normal. There is no rash, tenderness, lesion or injury on the right labia. There is no rash, tenderness, lesion or injury on the left labia.  Musculoskeletal: She exhibits no edema or tenderness.  Right sided hemiparesis   Lymphadenopathy:    She has no cervical adenopathy.  Neurological: She is alert and oriented to person, place, and time.  Skin: Skin is warm and dry. She is not diaphoretic.  Psychiatric: She has a normal mood and affect.    Labs reviewed: Basic Metabolic Panel:  Recent Labs  06/05/15 0510 06/06/15 0615 06/07/15 0918 06/20/15 08/14/15  NA 140 138 135 136* 132*  K 4.3 4.3 3.9 4.2 4.9  CL 103 103 99*  --   --   CO2 25 25 23   --   --   GLUCOSE 97 114* 188*  --   --   BUN 29* 23* 21* 35* 42*  CREATININE 1.62* 1.51* 1.47* 1.7* 1.7*  CALCIUM 9.4 9.5 9.8  --   --    Liver Function Tests:  Recent Labs  06/04/15 1431 06/05/15 0510 06/06/15 0615  AST 18 19 20   ALT 14 14 13*  ALKPHOS 55 51 50  BILITOT 0.4 0.3 0.4  PROT 7.3 6.9 6.6  ALBUMIN 3.4* 3.1* 2.9*   No results for input(s): LIPASE, AMYLASE in the last 8760 hours. No results for input(s): AMMONIA in the last 8760 hours. CBC:  Recent Labs  06/04/15 1151  06/06/15 0615 06/07/15 [id number]  07/02/15  07/03/15 07/19/15 1157  WBC 7.3  < > 10.6* 7.7  < > 9.7 5.3 8.3  NEUTROABS 5.5  --   --   --   --   --   --  5.8  HGB 8.7*  < > 7.3* 10.3*  < > 8.6* 8.0* 8.4*  HCT 30.8*  < > 23.9* 32.5*  < > 28* 26* 26.9*  MCV 77.4*  < > 76.6* 76.5*  --   --   --  80.5  PLT 307  < > 289 340  < > 343 218 256  < > = values in this interval not displayed. TSH: No results for input(s): TSH in the last 8760 hours. A1C: Lab Results  Component Value Date   HGBA1C 6.8 08/14/2015   Lipid Panel:  Recent Labs  11/03/14  02/08/15  CHOL 146 143  HDL 35 39  LDLCALC 59 46  TRIG 259* 291*    Assessment/Plan 1. CKD (chronic kidney disease) stage 3, GFR 30-59 ml/min Elevated BUN and Cr since hospitalization.  Stable compaired to previous labs. Discussed with pt. Medications reviewed. To avoid dehydration and NSAIDS  2. Iron deficiency anemia due to chronic blood loss -to cont iron supplements, will follow up CBC with next blood draw  3. Hyponatremia Mildly low sodium. Will get lab next week to follow up.    Carlos American. Harle Battiest  [facility] & Adult Medicine [phone] 8 am - 5 pm) [phone] (after hours)

## 2015-08-16 ENCOUNTER — Ambulatory Visit (HOSPITAL_COMMUNITY)
Admission: RE | Admit: 2015-08-16 | Discharge: 2015-08-16 | Disposition: A | Discharge status: Home / Self Care | Payer: Medicare Other | Source: Ambulatory Visit | Attending: Interventional Radiology | Admitting: Interventional Radiology

## 2015-08-16 DIAGNOSIS — R531 Weakness: Secondary | ICD-10-CM | POA: Diagnosis not present

## 2015-08-16 DIAGNOSIS — I6522 Occlusion and stenosis of left carotid artery: Secondary | ICD-10-CM | POA: Diagnosis not present

## 2015-08-16 DIAGNOSIS — I771 Stricture of artery: Secondary | ICD-10-CM

## 2015-08-20 DIAGNOSIS — D500 Iron deficiency anemia secondary to blood loss (chronic): Secondary | ICD-10-CM | POA: Diagnosis not present

## 2015-08-20 DIAGNOSIS — I10 Essential (primary) hypertension: Secondary | ICD-10-CM | POA: Diagnosis not present

## 2015-08-21 ENCOUNTER — Other Ambulatory Visit (HOSPITAL_COMMUNITY): Payer: Self-pay | Admitting: Interventional Radiology

## 2015-08-21 ENCOUNTER — Telehealth (HOSPITAL_COMMUNITY): Payer: Self-pay

## 2015-08-21 DIAGNOSIS — I771 Stricture of artery: Secondary | ICD-10-CM

## 2015-08-21 LAB — CBC AND DIFFERENTIAL
HCT: 30 % — AB (ref 36–46)
Hemoglobin: 9.6 g/dL — AB (ref 12.0–16.0)
Platelets: 210 10*3/uL (ref 150–399)
WBC: 7.3 10^3/mL

## 2015-08-21 NOTE — Telephone Encounter (Signed)
Called to schedule CT, left message for pt to call back. AW 

## 2015-08-22 DIAGNOSIS — D500 Iron deficiency anemia secondary to blood loss (chronic): Secondary | ICD-10-CM | POA: Diagnosis not present

## 2015-08-22 DIAGNOSIS — E1140 Type 2 diabetes mellitus with diabetic neuropathy, unspecified: Secondary | ICD-10-CM | POA: Diagnosis not present

## 2015-08-22 DIAGNOSIS — I10 Essential (primary) hypertension: Secondary | ICD-10-CM | POA: Diagnosis not present

## 2015-08-22 LAB — BASIC METABOLIC PANEL
BUN: 36 mg/dL — AB (ref 4–21)
CREATININE: 1.7 mg/dL — AB (ref 0.5–1.1)
GLUCOSE: 274 mg/dL
POTASSIUM: 4.7 mmol/L (ref 3.4–5.3)
Sodium: 132 mmol/L — AB (ref 137–147)

## 2015-08-31 ENCOUNTER — Ambulatory Visit (HOSPITAL_COMMUNITY)
Admission: RE | Admit: 2015-08-31 | Discharge: 2015-08-31 | Disposition: A | Discharge status: Home / Self Care | Payer: Medicare Other | Source: Ambulatory Visit | Attending: Interventional Radiology | Admitting: Interventional Radiology

## 2015-08-31 ENCOUNTER — Other Ambulatory Visit (HOSPITAL_COMMUNITY): Payer: Self-pay | Admitting: Interventional Radiology

## 2015-08-31 ENCOUNTER — Ambulatory Visit (HOSPITAL_COMMUNITY): Payer: Medicare Other

## 2015-08-31 DIAGNOSIS — I6522 Occlusion and stenosis of left carotid artery: Secondary | ICD-10-CM | POA: Diagnosis not present

## 2015-08-31 DIAGNOSIS — I771 Stricture of artery: Secondary | ICD-10-CM | POA: Diagnosis not present

## 2015-08-31 DIAGNOSIS — E049 Nontoxic goiter, unspecified: Secondary | ICD-10-CM | POA: Diagnosis not present

## 2015-08-31 DIAGNOSIS — I6503 Occlusion and stenosis of bilateral vertebral arteries: Secondary | ICD-10-CM | POA: Diagnosis not present

## 2015-08-31 DIAGNOSIS — I6502 Occlusion and stenosis of left vertebral artery: Secondary | ICD-10-CM | POA: Diagnosis not present

## 2015-08-31 MED ORDER — IOPAMIDOL (ISOVUE-370) INJECTION 76%
INTRAVENOUS | Status: AC
Start: 2015-08-31 — End: 2015-08-31
  Administered 2015-08-31: 50 mL
  Filled 2015-08-31: qty 50

## 2015-09-03 ENCOUNTER — Ambulatory Visit (HOSPITAL_COMMUNITY)
Admission: RE | Admit: 2015-09-03 | Discharge: 2015-09-03 | Disposition: A | Discharge status: Home / Self Care | Payer: Medicare Other | Source: Ambulatory Visit | Attending: Interventional Radiology | Admitting: Interventional Radiology

## 2015-09-03 DIAGNOSIS — I771 Stricture of artery: Secondary | ICD-10-CM

## 2015-09-03 DIAGNOSIS — R531 Weakness: Secondary | ICD-10-CM | POA: Diagnosis not present

## 2015-09-03 DIAGNOSIS — I6522 Occlusion and stenosis of left carotid artery: Secondary | ICD-10-CM | POA: Diagnosis not present

## 2015-09-06 ENCOUNTER — Other Ambulatory Visit: Payer: Self-pay | Admitting: *Deleted

## 2015-09-06 MED ORDER — LORAZEPAM 0.5 MG PO TABS: ORAL_TABLET | ORAL | Status: DC

## 2015-09-11 DIAGNOSIS — G8191 Hemiplegia, unspecified affecting right dominant side: Secondary | ICD-10-CM | POA: Diagnosis not present

## 2015-09-11 DIAGNOSIS — M24541 Contracture, right hand: Secondary | ICD-10-CM | POA: Diagnosis not present

## 2015-09-12 ENCOUNTER — Non-Acute Institutional Stay (SKILLED_NURSING_FACILITY): Payer: Medicare Other | Admitting: Nurse Practitioner

## 2015-09-12 DIAGNOSIS — K5900 Constipation, unspecified: Secondary | ICD-10-CM | POA: Diagnosis not present

## 2015-09-12 DIAGNOSIS — I10 Essential (primary) hypertension: Secondary | ICD-10-CM | POA: Diagnosis not present

## 2015-09-12 DIAGNOSIS — E1149 Type 2 diabetes mellitus with other diabetic neurological complication: Secondary | ICD-10-CM | POA: Diagnosis not present

## 2015-09-12 DIAGNOSIS — G8110 Spastic hemiplegia affecting unspecified side: Secondary | ICD-10-CM | POA: Diagnosis not present

## 2015-09-12 DIAGNOSIS — I6529 Occlusion and stenosis of unspecified carotid artery: Secondary | ICD-10-CM

## 2015-09-12 DIAGNOSIS — D500 Iron deficiency anemia secondary to blood loss (chronic): Secondary | ICD-10-CM | POA: Diagnosis not present

## 2015-09-12 DIAGNOSIS — M24541 Contracture, right hand: Secondary | ICD-10-CM | POA: Diagnosis not present

## 2015-09-12 DIAGNOSIS — K5909 Other constipation: Secondary | ICD-10-CM

## 2015-09-12 DIAGNOSIS — G8191 Hemiplegia, unspecified affecting right dominant side: Secondary | ICD-10-CM | POA: Diagnosis not present

## 2015-09-12 NOTE — Progress Notes (Signed)
Patient ID: Chelsey Gallagher, female   DOB: 1950-09-16, 65 y.o.   MRN: [id number]    Nursing Home Location:  [place] of Service: SNF (31)  PCP: [name], DO  Allergies  Allergen Reactions  . Codeine Other (See Comments)    Per Mar  . Duloxetine Hcl   . Flexeril [Cyclobenzaprine Hcl] Other (See Comments)    Per Mar  . Lyrica [Pregabalin] Other (See Comments)    "wires my up"    Chief Complaint  Patient presents with  . Medical Management of Chronic Issues    Routine Visit    HPI:  Patient is a 65 y.o. female seen today at [facility] for routine follow up on chronic conditions. Pt with a hx of CVA with spastic hemiplegia, constipation, anxiety, DM, HTN, and hyperlipidemia. Pt reports increase blood sugars, pt reports there has been no changes in diet but blood sugars remain elevated. Metformin was stopped at most recent hospitalization due to AKI. Pt currently on levemir with SSI Pt also reports increase in leg pain. Tight and cramping bad at night, leg will draw up. Pt was taken off all muscle relaxers during last hospitalization.   Review of Systems:  Review of Systems  Constitutional: Negative for activity change, appetite change, fatigue and unexpected weight change.  HENT: Negative for congestion, hearing loss and sinus pressure.   Eyes: Negative.   Respiratory: Negative for cough and shortness of breath.   Cardiovascular: Negative for chest pain, palpitations and leg swelling.  Gastrointestinal: Positive for constipation (controlled on medications). Negative for abdominal pain and diarrhea.  Genitourinary: Positive for frequency (vesicare helps). Negative for dysuria, vaginal discharge, difficulty urinating and vaginal pain.  Musculoskeletal: Negative for arthralgias.  Skin: Negative for color change and wound.  Neurological: Negative for dizziness and weakness. Numbness: right side.  Psychiatric/Behavioral: Negative for behavioral problems  and confusion. The patient is nervous/anxious (well controlled on current regimen).     Past Medical History  Diagnosis Date  . Diabetes mellitus   . Hypertension   . PAD (peripheral artery disease) ([place])     S/p bypass grafting, unclear exactly where  . Nephrolithiasis   . Hyperlipidemia 05/14/2011  . Hearing loss   . Complication of anesthesia     doesn't wake up good- "usually end up in ICU" also experiences post op nausea and vomiting  . Orthostatic hypotension     after surgery.  Marland Kitchen Heart murmur     PCP- [facility]- No tx needed.  . Foot drop, left   . H/O hiatal hernia     second one  . Diabetic neuropathy (HCC)     Diabetic neuropathy- has inproved since she quit smoking.  . Arthritis   . Depression   . Lung abnormality 05/11/2011  . Stroke ([place]) 05/2011    Weakness on Right. Wears leg brace.  . Iron deficiency anemia   . Chronic GI bleeding     presumed/notes 06/06/2015  . History of blood transfusion 06/2015   Past Surgical History  Procedure Laterality Date  . Total hip arthroplasty  2008    Right  . Abdominal hysterectomy  2003  . Cesarean section  1984  . Cholecystectomy  1984  . Bypass graft  2003    Abdominal aortic  . Kidney stone surgery  1999  . Appendectomy  2011  . Hernia repair  2011  . Lithotripsy    . Abdominal hysterectomy    . Esophagogastroduodenoscopy N/A 05/09/2013  Procedure: ESOPHAGOGASTRODUODENOSCOPY (EGD);  Surgeon: Lafayette Dragon, MD;  Location: [facility] ENDOSCOPY;  Service: Endoscopy;  Laterality: N/A;  . Colonoscopy N/A 05/10/2013    Procedure: COLONOSCOPY;  Surgeon: Lafayette Dragon, MD;  Location: [facility] ENDOSCOPY;  Service: Endoscopy;  Laterality: N/A;  . Joint replacement    . Carotid stent insertion Right 2013    ICA/notes 06/06/2015   Social History:   reports that she has quit smoking. Her smoking use included Cigarettes. She smoked 0.00 packs per day for 45 years. She quit smokeless tobacco use about 4 years ago. She reports that  she does not drink alcohol or use illicit drugs.  Family History  Problem Relation Age of Onset  . Cancer Maternal Aunt     breast    Medications: Patient's Medications  New Prescriptions   No medications on file  Previous Medications   ACETAMINOPHEN (TYLENOL) 325 MG TABLET    Take 650 mg by mouth every 6 (six) hours as needed for pain.   ALBUTEROL (PROVENTIL) (2.5 MG/3ML) 0.083% NEBULIZER SOLUTION    Take 3 mLs (2.5 mg total) by nebulization every 4 (four) hours as needed for wheezing or shortness of breath.   AMINO ACIDS-PROTEIN HYDROLYS (FEEDING SUPPLEMENT, PRO-STAT SUGAR FREE 64,) LIQD    Take 30 mLs by mouth 3 (three) times daily with meals.   AMLODIPINE (NORVASC) 5 MG TABLET    Take 1 tablet (5 mg total) by mouth daily.   ASPIRIN 81 MG CHEWABLE TABLET    Chew 81 mg by mouth daily.   BISACODYL (DULCOLAX) 10 MG SUPPOSITORY    Place 10 mg rectally daily as needed for moderate constipation.   CHOLECALCIFEROL (VITAMIN D) 1000 UNITS TABLET    Take 1,000 Units by mouth daily.   CLOPIDOGREL (PLAVIX) 75 MG TABLET    Take 75 mg by mouth at bedtime.   DULOXETINE (CYMBALTA) 60 MG CAPSULE    Take 60 mg by mouth daily.   FENOFIBRATE (TRICOR) 48 MG TABLET    Take 48 mg by mouth daily.   FERROUS SULFATE 325 (65 FE) MG TABLET    Take 325 mg by mouth daily with breakfast.   GABAPENTIN (NEURONTIN) 100 MG CAPSULE    Take 1 capsule (100 mg total) by mouth at bedtime.   HYDRALAZINE (APRESOLINE) 50 MG TABLET    Take 50 mg by mouth 3 (three) times daily.   INSULIN ASPART (NOVOLOG) 100 UNIT/ML INJECTION    0-9 Units, Subcutaneous, 3 times daily with meals CBG < 70: implement hypoglycemia protocol CBG 70 - 120: 0 units CBG 121 - 150: 1 unit CBG 151 - 200: 2 units CBG 201 - 250: 3 units CBG 251 - 300: 5 units CBG 301 - 350: 7 units CBG 351 - 400: 9 units CBG > 400: call MD   INSULIN DETEMIR (LEVEMIR) 100 UNIT/ML INJECTION    Inject 12 Units into the skin at bedtime.   LORATADINE (CLARITIN) 10 MG  TABLET    Take 10 mg by mouth daily.   LORAZEPAM (ATIVAN) 0.5 MG TABLET    Take 1/2 tablet(0.25 mg) by mouth every morning for anxiety(control). Take 1 tablet by mouth at bedtime for rest(control).   MAGNESIUM HYDROXIDE (MILK OF MAGNESIA) 800 MG/5ML SUSPENSION    Take 30 mLs by mouth daily as needed for constipation. 30cc   MENTHOL, TOPICAL ANALGESIC, (BIOFREEZE) 4 % GEL    To right ankle and bilateral knees TID   MULTIPLE VITAMINS-MINERALS (MULTI VITAMIN/MINERALS) TABS    Take  1 tablet by mouth daily.   NITROFURANTOIN (MACRODANTIN) 100 MG CAPSULE    Take 100 mg by mouth at bedtime.   NYSTATIN CREAM (MYCOSTATIN)    Apply 1 application topically 3 (three) times daily as needed (rash, redness, or itching).    OMEGA-3 ACID ETHYL ESTERS (LOVAZA) 1 G CAPSULE    Take 2 g by mouth 2 (two) times daily.   OMEPRAZOLE (PRILOSEC) 20 MG CAPSULE    Take 20 mg by mouth 2 (two) times daily with a meal.   PRAVASTATIN (PRAVACHOL) 40 MG TABLET    Take 40 mg by mouth daily.    PROMETHAZINE (PHENERGAN) 25 MG TABLET    Take either by mouth, IM injection, or suppository every 6 hours PRN for nausea or vomiting.   PROPYLENE GLYCOL (SYSTANE BALANCE) 0.6 % SOLN    Place 1 drop into both eyes 2 (two) times daily.   SENNOSIDES-DOCUSATE SODIUM (SENOKOT-S) 8.6-50 MG TABLET    Take 2 tablets by mouth 2 (two) times daily.    SOLIFENACIN (VESICARE) 5 MG TABLET    Take 5 mg by mouth daily.   TRAZODONE (DESYREL) 50 MG TABLET    Take 25 mg by mouth at bedtime.   VITAMIN C (ASCORBIC ACID) 500 MG TABLET    Take 500 mg by mouth 2 (two) times daily.    WHEAT DEXTRIN (BENEFIBER) POWD    Take 1 scoop by mouth daily. Mix with 8oz of fluid  Modified Medications   No medications on file  Discontinued Medications   NITROFURANTOIN (MACRODANTIN) 100 MG CAPSULE    Take 100 mg by mouth at bedtime. Stop date: 09/09/15     Physical Exam: Filed Vitals:   09/12/15 1341  BP: 132/78  Pulse: 95  Temp: 97.1 F (36.2 C)  TempSrc: Oral  Resp: 20   Height: 5\' 3"  (1.6 m)  Weight: 143 lb (64.864 kg)    Physical Exam  Constitutional: She is oriented to person, place, and time. She appears well-developed and well-nourished. No distress.  HENT:  Head: Normocephalic and atraumatic.  Neck: Normal range of motion. Neck supple.  Cardiovascular: Normal rate and regular rhythm.   Murmur heard. Pulmonary/Chest: Effort normal and breath sounds normal. No respiratory distress.  Abdominal: Soft. Bowel sounds are normal. She exhibits no distension. There is no tenderness.  Rounded abdomen   Genitourinary: Vagina normal. There is no rash, tenderness, lesion or injury on the right labia. There is no rash, tenderness, lesion or injury on the left labia.  Musculoskeletal: She exhibits no edema or tenderness.  Right sided hemiparesis   Lymphadenopathy:    She has no cervical adenopathy.  Neurological: She is alert and oriented to person, place, and time.  Skin: Skin is warm and dry. She is not diaphoretic.  Psychiatric: She has a normal mood and affect.    Labs reviewed: Basic Metabolic Panel:  Recent Labs  06/05/15 0510 06/06/15 0615 06/07/15 0918 06/20/15 08/14/15 08/22/15  NA 140 138 135 136* 132* 132*  K 4.3 4.3 3.9 4.2 4.9 4.7  CL 103 103 99*  --   --   --   CO2 25 25 23   --   --   --   GLUCOSE 97 114* 188*  --   --   --   BUN 29* 23* 21* 35* 42* 36*  CREATININE 1.62* 1.51* 1.47* 1.7* 1.7* 1.7*  CALCIUM 9.4 9.5 9.8  --   --   --    Liver Function Tests:  Recent Labs  06/04/15 1431 06/05/15 0510 06/06/15 0615  AST 18 19 20   ALT 14 14 13*  ALKPHOS 55 51 50  BILITOT 0.4 0.3 0.4  PROT 7.3 6.9 6.6  ALBUMIN 3.4* 3.1* 2.9*   No results for input(s): LIPASE, AMYLASE in the last 8760 hours. No results for input(s): AMMONIA in the last 8760 hours. CBC:  Recent Labs  06/04/15 1151  06/06/15 0615 06/07/15 0918  07/03/15 07/19/15 1157 08/21/15  WBC 7.3  < > 10.6* 7.7  < > 5.3 8.3 7.3  NEUTROABS 5.5  --   --   --   --   --   5.8  --   HGB 8.7*  < > 7.3* 10.3*  < > 8.0* 8.4* 9.6*  HCT 30.8*  < > 23.9* 32.5*  < > 26* 26.9* 30*  MCV 77.4*  < > 76.6* 76.5*  --   --  80.5  --   PLT 307  < > 289 340  < > 218 256 210  < > = values in this interval not displayed. TSH: No results for input(s): TSH in the last 8760 hours. A1C: Lab Results  Component Value Date   HGBA1C 6.8 08/14/2015   Lipid Panel:  Recent Labs  11/03/14 02/08/15  CHOL 146 143  HDL 35 39  LDLCALC 59 46  TRIG 259* 291*    Assessment/Plan 1. DM (diabetes mellitus), type 2 with neurological complications (HCC) Blood sugars elevated, will increase levemir to 15 units daily -will dc SS novolog and start novolog 5 units with meals  -pt sees ophthalmology that comes to facility, needs eye exam -also to see podiatrist for diabetic foot exam -will get urine micro albumin -follow up BMP  2. Essential hypertension -blood pressure stable, cont on current medications   3. Carotid stenosis, unspecified laterality Following with IR regarding stenosis, plan for diagnostic catheter angiogram with view to endovascular revascularization of the left internal carotid artery with stent   4. Spastic hemiplegia affecting dominant side (HCC) Pt has been off of all muscle relaxer since prior hospitalization, recently with increase cramping and tightening to right side and left upper thigh which have been very painful. Will restart baclofen 5 mg TID at this time  5. Iron deficiency anemia due to chronic blood loss Following with hematology for iron infusions   6. Chronic constipation Remains stable on current medications    Avyanna Spada K. Harle Battiest  [facility] [phone] 8 am - 5 pm) [phone] (after hours)

## 2015-09-13 DIAGNOSIS — G8191 Hemiplegia, unspecified affecting right dominant side: Secondary | ICD-10-CM | POA: Diagnosis not present

## 2015-09-13 DIAGNOSIS — Z79899 Other long term (current) drug therapy: Secondary | ICD-10-CM | POA: Diagnosis not present

## 2015-09-13 DIAGNOSIS — M24541 Contracture, right hand: Secondary | ICD-10-CM | POA: Diagnosis not present

## 2015-09-13 DIAGNOSIS — E1165 Type 2 diabetes mellitus with hyperglycemia: Secondary | ICD-10-CM | POA: Diagnosis not present

## 2015-09-14 DIAGNOSIS — G8191 Hemiplegia, unspecified affecting right dominant side: Secondary | ICD-10-CM | POA: Diagnosis not present

## 2015-09-14 DIAGNOSIS — M24541 Contracture, right hand: Secondary | ICD-10-CM | POA: Diagnosis not present

## 2015-09-14 LAB — MICROALBUMIN, URINE: Microalb, Ur: 12.7

## 2015-09-14 LAB — BASIC METABOLIC PANEL
BUN: 31 mg/dL — AB (ref 4–21)
Creatinine: 1.7 mg/dL — AB (ref 0.5–1.1)
GLUCOSE: 232 mg/dL
POTASSIUM: 4.5 mmol/L (ref 3.4–5.3)
SODIUM: 136 mmol/L — AB (ref 137–147)

## 2015-09-15 DIAGNOSIS — M24541 Contracture, right hand: Secondary | ICD-10-CM | POA: Diagnosis not present

## 2015-09-15 DIAGNOSIS — G8191 Hemiplegia, unspecified affecting right dominant side: Secondary | ICD-10-CM | POA: Diagnosis not present

## 2015-09-17 DIAGNOSIS — G8191 Hemiplegia, unspecified affecting right dominant side: Secondary | ICD-10-CM | POA: Diagnosis not present

## 2015-09-17 DIAGNOSIS — M24541 Contracture, right hand: Secondary | ICD-10-CM | POA: Diagnosis not present

## 2015-09-18 DIAGNOSIS — G8191 Hemiplegia, unspecified affecting right dominant side: Secondary | ICD-10-CM | POA: Diagnosis not present

## 2015-09-18 DIAGNOSIS — M24541 Contracture, right hand: Secondary | ICD-10-CM | POA: Diagnosis not present

## 2015-09-19 DIAGNOSIS — G8191 Hemiplegia, unspecified affecting right dominant side: Secondary | ICD-10-CM | POA: Diagnosis not present

## 2015-09-19 DIAGNOSIS — M24541 Contracture, right hand: Secondary | ICD-10-CM | POA: Diagnosis not present

## 2015-09-20 DIAGNOSIS — G8191 Hemiplegia, unspecified affecting right dominant side: Secondary | ICD-10-CM | POA: Diagnosis not present

## 2015-09-20 DIAGNOSIS — M24541 Contracture, right hand: Secondary | ICD-10-CM | POA: Diagnosis not present

## 2015-09-21 DIAGNOSIS — M24541 Contracture, right hand: Secondary | ICD-10-CM | POA: Diagnosis not present

## 2015-09-21 DIAGNOSIS — G8191 Hemiplegia, unspecified affecting right dominant side: Secondary | ICD-10-CM | POA: Diagnosis not present

## 2015-10-01 ENCOUNTER — Other Ambulatory Visit: Payer: Self-pay

## 2015-10-01 MED ORDER — LORAZEPAM 0.5 MG PO TABS: ORAL_TABLET | ORAL | 5 refills | Status: DC

## 2015-10-01 NOTE — Telephone Encounter (Signed)
Faxed to [facility] Fax Number: [phone], Phone Number [phone]  

## 2015-10-03 ENCOUNTER — Other Ambulatory Visit (HOSPITAL_COMMUNITY): Payer: Self-pay | Admitting: Interventional Radiology

## 2015-10-03 ENCOUNTER — Telehealth (HOSPITAL_COMMUNITY): Payer: Self-pay | Admitting: Interventional Radiology

## 2015-10-03 DIAGNOSIS — I771 Stricture of artery: Secondary | ICD-10-CM

## 2015-10-03 NOTE — Telephone Encounter (Signed)
Called pt, left VM for her to call to schedule procedure. JM

## 2015-10-05 ENCOUNTER — Non-Acute Institutional Stay (SKILLED_NURSING_FACILITY): Payer: Medicare Other | Admitting: Nurse Practitioner

## 2015-10-05 ENCOUNTER — Encounter: Payer: Self-pay | Admitting: Nurse Practitioner

## 2015-10-05 DIAGNOSIS — N3281 Overactive bladder: Secondary | ICD-10-CM

## 2015-10-05 DIAGNOSIS — G8110 Spastic hemiplegia affecting unspecified side: Secondary | ICD-10-CM

## 2015-10-05 DIAGNOSIS — D500 Iron deficiency anemia secondary to blood loss (chronic): Secondary | ICD-10-CM | POA: Diagnosis not present

## 2015-10-05 DIAGNOSIS — M1990 Unspecified osteoarthritis, unspecified site: Secondary | ICD-10-CM | POA: Diagnosis not present

## 2015-10-05 DIAGNOSIS — N949 Unspecified condition associated with female genital organs and menstrual cycle: Secondary | ICD-10-CM

## 2015-10-05 DIAGNOSIS — I6523 Occlusion and stenosis of bilateral carotid arteries: Secondary | ICD-10-CM | POA: Diagnosis not present

## 2015-10-05 DIAGNOSIS — E1149 Type 2 diabetes mellitus with other diabetic neurological complication: Secondary | ICD-10-CM | POA: Diagnosis not present

## 2015-10-05 NOTE — Progress Notes (Signed)
Patient ID: Chelsey Gallagher, female   DOB: [birth year], 65 y.o.   MRN: [id number]    Nursing Home Location:  [place] of Service: SNF (31)  PCP: [name], DO  Allergies  Allergen Reactions  . Codeine Other (See Comments)    Per Mar  . Duloxetine Hcl   . Flexeril [Cyclobenzaprine Hcl] Other (See Comments)    Per Mar  . Lyrica [Pregabalin] Other (See Comments)    "wires my up"    Chief Complaint  Patient presents with  . Medical Management of Chronic Issues    Routine Visit    HPI:  Patient is a 65 y.o. female seen today at [facility] for routine follow up on chronic conditions. Pt with a hx of CVA with spastic hemiplegia, constipation, anxiety, DM, HTN, and hyperlipidemia. Reports improvement to spastic hemiplegia with muscle relaxer. Still having some discomfort but overall better Reports dry mouth- uses mouthwash that helps Ongoing vaginal discomfort that hurts when she urinates. Has gone through all the work up with urology and they report she is colonized.  Blood sugars remain elevated. Not eating a lot of sweets or carbs Plans to make appt with ortho due to left hand hurting, does this at times and injections are beneficial.  Review of Systems:  Review of Systems  Constitutional: Negative for activity change, appetite change, fatigue and unexpected weight change.  HENT: Negative for congestion, hearing loss and sinus pressure.   Eyes: Negative.   Respiratory: Negative for cough and shortness of breath.   Cardiovascular: Negative for chest pain, palpitations and leg swelling.  Gastrointestinal: Positive for constipation (controlled on medications). Negative for abdominal pain and diarrhea.  Genitourinary: Positive for frequency (vesicare helps). Negative for difficulty urinating.       Vaginal discomfort, burns when she urinates  Musculoskeletal: Negative for arthralgias.  Skin: Negative for color change and wound.  Neurological: Negative for  dizziness and weakness. Numbness: right side.  Psychiatric/Behavioral: Negative for behavioral problems and confusion. The patient is nervous/anxious (well controlled on current regimen).     Past Medical History:  Diagnosis Date  . Arthritis   . Chronic GI bleeding    presumed/notes 06/06/2015  . Complication of anesthesia    doesn't wake up good- "usually end up in ICU" also experiences post op nausea and vomiting  . Depression   . Diabetes mellitus   . Diabetic neuropathy (HCC)    Diabetic neuropathy- has inproved since she quit smoking.  . Foot drop, left   . H/O hiatal hernia    second one  . Hearing loss   . Heart murmur    PCP- [facility]- No tx needed.  Marland Kitchen History of blood transfusion 06/2015  . Hyperlipidemia 05/14/2011  . Hypertension   . Iron deficiency anemia   . Lung abnormality 05/11/2011  . Nephrolithiasis   . Orthostatic hypotension    after surgery.  Marland Kitchen PAD (peripheral artery disease) ([place])    S/p bypass grafting, unclear exactly where  . Stroke ([place]) 05/2011   Weakness on Right. Wears leg brace.   Past Surgical History:  Procedure Laterality Date  . ABDOMINAL HYSTERECTOMY  2003  . ABDOMINAL HYSTERECTOMY    . APPENDECTOMY  2011  . BYPASS GRAFT  2003   Abdominal aortic  . CAROTID STENT INSERTION Right 2013   ICA/notes 06/06/2015  . CESAREAN SECTION  1984  . CHOLECYSTECTOMY  1984  . COLONOSCOPY N/A 05/10/2013   Procedure: COLONOSCOPY;  Surgeon: Sydell Axon  Andris Baumann, MD;  Location: [place];  Service: Endoscopy;  Laterality: N/A;  . ESOPHAGOGASTRODUODENOSCOPY N/A 05/09/2013   Procedure: ESOPHAGOGASTRODUODENOSCOPY (EGD);  Surgeon: Lafayette Dragon, MD;  Location: [facility] ENDOSCOPY;  Service: Endoscopy;  Laterality: N/A;  . HERNIA REPAIR  2011  . JOINT REPLACEMENT    . [place]  . LITHOTRIPSY    . TOTAL HIP ARTHROPLASTY  2008   Right   Social History:   reports that she has quit smoking. Her smoking use included Cigarettes. She smoked 0.00  packs per day for 45.00 years. She quit smokeless tobacco use about 4 years ago. She reports that she does not drink alcohol or use drugs.  Family History  Problem Relation Age of Onset  . Cancer Maternal Aunt     breast    Medications: Patient's Medications  New Prescriptions   No medications on file  Previous Medications   ACETAMINOPHEN (TYLENOL) 325 MG TABLET    Take 650 mg by mouth every 6 (six) hours as needed for pain.   ALBUTEROL (PROVENTIL) (2.5 MG/3ML) 0.083% NEBULIZER SOLUTION    Take 3 mLs (2.5 mg total) by nebulization every 4 (four) hours as needed for wheezing or shortness of breath.   AMINO ACIDS-PROTEIN HYDROLYS (FEEDING SUPPLEMENT, PRO-STAT SUGAR FREE 64,) LIQD    Take 30 mLs by mouth 3 (three) times daily with meals.   AMLODIPINE (NORVASC) 5 MG TABLET    Take 1 tablet (5 mg total) by mouth daily.   ASPIRIN 81 MG CHEWABLE TABLET    Chew 81 mg by mouth daily.   BACLOFEN (LIORESAL) 10 MG TABLET    Take 5 mg by mouth 3 (three) times daily.   BISACODYL (DULCOLAX) 10 MG SUPPOSITORY    Place 10 mg rectally daily as needed for moderate constipation.   CHOLECALCIFEROL (VITAMIN D) 1000 UNITS TABLET    Take 1,000 Units by mouth daily.   CLOPIDOGREL (PLAVIX) 75 MG TABLET    Take 75 mg by mouth at bedtime.   DULOXETINE (CYMBALTA) 60 MG CAPSULE    Take 60 mg by mouth daily.   FENOFIBRATE (TRICOR) 48 MG TABLET    Take 48 mg by mouth daily.   FERROUS SULFATE 325 (65 FE) MG TABLET    Take 325 mg by mouth daily with breakfast.   GABAPENTIN (NEURONTIN) 100 MG CAPSULE    Take 1 capsule (100 mg total) by mouth at bedtime.   HYDRALAZINE (APRESOLINE) 50 MG TABLET    Take 50 mg by mouth 3 (three) times daily.   INSULIN ASPART (NOVOLOG FLEXPEN) 100 UNIT/ML FLEXPEN    Give 5 units SQ with meals. Give an additional 5 units for CBG over 150 with meals.   INSULIN DETEMIR (LEVEMIR FLEXPEN) 100 UNIT/ML PEN    Inject 18 Units into the skin daily.   LORATADINE (CLARITIN) 10 MG TABLET    Take 10 mg by  mouth daily.   LORAZEPAM (ATIVAN) 0.5 MG TABLET    Take 1/2 tablet(0.25 mg) by mouth every morning for anxiety(control). Take 1 tablet by mouth at bedtime for rest(control).   MAGNESIUM HYDROXIDE (MILK OF MAGNESIA) 800 MG/5ML SUSPENSION    Take 30 mLs by mouth daily as needed for constipation. 30cc   MENTHOL, TOPICAL ANALGESIC, (BIOFREEZE) 4 % GEL    To right ankle and bilateral knees TID   MULTIPLE VITAMINS-MINERALS (MULTI VITAMIN/MINERALS) TABS    Take 1 tablet by mouth daily.   NITROFURANTOIN (MACRODANTIN) 100 MG CAPSULE  Take 100 mg by mouth at bedtime.   NYSTATIN CREAM (MYCOSTATIN)    Apply 1 application topically 3 (three) times daily as needed (rash, redness, or itching).    OMEGA-3 ACID ETHYL ESTERS (LOVAZA) 1 G CAPSULE    Take 2 g by mouth 2 (two) times daily.   OMEPRAZOLE (PRILOSEC) 20 MG CAPSULE    Take 20 mg by mouth 2 (two) times daily with a meal.   PRAVASTATIN (PRAVACHOL) 40 MG TABLET    Take 40 mg by mouth daily.    PROMETHAZINE (PHENERGAN) 25 MG TABLET    Take either by mouth, IM injection, or suppository every 6 hours PRN for nausea or vomiting.   PROPYLENE GLYCOL (SYSTANE BALANCE) 0.6 % SOLN    Place 1 drop into both eyes 2 (two) times daily.   SENNOSIDES-DOCUSATE SODIUM (SENOKOT-S) 8.6-50 MG TABLET    Take 2 tablets by mouth 2 (two) times daily.    SOLIFENACIN (VESICARE) 5 MG TABLET    Take 5 mg by mouth daily.   TRAMADOL (ULTRAM) 50 MG TABLET    Take 50 mg by mouth every 8 (eight) hours as needed for moderate pain.   TRAZODONE (DESYREL) 50 MG TABLET    Take 25 mg by mouth at bedtime.   VITAMIN C (ASCORBIC ACID) 500 MG TABLET    Take 500 mg by mouth 2 (two) times daily.    WHEAT DEXTRIN (BENEFIBER) POWD    Take 1 scoop by mouth daily. Mix with 8oz of fluid  Modified Medications   No medications on file  Discontinued Medications   INSULIN ASPART (NOVOLOG) 100 UNIT/ML INJECTION    0-9 Units, Subcutaneous, 3 times daily with meals CBG < 70: implement hypoglycemia  protocol CBG 70 - 120: 0 units CBG 121 - 150: 1 unit CBG 151 - 200: 2 units CBG 201 - 250: 3 units CBG 251 - 300: 5 units CBG 301 - 350: 7 units CBG 351 - 400: 9 units CBG > 400: call MD   INSULIN ASPART (NOVOLOG) 100 UNIT/ML INJECTION    Give 5 units with meals. Give an additional 5 units for CBG over 150 with meals.   INSULIN DETEMIR (LEVEMIR) 100 UNIT/ML INJECTION    Inject 18 Units into the skin at bedtime.      Physical Exam: Vitals:   10/05/15 1040  BP: 127/67  Pulse: 92  Resp: 18  Temp: 97.7 F (36.5 C)  TempSrc: Oral  Weight: 154 lb (69.9 kg)  Height: 5\' 3"  (1.6 m)    Physical Exam  Constitutional: She is oriented to person, place, and time. She appears well-developed and well-nourished. No distress.  HENT:  Head: Normocephalic and atraumatic.  Eyes: Pupils are equal, round, and reactive to light.  Neck: Normal range of motion. Neck supple.  Cardiovascular: Normal rate and regular rhythm.   Murmur heard. Pulmonary/Chest: Effort normal and breath sounds normal. No respiratory distress.  Abdominal: Soft. Bowel sounds are normal. She exhibits no distension. There is no tenderness.  Rounded abdomen   Musculoskeletal: She exhibits no edema or tenderness.  Right sided hemiparesis   Lymphadenopathy:    She has no cervical adenopathy.  Neurological: She is alert and oriented to person, place, and time.  Skin: Skin is warm and dry. She is not diaphoretic.  Psychiatric: She has a normal mood and affect.    Labs reviewed: Basic Metabolic Panel:  Recent Labs  06/05/15 0510 06/06/15 0615 06/07/15 0918  08/14/15 08/22/15 09/14/15  NA 140 138  135  < > 132* 132* 136*  K 4.3 4.3 3.9  < > 4.9 4.7 4.5  CL 103 103 99*  --   --   --   --   CO2 25 25 23   --   --   --   --   GLUCOSE 97 114* 188*  --   --   --   --   BUN 29* 23* 21*  < > 42* 36* 31*  CREATININE 1.62* 1.51* 1.47*  < > 1.7* 1.7* 1.7*  CALCIUM 9.4 9.5 9.8  --   --   --   --   < > = values in this interval  not displayed. Liver Function Tests:  Recent Labs  06/04/15 1431 06/05/15 0510 06/06/15 0615  AST 18 19 20   ALT 14 14 13*  ALKPHOS 55 51 50  BILITOT 0.4 0.3 0.4  PROT 7.3 6.9 6.6  ALBUMIN 3.4* 3.1* 2.9*   No results for input(s): LIPASE, AMYLASE in the last 8760 hours. No results for input(s): AMMONIA in the last 8760 hours. CBC:  Recent Labs  06/04/15 1151  06/06/15 0615 06/07/15 0918  07/03/15 07/19/15 1157 08/21/15  WBC 7.3  < > 10.6* 7.7  < > 5.3 8.3 7.3  NEUTROABS 5.5  --   --   --   --   --  5.8  --   HGB 8.7*  < > 7.3* 10.3*  < > 8.0* 8.4* 9.6*  HCT 30.8*  < > 23.9* 32.5*  < > 26* 26.9* 30*  MCV 77.4*  < > 76.6* 76.5*  --   --  80.5  --   PLT 307  < > 289 340  < > 218 256 210  < > = values in this interval not displayed. TSH: No results for input(s): TSH in the last 8760 hours. A1C: Lab Results  Component Value Date   HGBA1C 6.8 08/14/2015   Lipid Panel:  Recent Labs  11/03/14 02/08/15  CHOL 146 143  HDL 35 39  LDLCALC 59 46  TRIG 259* 291*    Assessment/Plan 1. DM (diabetes mellitus), type 2 with neurological complications ([place]) Blood sugars remain elevated in the 400s. Pt not eating a lot of sweets but eats a lot of carbohydrates throughout the day. Levemir increased to 18 without much improvement, will increase to 20 units at this time -pt to also receive 5 units novolog with meal and an ADDITIONAL 5 units if blood sugar over 150.   2. Carotid stenosis, bilateral Following with IR. Plan for angioplasty with stenting on 10/17/15  3. Spastic hemiplegia affecting dominant side (HCC) Some improvement with baclofen. Will cont 5 mg TID at this time  4. Osteoarthritis, unspecified osteoarthritis type, unspecified site Reports worsening arthritis to left hand. Pt with acute on chronic pain needing injection per ortho. Pt plans on making appt to follow up with ortho at this time.   5. Overactive bladder -stable on vesicare  6. Iron deficiency anemia  due to chronic blood loss hgb stable, conts on iron daily   7. Vaginal burning Hx of chronic UTIs on chronic macrobid, question vaginal atrophy however due to inability to preform adequate vaginal exam in facility will refer to GYN at this time   Carlos American. Harle Battiest  [facility] [phone] 8 am - 5 pm) [phone] (after hours)

## 2015-10-09 ENCOUNTER — Other Ambulatory Visit: Payer: Self-pay | Admitting: Radiology

## 2015-10-11 NOTE — Pre-Procedure Instructions (Signed)
Chelsey Gallagher  10/11/2015     Your procedure is scheduled on : Wednesday October 17, 2015 at 9:00 AM.  Report to [facility] Admitting at 7:00 AM.  Call this number if you have problems the morning of surgery: [phone]    Remember:  Do not eat food or drink liquids after midnight.  Take these medicines the morning of surgery with A SIP OF WATER : Acetaminophen (Tylenol) if needed, Albuterol nebulizer if needed, Amlodipine (Norvasc), Baclofen (Lioresal), Duloxetine (Cymbalta), Hydralazine (Apresoline), Loratadine (Claritin), Omeprazole (Prilosec), Promethazine (Phenergan) if needed, Eye drops if needed, Solifenacin (Vesicare), Tramadol (Ultram) if needed, Clopidorgel (Plavix), Aspirin  Stop taking any vitamins, herbal medications/supplements, NSAIDs, Ibuprofen, Advil, Motrin,Aleve, Omega 3/Fish Oil, etc today     How to Manage Your Diabetes Before and After Surgery  Why is it important to control my blood sugar before and after surgery? . Improving blood sugar levels before and after surgery helps healing and can limit problems. . A way of improving blood sugar control is eating a healthy diet by: o  Eating less sugar and carbohydrates o  Increasing activity/exercise o  Talking with your doctor about reaching your blood sugar goals . High blood sugars (greater than 180 mg/dL) can raise your risk of infections and slow your recovery, so you will need to focus on controlling your diabetes during the weeks before surgery. . Make sure that the doctor who takes care of your diabetes knows about your planned surgery including the date and location.  How do I manage my blood sugar before surgery? . Check your blood sugar at least 4 times a day, starting 2 days before surgery, to make sure that the level is not too high or low. o Check your blood sugar the morning of your surgery when you wake up and every 2 hours until you get to the Short Stay unit. . If your blood sugar is  less than 70 mg/dL, you will need to treat for low blood sugar: o Do not take insulin. o Treat a low blood sugar (less than 70 mg/dL) with  cup of clear juice (cranberry or apple), 4 glucose tablets, OR glucose gel. o Recheck blood sugar in 15 minutes after treatment (to make sure it is greater than 70 mg/dL). If your blood sugar is not greater than 70 mg/dL on recheck, call [phone] for further instructions. . Report your blood sugar to the short stay nurse when you get to Short Stay.  . If you are admitted to the hospital after surgery: o Your blood sugar will be checked by the staff and you will probably be given insulin after surgery (instead of oral diabetes medicines) to make sure you have good blood sugar levels. o The goal for blood sugar control after surgery is 80-180 mg/dL.     WHAT DO I DO ABOUT MY DIABETES MEDICATION?   Marland Kitchen Do not take oral diabetes medicines (pills) the morning of surgery.  . THE NIGHT BEFORE SURGERY, DO NOT take a bedtime dose of Novolog insulin.     . THE MORNING OF SURGERY, take 10 units of Levemir insulin.  . If your CBG is greater than 220 mg/dL, you may take  of your sliding scale (correction) dose of insulin. (IF greater than 220 take 2.5 units of Novolog insulin, if NOT greater than 220 the day of surgery, Do NOT take any Novolog insulin)  Reviewed and Endorsed by [facility] Patient Education Committee, August 2015   Do  not wear jewelry, make-up or nail polish.  Do not wear lotions, powders, or perfumes.    Do not shave 48 hours prior to surgery.   Do not bring valuables to the hospital.  [facility] is not responsible for any belongings or valuables.  Contacts, dentures or bridgework may not be worn into surgery.  Leave your suitcase in the car.  After surgery it may be brought to your room.  For patients admitted to the hospital, discharge time will be determined by your treatment team.  Patients discharged the day of surgery will not be  allowed to drive home.   Name and phone number of your driver:    Special instructions:  Shower using CHG soap the night before and the morning of your surgery  Please read over the following fact sheets that you were given. Surgical Site Infection Prevention

## 2015-10-12 ENCOUNTER — Encounter (HOSPITAL_COMMUNITY): Payer: Self-pay

## 2015-10-12 ENCOUNTER — Emergency Department (HOSPITAL_COMMUNITY)
Admission: EM | Admit: 2015-10-12 | Discharge: 2015-10-12 | Disposition: A | Discharge status: Home / Self Care | Payer: Medicare Other | Attending: Emergency Medicine | Admitting: Emergency Medicine

## 2015-10-12 ENCOUNTER — Encounter (HOSPITAL_COMMUNITY): Payer: Self-pay | Admitting: Vascular Surgery

## 2015-10-12 ENCOUNTER — Encounter (HOSPITAL_COMMUNITY): Payer: Self-pay | Admitting: Emergency Medicine

## 2015-10-12 ENCOUNTER — Emergency Department (HOSPITAL_COMMUNITY): Payer: Medicare Other

## 2015-10-12 ENCOUNTER — Encounter (HOSPITAL_COMMUNITY)
Admission: RE | Admit: 2015-10-12 | Discharge: 2015-10-12 | Disposition: A | Discharge status: Home / Self Care | Payer: Medicare Other | Source: Ambulatory Visit | Attending: Interventional Radiology | Admitting: Interventional Radiology

## 2015-10-12 DIAGNOSIS — I739 Peripheral vascular disease, unspecified: Secondary | ICD-10-CM | POA: Diagnosis not present

## 2015-10-12 DIAGNOSIS — Z96641 Presence of right artificial hip joint: Secondary | ICD-10-CM | POA: Diagnosis not present

## 2015-10-12 DIAGNOSIS — R918 Other nonspecific abnormal finding of lung field: Secondary | ICD-10-CM | POA: Insufficient documentation

## 2015-10-12 DIAGNOSIS — Z79899 Other long term (current) drug therapy: Secondary | ICD-10-CM | POA: Insufficient documentation

## 2015-10-12 DIAGNOSIS — R05 Cough: Secondary | ICD-10-CM | POA: Diagnosis not present

## 2015-10-12 DIAGNOSIS — Z95820 Peripheral vascular angioplasty status with implants and grafts: Secondary | ICD-10-CM | POA: Diagnosis not present

## 2015-10-12 DIAGNOSIS — E1140 Type 2 diabetes mellitus with diabetic neuropathy, unspecified: Secondary | ICD-10-CM | POA: Insufficient documentation

## 2015-10-12 DIAGNOSIS — Z8673 Personal history of transient ischemic attack (TIA), and cerebral infarction without residual deficits: Secondary | ICD-10-CM | POA: Insufficient documentation

## 2015-10-12 DIAGNOSIS — Z01812 Encounter for preprocedural laboratory examination: Secondary | ICD-10-CM | POA: Diagnosis not present

## 2015-10-12 DIAGNOSIS — Z87440 Personal history of urinary (tract) infections: Secondary | ICD-10-CM | POA: Insufficient documentation

## 2015-10-12 DIAGNOSIS — I10 Essential (primary) hypertension: Secondary | ICD-10-CM | POA: Diagnosis not present

## 2015-10-12 DIAGNOSIS — E1165 Type 2 diabetes mellitus with hyperglycemia: Secondary | ICD-10-CM | POA: Diagnosis not present

## 2015-10-12 DIAGNOSIS — Z87891 Personal history of nicotine dependence: Secondary | ICD-10-CM | POA: Insufficient documentation

## 2015-10-12 DIAGNOSIS — Z95828 Presence of other vascular implants and grafts: Secondary | ICD-10-CM | POA: Insufficient documentation

## 2015-10-12 DIAGNOSIS — Z01818 Encounter for other preprocedural examination: Secondary | ICD-10-CM | POA: Diagnosis not present

## 2015-10-12 DIAGNOSIS — D509 Iron deficiency anemia, unspecified: Secondary | ICD-10-CM | POA: Diagnosis not present

## 2015-10-12 DIAGNOSIS — H9190 Unspecified hearing loss, unspecified ear: Secondary | ICD-10-CM | POA: Insufficient documentation

## 2015-10-12 DIAGNOSIS — R739 Hyperglycemia, unspecified: Secondary | ICD-10-CM | POA: Diagnosis present

## 2015-10-12 DIAGNOSIS — Z7902 Long term (current) use of antithrombotics/antiplatelets: Secondary | ICD-10-CM | POA: Diagnosis not present

## 2015-10-12 DIAGNOSIS — Z7982 Long term (current) use of aspirin: Secondary | ICD-10-CM | POA: Insufficient documentation

## 2015-10-12 DIAGNOSIS — Z794 Long term (current) use of insulin: Secondary | ICD-10-CM | POA: Insufficient documentation

## 2015-10-12 HISTORY — DX: Pneumonia, unspecified organism: J18.9

## 2015-10-12 HISTORY — DX: Nonrheumatic aortic (valve) stenosis: I35.0

## 2015-10-12 HISTORY — DX: Gastro-esophageal reflux disease without esophagitis: K21.9

## 2015-10-12 HISTORY — DX: Constipation, unspecified: K59.00

## 2015-10-12 HISTORY — DX: Unspecified urinary incontinence: R32

## 2015-10-12 HISTORY — DX: Urinary tract infection, site not specified: N39.0

## 2015-10-12 LAB — CBC WITH DIFFERENTIAL/PLATELET
BASOS ABS: 0 10*3/uL (ref 0.0–0.1)
BASOS PCT: 0 %
Basophils Absolute: 0 10*3/uL (ref 0.0–0.1)
Basophils Relative: 0 %
EOS ABS: 0.1 10*3/uL (ref 0.0–0.7)
Eosinophils Absolute: 0.1 10*3/uL (ref 0.0–0.7)
Eosinophils Relative: 1 %
Eosinophils Relative: 1 %
HEMATOCRIT: 32.1 % — AB (ref 36.0–46.0)
HEMATOCRIT: 31.4 % — AB (ref 36.0–46.0)
HEMOGLOBIN: 10.2 g/dL — AB (ref 12.0–15.0)
HEMOGLOBIN: 10 g/dL — AB (ref 12.0–15.0)
LYMPHS ABS: 1.4 10*3/uL (ref 0.7–4.0)
LYMPHS PCT: 19 %
Lymphocytes Relative: 22 %
Lymphs Abs: 1.6 10*3/uL (ref 0.7–4.0)
MCH: 30.4 pg (ref 26.0–34.0)
MCH: 30.3 pg (ref 26.0–34.0)
MCHC: 31.8 g/dL (ref 30.0–36.0)
MCHC: 31.8 g/dL (ref 30.0–36.0)
MCV: 95.5 fL (ref 78.0–100.0)
MCV: 95.2 fL (ref 78.0–100.0)
MONOS PCT: 7 %
Monocytes Absolute: 0.5 10*3/uL (ref 0.1–1.0)
Monocytes Absolute: 0.6 10*3/uL (ref 0.1–1.0)
Monocytes Relative: 9 %
NEUTROS ABS: 5.3 10*3/uL (ref 1.7–7.7)
NEUTROS ABS: 4.9 10*3/uL (ref 1.7–7.7)
NEUTROS PCT: 73 %
NEUTROS PCT: 68 %
Platelets: 229 10*3/uL (ref 150–400)
Platelets: 233 10*3/uL (ref 150–400)
RBC: 3.36 MIL/uL — AB (ref 3.87–5.11)
RBC: 3.3 MIL/uL — ABNORMAL LOW (ref 3.87–5.11)
RDW: 14.7 % (ref 11.5–15.5)
RDW: 14.8 % (ref 11.5–15.5)
WBC: 7.3 10*3/uL (ref 4.0–10.5)
WBC: 7.3 10*3/uL (ref 4.0–10.5)

## 2015-10-12 LAB — COMPREHENSIVE METABOLIC PANEL
ALBUMIN: 3.2 g/dL — AB (ref 3.5–5.0)
ALK PHOS: 71 U/L (ref 38–126)
ALT: 23 U/L (ref 14–54)
ANION GAP: 9 (ref 5–15)
AST: 18 U/L (ref 15–41)
BILIRUBIN TOTAL: 0.6 mg/dL (ref 0.3–1.2)
BUN: 26 mg/dL — ABNORMAL HIGH (ref 6–20)
CALCIUM: 9.9 mg/dL (ref 8.9–10.3)
CO2: 24 mmol/L (ref 22–32)
CREATININE: 1.52 mg/dL — AB (ref 0.44–1.00)
Chloride: 95 mmol/L — ABNORMAL LOW (ref 101–111)
GFR calc non Af Amer: 35 mL/min — ABNORMAL LOW (ref >60)
GFR, EST AFRICAN AMERICAN: 41 mL/min — AB (ref >60)
GLUCOSE: 463 mg/dL — AB (ref 65–99)
Potassium: 4.9 mmol/L (ref 3.5–5.1)
Sodium: 128 mmol/L — ABNORMAL LOW (ref 135–145)
TOTAL PROTEIN: 6.8 g/dL (ref 6.5–8.1)

## 2015-10-12 LAB — APTT: aPTT: 31 seconds (ref 24–36)

## 2015-10-12 LAB — GLUCOSE, CAPILLARY: GLUCOSE-CAPILLARY: 543 mg/dL — AB (ref 65–99)

## 2015-10-12 LAB — BASIC METABOLIC PANEL
ANION GAP: 10 (ref 5–15)
BUN: 26 mg/dL — ABNORMAL HIGH (ref 6–20)
CALCIUM: 9.9 mg/dL (ref 8.9–10.3)
CO2: 24 mmol/L (ref 22–32)
Chloride: 94 mmol/L — ABNORMAL LOW (ref 101–111)
Creatinine, Ser: 1.51 mg/dL — ABNORMAL HIGH (ref 0.44–1.00)
GFR, EST AFRICAN AMERICAN: 41 mL/min — AB (ref >60)
GFR, EST NON AFRICAN AMERICAN: 36 mL/min — AB (ref >60)
Glucose, Bld: 413 mg/dL — ABNORMAL HIGH (ref 65–99)
Potassium: 4.7 mmol/L (ref 3.5–5.1)
SODIUM: 128 mmol/L — AB (ref 135–145)

## 2015-10-12 LAB — PROTIME-INR
INR: 0.94
Prothrombin Time: 12.5 seconds (ref 11.4–15.2)

## 2015-10-12 LAB — CBG MONITORING, ED
GLUCOSE-CAPILLARY: 408 mg/dL — AB (ref 65–99)
GLUCOSE-CAPILLARY: 347 mg/dL — AB (ref 65–99)
GLUCOSE-CAPILLARY: 262 mg/dL — AB (ref 65–99)

## 2015-10-12 MED ORDER — INSULIN ASPART 100 UNIT/ML ~~LOC~~ SOLN
5.0000 [IU] | Freq: Once | SUBCUTANEOUS | Status: AC
  Administered 2015-10-12: 5 [IU] via SUBCUTANEOUS
  Filled 2015-10-12: qty 1

## 2015-10-12 MED ORDER — SODIUM CHLORIDE 0.9 % IV BOLUS (SEPSIS)
1000.0000 mL | Freq: Once | INTRAVENOUS | Status: AC
  Administered 2015-10-12: 1000 mL via INTRAVENOUS

## 2015-10-12 NOTE — ED Triage Notes (Signed)
CBG 408

## 2015-10-12 NOTE — ED Notes (Signed)
Unable to obtain weight due to pt unable to get up from wheel chair.

## 2015-10-12 NOTE — Progress Notes (Signed)
Nursing tech checked blood glucose and it was 543. Patient stated she "did not have any insulin this morning."  Nurse then called and informed Ebony Hail, [place] of this. Ebony Hail, [place] stated that patient needed to go to the ER since her blood glucose was greater than 500.  Nurse informed [facility] [phone]) of this (as transportation was waiting on her), and also informed patient of this.   Nurse called report to ER, and Nursing tech escorted patient to the ER.

## 2015-10-12 NOTE — Discharge Instructions (Signed)
Read the information below.  Use the prescribed medication as directed.  Please discuss all new medications with your pharmacist.  You may return to the Emergency Department at any time for worsening condition or any new symptoms that concern you.    It is very important that you follow a diabetic diet.  Please discuss this with your primary care provider and your facility managers.

## 2015-10-12 NOTE — ED Triage Notes (Signed)
Pt. Stated, My sugar is high for the last 2 months and the lab sent me over.

## 2015-10-12 NOTE — Progress Notes (Signed)
PCP is [name], DO  Patient denied having any acute cardiac or pulmonary issues, but did inform Nurse that she had a CVA that left her with some right sided weakness.   Patient has been residing at [facility] for the past four years.  Patient confirmed taking Plavix and Aspirin as prescribed.  Nurse inquired about blood glucose levels and patient informed Nurse that her blood glucose has "been high for a while....Marland Kitchenyesterday morning it was 208, but last week it was 594, and the lowest has been in the 90s." Patient also informed Nurse that she consumed some cereal with 2% milk, and drank some coffee today.   DOS instructed sheet was reviewed in detail with patient and Nurse instructed patient to have [facility] Nurse call Short Stay with any additional questions. Patient verbalized understanding.

## 2015-10-12 NOTE — ED Provider Notes (Signed)
[facility] DEPT Provider Note   CSN: [id number] Arrival date & time: 10/12/15  1045  First Provider Contact:  First MD Initiated Contact with Patient 10/12/15 1111        History   Chief Complaint Chief Complaint  Patient presents with  . Hyperglycemia    HPI Chelsey Gallagher is a 65 y.o. female.  HPI   Patient with chronically uncontrolled diabetes, CVA with right sided spastic hemiplegia, HTN, HLD, OA, anxiety reports she was sent from her facility for high blood sugar - initially 543, now improving.  PCP is [employer].  Per last PCP note, pt has had uncontrolled blood sugars for several months, eats a lot of carbs during the day.  Pt states that her facility does not offer a diabetic diet and she frequently eats starch at every meal, eats "only a few desserts."  She has had a dry cough recently but otherwise denies any sick symptoms.  States she thinks that is from the weather change.  She otherwise feels fine.     Past Medical History:  Diagnosis Date  . Arthritis   . Chronic GI bleeding    presumed/notes 06/06/2015  . Complication of anesthesia    doesn't wake up good- "usually end up in ICU" also experiences post op nausea and vomiting  . Constipation   . Depression   . Diabetes mellitus   . Diabetic neuropathy (HCC)    Diabetic neuropathy- has inproved since she quit smoking.  . Foot drop, left   . Frequent UTI   . GERD (gastroesophageal reflux disease)   . H/O hiatal hernia    second one  . Hearing loss   . Heart murmur    PCP- [facility]- No tx needed.  Marland Kitchen History of blood transfusion 06/2015  . Hyperlipidemia 05/14/2011  . Hypertension   . Iron deficiency anemia   . Lung abnormality 05/11/2011  . Nephrolithiasis   . Orthostatic hypotension    after surgery.  Marland Kitchen PAD (peripheral artery disease) ([place])    S/p bypass grafting, unclear exactly where  . Pneumonia    hx of  . Stroke ([place]) 05/04/2011   Weakness on Right. Wears leg brace.  .  Urinary incontinence     Patient Active Problem List   Diagnosis Date Noted  . Dyslipidemia associated with type 2 diabetes mellitus ([place]) 07/06/2015  . Iron deficiency anemia due to chronic blood loss   . Hematest positive stools   . Chronic constipation 05/18/2015  . Neuropathy, diabetic ([place]) 11/27/2014  . Overactive bladder 08/01/2014  . OA (osteoarthritis) 05/08/2014  . Generalized anxiety disorder 08/26/2013  . Late effects of CVA (cerebrovascular accident) 07/08/2013  . Depression   . Fecal occult blood test positive 05/08/2013  . Insomnia 04/26/2013  . Carotid stenosis 04/13/2012  . Cerebral artery occlusion with cerebral infarction ([place]) 04/13/2012  . Spastic hemiplegia affecting dominant side ([place]) 07/25/2011  . Tobacco abuse 05/14/2011  . Hypertension   . DM (diabetes mellitus), type 2 with neurological complications [facility])     Past Surgical History:  Procedure Laterality Date  . ABDOMINAL HYSTERECTOMY  2003  . ABDOMINAL HYSTERECTOMY    . APPENDECTOMY  2011  . BYPASS GRAFT  2003   Abdominal aortic  . CAROTID STENT INSERTION Right 2013   ICA/notes 06/06/2015  . CESAREAN SECTION  1984  . CHOLECYSTECTOMY  1984  . COLONOSCOPY N/A 05/10/2013   Procedure: COLONOSCOPY;  Surgeon: Lafayette Dragon, MD;  Location: [facility] ENDOSCOPY;  Service: Endoscopy;  Laterality: N/A;  . ESOPHAGOGASTRODUODENOSCOPY N/A 05/09/2013   Procedure: ESOPHAGOGASTRODUODENOSCOPY (EGD);  Surgeon: Lafayette Dragon, MD;  Location: [facility] ENDOSCOPY;  Service: Endoscopy;  Laterality: N/A;  . HERNIA REPAIR  2011  . JOINT REPLACEMENT Right   . [place]  . LITHOTRIPSY    . TOTAL HIP ARTHROPLASTY  2008   Right    OB History    No data available       Home Medications    Prior to Admission medications   Medication Sig Start Date End Date Taking? Authorizing Provider  acetaminophen (TYLENOL) 325 MG tablet Take 650 mg by mouth every 6 (six) hours as needed for pain.    Historical Provider, MD    albuterol (PROVENTIL) (2.5 MG/3ML) 0.083% nebulizer solution Take 3 mLs (2.5 mg total) by nebulization every 4 (four) hours as needed for wheezing or shortness of breath. 02/14/14   Theodis Blaze, MD  Amino Acids-Protein Hydrolys (FEEDING SUPPLEMENT, PRO-STAT SUGAR FREE 64,) LIQD Take 30 mLs by mouth daily. At 1700    Historical Provider, MD  amLODipine (NORVASC) 5 MG tablet Take 1 tablet (5 mg total) by mouth daily. 06/07/15   Shanker Kristeen Mans, MD  aspirin 81 MG chewable tablet Chew 81 mg by mouth daily.    Historical Provider, MD  baclofen (LIORESAL) 10 MG tablet Take 5 mg by mouth 3 (three) times daily.    Historical Provider, MD  bisacodyl (DULCOLAX) 10 MG suppository Place 10 mg rectally daily as needed for moderate constipation.    Historical Provider, MD  cholecalciferol (VITAMIN D) 1000 UNITS tablet Take 1,000 Units by mouth daily.    Historical Provider, MD  clopidogrel (PLAVIX) 75 MG tablet Take 75 mg by mouth at bedtime.    Historical Provider, MD  DULoxetine (CYMBALTA) 60 MG capsule Take 60 mg by mouth daily. 05/23/15   Historical Provider, MD  fenofibrate (TRICOR) 48 MG tablet Take 48 mg by mouth daily. 05/20/15   Historical Provider, MD  ferrous sulfate 325 (65 FE) MG tablet Take 325 mg by mouth daily with breakfast.    Historical Provider, MD  gabapentin (NEURONTIN) 100 MG capsule Take 1 capsule (100 mg total) by mouth at bedtime. 06/07/15   Shanker Kristeen Mans, MD  hydrALAZINE (APRESOLINE) 50 MG tablet Take 50 mg by mouth 3 (three) times daily.    Historical Provider, MD  insulin aspart (NOVOLOG FLEXPEN) 100 UNIT/ML FlexPen Give 5 units SQ with meals. Give an additional 5 units for CBG over 150 with meals.    Historical Provider, MD  Insulin Detemir (LEVEMIR FLEXPEN) 100 UNIT/ML Pen Inject 20 Units into the skin daily.     Historical Provider, MD  loratadine (CLARITIN) 10 MG tablet Take 10 mg by mouth daily.    Historical Provider, MD  LORazepam (ATIVAN) 0.5 MG tablet Take 1/2 tablet(0.25  mg) by mouth every morning for anxiety(control). Take 1 tablet by mouth at bedtime for rest(control). 10/01/15   Lauree Chandler, NP  magnesium hydroxide (MILK OF MAGNESIA) 800 MG/5ML suspension Take 30 mLs by mouth daily as needed for constipation. 30cc    Historical Provider, MD  Menthol, Topical Analgesic, (BIOFREEZE) 4 % GEL To right ankle and bilateral knees TID 08/27/13   Lauree Chandler, NP  Multiple Vitamins-Minerals (MULTI VITAMIN/MINERALS) TABS Take 1 tablet by mouth daily.    Historical Provider, MD  nitrofurantoin, macrocrystal-monohydrate, (MACROBID) 100 MG capsule Take 100 mg by mouth at bedtime.    Historical  Provider, MD  nystatin cream (MYCOSTATIN) Apply 1 application topically 3 (three) times daily as needed (rash, redness, or itching).     Historical Provider, MD  omega-3 acid ethyl esters (LOVAZA) 1 g capsule Take 2 g by mouth 2 (two) times daily.    Historical Provider, MD  omeprazole (PRILOSEC) 20 MG capsule Take 20 mg by mouth 2 (two) times daily with a meal.    Historical Provider, MD  pravastatin (PRAVACHOL) 40 MG tablet Take 40 mg by mouth daily.  04/11/11   Historical Provider, MD  promethazine (PHENERGAN) 25 MG tablet Take either by mouth, IM injection, or suppository every 6 hours PRN for nausea or vomiting.    Historical Provider, MD  Propylene Glycol (SYSTANE BALANCE) 0.6 % SOLN Place 1 drop into both eyes 2 (two) times daily.    Historical Provider, MD  sennosides-docusate sodium (SENOKOT-S) 8.6-50 MG tablet Take 2 tablets by mouth 2 (two) times daily.     Historical Provider, MD  solifenacin (VESICARE) 5 MG tablet Take 5 mg by mouth daily.    Historical Provider, MD  traMADol (ULTRAM) 50 MG tablet Take 50 mg by mouth every 8 (eight) hours as needed for moderate pain.    Historical Provider, MD  traZODone (DESYREL) 50 MG tablet Take 25 mg by mouth at bedtime.    Historical Provider, MD  vitamin C (ASCORBIC ACID) 500 MG tablet Take 500 mg by mouth 2 (two) times daily.      Historical Provider, MD  Wheat Dextrin (BENEFIBER) POWD Take 1 scoop by mouth daily. Mix with 8oz of fluid    Historical Provider, MD    Family History Family History  Problem Relation Age of Onset  . Cancer Maternal Aunt     breast    Social History Social History  Substance Use Topics  . Smoking status: Former Smoker    Packs/day: 0.00    Years: 45.00    Types: Cigarettes  . Smokeless tobacco: Former Systems developer    Quit date: 05/08/2011  . Alcohol use No     Allergies   Flexeril [cyclobenzaprine hcl]; Codeine; Duloxetine hcl; and Lyrica [pregabalin]   Review of Systems Review of Systems  All other systems reviewed and are negative.    Physical Exam Updated Vital Signs BP 140/57   Pulse 72   Temp 99.2 F (37.3 C) (Oral)   Resp 18   SpO2 92%   Physical Exam  Constitutional: She appears well-developed and well-nourished. No distress.  HENT:  Head: Normocephalic and atraumatic.  Neck: Neck supple.  Cardiovascular: Normal rate and regular rhythm.   Pulmonary/Chest: Effort normal and breath sounds normal. No respiratory distress. She has no wheezes.  Occasional cough   Abdominal: Soft. She exhibits no distension. There is no tenderness. There is no rebound and no guarding.  Neurological: She is alert.  Skin: She is not diaphoretic.  Nursing note and vitals reviewed.    ED Treatments / Results  Labs (all labs ordered are listed, but only abnormal results are displayed) Labs Reviewed  BASIC METABOLIC PANEL - Abnormal; Notable for the following:       Result Value   Sodium 128 (*)    Chloride 94 (*)    Glucose, Bld 413 (*)    BUN 26 (*)    Creatinine, Ser 1.51 (*)    GFR calc non Af Amer 36 (*)    GFR calc Af Amer 41 (*)    All other components within normal limits  CBC  WITH DIFFERENTIAL/PLATELET - Abnormal; Notable for the following:    RBC 3.30 (*)    Hemoglobin 10.0 (*)    HCT 31.4 (*)    All other components within normal limits  CBG MONITORING, ED -  Abnormal; Notable for the following:    Glucose-Capillary 408 (*)    All other components within normal limits  CBG MONITORING, ED - Abnormal; Notable for the following:    Glucose-Capillary 347 (*)    All other components within normal limits  CBG MONITORING, ED - Abnormal; Notable for the following:    Glucose-Capillary 262 (*)    All other components within normal limits    EKG  EKG Interpretation None       Radiology Dg Chest 2 View  Result Date: 10/12/2015 CLINICAL DATA:  Hyperglycemia and cough. EXAM: CHEST  2 VIEW COMPARISON:  06/04/2015 FINDINGS: Stable mild cardiac enlargement and tortuous calcified thoracic aorta. No acute pulmonary findings. No pleural effusion. The bony thorax is intact. IMPRESSION: No acute cardiopulmonary findings. Electronically Signed   By: Marijo Sanes M.D.   On: 10/12/2015 12:32    Procedures Procedures (including critical care time)  Medications Ordered in ED Medications  sodium chloride 0.9 % bolus 1,000 mL (0 mLs Intravenous Stopped 10/12/15 1254)  sodium chloride 0.9 % bolus 1,000 mL (0 mLs Intravenous Stopped 10/12/15 1400)  insulin aspart (novoLOG) injection 5 Units (5 Units Subcutaneous Given 10/12/15 1254)     Initial Impression / Assessment and Plan / ED Course  I have reviewed the triage vital signs and the nursing notes.  Pertinent labs & imaging results that were available during my care of the patient were reviewed by me and considered in my medical decision making (see chart for details).  Clinical Course    Afebrile, nontoxic patient with known uncontrolled diabetes, not following diabetic diet at facility.  Followed by [facility].  Pt sent from facility with elevated blood glucose.  She is not in DKA.   Sodium is low but normal when adjusting for hyperglycemia.  IVF, insulin given.  Pt continued to feel well throughout visit.  D/C to facility with PCP follow up, advised to attempt to follow diabetic meal plan.   Discussed result, findings, treatment, and follow up  with patient.  Pt given return precautions.  Pt verbalizes understanding and agrees with plan.       Final Clinical Impressions(s) / ED Diagnoses   Final diagnoses:  Hyperglycemia    New Prescriptions Discharge Medication List as of 10/12/2015  2:25 PM       Clayton Bibles, PA-C 10/12/15 [place], [place] 10/13/15 1207

## 2015-10-13 LAB — HEMOGLOBIN A1C
HEMOGLOBIN A1C: 9.2 % — AB (ref 4.8–5.6)
MEAN PLASMA GLUCOSE: 217 mg/dL

## 2015-10-15 ENCOUNTER — Encounter (HOSPITAL_COMMUNITY): Payer: Self-pay

## 2015-10-15 NOTE — Progress Notes (Addendum)
Anesthesia Chart Review:  Patient is a 65 year old female scheduled for cerebral arteriogram with possible left ICA angioplasty/stent placement on 10/17/15 by Dr. Estanislado Pandy.  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 Incidental finding on 05/09/11 CTA showed a 1) suspicious left mandibular node s/p FNA (left cervical LN) that showed no malignancy, and 2) RUL ground glass lung opacity with three month follow-up recommended--05/12/11 chest CT showed a different configuration of the RUL lesion from prior neck CT, and therefore not favored to be a true nodule; however 3 month follow-up previously recommended is reasonable per radiologist Dr. Theodis Blaze.  - Admitted 06/04/15-06/07/15 for toxic metabolic encephalopathy (likely secondary to polypharmacy and probable UTI), ARF (improved with hydration), iron deficiency anemia with heme positive stools (long standing history of FOBT stools, previously EGD/colonscopy 2015 "negative."; s/p 1Unit PRBC. GI did not recommend repeat endoscopy. Neurology disontinued Plavix, continued ASA.)  Echo in 2014 showed moderate AS and findings consistent with HOCM physiology.   She also describes anesthesia complication of hypotension.  She has never required re-intubation post-operatively.    PCP is listed as Dr. [name]. Last visit on 10/05/15 with Sherrie Mustache, NP with [facility]).  Hematologist is Dr. Zola Button, recently seen 07/19/15 for iron deficiency anemia.  Meds include albuterol, amlodipine, aspirin 81 mg, Plavix,  Cymbalta, TriCor, 65 FE, Neurontin, hydralazine, NovoLog, Levemir, Ativan, Claritin, Lovaza, Prilosec, pravastatin, tramadol, trazodone.  BP 120/61   Pulse 64   Temp 37.1 C   Resp 18   Ht 5\' 3"  (1.6 m)   Wt 155 lb (70.3 kg)   SpO2 96%   BMI 27.46 kg/m  CBG at PAT was 543. She reported glucose readings in the 90's -594 at [facility]. Patient was transferred to the ED from PAT due to glucose > 500. Not felt to be in DKA. IVF and insulin given. Reportedly no being given DM diet at SNF and not given insulin that morning.  06/04/15 EKG: SR, probable LAE, abnormal R-wave progression, early transition, LVH with secondary repolarization. Overall, tracing appears stable when compared to 02/12/14 EKG.  04/07/12 Echo (done during admission for orthostatic hypotension in the setting of recent gastroenteritis): Study Conclusions - Left ventricle: The cavity size was normal. Wall thickness was increased in a pattern of severe LVH. Systolic function was vigorous. The estimated ejection fraction was in the range of 65% to 70%. Wall motion was normal; there were no regional wall motion abnormalities. Doppler parameters are consistent with abnormal left ventricular relaxation (grade 1 diastolic dysfunction). - Aortic valve: Valve mobility was restricted. There was moderate stenosis. Valve area: 1.53cm^2(VTI). Valve area: 1.71cm^2 (Vmax). - Mitral valve: Calcified annulus. There was systolic anterior motion of the chordal structures. - Left atrium: The atrium was mildly dilated. - Pulmonary arteries: Systolic pressure was mildly increased. PA peak pressure: 31mm Hg (S). Impressions: - Vigorous LV function with chordal SAM and proximal septal thickening; LVOT gradient at rest of5 m/s; findings c/w HOCM physiology.  08/31/15 CTA head/neck (ordered by Dr. Estanislado Pandy): IMPRESSION: - Critical stenosis LEFT ICA. There is a short segment of severe narrowing with no visible lumen. The  distal cervical ICA vessel is  small. A similar appearance was noted ni 2013. - Unremarkable appearing RIGHT carotid bifurcation status post placement of endovascular stent. No luminal narrowing. - BILATERAL vertebral artery ostial stenoses. 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 - Enlarged thyroid, probable goiter. Consider nuclear medicine scan for further evaluation.  05/15/15 Caroid [place]: Summary: - Bilateral- Vertebral Artery flow is antegrade. - Right side- Carotid stent appears to be patent with velocities meeting <50% diagnostic criteria for post carotid stent. - Left side prior exam 07/23 2015 ICA appears to have been occluded, suggesting todays exam it ICA appears to reconstitional with an 1-39% stenosis.  10/12/15 CXR: IMPRESSION: No acute cardiopulmonary findings.  Labs from 10/12/15 noted. Na 128, Cr 1.52-1.52, previously 1.7 08/2015. H/H 10.0/31.4. PT/PTT WNL. A1c 9.2, consistent with average glucose of 217 (up from 6.8 on 08/14/15). Would anticipate that she will need a p2y12 prior to surgery (defer to IR orders). Will also order a STAT BMET to re-evaluate Na, Cr, glucose.  Patient with critical left ICA stenosis. She has history of neuro deficits from previous CVA, but it's unclear to me currently if she is symptomatic of her left ICA disease. Discussed with anesthesiologist Dr. Deatra Canter and recommend cardiology pre-procedure evaluation due to finding of moderate AS and hypertrophic obstructive cardiomyopathy on 2014 echo. I don't see that she was referred to a cardiologist following results. I don't see findings discussed in her primary care notes either. Also DM with worsening control over the past two months with ED visit on Friday due to non-fasting glucose > 500. She was discharged back to [facility] for on-going DM management. She is on insulin. A significantly elevated glucose on the day of surgery could also  effect timing of surgery plans. I discussed with IR PA Abigail Butts regarding hyperglycemia and need for cardiology pre-procedure evaluation. If Dr. Estanislado Pandy feels risks of delaying procedure are too great than I asked that he contact Dr. Suzette Battiest or another partner, but can contact me if needed as well.    Chelsey Gallagher [facility] Short Stay Center/Anesthesiology Phone [phone] 10/15/2015 4:54 PM

## 2015-10-17 ENCOUNTER — Ambulatory Visit (HOSPITAL_COMMUNITY): Admission: RE | Admit: 2015-10-17 | Payer: Medicare Other | Source: Ambulatory Visit

## 2015-10-17 ENCOUNTER — Encounter (HOSPITAL_COMMUNITY): Admission: RE | Payer: Self-pay | Source: Ambulatory Visit

## 2015-10-17 ENCOUNTER — Ambulatory Visit (HOSPITAL_COMMUNITY)
Admission: RE | Admit: 2015-10-17 | Payer: Medicare Other | Source: Ambulatory Visit | Admitting: Interventional Radiology

## 2015-10-17 ENCOUNTER — Encounter (HOSPITAL_COMMUNITY): Payer: Self-pay

## 2015-10-17 SURGERY — RADIOLOGY WITH ANESTHESIA
Anesthesia: General

## 2015-10-19 ENCOUNTER — Telehealth: Payer: Self-pay | Admitting: Oncology

## 2015-10-19 ENCOUNTER — Ambulatory Visit: Payer: Medicare Other

## 2015-10-19 ENCOUNTER — Other Ambulatory Visit (HOSPITAL_BASED_OUTPATIENT_CLINIC_OR_DEPARTMENT_OTHER): Payer: Medicare Other

## 2015-10-19 ENCOUNTER — Ambulatory Visit (HOSPITAL_BASED_OUTPATIENT_CLINIC_OR_DEPARTMENT_OTHER): Payer: Medicare Other | Admitting: Oncology

## 2015-10-19 VITALS — BP 115/65 | HR 86 | Temp 98.0°F | Resp 18

## 2015-10-19 DIAGNOSIS — F411 Generalized anxiety disorder: Secondary | ICD-10-CM | POA: Diagnosis not present

## 2015-10-19 DIAGNOSIS — D509 Iron deficiency anemia, unspecified: Secondary | ICD-10-CM | POA: Diagnosis not present

## 2015-10-19 DIAGNOSIS — N289 Disorder of kidney and ureter, unspecified: Secondary | ICD-10-CM

## 2015-10-19 DIAGNOSIS — G4700 Insomnia, unspecified: Secondary | ICD-10-CM | POA: Diagnosis not present

## 2015-10-19 DIAGNOSIS — F329 Major depressive disorder, single episode, unspecified: Secondary | ICD-10-CM | POA: Diagnosis not present

## 2015-10-19 DIAGNOSIS — D500 Iron deficiency anemia secondary to blood loss (chronic): Secondary | ICD-10-CM

## 2015-10-19 LAB — CBC WITH DIFFERENTIAL/PLATELET
BASO%: 0.4 % (ref 0.0–2.0)
Basophils Absolute: 0 10*3/uL (ref 0.0–0.1)
EOS%: 1.5 % (ref 0.0–7.0)
Eosinophils Absolute: 0.1 10*3/uL (ref 0.0–0.5)
HCT: 30.9 % — ABNORMAL LOW (ref 34.8–46.6)
HGB: 10.1 g/dL — ABNORMAL LOW (ref 11.6–15.9)
LYMPH%: 16 % (ref 14.0–49.7)
MCH: 30.5 pg (ref 25.1–34.0)
MCHC: 32.7 g/dL (ref 31.5–36.0)
MCV: 93.2 fL (ref 79.5–101.0)
MONO#: 0.6 10*3/uL (ref 0.1–0.9)
MONO%: 7.5 % (ref 0.0–14.0)
NEUT#: 6.3 10*3/uL (ref 1.5–6.5)
NEUT%: 74.6 % (ref 38.4–76.8)
Platelets: 280 10*3/uL (ref 145–400)
RBC: 3.32 10*6/uL — AB (ref 3.70–5.45)
RDW: 15 % — ABNORMAL HIGH (ref 11.2–14.5)
WBC: 8.5 10*3/uL (ref 3.9–10.3)
lymph#: 1.4 10*3/uL (ref 0.9–3.3)

## 2015-10-19 NOTE — Telephone Encounter (Signed)
Gave pt cal & avs °

## 2015-10-19 NOTE — Progress Notes (Signed)
Hematology and Oncology Follow Up Visit  Chelsey Gallagher [id number] 1950/08/25 65 y.o. 10/19/2015 2:45 PM Chelsey Gallagher, [facility], [place], [place]   Principle Diagnosis:  65 year old woman with iron deficiency anemia as well as anemia of renal disease. She was diagnosed in April  2017.   Prior Therapy: She received packed red cell transfusion in the past last transfusion given in April 2017.  Current therapy: Oral iron therapy with iron sulfate 325 mg daily.  Interim History:  Chelsey Gallagher presents today for a follow-up visit. Since the last visit, she reports no major changes in her health. She was seen in the emergency department on 10/12/2015 for hyperglycemia. This issue have resolved and feeling reasonably fair. She denied any excessive fatigue or tiredness. He denied any dyspnea on exertion. She continues to be on oral iron supplements once a day although she is reporting constipation. She reports she moves her bowels daily but her stool are indeed forward. She denied any hematochezia or melena. She denied any other bleeding symptoms.   She does not report any headaches, blurry vision, syncope or seizures. She does not report any fevers, chills, sweats or weight loss. She does not report any chest pain, palpitation, orthopnea or leg edema. She does not report any cough, wheezing or hemoptysis. Does not report any nausea, vomiting or abdominal pain. She does not report any frequency urgency or hesitancy. She does not report any skeletal complaints. Remaining review of systems unremarkable.   Medications: I have reviewed the patient's current medications.  Current Outpatient Prescriptions  Medication Sig Dispense Refill  . acetaminophen (TYLENOL) 325 MG tablet Take 650 mg by mouth every 6 (six) hours as needed for pain.    Marland Kitchen albuterol (PROVENTIL) (2.5 MG/3ML) 0.083% nebulizer solution Take 3 mLs (2.5 mg total) by nebulization every 4 (four) hours as needed for wheezing or shortness of breath. 75  mL 12  . Amino Acids-Protein Hydrolys (FEEDING SUPPLEMENT, PRO-STAT SUGAR FREE 64,) LIQD Take 30 mLs by mouth daily. At 1700    . amLODipine (NORVASC) 5 MG tablet Take 1 tablet (5 mg total) by mouth daily.    Marland Kitchen aspirin 81 MG chewable tablet Chew 81 mg by mouth daily.    . baclofen (LIORESAL) 10 MG tablet Take 5 mg by mouth 3 (three) times daily.    . bisacodyl (DULCOLAX) 10 MG suppository Place 10 mg rectally daily as needed for moderate constipation.    . cholecalciferol (VITAMIN D) 1000 UNITS tablet Take 1,000 Units by mouth daily.    . clopidogrel (PLAVIX) 75 MG tablet Take 75 mg by mouth at bedtime.    . DULoxetine (CYMBALTA) 60 MG capsule Take 60 mg by mouth daily.    . fenofibrate (TRICOR) 48 MG tablet Take 48 mg by mouth daily.    . ferrous sulfate 325 (65 FE) MG tablet Take 325 mg by mouth daily with breakfast.    . gabapentin (NEURONTIN) 100 MG capsule Take 1 capsule (100 mg total) by mouth at bedtime.    . hydrALAZINE (APRESOLINE) 50 MG tablet     . insulin aspart (NOVOLOG FLEXPEN) 100 UNIT/ML FlexPen Give 5 units SQ with meals. Give an additional 5 units for CBG over 150 with meals.    . Insulin Detemir (LEVEMIR FLEXPEN) 100 UNIT/ML Pen Inject 20 Units into the skin daily.     Marland Kitchen loratadine (CLARITIN) 10 MG tablet Take 10 mg by mouth daily.    Marland Kitchen LORazepam (ATIVAN) 0.5 MG tablet Take 1/2 tablet(0.25 mg) by  mouth every morning for anxiety(control). Take 1 tablet by mouth at bedtime for rest(control). 45 tablet 5  . magnesium hydroxide (MILK OF MAGNESIA) 800 MG/5ML suspension Take 30 mLs by mouth daily as needed for constipation. 30cc    . Menthol, Topical Analgesic, (BIOFREEZE) 4 % GEL To right ankle and bilateral knees TID    . Multiple Vitamins-Minerals (MULTI VITAMIN/MINERALS) TABS Take 1 tablet by mouth daily.    . nitrofurantoin, macrocrystal-monohydrate, (MACROBID) 100 MG capsule Take 100 mg by mouth at bedtime.    Marland Kitchen omega-3 acid ethyl esters (LOVAZA) 1 g capsule Take 2 g by mouth  2 (two) times daily.    Marland Kitchen omeprazole (PRILOSEC) 20 MG capsule Take 20 mg by mouth 2 (two) times daily with a meal.    . pravastatin (PRAVACHOL) 40 MG tablet     . promethazine (PHENERGAN) 25 MG tablet Take either by mouth, IM injection, or suppository every 6 hours PRN for nausea or vomiting.    Marland Kitchen Propylene Glycol (SYSTANE BALANCE) 0.6 % SOLN Place 1 drop into both eyes 2 (two) times daily.    . sennosides-docusate sodium (SENOKOT-S) 8.6-50 MG tablet Take 2 tablets by mouth 2 (two) times daily.     . solifenacin (VESICARE) 5 MG tablet Take 5 mg by mouth daily.    . traMADol (ULTRAM) 50 MG tablet Take 50 mg by mouth every 8 (eight) hours as needed for moderate pain.    . traZODone (DESYREL) 50 MG tablet Take 25 mg by mouth at bedtime.    . vitamin C (ASCORBIC ACID) 500 MG tablet Take 500 mg by mouth 2 (two) times daily.     . Wheat Dextrin (BENEFIBER) POWD Take 1 scoop by mouth daily. Mix with 8oz of fluid     No current facility-administered medications for this visit.      Allergies:  Allergies  Allergen Reactions  . Flexeril [Cyclobenzaprine Hcl] Anaphylaxis  . Duloxetine Hcl Other (See Comments)    UNSPECIFIED   . Lyrica [Pregabalin] Other (See Comments)    "wires my up"  . Codeine Nausea And Vomiting    Past Medical History, Surgical history, Social history, and Family History were reviewed and updated.   Physical Exam: Blood pressure 115/65, pulse 86, temperature 98 F (36.7 C), temperature source Oral, resp. rate 18, SpO2 97 %. ECOG: 2 General appearance: alert and cooperative Head: Normocephalic, without obvious abnormality Neck: no adenopathy Lymph nodes: Cervical, supraclavicular, and axillary nodes normal. Heart:regular rate and rhythm, S1, S2 normal, no murmur, click, rub or gallop Lung:chest clear, no wheezing, rales, normal symmetric air entry Abdomin: soft, non-tender, without masses or organomegaly EXT:no erythema, induration, or nodules   Lab Results: Lab  Results  Component Value Date   WBC 8.5 10/19/2015   HGB 10.1 (L) 10/19/2015   HCT 30.9 (L) 10/19/2015   MCV 93.2 10/19/2015   PLT 280 10/19/2015     Chemistry      Component Value Date/Time   NA 128 (L) 10/12/2015 1145   NA 136 (A) 09/14/2015   K 4.7 10/12/2015 1145   CL 94 (L) 10/12/2015 1145   CO2 24 10/12/2015 1145   BUN 26 (H) 10/12/2015 1145   BUN 31 (A) 09/14/2015   CREATININE 1.51 (H) 10/12/2015 1145   GLU 232 09/14/2015      Component Value Date/Time   CALCIUM 9.9 10/12/2015 1145   ALKPHOS 71 10/12/2015 1028   AST 18 10/12/2015 1028   ALT 23 10/12/2015 1028   BILITOT  0.6 10/12/2015 1028        Impression and Plan:    65 year old woman with the following issues:  1. Iron deficiency anemia dating back to at least 2013. At that time her iron was less than 10 and a hemoglobin as low as 8.4. She had fluctuations in her hemoglobin and have required packed red cell transfusions most recently in April 2017. Her GI workup did not show clear-cut evidence of active bleeding.   She is currently on oral iron supplement on a daily basis and her hemoglobin have improved. Her CBC today showed a hemoglobin of 10 and she is asymptomatic. I recommended continuing oral iron for another 3-4 weeks and discontinue at that time. If she develops worsening iron deficiency in the future, IV iron can be also used if she cannot tolerate oral iron.  2. Renal insufficiency: It is also contributing to her mild anemia and she could benefit from growth factor support if her hemoglobin drops in the future.  3. Follow-up: Will be in 6 months to recheck her iron stores.   Zola Button, MD 8/18/20172:45 PM

## 2015-10-22 LAB — IRON AND TIBC
%SAT: 16 % — AB (ref 21–57)
IRON: 40 ug/dL — AB (ref 41–142)
TIBC: 250 ug/dL (ref 236–444)
UIBC: 210 ug/dL (ref 120–384)

## 2015-10-22 LAB — FERRITIN: Ferritin: 263 ng/ml (ref 9–269)

## 2015-10-24 DIAGNOSIS — Z01810 Encounter for preprocedural cardiovascular examination: Secondary | ICD-10-CM | POA: Diagnosis not present

## 2015-10-24 DIAGNOSIS — I6350 Cerebral infarction due to unspecified occlusion or stenosis of unspecified cerebral artery: Secondary | ICD-10-CM | POA: Diagnosis not present

## 2015-10-24 DIAGNOSIS — I6523 Occlusion and stenosis of bilateral carotid arteries: Secondary | ICD-10-CM | POA: Diagnosis not present

## 2015-10-24 DIAGNOSIS — I10 Essential (primary) hypertension: Secondary | ICD-10-CM | POA: Diagnosis not present

## 2015-10-29 DIAGNOSIS — I10 Essential (primary) hypertension: Secondary | ICD-10-CM | POA: Diagnosis not present

## 2015-10-29 DIAGNOSIS — Z01810 Encounter for preprocedural cardiovascular examination: Secondary | ICD-10-CM | POA: Diagnosis not present

## 2015-10-29 DIAGNOSIS — R9431 Abnormal electrocardiogram [ECG] [EKG]: Secondary | ICD-10-CM | POA: Diagnosis not present

## 2015-11-02 ENCOUNTER — Non-Acute Institutional Stay (SKILLED_NURSING_FACILITY): Payer: Medicare Other | Admitting: Nurse Practitioner

## 2015-11-02 ENCOUNTER — Encounter: Payer: Self-pay | Admitting: Nurse Practitioner

## 2015-11-02 DIAGNOSIS — B373 Candidiasis of vulva and vagina: Secondary | ICD-10-CM | POA: Diagnosis not present

## 2015-11-02 DIAGNOSIS — E1142 Type 2 diabetes mellitus with diabetic polyneuropathy: Secondary | ICD-10-CM | POA: Diagnosis not present

## 2015-11-02 DIAGNOSIS — G8110 Spastic hemiplegia affecting unspecified side: Secondary | ICD-10-CM | POA: Diagnosis not present

## 2015-11-02 DIAGNOSIS — E1149 Type 2 diabetes mellitus with other diabetic neurological complication: Secondary | ICD-10-CM | POA: Diagnosis not present

## 2015-11-02 DIAGNOSIS — I10 Essential (primary) hypertension: Secondary | ICD-10-CM | POA: Diagnosis not present

## 2015-11-02 DIAGNOSIS — I6523 Occlusion and stenosis of bilateral carotid arteries: Secondary | ICD-10-CM | POA: Diagnosis not present

## 2015-11-02 DIAGNOSIS — D500 Iron deficiency anemia secondary to blood loss (chronic): Secondary | ICD-10-CM | POA: Diagnosis not present

## 2015-11-02 DIAGNOSIS — B3731 Acute candidiasis of vulva and vagina: Secondary | ICD-10-CM

## 2015-11-02 NOTE — Progress Notes (Signed)
Patient ID: Chelsey Gallagher, female   DOB: Aug 03, 1950, 65 y.o.   MRN: [id number]    Nursing Home Location:  [facility]  Place of Service: SNF (31)  PCP: [name], MD  Allergies  Allergen Reactions  . Flexeril [Cyclobenzaprine Hcl] Anaphylaxis  . Duloxetine Hcl Other (See Comments)    UNSPECIFIED   . Lyrica [Pregabalin] Other (See Comments)    "wires my up"  . Codeine Nausea And Vomiting    Chief Complaint  Patient presents with  . Medical Management of Chronic Issues    Routine Visit    HPI:  Patient is a 65 y.o. female seen today at [facility] for routine follow up on chronic conditions. Pt with a hx of CVA with spastic hemiplegia, constipation, anxiety, DM, HTN, and hyperlipidemia. Pt reports ongoing dysuria, burning and tenderness when she wipes after urination. No abdominal discomfort. No fevers or chills. Blood sugars remain elevated.disucssed dietary changes. Metformin was stopped due to elevated BUN/Cr and since blood sugars have not been well controlled despite active titration of long acting insulin and meal coverage. Pt recently has seen cardiology for cardiac clearance for angioplasty.  Mood has been stable. No increase in anxiety or depression.   Review of Systems:  Review of Systems  Constitutional: Negative for activity change, appetite change, fatigue and unexpected weight change.  HENT: Negative for congestion, hearing loss and sinus pressure.   Eyes: Negative.   Respiratory: Negative for cough and shortness of breath.   Cardiovascular: Negative for chest pain, palpitations and leg swelling.  Gastrointestinal: Positive for constipation (controlled on medications). Negative for abdominal pain and diarrhea.  Genitourinary: Positive for frequency (vesicare helps). Negative for difficulty urinating.       Vaginal discomfort, burns when she urinates  Musculoskeletal: Negative for arthralgias.  Skin: Negative for color change and wound.    Neurological: Negative for dizziness and weakness. Numbness: right side.  Psychiatric/Behavioral: Negative for behavioral problems and confusion. The patient is nervous/anxious (well controlled on current regimen).     Past Medical History:  Diagnosis Date  . Aortic stenosis    moderate AS 2014 echo  . Arthritis   . Chronic GI bleeding    presumed/notes 06/06/2015  . Complication of anesthesia    doesn't wake up good- "usually end up in ICU" also experiences post op nausea and vomiting  . Constipation   . Depression   . Diabetes mellitus   . Diabetic neuropathy (HCC)    Diabetic neuropathy- has inproved since she quit smoking.  . Foot drop, left   . Frequent UTI   . GERD (gastroesophageal reflux disease)   . H/O hiatal hernia    second one  . Hearing loss   . Heart murmur    PCP- [facility]- No tx needed.  Marland Kitchen History of blood transfusion 06/2015  . Hyperlipidemia 05/14/2011  . Hypertension   . Iron deficiency anemia   . Lung abnormality 05/11/2011  . Nephrolithiasis   . Orthostatic hypotension    after surgery.  Marland Kitchen PAD (peripheral artery disease) ([place])    S/p bypass grafting, unclear exactly where  . Pneumonia    hx of  . Stroke ([place]) 05/04/2011   Weakness on Right. Wears leg brace.  . Urinary incontinence    Past Surgical History:  Procedure Laterality Date  . ABDOMINAL HYSTERECTOMY  2003  . ABDOMINAL HYSTERECTOMY    . APPENDECTOMY  2011  . BYPASS GRAFT  2003   Abdominal aortic  .  CAROTID STENT INSERTION Right 2013   ICA/notes 06/06/2015  . CESAREAN SECTION  1984  . CHOLECYSTECTOMY  1984  . COLONOSCOPY N/A 05/10/2013   Procedure: COLONOSCOPY;  Surgeon: Lafayette Dragon, MD;  Location: [facility] ENDOSCOPY;  Service: Endoscopy;  Laterality: N/A;  . ESOPHAGOGASTRODUODENOSCOPY N/A 05/09/2013   Procedure: ESOPHAGOGASTRODUODENOSCOPY (EGD);  Surgeon: Lafayette Dragon, MD;  Location: [facility] ENDOSCOPY;  Service: Endoscopy;  Laterality: N/A;  . HERNIA REPAIR  2011  . JOINT  REPLACEMENT Right   . [place]  . LITHOTRIPSY    . TOTAL HIP ARTHROPLASTY  2008   Right   Social History:   reports that she has quit smoking. Her smoking use included Cigarettes. She smoked 0.00 packs per day for 45.00 years. She quit smokeless tobacco use about 4 years ago. She reports that she does not drink alcohol or use drugs.  Family History  Problem Relation Age of Onset  . Cancer Maternal Aunt     breast    Medications: Patient's Medications  New Prescriptions   No medications on file  Previous Medications   ACETAMINOPHEN (TYLENOL) 325 MG TABLET    Take 650 mg by mouth every 6 (six) hours as needed for pain.   ALBUTEROL (PROVENTIL) (2.5 MG/3ML) 0.083% NEBULIZER SOLUTION    Take 3 mLs (2.5 mg total) by nebulization every 4 (four) hours as needed for wheezing or shortness of breath.   AMINO ACIDS-PROTEIN HYDROLYS (FEEDING SUPPLEMENT, PRO-STAT SUGAR FREE 64,) LIQD    Take 30 mLs by mouth daily. At 1700   AMLODIPINE (NORVASC) 5 MG TABLET    Take 1 tablet (5 mg total) by mouth daily.   ASPIRIN 81 MG CHEWABLE TABLET    Chew 81 mg by mouth daily.   BACLOFEN (LIORESAL) 10 MG TABLET    Take 5 mg by mouth 3 (three) times daily.   BISACODYL (DULCOLAX) 10 MG SUPPOSITORY    Place 10 mg rectally daily as needed for moderate constipation.   CHOLECALCIFEROL (VITAMIN D) 1000 UNITS TABLET    Take 1,000 Units by mouth daily.   CLOPIDOGREL (PLAVIX) 75 MG TABLET    Take 75 mg by mouth at bedtime.   DULOXETINE (CYMBALTA) 60 MG CAPSULE    Take 60 mg by mouth daily.   FENOFIBRATE (TRICOR) 48 MG TABLET    Take 48 mg by mouth daily.   FERROUS SULFATE 325 (65 FE) MG TABLET    Take 325 mg by mouth daily with breakfast.   GABAPENTIN (NEURONTIN) 100 MG CAPSULE    Take 1 capsule (100 mg total) by mouth at bedtime.   HYDRALAZINE (APRESOLINE) 50 MG TABLET    Take 50 mg by mouth every 8 (eight) hours.    INSULIN ASPART (NOVOLOG FLEXPEN) 100 UNIT/ML FLEXPEN    Give 5 units SQ with meals.  Give an additional 5 units for CBG over 150 with meals.   INSULIN DETEMIR (LEVEMIR FLEXPEN) 100 UNIT/ML PEN    Inject 20 Units into the skin daily.    LORATADINE (CLARITIN) 10 MG TABLET    Take 10 mg by mouth daily.   LORAZEPAM (ATIVAN) 0.5 MG TABLET    Take 1/2 tablet(0.25 mg) by mouth every morning for anxiety(control). Take 1 tablet by mouth at bedtime for rest(control).   MAGNESIUM HYDROXIDE (MILK OF MAGNESIA) 800 MG/5ML SUSPENSION    Take 30 mLs by mouth daily as needed for constipation. 30cc   MENTHOL, TOPICAL ANALGESIC, (BIOFREEZE) 4 % GEL    To right  ankle and bilateral knees TID   MULTIPLE VITAMINS-MINERALS (MULTI VITAMIN/MINERALS) TABS    Take 1 tablet by mouth daily.   NITROFURANTOIN, MACROCRYSTAL-MONOHYDRATE, (MACROBID) 100 MG CAPSULE    Take 100 mg by mouth at bedtime.   OMEGA-3 ACID ETHYL ESTERS (LOVAZA) 1 G CAPSULE    Take 2 g by mouth 2 (two) times daily.   OMEPRAZOLE (PRILOSEC) 20 MG CAPSULE    Take 20 mg by mouth 2 (two) times daily with a meal.   PRAVASTATIN (PRAVACHOL) 40 MG TABLET    Take 40 mg by mouth daily.    PROMETHAZINE (PHENERGAN) 25 MG TABLET    Take either by mouth, IM injection, or suppository every 6 hours PRN for nausea or vomiting.   PROPYLENE GLYCOL (SYSTANE BALANCE) 0.6 % SOLN    Place 1 drop into both eyes 2 (two) times daily.   SENNOSIDES-DOCUSATE SODIUM (SENOKOT-S) 8.6-50 MG TABLET    Take 2 tablets by mouth 2 (two) times daily.    SOLIFENACIN (VESICARE) 5 MG TABLET    Take 5 mg by mouth daily.   TRAMADOL (ULTRAM) 50 MG TABLET    Take 50 mg by mouth every 8 (eight) hours as needed for moderate pain.   TRAZODONE (DESYREL) 50 MG TABLET    Take 25 mg by mouth at bedtime.   VITAMIN C (ASCORBIC ACID) 500 MG TABLET    Take 500 mg by mouth 2 (two) times daily.    WHEAT DEXTRIN (BENEFIBER) POWD    Take 1 scoop by mouth daily. Mix with 8oz of fluid  Modified Medications   No medications on file  Discontinued Medications   No medications on file     Physical  Exam: Vitals:   11/02/15 1301  BP: 128/73  Pulse: 88  Resp: 18  Temp: 98.7 F (37.1 C)  Weight: 154 lb (69.9 kg)  Height: 5\' 3"  (1.6 m)    Physical Exam  Constitutional: She is oriented to person, place, and time. She appears well-developed and well-nourished. No distress.  HENT:  Head: Normocephalic and atraumatic.  Eyes: Pupils are equal, round, and reactive to light.  Neck: Normal range of motion. Neck supple.  Cardiovascular: Normal rate and regular rhythm.   Murmur heard. Pulmonary/Chest: Effort normal and breath sounds normal. No respiratory distress.  Abdominal: Soft. Bowel sounds are normal. She exhibits no distension. There is no tenderness.  Rounded abdomen   Musculoskeletal: She exhibits no edema or tenderness.  Right sided hemiparesis   Lymphadenopathy:    She has no cervical adenopathy.  Neurological: She is alert and oriented to person, place, and time.  Skin: Skin is warm and dry. She is not diaphoretic.  Red, excoriated labia with yeast   Psychiatric: She has a normal mood and affect.    Labs reviewed: Basic Metabolic Panel:  Recent Labs  06/07/15 0918  09/14/15 10/12/15 1028 10/12/15 1145  NA 135  < > 136* 128* 128*  K 3.9  < > 4.5 4.9 4.7  CL 99*  --   --  95* 94*  CO2 23  --   --  24 24  GLUCOSE 188*  --   --  463* 413*  BUN 21*  < > 31* 26* 26*  CREATININE 1.47*  < > 1.7* 1.52* 1.51*  CALCIUM 9.8  --   --  9.9 9.9  < > = values in this interval not displayed. Liver Function Tests:  Recent Labs  06/05/15 0510 06/06/15 0615 10/12/15 1028  AST 19  20 18  ALT 14 13* 23  ALKPHOS 51 50 71  BILITOT 0.3 0.4 0.6  PROT 6.9 6.6 6.8  ALBUMIN 3.1* 2.9* 3.2*   No results for input(s): LIPASE, AMYLASE in the last 8760 hours. No results for input(s): AMMONIA in the last 8760 hours. CBC:  Recent Labs  10/12/15 1028 10/12/15 1145 10/19/15 1408  WBC 7.3 7.3 8.5  NEUTROABS 5.3 4.9 6.3  HGB 10.2* 10.0* 10.1*  HCT 32.1* 31.4* 30.9*  MCV 95.5  95.2 93.2  PLT 229 233 280   TSH: No results for input(s): TSH in the last 8760 hours. A1C: Lab Results  Component Value Date   HGBA1C 9.2 (H) 10/12/2015   Lipid Panel:  Recent Labs  11/03/14 02/08/15  CHOL 146 143  HDL 35 39  LDLCALC 59 46  TRIG 259* 291*    Assessment/Plan 1. DM (diabetes mellitus), type 2 with neurological complications ([place]) Still not well controlled, levemir has been titrated multiple times without effects. Pts blood sugars remain 300-400 fasting and has had multiple highs. Discussed dietary compliance.  Will dc levemir and start toujeo at this time Cont on novolog with meals  2. Diabetic polyneuropathy associated with type 2 diabetes mellitus (HCC) Stable on gabapentin.   3. Spastic hemiplegia affecting dominant side (HCC) Stable, conts on baclofen will cont to have occasional muscle spasms.   4. Iron deficiency anemia due to chronic blood loss Hgb stable, conts on iron supplements   5. Carotid stenosis, bilateral -following with IR. Angioplasty was scheduled for 10/17/15 but required cardiology clearance prior to procedure. Recently had echo.   6. Essential hypertension Blood pressure stable, conts on norvasc and hydralazine   7. Yeast vaginitis Diflucan 100 mg PO daily for 3 days Nystatin ointment 100000 units topically to labia BID x 14 days    Analei Whinery K. Harle Battiest  [facility] [phone] 8 am - 5 pm) [phone] (after hours)

## 2015-11-13 ENCOUNTER — Other Ambulatory Visit: Payer: Self-pay | Admitting: Cardiology

## 2015-11-13 DIAGNOSIS — I350 Nonrheumatic aortic (valve) stenosis: Secondary | ICD-10-CM

## 2015-11-20 ENCOUNTER — Ambulatory Visit (HOSPITAL_COMMUNITY)
Admission: RE | Admit: 2015-11-20 | Discharge: 2015-11-20 | Disposition: A | Discharge status: Home / Self Care | Payer: Medicare Other | Source: Ambulatory Visit | Attending: Internal Medicine | Admitting: Internal Medicine

## 2015-11-20 DIAGNOSIS — Z01810 Encounter for preprocedural cardiovascular examination: Secondary | ICD-10-CM | POA: Diagnosis not present

## 2015-11-20 DIAGNOSIS — I350 Nonrheumatic aortic (valve) stenosis: Secondary | ICD-10-CM

## 2015-11-20 DIAGNOSIS — E119 Type 2 diabetes mellitus without complications: Secondary | ICD-10-CM | POA: Insufficient documentation

## 2015-11-20 DIAGNOSIS — I6523 Occlusion and stenosis of bilateral carotid arteries: Secondary | ICD-10-CM | POA: Diagnosis not present

## 2015-11-20 DIAGNOSIS — Z87891 Personal history of nicotine dependence: Secondary | ICD-10-CM | POA: Diagnosis not present

## 2015-11-20 DIAGNOSIS — I059 Rheumatic mitral valve disease, unspecified: Secondary | ICD-10-CM | POA: Diagnosis not present

## 2015-11-20 DIAGNOSIS — I119 Hypertensive heart disease without heart failure: Secondary | ICD-10-CM | POA: Diagnosis not present

## 2015-11-20 NOTE — Progress Notes (Signed)
  Echocardiogram 2D Echocardiogram has been performed.  Chelsey Gallagher 11/20/2015, 4:54 PM

## 2015-11-22 DIAGNOSIS — M6281 Muscle weakness (generalized): Secondary | ICD-10-CM | POA: Diagnosis not present

## 2015-11-22 DIAGNOSIS — I69351 Hemiplegia and hemiparesis following cerebral infarction affecting right dominant side: Secondary | ICD-10-CM | POA: Diagnosis not present

## 2015-11-23 ENCOUNTER — Encounter: Payer: Self-pay | Admitting: Nurse Practitioner

## 2015-11-23 ENCOUNTER — Non-Acute Institutional Stay (SKILLED_NURSING_FACILITY): Payer: Medicare Other | Admitting: Nurse Practitioner

## 2015-11-23 DIAGNOSIS — B3731 Acute candidiasis of vulva and vagina: Secondary | ICD-10-CM

## 2015-11-23 DIAGNOSIS — R300 Dysuria: Secondary | ICD-10-CM

## 2015-11-23 DIAGNOSIS — B373 Candidiasis of vulva and vagina: Secondary | ICD-10-CM

## 2015-11-23 DIAGNOSIS — E1149 Type 2 diabetes mellitus with other diabetic neurological complication: Secondary | ICD-10-CM | POA: Diagnosis not present

## 2015-11-23 NOTE — Progress Notes (Signed)
Patient ID: Chelsey Gallagher, female   DOB: 04/07/1950, 65 y.o.   MRN: [id number]    Nursing Home Location:  [facility]  Place of Service: SNF (31)  PCP: [name], MD  Allergies  Allergen Reactions  . Flexeril [Cyclobenzaprine Hcl] Anaphylaxis  . Duloxetine Hcl Other (See Comments)    UNSPECIFIED   . Lyrica [Pregabalin] Other (See Comments)    "wires my up"  . Codeine Nausea And Vomiting    Chief Complaint  Patient presents with  . Acute Visit    Burning with urination    HPI:  Patient is a 65 y.o. female seen today at [facility] for pain with urination pt with a hx of CVA with spastic hemiplegia, constipation, anxiety, DM, HTN, and hyperlipidemia.  Pt noted to have yeast at her routine follow up. This was treated with nystatin ointment and diflucan. No longer hurts when she wipes. Now complaining that she is having severe pain when she urinates to the point of tears. Hurts in her lower abdomen and will go around to her low back. No fevers or chills. No blood in her urine.  Fasting blood sugars have been in the high 200s up to 581 this morning. Pt was changed from levemir to tresiba to see if blood sugars would respond better Pt also taking novolog meal coverage Review of Systems:  Review of Systems  Constitutional: Negative for activity change, appetite change, fatigue and unexpected weight change.  HENT: Negative for congestion, hearing loss and sinus pressure.   Eyes: Negative.   Respiratory: Negative for cough and shortness of breath.   Cardiovascular: Negative for chest pain, palpitations and leg swelling.  Gastrointestinal: Positive for constipation (controlled on medications). Negative for abdominal pain and diarrhea.  Genitourinary: Positive for dysuria, frequency (vesicare helps) and pelvic pain. Negative for difficulty urinating.  Musculoskeletal: Negative for arthralgias.  Skin: Negative for color change and wound.  Neurological: Negative for  dizziness and weakness. Numbness: right side.  Psychiatric/Behavioral: Negative for behavioral problems and confusion. The patient is nervous/anxious (well controlled on current regimen).     Past Medical History:  Diagnosis Date  . Aortic stenosis    moderate AS 2014 echo  . Arthritis   . Chronic GI bleeding    presumed/notes 06/06/2015  . Complication of anesthesia    doesn't wake up good- "usually end up in ICU" also experiences post op nausea and vomiting  . Constipation   . Depression   . Diabetes mellitus   . Diabetic neuropathy (HCC)    Diabetic neuropathy- has inproved since she quit smoking.  . Foot drop, left   . Frequent UTI   . GERD (gastroesophageal reflux disease)   . H/O hiatal hernia    second one  . Hearing loss   . Heart murmur    PCP- [facility]- No tx needed.  Marland Kitchen History of blood transfusion 06/2015  . Hyperlipidemia 05/14/2011  . Hypertension   . Iron deficiency anemia   . Lung abnormality 05/11/2011  . Nephrolithiasis   . Orthostatic hypotension    after surgery.  Marland Kitchen PAD (peripheral artery disease) ([place])    S/p bypass grafting, unclear exactly where  . Pneumonia    hx of  . Stroke ([place]) 05/04/2011   Weakness on Right. Wears leg brace.  . Urinary incontinence    Past Surgical History:  Procedure Laterality Date  . ABDOMINAL HYSTERECTOMY  2003  . ABDOMINAL HYSTERECTOMY    . APPENDECTOMY  2011  .  BYPASS GRAFT  2003   Abdominal aortic  . CAROTID STENT INSERTION Right 2013   ICA/notes 06/06/2015  . CESAREAN SECTION  1984  . CHOLECYSTECTOMY  1984  . COLONOSCOPY N/A 05/10/2013   Procedure: COLONOSCOPY;  Surgeon: Lafayette Dragon, MD;  Location: [facility] ENDOSCOPY;  Service: Endoscopy;  Laterality: N/A;  . ESOPHAGOGASTRODUODENOSCOPY N/A 05/09/2013   Procedure: ESOPHAGOGASTRODUODENOSCOPY (EGD);  Surgeon: Lafayette Dragon, MD;  Location: [facility] ENDOSCOPY;  Service: Endoscopy;  Laterality: N/A;  . HERNIA REPAIR  2011  . JOINT REPLACEMENT Right   . [place]  . LITHOTRIPSY    . TOTAL HIP ARTHROPLASTY  2008   Right   Social History:   reports that she has quit smoking. Her smoking use included Cigarettes. She smoked 0.00 packs per day for 45.00 years. She quit smokeless tobacco use about 4 years ago. She reports that she does not drink alcohol or use drugs.  Family History  Problem Relation Age of Onset  . Cancer Maternal Aunt     breast    Medications: Patient's Medications  New Prescriptions   No medications on file  Previous Medications   ACETAMINOPHEN (TYLENOL) 325 MG TABLET    Take 650 mg by mouth every 6 (six) hours as needed for pain.   ALBUTEROL (PROVENTIL) (2.5 MG/3ML) 0.083% NEBULIZER SOLUTION    Take 3 mLs (2.5 mg total) by nebulization every 4 (four) hours as needed for wheezing or shortness of breath.   AMINO ACIDS-PROTEIN HYDROLYS (FEEDING SUPPLEMENT, PRO-STAT SUGAR FREE 64,) LIQD    Take 30 mLs by mouth daily. At 1700   AMLODIPINE (NORVASC) 5 MG TABLET    Take 1 tablet (5 mg total) by mouth daily.   ASPIRIN 81 MG CHEWABLE TABLET    Chew 81 mg by mouth daily.   BACLOFEN (LIORESAL) 10 MG TABLET    Take 5 mg by mouth 3 (three) times daily.   BISACODYL (DULCOLAX) 10 MG SUPPOSITORY    Place 10 mg rectally daily as needed for moderate constipation.   CHOLECALCIFEROL (VITAMIN D) 1000 UNITS TABLET    Take 1,000 Units by mouth daily.   CLOPIDOGREL (PLAVIX) 75 MG TABLET    Take 75 mg by mouth at bedtime.   DULOXETINE (CYMBALTA) 60 MG CAPSULE    Take 60 mg by mouth daily.   FENOFIBRATE (TRICOR) 48 MG TABLET    Take 48 mg by mouth daily.   GABAPENTIN (NEURONTIN) 100 MG CAPSULE    Take 1 capsule (100 mg total) by mouth at bedtime.   HYDRALAZINE (APRESOLINE) 50 MG TABLET    Take 50 mg by mouth every 8 (eight) hours.    INSULIN ASPART (NOVOLOG FLEXPEN) 100 UNIT/ML FLEXPEN    Give 5 units SQ with meals. Give an additional 5 units for CBG over 150 with meals.   INSULIN DETEMIR (LEVEMIR FLEXPEN) 100 UNIT/ML PEN    Inject 20  Units into the skin daily.    LORATADINE (CLARITIN) 10 MG TABLET    Take 10 mg by mouth daily.   LORAZEPAM (ATIVAN) 0.5 MG TABLET    Take 1/2 tablet(0.25 mg) by mouth every morning for anxiety(control). Take 1 tablet by mouth at bedtime for rest(control).   MAGNESIUM HYDROXIDE (MILK OF MAGNESIA) 800 MG/5ML SUSPENSION    Take 30 mLs by mouth daily as needed for constipation. 30cc   MENTHOL, TOPICAL ANALGESIC, (BIOFREEZE) 4 % GEL    To right ankle and bilateral knees TID   MULTIPLE VITAMINS-MINERALS (MULTI  VITAMIN/MINERALS) TABS    Take 1 tablet by mouth daily.   NITROFURANTOIN, MACROCRYSTAL-MONOHYDRATE, (MACROBID) 100 MG CAPSULE    Take 100 mg by mouth at bedtime.   OMEGA-3 ACID ETHYL ESTERS (LOVAZA) 1 G CAPSULE    Take 2 g by mouth 2 (two) times daily.   OMEPRAZOLE (PRILOSEC) 20 MG CAPSULE    Take 20 mg by mouth 2 (two) times daily with a meal.   PRAVASTATIN (PRAVACHOL) 40 MG TABLET    Take 40 mg by mouth daily.    PROMETHAZINE (PHENERGAN) 25 MG TABLET    Take either by mouth, IM injection, or suppository every 6 hours PRN for nausea or vomiting.   PROPYLENE GLYCOL (SYSTANE BALANCE) 0.6 % SOLN    Place 1 drop into both eyes 2 (two) times daily.   SENNOSIDES-DOCUSATE SODIUM (SENOKOT-S) 8.6-50 MG TABLET    Take 2 tablets by mouth 2 (two) times daily.    SOLIFENACIN (VESICARE) 5 MG TABLET    Take 5 mg by mouth daily.   TRAMADOL (ULTRAM) 50 MG TABLET    Take 50 mg by mouth every 8 (eight) hours as needed for moderate pain.   TRAZODONE (DESYREL) 50 MG TABLET    Take 25 mg by mouth at bedtime.   VITAMIN C (ASCORBIC ACID) 500 MG TABLET    Take 500 mg by mouth 2 (two) times daily.    WHEAT DEXTRIN (BENEFIBER) POWD    Take 1 scoop by mouth daily. Mix with 8oz of fluid  Modified Medications   No medications on file  Discontinued Medications   FERROUS SULFATE 325 (65 FE) MG TABLET    Take 325 mg by mouth daily with breakfast.     Physical Exam: Vitals:   11/23/15 1519  BP: 113/62  Pulse: 100    Resp: 18  Temp: 98.8 F (37.1 C)  Weight: 156 lb (70.8 kg)  Height: 5\' 3"  (1.6 m)    Physical Exam  Constitutional: She is oriented to person, place, and time. She appears well-developed and well-nourished. No distress.  HENT:  Head: Normocephalic and atraumatic.  Eyes: Pupils are equal, round, and reactive to light.  Neck: Normal range of motion. Neck supple.  Cardiovascular: Normal rate and regular rhythm.   Murmur heard. Pulmonary/Chest: Effort normal and breath sounds normal. No respiratory distress.  Abdominal: Soft. Bowel sounds are normal. She exhibits no distension. There is no tenderness.  Rounded abdomen   Musculoskeletal: She exhibits no edema or tenderness.  Right sided hemiparesis   Lymphadenopathy:    She has no cervical adenopathy.  Neurological: She is alert and oriented to person, place, and time.  Skin: Skin is warm and dry. She is not diaphoretic.  Psychiatric: She has a normal mood and affect.    Labs reviewed: Basic Metabolic Panel:  Recent Labs  06/07/15 0918  09/14/15 10/12/15 1028 10/12/15 1145  NA 135  < > 136* 128* 128*  K 3.9  < > 4.5 4.9 4.7  CL 99*  --   --  95* 94*  CO2 23  --   --  24 24  GLUCOSE 188*  --   --  463* 413*  BUN 21*  < > 31* 26* 26*  CREATININE 1.47*  < > 1.7* 1.52* 1.51*  CALCIUM 9.8  --   --  9.9 9.9  < > = values in this interval not displayed. Liver Function Tests:  Recent Labs  06/05/15 0510 06/06/15 0615 10/12/15 1028  AST 19 20 18  ALT 14 13* 23  ALKPHOS 51 50 71  BILITOT 0.3 0.4 0.6  PROT 6.9 6.6 6.8  ALBUMIN 3.1* 2.9* 3.2*   No results for input(s): LIPASE, AMYLASE in the last 8760 hours. No results for input(s): AMMONIA in the last 8760 hours. CBC:  Recent Labs  10/12/15 1028 10/12/15 1145 10/19/15 1408  WBC 7.3 7.3 8.5  NEUTROABS 5.3 4.9 6.3  HGB 10.2* 10.0* 10.1*  HCT 32.1* 31.4* 30.9*  MCV 95.5 95.2 93.2  PLT 229 233 280   TSH: No results for input(s): TSH in the last 8760  hours. A1C: Lab Results  Component Value Date   HGBA1C 9.2 (H) 10/12/2015   Lipid Panel:  Recent Labs  02/08/15  CHOL 143  HDL 39  LDLCALC 46  TRIG 291*    Assessment/Plan 1. Dysuria Yeast has been treated but pt conts to have increase in burning with urination with abdominal pain. Will have staff collect urine via I&O cath  2. DM (diabetes mellitus), type 2 with neurological complications ([place]) Remain uncontrolled, will ncrease tresiba to 25 units sq hs. Will increase novolog to 10 units with meals and to give additional 5 units for blood sugar greater than 150.   3. Yeast vaginitis improved   Jessica K. Harle Battiest  [facility] [phone] 8 am - 5 pm) [phone] (after hours)

## 2015-11-24 DIAGNOSIS — Z87440 Personal history of urinary (tract) infections: Secondary | ICD-10-CM | POA: Diagnosis not present

## 2015-11-30 DIAGNOSIS — N390 Urinary tract infection, site not specified: Secondary | ICD-10-CM | POA: Diagnosis not present

## 2015-12-07 DIAGNOSIS — Z23 Encounter for immunization: Secondary | ICD-10-CM | POA: Diagnosis not present

## 2015-12-12 ENCOUNTER — Non-Acute Institutional Stay (SKILLED_NURSING_FACILITY): Payer: Medicare Other | Admitting: Nurse Practitioner

## 2015-12-12 ENCOUNTER — Encounter: Payer: Self-pay | Admitting: Nurse Practitioner

## 2015-12-12 DIAGNOSIS — K5909 Other constipation: Secondary | ICD-10-CM | POA: Diagnosis not present

## 2015-12-12 DIAGNOSIS — R300 Dysuria: Secondary | ICD-10-CM

## 2015-12-12 DIAGNOSIS — E1149 Type 2 diabetes mellitus with other diabetic neurological complication: Secondary | ICD-10-CM | POA: Diagnosis not present

## 2015-12-12 DIAGNOSIS — I6523 Occlusion and stenosis of bilateral carotid arteries: Secondary | ICD-10-CM

## 2015-12-12 DIAGNOSIS — D500 Iron deficiency anemia secondary to blood loss (chronic): Secondary | ICD-10-CM | POA: Diagnosis not present

## 2015-12-12 NOTE — Progress Notes (Signed)
Patient ID: Chelsey Gallagher, female   DOB: Jun 16, 1950, 65 y.o.   MRN: [id number]    Nursing Home Location:  [facility]  Place of Service: SNF (31)  PCP: [name], MD  Allergies  Allergen Reactions  . Flexeril [Cyclobenzaprine Hcl] Anaphylaxis  . Duloxetine Hcl Other (See Comments)    UNSPECIFIED   . Lyrica [Pregabalin] Other (See Comments)    "wires my up"  . Codeine Nausea And Vomiting    Chief Complaint  Patient presents with  . Medical Management of Chronic Issues    Routine Visit    HPI:  Patient is a 65 y.o. female seen today at [facility] for routine follow up. Pt with a hx of CVA with spastic hemiplegia, constipation, anxiety, DM, HTN, and hyperlipidemia.  Pt has been cleared by cardiology for carotid stenting and plans to under procedure next week. Pt reports ongoing burning with urination. On macrobid BID for prophylactic. No abdominal pain or fevers.  Constipation is better since she is off iron.    Review of Systems:  Review of Systems  Constitutional: Negative for activity change, appetite change, fatigue and unexpected weight change.  HENT: Negative for congestion, hearing loss and sinus pressure.   Eyes: Negative.   Respiratory: Negative for cough and shortness of breath.   Cardiovascular: Negative for chest pain, palpitations and leg swelling.  Gastrointestinal: Positive for constipation (controlled on medications). Negative for abdominal pain, diarrhea and nausea.  Genitourinary: Positive for dysuria and frequency (vesicare helps). Negative for difficulty urinating, pelvic pain and vaginal pain.  Musculoskeletal: Negative for arthralgias.  Skin: Negative for color change and wound.  Neurological: Negative for dizziness and weakness. Numbness: right side.  Psychiatric/Behavioral: Negative for behavioral problems and confusion. The patient is nervous/anxious (well controlled on current regimen).     Past Medical History:  Diagnosis Date   . Aortic stenosis    moderate AS 2014 echo  . Arthritis   . Chronic GI bleeding    presumed/notes 06/06/2015  . Complication of anesthesia    doesn't wake up good- "usually end up in ICU" also experiences post op nausea and vomiting  . Constipation   . Depression   . Diabetes mellitus   . Diabetic neuropathy (HCC)    Diabetic neuropathy- has inproved since she quit smoking.  . Foot drop, left   . Frequent UTI   . GERD (gastroesophageal reflux disease)   . H/O hiatal hernia    second one  . Hearing loss   . Heart murmur    PCP- [facility]- No tx needed.  Marland Kitchen History of blood transfusion 06/2015  . Hyperlipidemia 05/14/2011  . Hypertension   . Iron deficiency anemia   . Lung abnormality 05/11/2011  . Nephrolithiasis   . Orthostatic hypotension    after surgery.  Marland Kitchen PAD (peripheral artery disease) ([place])    S/p bypass grafting, unclear exactly where  . Pneumonia    hx of  . Stroke ([place]) 05/04/2011   Weakness on Right. Wears leg brace.  . Urinary incontinence    Past Surgical History:  Procedure Laterality Date  . ABDOMINAL HYSTERECTOMY  2003  . ABDOMINAL HYSTERECTOMY    . APPENDECTOMY  2011  . BYPASS GRAFT  2003   Abdominal aortic  . CAROTID STENT INSERTION Right 2013   ICA/notes 06/06/2015  . CESAREAN SECTION  1984  . CHOLECYSTECTOMY  1984  . COLONOSCOPY N/A 05/10/2013   Procedure: COLONOSCOPY;  Surgeon: Lafayette Dragon, MD;  Location:  [facility];  Service: Endoscopy;  Laterality: N/A;  . ESOPHAGOGASTRODUODENOSCOPY N/A 05/09/2013   Procedure: ESOPHAGOGASTRODUODENOSCOPY (EGD);  Surgeon: Lafayette Dragon, MD;  Location: [facility] END[facility]COPY;  Service: Endoscopy;  Laterality: N/A;  . HERNIA REPAIR  2011  . JOINT REPLACEMENT Right   . [place]  . LITHOTRIPSY    . TOTAL HIP ARTHROPLASTY  2008   Right   Social History:   reports that she has quit smoking. Her smoking use included Cigarettes. She smoked 0.00 packs per day for 45.00 years. She quit smokeless  tobacco use about 4 years ago. She reports that she does not drink alcohol or use drugs.  Family History  Problem Relation Age of Onset  . Cancer Maternal Aunt     breast    Medications: Patient's Medications  New Prescriptions   No medications on file  Previous Medications   ACETAMINOPHEN (TYLENOL) 325 MG TABLET    Take 650 mg by mouth every 6 (six) hours as needed for pain.   ALBUTEROL (PROVENTIL) (2.5 MG/3ML) 0.083% NEBULIZER SOLUTION    Take 3 mLs (2.5 mg total) by nebulization every 4 (four) hours as needed for wheezing or shortness of breath.   AMINO ACIDS-PROTEIN HYDROLYS (FEEDING SUPPLEMENT, PRO-STAT SUGAR FREE 64,) LIQD    Take 30 mLs by mouth daily. At 1700   AMLODIPINE (NORVASC) 5 MG TABLET    Take 1 tablet (5 mg total) by mouth daily.   ASPIRIN 81 MG CHEWABLE TABLET    Chew 81 mg by mouth daily.   BACLOFEN (LIORESAL) 10 MG TABLET    Take 5 mg by mouth 3 (three) times daily.   BISACODYL (DULCOLAX) 10 MG SUPPOSITORY    Place 10 mg rectally daily as needed for moderate constipation.   CHOLECALCIFEROL (VITAMIN D) 1000 UNITS TABLET    Take 1,000 Units by mouth daily.   CLOPIDOGREL (PLAVIX) 75 MG TABLET    Take 75 mg by mouth at bedtime.   DULOXETINE (CYMBALTA) 60 MG CAPSULE    Take 60 mg by mouth daily.   FENOFIBRATE (TRICOR) 48 MG TABLET    Take 48 mg by mouth daily.   GABAPENTIN (NEURONTIN) 100 MG CAPSULE    Take 1 capsule (100 mg total) by mouth at bedtime.   HYDRALAZINE (APRESOLINE) 50 MG TABLET    Take 50 mg by mouth every 8 (eight) hours.    INSULIN ASPART (NOVOLOG FLEXPEN) 100 UNIT/ML FLEXPEN    Give 10 units SQ with meals. Give an additional 5 units for CBG over 150 with meals.   INSULIN DEGLUDEC (TRESIBA FLEXTOUCH) 100 UNIT/ML SOPN FLEXTOUCH PEN    Inject 25 Units into the skin at bedtime.   INSULIN DETEMIR (LEVEMIR FLEXPEN) 100 UNIT/ML PEN    Inject 20 Units into the skin daily.    LORATADINE (CLARITIN) 10 MG TABLET    Take 10 mg by mouth daily.   LORAZEPAM (ATIVAN) 0.5  MG TABLET    Take 1/2 tablet(0.25 mg) by mouth every morning for anxiety(control). Take 1 tablet by mouth at bedtime for rest(control).   MAGNESIUM HYDROXIDE (MILK OF MAGNESIA) 800 MG/5ML SUSPENSION    Take 30 mLs by mouth daily as needed for constipation. 30cc   MENTHOL, TOPICAL ANALGESIC, (BIOFREEZE) 4 % GEL    To right ankle and bilateral knees TID   MULTIPLE VITAMINS-MINERALS (MULTI VITAMIN/MINERALS) TABS    Take 1 tablet by mouth daily.   NITROFURANTOIN, MACROCRYSTAL-MONOHYDRATE, (MACROBID) 100 MG CAPSULE    Take 100 mg  by mouth at bedtime.   NYSTATIN (MYCOSTATIN/NYSTOP) POWDER    Apply to groin three times daily as needed for rash, redness, or itching.   OMEGA-3 ACID ETHYL ESTERS (LOVAZA) 1 G CAPSULE    Take 2 g by mouth 2 (two) times daily.   OMEPRAZOLE (PRILOSEC) 20 MG CAPSULE    Take 20 mg by mouth 2 (two) times daily with a meal.   PRAVASTATIN (PRAVACHOL) 40 MG TABLET    Take 40 mg by mouth daily.    PROMETHAZINE (PHENERGAN) 25 MG TABLET    Take either by mouth, IM injection, or suppository every 6 hours PRN for nausea or vomiting.   PROPYLENE GLYCOL (SYSTANE BALANCE) 0.6 % SOLN    Place 1 drop into both eyes 2 (two) times daily.   SENNOSIDES-DOCUSATE SODIUM (SENOKOT-S) 8.6-50 MG TABLET    Take 2 tablets by mouth 2 (two) times daily.    SOLIFENACIN (VESICARE) 5 MG TABLET    Take 5 mg by mouth daily.   TRAMADOL (ULTRAM) 50 MG TABLET    Take 50 mg by mouth every 8 (eight) hours as needed for moderate pain.   TRAZODONE (DESYREL) 50 MG TABLET    Take 25 mg by mouth at bedtime.   VITAMIN C (ASCORBIC ACID) 500 MG TABLET    Take 500 mg by mouth 2 (two) times daily.    WHEAT DEXTRIN (BENEFIBER) POWD    Take 1 scoop by mouth daily. Mix with 8oz of fluid  Modified Medications   No medications on file  Discontinued Medications   No medications on file     Physical Exam: Vitals:   12/12/15 1126  BP: 106/68  Pulse: 92  Resp: 20  Temp: 97 F (36.1 C)  SpO2: 99%  Weight: 155 lb (70.3 kg)    Height: 5\' 3"  (1.6 m)    Physical Exam  Constitutional: She is oriented to person, place, and time. She appears well-developed and well-nourished. No distress.  HENT:  Head: Normocephalic and atraumatic.  Eyes: Pupils are equal, round, and reactive to light.  Neck: Normal range of motion. Neck supple.  Cardiovascular: Normal rate and regular rhythm.   Murmur heard. Pulmonary/Chest: Effort normal and breath sounds normal. No respiratory distress.  Abdominal: Soft. Bowel sounds are normal. She exhibits no distension. There is no tenderness.  Rounded abdomen   Musculoskeletal: She exhibits no edema or tenderness.  Right sided hemiparesis   Lymphadenopathy:    She has no cervical adenopathy.  Neurological: She is alert and oriented to person, place, and time.  Skin: Skin is warm and dry. She is not diaphoretic.  Psychiatric: She has a normal mood and affect.    Labs reviewed: Basic Metabolic Panel:  Recent Labs  06/07/15 0918  09/14/15 10/12/15 1028 10/12/15 1145  NA 135  < > 136* 128* 128*  K 3.9  < > 4.5 4.9 4.7  CL 99*  --   --  95* 94*  CO2 23  --   --  24 24  GLUCOSE 188*  --   --  463* 413*  BUN 21*  < > 31* 26* 26*  CREATININE 1.47*  < > 1.7* 1.52* 1.51*  CALCIUM 9.8  --   --  9.9 9.9  < > = values in this interval not displayed. Liver Function Tests:  Recent Labs  06/05/15 0510 06/06/15 0615 10/12/15 1028  AST 19 20 18   ALT 14 13* 23  ALKPHOS 51 50 71  BILITOT 0.3 0.4 0.6  PROT 6.9 6.6 6.8  ALBUMIN 3.1* 2.9* 3.2*   No results for input(s): LIPASE, AMYLASE in the last 8760 hours. No results for input(s): AMMONIA in the last 8760 hours. CBC:  Recent Labs  10/12/15 1028 10/12/15 1145 10/19/15 1408  WBC 7.3 7.3 8.5  NEUTROABS 5.3 4.9 6.3  HGB 10.2* 10.0* 10.1*  HCT 32.1* 31.4* 30.9*  MCV 95.5 95.2 93.2  PLT 229 233 280   TSH: No results for input(s): TSH in the last 8760 hours. A1C: Lab Results  Component Value Date   HGBA1C 9.2 (H)  10/12/2015   Lipid Panel:  Recent Labs  02/08/15  CHOL 143  HDL 39  LDLCALC 46  TRIG 291*    Assessment/Plan 1. Chronic constipation Improved since off iron, will cont current regimen.   2. Bilateral carotid artery stenosis Vascular following pt at this time. conts on ASA only due to chronic GI bleed.   3. DM (diabetes mellitus), type 2 with neurological complications (HCC) Blood sugars ranging from 150-300, currently taking tresiba 25 units daily -encouraged lifestyle modifications.   4. Iron deficiency anemia due to chronic blood loss Off iron, will follow up CBC next month  5. Dysuria Chronic dysuira, no fever, chills, abdominal pain. Order written for [facility] C&S however not on chart. Nursing to follow up on this.    Carlos American. Harle Battiest  [facility] [phone] 8 am - 5 pm) [phone] (after hours)

## 2015-12-14 ENCOUNTER — Encounter (HOSPITAL_COMMUNITY): Payer: Self-pay | Admitting: *Deleted

## 2015-12-14 ENCOUNTER — Other Ambulatory Visit: Payer: Self-pay | Admitting: Physician Assistant

## 2015-12-14 NOTE — Progress Notes (Signed)
Pt is a resident at [facility]. Spoke with Kyung Rudd, nurse for pt, for pre-op call. He states pt is alert and oriented, able to speak for herself. Pt is diabetic. He states her fasting blood sugar is usually around 200. Faxed pre-op instructions to [facility] at [phone].

## 2015-12-14 NOTE — Pre-Procedure Instructions (Signed)
    Chelsey Gallagher  12/14/2015     Chelsey Gallagher procedure is scheduled on Monday, December 17, 2015 at 8:30 AM.    Report to [facility] Entrance "A" Admitting Office at 6:00 AM.   Call this number if you have problems the morning of surgery: [phone]    Remember:  Chelsey Gallagher is not to eat food or drink liquids after midnight Sunday, 12/16/15.  Take these medicines the morning of surgery with A SIP OF WATER: Amlodipine (Norvasc), Aspirin, Duloxetine (Cymbalta), Lorazepam (Ativan), Omeprazole (Prilosec), Tramadol - if needed, Albuterol nebulizer if needed   How to Manage Your Diabetes Before Surgery   Why is it important to control my blood sugar before and after surgery?   Improving blood sugar levels before and after surgery helps healing and can limit problems.  A way of improving blood sugar control is eating a healthy diet by:  - Eating less sugar and carbohydrates  - Increasing activity/exercise  - Talk with your doctor about reaching your blood sugar goals  High blood sugars (greater than 180 mg/dL) can raise your risk of infections and slow down your recovery so you will need to focus on controlling your diabetes during the weeks before surgery.  Make sure that the doctor who takes care of your diabetes knows about your planned surgery including the date and location.  How do I manage my blood sugars before surgery?   Check your blood sugar at least 4 times a day, 2 days before surgery to make sure that they are not too high or low.  Check your blood sugar the morning of your surgery when you wake up and every 2 hours until you get to the Short-Stay unit.  Treat a low blood sugar (less than 70 mg/dL) with 1/2 cup of clear juice (cranberry or apple), 4 glucose tablets, OR glucose gel.  Recheck blood sugar in 15 minutes after treatment (to make sure it is greater than 70 mg/dL).  If blood sugar is not greater than 70 mg/dL on re-check, call [phone]  for further instructions.   Report your blood sugar to the Short-Stay nurse when you get to Short-Stay.  References:  [facility], 2007 "How to Manage your Diabetes Before and After Surgery".  What do I do about my diabetes medications?   THE NIGHT BEFORE SURGERY, take 12 units of Tresiba Insulin.   THE MORNING OF SURGERY, do not use Novolog flexpen.     Do not wear jewelry, make-up or nail polish.  Do not wear lotions, powders, or perfumes.  Do not shave 48 hours prior to surgery.    Do not bring valuables to the hospital.  [facility] is not responsible for any belongings or valuables.  Contacts, dentures or bridgework may not be worn into surgery.

## 2015-12-16 NOTE — Anesthesia Preprocedure Evaluation (Addendum)
Anesthesia Evaluation  Patient identified by MRN, date of birth, ID band Patient awake    Reviewed: Allergy & Precautions, H&P , NPO status , Patient's Chart, lab work & pertinent test results  Airway Mallampati: II  TM Distance: >3 FB Neck ROM: Full    Dental no notable dental hx. (+) Teeth Intact, Dental Advisory Given   Pulmonary neg pulmonary ROS, COPD,  COPD inhaler, former smoker,    Pulmonary exam normal breath sounds clear to auscultation       Cardiovascular hypertension, Pt. on medications + Peripheral Vascular Disease  + Valvular Problems/Murmurs AS  Rhythm:Regular Rate:Normal + Systolic murmurs    Neuro/Psych Anxiety Depression CVA negative psych ROS   GI/Hepatic Neg liver ROS, GERD  Medicated and Controlled,  Endo/Other  diabetes, Insulin Dependent  Renal/GU Renal disease  negative genitourinary   Musculoskeletal  (+) Arthritis , Osteoarthritis,    Abdominal   Peds  Hematology negative hematology ROS (+) anemia ,   Anesthesia Other Findings   Reproductive/Obstetrics negative OB ROS                           Anesthesia Physical Anesthesia Plan  ASA: III  Anesthesia Plan: MAC   Post-op Pain Management:    Induction: Intravenous  Airway Management Planned:   Additional Equipment:   Intra-op Plan:   Post-operative Plan:   Informed Consent: I have reviewed the patients History and Physical, chart, labs and discussed the procedure including the risks, benefits and alternatives for the proposed anesthesia with the patient or authorized representative who has indicated his/her understanding and acceptance.   Dental advisory given  Plan Discussed with: CRNA  Anesthesia Plan Comments: (Dr. Estanislado Pandy is going to proceed without anesthesia.)      Anesthesia Quick Evaluation

## 2015-12-17 ENCOUNTER — Ambulatory Visit (HOSPITAL_COMMUNITY): Payer: Medicare Other | Admitting: Anesthesiology

## 2015-12-17 ENCOUNTER — Ambulatory Visit (HOSPITAL_COMMUNITY)
Admission: RE | Admit: 2015-12-17 | Discharge: 2015-12-17 | Disposition: A | Discharge status: Home / Self Care | Payer: Medicare Other | Source: Ambulatory Visit | Attending: Interventional Radiology | Admitting: Interventional Radiology

## 2015-12-17 ENCOUNTER — Encounter (HOSPITAL_COMMUNITY): Payer: Self-pay

## 2015-12-17 ENCOUNTER — Encounter (HOSPITAL_COMMUNITY)
Admission: RE | Disposition: A | Discharge status: Home / Self Care | Payer: Self-pay | Source: Ambulatory Visit | Attending: Interventional Radiology

## 2015-12-17 ENCOUNTER — Encounter (HOSPITAL_COMMUNITY): Payer: Self-pay | Admitting: *Deleted

## 2015-12-17 DIAGNOSIS — Z888 Allergy status to other drugs, medicaments and biological substances status: Secondary | ICD-10-CM | POA: Insufficient documentation

## 2015-12-17 DIAGNOSIS — I350 Nonrheumatic aortic (valve) stenosis: Secondary | ICD-10-CM | POA: Insufficient documentation

## 2015-12-17 DIAGNOSIS — N189 Chronic kidney disease, unspecified: Secondary | ICD-10-CM | POA: Diagnosis not present

## 2015-12-17 DIAGNOSIS — I6522 Occlusion and stenosis of left carotid artery: Secondary | ICD-10-CM | POA: Diagnosis not present

## 2015-12-17 DIAGNOSIS — I6501 Occlusion and stenosis of right vertebral artery: Secondary | ICD-10-CM | POA: Insufficient documentation

## 2015-12-17 DIAGNOSIS — F419 Anxiety disorder, unspecified: Secondary | ICD-10-CM | POA: Diagnosis not present

## 2015-12-17 DIAGNOSIS — G2581 Restless legs syndrome: Secondary | ICD-10-CM | POA: Diagnosis not present

## 2015-12-17 DIAGNOSIS — I6523 Occlusion and stenosis of bilateral carotid arteries: Secondary | ICD-10-CM | POA: Diagnosis not present

## 2015-12-17 DIAGNOSIS — Z96641 Presence of right artificial hip joint: Secondary | ICD-10-CM | POA: Diagnosis not present

## 2015-12-17 DIAGNOSIS — I69351 Hemiplegia and hemiparesis following cerebral infarction affecting right dominant side: Secondary | ICD-10-CM | POA: Diagnosis not present

## 2015-12-17 DIAGNOSIS — Z794 Long term (current) use of insulin: Secondary | ICD-10-CM | POA: Diagnosis not present

## 2015-12-17 DIAGNOSIS — E1122 Type 2 diabetes mellitus with diabetic chronic kidney disease: Secondary | ICD-10-CM | POA: Insufficient documentation

## 2015-12-17 DIAGNOSIS — G8929 Other chronic pain: Secondary | ICD-10-CM | POA: Insufficient documentation

## 2015-12-17 DIAGNOSIS — F329 Major depressive disorder, single episode, unspecified: Secondary | ICD-10-CM | POA: Diagnosis not present

## 2015-12-17 DIAGNOSIS — E1140 Type 2 diabetes mellitus with diabetic neuropathy, unspecified: Secondary | ICD-10-CM | POA: Diagnosis not present

## 2015-12-17 DIAGNOSIS — K219 Gastro-esophageal reflux disease without esophagitis: Secondary | ICD-10-CM | POA: Insufficient documentation

## 2015-12-17 DIAGNOSIS — I739 Peripheral vascular disease, unspecified: Secondary | ICD-10-CM | POA: Insufficient documentation

## 2015-12-17 DIAGNOSIS — E785 Hyperlipidemia, unspecified: Secondary | ICD-10-CM | POA: Insufficient documentation

## 2015-12-17 DIAGNOSIS — I129 Hypertensive chronic kidney disease with stage 1 through stage 4 chronic kidney disease, or unspecified chronic kidney disease: Secondary | ICD-10-CM | POA: Diagnosis not present

## 2015-12-17 DIAGNOSIS — Z885 Allergy status to narcotic agent status: Secondary | ICD-10-CM | POA: Diagnosis not present

## 2015-12-17 DIAGNOSIS — Z87891 Personal history of nicotine dependence: Secondary | ICD-10-CM | POA: Insufficient documentation

## 2015-12-17 DIAGNOSIS — I671 Cerebral aneurysm, nonruptured: Secondary | ICD-10-CM | POA: Diagnosis not present

## 2015-12-17 DIAGNOSIS — J439 Emphysema, unspecified: Secondary | ICD-10-CM | POA: Diagnosis not present

## 2015-12-17 DIAGNOSIS — M1990 Unspecified osteoarthritis, unspecified site: Secondary | ICD-10-CM | POA: Diagnosis not present

## 2015-12-17 DIAGNOSIS — I771 Stricture of artery: Secondary | ICD-10-CM

## 2015-12-17 HISTORY — DX: Hemiplegia, unspecified affecting right dominant side: G81.91

## 2015-12-17 HISTORY — DX: Anxiety disorder, unspecified: F41.9

## 2015-12-17 HISTORY — DX: Restless legs syndrome: G25.81

## 2015-12-17 HISTORY — DX: Contracture, unspecified elbow: M24.529

## 2015-12-17 HISTORY — DX: Facial weakness: R29.810

## 2015-12-17 HISTORY — DX: Overactive bladder: N32.81

## 2015-12-17 HISTORY — DX: Other chronic pain: G89.29

## 2015-12-17 HISTORY — PX: RADIOLOGY WITH ANESTHESIA: SHX6223

## 2015-12-17 HISTORY — DX: Occlusion and stenosis of unspecified carotid artery: I65.29

## 2015-12-17 HISTORY — PX: IR GENERIC HISTORICAL: IMG1180011

## 2015-12-17 HISTORY — DX: Dysphagia, oropharyngeal phase: R13.12

## 2015-12-17 HISTORY — DX: Contracture, right wrist: M24.531

## 2015-12-17 HISTORY — DX: Difficulty in walking, not elsewhere classified: R26.2

## 2015-12-17 HISTORY — DX: Personal history of urinary calculi: Z87.442

## 2015-12-17 HISTORY — DX: Emphysema, unspecified: J43.9

## 2015-12-17 LAB — BASIC METABOLIC PANEL
Anion gap: 9 (ref 5–15)
BUN: 28 mg/dL — AB (ref 6–20)
CHLORIDE: 100 mmol/L — AB (ref 101–111)
CO2: 26 mmol/L (ref 22–32)
Calcium: 10.2 mg/dL (ref 8.9–10.3)
Creatinine, Ser: 1.58 mg/dL — ABNORMAL HIGH (ref 0.44–1.00)
GFR calc Af Amer: 39 mL/min — ABNORMAL LOW (ref >60)
GFR calc non Af Amer: 34 mL/min — ABNORMAL LOW (ref >60)
GLUCOSE: 175 mg/dL — AB (ref 65–99)
POTASSIUM: 4.3 mmol/L (ref 3.5–5.1)
Sodium: 135 mmol/L (ref 135–145)

## 2015-12-17 LAB — CBC WITH DIFFERENTIAL/PLATELET
Basophils Absolute: 0 10*3/uL (ref 0.0–0.1)
Basophils Relative: 0 %
Eosinophils Absolute: 0.1 10*3/uL (ref 0.0–0.7)
Eosinophils Relative: 1 %
HEMATOCRIT: 32.8 % — AB (ref 36.0–46.0)
HEMOGLOBIN: 10.2 g/dL — AB (ref 12.0–15.0)
LYMPHS ABS: 2.1 10*3/uL (ref 0.7–4.0)
LYMPHS PCT: 30 %
MCH: 29.2 pg (ref 26.0–34.0)
MCHC: 31.1 g/dL (ref 30.0–36.0)
MCV: 94 fL (ref 78.0–100.0)
Monocytes Absolute: 0.7 10*3/uL (ref 0.1–1.0)
Monocytes Relative: 10 %
NEUTROS PCT: 59 %
Neutro Abs: 4.3 10*3/uL (ref 1.7–7.7)
Platelets: 323 10*3/uL (ref 150–400)
RBC: 3.49 MIL/uL — AB (ref 3.87–5.11)
RDW: 16 % — ABNORMAL HIGH (ref 11.5–15.5)
WBC: 7.2 10*3/uL (ref 4.0–10.5)

## 2015-12-17 LAB — PROTIME-INR
INR: 0.89
Prothrombin Time: 12.1 seconds (ref 11.4–15.2)

## 2015-12-17 LAB — GLUCOSE, CAPILLARY
GLUCOSE-CAPILLARY: 164 mg/dL — AB (ref 65–99)
GLUCOSE-CAPILLARY: 165 mg/dL — AB (ref 65–99)

## 2015-12-17 LAB — APTT: aPTT: 29 seconds (ref 24–36)

## 2015-12-17 LAB — PLATELET INHIBITION P2Y12: PLATELET FUNCTION P2Y12: 270 [PRU] (ref 194–418)

## 2015-12-17 SURGERY — RADIOLOGY WITH ANESTHESIA
Anesthesia: General

## 2015-12-17 MED ORDER — IOPAMIDOL (ISOVUE-300) INJECTION 61%
INTRAVENOUS | Status: AC
  Filled 2015-12-17: qty 50

## 2015-12-17 MED ORDER — HEPARIN SODIUM (PORCINE) 1000 UNIT/ML IJ SOLN
INTRAMUSCULAR | Status: AC
Start: 2015-12-17 — End: 2015-12-17
  Filled 2015-12-17: qty 2

## 2015-12-17 MED ORDER — MIDAZOLAM HCL 2 MG/2ML IJ SOLN
INTRAMUSCULAR | Status: AC
  Filled 2015-12-17: qty 2

## 2015-12-17 MED ORDER — LIDOCAINE HCL 1 % IJ SOLN
INTRAMUSCULAR | Status: DC | PRN
  Administered 2015-12-17: 10 mL

## 2015-12-17 MED ORDER — CEFAZOLIN SODIUM-DEXTROSE 2-4 GM/100ML-% IV SOLN: 2.0000 g | INTRAVENOUS | Status: DC

## 2015-12-17 MED ORDER — IOPAMIDOL (ISOVUE-300) INJECTION 61%
INTRAVENOUS | Status: AC
  Administered 2015-12-17: 60 mL
  Filled 2015-12-17: qty 150

## 2015-12-17 MED ORDER — LIDOCAINE HCL 1 % IJ SOLN
INTRAMUSCULAR | Status: AC
  Filled 2015-12-17: qty 20

## 2015-12-17 MED ORDER — SODIUM CHLORIDE 0.9 % IV SOLN: INTRAVENOUS | Status: AC

## 2015-12-17 MED ORDER — HEPARIN SODIUM (PORCINE) 1000 UNIT/ML IJ SOLN
INTRAMUSCULAR | Status: DC | PRN
  Administered 2015-12-17: 500 [IU] via INTRAVENOUS
  Administered 2015-12-17: 1000 [IU] via INTRAVENOUS

## 2015-12-17 MED ORDER — CLEVIDIPINE BUTYRATE 0.5 MG/ML IV EMUL
1.0000 mg/h | INTRAVENOUS | Status: DC
  Filled 2015-12-17: qty 50

## 2015-12-17 MED ORDER — FENTANYL CITRATE (PF) 100 MCG/2ML IJ SOLN
INTRAMUSCULAR | Status: DC | PRN
  Administered 2015-12-17: 25 ug via INTRAVENOUS

## 2015-12-17 MED ORDER — SODIUM CHLORIDE 0.9 % IV SOLN: INTRAVENOUS | Status: DC

## 2015-12-17 MED ORDER — MIDAZOLAM HCL 2 MG/2ML IJ SOLN
INTRAMUSCULAR | Status: DC | PRN
  Administered 2015-12-17: 1 mg via INTRAVENOUS

## 2015-12-17 MED ORDER — SODIUM CHLORIDE 0.9 % IV SOLN
INTRAVENOUS | Status: DC | PRN
  Administered 2015-12-17: 75 mL/h via INTRAVENOUS

## 2015-12-17 MED ORDER — FENTANYL CITRATE (PF) 100 MCG/2ML IJ SOLN
INTRAMUSCULAR | Status: AC
  Filled 2015-12-17: qty 2

## 2015-12-17 NOTE — Progress Notes (Signed)
V-pad removed no bleeding or hematoma

## 2015-12-17 NOTE — Sedation Documentation (Signed)
Femoral sheath removed, manual pressure being held by Michela Pitcher, RT

## 2015-12-17 NOTE — Sedation Documentation (Signed)
Patient is resting comfortably. 

## 2015-12-17 NOTE — Discharge Instructions (Signed)
Groin Site Care °Refer to this sheet in the next few weeks. These instructions provide you with information about caring for yourself after your procedure. Your health care provider may also give you more specific instructions. Your treatment has been planned according to current medical practices, but problems sometimes occur. Call your health care provider if you have any problems or questions after your procedure. °WHAT TO EXPECT AFTER THE PROCEDURE °After your procedure, it is typical to have the following: °· Bruising at the groin site that usually fades within 1-2 weeks. °· Blood collecting in the tissue (hematoma) that may be painful to the touch. It should usually decrease in size and tenderness within 1-2 weeks. °HOME CARE INSTRUCTIONS °· Take medicines only as directed by your health care provider. °· You may shower 24-48 hours after the procedure or as directed by your health care provider. Remove the bandage (dressing) and gently wash the site with plain soap and water. Pat the area dry with a clean towel. Do not rub the site, because this may cause bleeding. °· Do not take baths, swim, or use a hot tub until your health care provider approves. °· Check your insertion site every day for redness, swelling, or drainage. °· Do not apply powder or lotion to the site. °· Limit use of stairs to twice a day for the first 2-3 days or as directed by your health care provider. °· Do not squat for the first 2-3 days or as directed by your health care provider. °· Do not lift over 10 lb (4.5 kg) for 5 days after your procedure or as directed by your health care provider. °· Ask your health care provider when it is okay to: °¨ Return to work or school. °¨ Resume usual physical activities or sports. °¨ Resume sexual activity. °· Do not drive home if you are discharged the same day as the procedure. Have someone else drive you. °· You may drive 24 hours after the procedure unless otherwise instructed by your health  care provider. °· Do not operate machinery or power tools for 24 hours after the procedure or as directed by your health care provider. °· If your procedure was done as an outpatient procedure, which means that you went home the same day as your procedure, a responsible adult should be with you for the first 24 hours after you arrive home. °· Keep all follow-up visits as directed by your health care provider. This is important. °SEEK MEDICAL CARE IF: °· You have a fever. °· You have chills. °· You have increased bleeding from the groin site. Hold pressure on the site. °SEEK IMMEDIATE MEDICAL CARE IF: °· You have unusual pain at the groin site. °· You have redness, warmth, or swelling at the groin site. °· You have drainage (other than a small amount of blood on the dressing) from the groin site. °· The groin site is bleeding, and the bleeding does not stop after 30 minutes of holding steady pressure on the site. °· Your leg or foot becomes pale, cool, tingly, or numb. °  °This information is not intended to replace advice given to you by your health care provider. Make sure you discuss any questions you have with your health care provider. °  °Document Released: 10/21/2013 Document Reviewed: 10/21/2013 °Elsevier Interactive Patient Education ©2016 [employer] ° °

## 2015-12-17 NOTE — Progress Notes (Signed)
Per lab ~ 10- 15 minutes for P2Y12

## 2015-12-17 NOTE — Procedures (Signed)
S/P bilateral CCA and RT VA arteriograms. RT CFA approach. 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 2.approx 65 - 70 % stenosis of  Dominant RT VA origin. 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

## 2015-12-17 NOTE — Progress Notes (Signed)
Last dose of medication verified with Mendel Ryder, LPN at [place].

## 2015-12-17 NOTE — H&P (Signed)
Chief Complaint: left ICA stenosis  Supervising Physician: Luanne Bras  Patient Status:  Out-pt  HPI: Chelsey Gallagher is an 65 y.o. 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  She saw Dr. Estanislado Pandy in July of this year, but has just been scheduled.  She does have chronic kidney disease and her Cr today is 1.58, which is her baseline.  She otherwise has no other new symptoms.  Her speech is better than when Dr. Estanislado Pandy saw her last.    Past Medical History:  Past Medical History:  Diagnosis Date  . Anxiety   . Aortic stenosis    moderate AS 2014 echo  . Arthritis   . Chronic GI bleeding    presumed/notes 06/06/2015  . Chronic pain   . Complication of anesthesia    doesn't wake up good- "usually end up in ICU" also experiences post op nausea and vomiting  . Constipation   . Contracture of elbow   . Contracture of right wrist   . Depression   . Diabetes mellitus   . Diabetic neuropathy (HCC)    Diabetic neuropathy- has inproved since she quit smoking.  . Diabetic neuropathy ([place])   . Difficulty in walking   . Dysphagia, oropharyngeal phase   . Emphysema lung ([place])   . Facial weakness   . Foot drop, left   . Frequent UTI   . GERD (gastroesophageal reflux disease)   . H/O hiatal hernia    second one  . Hearing loss   . Heart murmur    PCP- [facility]- No tx needed.  . Hemiplegia affecting right dominant side ([place])   . History of blood transfusion 06/2015  . History of falling   . History of kidney stones   . Hyperlipidemia 05/14/2011  . Hypertension   . Iron deficiency anemia   . Iron deficiency anemia   . Lung abnormality 05/11/2011  . Occlusion and stenosis of carotid artery   . Orthostatic hypotension    after surgery.  . Orthostatic hypotension   . Overactive bladder   . PAD (peripheral artery disease) ([place])    S/p bypass grafting, unclear  exactly where  . Pneumonia    hx of  . Restless leg syndrome   . Stroke ([place]) 05/04/2011   Weakness on Right. Wears leg brace.  . Urinary incontinence     Past Surgical History:  Past Surgical History:  Procedure Laterality Date  . ABDOMINAL HYSTERECTOMY  2003  . ABDOMINAL HYSTERECTOMY    . APPENDECTOMY  2011  . BYPASS GRAFT  2003   Abdominal aortic  . CAROTID STENT INSERTION Right 2013   ICA/notes 06/06/2015  . CESAREAN SECTION  1984  . CHOLECYSTECTOMY  1984  . COLONOSCOPY N/A 05/10/2013   Procedure: COLONOSCOPY;  Surgeon: Lafayette Dragon, MD;  Location: [facility] ENDOSCOPY;  Service: Endoscopy;  Laterality: N/A;  . ESOPHAGOGASTRODUODENOSCOPY N/A 05/09/2013   Procedure: ESOPHAGOGASTRODUODENOSCOPY (EGD);  Surgeon: Lafayette Dragon, MD;  Location: [facility] ENDOSCOPY;  Service: Endoscopy;  Laterality: N/A;  . HERNIA REPAIR  2011  . JOINT REPLACEMENT Right   . [place]  . LITHOTRIPSY    . TOTAL HIP ARTHROPLASTY  2008   Right    Family History:  Family History  Problem Relation Age of Onset  . Cancer Maternal Aunt     breast  Social History:  reports that she has quit smoking. Her smoking use included Cigarettes. She smoked 0.00 packs per day for 45.00 years. She quit smokeless tobacco use about 4 years ago. She reports that she does not drink alcohol or use drugs.  Allergies:  Allergies  Allergen Reactions  . Flexeril [Cyclobenzaprine Hcl] Anaphylaxis  . Lyrica [Pregabalin] Other (See Comments)    "wires my up"  . Codeine Nausea And Vomiting    Medications: Medications reviewed in Epic  Please HPI for pertinent positives, otherwise complete 10 system ROS negative.  Mallampati Score: MD Evaluation Airway: WNL Heart: WNL Abdomen: WNL Chest/ Lungs: WNL ASA  Classification: 3 Mallampati/Airway Score: Two  Physical Exam: General: pleasant, WD, WN white female who is sitting in her wheelchair in NAD HEENT: head is normocephalic, atraumatic.  Sclera are  noninjected.  PERRL.  Ears and nose without any masses or lesions.  Mouth is pink and moist.  Heart: regular, rate, and rhythm.  Normal s1,s2.  +murmur No obvious gallops, or rubs noted.  Palpable radial and pedal pulses bilaterally Lungs: CTAB, no wheezes, rhonchi, or rales noted.  Respiratory effort nonlabored Abd: soft, NT, ND, +BS, no masses, hernias, or organomegaly Neuro: minimal strength and movements with RU and RL extremities.  Good strength and motion with LU and LL extremities.  Speech with minimal slurring. Psych: A&Ox3 with an appropriate affect.   Labs: Results for orders placed or performed during the hospital encounter of 12/17/15 (from the past 48 hour(s))  CBC with Differential/Platelet     Status: Abnormal   Collection Time: 12/17/15  7:32 AM  Result Value Ref Range   WBC 7.2 4.0 - 10.5 K/uL   RBC 3.49 (L) 3.87 - 5.11 MIL/uL   Hemoglobin 10.2 (L) 12.0 - 15.0 g/dL   HCT 32.8 (L) 36.0 - 46.0 %   MCV 94.0 78.0 - 100.0 fL   MCH 29.2 26.0 - 34.0 pg   MCHC 31.1 30.0 - 36.0 g/dL   RDW 16.0 (H) 11.5 - 15.5 %   Platelets 323 150 - 400 K/uL   Neutrophils Relative % 59 %   Neutro Abs 4.3 1.7 - 7.7 K/uL   Lymphocytes Relative 30 %   Lymphs Abs 2.1 0.7 - 4.0 K/uL   Monocytes Relative 10 %   Monocytes Absolute 0.7 0.1 - 1.0 K/uL   Eosinophils Relative 1 %   Eosinophils Absolute 0.1 0.0 - 0.7 K/uL   Basophils Relative 0 %   Basophils Absolute 0.0 0.0 - 0.1 K/uL  Basic metabolic panel     Status: Abnormal   Collection Time: 12/17/15  7:32 AM  Result Value Ref Range   Sodium 135 135 - 145 mmol/L   Potassium 4.3 3.5 - 5.1 mmol/L   Chloride 100 (L) 101 - 111 mmol/L   CO2 26 22 - 32 mmol/L   Glucose, Bld 175 (H) 65 - 99 mg/dL   BUN 28 (H) 6 - 20 mg/dL   Creatinine, Ser 1.58 (H) 0.44 - 1.00 mg/dL   Calcium 10.2 8.9 - 10.3 mg/dL   GFR calc non Af Amer 34 (L) >60 mL/min   GFR calc Af Amer 39 (L) >60 mL/min    Comment: (NOTE) The eGFR has been calculated using the CKD EPI  equation. This calculation has not been validated in all clinical situations. eGFR's persistently <60 mL/min signify possible Chronic Kidney Disease.    Anion gap 9 5 - 15  Protime-INR     Status: None  Collection Time: 12/17/15  7:32 AM  Result Value Ref Range   Prothrombin Time 12.1 11.4 - 15.2 seconds   INR 0.89   APTT     Status: None   Collection Time: 12/17/15  7:32 AM  Result Value Ref Range   aPTT 29 24 - 36 seconds  Glucose, capillary     Status: Abnormal   Collection Time: 12/17/15  7:51 AM  Result Value Ref Range   Glucose-Capillary 164 (H) 65 - 99 mg/dL   Comment 1 Notify RN    Comment 2 Document in Chart     Imaging: No results found.  Assessment/Plan 1. Left ICA stenosis 2. Chronic kidney disease  -we will plan today to proceed with a diagnostic cerebral angiogram.  We will NOT plan on intervention today secondary to her kidney disease and other risk factors.  We would like to evaluate her stenosis and then discuss the risk/benefit ratio with the patient and family prior to proceeding. -labs and vitals reviewed -Risks and Benefits discussed with the patient including, but not limited to bleeding, infection, vascular injury or contrast induced renal failure. All of the patient's questions were answered, patient is agreeable to proceed. Consent signed and in chart.  Thank you for this interesting consult.  I greatly enjoyed meeting DANYLAH HOLDEN and look forward to participating in their care.  A copy of this report was sent to the requesting provider on this date.  Electronically Signed: Henreitta Cea 12/17/2015, 9:04 AM   I spent a total of    25 Minutes in face to face in clinical consultation, greater than 50% of which was counseling/coordinating care for left ICA stenosis

## 2015-12-17 NOTE — Sedation Documentation (Signed)
Manual pressure still being held 

## 2015-12-17 NOTE — Progress Notes (Signed)
Called IR to speak to on call PA to get orders for lab work.

## 2015-12-17 NOTE — Sedation Documentation (Signed)
Patient is resting comfortably.  O2 3 L placed

## 2015-12-17 NOTE — Sedation Documentation (Signed)
Awaiting SS bed, moved to radiology holding area.  Groin and pulse stable.

## 2015-12-17 NOTE — Sedation Documentation (Addendum)
Patient is resting comfortably.  O2 d/c'd

## 2015-12-18 ENCOUNTER — Encounter (HOSPITAL_COMMUNITY): Payer: Self-pay | Admitting: Radiology

## 2015-12-19 ENCOUNTER — Encounter (HOSPITAL_COMMUNITY): Payer: Self-pay | Admitting: Interventional Radiology

## 2015-12-19 DIAGNOSIS — E119 Type 2 diabetes mellitus without complications: Secondary | ICD-10-CM | POA: Diagnosis not present

## 2015-12-19 DIAGNOSIS — H25813 Combined forms of age-related cataract, bilateral: Secondary | ICD-10-CM | POA: Diagnosis not present

## 2015-12-19 DIAGNOSIS — H353131 Nonexudative age-related macular degeneration, bilateral, early dry stage: Secondary | ICD-10-CM | POA: Diagnosis not present

## 2015-12-19 DIAGNOSIS — Z794 Long term (current) use of insulin: Secondary | ICD-10-CM | POA: Diagnosis not present

## 2015-12-19 LAB — HM DIABETES EYE EXAM

## 2015-12-28 DIAGNOSIS — E119 Type 2 diabetes mellitus without complications: Secondary | ICD-10-CM | POA: Diagnosis not present

## 2015-12-28 DIAGNOSIS — L089 Local infection of the skin and subcutaneous tissue, unspecified: Secondary | ICD-10-CM | POA: Diagnosis not present

## 2016-01-07 ENCOUNTER — Non-Acute Institutional Stay (SKILLED_NURSING_FACILITY): Payer: Medicare Other | Admitting: Nurse Practitioner

## 2016-01-07 ENCOUNTER — Encounter: Payer: Self-pay | Admitting: Nurse Practitioner

## 2016-01-07 DIAGNOSIS — H353131 Nonexudative age-related macular degeneration, bilateral, early dry stage: Secondary | ICD-10-CM | POA: Diagnosis not present

## 2016-01-07 DIAGNOSIS — N3281 Overactive bladder: Secondary | ICD-10-CM | POA: Diagnosis not present

## 2016-01-07 DIAGNOSIS — R300 Dysuria: Secondary | ICD-10-CM | POA: Diagnosis not present

## 2016-01-07 DIAGNOSIS — E782 Mixed hyperlipidemia: Secondary | ICD-10-CM

## 2016-01-07 DIAGNOSIS — I6990 Unspecified sequelae of unspecified cerebrovascular disease: Secondary | ICD-10-CM | POA: Diagnosis not present

## 2016-01-07 DIAGNOSIS — E1149 Type 2 diabetes mellitus with other diabetic neurological complication: Secondary | ICD-10-CM | POA: Diagnosis not present

## 2016-01-07 DIAGNOSIS — H2513 Age-related nuclear cataract, bilateral: Secondary | ICD-10-CM | POA: Diagnosis not present

## 2016-01-07 NOTE — Progress Notes (Signed)
Patient ID: VAEDA MAYE, female   DOB: [birth year], 65 y.o.   MRN: [id number]    Nursing Home Location:  [facility]  Place of Service: SNF (31)  PCP: [name], MD  Allergies  Allergen Reactions  . Flexeril [Cyclobenzaprine Hcl] Anaphylaxis  . Lyrica [Pregabalin] Other (See Comments)    "wires my up"  . Codeine Nausea And Vomiting    Chief Complaint  Patient presents with  . Medical Management of Chronic Issues    Routine Visit    HPI:  Patient is a 65 y.o. female seen today at [facility] for routine follow up. Pt with a hx of CVA with spastic hemiplegia, constipation, anxiety, DM, HTN, and hyperlipidemia.  Pt with ongoing complaints of dysuria, recent UA C&S showed no growth, pt is on nitrofurantion 100 mg daily for prophylactic.  Pt also reports frequency with urination - on vesicare but still has frequency- has tried other medication in the past.  Had eye doctor appt today. Plans for cataract surgery in December.   Blood sugars- ranging from 118-320, taking tresiba 25 units, novolog 10 units with meals and additional 5 if over 150   Review of Systems:  Review of Systems  Constitutional: Negative for activity change, appetite change, fatigue and unexpected weight change.  HENT: Negative for congestion, hearing loss and sinus pressure.   Eyes: Negative.   Respiratory: Negative for cough and shortness of breath.   Cardiovascular: Negative for chest pain, palpitations and leg swelling.  Gastrointestinal: Positive for constipation (controlled on medications). Negative for abdominal pain, diarrhea and nausea.  Genitourinary: Positive for dysuria and frequency (vesicare helps). Negative for difficulty urinating, pelvic pain and vaginal pain.  Musculoskeletal: Negative for arthralgias.  Skin: Negative for color change and wound.  Neurological: Negative for dizziness and weakness. Numbness: right side.  Psychiatric/Behavioral: Negative for behavioral  problems and confusion. The patient is not nervous/anxious.     Past Medical History:  Diagnosis Date  . Anxiety   . Aortic stenosis    moderate AS 2014 echo  . Arthritis   . Chronic GI bleeding    presumed/notes 06/06/2015  . Chronic pain   . Complication of anesthesia    doesn't wake up good- "usually end up in ICU" also experiences post op nausea and vomiting  . Constipation   . Contracture of elbow   . Contracture of right wrist   . Depression   . Diabetes mellitus   . Diabetic neuropathy (HCC)    Diabetic neuropathy- has inproved since she quit smoking.  . Diabetic neuropathy ([place])   . Difficulty in walking   . Dysphagia, oropharyngeal phase   . Emphysema lung ([place])   . Facial weakness   . Foot drop, left   . Frequent UTI   . GERD (gastroesophageal reflux disease)   . H/O hiatal hernia    second one  . Hearing loss   . Heart murmur    PCP- [facility]- No tx needed.  . Hemiplegia affecting right dominant side ([place])   . History of blood transfusion 06/2015  . History of falling   . History of kidney stones   . Hyperlipidemia 05/14/2011  . Hypertension   . Iron deficiency anemia   . Iron deficiency anemia   . Lung abnormality 05/11/2011  . Occlusion and stenosis of carotid artery   . Orthostatic hypotension    after surgery.  . Orthostatic hypotension   . Overactive bladder   . PAD (peripheral artery  disease) ([place])    S/p bypass grafting, unclear exactly where  . Pneumonia    hx of  . Restless leg syndrome   . Stroke ([place]) 05/04/2011   Weakness on Right. Wears leg brace.  . Urinary incontinence    Past Surgical History:  Procedure Laterality Date  . ABDOMINAL HYSTERECTOMY  2003  . ABDOMINAL HYSTERECTOMY    . APPENDECTOMY  2011  . BYPASS GRAFT  2003   Abdominal aortic  . CAROTID STENT INSERTION Right 2013   ICA/notes 06/06/2015  . CESAREAN SECTION  1984  . CHOLECYSTECTOMY  1984  . COLONOSCOPY N/A 05/10/2013   Procedure: COLONOSCOPY;  Surgeon:  Lafayette Dragon, MD;  Location: [facility] ENDOSCOPY;  Service: Endoscopy;  Laterality: N/A;  . ESOPHAGOGASTRODUODENOSCOPY N/A 05/09/2013   Procedure: ESOPHAGOGASTRODUODENOSCOPY (EGD);  Surgeon: Lafayette Dragon, MD;  Location: [facility] ENDOSCOPY;  Service: Endoscopy;  Laterality: N/A;  . HERNIA REPAIR  2011  . IR GENERIC HISTORICAL  12/17/2015   IR ANGIO INTRA EXTRACRAN SEL COM CAROTID INNOMINATE BILAT MOD SED 12/17/2015 [facility]-INTERV RAD  . IR GENERIC HISTORICAL  12/17/2015   IR ANGIO VERTEBRAL SEL VERTEBRAL UNI R MOD SED 12/17/2015 [facility]-INTERV RAD  . JOINT REPLACEMENT Right   . [place]  . LITHOTRIPSY    . RADIOLOGY WITH ANESTHESIA N/A 12/17/2015   Procedure: RADIOLOGY WITH ANESTHESIA STENT PLACMENT;  Surgeon: Medication Radiologist, MD;  Location: [place];  Service: Radiology;  Laterality: N/A;  . TOTAL HIP ARTHROPLASTY  2008   Right   Social History:   reports that she has quit smoking. Her smoking use included Cigarettes. She smoked 0.00 packs per day for 45.00 years. She quit smokeless tobacco use about 4 years ago. She reports that she does not drink alcohol or use drugs.  Family History  Problem Relation Age of Onset  . Cancer Maternal Aunt     breast    Medications: Patient's Medications  New Prescriptions   No medications on file  Previous Medications   ACETAMINOPHEN (TYLENOL) 325 MG TABLET    Take 650 mg by mouth every 6 (six) hours as needed for pain.   ALBUTEROL (PROVENTIL) (2.5 MG/3ML) 0.083% NEBULIZER SOLUTION    Take 3 mLs (2.5 mg total) by nebulization every 4 (four) hours as needed for wheezing or shortness of breath.   AMINO ACIDS-PROTEIN HYDROLYS (FEEDING SUPPLEMENT, PRO-STAT SUGAR FREE 64,) LIQD    Take 30 mLs by mouth daily. At 1700   AMLODIPINE (NORVASC) 5 MG TABLET    Take 1 tablet (5 mg total) by mouth daily.   ASPIRIN 81 MG CHEWABLE TABLET    Chew 81 mg by mouth daily.   BACLOFEN (LIORESAL) 10 MG TABLET    Take 5 mg by mouth 3 (three) times daily.   BISACODYL  (DULCOLAX) 10 MG SUPPOSITORY    Place 10 mg rectally daily as needed for moderate constipation.   CHOLECALCIFEROL (VITAMIN D) 1000 UNITS TABLET    Take 1,000 Units by mouth daily.   CLOPIDOGREL (PLAVIX) 75 MG TABLET    Take 75 mg by mouth at bedtime.   DULOXETINE (CYMBALTA) 60 MG CAPSULE    Take 60 mg by mouth daily.   FENOFIBRATE (TRICOR) 48 MG TABLET    Take 48 mg by mouth daily.   GABAPENTIN (NEURONTIN) 100 MG CAPSULE    Take 1 capsule (100 mg total) by mouth at bedtime.   HYDRALAZINE (APRESOLINE) 50 MG TABLET    Take 50 mg by mouth every 8 (  eight) hours. 10a. 2p, 6p   INSULIN ASPART (NOVOLOG FLEXPEN) 100 UNIT/ML FLEXPEN    Give 10 units SQ with meals. Give an additional 5 units for CBG over 150 with meals.   INSULIN DEGLUDEC (TRESIBA FLEXTOUCH) 100 UNIT/ML SOPN FLEXTOUCH PEN    Inject 25 Units into the skin at bedtime.   LORATADINE (CLARITIN) 10 MG TABLET    Take 10 mg by mouth daily.   LORAZEPAM (ATIVAN) 0.5 MG TABLET    Take 1/2 tablet(0.25 mg) by mouth every morning for anxiety(control). Take 1 tablet by mouth at bedtime for rest(control).   MAGNESIUM HYDROXIDE (MILK OF MAGNESIA) 800 MG/5ML SUSPENSION    Take 30 mLs by mouth daily as needed for constipation. 30cc   MENTHOL, TOPICAL ANALGESIC, (BIOFREEZE) 4 % GEL    To right ankle and bilateral knees TID   MULTIPLE VITAMINS-MINERALS (MULTI VITAMIN/MINERALS) TABS    Take 1 tablet by mouth daily.   NITROFURANTOIN, MACROCRYSTAL-MONOHYDRATE, (MACROBID) 100 MG CAPSULE    Take 100 mg by mouth at bedtime.   NYSTATIN (MYCOSTATIN/NYSTOP) POWDER    Apply to groin three times daily as needed for rash, redness, or itching.   OMEGA-3 ACID ETHYL ESTERS (LOVAZA) 1 G CAPSULE    Take 2 g by mouth 2 (two) times daily.   OMEPRAZOLE (PRILOSEC) 20 MG CAPSULE    Take 20 mg by mouth 2 (two) times daily with a meal.   OPHTHALMIC IRRIGATION SOLUTION (OCUSOFT EYE [place] OP)    Place 1 application into both eyes 2 (two) times daily.   PRAVASTATIN (PRAVACHOL) 40 MG  TABLET    Take 40 mg by mouth daily.    PROMETHAZINE (PHENERGAN) 25 MG TABLET    Take either by mouth, IM injection, or suppository every 6 hours PRN for nausea or vomiting.   PROPYLENE GLYCOL (SYSTANE BALANCE) 0.6 % SOLN    Place 1 drop into both eyes 2 (two) times daily.   SENNOSIDES-DOCUSATE SODIUM (SENOKOT-S) 8.6-50 MG TABLET    Take 2 tablets by mouth 2 (two) times daily.    SOLIFENACIN (VESICARE) 5 MG TABLET    Take 5 mg by mouth daily.   TRAMADOL (ULTRAM) 50 MG TABLET    Take 50 mg by mouth every 8 (eight) hours as needed for moderate pain.   TRAZODONE (DESYREL) 50 MG TABLET    Take 25 mg by mouth at bedtime.   VITAMIN C (ASCORBIC ACID) 500 MG TABLET    Take 500 mg by mouth 2 (two) times daily.    WHEAT DEXTRIN (BENEFIBER) POWD    Take 1 scoop by mouth daily. Mix with 8oz of fluid  Modified Medications   No medications on file  Discontinued Medications   No medications on file     Physical Exam: Vitals:   01/07/16 1148  BP: 139/83  Pulse: 84  Resp: 20  Temp: 99.2 F (37.3 C)  SpO2: 94%  Weight: 155 lb (70.3 kg)  Height: 5\' 3"  (1.6 m)    Physical Exam  Constitutional: She is oriented to person, place, and time. She appears well-developed and well-nourished. No distress.  HENT:  Head: Normocephalic and atraumatic.  Eyes: Pupils are equal, round, and reactive to light.  Neck: Normal range of motion. Neck supple.  Cardiovascular: Normal rate and regular rhythm.   Murmur heard. Pulmonary/Chest: Effort normal and breath sounds normal. No respiratory distress.  Abdominal: Soft. Bowel sounds are normal. She exhibits no distension. There is no tenderness.  Rounded abdomen   Musculoskeletal: She  exhibits no edema or tenderness.  Right sided hemiparesis   Lymphadenopathy:    She has no cervical adenopathy.  Neurological: She is alert and oriented to person, place, and time.  Skin: Skin is warm and dry. She is not diaphoretic.  Psychiatric: She has a normal mood and affect.      Labs reviewed: Basic Metabolic Panel:  Recent Labs  10/12/15 1028 10/12/15 1145 12/17/15 0732  NA 128* 128* 135  K 4.9 4.7 4.3  CL 95* 94* 100*  CO2 24 24 26   GLUCOSE 463* 413* 175*  BUN 26* 26* 28*  CREATININE 1.52* 1.51* 1.58*  CALCIUM 9.9 9.9 10.2   Liver Function Tests:  Recent Labs  06/05/15 0510 06/06/15 0615 10/12/15 1028  AST 19 20 18   ALT 14 13* 23  ALKPHOS 51 50 71  BILITOT 0.3 0.4 0.6  PROT 6.9 6.6 6.8  ALBUMIN 3.1* 2.9* 3.2*   No results for input(s): LIPASE, AMYLASE in the last 8760 hours. No results for input(s): AMMONIA in the last 8760 hours. CBC:  Recent Labs  10/12/15 1145 10/19/15 1408 12/17/15 0732  WBC 7.3 8.5 7.2  NEUTROABS 4.9 6.3 4.3  HGB 10.0* 10.1* 10.2*  HCT 31.4* 30.9* 32.8*  MCV 95.2 93.2 94.0  PLT 233 280 323   TSH: No results for input(s): TSH in the last 8760 hours. A1C: Lab Results  Component Value Date   HGBA1C 9.2 (H) 10/12/2015   Lipid Panel:  Recent Labs  02/08/15  CHOL 143  HDL 39  LDLCALC 46  TRIG 291*    Assessment/Plan 1. DM (diabetes mellitus), type 2 with neurological complications (HCC) Will increase tresbia to 28 units qhs, Will cont current novolog.  -will follow up A1c at this time.   2. 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 No increase in pain or spasms. Conts on plavix and ASA daily   3. Dysuria Recent UA C&S negative. Has urology appt tomorrow for follow up   4. Overactive bladder -conts on vesicare   5. Mixed hyperlipidemia conts on fenofibrate 48 mg daily and lovaza  -will follow up lipid panel    Alfonzo Arca K. Harle Battiest  [facility] [phone] 8 am - 5 pm) [phone] (after hours)

## 2016-01-08 DIAGNOSIS — E785 Hyperlipidemia, unspecified: Secondary | ICD-10-CM | POA: Diagnosis not present

## 2016-01-08 DIAGNOSIS — N3941 Urge incontinence: Secondary | ICD-10-CM | POA: Diagnosis not present

## 2016-01-08 DIAGNOSIS — E1140 Type 2 diabetes mellitus with diabetic neuropathy, unspecified: Secondary | ICD-10-CM | POA: Diagnosis not present

## 2016-01-08 DIAGNOSIS — R109 Unspecified abdominal pain: Secondary | ICD-10-CM | POA: Diagnosis not present

## 2016-01-08 DIAGNOSIS — N3020 Other chronic cystitis without hematuria: Secondary | ICD-10-CM | POA: Diagnosis not present

## 2016-01-09 LAB — LIPID PANEL
Cholesterol: 189 mg/dL (ref 0–200)
HDL: 28 mg/dL — AB (ref 35–70)
LDL Cholesterol: 115 mg/dL
Triglycerides: 229 mg/dL — AB (ref 40–160)

## 2016-01-09 LAB — HEMOGLOBIN A1C: Hemoglobin A1C: 7.2

## 2016-01-10 ENCOUNTER — Non-Acute Institutional Stay (SKILLED_NURSING_FACILITY): Payer: Medicare Other | Admitting: Internal Medicine

## 2016-01-10 ENCOUNTER — Encounter: Payer: Self-pay | Admitting: Internal Medicine

## 2016-01-10 DIAGNOSIS — E1169 Type 2 diabetes mellitus with other specified complication: Secondary | ICD-10-CM | POA: Diagnosis not present

## 2016-01-10 DIAGNOSIS — I10 Essential (primary) hypertension: Secondary | ICD-10-CM

## 2016-01-10 DIAGNOSIS — E785 Hyperlipidemia, unspecified: Secondary | ICD-10-CM | POA: Diagnosis not present

## 2016-01-10 DIAGNOSIS — I6990 Unspecified sequelae of unspecified cerebrovascular disease: Secondary | ICD-10-CM | POA: Diagnosis not present

## 2016-01-10 NOTE — Assessment & Plan Note (Addendum)
As per suboptimal blood pressure and standard of care for HTN in DM, add low dose ACE inhibitor

## 2016-01-10 NOTE — Assessment & Plan Note (Signed)
As documented, lipids not at goal as per standard of care & Will be discontinued and start 20 mg initiated Nutrition consult to discuss high  fructose corn syrup and triglycerides dietary restriction

## 2016-01-10 NOTE — Progress Notes (Signed)
    Facility Location: [facility]  Room Number: 104-A  Code Status: Full Code   This is a nursing facility follow up for specific acute issue of abnormal lipid profile.  Interim medical record and care since last [place] visit was updated with review of diagnostic studies and change in clinical status since last visit were documented.  HPI: The patient had fasting lipid profile 01/08/16. The patient does have a history of stroke but no history of heart attack. On pravastatin 40 mg daily her total cholesterol was 189, triglycerides 229, HDL 28, and LDL 115. Diabetes is adequately controlled with an A1c of 7.2%. Based on standard of care Tg should be less than 150; HDL greater than 50; and LDL less than 70. Literature suggests that she should be on either atorvastatin or generic Crestor based on statin potency. She had the stroke with right hemapheresis in 2013. Right carotid stent was inserted that time. She had an abdominal aortic bypass surgery 2003. She quit smoking at that time.  Review of systems: She has intermittent pain in both the right lower extremity as well as the left. The pain in left foot appears more positionally related Glucoses have improved according to her and the nursing staff when she decreased carbohydrate intake. Ophthalmologic exam is up-to-date. She is scheduled for cataract surgery on the left in December.  Eyes: No redness, discharge, pain, vision change Cardiovascular: No chest pain, palpitations,paroxysmal nocturnal dyspnea, claudication, edema  Respiratory: No cough, sputum production,hemoptysis, DOE , significant snoring,apnea  Gastrointestinal: No heartburn,dysphagia,abdominal pain, nausea / vomiting,rectal bleeding, melena,change in bowels Genitourinary: No dysuria,hematuria, pyuria,  incontinence, nocturia Dermatologic: No rash, pruritus, change in appearance of skin Neurologic: No dizziness,headache,syncope,  seizures, numbness , tingling Endocrine: No change in hair/skin/ nails, excessive thirst, excessive hunger, excessive urination    Physical exam:  Pertinent or positive findings: Speech is slightly slurred. She has marked hearing deficit. Slight ptosis is present. She has a grade 1.5 systolic murmur with radiation into the right greater than the left carotid. Pedal pulses are decreased. She wears a brace over the right forearm. She has flexion contractures of the right hand. She also has a brace on the right foot. The right biceps reflex is 1.5+, the left is 1+. There is striking decreased range of motion of the right upper and right lower extremities. She sits in a power wheelchair. General appearance:Adequately nourished; no acute distress , increased work of breathing is present.   Lymphatic: No lymphadenopathy about the head, neck, axilla . Eyes: No conjunctival inflammation or lid edema is present. There is no scleral icterus. Ears:  External ear exam shows no significant lesions or deformities.   Nose:  External nasal examination shows no deformity or inflammation. Nasal mucosa are pink and moist without lesions ,exudates Oral exam: lips and gums are healthy appearing. Neck:  No thyromegaly, masses, tenderness noted.    Heart:  Normal rate and regular rhythm. S1 and S2 normal without gallop, click, rub .  Lungs:Chest clear to auscultation without wheezes, rhonchi,rales , rubs. Abdomen:Bowel sounds are normal. Abdomen is soft and nontender with no organomegaly, hernias,masses. GU: deferred  Extremities:  No cyanosis, edema  Balance,Rhomberg,finger to nose testing could not be completed due to clinical state Skin: Warm & dry w/o tenting. No significant lesions or rash.   See summary under each active problem in the Problem List with associated updated therapeutic plan

## 2016-01-10 NOTE — Patient Instructions (Signed)
See Current Assessment & Plan in Problem List under specific Diagnosis 

## 2016-01-10 NOTE — Assessment & Plan Note (Signed)
Clinically stable, no change indicated

## 2016-01-17 DIAGNOSIS — F411 Generalized anxiety disorder: Secondary | ICD-10-CM | POA: Diagnosis not present

## 2016-01-17 DIAGNOSIS — G4700 Insomnia, unspecified: Secondary | ICD-10-CM | POA: Diagnosis not present

## 2016-01-17 DIAGNOSIS — F339 Major depressive disorder, recurrent, unspecified: Secondary | ICD-10-CM | POA: Diagnosis not present

## 2016-01-18 ENCOUNTER — Non-Acute Institutional Stay (SKILLED_NURSING_FACILITY): Payer: Medicare Other | Admitting: Nurse Practitioner

## 2016-01-18 ENCOUNTER — Encounter: Payer: Self-pay | Admitting: Nurse Practitioner

## 2016-01-18 DIAGNOSIS — R300 Dysuria: Secondary | ICD-10-CM

## 2016-01-18 DIAGNOSIS — R29898 Other symptoms and signs involving the musculoskeletal system: Secondary | ICD-10-CM | POA: Diagnosis not present

## 2016-01-18 DIAGNOSIS — E785 Hyperlipidemia, unspecified: Secondary | ICD-10-CM | POA: Diagnosis not present

## 2016-01-18 NOTE — Progress Notes (Signed)
Patient ID: Chelsey Gallagher, female   DOB: Oct 31, 1950, 65 y.o.   MRN: [id number]    Nursing Home Location:  [facility]  Place of Service: SNF (31)  PCP: [name], MD  Allergies  Allergen Reactions  . Flexeril [Cyclobenzaprine Hcl] Anaphylaxis  . Lyrica [Pregabalin] Other (See Comments)    "wires my up"  . Codeine Nausea And Vomiting    Chief Complaint  Patient presents with  . Acute Visit    Left arm weakness     HPI:  Patient is a 65 y.o. female seen today at [facility] for due to left arm weakness this morning. Pt with a hx of CVA with spastic hemiplegia, constipation, anxiety, DM, HTN, and hyperlipidemia.  Pt reports this morning she was holding onto her coffee cup and drop it. She had a hard time griping the handle. Since then weakness in her hand has improved. No other new deficits noted. No dizziness, weakness to left lower ext, blurred vision. Pt with hx of CVA in 2013 with right sided hemiparesis.  Pt also with recent lipids not at goal with LDL 115, total cholesterol was 189, triglycerides 229, HDL 28. Pt currently on pravastatin 40 mg daily.  Still reports dysuria, has been placed on Augmentin per urology Review of Systems:  Review of Systems  Constitutional: Negative for activity change, appetite change, fatigue and unexpected weight change.  HENT: Negative for congestion, hearing loss and sinus pressure.   Eyes: Negative.   Respiratory: Negative for cough and shortness of breath.   Cardiovascular: Negative for chest pain, palpitations and leg swelling.  Gastrointestinal: Negative for abdominal pain, diarrhea and nausea.  Genitourinary: Positive for dysuria and frequency (vesicare helps). Negative for difficulty urinating, pelvic pain and vaginal pain.  Musculoskeletal: Negative for arthralgias.  Skin: Negative for color change and wound.  Neurological: Positive for weakness and numbness (right side). Negative for dizziness, syncope, facial  asymmetry, speech difficulty, light-headedness and headaches.  Psychiatric/Behavioral: Negative for behavioral problems and confusion. The patient is not nervous/anxious.     Past Medical History:  Diagnosis Date  . Anxiety   . Aortic stenosis    moderate AS 2014 echo  . Arthritis   . Chronic GI bleeding    presumed/notes 06/06/2015  . Chronic pain   . Complication of anesthesia    doesn't wake up good- "usually end up in ICU" also experiences post op nausea and vomiting  . Constipation   . Contracture of elbow   . Contracture of right wrist   . Depression   . Diabetes mellitus   . Diabetic neuropathy (HCC)    Diabetic neuropathy- has inproved since she quit smoking.  . Diabetic neuropathy ([place])   . Difficulty in walking   . Dysphagia, oropharyngeal phase   . Emphysema lung ([place])   . Facial weakness   . Foot drop, left   . Frequent UTI   . GERD (gastroesophageal reflux disease)   . H/O hiatal hernia    second one  . Hearing loss   . Heart murmur    PCP- [facility]- No tx needed.  . Hemiplegia affecting right dominant side ([place])   . History of blood transfusion 06/2015  . History of kidney stones   . Hyperlipidemia 05/14/2011  . Hypertension   . Iron deficiency anemia   . Lung abnormality 05/11/2011  . Occlusion and stenosis of carotid artery   . Orthostatic hypotension    after surgery.  . Orthostatic hypotension   .  Overactive bladder   . PAD (peripheral artery disease) ([place])    S/p bypass grafting, unclear exactly where  . Pneumonia    hx of  . Restless leg syndrome   . Stroke ([place]) 05/04/2011   Weakness on Right. Wears leg brace.  . Urinary incontinence    Past Surgical History:  Procedure Laterality Date  . ABDOMINAL HYSTERECTOMY  2003  . APPENDECTOMY  2011  . BYPASS GRAFT  2003   Abdominal aortic  . CAROTID STENT INSERTION Right 2013   ICA/notes 06/06/2015  . CESAREAN SECTION  1984  . CHOLECYSTECTOMY  1984  . COLONOSCOPY N/A 05/10/2013    Procedure: COLONOSCOPY;  Surgeon: Lafayette Dragon, MD;  Location: [facility] ENDOSCOPY;  Service: Endoscopy;  Laterality: N/A;  . ESOPHAGOGASTRODUODENOSCOPY N/A 05/09/2013   Procedure: ESOPHAGOGASTRODUODENOSCOPY (EGD);  Surgeon: Lafayette Dragon, MD;  Location: [facility] ENDOSCOPY;  Service: Endoscopy;  Laterality: N/A;  . HERNIA REPAIR  2011  . IR GENERIC HISTORICAL  12/17/2015   IR ANGIO INTRA EXTRACRAN SEL COM CAROTID INNOMINATE BILAT MOD SED 12/17/2015 [facility]-INTERV RAD  . IR GENERIC HISTORICAL  12/17/2015   IR ANGIO VERTEBRAL SEL VERTEBRAL UNI R MOD SED 12/17/2015 [facility]INTER[facility] RAD  . JOINT REPLACEMENT Right   . [place]  . LITHOTRIPSY    . RADIOLOGY WITH ANESTHESIA N/A 12/17/2015   Procedure: RADIOLOGY WITH ANESTHESIA STENT PLACMENT;  Surgeon: Medication Radiologist, MD;  Location: [place];  Service: Radiology;  Laterality: N/A;  . TOTAL HIP ARTHROPLASTY  2008   Right   Social History:   reports that she has quit smoking. Her smoking use included Cigarettes. She smoked 0.00 packs per day for 45.00 years. She quit smokeless tobacco use about 4 years ago. She reports that she does not drink alcohol or use drugs.  Family History  Problem Relation Age of Onset  . Cancer Maternal Aunt     breast    Medications: Patient's Medications  New Prescriptions   No medications on file  Previous Medications   ACETAMINOPHEN (TYLENOL) 325 MG TABLET    Take 650 mg by mouth every 6 (six) hours as needed for pain.   ALBUTEROL (PROVENTIL) (2.5 MG/3ML) 0.083% NEBULIZER SOLUTION    Take 3 mLs (2.5 mg total) by nebulization every 4 (four) hours as needed for wheezing or shortness of breath.   AMINO ACIDS-PROTEIN HYDROLYS (FEEDING SUPPLEMENT, PRO-STAT SUGAR FREE 64,) LIQD    Take 30 mLs by mouth daily. At 1700   AMLODIPINE (NORVASC) 5 MG TABLET    Take 1 tablet (5 mg total) by mouth daily.   AMOXICILLIN-CLAVULANATE (AUGMENTIN) 500-125 MG TABLET    Take 1 tablet by mouth every 12 (twelve) hours.   ASPIRIN 81 MG  CHEWABLE TABLET    Chew 81 mg by mouth daily.   BACLOFEN (LIORESAL) 10 MG TABLET    Take 5 mg by mouth 3 (three) times daily.   BISACODYL (DULCOLAX) 10 MG SUPPOSITORY    Place 10 mg rectally daily as needed for moderate constipation.   CHOLECALCIFEROL (VITAMIN D) 1000 UNITS TABLET    Take 1,000 Units by mouth daily.   CLOPIDOGREL (PLAVIX) 75 MG TABLET    Take 75 mg by mouth at bedtime.   DULOXETINE (CYMBALTA) 60 MG CAPSULE    Take 60 mg by mouth daily.   FENOFIBRATE (TRICOR) 48 MG TABLET    Take 48 mg by mouth daily.   GABAPENTIN (NEURONTIN) 100 MG CAPSULE    Take 1 capsule (100 mg total)  by mouth at bedtime.   HYDRALAZINE (APRESOLINE) 50 MG TABLET    Take 50 mg by mouth every 8 (eight) hours. 10a. 2p, 6p   INSULIN ASPART (NOVOLOG FLEXPEN) 100 UNIT/ML FLEXPEN    Give 10 units SQ with meals. Give an additional 5 units for CBG over 150 with meals.   INSULIN DEGLUDEC (TRESIBA FLEXTOUCH) 100 UNIT/ML SOPN FLEXTOUCH PEN    Inject 28 Units into the skin at bedtime.    LISINOPRIL (PRINIVIL,ZESTRIL) 5 MG TABLET    Take 5 mg by mouth daily.   LORATADINE (CLARITIN) 10 MG TABLET    Take 10 mg by mouth daily.   LORAZEPAM (ATIVAN) 0.5 MG TABLET    Take 0.5 mg by mouth every 12 (twelve) hours as needed for anxiety. And 1/2 tablet by mouth daily scheduled   MAGNESIUM HYDROXIDE (MILK OF MAGNESIA) 800 MG/5ML SUSPENSION    Take 30 mLs by mouth daily as needed for constipation. 30cc   MENTHOL, TOPICAL ANALGESIC, (BIOFREEZE) 4 % GEL    To right ankle and bilateral knees TID   MULTIPLE VITAMINS-MINERALS (PRESERVISION AREDS 2+MULTI VIT PO)    Take 1 tablet by mouth 2 (two) times daily.   NITROFURANTOIN, MACROCRYSTAL-MONOHYDRATE, (MACROBID) 100 MG CAPSULE    Take 100 mg by mouth at bedtime.   NYSTATIN (MYCOSTATIN/NYSTOP) POWDER    Apply to groin three times daily as needed for rash, redness, or itching.   OMEGA-3 ACID ETHYL ESTERS (LOVAZA) 1 G CAPSULE    Take 2 g by mouth 2 (two) times daily.   OMEPRAZOLE (PRILOSEC) 20  MG CAPSULE    Take 20 mg by mouth 2 (two) times daily with a meal.   OPHTHALMIC IRRIGATION SOLUTION (OCUSOFT EYE Patrick OP)    Place 1 application into both eyes 2 (two) times daily.   PRAVASTATIN (PRAVACHOL) 40 MG TABLET    Take 40 mg by mouth daily.    PROMETHAZINE (PHENERGAN) 25 MG TABLET    Take either by mouth, IM injection, or suppository every 6 hours PRN for nausea or vomiting.   PROPYLENE GLYCOL (SYSTANE BALANCE) 0.6 % SOLN    Place 1 drop into both eyes 2 (two) times daily.   SENNOSIDES-DOCUSATE SODIUM (SENOKOT-S) 8.6-50 MG TABLET    Take 2 tablets by mouth 2 (two) times daily.    SOLIFENACIN (VESICARE) 5 MG TABLET    Take 5 mg by mouth daily.   TRAMADOL (ULTRAM) 50 MG TABLET    Take 50 mg by mouth every 8 (eight) hours as needed for moderate pain.   TRAZODONE (DESYREL) 50 MG TABLET    Take 25 mg by mouth at bedtime.   VITAMIN C (ASCORBIC ACID) 500 MG TABLET    Take 500 mg by mouth 2 (two) times daily.    WHEAT DEXTRIN (BENEFIBER) POWD    Take 1 scoop by mouth daily. Mix with 8oz of fluid  Modified Medications   No medications on file  Discontinued Medications   MULTIPLE VITAMINS-MINERALS (MULTI VITAMIN/MINERALS) TABS    Take 1 tablet by mouth daily.     Physical Exam: Vitals:   01/18/16 0924  BP: 128/73  Pulse: 76  Resp: 20  Temp: 98.1 F (36.7 C)  SpO2: 99%  Weight: 160 lb 12.8 oz (72.9 kg)  Height: 5\' 3"  (1.6 m)    Physical Exam  Constitutional: She is oriented to person, place, and time. She appears well-developed and well-nourished. No distress.  HENT:  Head: Normocephalic and atraumatic.  Eyes: Pupils are equal,  round, and reactive to light.  Neck: Normal range of motion. Neck supple.  Cardiovascular: Normal rate and regular rhythm.   Murmur heard. Pulmonary/Chest: Effort normal and breath sounds normal. No respiratory distress.  Abdominal: Soft. Bowel sounds are normal. She exhibits no distension. There is no tenderness.  Rounded abdomen   Musculoskeletal: She  exhibits no edema or tenderness.       Left hand: She exhibits normal range of motion and no tenderness. Normal sensation noted. Normal strength noted.  Right sided hemiparesis   Lymphadenopathy:    She has no cervical adenopathy.  Neurological: She is alert and oriented to person, place, and time.  Skin: Skin is warm and dry. She is not diaphoretic.  Psychiatric: She has a normal mood and affect.    Labs reviewed: Basic Metabolic Panel:  Recent Labs  10/12/15 1028 10/12/15 1145 12/17/15 0732  NA 128* 128* 135  K 4.9 4.7 4.3  CL 95* 94* 100*  CO2 24 24 26   GLUCOSE 463* 413* 175*  BUN 26* 26* 28*  CREATININE 1.52* 1.51* 1.58*  CALCIUM 9.9 9.9 10.2   Liver Function Tests:  Recent Labs  06/05/15 0510 06/06/15 0615 10/12/15 1028  AST 19 20 18   ALT 14 13* 23  ALKPHOS 51 50 71  BILITOT 0.3 0.4 0.6  PROT 6.9 6.6 6.8  ALBUMIN 3.1* 2.9* 3.2*   No results for input(s): LIPASE, AMYLASE in the last 8760 hours. No results for input(s): AMMONIA in the last 8760 hours. CBC:  Recent Labs  10/12/15 1145 10/19/15 1408 12/17/15 0732  WBC 7.3 8.5 7.2  NEUTROABS 4.9 6.3 4.3  HGB 10.0* 10.1* 10.2*  HCT 31.4* 30.9* 32.8*  MCV 95.2 93.2 94.0  PLT 233 280 323   TSH: No results for input(s): TSH in the last 8760 hours. A1C: Lab Results  Component Value Date   HGBA1C 7.2 01/09/2016   Lipid Panel:  Recent Labs  02/08/15 01/09/16  CHOL 143 189  HDL 39 28*  LDLCALC 46 115  TRIG 291* 229*    Assessment/Plan 1. Weakness of left hand Improved at this time, to cont to monitor and notify if recurs  Will get cbc with diff, BMP   2. Dyslipidemia, goal LDL below 70 Will change statin to crestor 20 mg PO qhs at this time Discussed dietary changes and compliance, to discuss with RD regarding food choices.  3. Dysuria On Augmentin at this time. To cont antibiotics per urology, pt to follow up once course complete.    Carlos American. Harle Battiest  [facility] [phone] 8 am - 5 pm) [phone] (after hours)

## 2016-01-21 DIAGNOSIS — Z79899 Other long term (current) drug therapy: Secondary | ICD-10-CM | POA: Diagnosis not present

## 2016-01-21 LAB — CBC AND DIFFERENTIAL
HEMATOCRIT: 32 % — AB (ref 36–46)
Hemoglobin: 10.3 g/dL — AB (ref 12.0–16.0)
PLATELETS: 239 10*3/uL (ref 150–399)
WBC: 7.3 10^3/mL

## 2016-01-21 LAB — BASIC METABOLIC PANEL
BUN: 36 mg/dL — AB (ref 4–21)
Creatinine: 1.8 mg/dL — AB (ref 0.5–1.1)
GLUCOSE: 176 mg/dL
Potassium: 4.7 mmol/L (ref 3.4–5.3)
SODIUM: 140 mmol/L (ref 137–147)

## 2016-01-22 ENCOUNTER — Other Ambulatory Visit: Payer: Self-pay | Admitting: *Deleted

## 2016-01-22 MED ORDER — TRAMADOL HCL 50 MG PO TABS: 50.0000 mg | ORAL_TABLET | Freq: Three times a day (TID) | ORAL | 0 refills | Status: DC | PRN

## 2016-02-04 DIAGNOSIS — R300 Dysuria: Secondary | ICD-10-CM | POA: Diagnosis not present

## 2016-02-05 DIAGNOSIS — F339 Major depressive disorder, recurrent, unspecified: Secondary | ICD-10-CM | POA: Diagnosis not present

## 2016-02-05 DIAGNOSIS — F411 Generalized anxiety disorder: Secondary | ICD-10-CM | POA: Diagnosis not present

## 2016-02-05 DIAGNOSIS — G4700 Insomnia, unspecified: Secondary | ICD-10-CM | POA: Diagnosis not present

## 2016-02-06 ENCOUNTER — Encounter: Payer: Self-pay | Admitting: Nurse Practitioner

## 2016-02-06 ENCOUNTER — Non-Acute Institutional Stay (SKILLED_NURSING_FACILITY): Payer: Medicare Other | Admitting: Nurse Practitioner

## 2016-02-06 DIAGNOSIS — N3281 Overactive bladder: Secondary | ICD-10-CM

## 2016-02-06 DIAGNOSIS — D500 Iron deficiency anemia secondary to blood loss (chronic): Secondary | ICD-10-CM

## 2016-02-06 DIAGNOSIS — F411 Generalized anxiety disorder: Secondary | ICD-10-CM

## 2016-02-06 DIAGNOSIS — G4700 Insomnia, unspecified: Secondary | ICD-10-CM | POA: Diagnosis not present

## 2016-02-06 DIAGNOSIS — I6990 Unspecified sequelae of unspecified cerebrovascular disease: Secondary | ICD-10-CM | POA: Diagnosis not present

## 2016-02-06 DIAGNOSIS — E785 Hyperlipidemia, unspecified: Secondary | ICD-10-CM | POA: Diagnosis not present

## 2016-02-06 DIAGNOSIS — E1149 Type 2 diabetes mellitus with other diabetic neurological complication: Secondary | ICD-10-CM

## 2016-02-06 DIAGNOSIS — R300 Dysuria: Secondary | ICD-10-CM

## 2016-02-06 DIAGNOSIS — K5909 Other constipation: Secondary | ICD-10-CM | POA: Diagnosis not present

## 2016-02-06 NOTE — Progress Notes (Signed)
Patient ID: Chelsey Gallagher, female   DOB: [birth year], 65 y.o.   MRN: [id number]    Nursing Home Location:  [facility]  Place of Service: SNF (31)  PCP: [name], MD  Allergies  Allergen Reactions  . Flexeril [Cyclobenzaprine Hcl] Anaphylaxis  . Lyrica [Pregabalin] Other (See Comments)    "wires my up"  . Codeine Nausea And Vomiting    Chief Complaint  Patient presents with  . Medical Management of Chronic Issues    Routine Visit    HPI:  Patient is a 65 y.o. female seen today at [facility] for routine follow up. Pt with a hx of CVA with spastic hemiplegia, constipation, anxiety, DM, HTN, and hyperlipidemia.  Pt saw urologist yesterday and started estrace cream vaginally at bedtime for painful urination caused by vaginal atrophy. She is unable to see if this has helped.  A1c has improved with last A1c in November to 7.2.  Cholesterol was not at goal so crestor was started. Pt tolerating statin at this time.  Pt has been seen by psych services after Cymbalta and trazodone decreased. She was very tearful and christmas time is not a good time of year for her. Lots of deaths in the family around this time and she repots she was also very involved in baking and visiting people around the holidays and she is unable to do that now. Psych services increased her Cymbalta and trazodone back to previous dosing.   Still having a lot of trouble sleeping, gets woken up a lot at night and this makes her agitated.  Recently Cr/BUn elevated, baclofen dose decreased due to GFR.  Bowels are moving good Review of Systems:  Review of Systems  Constitutional: Negative for activity change, appetite change, fatigue and unexpected weight change.  HENT: Negative for congestion, hearing loss and sinus pressure.   Eyes: Negative.   Respiratory: Negative for cough and shortness of breath.   Cardiovascular: Negative for chest pain, palpitations and leg swelling.  Gastrointestinal:  Negative for abdominal pain, diarrhea and nausea.  Genitourinary: Positive for dysuria and frequency (vesicare helps). Negative for difficulty urinating, pelvic pain and vaginal pain.  Musculoskeletal: Negative for arthralgias.  Skin: Negative for color change and wound.  Neurological: Positive for weakness and numbness (right side). Negative for dizziness, syncope, facial asymmetry, speech difficulty, light-headedness and headaches.  Psychiatric/Behavioral: Negative for behavioral problems and confusion. The patient is not nervous/anxious.     Past Medical History:  Diagnosis Date  . Anxiety   . Aortic stenosis    moderate AS 2014 echo  . Arthritis   . Chronic GI bleeding    presumed/notes 06/06/2015  . Chronic pain   . Complication of anesthesia    doesn't wake up good- "usually end up in ICU" also experiences post op nausea and vomiting  . Constipation   . Contracture of elbow   . Contracture of right wrist   . Depression   . Diabetes mellitus   . Diabetic neuropathy (HCC)    Diabetic neuropathy- has inproved since she quit smoking.  . Diabetic neuropathy ([place])   . Difficulty in walking   . Dysphagia, oropharyngeal phase   . Emphysema lung ([place])   . Facial weakness   . Foot drop, left   . Frequent UTI   . GERD (gastroesophageal reflux disease)   . H/O hiatal hernia    second one  . Hearing loss   . Heart murmur    [facility]- No  tx needed.  . Hemiplegia affecting right dominant side ([place])   . History of blood transfusion 06/2015  . History of kidney stones   . Hyperlipidemia 05/14/2011  . Hypertension   . Iron deficiency anemia   . Lung abnormality 05/11/2011  . Occlusion and stenosis of carotid artery   . Orthostatic hypotension    after surgery.  . Orthostatic hypotension   . Overactive bladder   . PAD (peripheral artery disease) ([place])    S/p bypass grafting, unclear exactly where  . Pneumonia    hx of  . Restless leg syndrome   . Stroke ([place])  05/04/2011   Weakness on Right. Wears leg brace.  . Urinary incontinence    Past Surgical History:  Procedure Laterality Date  . ABDOMINAL HYSTERECTOMY  2003  . APPENDECTOMY  2011  . BYPASS GRAFT  2003   Abdominal aortic  . CAROTID STENT INSERTION Right 2013   ICA/notes 06/06/2015  . CESAREAN SECTION  1984  . CHOLECYSTECTOMY  1984  . COLONOSCOPY N/A 05/10/2013   Procedure: COLONOSCOPY;  Surgeon: Lafayette Dragon, MD;  Location: [facility] ENDOSCOPY;  Service: Endoscopy;  Laterality: N/A;  . ESOPHAGOGASTRODUODENOSCOPY N/A 05/09/2013   Procedure: ESOPHAGOGASTRODUODENOSCOPY (EGD);  Surgeon: Lafayette Dragon, MD;  Location: [facility] ENDOSCOPY;  Service: Endoscopy;  Laterality: N/A;  . HERNIA REPAIR  2011  . IR GENERIC HISTORICAL  12/17/2015   IR ANGIO INTRA EXTRACRAN SEL COM CAROTID INNOMINATE BILAT MOD SED 12/17/2015 [facility]-INTERV RAD  . IR GENERIC HISTORICAL  12/17/2015   IR ANGIO VERTEBRAL SEL VERTEBRAL UNI R MOD SED 12/17/2015 [facility]-INTER[facility] RAD  . JOINT REPLACEMENT Right   . [place]  . LITHOTRIPSY    . RADIOLOGY WITH ANESTHESIA N/A 12/17/2015   Procedure: RADIOLOGY WITH ANESTHESIA STENT PLACMENT;  Surgeon: Medication Radiologist, MD;  Location: [place];  Service: Radiology;  Laterality: N/A;  . TOTAL HIP ARTHROPLASTY  2008   Right   Social History:   reports that she has quit smoking. Her smoking use included Cigarettes. She smoked 0.00 packs per day for 45.00 years. She quit smokeless tobacco use about 4 years ago. She reports that she does not drink alcohol or use drugs.  Family History  Problem Relation Age of Onset  . Cancer Maternal Aunt     breast    Medications: Patient's Medications  New Prescriptions   No medications on file  Previous Medications   ACETAMINOPHEN (TYLENOL) 325 MG TABLET    Take 650 mg by mouth every 6 (six) hours as needed for pain.   ALBUTEROL (PROVENTIL) (2.5 MG/3ML) 0.083% NEBULIZER SOLUTION    Take 3 mLs (2.5 mg total) by nebulization every 4 (four) hours as  needed for wheezing or shortness of breath.   AMINO ACIDS-PROTEIN HYDROLYS (FEEDING SUPPLEMENT, PRO-STAT SUGAR FREE 64,) LIQD    Take 30 mLs by mouth daily. At 1700   AMLODIPINE (NORVASC) 5 MG TABLET    Take 1 tablet (5 mg total) by mouth daily.   ASPIRIN 81 MG CHEWABLE TABLET    Chew 81 mg by mouth daily.   BACLOFEN (LIORESAL) 5 MG TABS TABLET    Take 2.5 mg by mouth every 12 (twelve) hours as needed for muscle spasms.   BESIFLOXACIN HCL (BESIVANCE) 0.6 % SUSP    Place 1 drop in the left eye twice a day the day before surgery, morning of, and week after. Stop date: 02/14/16   BISACODYL (DULCOLAX) 10 MG SUPPOSITORY    Place 10 mg  rectally daily as needed for moderate constipation.   BROMFENAC SODIUM (PROLENSA) 0.07 % SOLN    Apply 1 drop every day to left eye on day before surgery, morning of, and for 2 weeks after surgery. Stop date: 02/21/16   CHOLECALCIFEROL (VITAMIN D) 1000 UNITS TABLET    Take 1,000 Units by mouth daily.   CLOPIDOGREL (PLAVIX) 75 MG TABLET    Take 75 mg by mouth at bedtime.   DIFLUPREDNATE (DUREZOL) 0.05 % EMUL    Instill 1 drop twice a day in left eye for 1 month. Stop date: 03/10/16   DULOXETINE (CYMBALTA) 60 MG CAPSULE    Take 60 mg by mouth daily.   ESTRADIOL (ESTRACE) 0.1 MG/GM VAGINAL CREAM    Place 1 Applicatorful vaginally at bedtime.   FENOFIBRATE (TRICOR) 48 MG TABLET    Take 48 mg by mouth daily.   GABAPENTIN (NEURONTIN) 100 MG CAPSULE    Take 1 capsule (100 mg total) by mouth at bedtime.   HYDRALAZINE (APRESOLINE) 50 MG TABLET    Take 50 mg by mouth every 8 (eight) hours. 10a. 2p, 6p   INSULIN ASPART (NOVOLOG FLEXPEN) 100 UNIT/ML FLEXPEN    Give 10 units SQ with meals. Give an additional 5 units for CBG over 150 with meals.   INSULIN DEGLUDEC (TRESIBA FLEXTOUCH) 100 UNIT/ML SOPN FLEXTOUCH PEN    Inject 28 Units into the skin at bedtime.    LISINOPRIL (PRINIVIL,ZESTRIL) 5 MG TABLET    Take 5 mg by mouth daily.   LORATADINE (CLARITIN) 10 MG TABLET    Take 10 mg by  mouth daily.   LORAZEPAM (ATIVAN) 0.5 MG TABLET    Take 0.5 mg by mouth every 12 (twelve) hours as needed for anxiety. For 60 days. Stop date: 04/07/16   MAGNESIUM HYDROXIDE (MILK OF MAGNESIA) 800 MG/5ML SUSPENSION    Take 30 mLs by mouth daily as needed for constipation. 30cc   MENTHOL, TOPICAL ANALGESIC, (BIOFREEZE) 4 % GEL    To right ankle and bilateral knees TID   MULTIPLE VITAMINS-MINERALS (PRESERVISION AREDS 2+MULTI VIT PO)    Take 1 tablet by mouth 2 (two) times daily.   NITROFURANTOIN, MACROCRYSTAL-MONOHYDRATE, (MACROBID) 100 MG CAPSULE    Take 100 mg by mouth at bedtime.   NYSTATIN (MYCOSTATIN/NYSTOP) POWDER    Apply to groin three times daily as needed for rash, redness, or itching.   OMEGA-3 ACID ETHYL ESTERS (LOVAZA) 1 G CAPSULE    Take 2 g by mouth 2 (two) times daily.   OMEPRAZOLE (PRILOSEC) 20 MG CAPSULE    Take 20 mg by mouth 2 (two) times daily with a meal.   PROMETHAZINE (PHENERGAN) 25 MG TABLET    Take either by mouth, IM injection, or suppository every 6 hours PRN for nausea or vomiting.   PROPYLENE GLYCOL (SYSTANE BALANCE) 0.6 % SOLN    Place 1 drop into both eyes 2 (two) times daily.   ROSUVASTATIN (CRESTOR) 20 MG TABLET    Take 20 mg by mouth daily.   SENNOSIDES-DOCUSATE SODIUM (SENOKOT-S) 8.6-50 MG TABLET    Take 2 tablets by mouth 2 (two) times daily.    SOLIFENACIN (VESICARE) 5 MG TABLET    Take 5 mg by mouth daily.   TRAMADOL (ULTRAM) 50 MG TABLET    Take 1 tablet (50 mg total) by mouth every 8 (eight) hours as needed for moderate pain.   TRAZODONE (DESYREL) 50 MG TABLET    Take 50 mg by mouth at bedtime.  VITAMIN C (ASCORBIC ACID) 500 MG TABLET    Take 500 mg by mouth 2 (two) times daily.    WHEAT DEXTRIN (BENEFIBER) POWD    Take 1 scoop by mouth daily. Mix with 8oz of fluid  Modified Medications   No medications on file  Discontinued Medications   AMOXICILLIN-CLAVULANATE (AUGMENTIN) 500-125 MG TABLET    Take 1 tablet by mouth every 12 (twelve) hours.   BACLOFEN  (LIORESAL) 10 MG TABLET    Take 5 mg by mouth 3 (three) times daily.   LORAZEPAM (ATIVAN) 0.5 MG TABLET    Take 0.5 mg by mouth every 12 (twelve) hours as needed for anxiety. And 1/2 tablet by mouth daily scheduled   OPHTHALMIC IRRIGATION SOLUTION (OCUSOFT EYE [place] OP)    Place 1 application into both eyes 2 (two) times daily.   PRAVASTATIN (PRAVACHOL) 40 MG TABLET    Take 40 mg by mouth daily.      Physical Exam: Vitals:   02/06/16 1024  BP: 115/83  Pulse: 82  Resp: 18  Temp: 99 F (37.2 C)  SpO2: 98%  Weight: 160 lb 12.8 oz (72.9 kg)  Height: 5\' 3"  (1.6 m)    Physical Exam  Constitutional: She is oriented to person, place, and time. She appears well-developed and well-nourished. No distress.  HENT:  Head: Normocephalic and atraumatic.  Eyes: Pupils are equal, round, and reactive to light.  Neck: Normal range of motion. Neck supple.  Cardiovascular: Normal rate and regular rhythm.   Murmur heard. Pulmonary/Chest: Effort normal and breath sounds normal. No respiratory distress.  Abdominal: Soft. Bowel sounds are normal. She exhibits no distension. There is no tenderness.  Rounded abdomen   Musculoskeletal: She exhibits no edema or tenderness.       Left hand: She exhibits normal range of motion and no tenderness. Normal sensation noted. Normal strength noted.  Right sided hemiparesis   Lymphadenopathy:    She has no cervical adenopathy.  Neurological: She is alert and oriented to person, place, and time.  Skin: Skin is warm and dry. She is not diaphoretic.  Psychiatric: She has a normal mood and affect.    Labs reviewed: Basic Metabolic Panel:  Recent Labs  10/12/15 1028 10/12/15 1145 12/17/15 0732 01/21/16  NA 128* 128* 135 140  K 4.9 4.7 4.3 4.7  CL 95* 94* 100*  --   CO2 24 24 26   --   GLUCOSE 463* 413* 175*  --   BUN 26* 26* 28* 36*  CREATININE 1.52* 1.51* 1.58* 1.8*  CALCIUM 9.9 9.9 10.2  --    Liver Function Tests:  Recent Labs  06/05/15 0510  06/06/15 0615 10/12/15 1028  AST 19 20 18   ALT 14 13* 23  ALKPHOS 51 50 71  BILITOT 0.3 0.4 0.6  PROT 6.9 6.6 6.8  ALBUMIN 3.1* 2.9* 3.2*   No results for input(s): LIPASE, AMYLASE in the last 8760 hours. No results for input(s): AMMONIA in the last 8760 hours. CBC:  Recent Labs  10/12/15 1145 10/19/15 1408 12/17/15 0732 01/21/16  WBC 7.3 8.5 7.2 7.3  NEUTROABS 4.9 6.3 4.3  --   HGB 10.0* 10.1* 10.2* 10.3*  HCT 31.4* 30.9* 32.8* 32*  MCV 95.2 93.2 94.0  --   PLT 233 280 323 239   TSH: No results for input(s): TSH in the last 8760 hours. A1C: Lab Results  Component Value Date   HGBA1C 7.2 01/09/2016   Lipid Panel:  Recent Labs  02/08/15 01/09/16  CHOL 143 189  HDL 39 28*  LDLCALC 46 115  TRIG 291* 229*    Assessment/Plan 1. DM (diabetes mellitus), type 2 with neurological complications (HCC) -123456 has improved, will cont current regimen.   2. Overactive bladder Stable on vesicare  3. Late effects of CVA (cerebrovascular accident) Stable, baclofen reduced due to GFR. No increase in muscle spasm or pain.   4. Iron deficiency anemia due to chronic blood loss Remains stable.   5. Insomnia, unspecified type conts on trazodone qhs  6. Generalized anxiety disorder Worse around the holidays, to cont cymbalta 60 mg daily with ativan PRN   7. Chronic constipation Well controlled on current regimen  8. Dyslipidemia, goal LDL below 70 Tolerating statin, Conts on Crestor daily as well as tricor and lovaza   9. Dysuria Started on estrace vaginally to help with symptoms per urologist, will montior Khylie Larmore K. Harle Battiest  [facility] [phone] 8 am - 5 pm) [phone] (after hours)

## 2016-02-07 DIAGNOSIS — H2512 Age-related nuclear cataract, left eye: Secondary | ICD-10-CM | POA: Diagnosis not present

## 2016-02-07 DIAGNOSIS — H25012 Cortical age-related cataract, left eye: Secondary | ICD-10-CM | POA: Diagnosis not present

## 2016-02-07 DIAGNOSIS — H25812 Combined forms of age-related cataract, left eye: Secondary | ICD-10-CM | POA: Diagnosis not present

## 2016-02-18 ENCOUNTER — Other Ambulatory Visit: Payer: Self-pay | Admitting: Internal Medicine

## 2016-02-18 DIAGNOSIS — Z1231 Encounter for screening mammogram for malignant neoplasm of breast: Secondary | ICD-10-CM

## 2016-02-26 ENCOUNTER — Ambulatory Visit
Admission: RE | Admit: 2016-02-26 | Discharge: 2016-02-26 | Disposition: A | Discharge status: Home / Self Care | Payer: Medicare Other | Source: Ambulatory Visit | Attending: Internal Medicine | Admitting: Internal Medicine

## 2016-02-26 DIAGNOSIS — Z1231 Encounter for screening mammogram for malignant neoplasm of breast: Secondary | ICD-10-CM | POA: Diagnosis not present

## 2016-03-04 DIAGNOSIS — Z79899 Other long term (current) drug therapy: Secondary | ICD-10-CM | POA: Diagnosis not present

## 2016-03-04 LAB — LIPID PANEL
Cholesterol: 117 mg/dL (ref 0–200)
HDL: 35 mg/dL (ref 35–70)
LDL CALC: 55 mg/dL
TRIGLYCERIDES: 139 mg/dL (ref 40–160)

## 2016-03-05 ENCOUNTER — Encounter: Payer: Self-pay | Admitting: Nurse Practitioner

## 2016-03-05 ENCOUNTER — Non-Acute Institutional Stay (SKILLED_NURSING_FACILITY): Payer: Medicare Other | Admitting: Nurse Practitioner

## 2016-03-05 DIAGNOSIS — R05 Cough: Secondary | ICD-10-CM

## 2016-03-05 DIAGNOSIS — H00014 Hordeolum externum left upper eyelid: Secondary | ICD-10-CM | POA: Diagnosis not present

## 2016-03-05 DIAGNOSIS — R0989 Other specified symptoms and signs involving the circulatory and respiratory systems: Secondary | ICD-10-CM | POA: Diagnosis not present

## 2016-03-05 DIAGNOSIS — R059 Cough, unspecified: Secondary | ICD-10-CM

## 2016-03-05 NOTE — Progress Notes (Signed)
Patient ID: Chelsey Gallagher, female   DOB: 01-Dec-1950, 66 y.o.   MRN: [id number]    Nursing Home Location:  [facility]  Place of Service: SNF (31)  PCP: [name], MD  Allergies  Allergen Reactions  . Flexeril [Cyclobenzaprine Hcl] Anaphylaxis  . Lyrica [Pregabalin] Other (See Comments)    "wires my up"  . Codeine Nausea And Vomiting    Chief Complaint  Patient presents with  . Acute Visit    Chest congestion with cough; eyelid stye    HPI:  Patient is a 66 y.o. female seen today at [facility] for painful red place on eye. Pt with a hx of CVA with spastic hemiplegia, constipation, anxiety, DM, HTN, and hyperlipidemia. Pt reports she has had a painful red area to left upper eyelid for several days. Has been using warm compresses which help when she is applying them. No drainage noted. Not getting worse but has not improved either Also reports increase in mucous and congestion in the morning. Does not feel sick or unwell. No increase in shortness of breath. No changes in color of sputum.   Review of Systems:  Review of Systems  Constitutional: Negative for activity change, appetite change, fever and unexpected weight change.  HENT: Positive for congestion. Negative for sinus pressure.   Eyes: Negative for discharge.       Left eyelid tender with red bump  Respiratory: Positive for cough. Negative for shortness of breath and wheezing.   Cardiovascular: Negative for chest pain and leg swelling.    Past Medical History:  Diagnosis Date  . Anxiety   . Aortic stenosis    moderate AS 2014 echo  . Arthritis   . Chronic GI bleeding    presumed/notes 06/06/2015  . Chronic pain   . Complication of anesthesia    doesn't wake up good- "usually end up in ICU" also experiences post op nausea and vomiting  . Constipation   . Contracture of elbow   . Contracture of right wrist   . Depression   . Diabetes mellitus   . Diabetic neuropathy (HCC)    Diabetic  neuropathy- has inproved since she quit smoking.  . Diabetic neuropathy ([place])   . Difficulty in walking   . Dysphagia, oropharyngeal phase   . Emphysema lung ([place])   . Facial weakness   . Foot drop, left   . Frequent UTI   . GERD (gastroesophageal reflux disease)   . H/O hiatal hernia    second one  . Hearing loss   . Heart murmur    PCP- [facility]- No tx needed.  . Hemiplegia affecting right dominant side ([place])   . History of blood transfusion 06/2015  . History of kidney stones   . Hyperlipidemia 05/14/2011  . Hypertension   . Iron deficiency anemia   . Lung abnormality 05/11/2011  . Occlusion and stenosis of carotid artery   . Orthostatic hypotension    after surgery.  . Orthostatic hypotension   . Overactive bladder   . PAD (peripheral artery disease) ([place])    S/p bypass grafting, unclear exactly where  . Pneumonia    hx of  . Restless leg syndrome   . Stroke ([place]) 05/04/2011   Weakness on Right. Wears leg brace.  . Urinary incontinence    Past Surgical History:  Procedure Laterality Date  . ABDOMINAL HYSTERECTOMY  2003  . APPENDECTOMY  2011  . BYPASS GRAFT  2003   Abdominal aortic  .  CAROTID STENT INSERTION Right 2013   ICA/notes 06/06/2015  . CESAREAN SECTION  1984  . CHOLECYSTECTOMY  1984  . COLONOSCOPY N/A 05/10/2013   Procedure: COLONOSCOPY;  Surgeon: Lafayette Dragon, MD;  Location: [facility]COPY;  Service: Endoscopy;  Laterality: N/A;  . ESOPHAGOGASTRODUODENOSCOPY N/A 05/09/2013   Procedure: ESOPHAGOGASTRODUODENOSCOPY (EGD);  Surgeon: Lafayette Dragon, MD;  Location: [facility] ENDOSCOPY;  Service: Endoscopy;  Laterality: N/A;  . HERNIA REPAIR  2011  . IR GENERIC HISTORICAL  12/17/2015   IR ANGIO INTRA EXTRACRAN SEL COM CAROTID INNOMINATE BILAT MOD SED 12/17/2015 [facility]-INTER[facility] RAD  . IR GENERIC HISTORICAL  12/17/2015   IR ANGIO VERTEBRAL SEL VERTEBRAL UNI R MOD SED 12/17/2015 [facility]-INTER[facility] RAD  . JOINT REPLACEMENT Right   . [place]  . LITHOTRIPSY      . RADIOLOGY WITH ANESTHESIA N/A 12/17/2015   Procedure: RADIOLOGY WITH ANESTHESIA STENT PLACMENT;  Surgeon: Medication Radiologist, MD;  Location: [place];  Service: Radiology;  Laterality: N/A;  . TOTAL HIP ARTHROPLASTY  2008   Right   Social History:   reports that she has quit smoking. Her smoking use included Cigarettes. She smoked 0.00 packs per day for 45.00 years. She quit smokeless tobacco use about 4 years ago. She reports that she does not drink alcohol or use drugs.  Family History  Problem Relation Age of Onset  . Cancer Maternal Aunt     breast    Medications: Patient's Medications  New Prescriptions   No medications on file  Previous Medications   ACETAMINOPHEN (TYLENOL) 325 MG TABLET    Take 650 mg by mouth every 6 (six) hours as needed for pain.   ALBUTEROL (PROVENTIL) (2.5 MG/3ML) 0.083% NEBULIZER SOLUTION    Take 3 mLs (2.5 mg total) by nebulization every 4 (four) hours as needed for wheezing or shortness of breath.   AMINO ACIDS-PROTEIN HYDROLYS (FEEDING SUPPLEMENT, PRO-STAT SUGAR FREE 64,) LIQD    Take 30 mLs by mouth daily. At 1700   AMLODIPINE (NORVASC) 5 MG TABLET    Take 1 tablet (5 mg total) by mouth daily.   ASPIRIN 81 MG CHEWABLE TABLET    Chew 81 mg by mouth daily.   BACLOFEN (LIORESAL) 5 MG TABS TABLET    Take 2.5 mg by mouth every 12 (twelve) hours as needed for muscle spasms.   BISACODYL (DULCOLAX) 10 MG SUPPOSITORY    Place 10 mg rectally daily as needed for moderate constipation.   CHOLECALCIFEROL (VITAMIN D) 1000 UNITS TABLET    Take 1,000 Units by mouth daily.   CLOPIDOGREL (PLAVIX) 75 MG TABLET    Take 75 mg by mouth at bedtime.   DIFLUPREDNATE (DUREZOL) 0.05 % EMUL    Instill 1 drop twice a day in left eye for 1 month. Stop date: 03/08/16   DULOXETINE (CYMBALTA) 60 MG CAPSULE    Take 60 mg by mouth daily.   ESTRADIOL (ESTRACE) 0.1 MG/GM VAGINAL CREAM    Place 1 Applicatorful vaginally at bedtime.   FENOFIBRATE (TRICOR) 48 MG TABLET    Take 48 mg by  mouth daily.   GABAPENTIN (NEURONTIN) 100 MG CAPSULE    Take 1 capsule (100 mg total) by mouth at bedtime.   HYDRALAZINE (APRESOLINE) 50 MG TABLET    Take 50 mg by mouth every 8 (eight) hours. 10a. 2p, 6p   INSULIN ASPART (NOVOLOG FLEXPEN) 100 UNIT/ML FLEXPEN    Give 10 units SQ with meals. Give an additional 5 units for CBG over 150  with meals.   INSULIN DEGLUDEC (TRESIBA FLEXTOUCH) 100 UNIT/ML SOPN FLEXTOUCH PEN    Inject 28 Units into the skin at bedtime.    LISINOPRIL (PRINIVIL,ZESTRIL) 5 MG TABLET    Take 5 mg by mouth daily.   LORATADINE (CLARITIN) 10 MG TABLET    Take 10 mg by mouth daily.   LORAZEPAM (ATIVAN) 0.5 MG TABLET    Take 0.5 mg by mouth every 12 (twelve) hours as needed for anxiety. For 60 days. Stop date: 04/02/16   MAGNESIUM HYDROXIDE (MILK OF MAGNESIA) 800 MG/5ML SUSPENSION    Take 30 mLs by mouth daily as needed for constipation. 30cc   MENTHOL, TOPICAL ANALGESIC, (BIOFREEZE) 4 % GEL    Apply to right ankle and bilateral knees three times daily as needed.   MULTIPLE VITAMINS-MINERALS (PRESERVISION AREDS 2+MULTI VIT PO)    Take 1 tablet by mouth 2 (two) times daily.   NITROFURANTOIN, MACROCRYSTAL-MONOHYDRATE, (MACROBID) 100 MG CAPSULE    Take 100 mg by mouth at bedtime.   NYSTATIN (MYCOSTATIN/NYSTOP) POWDER    Apply to groin three times daily as needed for rash, redness, or itching.   OMEGA-3 ACID ETHYL ESTERS (LOVAZA) 1 G CAPSULE    Take 2 g by mouth 2 (two) times daily.   OMEPRAZOLE (PRILOSEC) 20 MG CAPSULE    Take 20 mg by mouth 2 (two) times daily with a meal.   PROMETHAZINE (PHENERGAN) 25 MG TABLET    Take either by mouth, IM injection, or suppository every 6 hours PRN for nausea or vomiting.   PROPYLENE GLYCOL (SYSTANE BALANCE) 0.6 % SOLN    Place 1 drop into both eyes 2 (two) times daily.   ROSUVASTATIN (CRESTOR) 20 MG TABLET    Take 20 mg by mouth daily.   SENNOSIDES-DOCUSATE SODIUM (SENOKOT-S) 8.6-50 MG TABLET    Take 2 tablets by mouth 2 (two) times daily.     SOLIFENACIN (VESICARE) 5 MG TABLET    Take 5 mg by mouth daily.   TRAMADOL (ULTRAM) 50 MG TABLET    Take 1 tablet (50 mg total) by mouth every 8 (eight) hours as needed for moderate pain.   TRAZODONE (DESYREL) 50 MG TABLET    Take 50 mg by mouth at bedtime.    VITAMIN C (ASCORBIC ACID) 500 MG TABLET    Take 500 mg by mouth 2 (two) times daily.    WHEAT DEXTRIN (BENEFIBER) POWD    Take 1 scoop by mouth daily. Mix with 8oz of fluid  Modified Medications   No medications on file  Discontinued Medications   BESIFLOXACIN HCL (BESIVANCE) 0.6 % SUSP    Place 1 drop in the left eye twice a day the day before surgery, morning of, and week after. Stop date: 02/14/16   BROMFENAC SODIUM (PROLENSA) 0.07 % SOLN    Apply 1 drop every day to left eye on day before surgery, morning of, and for 2 weeks after surgery. Stop date: 02/21/16   MENTHOL, TOPICAL ANALGESIC, (BIOFREEZE) 4 % GEL    To right ankle and bilateral knees TID     Physical Exam: Vitals:   03/05/16 1122  BP: (!) 136/98  Pulse: 82  Resp: 17  Temp: 98.6 F (37 C)  SpO2: 99%  Weight: 160 lb (72.6 kg)  Height: 5\' 3"  (1.6 m)    Physical Exam  Constitutional: She is oriented to person, place, and time. She appears well-developed and well-nourished. No distress.  HENT:  Head: Normocephalic and atraumatic.  Eyes: Conjunctivae and  EOM are normal. Pupils are equal, round, and reactive to light.  Stye noted to left upper eyelid, no drainage  Neck: Normal range of motion. Neck supple.  Cardiovascular: Normal rate and regular rhythm.   Murmur heard. Pulmonary/Chest: Effort normal and breath sounds normal. No respiratory distress. She has no wheezes.  Abdominal: Soft. Bowel sounds are normal. She exhibits no distension. There is no tenderness.  Rounded abdomen   Musculoskeletal: She exhibits no edema or tenderness.       Left hand: She exhibits normal range of motion and no tenderness. Normal sensation noted. Normal strength noted.  Right  sided hemiparesis   Lymphadenopathy:    She has no cervical adenopathy.  Neurological: She is alert and oriented to person, place, and time.  Skin: Skin is warm and dry. She is not diaphoretic.  Psychiatric: She has a normal mood and affect.    Labs reviewed: Basic Metabolic Panel:  Recent Labs  10/12/15 1028 10/12/15 1145 12/17/15 0732 01/21/16  NA 128* 128* 135 140  K 4.9 4.7 4.3 4.7  CL 95* 94* 100*  --   CO2 24 24 26   --   GLUCOSE 463* 413* 175*  --   BUN 26* 26* 28* 36*  CREATININE 1.52* 1.51* 1.58* 1.8*  CALCIUM 9.9 9.9 10.2  --    Liver Function Tests:  Recent Labs  06/05/15 0510 06/06/15 0615 10/12/15 1028  AST 19 20 18   ALT 14 13* 23  ALKPHOS 51 50 71  BILITOT 0.3 0.4 0.6  PROT 6.9 6.6 6.8  ALBUMIN 3.1* 2.9* 3.2*   No results for input(s): LIPASE, AMYLASE in the last 8760 hours. No results for input(s): AMMONIA in the last 8760 hours. CBC:  Recent Labs  10/12/15 1145 10/19/15 1408 12/17/15 0732 01/21/16  WBC 7.3 8.5 7.2 7.3  NEUTROABS 4.9 6.3 4.3  --   HGB 10.0* 10.1* 10.2* 10.3*  HCT 31.4* 30.9* 32.8* 32*  MCV 95.2 93.2 94.0  --   PLT 233 280 323 239   TSH: No results for input(s): TSH in the last 8760 hours. A1C: Lab Results  Component Value Date   HGBA1C 7.2 01/09/2016   Lipid Panel:  Recent Labs  01/09/16  CHOL 189  HDL 28*  LDLCALC 115  TRIG 229*    Assessment/Plan 1. Cough and congestion Overall pt does not feel bad, worsening cough and congestion in morning time with increase mucous. Will schedule mucinex DM by mouth twice daily for 1 week then PRN, will get chest xray to evaluate increase congestion  2. Hordeolum externum of left upper eyelid Baby shampoo to left eyelid BID and warm compresses QID until resolved.    Carlos American. Harle Battiest  [facility] [phone] 8 am - 5 pm) [phone] (after hours)

## 2016-03-25 ENCOUNTER — Encounter: Payer: Self-pay | Admitting: Internal Medicine

## 2016-03-26 ENCOUNTER — Encounter: Payer: Self-pay | Admitting: Nurse Practitioner

## 2016-03-26 ENCOUNTER — Non-Acute Institutional Stay (SKILLED_NURSING_FACILITY): Payer: Medicare Other | Admitting: Nurse Practitioner

## 2016-03-26 DIAGNOSIS — R05 Cough: Secondary | ICD-10-CM | POA: Diagnosis not present

## 2016-03-26 DIAGNOSIS — K5909 Other constipation: Secondary | ICD-10-CM

## 2016-03-26 DIAGNOSIS — E785 Hyperlipidemia, unspecified: Secondary | ICD-10-CM | POA: Diagnosis not present

## 2016-03-26 DIAGNOSIS — N183 Chronic kidney disease, stage 3 unspecified: Secondary | ICD-10-CM

## 2016-03-26 DIAGNOSIS — H00014 Hordeolum externum left upper eyelid: Secondary | ICD-10-CM

## 2016-03-26 DIAGNOSIS — R059 Cough, unspecified: Secondary | ICD-10-CM

## 2016-03-26 NOTE — Progress Notes (Signed)
Patient ID: Chelsey Gallagher, female   DOB: Jun 15, 1950, 66 y.o.   MRN: [id number]    Nursing Home Location:  [facility]  Place of Service: SNF (31)  PCP: [name], MD  Allergies  Allergen Reactions  . Flexeril [Cyclobenzaprine Hcl] Anaphylaxis  . Lyrica [Pregabalin] Other (See Comments)    "wires my up"  . Codeine Nausea And Vomiting    Chief Complaint  Patient presents with  . Medical Management of Chronic Issues    Routine Visit    HPI:  Patient is a 66 y.o. female seen today at [facility] for follow up on chronic conditions. Pt with a hx of CVA with spastic hemiplegia, constipation, anxiety, DM, HTN, and hyperlipidemia. Pt reports constipation has gotten really bad. Has not had a BM in several days and had to be disimpacted 4 days ago. Very painful hard BM.  No abdominal pain, nausea or vomiting.  Pt was seen in the last month due to increase cough and congestion. This has improved some but still having cough with clear sputum. No increase in shortness of breath.   Review of Systems:  Review of Systems  Constitutional: Negative for activity change, appetite change, fatigue, fever and unexpected weight change.  HENT: Positive for congestion. Negative for hearing loss and sinus pressure.   Eyes: Negative.  Negative for discharge.  Respiratory: Positive for cough. Negative for shortness of breath and wheezing.   Cardiovascular: Negative for chest pain, palpitations and leg swelling.  Gastrointestinal: Positive for constipation. Negative for abdominal pain, diarrhea and nausea.  Genitourinary: Positive for dysuria and frequency (vesicare helps). Negative for difficulty urinating, pelvic pain and vaginal pain.  Musculoskeletal: Negative for arthralgias.  Skin: Negative for color change and wound.  Neurological: Positive for weakness and numbness (right side). Negative for dizziness, syncope, facial asymmetry, speech difficulty, light-headedness and headaches.    Psychiatric/Behavioral: Negative for behavioral problems and confusion. The patient is not nervous/anxious.     Past Medical History:  Diagnosis Date  . Anxiety   . Aortic stenosis    moderate AS 2014 echo  . Arthritis   . Cataract cortical, senile, left    Dr Gevena Cotton  . Chronic GI bleeding    presumed/notes 06/06/2015  . Chronic pain   . Complication of anesthesia    doesn't wake up good- "usually end up in ICU" also experiences post op nausea and vomiting  . Constipation   . Contracture of elbow   . Contracture of right wrist   . Depression   . Diabetes mellitus   . Diabetic neuropathy (HCC)    Diabetic neuropathy- has inproved since she quit smoking.  . Diabetic neuropathy ([place])   . Difficulty in walking   . Dysphagia, oropharyngeal phase   . Emphysema lung ([place])   . Facial weakness   . Foot drop, left   . Frequent UTI   . GERD (gastroesophageal reflux disease)   . H/O hiatal hernia    second one  . Hearing loss   . Heart murmur    PCP- [facility]- No tx needed.  . Hemiplegia affecting right dominant side ([place])   . History of blood transfusion 06/2015  . History of kidney stones   . Hyperlipidemia 05/14/2011  . Hypertension   . Iron deficiency anemia   . Lung abnormality 05/11/2011  . Occlusion and stenosis of carotid artery   . Orthostatic hypotension    after surgery.  . Orthostatic hypotension   . Overactive bladder   .  PAD (peripheral artery disease) ([place])    S/p bypass grafting, unclear exactly where  . Pneumonia    hx of  . Restless leg syndrome   . Stroke ([place]) 05/04/2011   Weakness on Right. Wears leg brace.  . Urinary incontinence    Past Surgical History:  Procedure Laterality Date  . ABDOMINAL HYSTERECTOMY  2003  . APPENDECTOMY  2011  . BYPASS GRAFT  2003   Abdominal aortic  . CAROTID STENT INSERTION Right 2013   ICA/notes 06/06/2015  . CESAREAN SECTION  1984  . CHOLECYSTECTOMY  1984  . COLONOSCOPY N/A 05/10/2013   Procedure:  COLONOSCOPY;  Surgeon: Lafayette Dragon, MD;  Location: [facility] ENDOSCOPY;  Service: Endoscopy;  Laterality: N/A;  . ESOPHAGOGASTRODUODENOSCOPY N/A 05/09/2013   Procedure: ESOPHAGOGASTRODUODENOSCOPY (EGD);  Surgeon: Lafayette Dragon, MD;  Location: [facility] ENDOSCOPY;  Service: Endoscopy;  Laterality: N/A;  . HERNIA REPAIR  2011  . IR GENERIC HISTORICAL  12/17/2015   IR ANGIO INTRA EXTRACRAN SEL COM CAROTID INNOMINATE BILAT MOD SED 12/17/2015 [facility]INTER[facility]  . IR GENERIC HISTORICAL  12/17/2015   IR ANGIO VERTEBRAL SEL VERTEBRAL UNI R MOD SED 12/17/2015 [facility]  . JOINT REPLACEMENT Right   . [place]  . LITHOTRIPSY    . RADIOLOGY WITH ANESTHESIA N/A 12/17/2015   Procedure: RADIOLOGY WITH ANESTHESIA STENT PLACMENT;  Surgeon: Medication Radiologist, MD;  Location: [place];  Service: Radiology;  Laterality: N/A;  . TOTAL HIP ARTHROPLASTY  2008   Right   Social History:   reports that she has quit smoking. Her smoking use included Cigarettes. She smoked 0.00 packs per day for 45.00 years. She quit smokeless tobacco use about 4 years ago. She reports that she does not drink alcohol or use drugs.  Family History  Problem Relation Age of Onset  . Cancer Maternal Aunt     breast    Medications: Patient's Medications  New Prescriptions   No medications on file  Previous Medications   ACETAMINOPHEN (TYLENOL) 325 MG TABLET    Take 650 mg by mouth every 6 (six) hours as needed for pain.   ALBUTEROL (PROVENTIL) (2.5 MG/3ML) 0.083% NEBULIZER SOLUTION    Take 3 mLs (2.5 mg total) by nebulization every 4 (four) hours as needed for wheezing or shortness of breath.   AMINO ACIDS-PROTEIN HYDROLYS (FEEDING SUPPLEMENT, PRO-STAT SUGAR FREE 64,) LIQD    Take 30 mLs by mouth daily. At 1700   AMLODIPINE (NORVASC) 5 MG TABLET    Take 1 tablet (5 mg total) by mouth daily.   ASPIRIN 81 MG CHEWABLE TABLET    Chew 81 mg by mouth daily.   BACLOFEN (LIORESAL) 5 MG TABS TABLET    Take 2.5 mg by mouth every 12  (twelve) hours as needed for muscle spasms.   BISACODYL (DULCOLAX) 10 MG SUPPOSITORY    Place 10 mg rectally daily as needed for moderate constipation.   CHOLECALCIFEROL (VITAMIN D) 1000 UNITS TABLET    Take 1,000 Units by mouth daily.   CLOPIDOGREL (PLAVIX) 75 MG TABLET    Take 75 mg by mouth at bedtime.   DULOXETINE (CYMBALTA) 60 MG CAPSULE    Take 60 mg by mouth daily.   ESTRADIOL (ESTRACE) 0.1 MG/GM VAGINAL CREAM    Place 1 Applicatorful vaginally at bedtime.   FENOFIBRATE (TRICOR) 48 MG TABLET    Take 48 mg by mouth daily.   GABAPENTIN (NEURONTIN) 100 MG CAPSULE    Take 1 capsule (100 mg total) by  mouth at bedtime.   HYDRALAZINE (APRESOLINE) 50 MG TABLET    Take 50 mg by mouth every 8 (eight) hours. 10a. 2p, 6p   INSULIN ASPART (NOVOLOG FLEXPEN) 100 UNIT/ML FLEXPEN    Give 10 units SQ with meals. Give an additional 5 units for CBG over 150 with meals.   INSULIN DEGLUDEC (TRESIBA FLEXTOUCH) 100 UNIT/ML SOPN FLEXTOUCH PEN    Inject 28 Units into the skin at bedtime.    LISINOPRIL (PRINIVIL,ZESTRIL) 5 MG TABLET    Take 5 mg by mouth daily.   LORATADINE (CLARITIN) 10 MG TABLET    Take 10 mg by mouth daily.   LORAZEPAM (ATIVAN) 0.5 MG TABLET    Take 0.5 mg by mouth every 12 (twelve) hours as needed for anxiety. For 60 days. Stop date: 04/02/16   MAGNESIUM HYDROXIDE (MILK OF MAGNESIA) 800 MG/5ML SUSPENSION    Take 30 mLs by mouth daily as needed for constipation. 30cc   MENTHOL, TOPICAL ANALGESIC, (BIOFREEZE) 4 % GEL    Apply to right ankle and bilateral knees three times daily as needed.   MULTIPLE VITAMINS-MINERALS (PRESERVISION AREDS 2+MULTI VIT PO)    Take 1 tablet by mouth 2 (two) times daily.   NITROFURANTOIN, MACROCRYSTAL-MONOHYDRATE, (MACROBID) 100 MG CAPSULE    Take 100 mg by mouth at bedtime.   NYSTATIN (MYCOSTATIN/NYSTOP) POWDER    Apply to groin three times daily as needed for rash, redness, or itching.   OMEGA-3 ACID ETHYL ESTERS (LOVAZA) 1 G CAPSULE    Take 2 g by mouth 2 (two) times  daily.   OMEPRAZOLE (PRILOSEC) 20 MG CAPSULE    Take 20 mg by mouth 2 (two) times daily with a meal.   PROMETHAZINE (PHENERGAN) 25 MG TABLET    Take either by mouth, IM injection, or suppository every 6 hours PRN for nausea or vomiting.   PROPYLENE GLYCOL (SYSTANE BALANCE) 0.6 % SOLN    Place 1 drop into both eyes 2 (two) times daily.   ROSUVASTATIN (CRESTOR) 20 MG TABLET    Take 20 mg by mouth daily.   SENNOSIDES-DOCUSATE SODIUM (SENOKOT-S) 8.6-50 MG TABLET    Take 2 tablets by mouth 2 (two) times daily.    SOLIFENACIN (VESICARE) 5 MG TABLET    Take 5 mg by mouth daily.   TRAMADOL (ULTRAM) 50 MG TABLET    Take 1 tablet (50 mg total) by mouth every 8 (eight) hours as needed for moderate pain.   TRAZODONE (DESYREL) 50 MG TABLET    Take 50 mg by mouth at bedtime.    VITAMIN C (ASCORBIC ACID) 500 MG TABLET    Take 500 mg by mouth 2 (two) times daily.    WHEAT DEXTRIN (BENEFIBER) POWD    Take 1 scoop by mouth daily. Mix with 8oz of fluid  Modified Medications   No medications on file  Discontinued Medications   DIFLUPREDNATE (DUREZOL) 0.05 % EMUL    Instill 1 drop twice a day in left eye for 1 month. Stop date: 03/08/16     Physical Exam: Vitals:   03/26/16 1115  BP: 140/78  Pulse: 72  Resp: 20  Temp: 97.1 F (36.2 C)  SpO2: 99%  Weight: 160 lb (72.6 kg)  Height: 5\' 3"  (1.6 m)    Physical Exam  Constitutional: She is oriented to person, place, and time. She appears well-developed and well-nourished. No distress.  HENT:  Head: Normocephalic and atraumatic.  Eyes: Conjunctivae and EOM are normal. Pupils are equal, round, and reactive to  light.  Stye noted to left upper eyelid, no drainage  Neck: Normal range of motion. Neck supple.  Cardiovascular: Normal rate and regular rhythm.   Murmur heard. Pulmonary/Chest: Effort normal and breath sounds normal. No respiratory distress. She has no wheezes.  Abdominal: Soft. Bowel sounds are normal. She exhibits no distension. There is no  tenderness.  Rounded abdomen   Musculoskeletal: She exhibits no edema or tenderness.       Left hand: She exhibits normal range of motion and no tenderness. Normal sensation noted. Normal strength noted.  Right sided hemiparesis   Lymphadenopathy:    She has no cervical adenopathy.  Neurological: She is alert and oriented to person, place, and time.  Skin: Skin is warm and dry. She is not diaphoretic.  Psychiatric: She has a normal mood and affect.    Labs reviewed: Basic Metabolic Panel:  Recent Labs  10/12/15 1028 10/12/15 1145 12/17/15 0732 01/21/16  NA 128* 128* 135 140  K 4.9 4.7 4.3 4.7  CL 95* 94* 100*  --   CO2 24 24 26   --   GLUCOSE 463* 413* 175*  --   BUN 26* 26* 28* 36*  CREATININE 1.52* 1.51* 1.58* 1.8*  CALCIUM 9.9 9.9 10.2  --    Liver Function Tests:  Recent Labs  06/05/15 0510 06/06/15 0615 10/12/15 1028  AST 19 20 18   ALT 14 13* 23  ALKPHOS 51 50 71  BILITOT 0.3 0.4 0.6  PROT 6.9 6.6 6.8  ALBUMIN 3.1* 2.9* 3.2*   No results for input(s): LIPASE, AMYLASE in the last 8760 hours. No results for input(s): AMMONIA in the last 8760 hours. CBC:  Recent Labs  10/12/15 1145 10/19/15 1408 12/17/15 0732 01/21/16  WBC 7.3 8.5 7.2 7.3  NEUTROABS 4.9 6.3 4.3  --   HGB 10.0* 10.1* 10.2* 10.3*  HCT 31.4* 30.9* 32.8* 32*  MCV 95.2 93.2 94.0  --   PLT 233 280 323 239   TSH: No results for input(s): TSH in the last 8760 hours. A1C: Lab Results  Component Value Date   HGBA1C 7.2 01/09/2016   Lipid Panel:  Recent Labs  01/09/16 03/04/16  CHOL 189 117  HDL 28* 35  LDLCALC 115 55  TRIG 229* 139    Assessment/Plan 1. Cough Has improved, still with cough and congestion mostly in morning  2. Hordeolum externum of left upper eyelid resolved  3. Chronic constipation Worse, will add linzess 145 mcg daily  4. Dyslipidemia, goal LDL below 70 LDL at goal on crestor, will cont current regimen  5. CKD (chronic kidney disease) stage 3, GFR 30-59  ml/min Will follow up labs at this time. Medications have been renal adjusted. To avoid NSAIDs    Carlos American. Harle Battiest  [facility] [phone] 8 am - 5 pm) [phone] (after hours)

## 2016-03-27 DIAGNOSIS — D500 Iron deficiency anemia secondary to blood loss (chronic): Secondary | ICD-10-CM | POA: Diagnosis not present

## 2016-03-27 DIAGNOSIS — I10 Essential (primary) hypertension: Secondary | ICD-10-CM | POA: Diagnosis not present

## 2016-03-27 DIAGNOSIS — M6281 Muscle weakness (generalized): Secondary | ICD-10-CM | POA: Diagnosis not present

## 2016-03-27 LAB — HEPATIC FUNCTION PANEL
ALT: 11 U/L (ref 7–35)
AST: 12 U/L — AB (ref 13–35)
Alkaline Phosphatase: 87 U/L (ref 25–125)
Bilirubin, Total: 0.3 mg/dL

## 2016-03-27 LAB — BASIC METABOLIC PANEL
BUN: 41 mg/dL — AB (ref 4–21)
CREATININE: 1.7 mg/dL — AB (ref 0.5–1.1)
GLUCOSE: 151 mg/dL
POTASSIUM: 4.8 mmol/L (ref 3.4–5.3)
Sodium: 132 mmol/L — AB (ref 137–147)

## 2016-03-28 ENCOUNTER — Other Ambulatory Visit: Payer: Self-pay

## 2016-03-28 MED ORDER — LORAZEPAM 0.5 MG PO TABS: ORAL_TABLET | ORAL | 0 refills | Status: DC

## 2016-03-28 NOTE — Telephone Encounter (Signed)
Prescription request was received from:    [facility] [address]  [place] [address]  Phone: [phone] Fax: [phone] 

## 2016-03-31 ENCOUNTER — Other Ambulatory Visit: Payer: Self-pay | Admitting: *Deleted

## 2016-03-31 MED ORDER — LORAZEPAM 0.5 MG PO TABS: ORAL_TABLET | ORAL | 0 refills | Status: DC

## 2016-03-31 NOTE — Telephone Encounter (Signed)
[facility] [phone] Fax: [phone]  

## 2016-04-08 DIAGNOSIS — R300 Dysuria: Secondary | ICD-10-CM | POA: Diagnosis not present

## 2016-04-08 DIAGNOSIS — N3941 Urge incontinence: Secondary | ICD-10-CM | POA: Diagnosis not present

## 2016-04-08 DIAGNOSIS — N3020 Other chronic cystitis without hematuria: Secondary | ICD-10-CM | POA: Diagnosis not present

## 2016-04-18 ENCOUNTER — Other Ambulatory Visit (HOSPITAL_BASED_OUTPATIENT_CLINIC_OR_DEPARTMENT_OTHER): Payer: Medicare Other

## 2016-04-18 ENCOUNTER — Telehealth: Payer: Self-pay | Admitting: Oncology

## 2016-04-18 ENCOUNTER — Ambulatory Visit (HOSPITAL_BASED_OUTPATIENT_CLINIC_OR_DEPARTMENT_OTHER): Payer: Medicare Other | Admitting: Oncology

## 2016-04-18 ENCOUNTER — Telehealth: Payer: Self-pay | Admitting: *Deleted

## 2016-04-18 VITALS — BP 99/52 | HR 86 | Temp 98.7°F | Resp 16

## 2016-04-18 DIAGNOSIS — N289 Disorder of kidney and ureter, unspecified: Secondary | ICD-10-CM

## 2016-04-18 DIAGNOSIS — D500 Iron deficiency anemia secondary to blood loss (chronic): Secondary | ICD-10-CM

## 2016-04-18 DIAGNOSIS — D509 Iron deficiency anemia, unspecified: Secondary | ICD-10-CM

## 2016-04-18 LAB — CBC WITH DIFFERENTIAL/PLATELET
BASO%: 0.4 % (ref 0.0–2.0)
BASOS ABS: 0 10*3/uL (ref 0.0–0.1)
EOS%: 2.1 % (ref 0.0–7.0)
Eosinophils Absolute: 0.2 10*3/uL (ref 0.0–0.5)
HEMATOCRIT: 27.1 % — AB (ref 34.8–46.6)
HGB: 8.8 g/dL — ABNORMAL LOW (ref 11.6–15.9)
LYMPH#: 2 10*3/uL (ref 0.9–3.3)
LYMPH%: 25.5 % (ref 14.0–49.7)
MCH: 29 pg (ref 25.1–34.0)
MCHC: 32.6 g/dL (ref 31.5–36.0)
MCV: 88.8 fL (ref 79.5–101.0)
MONO#: 0.5 10*3/uL (ref 0.1–0.9)
MONO%: 6.1 % (ref 0.0–14.0)
NEUT#: 5.1 10*3/uL (ref 1.5–6.5)
NEUT%: 65.9 % (ref 38.4–76.8)
Platelets: 258 10*3/uL (ref 145–400)
RBC: 3.05 10*6/uL — AB (ref 3.70–5.45)
RDW: 18.3 % — ABNORMAL HIGH (ref 11.2–14.5)
WBC: 7.7 10*3/uL (ref 3.9–10.3)

## 2016-04-18 LAB — COMPREHENSIVE METABOLIC PANEL
ALBUMIN: 3 g/dL — AB (ref 3.5–5.0)
ALK PHOS: 78 U/L (ref 40–150)
ALT: 13 U/L (ref 0–55)
AST: 15 U/L (ref 5–34)
Anion Gap: 10 mEq/L (ref 3–11)
BILIRUBIN TOTAL: 0.27 mg/dL (ref 0.20–1.20)
BUN: 46.8 mg/dL — ABNORMAL HIGH (ref 7.0–26.0)
CO2: 22 mEq/L (ref 22–29)
CREATININE: 1.6 mg/dL — AB (ref 0.6–1.1)
Calcium: 10 mg/dL (ref 8.4–10.4)
Chloride: 103 mEq/L (ref 98–109)
EGFR: 33 mL/min/{1.73_m2} — AB (ref >90)
GLUCOSE: 222 mg/dL — AB (ref 70–140)
Potassium: 4.8 mEq/L (ref 3.5–5.1)
Sodium: 135 mEq/L — ABNORMAL LOW (ref 136–145)
TOTAL PROTEIN: 7.3 g/dL (ref 6.4–8.3)

## 2016-04-18 LAB — IRON AND TIBC
%SAT: 16 % — ABNORMAL LOW (ref 21–57)
Iron: 42 ug/dL (ref 41–142)
TIBC: 268 ug/dL (ref 236–444)
UIBC: 225 ug/dL (ref 120–384)

## 2016-04-18 LAB — FERRITIN: Ferritin: 117 ng/ml (ref 9–269)

## 2016-04-18 NOTE — Telephone Encounter (Signed)
Per 2/16 LOS I have scheduled appt. Notified the scheduler

## 2016-04-18 NOTE — Progress Notes (Signed)
Hematology and Oncology Follow Up Visit  Chelsey Gallagher [id number] Oct 01, 1950 66 y.o. 04/18/2016 2:45 PM Chelsey Gallagher, MDCarter, [place], [place]   Principle Diagnosis:  66 year old woman with iron deficiency anemia as well as anemia of renal disease. She was diagnosed in April  2017.   Prior Therapy: She is status post IV iron infusion in January 2017.  Current therapy: Intermittent IV iron therapy. Iron has been ineffective.  Interim History:  Chelsey Gallagher presents today for a follow-up visit. Since the last visit, she reports feeling reasonably well without any recent complaints. She denied any excessive fatigue or tiredness. He denied any dyspnea on exertion. She reports she has not taken any oral iron at this time. She does report some occasional excessive sleepiness at times which could be a sign of worsening anemia. She denied any hematochezia or melena. She denied any other bleeding symptoms.  Her performance status and quality of life is not dramatically changed. She does use a motorized scooter to ambulate. She still resides in a skilled nursing facility on a permanent basis.   She does not report any headaches, blurry vision, syncope or seizures. She does not report any fevers, chills, sweats or weight loss. She does not report any chest pain, palpitation, orthopnea or leg edema. She does not report any cough, wheezing or hemoptysis. Does not report any nausea, vomiting or abdominal pain. She does not report any frequency urgency or hesitancy. She does not report any skeletal complaints. Remaining review of systems unremarkable.   Medications: I have reviewed the patient's current medications.  Current Outpatient Prescriptions  Medication Sig Dispense Refill  . acetaminophen (TYLENOL) 325 MG tablet Take 650 mg by mouth every 6 (six) hours as needed for pain.    Marland Kitchen albuterol (PROVENTIL) (2.5 MG/3ML) 0.083% nebulizer solution Take 3 mLs (2.5 mg total) by nebulization every 4 (four) hours as  needed for wheezing or shortness of breath. 75 mL 12  . Amino Acids-Protein Hydrolys (FEEDING SUPPLEMENT, PRO-STAT SUGAR FREE 64,) LIQD Take 30 mLs by mouth daily. At 1700    . amLODipine (NORVASC) 5 MG tablet Take 1 tablet (5 mg total) by mouth daily.    Marland Kitchen aspirin 81 MG chewable tablet Chew 81 mg by mouth daily.    . baclofen (LIORESAL) 5 mg TABS tablet Take 2.5 mg by mouth every 12 (twelve) hours as needed for muscle spasms.    . bisacodyl (DULCOLAX) 10 MG suppository Place 10 mg rectally daily as needed for moderate constipation.    . cholecalciferol (VITAMIN D) 1000 UNITS tablet Take 1,000 Units by mouth daily.    . clopidogrel (PLAVIX) 75 MG tablet Take 75 mg by mouth at bedtime.    . DULoxetine (CYMBALTA) 60 MG capsule Take 60 mg by mouth daily.    Marland Kitchen estradiol (ESTRACE) 0.1 MG/GM vaginal cream Place 1 Applicatorful vaginally at bedtime.    . fenofibrate (TRICOR) 48 MG tablet Take 48 mg by mouth daily.    Marland Kitchen gabapentin (NEURONTIN) 100 MG capsule Take 1 capsule (100 mg total) by mouth at bedtime.    . hydrALAZINE (APRESOLINE) 50 MG tablet Take 50 mg by mouth every 8 (eight) hours. 10a. 2p, 6p    . insulin aspart (NOVOLOG FLEXPEN) 100 UNIT/ML FlexPen Give 10 units SQ with meals. Give an additional 5 units for CBG over 150 with meals.    . insulin degludec (TRESIBA FLEXTOUCH) 100 UNIT/ML SOPN FlexTouch Pen Inject 28 Units into the skin at bedtime.     Marland Kitchen  lisinopril (PRINIVIL,ZESTRIL) 5 MG tablet Take 5 mg by mouth daily.    Marland Kitchen loratadine (CLARITIN) 10 MG tablet Take 10 mg by mouth daily.    Marland Kitchen LORazepam (ATIVAN) 0.5 MG tablet Take 1/2 tablet by mouth every morning for anxiety (control); Take one tablet by mouth at bedtime for rest (control) 45 tablet 0  . magnesium hydroxide (MILK OF MAGNESIA) 800 MG/5ML suspension Take 30 mLs by mouth daily as needed for constipation. 30cc    . Menthol, Topical Analgesic, (BIOFREEZE) 4 % GEL Apply to right ankle and bilateral knees three times daily as needed.     . Multiple Vitamins-Minerals (PRESERVISION AREDS 2+MULTI VIT PO) Take 1 tablet by mouth 2 (two) times daily.    . nitrofurantoin, macrocrystal-monohydrate, (MACROBID) 100 MG capsule Take 100 mg by mouth at bedtime.    Marland Kitchen nystatin (MYCOSTATIN/NYSTOP) powder Apply to groin three times daily as needed for rash, redness, or itching.    . omega-3 acid ethyl esters (LOVAZA) 1 g capsule Take 2 g by mouth 2 (two) times daily.    Marland Kitchen omeprazole (PRILOSEC) 20 MG capsule Take 20 mg by mouth 2 (two) times daily with a meal.    . promethazine (PHENERGAN) 25 MG tablet Take either by mouth, IM injection, or suppository every 6 hours PRN for nausea or vomiting.    Marland Kitchen Propylene Glycol (SYSTANE BALANCE) 0.6 % SOLN Place 1 drop into both eyes 2 (two) times daily.    . rosuvastatin (CRESTOR) 20 MG tablet Take 20 mg by mouth daily.    . sennosides-docusate sodium (SENOKOT-S) 8.6-50 MG tablet Take 2 tablets by mouth 2 (two) times daily.     . solifenacin (VESICARE) 5 MG tablet Take 5 mg by mouth daily.    . traMADol (ULTRAM) 50 MG tablet Take 1 tablet (50 mg total) by mouth every 8 (eight) hours as needed for moderate pain. 90 tablet 0  . traZODone (DESYREL) 50 MG tablet Take 50 mg by mouth at bedtime.     . vitamin C (ASCORBIC ACID) 500 MG tablet Take 500 mg by mouth 2 (two) times daily.     . Wheat Dextrin (BENEFIBER) POWD Take 1 scoop by mouth daily. Mix with 8oz of fluid     No current facility-administered medications for this visit.      Allergies:  Allergies  Allergen Reactions  . Flexeril [Cyclobenzaprine Hcl] Anaphylaxis  . Lyrica [Pregabalin] Other (See Comments)    "wires my up"  . Codeine Nausea And Vomiting    Past Medical History, Surgical history, Social history, and Family History were reviewed and updated.   Physical Exam: Blood pressure (!) 99/52, pulse 86, temperature 98.7 F (37.1 C), temperature source Oral, resp. rate 16, SpO2 94 %. ECOG: 2 General appearance: alert and cooperative  appeared without distress. Head: Normocephalic, without obvious abnormality no oral ulcers or lesions. Neck: no adenopathy Lymph nodes: Cervical, supraclavicular, and axillary nodes normal. Heart:regular rate and rhythm, S1, S2 normal, no murmur, click, rub or gallop Lung:chest clear, no wheezing, rales, normal symmetric air entry Abdomin: soft, non-tender, without masses or organomegaly no shifting dullness or ascites. EXT:no erythema, induration, or nodules   Lab Results: Lab Results  Component Value Date   WBC 7.7 04/18/2016   HGB 8.8 (L) 04/18/2016   HCT 27.1 (L) 04/18/2016   MCV 88.8 04/18/2016   PLT 258 04/18/2016     Chemistry      Component Value Date/Time   NA 132 (A) 03/27/2016   K  4.8 03/27/2016   CL 100 (L) 12/17/2015 0732   CO2 26 12/17/2015 0732   BUN 41 (A) 03/27/2016   CREATININE 1.7 (A) 03/27/2016   CREATININE 1.58 (H) 12/17/2015 0732   GLU 151 03/27/2016      Component Value Date/Time   CALCIUM 10.2 12/17/2015 0732   ALKPHOS 87 03/27/2016   AST 12 (A) 03/27/2016   ALT 11 03/27/2016   BILITOT 0.6 10/12/2015 1028        Impression and Plan:    66 year old woman with the following issues:  1. Iron deficiency anemia dating back to 2013. At that time her iron was less than 10 and a hemoglobin as low as 8.4. She had fluctuations in her hemoglobin and have required intravenous iron periodically.  Her hemoglobin has drifted back again today and she is mildly symptomatic. She has been intolerant to oral iron which also has been an effective. Risks and benefits of IV iron infusion were reviewed and she is agreeable to proceed with that. She will receive a total dose of 510 mg of Feraheme. We will repeat iron studies in 6 months and retreatment as needed.  2. Renal insufficiency: It is also contributing to her mild anemia and she could benefit from growth factor support with her iron stores are completely depleted.  3. Follow-up: Will be in 6 months to  recheck her iron stores.   Zola Button, MD 2/16/20182:45 PM

## 2016-04-18 NOTE — Telephone Encounter (Signed)
Appointments scheduled per 2/16 LOS. Patient given AVS report and calendars with future scheduled appointments. °

## 2016-04-23 ENCOUNTER — Other Ambulatory Visit: Payer: Self-pay | Admitting: *Deleted

## 2016-04-23 ENCOUNTER — Telehealth: Payer: Self-pay | Admitting: *Deleted

## 2016-04-23 MED ORDER — LORAZEPAM 0.5 MG PO TABS: ORAL_TABLET | ORAL | 0 refills | Status: DC

## 2016-04-23 NOTE — Telephone Encounter (Signed)
Per patient request I have moved appt from Friday to Monday. Patient aware

## 2016-04-23 NOTE — Telephone Encounter (Signed)
[facility] [phone] Fax: [phone]  

## 2016-04-25 ENCOUNTER — Encounter: Payer: Self-pay | Admitting: Nurse Practitioner

## 2016-04-25 ENCOUNTER — Ambulatory Visit: Payer: Medicare Other

## 2016-04-25 ENCOUNTER — Non-Acute Institutional Stay (SKILLED_NURSING_FACILITY): Payer: Medicare Other | Admitting: Nurse Practitioner

## 2016-04-25 DIAGNOSIS — R634 Abnormal weight loss: Secondary | ICD-10-CM | POA: Diagnosis not present

## 2016-04-25 DIAGNOSIS — F331 Major depressive disorder, recurrent, moderate: Secondary | ICD-10-CM

## 2016-04-25 DIAGNOSIS — K5909 Other constipation: Secondary | ICD-10-CM

## 2016-04-25 DIAGNOSIS — N3281 Overactive bladder: Secondary | ICD-10-CM | POA: Diagnosis not present

## 2016-04-25 DIAGNOSIS — D500 Iron deficiency anemia secondary to blood loss (chronic): Secondary | ICD-10-CM | POA: Diagnosis not present

## 2016-04-25 NOTE — Progress Notes (Signed)
Patient ID: Chelsey Gallagher, female   DOB: Dec 25, 1950, 66 y.o.   MRN: [id number]    Nursing Home Location:  [facility]  Place of Service: SNF (31)  PCP: [name], MD  Allergies  Allergen Reactions  . Flexeril [Cyclobenzaprine Hcl] Anaphylaxis  . Lyrica [Pregabalin] Other (See Comments)    "wires my up"  . Codeine Nausea And Vomiting    Chief Complaint  Patient presents with  . Medical Management of Chronic Issues    Resident is being seen for follow up on chronic constipation, dyslipidemia, CKD-stage 3    HPI:  Patient is a 66 y.o. female seen today at [facility] for follow up on chronic conditions. Pt with a hx of CVA with spastic hemiplegia, constipation, anxiety, DM, HTN, and hyperlipidemia. Pt has continual complaints of constipation, although is better from last month with the addition of linzess. She is having hard stools every other day, although has not needed manual disimpaction since last month. Decreased appetite, weight has decreased 9 pounds (151lb) over the last month. Previous weights were stable in the 160'slb. Reports moderate fluid intake. Denies nausea or vomiting.  She also complains of dysuria and urge and functional incontinence. She states that this is an ongoing problem, likely related to her immobility, constipation, and large umbilical hernia. She is able to make it to the bathroom, although difficult considering her CVA and decreased functional mobility. She currently wears briefs for her accidents. Continues Macrobid and Estrace cream prophyalactic for this problem.  She has reported increases in lethargy and disinterest in previously enjoyable activities over the last month. Reports that she used to be interested in playing bingo, watching Tv, reading, and playing on her computer. These activities have been difficult for her lately. Admits to slight increase in depression and loss of interest. Denies suicidal ideation. Pt currently on  cymbalta. Positive for decreased appetite and weight loss.   Review of Systems:  Review of Systems  Constitutional: Positive for activity change, appetite change, fatigue and unexpected weight change. Negative for chills.  HENT: Negative for congestion, hearing loss, sinus pressure and sore throat.   Eyes: Negative for visual disturbance.  Respiratory: Negative for apnea, cough, chest tightness, shortness of breath and wheezing.   Cardiovascular: Negative for chest pain, palpitations and leg swelling.  Gastrointestinal: Positive for abdominal distention and constipation. Negative for abdominal pain, blood in stool, diarrhea, nausea and vomiting.  Genitourinary: Positive for difficulty urinating, dysuria and frequency (vesicare helps). Negative for hematuria, pelvic pain and vaginal pain.  Musculoskeletal: Negative for arthralgias and myalgias.  Skin: Negative for color change and wound.  Neurological: Positive for weakness. Negative for dizziness, syncope, facial asymmetry, speech difficulty, light-headedness and headaches. Numbness: right side.  Psychiatric/Behavioral: Negative for behavioral problems and confusion. The patient is not nervous/anxious.     Past Medical History:  Diagnosis Date  . Anxiety   . Aortic stenosis    moderate AS 2014 echo  . Arthritis   . Cataract cortical, senile, left    Dr Gevena Cotton  . Chronic GI bleeding    presumed/notes 06/06/2015  . Chronic pain   . Complication of anesthesia    doesn't wake up good- "usually end up in ICU" also experiences post op nausea and vomiting  . Constipation   . Contracture of elbow   . Contracture of right wrist   . Depression   . Diabetes mellitus   . Diabetic neuropathy (HCC)    Diabetic neuropathy- has inproved since she  quit smoking.  . Diabetic neuropathy ([place])   . Difficulty in walking   . Dysphagia, oropharyngeal phase   . Emphysema lung ([place])   . Facial weakness   . Foot drop, left   . Frequent UTI   .  GERD (gastroesophageal reflux disease)   . H/O hiatal hernia    second one  . Hearing loss   . Heart murmur    PCP- [facility]- No tx needed.  . Hemiplegia affecting right dominant side ([place])   . History of blood transfusion 06/2015  . History of kidney stones   . Hyperlipidemia 05/14/2011  . Hypertension   . Iron deficiency anemia   . Lung abnormality 05/11/2011  . Occlusion and stenosis of carotid artery   . Orthostatic hypotension    after surgery.  . Orthostatic hypotension   . Overactive bladder   . PAD (peripheral artery disease) ([place])    S/p bypass grafting, unclear exactly where  . Pneumonia    hx of  . Restless leg syndrome   . Stroke ([place]) 05/04/2011   Weakness on Right. Wears leg brace.  . Urinary incontinence    Past Surgical History:  Procedure Laterality Date  . ABDOMINAL HYSTERECTOMY  2003  . APPENDECTOMY  2011  . BYPASS GRAFT  2003   Abdominal aortic  . CAROTID STENT INSERTION Right 2013   ICA/notes 06/06/2015  . CESAREAN SECTION  1984  . CHOLECYSTECTOMY  1984  . COLONOSCOPY N/A 05/10/2013   Procedure: COLONOSCOPY;  Surgeon: Lafayette Dragon, MD;  Location: [facility] ENDOSCOPY;  Service: Endoscopy;  Laterality: N/A;  . ESOPHAGOGASTRODUODENOSCOPY N/A 05/09/2013   Procedure: ESOPHAGOGASTRODUODENOSCOPY (EGD);  Surgeon: Lafayette Dragon, MD;  Location: [facility] ENDOSCOPY;  Service: Endoscopy;  Laterality: N/A;  . HERNIA REPAIR  2011  . IR GENERIC HISTORICAL  12/17/2015   IR ANGIO INTRA EXTRACRAN SEL COM CAROTID INNOMINATE BILAT MOD SED 12/17/2015 [facility]-INTERV RAD  . IR GENERIC HISTORICAL  12/17/2015   IR ANGIO VERTEBRAL SEL VERTEBRAL UNI R MOD SED 12/17/2015 [facility]INTER[facility] RAD  . JOINT REPLACEMENT Right   . [place]  . LITHOTRIPSY    . RADIOLOGY WITH ANESTHESIA N/A 12/17/2015   Procedure: RADIOLOGY WITH ANESTHESIA STENT PLACMENT;  Surgeon: Medication Radiologist, MD;  Location: [place];  Service: Radiology;  Laterality: N/A;  . TOTAL HIP ARTHROPLASTY  2008    Right   Social History:   reports that she has quit smoking. Her smoking use included Cigarettes. She smoked 0.00 packs per day for 45.00 years. She quit smokeless tobacco use about 4 years ago. She reports that she does not drink alcohol or use drugs.  Family History  Problem Relation Age of Onset  . Cancer Maternal Aunt     breast    Medications: Patient's Medications  New Prescriptions   No medications on file  Previous Medications   ACETAMINOPHEN (TYLENOL) 325 MG TABLET    Take 650 mg by mouth every 6 (six) hours as needed for pain.   ALBUTEROL (PROVENTIL) (2.5 MG/3ML) 0.083% NEBULIZER SOLUTION    Take 3 mLs (2.5 mg total) by nebulization every 4 (four) hours as needed for wheezing or shortness of breath.   AMINO ACIDS-PROTEIN HYDROLYS (FEEDING SUPPLEMENT, PRO-STAT SUGAR FREE 64,) LIQD    Take 30 mLs by mouth daily. At 1700   AMLODIPINE (NORVASC) 5 MG TABLET    Take 1 tablet (5 mg total) by mouth daily.   ASPIRIN 81 MG CHEWABLE TABLET    Chew  81 mg by mouth daily.   BACLOFEN (LIORESAL) 5 MG TABS TABLET    Take 2.5 mg by mouth every 12 (twelve) hours as needed for muscle spasms.   BISACODYL (DULCOLAX) 10 MG SUPPOSITORY    Place 10 mg rectally daily as needed for moderate constipation.   CHOLECALCIFEROL (VITAMIN D) 1000 UNITS TABLET    Take 1,000 Units by mouth daily.   CLOPIDOGREL (PLAVIX) 75 MG TABLET    Take 75 mg by mouth at bedtime.   DULOXETINE (CYMBALTA) 60 MG CAPSULE    Take 60 mg by mouth daily.   DUREZOL 0.05 % EMUL    Place 1 drop into the right eye 2 (two) times daily. For one month. Start 05/02/16 and stop 06/02/16.   ESTRADIOL (ESTRACE) 0.1 MG/GM VAGINAL CREAM    Place 1 Applicatorful vaginally at bedtime.   FENOFIBRATE (TRICOR) 48 MG TABLET    Take 48 mg by mouth daily.   GABAPENTIN (NEURONTIN) 100 MG CAPSULE    Take 1 capsule (100 mg total) by mouth at bedtime.   HYDRALAZINE (APRESOLINE) 50 MG TABLET    Take 50 mg by mouth every 8 (eight) hours. 10a. 2p, 6p   INSULIN  ASPART (NOVOLOG FLEXPEN) 100 UNIT/ML FLEXPEN    Give 10 units SQ with meals. Give an additional 5 units for CBG over 150 with meals.   INSULIN DEGLUDEC (TRESIBA FLEXTOUCH) 100 UNIT/ML SOPN FLEXTOUCH PEN    Inject 28 Units into the skin at bedtime.    LINACLOTIDE (LINZESS) 145 MCG CAPS CAPSULE    Take 145 mcg by mouth daily before breakfast.   LISINOPRIL (PRINIVIL,ZESTRIL) 5 MG TABLET    Take 5 mg by mouth daily.   LORATADINE (CLARITIN) 10 MG TABLET    Take 10 mg by mouth daily.   LORAZEPAM (ATIVAN) 0.5 MG TABLET    Take 1/2 tablet by mouth every morning for anxiety (control); Take one tablet by mouth at bedtime for rest (control)   MAGNESIUM HYDROXIDE (MILK OF MAGNESIA) 800 MG/5ML SUSPENSION    Take 30 mLs by mouth daily as needed for constipation. 30cc   MENTHOL, TOPICAL ANALGESIC, (BIOFREEZE) 4 % GEL    Apply to right ankle and bilateral knees three times daily as needed.   MULTIPLE VITAMINS-MINERALS (PRESERVISION AREDS 2+MULTI VIT PO)    Take 1 tablet by mouth 2 (two) times daily.   NITROFURANTOIN, MACROCRYSTAL-MONOHYDRATE, (MACROBID) 100 MG CAPSULE    Take 100 mg by mouth at bedtime.   NYSTATIN (MYCOSTATIN/NYSTOP) POWDER    Apply to groin three times daily as needed for rash, redness, or itching.   OMEGA-3 ACID ETHYL ESTERS (LOVAZA) 1 G CAPSULE    Take 2 g by mouth 2 (two) times daily.   OMEPRAZOLE (PRILOSEC) 20 MG CAPSULE    Take 20 mg by mouth 2 (two) times daily with a meal.   PROMETHAZINE (PHENERGAN) 25 MG TABLET    Take either by mouth, IM injection, or suppository every 6 hours PRN for nausea or vomiting.   PROPYLENE GLYCOL (SYSTANE BALANCE) 0.6 % SOLN    Place 1 drop into both eyes 2 (two) times daily.   ROSUVASTATIN (CRESTOR) 20 MG TABLET    Take 20 mg by mouth daily.   SENNOSIDES-DOCUSATE SODIUM (SENOKOT-S) 8.6-50 MG TABLET    Take 2 tablets by mouth 2 (two) times daily.    SOLIFENACIN (VESICARE) 5 MG TABLET    Take 5 mg by mouth daily.   TRAMADOL (ULTRAM) 50 MG TABLET  Take 1 tablet  (50 mg total) by mouth every 8 (eight) hours as needed for moderate pain.   TRAZODONE (DESYREL) 50 MG TABLET    Take 50 mg by mouth at bedtime.    VITAMIN C (ASCORBIC ACID) 500 MG TABLET    Take 500 mg by mouth 2 (two) times daily.    WHEAT DEXTRIN (BENEFIBER) POWD    Take 1 scoop by mouth daily. Mix with 8oz of fluid  Modified Medications   No medications on file  Discontinued Medications   BESIVANCE 0.6 % SUSP       DOXYCYCLINE (VIBRAMYCIN) 100 MG CAPSULE       PROLENSA 0.07 % SOLN    Place 1 drop into the right eye daily. Stop Date: 05/01/16     Physical Exam: Vitals:   04/25/16 0927  BP: (!) 101/56  Pulse: 90  Resp: 16  Temp: 97.3 F (36.3 C)  SpO2: 95%  Weight: 151 lb (68.5 kg)  Height: 5\' 3"  (1.6 m)    Physical Exam  Constitutional: She is oriented to person, place, and time. She appears well-developed and well-nourished. No distress.  + Weight loss  HENT:  Head: Normocephalic and atraumatic.  Right Ear: External ear normal.  Left Ear: External ear normal.  Mouth/Throat: Oropharynx is clear and moist.  Eyes: Conjunctivae and EOM are normal. Pupils are equal, round, and reactive to light.  Neck: Normal range of motion. Neck supple. No JVD present.  Cardiovascular: Normal rate and regular rhythm.   Murmur heard. Pulmonary/Chest: Effort normal and breath sounds normal. No respiratory distress. She has no wheezes.  Abdominal: Soft. Bowel sounds are normal. She exhibits no distension. There is tenderness. There is no rebound.  Rounded abdomen   Genitourinary:  Genitourinary Comments: Reports dysuria with functional/urge incontinence  Musculoskeletal: She exhibits no edema or tenderness.       Left hand: She exhibits normal range of motion and no tenderness. Normal sensation noted. Normal strength noted.  Right sided hemiparesis with mild upper extremity contracture. Uses wheelchair for ambulation. Grip and leg strength on left 5+/5+, 3+/3+ on right  Lymphadenopathy:     She has no cervical adenopathy.  Neurological: She is alert and oriented to person, place, and time.  Skin: Skin is warm and dry. She is not diaphoretic.  Psychiatric: She has a normal mood and affect.  Reports of anhedonia over the last 1 month    Labs reviewed: Basic Metabolic Panel:  Recent Labs  10/12/15 1028 10/12/15 1145 12/17/15 0732 01/21/16 03/27/16 04/18/16 1415  NA 128* 128* 135 140 132* 135*  K 4.9 4.7 4.3 4.7 4.8 4.8  CL 95* 94* 100*  --   --   --   CO2 24 24 26   --   --  22  GLUCOSE 463* 413* 175*  --   --  222*  BUN 26* 26* 28* 36* 41* 46.8*  CREATININE 1.52* 1.51* 1.58* 1.8* 1.7* 1.6*  CALCIUM 9.9 9.9 10.2  --   --  10.0   Liver Function Tests:  Recent Labs  06/06/15 0615 10/12/15 1028 03/27/16 04/18/16 1415  AST 20 18 12* 15  ALT 13* 23 11 13   ALKPHOS 50 71 87 78  BILITOT 0.4 0.6  --  0.27  PROT 6.6 6.8  --  7.3  ALBUMIN 2.9* 3.2*  --  3.0*   No results for input(s): LIPASE, AMYLASE in the last 8760 hours. No results for input(s): AMMONIA in the last 8760 hours. CBC:  Recent Labs  10/19/15 1408 12/17/15 0732 01/21/16 04/18/16 1415  WBC 8.5 7.2 7.3 7.7  NEUTROABS 6.3 4.3  --  5.1  HGB 10.1* 10.2* 10.3* 8.8*  HCT 30.9* 32.8* 32* 27.1*  MCV 93.2 94.0  --  88.8  PLT 280 323 239 258   TSH: No results for input(s): TSH in the last 8760 hours. A1C: Lab Results  Component Value Date   HGBA1C 7.2 01/09/2016   Lipid Panel:  Recent Labs  01/09/16 03/04/16  CHOL 189 117  HDL 28* 35  LDLCALC 115 55  TRIG 229* 139    Assessment/Plan  1. Dysuria -Continuing. Pt remains on macrobid prophylactic.  -ongoing follow up by urology  -Encouraged to increase oral fluids and prompt toileting.  -Staff to monitor for increases in temp, foul smelling urine, or increases in confusion.     2. Moderate episode of recurrent major depressive disorder (HCC) -Pt currently taking Cymbalta at max dose -Reluctant to add another medication due to  polypharmacy, will follow up labs at this time and have psych evaluate for worsening depression.  3. Chronic constipation -Continuing. Pt currently taking linzess and dulcolax. Will add miralax 17 gm twice daily for 3 days then daily.  -Encouraged to increase mobility, fluids, and eating if possible   4. Weight loss -Weight has decreased 9lbs. Over the last 1 month. 160>151. She reports decreased appetite related to depression? Constipation? Thyroid? Anemia? -CBC stable and followed by hematology -Will check TSH, Vit D levels -Addressing depression as stated above -Encouraged to increase oral intake  -Will monitor per staff for further loss.  5. Anemia Following with hematology due to iron def anemia, drop in hgb on recent labs, will recheck to follow trend due to hgb decrease from 10 to 8.8  Alasha Mcguinness K. Harle Battiest  [facility] [phone] 8 am - 5 pm) [phone] (after hours)

## 2016-04-25 NOTE — Progress Notes (Deleted)
Patient ID: Chelsey Gallagher, female   DOB: 12/17/1950, 66 y.o.   MRN: [id number]    Nursing Home Location:  [facility]  Place of Service: SNF (31)  PCP: [name], MD  Allergies  Allergen Reactions  . Flexeril [Cyclobenzaprine Hcl] Anaphylaxis  . Lyrica [Pregabalin] Other (See Comments)    "wires my up"  . Codeine Nausea And Vomiting    Chief Complaint  Patient presents with  . Medical Management of Chronic Issues    Resident is being seen for follow up on chronic constipation, dyslipidemia, CKD-stage 3    HPI:  Patient is a 66 y.o. female seen today at [facility] for follow up on chronic conditions. Pt with a hx of CVA with spastic hemiplegia, constipation, anxiety, DM, HTN, and hyperlipidemia. Pt has continual complaints of constipation, although is better from last month with the addition of linzess. She is having hard stools every other day and has not needed manual disimpaction since last month. Decreased appetite. Reports moderate fluid intake. Denies nausea or vomiting.  She also complains of dysuria and urge and functional incontinence. She states that this is an ongoing problem, likely related to her immobility, constipation, and large umbilical hernia. She is able to make it to the bathroom, although difficult considering her CVA and decreased functional mobility. She currently wears briefs for her accidents. Continues Macrobid and Estrace cream prophyalactic for this problem.  She has reproted increases in lethargy and disinterest in previous activities over the last month. Reports that she used to be interested in playing bingo, watching Tv, reading, and playing on her computer. These activities have been difficult for her lately. Admits to slight increase in depression and loss of interest. Denies Si.       Review of Systems:  Review of Systems  Constitutional: Negative for activity change, appetite change, fatigue, fever and unexpected weight change.   HENT: Positive for congestion. Negative for hearing loss and sinus pressure.   Eyes: Negative.  Negative for discharge.  Respiratory: Positive for cough. Negative for shortness of breath and wheezing.   Cardiovascular: Negative for chest pain, palpitations and leg swelling.  Gastrointestinal: Positive for constipation. Negative for abdominal pain, diarrhea and nausea.  Genitourinary: Positive for dysuria and frequency (vesicare helps). Negative for difficulty urinating, pelvic pain and vaginal pain.  Musculoskeletal: Negative for arthralgias.  Skin: Negative for color change and wound.  Neurological: Positive for weakness and numbness (right side). Negative for dizziness, syncope, facial asymmetry, speech difficulty, light-headedness and headaches.  Psychiatric/Behavioral: Negative for behavioral problems and confusion. The patient is not nervous/anxious.     Past Medical History:  Diagnosis Date  . Anxiety   . Aortic stenosis    moderate AS 2014 echo  . Arthritis   . Cataract cortical, senile, left    Dr Gevena Cotton  . Chronic GI bleeding    presumed/notes 06/06/2015  . Chronic pain   . Complication of anesthesia    doesn't wake up good- "usually end up in ICU" also experiences post op nausea and vomiting  . Constipation   . Contracture of elbow   . Contracture of right wrist   . Depression   . Diabetes mellitus   . Diabetic neuropathy (HCC)    Diabetic neuropathy- has inproved since she quit smoking.  . Diabetic neuropathy ([place])   . Difficulty in walking   . Dysphagia, oropharyngeal phase   . Emphysema lung ([place])   . Facial weakness   . Foot drop, left   .  Frequent UTI   . GERD (gastroesophageal reflux disease)   . H/O hiatal hernia    second one  . Hearing loss   . Heart murmur    PCP- [facility]- No tx needed.  . Hemiplegia affecting right dominant side ([place])   . History of blood transfusion 06/2015  . History of kidney stones   . Hyperlipidemia 05/14/2011   . Hypertension   . Iron deficiency anemia   . Lung abnormality 05/11/2011  . Occlusion and stenosis of carotid artery   . Orthostatic hypotension    after surgery.  . Orthostatic hypotension   . Overactive bladder   . PAD (peripheral artery disease) ([place])    S/p bypass grafting, unclear exactly where  . Pneumonia    hx of  . Restless leg syndrome   . Stroke ([place]) 05/04/2011   Weakness on Right. Wears leg brace.  . Urinary incontinence    Past Surgical History:  Procedure Laterality Date  . ABDOMINAL HYSTERECTOMY  2003  . APPENDECTOMY  2011  . BYPASS GRAFT  2003   Abdominal aortic  . CAROTID STENT INSERTION Right 2013   ICA/notes 06/06/2015  . CESAREAN SECTION  1984  . CHOLECYSTECTOMY  1984  . COLONOSCOPY N/A 05/10/2013   Procedure: COLONOSCOPY;  Surgeon: Lafayette Dragon, MD;  Location: [facility] ENDOSCOPY;  Service: Endoscopy;  Laterality: N/A;  . ESOPHAGOGASTRODUODENOSCOPY N/A 05/09/2013   Procedure: ESOPHAGOGASTRODUODENOSCOPY (EGD);  Surgeon: Lafayette Dragon, MD;  Location: [facility] ENDOSCOPY;  Service: Endoscopy;  Laterality: N/A;  . HERNIA REPAIR  2011  . IR GENERIC HISTORICAL  12/17/2015   IR ANGIO INTRA EXTRACRAN SEL COM CAROTID INNOMINATE BILAT MOD SED 12/17/2015 [facility]-INTERV RAD  . IR GENERIC HISTORICAL  12/17/2015   IR ANGIO VERTEBRAL SEL VERTEBRAL UNI R MOD SED 12/17/2015 [facility]-INTERV RAD  . JOINT REPLACEMENT Right   . [place]  . LITHOTRIPSY    . RADIOLOGY WITH ANESTHESIA N/A 12/17/2015   Procedure: RADIOLOGY WITH ANESTHESIA STENT PLACMENT;  Surgeon: Medication Radiologist, MD;  Location: [facility];  Service: Radiology;  Laterality: N/A;  . TOTAL HIP ARTHROPLASTY  2008   Right   Social History:   reports that she has quit smoking. Her smoking use included Cigarettes. She smoked 0.00 packs per day for 45.00 years. She quit smokeless tobacco use about 4 years ago. She reports that she does not drink alcohol or use drugs.  Family History  Problem Relation Age of Onset  .  Cancer Maternal Aunt     breast    Medications: Patient's Medications  New Prescriptions   No medications on file  Previous Medications   ACETAMINOPHEN (TYLENOL) 325 MG TABLET    Take 650 mg by mouth every 6 (six) hours as needed for pain.   ALBUTEROL (PROVENTIL) (2.5 MG/3ML) 0.083% NEBULIZER SOLUTION    Take 3 mLs (2.5 mg total) by nebulization every 4 (four) hours as needed for wheezing or shortness of breath.   AMINO ACIDS-PROTEIN HYDROLYS (FEEDING SUPPLEMENT, PRO-STAT SUGAR FREE 64,) LIQD    Take 30 mLs by mouth daily. At 1700   AMLODIPINE (NORVASC) 5 MG TABLET    Take 1 tablet (5 mg total) by mouth daily.   ASPIRIN 81 MG CHEWABLE TABLET    Chew 81 mg by mouth daily.   BACLOFEN (LIORESAL) 5 MG TABS TABLET    Take 2.5 mg by mouth every 12 (twelve) hours as needed for muscle spasms.   BISACODYL (DULCOLAX) 10 MG SUPPOSITORY  Place 10 mg rectally daily as needed for moderate constipation.   CHOLECALCIFEROL (VITAMIN D) 1000 UNITS TABLET    Take 1,000 Units by mouth daily.   CLOPIDOGREL (PLAVIX) 75 MG TABLET    Take 75 mg by mouth at bedtime.   DULOXETINE (CYMBALTA) 60 MG CAPSULE    Take 60 mg by mouth daily.   DUREZOL 0.05 % EMUL    Place 1 drop into the right eye 2 (two) times daily. For one month. Start 05/02/16 and stop 06/02/16.   ESTRADIOL (ESTRACE) 0.1 MG/GM VAGINAL CREAM    Place 1 Applicatorful vaginally at bedtime.   FENOFIBRATE (TRICOR) 48 MG TABLET    Take 48 mg by mouth daily.   GABAPENTIN (NEURONTIN) 100 MG CAPSULE    Take 1 capsule (100 mg total) by mouth at bedtime.   HYDRALAZINE (APRESOLINE) 50 MG TABLET    Take 50 mg by mouth every 8 (eight) hours. 10a. 2p, 6p   INSULIN ASPART (NOVOLOG FLEXPEN) 100 UNIT/ML FLEXPEN    Give 10 units SQ with meals. Give an additional 5 units for CBG over 150 with meals.   INSULIN DEGLUDEC (TRESIBA FLEXTOUCH) 100 UNIT/ML SOPN FLEXTOUCH PEN    Inject 28 Units into the skin at bedtime.    LINACLOTIDE (LINZESS) 145 MCG CAPS CAPSULE    Take 145 mcg by  mouth daily before breakfast.   LISINOPRIL (PRINIVIL,ZESTRIL) 5 MG TABLET    Take 5 mg by mouth daily.   LORATADINE (CLARITIN) 10 MG TABLET    Take 10 mg by mouth daily.   LORAZEPAM (ATIVAN) 0.5 MG TABLET    Take 1/2 tablet by mouth every morning for anxiety (control); Take one tablet by mouth at bedtime for rest (control)   MAGNESIUM HYDROXIDE (MILK OF MAGNESIA) 800 MG/5ML SUSPENSION    Take 30 mLs by mouth daily as needed for constipation. 30cc   MENTHOL, TOPICAL ANALGESIC, (BIOFREEZE) 4 % GEL    Apply to right ankle and bilateral knees three times daily as needed.   MULTIPLE VITAMINS-MINERALS (PRESERVISION AREDS 2+MULTI VIT PO)    Take 1 tablet by mouth 2 (two) times daily.   NITROFURANTOIN, MACROCRYSTAL-MONOHYDRATE, (MACROBID) 100 MG CAPSULE    Take 100 mg by mouth at bedtime.   NYSTATIN (MYCOSTATIN/NYSTOP) POWDER    Apply to groin three times daily as needed for rash, redness, or itching.   OMEGA-3 ACID ETHYL ESTERS (LOVAZA) 1 G CAPSULE    Take 2 g by mouth 2 (two) times daily.   OMEPRAZOLE (PRILOSEC) 20 MG CAPSULE    Take 20 mg by mouth 2 (two) times daily with a meal.   PROMETHAZINE (PHENERGAN) 25 MG TABLET    Take either by mouth, IM injection, or suppository every 6 hours PRN for nausea or vomiting.   PROPYLENE GLYCOL (SYSTANE BALANCE) 0.6 % SOLN    Place 1 drop into both eyes 2 (two) times daily.   ROSUVASTATIN (CRESTOR) 20 MG TABLET    Take 20 mg by mouth daily.   SENNOSIDES-DOCUSATE SODIUM (SENOKOT-S) 8.6-50 MG TABLET    Take 2 tablets by mouth 2 (two) times daily.    SOLIFENACIN (VESICARE) 5 MG TABLET    Take 5 mg by mouth daily.   TRAMADOL (ULTRAM) 50 MG TABLET    Take 1 tablet (50 mg total) by mouth every 8 (eight) hours as needed for moderate pain.   TRAZODONE (DESYREL) 50 MG TABLET    Take 50 mg by mouth at bedtime.    VITAMIN C (ASCORBIC  ACID) 500 MG TABLET    Take 500 mg by mouth 2 (two) times daily.    WHEAT DEXTRIN (BENEFIBER) POWD    Take 1 scoop by mouth daily. Mix with 8oz  of fluid  Modified Medications   No medications on file  Discontinued Medications   BESIVANCE 0.6 % SUSP       DOXYCYCLINE (VIBRAMYCIN) 100 MG CAPSULE       PROLENSA 0.07 % SOLN    Place 1 drop into the right eye daily. Stop Date: 05/01/16     Physical Exam: Vitals:   04/25/16 0927  BP: (!) 101/56  Pulse: 90  Resp: 16  Temp: 97.3 F (36.3 C)  SpO2: 95%  Weight: 151 lb (68.5 kg)  Height: 5\' 3"  (1.6 m)    Physical Exam  Constitutional: She is oriented to person, place, and time. She appears well-developed and well-nourished. No distress.  HENT:  Head: Normocephalic and atraumatic.  Eyes: Conjunctivae and EOM are normal. Pupils are equal, round, and reactive to light.  Stye noted to left upper eyelid, no drainage  Neck: Normal range of motion. Neck supple.  Cardiovascular: Normal rate and regular rhythm.   Murmur heard. Pulmonary/Chest: Effort normal and breath sounds normal. No respiratory distress. She has no wheezes.  Abdominal: Soft. Bowel sounds are normal. She exhibits no distension. There is no tenderness.  Rounded abdomen   Musculoskeletal: She exhibits no edema or tenderness.       Left hand: She exhibits normal range of motion and no tenderness. Normal sensation noted. Normal strength noted.  Right sided hemiparesis   Lymphadenopathy:    She has no cervical adenopathy.  Neurological: She is alert and oriented to person, place, and time.  Skin: Skin is warm and dry. She is not diaphoretic.  Psychiatric: She has a normal mood and affect.    Labs reviewed: Basic Metabolic Panel:  Recent Labs  10/12/15 1028 10/12/15 1145 12/17/15 0732 01/21/16 03/27/16 04/18/16 1415  NA 128* 128* 135 140 132* 135*  K 4.9 4.7 4.3 4.7 4.8 4.8  CL 95* 94* 100*  --   --   --   CO2 24 24 26   --   --  22  GLUCOSE 463* 413* 175*  --   --  222*  BUN 26* 26* 28* 36* 41* 46.8*  CREATININE 1.52* 1.51* 1.58* 1.8* 1.7* 1.6*  CALCIUM 9.9 9.9 10.2  --   --  10.0   Liver Function  Tests:  Recent Labs  06/06/15 0615 10/12/15 1028 03/27/16 04/18/16 1415  AST 20 18 12* 15  ALT 13* 23 11 13   ALKPHOS 50 71 87 78  BILITOT 0.4 0.6  --  0.27  PROT 6.6 6.8  --  7.3  ALBUMIN 2.9* 3.2*  --  3.0*   No results for input(s): LIPASE, AMYLASE in the last 8760 hours. No results for input(s): AMMONIA in the last 8760 hours. CBC:  Recent Labs  10/19/15 1408 12/17/15 0732 01/21/16 04/18/16 1415  WBC 8.5 7.2 7.3 7.7  NEUTROABS 6.3 4.3  --  5.1  HGB 10.1* 10.2* 10.3* 8.8*  HCT 30.9* 32.8* 32* 27.1*  MCV 93.2 94.0  --  88.8  PLT 280 323 239 258   TSH: No results for input(s): TSH in the last 8760 hours. A1C: Lab Results  Component Value Date   HGBA1C 7.2 01/09/2016   Lipid Panel:  Recent Labs  01/09/16 03/04/16  CHOL 189 117  HDL 28* 35  LDLCALC 115  55  TRIG 229* 139    Assessment/Plan 1. Cough Has improved, still with cough and congestion mostly in morning  2. Hordeolum externum of left upper eyelid resolved  3. Chronic constipation Worse, will add linzess 145 mcg daily  4. Dyslipidemia, goal LDL below 70 LDL at goal on crestor, will cont current regimen  5. CKD (chronic kidney disease) stage 3, GFR 30-59 ml/min Will follow up labs at this time. Medications have been renal adjusted. To avoid NSAIDs    Carlos American. Harle Battiest  [facility] [phone] 8 am - 5 pm) [phone] (after hours)

## 2016-04-28 ENCOUNTER — Ambulatory Visit (HOSPITAL_BASED_OUTPATIENT_CLINIC_OR_DEPARTMENT_OTHER): Payer: Medicare Other

## 2016-04-28 VITALS — BP 94/53 | HR 85 | Temp 98.2°F | Resp 16

## 2016-04-28 DIAGNOSIS — D500 Iron deficiency anemia secondary to blood loss (chronic): Secondary | ICD-10-CM | POA: Diagnosis present

## 2016-04-28 MED ORDER — SODIUM CHLORIDE 0.9 % IV SOLN
510.0000 mg | Freq: Once | INTRAVENOUS | Status: AC
  Administered 2016-04-28: 510 mg via INTRAVENOUS
  Filled 2016-04-28: qty 17

## 2016-04-28 MED ORDER — SODIUM CHLORIDE 0.9 % IV SOLN
Freq: Once | INTRAVENOUS | Status: AC
  Administered 2016-04-28: 15:00:00 via INTRAVENOUS

## 2016-04-28 NOTE — Patient Instructions (Signed)
Ferumoxytol injection What is this medicine? FERUMOXYTOL is an iron complex. Iron is used to make healthy red blood cells, which carry oxygen and nutrients throughout the body. This medicine is used to treat iron deficiency anemia in people with chronic kidney disease. COMMON BRAND NAME(S): Feraheme What should I tell my health care provider before I take this medicine? They need to know if you have any of these conditions: -anemia not caused by low iron levels -high levels of iron in the blood -magnetic resonance imaging (MRI) test scheduled -an unusual or allergic reaction to iron, other medicines, foods, dyes, or preservatives -pregnant or trying to get pregnant -breast-feeding How should I use this medicine? This medicine is for injection into a vein. It is given by a health care professional in a hospital or clinic setting. Talk to your pediatrician regarding the use of this medicine in children. Special care may be needed. What if I miss a dose? It is important not to miss your dose. Call your doctor or health care professional if you are unable to keep an appointment. What may interact with this medicine? This medicine may interact with the following medications: -other iron products What should I watch for while using this medicine? Visit your doctor or healthcare professional regularly. Tell your doctor or healthcare professional if your symptoms do not start to get better or if they get worse. You may need blood work done while you are taking this medicine. You may need to follow a special diet. Talk to your doctor. Foods that contain iron include: whole grains/cereals, dried fruits, beans, or peas, leafy green vegetables, and organ meats (liver, kidney). What side effects may I notice from receiving this medicine? Side effects that you should report to your doctor or health care professional as soon as possible: -allergic reactions like skin rash, itching or hives, swelling of the  face, lips, or tongue -breathing problems -changes in blood pressure -feeling faint or lightheaded, falls -fever or chills -flushing, sweating, or hot feelings -swelling of the ankles or feet Side effects that usually do not require medical attention (report to your doctor or health care professional if they continue or are bothersome): -diarrhea -headache -nausea, vomiting -stomach pain Where should I keep my medicine? This drug is given in a hospital or clinic and will not be stored at home.  2017 Elsevier/Gold Standard (2015-03-22 12:41:49)  

## 2016-04-28 NOTE — Progress Notes (Signed)
Pt tolerated infusion well.

## 2016-04-29 DIAGNOSIS — I951 Orthostatic hypotension: Secondary | ICD-10-CM | POA: Diagnosis not present

## 2016-04-29 DIAGNOSIS — I10 Essential (primary) hypertension: Secondary | ICD-10-CM | POA: Diagnosis not present

## 2016-04-29 DIAGNOSIS — E1140 Type 2 diabetes mellitus with diabetic neuropathy, unspecified: Secondary | ICD-10-CM | POA: Diagnosis not present

## 2016-04-29 DIAGNOSIS — E559 Vitamin D deficiency, unspecified: Secondary | ICD-10-CM | POA: Diagnosis not present

## 2016-04-29 DIAGNOSIS — D509 Iron deficiency anemia, unspecified: Secondary | ICD-10-CM | POA: Diagnosis not present

## 2016-04-29 LAB — CBC AND DIFFERENTIAL
HCT: 28 % — AB (ref 36–46)
Hemoglobin: 8.8 g/dL — AB (ref 12.0–16.0)
PLATELETS: 301 10*3/uL (ref 150–399)
WBC: 9.9 10*3/mL

## 2016-04-29 LAB — VITAMIN D 25 HYDROXY (VIT D DEFICIENCY, FRACTURES): Vit D, 25-Hydroxy: 33.56

## 2016-04-29 LAB — TSH: TSH: 0.27 u[IU]/mL — AB (ref 0.41–5.90)

## 2016-04-30 DIAGNOSIS — G4700 Insomnia, unspecified: Secondary | ICD-10-CM | POA: Diagnosis not present

## 2016-04-30 DIAGNOSIS — F411 Generalized anxiety disorder: Secondary | ICD-10-CM | POA: Diagnosis not present

## 2016-04-30 DIAGNOSIS — F339 Major depressive disorder, recurrent, unspecified: Secondary | ICD-10-CM | POA: Diagnosis not present

## 2016-05-06 DIAGNOSIS — I69351 Hemiplegia and hemiparesis following cerebral infarction affecting right dominant side: Secondary | ICD-10-CM | POA: Diagnosis not present

## 2016-05-06 DIAGNOSIS — R1312 Dysphagia, oropharyngeal phase: Secondary | ICD-10-CM | POA: Diagnosis not present

## 2016-05-06 DIAGNOSIS — G8111 Spastic hemiplegia affecting right dominant side: Secondary | ICD-10-CM | POA: Diagnosis not present

## 2016-05-07 ENCOUNTER — Non-Acute Institutional Stay (SKILLED_NURSING_FACILITY): Payer: Medicare Other | Admitting: Nurse Practitioner

## 2016-05-07 ENCOUNTER — Encounter: Payer: Self-pay | Admitting: Nurse Practitioner

## 2016-05-07 DIAGNOSIS — K5909 Other constipation: Secondary | ICD-10-CM | POA: Diagnosis not present

## 2016-05-07 DIAGNOSIS — G8111 Spastic hemiplegia affecting right dominant side: Secondary | ICD-10-CM | POA: Diagnosis not present

## 2016-05-07 DIAGNOSIS — R1031 Right lower quadrant pain: Secondary | ICD-10-CM | POA: Diagnosis not present

## 2016-05-07 DIAGNOSIS — R1084 Generalized abdominal pain: Secondary | ICD-10-CM | POA: Diagnosis not present

## 2016-05-07 DIAGNOSIS — R1312 Dysphagia, oropharyngeal phase: Secondary | ICD-10-CM | POA: Diagnosis not present

## 2016-05-07 DIAGNOSIS — I69351 Hemiplegia and hemiparesis following cerebral infarction affecting right dominant side: Secondary | ICD-10-CM | POA: Diagnosis not present

## 2016-05-07 NOTE — Progress Notes (Signed)
Patient ID: Chelsey Gallagher, female   DOB: 06/28/1950, 66 y.o.   MRN: [id number]    Nursing Home Location:  [facility]  Place of Service: SNF (31)  PCP: [name], MD  Allergies  Allergen Reactions  . Flexeril [Cyclobenzaprine Hcl] Anaphylaxis  . Lyrica [Pregabalin] Other (See Comments)    "wires my up"  . Codeine Nausea And Vomiting    Chief Complaint  Patient presents with  . Acute Visit    Resident is being seen for right sided abdominal pain.     HPI:  Patient is a 66 y.o. female seen today at [facility] for right sided pain. Pt with a hx of CVA with spastic hemiplegia, constipation, anxiety, DM, HTN, and hyperlipidemia. Staff reports pt has been complaining of right sided abdominal pain for about a week. No association to eating or bowels.  Pt reports she has cont to be constipated as well as having right sided abdominal pain. Pt with hx of total hysterectomy, appendectomy, cholecystectomy. Pt has remained in bed most of the time over the last few weeks per staff. Pt denies increase in anxiety or depression. Pt with ongoing dysuria but no changes in these symptoms. No fevers or chills.   Review of Systems:  Review of Systems  Constitutional: Positive for activity change, appetite change, fatigue and unexpected weight change. Negative for chills.  HENT: Negative for congestion, hearing loss, sinus pressure and sore throat.   Eyes: Negative for visual disturbance.  Respiratory: Negative for apnea, cough, chest tightness, shortness of breath and wheezing.   Cardiovascular: Negative for chest pain, palpitations and leg swelling.  Gastrointestinal: Positive for abdominal pain (right sided) and constipation. Negative for abdominal distention, blood in stool, diarrhea, nausea and vomiting.  Genitourinary: Positive for difficulty urinating, dysuria and frequency (vesicare helps). Negative for hematuria, pelvic pain and vaginal pain.  Musculoskeletal: Negative for  arthralgias and myalgias.  Skin: Negative for color change and wound.  Neurological: Positive for weakness. Negative for dizziness, syncope, facial asymmetry, speech difficulty, light-headedness and headaches. Numbness: right side.  Psychiatric/Behavioral: Negative for behavioral problems and confusion. The patient is not nervous/anxious.     Past Medical History:  Diagnosis Date  . Anxiety   . Aortic stenosis    moderate AS 2014 echo  . Arthritis   . Cataract cortical, senile, left    Dr Gevena Cotton  . Chronic GI bleeding    presumed/notes 06/06/2015  . Chronic pain   . Complication of anesthesia    doesn't wake up good- "usually end up in ICU" also experiences post op nausea and vomiting  . Constipation   . Contracture of elbow   . Contracture of right wrist   . Depression   . Diabetes mellitus   . Diabetic neuropathy (HCC)    Diabetic neuropathy- has inproved since she quit smoking.  . Diabetic neuropathy ([place])   . Difficulty in walking   . Dysphagia, oropharyngeal phase   . Emphysema lung ([place])   . Facial weakness   . Foot drop, left   . Frequent UTI   . GERD (gastroesophageal reflux disease)   . H/O hiatal hernia    second one  . Hearing loss   . Heart murmur    [facility]- [facility]- No tx needed.  . Hemiplegia affecting right dominant side ([place])   . History of blood transfusion 06/2015  . History of kidney stones   . Hyperlipidemia 05/14/2011  . Hypertension   . Iron deficiency anemia   .  Lung abnormality 05/11/2011  . Occlusion and stenosis of carotid artery   . Orthostatic hypotension    after surgery.  . Orthostatic hypotension   . Overactive bladder   . PAD (peripheral artery disease) ([place])    S/p bypass grafting, unclear exactly where  . Pneumonia    hx of  . Restless leg syndrome   . Stroke ([place]) 05/04/2011   Weakness on Right. Wears leg brace.  . Urinary incontinence    Past Surgical History:  Procedure Laterality Date  . ABDOMINAL  HYSTERECTOMY  2003  . APPENDECTOMY  2011  . BYPASS GRAFT  2003   Abdominal aortic  . CAROTID STENT INSERTION Right 2013   ICA/notes 06/06/2015  . CESAREAN SECTION  1984  . CHOLECYSTECTOMY  1984  . COLONOSCOPY N/A 05/10/2013   Procedure: COLONOSCOPY;  Surgeon: Lafayette Dragon, MD;  Location: [facility] ENDOSCOPY;  Service: Endoscopy;  Laterality: N/A;  . ESOPHAGOGASTRODUODENOSCOPY N/A 05/09/2013   Procedure: ESOPHAGOGASTRODUODENOSCOPY (EGD);  Surgeon: Lafayette Dragon, MD;  Location: [facility] ENDOSCOPY;  Service: Endoscopy;  Laterality: N/A;  . HERNIA REPAIR  2011  . IR GENERIC HISTORICAL  12/17/2015   IR ANGIO INTRA EXTRACRAN SEL COM CAROTID INNOMINATE BILAT MOD SED 12/17/2015 [facility]-INTERV RAD  . IR GENERIC HISTORICAL  12/17/2015   IR ANGIO VERTEBRAL SEL VERTEBRAL UNI R MOD SED 12/17/2015 [facility]-INTERV RAD  . JOINT REPLACEMENT Right   . [place]  . LITHOTRIPSY    . RADIOLOGY WITH ANESTHESIA N/A 12/17/2015   Procedure: RADIOLOGY WITH ANESTHESIA STENT PLACMENT;  Surgeon: Medication Radiologist, MD;  Location: [facility];  Service: Radiology;  Laterality: N/A;  . TOTAL HIP ARTHROPLASTY  2008   Right   Social History:   reports that she has quit smoking. Her smoking use included Cigarettes. She smoked 0.00 packs per day for 45.00 years. She quit smokeless tobacco use about 5 years ago. She reports that she does not drink alcohol or use drugs.  Family History  Problem Relation Age of Onset  . Cancer Maternal Aunt     breast    Medications: Patient's Medications  New Prescriptions   No medications on file  Previous Medications   ACETAMINOPHEN (TYLENOL) 325 MG TABLET    Take 650 mg by mouth every 6 (six) hours as needed for pain.   ALBUTEROL (PROVENTIL) (2.5 MG/3ML) 0.083% NEBULIZER SOLUTION    Take 3 mLs (2.5 mg total) by nebulization every 4 (four) hours as needed for wheezing or shortness of breath.   AMINO ACIDS-PROTEIN HYDROLYS (FEEDING SUPPLEMENT, PRO-STAT SUGAR FREE 64,) LIQD    Take 30 mLs by  mouth daily. At 1700   AMLODIPINE (NORVASC) 5 MG TABLET    Take 1 tablet (5 mg total) by mouth daily.   ASPIRIN 81 MG CHEWABLE TABLET    Chew 81 mg by mouth daily.   BISACODYL (DULCOLAX) 10 MG SUPPOSITORY    Place 10 mg rectally daily as needed for moderate constipation.   CHOLECALCIFEROL (VITAMIN D) 1000 UNITS TABLET    Take 1,000 Units by mouth daily.   CLOPIDOGREL (PLAVIX) 75 MG TABLET    Take 75 mg by mouth at bedtime.   DULOXETINE (CYMBALTA) 60 MG CAPSULE    Take 60 mg by mouth daily.   DUREZOL 0.05 % EMUL    Place 1 drop into the right eye 2 (two) times daily. For one month. Start 05/02/16 and stop 06/02/16.   ESTRADIOL (ESTRACE) 0.1 MG/GM VAGINAL CREAM    Place 1 Applicatorful  vaginally at bedtime.   FENOFIBRATE (TRICOR) 48 MG TABLET    Take 48 mg by mouth daily.   GABAPENTIN (NEURONTIN) 100 MG CAPSULE    Take 1 capsule (100 mg total) by mouth at bedtime.   HYDRALAZINE (APRESOLINE) 50 MG TABLET    Take 50 mg by mouth every 8 (eight) hours. 10a. 2p, 6p   INSULIN ASPART (NOVOLOG FLEXPEN) 100 UNIT/ML FLEXPEN    Give 10 units SQ with meals. Give an additional 5 units for CBG over 150 with meals.   INSULIN DEGLUDEC (TRESIBA FLEXTOUCH) 100 UNIT/ML SOPN FLEXTOUCH PEN    Inject 28 Units into the skin at bedtime.    LINACLOTIDE (LINZESS) 145 MCG CAPS CAPSULE    Take 145 mcg by mouth daily before breakfast.   LISINOPRIL (PRINIVIL,ZESTRIL) 5 MG TABLET    Take 5 mg by mouth daily.   LORATADINE (CLARITIN) 10 MG TABLET    Take 10 mg by mouth daily.   LORAZEPAM (ATIVAN) 0.5 MG TABLET    Take 1/2 tablet by mouth every morning for anxiety (control); Take one tablet by mouth at bedtime for rest (control)   MAGNESIUM HYDROXIDE (MILK OF MAGNESIA) 800 MG/5ML SUSPENSION    Take 30 mLs by mouth daily as needed for constipation. 30cc   MENTHOL, TOPICAL ANALGESIC, (BIOFREEZE) 4 % GEL    Apply to right ankle and bilateral knees three times daily as needed.   MULTIPLE VITAMINS-MINERALS (PRESERVISION AREDS 2+MULTI VIT  PO)    Take 1 tablet by mouth 2 (two) times daily.   NITROFURANTOIN, MACROCRYSTAL-MONOHYDRATE, (MACROBID) 100 MG CAPSULE    Take 100 mg by mouth at bedtime.   OMEGA-3 ACID ETHYL ESTERS (LOVAZA) 1 G CAPSULE    Take 2 g by mouth 2 (two) times daily.   OMEPRAZOLE (PRILOSEC) 20 MG CAPSULE    Take 20 mg by mouth 2 (two) times daily with a meal.   POLYETHYLENE GLYCOL 3350 POWD    Take 17 g by mouth daily.   PROPYLENE GLYCOL (SYSTANE BALANCE) 0.6 % SOLN    Place 1 drop into both eyes 2 (two) times daily.   ROSUVASTATIN (CRESTOR) 20 MG TABLET    Take 20 mg by mouth daily.   SENNOSIDES-DOCUSATE SODIUM (SENOKOT-S) 8.6-50 MG TABLET    Take 2 tablets by mouth 2 (two) times daily.    SOLIFENACIN (VESICARE) 5 MG TABLET    Take 5 mg by mouth daily.   TRAMADOL (ULTRAM) 50 MG TABLET    Take 1 tablet (50 mg total) by mouth every 8 (eight) hours as needed for moderate pain.   TRAZODONE (DESYREL) 100 MG TABLET    Take 100 mg by mouth at bedtime.   VITAMIN C (ASCORBIC ACID) 500 MG TABLET    Take 500 mg by mouth 2 (two) times daily.    WHEAT DEXTRIN (BENEFIBER) POWD    Take 1 scoop by mouth daily. Mix with 8oz of fluid  Modified Medications   No medications on file  Discontinued Medications   BACLOFEN (LIORESAL) 5 MG TABS TABLET    Take 2.5 mg by mouth every 12 (twelve) hours as needed for muscle spasms.   NYSTATIN (MYCOSTATIN/NYSTOP) POWDER    Apply to groin three times daily as needed for rash, redness, or itching.   PROMETHAZINE (PHENERGAN) 25 MG TABLET    Take either by mouth, IM injection, or suppository every 6 hours PRN for nausea or vomiting.   TRAZODONE (DESYREL) 50 MG TABLET    Take 50 mg by  mouth at bedtime.      Physical Exam: Vitals:   05/07/16 1400  BP: (!) 146/64  Pulse: 91  Resp: 18  Temp: 98.8 F (37.1 C)  SpO2: 95%  Weight: 151 lb (68.5 kg)  Height: 5\' 3"  (1.6 m)    Physical Exam  Constitutional: She is oriented to person, place, and time. She appears well-developed and  well-nourished. No distress.  + Weight loss  HENT:  Head: Normocephalic and atraumatic.  Right Ear: External ear normal.  Left Ear: External ear normal.  Mouth/Throat: Oropharynx is clear and moist.  Eyes: Conjunctivae and EOM are normal. Pupils are equal, round, and reactive to light.  Neck: Normal range of motion. Neck supple. No JVD present.  Cardiovascular: Normal rate and regular rhythm.   Murmur heard. Pulmonary/Chest: Effort normal and breath sounds normal. She has no wheezes.  Abdominal: Soft. Bowel sounds are normal. She exhibits no distension. There is tenderness (generalized tenderness, worse on the right upper and lower quad). There is no rebound.  Rounded abdomen   Genitourinary:  Genitourinary Comments: Reports dysuria with functional/urge incontinence  Musculoskeletal: She exhibits no edema or tenderness.       Left hand: She exhibits normal range of motion and no tenderness. Normal sensation noted. Normal strength noted.  Right sided hemiparesis with mild upper extremity contracture. Uses wheelchair for ambulation. Grip and leg strength on left 5+/5+, 3+/3+ on right  Lymphadenopathy:    She has no cervical adenopathy.  Neurological: She is alert and oriented to person, place, and time.  Skin: Skin is warm and dry. She is not diaphoretic.  Psychiatric: She has a normal mood and affect.  Reports of anhedonia over the last 1 month    Labs reviewed: Basic Metabolic Panel:  Recent Labs  10/12/15 1028 10/12/15 1145 12/17/15 0732 01/21/16 03/27/16 04/18/16 1415  NA 128* 128* 135 140 132* 135*  K 4.9 4.7 4.3 4.7 4.8 4.8  CL 95* 94* 100*  --   --   --   CO2 24 24 26   --   --  22  GLUCOSE 463* 413* 175*  --   --  222*  BUN 26* 26* 28* 36* 41* 46.8*  CREATININE 1.52* 1.51* 1.58* 1.8* 1.7* 1.6*  CALCIUM 9.9 9.9 10.2  --   --  10.0   Liver Function Tests:  Recent Labs  06/06/15 0615 10/12/15 1028 03/27/16 04/18/16 1415  AST 20 18 12* 15  ALT 13* 23 11 13     ALKPHOS 50 71 87 78  BILITOT 0.4 0.6  --  0.27  PROT 6.6 6.8  --  7.3  ALBUMIN 2.9* 3.2*  --  3.0*   No results for input(s): LIPASE, AMYLASE in the last 8760 hours. No results for input(s): AMMONIA in the last 8760 hours. CBC:  Recent Labs  10/19/15 1408 12/17/15 0732 01/21/16 04/18/16 1415 04/29/16  WBC 8.5 7.2 7.3 7.7 9.9  NEUTROABS 6.3 4.3  --  5.1  --   HGB 10.1* 10.2* 10.3* 8.8* 8.8*  HCT 30.9* 32.8* 32* 27.1* 28*  MCV 93.2 94.0  --  88.8  --   PLT 280 323 239 258 301   TSH:  Recent Labs  04/29/16  TSH 0.27*   A1C: Lab Results  Component Value Date   HGBA1C 7.2 01/09/2016   Lipid Panel:  Recent Labs  01/09/16 03/04/16  CHOL 189 117  HDL 28* 35  LDLCALC 115 55  TRIG 229* 139    Assessment/Plan 1.  Chronic constipation Will increase miralax 17 gm to BID to see if this helps abdominal discomfort and constipation -to encourage pt to get oob  2. Right lower quadrant abdominal pain Will get KUB at this time, also will get cbc with diff, CMP, amylase and lipase -VS q shift and staff to notify for any changes    Endi Lagman K. Harle Battiest  [facility] [phone] 8 am - 5 pm) [phone] (after hours)

## 2016-05-08 DIAGNOSIS — G8111 Spastic hemiplegia affecting right dominant side: Secondary | ICD-10-CM | POA: Diagnosis not present

## 2016-05-08 DIAGNOSIS — D509 Iron deficiency anemia, unspecified: Secondary | ICD-10-CM | POA: Diagnosis not present

## 2016-05-08 DIAGNOSIS — D500 Iron deficiency anemia secondary to blood loss (chronic): Secondary | ICD-10-CM | POA: Diagnosis not present

## 2016-05-08 DIAGNOSIS — I789 Disease of capillaries, unspecified: Secondary | ICD-10-CM | POA: Diagnosis not present

## 2016-05-08 DIAGNOSIS — R1312 Dysphagia, oropharyngeal phase: Secondary | ICD-10-CM | POA: Diagnosis not present

## 2016-05-08 DIAGNOSIS — I69351 Hemiplegia and hemiparesis following cerebral infarction affecting right dominant side: Secondary | ICD-10-CM | POA: Diagnosis not present

## 2016-05-08 DIAGNOSIS — J439 Emphysema, unspecified: Secondary | ICD-10-CM | POA: Diagnosis not present

## 2016-05-08 LAB — HEPATIC FUNCTION PANEL
ALT: 18 U/L (ref 7–35)
AST: 17 U/L (ref 13–35)
Alkaline Phosphatase: 161 U/L — AB (ref 25–125)
BILIRUBIN, TOTAL: 0.3 mg/dL

## 2016-05-08 LAB — BASIC METABOLIC PANEL
BUN: 29 mg/dL — AB (ref 4–21)
Creatinine: 1.4 mg/dL — AB (ref 0.5–1.1)
GLUCOSE: 96 mg/dL
Potassium: 4.7 mmol/L (ref 3.4–5.3)
Sodium: 136 mmol/L — AB (ref 137–147)

## 2016-05-09 DIAGNOSIS — I69351 Hemiplegia and hemiparesis following cerebral infarction affecting right dominant side: Secondary | ICD-10-CM | POA: Diagnosis not present

## 2016-05-09 DIAGNOSIS — R1312 Dysphagia, oropharyngeal phase: Secondary | ICD-10-CM | POA: Diagnosis not present

## 2016-05-09 DIAGNOSIS — G8111 Spastic hemiplegia affecting right dominant side: Secondary | ICD-10-CM | POA: Diagnosis not present

## 2016-05-12 DIAGNOSIS — I69351 Hemiplegia and hemiparesis following cerebral infarction affecting right dominant side: Secondary | ICD-10-CM | POA: Diagnosis not present

## 2016-05-12 DIAGNOSIS — R1312 Dysphagia, oropharyngeal phase: Secondary | ICD-10-CM | POA: Diagnosis not present

## 2016-05-12 DIAGNOSIS — G8111 Spastic hemiplegia affecting right dominant side: Secondary | ICD-10-CM | POA: Diagnosis not present

## 2016-05-13 DIAGNOSIS — G8111 Spastic hemiplegia affecting right dominant side: Secondary | ICD-10-CM | POA: Diagnosis not present

## 2016-05-13 DIAGNOSIS — R1312 Dysphagia, oropharyngeal phase: Secondary | ICD-10-CM | POA: Diagnosis not present

## 2016-05-13 DIAGNOSIS — I69351 Hemiplegia and hemiparesis following cerebral infarction affecting right dominant side: Secondary | ICD-10-CM | POA: Diagnosis not present

## 2016-05-14 DIAGNOSIS — R1312 Dysphagia, oropharyngeal phase: Secondary | ICD-10-CM | POA: Diagnosis not present

## 2016-05-14 DIAGNOSIS — I69351 Hemiplegia and hemiparesis following cerebral infarction affecting right dominant side: Secondary | ICD-10-CM | POA: Diagnosis not present

## 2016-05-14 DIAGNOSIS — G8111 Spastic hemiplegia affecting right dominant side: Secondary | ICD-10-CM | POA: Diagnosis not present

## 2016-05-15 DIAGNOSIS — G8111 Spastic hemiplegia affecting right dominant side: Secondary | ICD-10-CM | POA: Diagnosis not present

## 2016-05-15 DIAGNOSIS — R1312 Dysphagia, oropharyngeal phase: Secondary | ICD-10-CM | POA: Diagnosis not present

## 2016-05-15 DIAGNOSIS — I69351 Hemiplegia and hemiparesis following cerebral infarction affecting right dominant side: Secondary | ICD-10-CM | POA: Diagnosis not present

## 2016-05-16 DIAGNOSIS — R1312 Dysphagia, oropharyngeal phase: Secondary | ICD-10-CM | POA: Diagnosis not present

## 2016-05-16 DIAGNOSIS — G8111 Spastic hemiplegia affecting right dominant side: Secondary | ICD-10-CM | POA: Diagnosis not present

## 2016-05-16 DIAGNOSIS — I69351 Hemiplegia and hemiparesis following cerebral infarction affecting right dominant side: Secondary | ICD-10-CM | POA: Diagnosis not present

## 2016-05-19 DIAGNOSIS — R1312 Dysphagia, oropharyngeal phase: Secondary | ICD-10-CM | POA: Diagnosis not present

## 2016-05-19 DIAGNOSIS — G8111 Spastic hemiplegia affecting right dominant side: Secondary | ICD-10-CM | POA: Diagnosis not present

## 2016-05-19 DIAGNOSIS — I69351 Hemiplegia and hemiparesis following cerebral infarction affecting right dominant side: Secondary | ICD-10-CM | POA: Diagnosis not present

## 2016-05-20 ENCOUNTER — Non-Acute Institutional Stay (SKILLED_NURSING_FACILITY): Payer: Medicare Other | Admitting: Internal Medicine

## 2016-05-20 ENCOUNTER — Encounter: Payer: Self-pay | Admitting: Internal Medicine

## 2016-05-20 DIAGNOSIS — E1149 Type 2 diabetes mellitus with other diabetic neurological complication: Secondary | ICD-10-CM | POA: Diagnosis not present

## 2016-05-20 DIAGNOSIS — R946 Abnormal results of thyroid function studies: Secondary | ICD-10-CM

## 2016-05-20 DIAGNOSIS — D500 Iron deficiency anemia secondary to blood loss (chronic): Secondary | ICD-10-CM | POA: Diagnosis not present

## 2016-05-20 DIAGNOSIS — I10 Essential (primary) hypertension: Secondary | ICD-10-CM | POA: Diagnosis not present

## 2016-05-20 NOTE — Progress Notes (Signed)
Facility Location: [facility]  Room Number: 104-A  Code Status: Full Code   This is a nursing facility follow up  of DM and need for diabetic footwear.  Interim medical record and care since last [place] visit was updated with review of diagnostic studies and change in clinical status since last visit were documented.  HPI: Evaluation has been requested to document need for with diabetic shoes. The patient does have insulin-dependent diabetes. She's on basal as well as pre-meal insulin. She states that she is carb sensitive but sugars are well controlled if she will avoid excess carb intake such as pasta. With good dietary compliance she states that her sugars run 90+ -140+. Her last A1c was 7.2% on 01/09/16. She denies any hypoglycemia. She does have some excessive thirst. She has numbness and tingling in her feet as well as her right hand. She has no new skin lesions. She was seen a podiatrist who comes to the facility. She is followed closely by her ophthalmologist and has had cataract surgery. She has actually gone online and ordered the shoes which are called Dr. [name] have special support insoles. She was recently evaluated for right abdominal pain in the context of chronic constipation. Extensive lab studies revealed stable normochromic, normocytic anemia. Creatinine improved 1.4. TSH was low at 0.27. She is not on thyroid replacement. I find no history of thyroid disease in the past medical history  She has been put on Linzess  Review of systems: She describes discomfort at her ankles as her feet  rotate outward 2 night resulting in pressure on the lateral aspects of the feet. This is worse on the right. She has used a brace on the right lower extremity  Constitutional: No fever,significant weight change, fatigue  Eyes: No redness, discharge, pain, vision change Cardiovascular: No chest pain, palpitations,paroxysmal  nocturnal dyspnea, claudication, edema  Respiratory: No cough, sputum production,hemoptysis, DOE , significant snoring,apnea  Gastrointestinal: No heartburn,dysphagia, nausea / vomiting,rectal bleeding, melena,change in bowels Genitourinary: No dysuria,hematuria, pyuria,  incontinence, nocturia Musculoskeletal: No joint stiffness, joint swelling, weakness,pain Dermatologic: No rash, pruritus, change in appearance of skin Neurologic: No dizziness,headache,syncope, seizures Endocrine: No change in hair/skin/ nails, excessive hunger, excessive urination  Hematologic/lymphatic: No significant bruising, lymphadenopathy,abnormal bleeding  Physical exam:  Pertinent or positive findings:Hair is fine). Speech is slurred. She has a grade 1.5 raspy systolic murmur. Abdomen is protuberant.   she has minimal range of motion of the right foot. There is atrophy of the calves particularly on the right. She has trace edema at the sock line. There is a small tattoo at the right lateral ankle. The left great toenail is absent. She has flexion contracture of the right upper extremity.   sensation to light touch is grossly intact over the feet except decreased over the left great toe.     General appearance:Adequately nourished; no acute distress , increased work of breathing is present.   Lymphatic: No lymphadenopathy about the head, neck, axilla . Eyes: No conjunctival inflammation or lid edema is present. There is no scleral icterus. Ears:  External ear exam shows no significant lesions or deformities.   Nose:  External nasal examination shows no deformity or inflammation. Nasal mucosa are pink and moist without lesions ,exudates Oral exam: lips and gums are healthy appearing.There is no oropharyngeal erythema or exudate . Neck:  No thyromegaly, masses, tenderness noted.    Heart:  Normal rate and regular rhythm. S1 and S2 normal  without gallop, murmur, click, rub .  Lungs:Chest clear to auscultation without  wheezes, rhonchi,rales , rubs. Abdomen:Bowel sounds are normal. Abdomen is soft and nontender with no organomegaly, hernias,masses. GU: deferred  Extremities:  No cyanosis, clubbing,edema  Neurologic exam : Cn 2-7 intact Strength equal  in upper & lower extremities Balance,Rhomberg,finger to nose testing could not be completed due to clinical state Deep tendon reflexes are equal Skin: Warm & dry w/o tenting. No significant lesions or rash.  See summary under each active problem in the Problem List with associated updated therapeutic plan

## 2016-05-20 NOTE — Assessment & Plan Note (Signed)
TSH Free T4 FreeT3

## 2016-05-20 NOTE — Assessment & Plan Note (Addendum)
A1c , urine microalbumin, BMET Complete form for diabetic shoes

## 2016-05-20 NOTE — Patient Instructions (Signed)
See assessment and plan under each diagnosis in the problem list and acutely for this visit 

## 2016-05-20 NOTE — Assessment & Plan Note (Signed)
Blood pressure is low, decrease hydralazine to 25 mg every 8 hours

## 2016-05-20 NOTE — Assessment & Plan Note (Signed)
CBC

## 2016-05-21 DIAGNOSIS — D500 Iron deficiency anemia secondary to blood loss (chronic): Secondary | ICD-10-CM | POA: Diagnosis not present

## 2016-05-21 DIAGNOSIS — D649 Anemia, unspecified: Secondary | ICD-10-CM | POA: Diagnosis not present

## 2016-05-21 DIAGNOSIS — E222 Syndrome of inappropriate secretion of antidiuretic hormone: Secondary | ICD-10-CM | POA: Diagnosis not present

## 2016-05-21 DIAGNOSIS — I69351 Hemiplegia and hemiparesis following cerebral infarction affecting right dominant side: Secondary | ICD-10-CM | POA: Diagnosis not present

## 2016-05-21 DIAGNOSIS — R1312 Dysphagia, oropharyngeal phase: Secondary | ICD-10-CM | POA: Diagnosis not present

## 2016-05-21 DIAGNOSIS — I10 Essential (primary) hypertension: Secondary | ICD-10-CM | POA: Diagnosis not present

## 2016-05-21 DIAGNOSIS — E1129 Type 2 diabetes mellitus with other diabetic kidney complication: Secondary | ICD-10-CM | POA: Diagnosis not present

## 2016-05-21 DIAGNOSIS — E119 Type 2 diabetes mellitus without complications: Secondary | ICD-10-CM | POA: Diagnosis not present

## 2016-05-21 DIAGNOSIS — G8111 Spastic hemiplegia affecting right dominant side: Secondary | ICD-10-CM | POA: Diagnosis not present

## 2016-05-21 DIAGNOSIS — D509 Iron deficiency anemia, unspecified: Secondary | ICD-10-CM | POA: Diagnosis not present

## 2016-05-21 DIAGNOSIS — E039 Hypothyroidism, unspecified: Secondary | ICD-10-CM | POA: Diagnosis not present

## 2016-05-21 LAB — TSH: TSH: 0.51 u[IU]/mL (ref 0.41–5.90)

## 2016-05-21 LAB — BASIC METABOLIC PANEL
BUN: 42 mg/dL — AB (ref 4–21)
CREATININE: 1.7 mg/dL — AB (ref 0.5–1.1)
GLUCOSE: 117 mg/dL
Potassium: 5.9 mmol/L — AB (ref 3.4–5.3)
SODIUM: 139 mmol/L (ref 137–147)

## 2016-05-21 LAB — HEMOGLOBIN A1C: Hemoglobin A1C: 5.3

## 2016-05-21 LAB — CBC AND DIFFERENTIAL
HCT: 30 % — AB (ref 36–46)
HEMOGLOBIN: 9.5 g/dL — AB (ref 12.0–16.0)
Platelets: 283 10*3/uL (ref 150–399)
WBC: 7 10*3/mL

## 2016-05-22 ENCOUNTER — Non-Acute Institutional Stay (SKILLED_NURSING_FACILITY): Payer: Medicare Other | Admitting: Internal Medicine

## 2016-05-22 ENCOUNTER — Encounter: Payer: Self-pay | Admitting: Internal Medicine

## 2016-05-22 ENCOUNTER — Other Ambulatory Visit (HOSPITAL_COMMUNITY): Payer: Self-pay | Admitting: Internal Medicine

## 2016-05-22 DIAGNOSIS — I10 Essential (primary) hypertension: Secondary | ICD-10-CM

## 2016-05-22 DIAGNOSIS — Z794 Long term (current) use of insulin: Secondary | ICD-10-CM

## 2016-05-22 DIAGNOSIS — E875 Hyperkalemia: Secondary | ICD-10-CM

## 2016-05-22 DIAGNOSIS — I69351 Hemiplegia and hemiparesis following cerebral infarction affecting right dominant side: Secondary | ICD-10-CM | POA: Diagnosis not present

## 2016-05-22 DIAGNOSIS — G8111 Spastic hemiplegia affecting right dominant side: Secondary | ICD-10-CM | POA: Diagnosis not present

## 2016-05-22 DIAGNOSIS — R946 Abnormal results of thyroid function studies: Secondary | ICD-10-CM | POA: Diagnosis not present

## 2016-05-22 DIAGNOSIS — N183 Chronic kidney disease, stage 3 (moderate): Secondary | ICD-10-CM

## 2016-05-22 DIAGNOSIS — R1319 Other dysphagia: Secondary | ICD-10-CM

## 2016-05-22 DIAGNOSIS — R1312 Dysphagia, oropharyngeal phase: Secondary | ICD-10-CM | POA: Diagnosis not present

## 2016-05-22 DIAGNOSIS — E1122 Type 2 diabetes mellitus with diabetic chronic kidney disease: Secondary | ICD-10-CM

## 2016-05-22 NOTE — Progress Notes (Signed)
This is a nursing facility follow up for specific issue of abnormal labs.  Interim medical record and care since last [place] visit was updated with review of diagnostic studies and change in clinical status since last visit were documented.  HPI: The patient was seen 05/20/16 for evaluation of her diabetes. She states that she had been following a more rigid carbohydrate restriction. Her last A1c was 01/09/16 with a value of 7.2%. The repeat A1c 3/21 was 5.3%, nondiabetic.  Blood sugars range from 55 on 05/13/16 to 514 on 05/12/16 with most blood sugars ranging in the 100s.  She denies changes to her diet, but states she skips breakfast if she does not like the food.   Also her TSH on 04/29/16 was 0.27. Repeat TSH was 0.51 with normal free T4 of 1.28. Free T3 is pending. She has had a chronic anemia ;the hemoglobin was 8.8 &  hematocrit 28 on 04/29/16.On 3/21 values had improved with hemoglobin  9.5 & hematocrit 29.7.  Review of systems:  Reports occasionally feeling "swimmy headed" Reports occasional night sweats.  Reports urinary burning and states that nurses took urine sample yesterday. Reports dysphagia and coughing with fluids, vomited liquids yesterday during breakfast.  Being assessed by SLP.  Reports xerostomia.  Linzess has helped her chronic constipation.  Denies hematuria, melena, rectal bleeding, epistaxis, difficulty stopping bleeding, or easy bruising.  Denies heat intolerance, palpitations, anxiety, or tremors  Constitutional: No fever,significant weight change, fatigue  Eyes: No redness, discharge, pain, vision change ENT/mouth: No nasal congestion,  purulent discharge, earache,change in hearing ,sore throat  Cardiovascular: No chest pain, palpitations,paroxysmal nocturnal dyspnea, claudication, edema  Respiratory: No sputum production,hemoptysis, DOE , significant snoring,apnea  Gastrointestinal: No heartburn, abdominal pain Genitourinary: No  dysuria,hematuria, pyuria,  incontinence, nocturia Musculoskeletal: No joint stiffness, joint swelling,pain Dermatologic: No rash, pruritus, change in appearance of skin Neurologic: No headache,syncope, seizures, numbness , tingling Psychiatric: No significant depression, insomnia, anorexia Endocrine: No change in hair/skin/ nails, excessive thirst, excessive hunger, excessive urination  Hematologic/lymphatic: No lymphadenopathy Allergy/immunology: No itchy/ watery eyes, significant sneezing, urticaria, angioedema  Physical exam:  Pertinent or positive findings: Arcus senilis  Clubbing of fingernails Mild dysarthria and xerostomia Poor dentition No movement of right arm, able to move right thumb slightly. Flexion contractures of right hand.    Decreased movement of right leg, able to lift but not extend.  Foot drop of right foot.  L great toenail absent.  Grade 2 systolic murmur.  Lung sounds decreased in bases.    General appearance:Adequately nourished; no acute distress , increased work of breathing is present.   Lymphatic: No lymphadenopathy about the head, neck, axilla . Eyes: No conjunctival inflammation or lid edema is present. There is no scleral icterus. Ears:  External ear exam shows no significant lesions or deformities.   Nose:  External nasal examination shows no deformity or inflammation. Nasal mucosa are pink and moist without lesions ,exudates Oral exam: lips and gums are healthy appearing.There is no oropharyngeal erythema or exudate . Neck:  No thyromegaly, masses, tenderness noted.    Heart:  Normal rate and regular rhythm. S1 and S2 normal without gallop, click, rub .  Lungs:Chest without wheezes, rhonchi,rales , rubs. Abdomen:Bowel sounds are normal. Abdomen is soft and nontender with no organomegaly, hernias,masses. GU: deferred  Extremities:  No cyanosis, edema  Neurologic exam : Cn 2-7 intact Balance,Rhomberg,finger to nose testing could not be completed  due to clinical state Skin: Warm & dry w/o tenting.  No significant lesions or rash.  See summary under each active problem in the Problem List with associated updated therapeutic plan

## 2016-05-22 NOTE — Patient Instructions (Signed)
See assessment and plan under each diagnosis in the problem list and acutely for this visit 

## 2016-05-22 NOTE — Assessment & Plan Note (Signed)
Blood pressures have been low Hydralazine will be discontinued if BP remains low

## 2016-05-22 NOTE — Assessment & Plan Note (Addendum)
05/21/16 creatinine 1.7 and 5.9  Chelsey Gallagher A1c 5.3%, glucoses typically in the 100s  occasional night sweats, rule out nocturnal hypoglycemia Decrease lisinopril to 2.5 mg daily Decrease basal insulin to 24 units BMET 6 weeks

## 2016-05-22 NOTE — Progress Notes (Signed)
   [facility] Room: 104  This is a nursing facility follow up for specific acute issue of  of chronic medical diagnoses  [place] readmission within 30 days  Interim medical record and care since last [place] visit was updated with review of diagnostic studies and change in clinical status since last visit were documented.  HPI:  Review of systems: Dementia invalidated responses. Date given as   Constitutional: No fever,significant weight change, fatigue  Eyes: No redness, discharge, pain, vision change ENT/mouth: No nasal congestion,  purulent discharge, earache,change in hearing ,sore throat  Cardiovascular: No chest pain, palpitations,paroxysmal nocturnal dyspnea, claudication, edema  Respiratory: No cough, sputum production,hemoptysis, DOE , significant snoring,apnea  Gastrointestinal: No heartburn,dysphagia,abdominal pain, nausea / vomiting,rectal bleeding, melena,change in bowels Genitourinary: No dysuria,hematuria, pyuria,  incontinence, nocturia Musculoskeletal: No joint stiffness, joint swelling, weakness,pain Dermatologic: No rash, pruritus, change in appearance of skin Neurologic: No dizziness,headache,syncope, seizures, numbness , tingling Psychiatric: No significant anxiety , depression, insomnia, anorexia Endocrine: No change in hair/skin/ nails, excessive thirst, excessive hunger, excessive urination  Hematologic/lymphatic: No significant bruising, lymphadenopathy,abnormal bleeding Allergy/immunology: No itchy/ watery eyes, significant sneezing, urticaria, angioedema  Physical exam:  Pertinent or positive findings: General appearance:Adequately nourished; no acute distress , increased work of breathing is present.   Lymphatic: No lymphadenopathy about the head, neck, axilla . Eyes: No conjunctival inflammation or lid edema is present. There is no scleral icterus. Ears:  External ear exam shows no significant lesions or  deformities.   Nose:  External nasal examination shows no deformity or inflammation. Nasal mucosa are pink and moist without lesions ,exudates Oral exam: lips and gums are healthy appearing.There is no oropharyngeal erythema or exudate . Neck:  No thyromegaly, masses, tenderness noted.    Heart:  Normal rate and regular rhythm. S1 and S2 normal without gallop, murmur, click, rub .  Lungs:Chest clear to auscultation without wheezes, rhonchi,rales , rubs. Abdomen:Bowel sounds are normal. Abdomen is soft and nontender with no organomegaly, hernias,masses. GU: deferred  Extremities:  No cyanosis, clubbing,edema  Neurologic exam : Cn 2-7 intact Strength equal  in upper & lower extremities Balance,Rhomberg,finger to nose testing could not be completed due to clinical state Deep tendon reflexes are equal Skin: Warm & dry w/o tenting. No significant lesions or rash.  See summary under each active problem in the Problem List with associated updated therapeutic plan

## 2016-05-22 NOTE — Assessment & Plan Note (Signed)
Recheck TSH 6 weeks

## 2016-05-23 DIAGNOSIS — G8111 Spastic hemiplegia affecting right dominant side: Secondary | ICD-10-CM | POA: Diagnosis not present

## 2016-05-23 DIAGNOSIS — I69351 Hemiplegia and hemiparesis following cerebral infarction affecting right dominant side: Secondary | ICD-10-CM | POA: Diagnosis not present

## 2016-05-23 DIAGNOSIS — R1312 Dysphagia, oropharyngeal phase: Secondary | ICD-10-CM | POA: Diagnosis not present

## 2016-05-25 DIAGNOSIS — G8111 Spastic hemiplegia affecting right dominant side: Secondary | ICD-10-CM | POA: Diagnosis not present

## 2016-05-25 DIAGNOSIS — I69351 Hemiplegia and hemiparesis following cerebral infarction affecting right dominant side: Secondary | ICD-10-CM | POA: Diagnosis not present

## 2016-05-25 DIAGNOSIS — R1312 Dysphagia, oropharyngeal phase: Secondary | ICD-10-CM | POA: Diagnosis not present

## 2016-05-26 DIAGNOSIS — R1312 Dysphagia, oropharyngeal phase: Secondary | ICD-10-CM | POA: Diagnosis not present

## 2016-05-26 DIAGNOSIS — G8111 Spastic hemiplegia affecting right dominant side: Secondary | ICD-10-CM | POA: Diagnosis not present

## 2016-05-26 DIAGNOSIS — I69351 Hemiplegia and hemiparesis following cerebral infarction affecting right dominant side: Secondary | ICD-10-CM | POA: Diagnosis not present

## 2016-05-28 ENCOUNTER — Ambulatory Visit (HOSPITAL_COMMUNITY)
Admission: RE | Admit: 2016-05-28 | Discharge: 2016-05-28 | Disposition: A | Discharge status: Home / Self Care | Payer: Medicare Other | Source: Ambulatory Visit | Attending: Internal Medicine | Admitting: Internal Medicine

## 2016-05-28 ENCOUNTER — Ambulatory Visit (HOSPITAL_COMMUNITY)
Admission: RE | Admit: 2016-05-28 | Discharge: 2016-05-28 | Disposition: A | Discharge status: Home / Self Care | Payer: PRIVATE HEALTH INSURANCE | Source: Ambulatory Visit | Attending: Internal Medicine | Admitting: Internal Medicine

## 2016-05-28 DIAGNOSIS — R1319 Other dysphagia: Secondary | ICD-10-CM | POA: Diagnosis not present

## 2016-05-28 DIAGNOSIS — R1310 Dysphagia, unspecified: Secondary | ICD-10-CM | POA: Diagnosis not present

## 2016-05-28 DIAGNOSIS — G8111 Spastic hemiplegia affecting right dominant side: Secondary | ICD-10-CM | POA: Diagnosis not present

## 2016-05-28 DIAGNOSIS — R1312 Dysphagia, oropharyngeal phase: Secondary | ICD-10-CM | POA: Diagnosis not present

## 2016-05-28 DIAGNOSIS — R05 Cough: Secondary | ICD-10-CM | POA: Diagnosis not present

## 2016-05-28 DIAGNOSIS — I69351 Hemiplegia and hemiparesis following cerebral infarction affecting right dominant side: Secondary | ICD-10-CM | POA: Diagnosis not present

## 2016-05-29 DIAGNOSIS — R1312 Dysphagia, oropharyngeal phase: Secondary | ICD-10-CM | POA: Diagnosis not present

## 2016-05-29 DIAGNOSIS — I69351 Hemiplegia and hemiparesis following cerebral infarction affecting right dominant side: Secondary | ICD-10-CM | POA: Diagnosis not present

## 2016-05-29 DIAGNOSIS — G8111 Spastic hemiplegia affecting right dominant side: Secondary | ICD-10-CM | POA: Diagnosis not present

## 2016-05-30 ENCOUNTER — Encounter: Payer: Self-pay | Admitting: Internal Medicine

## 2016-05-30 DIAGNOSIS — I69351 Hemiplegia and hemiparesis following cerebral infarction affecting right dominant side: Secondary | ICD-10-CM | POA: Diagnosis not present

## 2016-05-30 DIAGNOSIS — R1312 Dysphagia, oropharyngeal phase: Secondary | ICD-10-CM | POA: Diagnosis not present

## 2016-05-30 DIAGNOSIS — R1310 Dysphagia, unspecified: Secondary | ICD-10-CM | POA: Insufficient documentation

## 2016-05-30 DIAGNOSIS — G8111 Spastic hemiplegia affecting right dominant side: Secondary | ICD-10-CM | POA: Diagnosis not present

## 2016-05-31 DIAGNOSIS — I69351 Hemiplegia and hemiparesis following cerebral infarction affecting right dominant side: Secondary | ICD-10-CM | POA: Diagnosis not present

## 2016-05-31 DIAGNOSIS — R1312 Dysphagia, oropharyngeal phase: Secondary | ICD-10-CM | POA: Diagnosis not present

## 2016-05-31 DIAGNOSIS — G8111 Spastic hemiplegia affecting right dominant side: Secondary | ICD-10-CM | POA: Diagnosis not present

## 2016-06-02 DIAGNOSIS — I69351 Hemiplegia and hemiparesis following cerebral infarction affecting right dominant side: Secondary | ICD-10-CM | POA: Diagnosis not present

## 2016-06-02 DIAGNOSIS — R1312 Dysphagia, oropharyngeal phase: Secondary | ICD-10-CM | POA: Diagnosis not present

## 2016-06-03 DIAGNOSIS — I69351 Hemiplegia and hemiparesis following cerebral infarction affecting right dominant side: Secondary | ICD-10-CM | POA: Diagnosis not present

## 2016-06-03 DIAGNOSIS — R1312 Dysphagia, oropharyngeal phase: Secondary | ICD-10-CM | POA: Diagnosis not present

## 2016-06-04 DIAGNOSIS — I69351 Hemiplegia and hemiparesis following cerebral infarction affecting right dominant side: Secondary | ICD-10-CM | POA: Diagnosis not present

## 2016-06-04 DIAGNOSIS — R1312 Dysphagia, oropharyngeal phase: Secondary | ICD-10-CM | POA: Diagnosis not present

## 2016-06-05 DIAGNOSIS — R1312 Dysphagia, oropharyngeal phase: Secondary | ICD-10-CM | POA: Diagnosis not present

## 2016-06-05 DIAGNOSIS — I69351 Hemiplegia and hemiparesis following cerebral infarction affecting right dominant side: Secondary | ICD-10-CM | POA: Diagnosis not present

## 2016-06-06 ENCOUNTER — Non-Acute Institutional Stay (SKILLED_NURSING_FACILITY): Payer: Medicare Other | Admitting: Nurse Practitioner

## 2016-06-06 ENCOUNTER — Encounter: Payer: Self-pay | Admitting: Nurse Practitioner

## 2016-06-06 DIAGNOSIS — H9202 Otalgia, left ear: Secondary | ICD-10-CM

## 2016-06-06 DIAGNOSIS — R1312 Dysphagia, oropharyngeal phase: Secondary | ICD-10-CM | POA: Diagnosis not present

## 2016-06-06 DIAGNOSIS — I69351 Hemiplegia and hemiparesis following cerebral infarction affecting right dominant side: Secondary | ICD-10-CM | POA: Diagnosis not present

## 2016-06-06 NOTE — Progress Notes (Signed)
Patient ID: Chelsey Gallagher, female   DOB: 12/14/1950, 66 y.o.   MRN: [id number]    Nursing Home Location:  [facility]  Place of Service: SNF (31)  PCP: [name], MD  Allergies  Allergen Reactions  . Flexeril [Cyclobenzaprine Hcl] Anaphylaxis  . Lyrica [Pregabalin] Other (See Comments)    "wires my up"  . Codeine Nausea And Vomiting    Chief Complaint  Patient presents with  . Acute Visit    Resident is being seen due to a left earache.    HPI:  Patient is a 66 y.o. female seen today at [facility] for ear pain. Pt with a hx of CVA with spastic hemiplegia, constipation, anxiety, DM, HTN, and hyperlipidemia. Pt reports when wax builds up in her ear she will have increase pain and was wanting this checked. Reports she has used sweet oil in the past. No drainage from ear. No fevers or chills.   Review of Systems:  Review of Systems  Constitutional: Negative for activity change and appetite change.  HENT: Positive for ear pain, hearing loss, postnasal drip and sore throat. Negative for congestion, ear discharge, facial swelling and mouth sores.   Respiratory: Negative for shortness of breath and wheezing.     Past Medical History:  Diagnosis Date  . Anxiety   . Aortic stenosis    moderate AS 2014 echo  . Arthritis   . Cataract cortical, senile, left    Dr Gevena Cotton  . Chronic GI bleeding    presumed/notes 06/06/2015  . Chronic pain   . Complication of anesthesia    doesn't wake up good- "usually end up in ICU" also experiences post op nausea and vomiting  . Constipation   . Contracture of elbow   . Contracture of right wrist   . Depression   . Diabetes mellitus   . Diabetic neuropathy (HCC)    Diabetic neuropathy- has inproved since she quit smoking.  . Diabetic neuropathy ([place])   . Difficulty in walking   . Dysphagia, oropharyngeal phase   . Emphysema lung ([place])   . Facial weakness   . Foot drop, left   . Frequent UTI   . GERD  (gastroesophageal reflux disease)   . H/O hiatal hernia    second one  . Hearing loss   . Heart murmur    PCP- [facility]- No tx needed.  . Hemiplegia affecting right dominant side ([place])   . History of blood transfusion 06/2015  . History of kidney stones   . Hyperlipidemia 05/14/2011  . Hypertension   . Iron deficiency anemia   . Lung abnormality 05/11/2011  . Occlusion and stenosis of carotid artery   . Orthostatic hypotension    after surgery.  . Overactive bladder   . PAD (peripheral artery disease) ([place])    S/p bypass grafting, unclear exactly where  . Pneumonia    hx of  . Restless leg syndrome   . Stroke ([place]) 05/04/2011   Weakness on Right. Wears leg brace.  . Urinary incontinence    Past Surgical History:  Procedure Laterality Date  . ABDOMINAL HYSTERECTOMY  2003  . APPENDECTOMY  2011  . BYPASS GRAFT  2003   Abdominal aortic  . CAROTID STENT INSERTION Right 2013   ICA/notes 06/06/2015  . CESAREAN SECTION  1984  . CHOLECYSTECTOMY  1984  . COLONOSCOPY N/A 05/10/2013   Procedure: COLONOSCOPY;  Surgeon: Lafayette Dragon, MD;  Location: [facility] ENDOSCOPY;  Service: Endoscopy;  Laterality:  N/A;  . ESOPHAGOGASTRODUODENOSCOPY N/A 05/09/2013   Procedure: ESOPHAGOGASTRODUODENOSCOPY (EGD);  Surgeon: Lafayette Dragon, MD;  Location: [facility] ENDOSCOPY;  Service: Endoscopy;  Laterality: N/A;  . HERNIA REPAIR  2011  . IR GENERIC HISTORICAL  12/17/2015   IR ANGIO INTRA EXTRACRAN SEL COM CAROTID INNOMINATE BILAT MOD SED 12/17/2015 [facility]-INTERV RAD  . IR GENERIC HISTORICAL  12/17/2015   IR ANGIO VERTEBRAL SEL VERTEBRAL UNI R MOD SED 12/17/2015 [facility]-INTERV RAD  . JOINT REPLACEMENT Right   . [place]  . LITHOTRIPSY    . RADIOLOGY WITH ANESTHESIA N/A 12/17/2015   Procedure: RADIOLOGY WITH ANESTHESIA STENT PLACMENT;  Surgeon: Medication Radiologist, MD;  Location: [facility];  Service: Radiology;  Laterality: N/A;  . TOTAL HIP ARTHROPLASTY  2008   Right   Social History:   reports  that she has quit smoking. Her smoking use included Cigarettes. She smoked 0.00 packs per day for 45.00 years. She quit smokeless tobacco use about 5 years ago. She reports that she does not drink alcohol or use drugs.  Family History  Problem Relation Age of Onset  . Cancer Maternal Aunt     breast    Medications: Patient's Medications  New Prescriptions   No medications on file  Previous Medications   ACETAMINOPHEN (TYLENOL) 325 MG TABLET    Take 650 mg by mouth every 6 (six) hours as needed for pain.   ALBUTEROL (PROVENTIL) (2.5 MG/3ML) 0.083% NEBULIZER SOLUTION    Take 3 mLs (2.5 mg total) by nebulization every 4 (four) hours as needed for wheezing or shortness of breath.   AMINO ACIDS-PROTEIN HYDROLYS (FEEDING SUPPLEMENT, PRO-STAT SUGAR FREE 64,) LIQD    Take 30 mLs by mouth daily. At 1700   AMLODIPINE (NORVASC) 5 MG TABLET    Take 1 tablet (5 mg total) by mouth daily.   ASPIRIN 81 MG CHEWABLE TABLET    Chew 81 mg by mouth daily.   BISACODYL (DULCOLAX) 10 MG SUPPOSITORY    Place 10 mg rectally daily as needed for moderate constipation.   CHOLECALCIFEROL (VITAMIN D) 1000 UNITS TABLET    Take 1,000 Units by mouth daily.   CLOPIDOGREL (PLAVIX) 75 MG TABLET    Take 75 mg by mouth at bedtime.   DULOXETINE (CYMBALTA) 60 MG CAPSULE    Take 60 mg by mouth daily.   ESTRADIOL (ESTRACE) 0.1 MG/GM VAGINAL CREAM    Place 1 Applicatorful vaginally at bedtime.   FENOFIBRATE (TRICOR) 48 MG TABLET    Take 48 mg by mouth daily.   GABAPENTIN (NEURONTIN) 100 MG CAPSULE    Take 1 capsule (100 mg total) by mouth at bedtime.   HYDRALAZINE (APRESOLINE) 25 MG TABLET    Take 25 mg by mouth 3 (three) times daily.   INSULIN ASPART (NOVOLOG FLEXPEN) 100 UNIT/ML FLEXPEN    Give 10 units SQ with meals. Give an additional 5 units for CBG over 150 with meals.   INSULIN DEGLUDEC (TRESIBA FLEXTOUCH) 100 UNIT/ML SOPN FLEXTOUCH PEN    Inject 24 Units into the skin at bedtime.    LINACLOTIDE (LINZESS) 145 MCG CAPS  CAPSULE    Take 145 mcg by mouth daily before breakfast.   LISINOPRIL (PRINIVIL,ZESTRIL) 2.5 MG TABLET    Take 2.5 mg by mouth daily.   LORATADINE (CLARITIN) 10 MG TABLET    Take 10 mg by mouth daily.   LORAZEPAM (ATIVAN) 0.5 MG TABLET    Take 0.25 mg by mouth daily as needed for anxiety.  MAGNESIUM HYDROXIDE (MILK OF MAGNESIA) 800 MG/5ML SUSPENSION    Take 30 mLs by mouth 2 (two) times daily. 30cc   MENTHOL, TOPICAL ANALGESIC, (BIOFREEZE) 4 % GEL    Apply to right ankle and bilateral knees three times daily as needed.   MULTIPLE VITAMIN (MULTIVITAMIN) TABLET    Take 1 tablet by mouth daily.   MULTIPLE VITAMINS-MINERALS (PRESERVISION AREDS 2+MULTI VIT PO)    Take 1 tablet by mouth 2 (two) times daily.   NITROFURANTOIN, MACROCRYSTAL-MONOHYDRATE, (MACROBID) 100 MG CAPSULE    Take 100 mg by mouth at bedtime.   OMEGA-3 ACID ETHYL ESTERS (LOVAZA) 1 G CAPSULE    Take 2 g by mouth 2 (two) times daily.   OMEPRAZOLE (PRILOSEC) 20 MG CAPSULE    Take 20 mg by mouth daily before breakfast.   POLYETHYLENE GLYCOL 3350 POWD    Take 17 g by mouth 2 (two) times daily.    PROPYLENE GLYCOL (SYSTANE BALANCE) 0.6 % SOLN    Place 1 drop into both eyes 2 (two) times daily.   ROSUVASTATIN (CRESTOR) 20 MG TABLET    Take 20 mg by mouth daily.   SENNOSIDES-DOCUSATE SODIUM (SENOKOT-S) 8.6-50 MG TABLET    Take 2 tablets by mouth 2 (two) times daily.    SOLIFENACIN (VESICARE) 10 MG TABLET    Take 10 mg by mouth daily.   TRAMADOL (ULTRAM) 50 MG TABLET    Take 1 tablet (50 mg total) by mouth every 8 (eight) hours as needed for moderate pain.   TRAZODONE (DESYREL) 100 MG TABLET    Take 100 mg by mouth at bedtime.   VITAMIN C (ASCORBIC ACID) 500 MG TABLET    Take 500 mg by mouth 2 (two) times daily.    WHEAT DEXTRIN (BENEFIBER) POWD    Take 1 scoop by mouth daily. Mix with 8oz of fluid  Modified Medications   No medications on file  Discontinued Medications   LISINOPRIL (PRINIVIL,ZESTRIL) 5 MG TABLET    Take 5 mg by mouth  daily.     Physical Exam: Vitals:   06/06/16 1136  BP: (!) 125/58  Pulse: 83  Resp: 20  Temp: (!) 96.9 F (36.1 C)  SpO2: 91%    Physical Exam  Constitutional: She is oriented to person, place, and time. She appears well-developed and well-nourished. No distress.  HENT:  Head: Normocephalic and atraumatic.  Right Ear: External ear normal.  Left Ear: Tympanic membrane, external ear and ear canal normal. No drainage, swelling or tenderness.  No middle ear effusion. No decreased hearing is noted.  Nose: Nose normal.  Mouth/Throat: Oropharynx is clear and moist. No oropharyngeal exudate.  No wax noted to left ear, canal clear, no drainage  Eyes: Conjunctivae and EOM are normal. Pupils are equal, round, and reactive to light.  Neck: Normal range of motion. Neck supple. No JVD present. No tracheal deviation present. No thyromegaly present.  Cardiovascular: Normal rate and regular rhythm.   Murmur heard. Pulmonary/Chest: Effort normal and breath sounds normal. She has no wheezes.  Abdominal: Soft. Bowel sounds are normal.  Musculoskeletal:       Left hand: She exhibits normal range of motion and no tenderness. Normal sensation noted. Normal strength noted.  Right sided hemiparesis with mild upper extremity contracture. Uses wheelchair for ambulation.  Lymphadenopathy:    She has no cervical adenopathy.  Neurological: She is alert and oriented to person, place, and time.  Skin: Skin is warm and dry. She is not diaphoretic.  Psychiatric:  She has a normal mood and affect.    Labs reviewed: Basic Metabolic Panel:  Recent Labs  10/12/15 1028 10/12/15 1145 12/17/15 0732  04/18/16 1415 05/08/16 05/21/16  NA 128* 128* 135  < > 135* 136* 139  K 4.9 4.7 4.3  < > 4.8 4.7 5.9*  CL 95* 94* 100*  --   --   --   --   CO2 24 24 26   --  22  --   --   GLUCOSE 463* 413* 175*  --  222*  --   --   BUN 26* 26* 28*  < > 46.8* 29* 42*  CREATININE 1.52* 1.51* 1.58*  < > 1.6* 1.4* 1.7*    CALCIUM 9.9 9.9 10.2  --  10.0  --   --   < > = values in this interval not displayed. Liver Function Tests:  Recent Labs  10/12/15 1028 03/27/16 04/18/16 1415 05/08/16  AST 18 12* 15 17  ALT 23 11 13 18   ALKPHOS 71 87 78 161*  BILITOT 0.6  --  0.27  --   PROT 6.8  --  7.3  --   ALBUMIN 3.2*  --  3.0*  --    No results for input(s): LIPASE, AMYLASE in the last 8760 hours. No results for input(s): AMMONIA in the last 8760 hours. CBC:  Recent Labs  10/19/15 1408 12/17/15 0732  04/18/16 1415 04/29/16 05/21/16  WBC 8.5 7.2  < > 7.7 9.9 7.0  NEUTROABS 6.3 4.3  --  5.1  --   --   HGB 10.1* 10.2*  < > 8.8* 8.8* 9.5*  HCT 30.9* 32.8*  < > 27.1* 28* 30*  MCV 93.2 94.0  --  88.8  --   --   PLT 280 323  < > 258 301 283  < > = values in this interval not displayed. TSH:  Recent Labs  04/29/16 05/21/16  TSH 0.27* 0.51   A1C: Lab Results  Component Value Date   HGBA1C 5.3 05/21/2016   Lipid Panel:  Recent Labs  01/09/16 03/04/16  CHOL 189 117  HDL 28* 35  LDLCALC 115 55  TRIG 229* 139    Assessment/Plan 1. Left ear pain No signs of infection, wax build up at this time -to give tylenol PRN pain and monitor    Amika Tassin K. Harle Battiest  [facility] [phone] 8 am - 5 pm) [phone] (after hours)

## 2016-06-09 DIAGNOSIS — R1312 Dysphagia, oropharyngeal phase: Secondary | ICD-10-CM | POA: Diagnosis not present

## 2016-06-09 DIAGNOSIS — I69351 Hemiplegia and hemiparesis following cerebral infarction affecting right dominant side: Secondary | ICD-10-CM | POA: Diagnosis not present

## 2016-06-20 ENCOUNTER — Encounter: Payer: Self-pay | Admitting: Nurse Practitioner

## 2016-06-20 ENCOUNTER — Other Ambulatory Visit: Payer: Self-pay

## 2016-06-20 ENCOUNTER — Non-Acute Institutional Stay (SKILLED_NURSING_FACILITY): Payer: Medicare Other | Admitting: Nurse Practitioner

## 2016-06-20 DIAGNOSIS — R946 Abnormal results of thyroid function studies: Secondary | ICD-10-CM | POA: Diagnosis not present

## 2016-06-20 DIAGNOSIS — K5909 Other constipation: Secondary | ICD-10-CM | POA: Diagnosis not present

## 2016-06-20 DIAGNOSIS — E1122 Type 2 diabetes mellitus with diabetic chronic kidney disease: Secondary | ICD-10-CM

## 2016-06-20 DIAGNOSIS — F331 Major depressive disorder, recurrent, moderate: Secondary | ICD-10-CM

## 2016-06-20 DIAGNOSIS — R634 Abnormal weight loss: Secondary | ICD-10-CM

## 2016-06-20 DIAGNOSIS — Z794 Long term (current) use of insulin: Secondary | ICD-10-CM | POA: Diagnosis not present

## 2016-06-20 DIAGNOSIS — D500 Iron deficiency anemia secondary to blood loss (chronic): Secondary | ICD-10-CM | POA: Diagnosis not present

## 2016-06-20 DIAGNOSIS — N183 Chronic kidney disease, stage 3 unspecified: Secondary | ICD-10-CM

## 2016-06-20 MED ORDER — TRAMADOL HCL 50 MG PO TABS: 50.0000 mg | ORAL_TABLET | Freq: Three times a day (TID) | ORAL | 5 refills | Status: DC | PRN

## 2016-06-20 NOTE — Telephone Encounter (Signed)
Faxed to [facility] Fax Number: [phone], Phone Number [phone]  

## 2016-06-20 NOTE — Progress Notes (Signed)
Patient ID: Chelsey Gallagher, female   DOB: Feb 16, 1951, 66 y.o.   MRN: [id number]    Nursing Home Location:  [facility]  Place of Service: SNF (31)  PCP: [name], MD  Allergies  Allergen Reactions  . Flexeril [Cyclobenzaprine Hcl] Anaphylaxis  . Lyrica [Pregabalin] Other (See Comments)    "wires my up"  . Codeine Nausea And Vomiting    Chief Complaint  Patient presents with  . Medical Management of Chronic Issues    Routine Visit    HPI:  Patient is a 66 y.o. female seen today at [facility] for follow up on chronic conditions. Pt with a hx of CVA with spastic hemiplegia, constipation, anxiety, DM, HTN, and hyperlipidemia. Pt was recently seen by provider for acute ear symptoms which have now resolved. Pt states that her constipation is now resolved to the point that she is having soft stools daily. She had been taking Mieralax twice per day, however due to the loose stools, she has not taken this for the past week. No further complaints as of today. She is in good spirits this morning as she was able to get out of the facility yesterday to eat breakfast and go shopping. Pt reports appetite, sleep and urination have been adequate. Weight today was 151lb which is stable for her at this time. Denies CP, palpitations, fevers, chills, nausea or vomiting.   Review of Systems:  Review of Systems  Constitutional: Positive for activity change, appetite change, fatigue and unexpected weight change. Negative for chills.  HENT: Negative for congestion, hearing loss, sinus pressure and sore throat.   Eyes: Negative for visual disturbance.  Respiratory: Negative for apnea, cough, chest tightness, shortness of breath and wheezing.   Cardiovascular: Negative for chest pain, palpitations and leg swelling.  Gastrointestinal: Negative for abdominal distention, abdominal pain, blood in stool, constipation, diarrhea, nausea and vomiting.  Genitourinary: Positive for frequency  (vesicare helps). Negative for difficulty urinating, dysuria, hematuria, pelvic pain and vaginal pain.  Musculoskeletal: Negative for arthralgias and myalgias.  Skin: Negative for color change and wound.  Neurological: Positive for weakness. Negative for dizziness, syncope, facial asymmetry, speech difficulty, light-headedness and headaches. Numbness: right side.  Psychiatric/Behavioral: Negative for behavioral problems and confusion. The patient is not nervous/anxious.     Past Medical History:  Diagnosis Date  . Anxiety   . Aortic stenosis    moderate AS 2014 echo  . Arthritis   . Cataract cortical, senile, left    Dr Gevena Cotton  . Chronic GI bleeding    presumed/notes 06/06/2015  . Chronic pain   . Complication of anesthesia    doesn't wake up good- "usually end up in ICU" also experiences post op nausea and vomiting  . Constipation   . Contracture of elbow   . Contracture of right wrist   . Depression   . Diabetes mellitus   . Diabetic neuropathy (HCC)    Diabetic neuropathy- has inproved since she quit smoking.  . Diabetic neuropathy ([place])   . Difficulty in walking   . Dysphagia, oropharyngeal phase   . Emphysema lung ([place])   . Facial weakness   . Foot drop, left   . Frequent UTI   . GERD (gastroesophageal reflux disease)   . H/O hiatal hernia    second one  . Hearing loss   . Heart murmur    PCP- [facility]- No tx needed.  . Hemiplegia affecting right dominant side ([place])   . History of blood  transfusion 06/2015  . History of kidney stones   . Hyperlipidemia 05/14/2011  . Hypertension   . Iron deficiency anemia   . Lung abnormality 05/11/2011  . Occlusion and stenosis of carotid artery   . Orthostatic hypotension    after surgery.  . Overactive bladder   . PAD (peripheral artery disease) ([place])    S/p bypass grafting, unclear exactly where  . Pneumonia    hx of  . Restless leg syndrome   . Stroke ([place]) 05/04/2011   Weakness on Right. Wears leg  brace.  . Urinary incontinence    Past Surgical History:  Procedure Laterality Date  . ABDOMINAL HYSTERECTOMY  2003  . APPENDECTOMY  2011  . BYPASS GRAFT  2003   Abdominal aortic  . CAROTID STENT INSERTION Right 2013   ICA/notes 06/06/2015  . CESAREAN SECTION  1984  . CHOLECYSTECTOMY  1984  . COLONOSCOPY N/A 05/10/2013   Procedure: COLONOSCOPY;  Surgeon: Lafayette Dragon, MD;  Location: [facility] ENDOSCOPY;  Service: Endoscopy;  Laterality: N/A;  . ESOPHAGOGASTRODUODENOSCOPY N/A 05/09/2013   Procedure: ESOPHAGOGASTRODUODENOSCOPY (EGD);  Surgeon: Lafayette Dragon, MD;  Location: [facility] ENDOSCOPY;  Service: Endoscopy;  Laterality: N/A;  . HERNIA REPAIR  2011  . IR GENERIC HISTORICAL  12/17/2015   IR ANGIO INTRA EXTRACRAN SEL COM CAROTID INNOMINATE BILAT MOD SED 12/17/2015 [facility]-INTERV RAD  . IR GENERIC HISTORICAL  12/17/2015   IR ANGIO VERTEBRAL SEL VERTEBRAL UNI R MOD SED 12/17/2015 [facility]-INTERV RAD  . JOINT REPLACEMENT Right   . [place]  . LITHOTRIPSY    . RADIOLOGY WITH ANESTHESIA N/A 12/17/2015   Procedure: RADIOLOGY WITH ANESTHESIA STENT PLACMENT;  Surgeon: Medication Radiologist, MD;  Location: [place];  Service: Radiology;  Laterality: N/A;  . TOTAL HIP ARTHROPLASTY  2008   Right   Social History:   reports that she has quit smoking. Her smoking use included Cigarettes. She smoked 0.00 packs per day for 45.00 years. She quit smokeless tobacco use about 5 years ago. She reports that she does not drink alcohol or use drugs.  Family History  Problem Relation Age of Onset  . Cancer Maternal Aunt     breast    Medications: Patient's Medications  New Prescriptions   No medications on file  Previous Medications   ACETAMINOPHEN (TYLENOL) 325 MG TABLET    Take 650 mg by mouth every 6 (six) hours as needed for pain.   ALBUTEROL (PROVENTIL) (2.5 MG/3ML) 0.083% NEBULIZER SOLUTION    Take 3 mLs (2.5 mg total) by nebulization every 4 (four) hours as needed for wheezing or shortness of  breath.   AMINO ACIDS-PROTEIN HYDROLYS (FEEDING SUPPLEMENT, PRO-STAT SUGAR FREE 64,) LIQD    Take 30 mLs by mouth daily. At 1700   AMLODIPINE (NORVASC) 5 MG TABLET    Take 1 tablet (5 mg total) by mouth daily.   ASPIRIN 81 MG CHEWABLE TABLET    Chew 81 mg by mouth daily.   BISACODYL (DULCOLAX) 10 MG SUPPOSITORY    Place 10 mg rectally daily as needed for moderate constipation.   CHOLECALCIFEROL (VITAMIN D) 1000 UNITS TABLET    Take 1,000 Units by mouth daily.   CLOPIDOGREL (PLAVIX) 75 MG TABLET    Take 75 mg by mouth at bedtime.   DULOXETINE (CYMBALTA) 60 MG CAPSULE    Take 60 mg by mouth daily.   ESTRADIOL (ESTRACE) 0.1 MG/GM VAGINAL CREAM    Place 1 Applicatorful vaginally at bedtime.   FENOFIBRATE (TRICOR) 48  MG TABLET    Take 48 mg by mouth daily.   GABAPENTIN (NEURONTIN) 100 MG CAPSULE    Take 1 capsule (100 mg total) by mouth at bedtime.   HYDRALAZINE (APRESOLINE) 25 MG TABLET    Take 25 mg by mouth 3 (three) times daily.   INSULIN ASPART (NOVOLOG FLEXPEN) 100 UNIT/ML FLEXPEN    Give 10 units SQ with meals. Give an additional 5 units for CBG over 150 with meals.   INSULIN DEGLUDEC (TRESIBA FLEXTOUCH) 100 UNIT/ML SOPN FLEXTOUCH PEN    Inject 24 Units into the skin at bedtime.    LINACLOTIDE (LINZESS) 145 MCG CAPS CAPSULE    Take 145 mcg by mouth daily before breakfast.   LISINOPRIL (PRINIVIL,ZESTRIL) 2.5 MG TABLET    Take 2.5 mg by mouth daily.   LORATADINE (CLARITIN) 10 MG TABLET    Take 10 mg by mouth daily.   LORAZEPAM (ATIVAN) 0.5 MG TABLET    Take 0.25 mg by mouth daily as needed for anxiety.   MAGNESIUM HYDROXIDE (MILK OF MAGNESIA) 800 MG/5ML SUSPENSION    Take 30 mLs by mouth 2 (two) times daily. 30cc   MENTHOL, TOPICAL ANALGESIC, (BIOFREEZE) 4 % GEL    Apply to right ankle and bilateral knees three times daily as needed.   MULTIPLE VITAMIN (MULTIVITAMIN) TABLET    Take 1 tablet by mouth daily.   MULTIPLE VITAMINS-MINERALS (PRESERVISION AREDS 2+MULTI VIT PO)    Take 1 tablet by mouth  2 (two) times daily.   NITROFURANTOIN, MACROCRYSTAL-MONOHYDRATE, (MACROBID) 100 MG CAPSULE    Take 100 mg by mouth at bedtime.   OMEGA-3 ACID ETHYL ESTERS (LOVAZA) 1 G CAPSULE    Take 2 g by mouth 2 (two) times daily.   OMEPRAZOLE (PRILOSEC) 20 MG CAPSULE    Take 20 mg by mouth daily before breakfast.   POLYETHYLENE GLYCOL 3350 POWD    Take 17 g by mouth 2 (two) times daily.    PROPYLENE GLYCOL (SYSTANE BALANCE) 0.6 % SOLN    Place 1 drop into both eyes 2 (two) times daily.   ROSUVASTATIN (CRESTOR) 20 MG TABLET    Take 20 mg by mouth daily.   SENNOSIDES-DOCUSATE SODIUM (SENOKOT-S) 8.6-50 MG TABLET    Take 2 tablets by mouth 2 (two) times daily.    SOLIFENACIN (VESICARE) 10 MG TABLET    Take 10 mg by mouth daily.   TRAMADOL (ULTRAM) 50 MG TABLET    Take 1 tablet (50 mg total) by mouth every 8 (eight) hours as needed for moderate pain.   TRAZODONE (DESYREL) 100 MG TABLET    Take 100 mg by mouth at bedtime.   VITAMIN C (ASCORBIC ACID) 500 MG TABLET    Take 500 mg by mouth 2 (two) times daily.    WHEAT DEXTRIN (BENEFIBER) POWD    Take 1 scoop by mouth daily. Mix with 8oz of fluid  Modified Medications   No medications on file  Discontinued Medications   No medications on file     Physical Exam: Vitals:   06/20/16 1107  BP: 134/74  Pulse: 87  Resp: (!) 22  Temp: 97.4 F (36.3 C)  TempSrc: Oral  Weight: 151 lb 9.6 oz (68.8 kg)  Height: 5\' 3"  (1.6 m)    Physical Exam  Constitutional: She is oriented to person, place, and time. She appears well-developed and well-nourished. No distress.  + Weight loss  HENT:  Head: Normocephalic and atraumatic.  Right Ear: External ear normal.  Left Ear: External  ear normal.  Mouth/Throat: Oropharynx is clear and moist.  Eyes: Conjunctivae and EOM are normal. Pupils are equal, round, and reactive to light.  Neck: Normal range of motion. Neck supple. No JVD present.  Cardiovascular: Normal rate and regular rhythm.   Murmur heard. Pulmonary/Chest:  Effort normal and breath sounds normal. No respiratory distress. She has no wheezes.  Abdominal: Soft. Bowel sounds are normal. She exhibits no distension. There is no tenderness. There is no rebound.  Rounded abdomen   Genitourinary:  Genitourinary Comments: Reports dysuria with functional/urge incontinence  Musculoskeletal: She exhibits no edema or tenderness.       Left hand: She exhibits normal range of motion and no tenderness. Normal sensation noted. Normal strength noted.  Right sided hemiparesis with mild upper extremity contracture. Uses wheelchair for ambulation. Grip and leg strength on left 5+/5+, 3+/3+ on right  Lymphadenopathy:    She has no cervical adenopathy.  Neurological: She is alert and oriented to person, place, and time.  Skin: Skin is warm and dry. She is not diaphoretic.  Psychiatric: She has a normal mood and affect.    Labs reviewed: Basic Metabolic Panel:  Recent Labs  10/12/15 1028 10/12/15 1145 12/17/15 0732  04/18/16 1415 05/08/16 05/21/16  NA 128* 128* 135  < > 135* 136* 139  K 4.9 4.7 4.3  < > 4.8 4.7 5.9*  CL 95* 94* 100*  --   --   --   --   CO2 24 24 26   --  22  --   --   GLUCOSE 463* 413* 175*  --  222*  --   --   BUN 26* 26* 28*  < > 46.8* 29* 42*  CREATININE 1.52* 1.51* 1.58*  < > 1.6* 1.4* 1.7*  CALCIUM 9.9 9.9 10.2  --  10.0  --   --   < > = values in this interval not displayed. Liver Function Tests:  Recent Labs  10/12/15 1028 03/27/16 04/18/16 1415 05/08/16  AST 18 12* 15 17  ALT 23 11 13 18   ALKPHOS 71 87 78 161*  BILITOT 0.6  --  0.27  --   PROT 6.8  --  7.3  --   ALBUMIN 3.2*  --  3.0*  --    No results for input(s): LIPASE, AMYLASE in the last 8760 hours. No results for input(s): AMMONIA in the last 8760 hours. CBC:  Recent Labs  10/19/15 1408 12/17/15 0732  04/18/16 1415 04/29/16 05/21/16  WBC 8.5 7.2  < > 7.7 9.9 7.0  NEUTROABS 6.3 4.3  --  5.1  --   --   HGB 10.1* 10.2*  < > 8.8* 8.8* 9.5*  HCT 30.9* 32.8*  <  > 27.1* 28* 30*  MCV 93.2 94.0  --  88.8  --   --   PLT 280 323  < > 258 301 283  < > = values in this interval not displayed. TSH:  Recent Labs  04/29/16 05/21/16  TSH 0.27* 0.51   A1C: Lab Results  Component Value Date   HGBA1C 5.3 05/21/2016   Lipid Panel:  Recent Labs  01/09/16 03/04/16  CHOL 189 117  HDL 28* 35  LDLCALC 115 55  TRIG 229* 139    Assessment/Plan  1. Moderate episode of recurrent major depressive disorder (HCC) -Stable. Continue current regimen of Cymbalta. -Discussed the benefits of her getting out of the facility on her own   2. Type 2 diabetes mellitus with stage 3  chronic kidney disease, with long-term current use of insulin (HCC) -Stable. Continue current medication regimen  -A1c at goal in march.   3. Chronic constipation -Stable. Pt stopped taking her Miralax BID due to softer stools, however is reluctant to change any further therapies. She prefers current bowel frequency as opposed to painful constipation. Will change miralax to daily at this time -Encouraged to maintain adequate oral fluid intake to prevent dehydration or constipation    4. Weight loss -Stable at 151lb.  -Encouraged to maintain or increase oral intake.   -Will monitor per staff for further loss.  5. Anemia hgb has improved with last cbc, conts on iron.   6. Abnormal TSH Follow up TSH has been stable.   Carlos American. Harle Battiest  [facility] [phone] 8 am - 5 pm) [phone] (after hours)

## 2016-06-21 DIAGNOSIS — D500 Iron deficiency anemia secondary to blood loss (chronic): Secondary | ICD-10-CM | POA: Diagnosis not present

## 2016-06-21 DIAGNOSIS — E1129 Type 2 diabetes mellitus with other diabetic kidney complication: Secondary | ICD-10-CM | POA: Diagnosis not present

## 2016-06-21 DIAGNOSIS — D509 Iron deficiency anemia, unspecified: Secondary | ICD-10-CM | POA: Diagnosis not present

## 2016-06-21 DIAGNOSIS — E222 Syndrome of inappropriate secretion of antidiuretic hormone: Secondary | ICD-10-CM | POA: Diagnosis not present

## 2016-06-21 LAB — HEPATIC FUNCTION PANEL
ALK PHOS: 79 U/L (ref 25–125)
ALT: 14 U/L (ref 7–35)
AST: 16 U/L (ref 13–35)
BILIRUBIN, TOTAL: 0.2 mg/dL

## 2016-06-21 LAB — BASIC METABOLIC PANEL
BUN: 29 mg/dL — AB (ref 4–21)
Creatinine: 1.4 mg/dL — AB (ref 0.5–1.1)
Glucose: 105 mg/dL
Potassium: 4.5 mmol/L (ref 3.4–5.3)
Sodium: 142 mmol/L (ref 137–147)

## 2016-07-16 DIAGNOSIS — F411 Generalized anxiety disorder: Secondary | ICD-10-CM | POA: Diagnosis not present

## 2016-07-16 DIAGNOSIS — F339 Major depressive disorder, recurrent, unspecified: Secondary | ICD-10-CM | POA: Diagnosis not present

## 2016-07-16 DIAGNOSIS — G4700 Insomnia, unspecified: Secondary | ICD-10-CM | POA: Diagnosis not present

## 2016-07-18 ENCOUNTER — Non-Acute Institutional Stay (SKILLED_NURSING_FACILITY): Payer: Medicare Other | Admitting: Nurse Practitioner

## 2016-07-18 ENCOUNTER — Encounter: Payer: Self-pay | Admitting: Nurse Practitioner

## 2016-07-18 DIAGNOSIS — E1122 Type 2 diabetes mellitus with diabetic chronic kidney disease: Secondary | ICD-10-CM | POA: Diagnosis not present

## 2016-07-18 DIAGNOSIS — K5909 Other constipation: Secondary | ICD-10-CM | POA: Diagnosis not present

## 2016-07-18 DIAGNOSIS — Z794 Long term (current) use of insulin: Secondary | ICD-10-CM

## 2016-07-18 DIAGNOSIS — I6990 Unspecified sequelae of unspecified cerebrovascular disease: Secondary | ICD-10-CM

## 2016-07-18 DIAGNOSIS — N183 Chronic kidney disease, stage 3 unspecified: Secondary | ICD-10-CM

## 2016-07-18 DIAGNOSIS — N3281 Overactive bladder: Secondary | ICD-10-CM

## 2016-07-18 DIAGNOSIS — G4700 Insomnia, unspecified: Secondary | ICD-10-CM

## 2016-07-18 DIAGNOSIS — F411 Generalized anxiety disorder: Secondary | ICD-10-CM | POA: Diagnosis not present

## 2016-07-18 DIAGNOSIS — Z87440 Personal history of urinary (tract) infections: Secondary | ICD-10-CM | POA: Diagnosis not present

## 2016-07-18 NOTE — Progress Notes (Signed)
Patient ID: Chelsey Gallagher, female   DOB: 1950-09-23, 65 y.o.   MRN: [id number]    Nursing Home Location:  [facility] Room: [place] of Service: SNF (31)  PCP: Hendricks Limes, MD  Allergies  Allergen Reactions  . Flexeril [Cyclobenzaprine Hcl] Anaphylaxis  . Lyrica [Pregabalin] Other (See Comments)    "wires my up"  . Codeine Nausea And Vomiting    Chief Complaint  Patient presents with  . Medical Management of Chronic Issues    Resident is being seen for a routine visit.     HPI:  Patient is a 66 y.o. female seen today at [facility] for follow up on chronic conditions. Pt with a hx of CVA with spastic hemiplegia, constipation, anxiety, DM, HTN, and hyperlipidemia. There has been no acute issues in the last month. Reports things have been going well. Mood has been good. No increase in anxiety or depression. BM have been good. Occasionally will not take miralax.  Appetite is good. Weight has been stable.  No hypoglycemia.  No increase in pain.  Nursing without concerns and pt does not have complaints.  Review of Systems:  Review of Systems  Constitutional: Negative for activity change, appetite change, chills, fatigue and unexpected weight change.  HENT: Negative for congestion, hearing loss, sinus pressure and sore throat.   Eyes: Negative for visual disturbance.  Respiratory: Negative for apnea, cough, chest tightness, shortness of breath and wheezing.   Cardiovascular: Negative for chest pain, palpitations and leg swelling.  Gastrointestinal: Negative for abdominal distention, abdominal pain, blood in stool, constipation, diarrhea, nausea and vomiting.  Genitourinary: Positive for frequency (vesicare helps). Negative for difficulty urinating, dysuria, hematuria, pelvic pain and vaginal pain.  Musculoskeletal: Negative for arthralgias and myalgias.  Skin: Negative for color change and wound.  Neurological: Positive for weakness. Negative for  dizziness, syncope, facial asymmetry, speech difficulty, light-headedness and headaches. Numbness: right side.  Psychiatric/Behavioral: Negative for behavioral problems and confusion. The patient is not nervous/anxious.     Past Medical History:  Diagnosis Date  . Anxiety   . Aortic stenosis    moderate AS 2014 echo  . Arthritis   . Cataract cortical, senile, left    Dr Gevena Cotton  . Chronic GI bleeding    presumed/notes 06/06/2015  . Chronic pain   . Complication of anesthesia    doesn't wake up good- "usually end up in ICU" also experiences post op nausea and vomiting  . Constipation   . Contracture of elbow   . Contracture of right wrist   . Depression   . Diabetes mellitus   . Diabetic neuropathy (HCC)    Diabetic neuropathy- has inproved since she quit smoking.  . Diabetic neuropathy ([place])   . Difficulty in walking   . Dysphagia, oropharyngeal phase   . Emphysema lung ([place])   . Facial weakness   . Foot drop, left   . Frequent UTI   . GERD (gastroesophageal reflux disease)   . H/O hiatal hernia    second one  . Hearing loss   . Heart murmur    PCP- [facility]- No tx needed.  . Hemiplegia affecting right dominant side ([place])   . History of blood transfusion 06/2015  . History of kidney stones   . Hyperlipidemia 05/14/2011  . Hypertension   . Iron deficiency anemia   . Lung abnormality 05/11/2011  . Occlusion and stenosis of carotid artery   . Orthostatic hypotension  after surgery.  . Overactive bladder   . PAD (peripheral artery disease) ([place])    S/p bypass grafting, unclear exactly where  . Pneumonia    hx of  . Restless leg syndrome   . Stroke ([place]) 05/04/2011   Weakness on Right. Wears leg brace.  . Urinary incontinence    Past Surgical History:  Procedure Laterality Date  . ABDOMINAL HYSTERECTOMY  2003  . APPENDECTOMY  2011  . BYPASS GRAFT  2003   Abdominal aortic  . CAROTID STENT INSERTION Right 2013   ICA/notes 06/06/2015  . CESAREAN  SECTION  1984  . CHOLECYSTECTOMY  1984  . COLONOSCOPY N/A 05/10/2013   Procedure: COLONOSCOPY;  Surgeon: Lafayette Dragon, MD;  Location: [facility] ENDOSCOPY;  Service: Endoscopy;  Laterality: N/A;  . ESOPHAGOGASTRODUODENOSCOPY N/A 05/09/2013   Procedure: ESOPHAGOGASTRODUODENOSCOPY (EGD);  Surgeon: Lafayette Dragon, MD;  Location: [facility] ENDOSCOPY;  Service: Endoscopy;  Laterality: N/A;  . HERNIA REPAIR  2011  . IR GENERIC HISTORICAL  12/17/2015   IR ANGIO INTRA EXTRACRAN SEL COM CAROTID INNOMINATE BILAT MOD SED 12/17/2015 [facility]-INTERV RAD  . IR GENERIC HISTORICAL  12/17/2015   IR ANGIO VERTEBRAL SEL VERTEBRAL UNI R MOD SED 12/17/2015 [facility]-INTERV RAD  . JOINT REPLACEMENT Right   . [place]  . LITHOTRIPSY    . RADIOLOGY WITH ANESTHESIA N/A 12/17/2015   Procedure: RADIOLOGY WITH ANESTHESIA STENT PLACMENT;  Surgeon: Medication Radiologist, MD;  Location: [place];  Service: Radiology;  Laterality: N/A;  . TOTAL HIP ARTHROPLASTY  2008   Right   Social History:   reports that she has quit smoking. Her smoking use included Cigarettes. She smoked 0.00 packs per day for 45.00 years. She quit smokeless tobacco use about 5 years ago. She reports that she does not drink alcohol or use drugs.  Family History  Problem Relation Age of Onset  . Cancer Maternal Aunt        breast    Medications: Patient's Medications  New Prescriptions   No medications on file  Previous Medications   ACETAMINOPHEN (TYLENOL) 325 MG TABLET    Take 650 mg by mouth every 6 (six) hours as needed for pain.   ALBUTEROL (PROVENTIL) (2.5 MG/3ML) 0.083% NEBULIZER SOLUTION    Take 3 mLs (2.5 mg total) by nebulization every 4 (four) hours as needed for wheezing or shortness of breath.   AMINO ACIDS-PROTEIN HYDROLYS (FEEDING SUPPLEMENT, PRO-STAT SUGAR FREE 64,) LIQD    Take 30 mLs by mouth daily. At 1700   AMLODIPINE (NORVASC) 5 MG TABLET    Take 1 tablet (5 mg total) by mouth daily.   ASPIRIN 81 MG CHEWABLE TABLET    Chew 81 mg by  mouth daily.   BISACODYL (DULCOLAX) 10 MG SUPPOSITORY    Place 10 mg rectally daily as needed for moderate constipation.   CHOLECALCIFEROL (VITAMIN D) 1000 UNITS TABLET    Take 1,000 Units by mouth daily.   CLOPIDOGREL (PLAVIX) 75 MG TABLET    Take 75 mg by mouth at bedtime.   DULOXETINE (CYMBALTA) 60 MG CAPSULE    Take 60 mg by mouth daily.   ESTRADIOL (ESTRACE) 0.1 MG/GM VAGINAL CREAM    Place 1 Applicatorful vaginally at bedtime.   FENOFIBRATE (TRICOR) 48 MG TABLET    Take 48 mg by mouth daily.   GABAPENTIN (NEURONTIN) 100 MG CAPSULE    Take 1 capsule (100 mg total) by mouth at bedtime.   HYDRALAZINE (APRESOLINE) 25 MG TABLET  Take 25 mg by mouth 3 (three) times daily.   INSULIN ASPART (NOVOLOG FLEXPEN) 100 UNIT/ML FLEXPEN    Give 10 units SQ with meals. Give an additional 5 units for CBG over 150 with meals.   INSULIN DEGLUDEC (TRESIBA FLEXTOUCH) 100 UNIT/ML SOPN FLEXTOUCH PEN    Inject 24 Units into the skin at bedtime.    LINACLOTIDE (LINZESS) 145 MCG CAPS CAPSULE    Take 145 mcg by mouth daily before breakfast.   LISINOPRIL (PRINIVIL,ZESTRIL) 2.5 MG TABLET    Take 2.5 mg by mouth daily.   LORATADINE (CLARITIN) 10 MG TABLET    Take 10 mg by mouth daily.   LORAZEPAM (ATIVAN) 0.5 MG TABLET    Take 0.25 mg by mouth daily as needed for anxiety.   MAGNESIUM HYDROXIDE (MILK OF MAGNESIA) 800 MG/5ML SUSPENSION    Take 30 mLs by mouth 2 (two) times daily. 30cc   MENTHOL, TOPICAL ANALGESIC, (BIOFREEZE) 4 % GEL    Apply to right ankle and bilateral knees three times daily as needed.   MULTIPLE VITAMIN (MULTIVITAMIN) TABLET    Take 1 tablet by mouth daily.   MULTIPLE VITAMINS-MINERALS (PRESERVISION AREDS 2+MULTI VIT PO)    Take 1 tablet by mouth 2 (two) times daily.   NITROFURANTOIN, MACROCRYSTAL-MONOHYDRATE, (MACROBID) 100 MG CAPSULE    Take 100 mg by mouth at bedtime.   OMEGA-3 ACID ETHYL ESTERS (LOVAZA) 1 G CAPSULE    Take 2 g by mouth 2 (two) times daily.   OMEPRAZOLE (PRILOSEC) 20 MG CAPSULE     Take 20 mg by mouth daily before breakfast.   POLYETHYLENE GLYCOL 3350 POWD    Take 17 g by mouth 2 (two) times daily.    PROPYLENE GLYCOL (SYSTANE BALANCE) 0.6 % SOLN    Place 1 drop into both eyes 2 (two) times daily.   ROSUVASTATIN (CRESTOR) 20 MG TABLET    Take 20 mg by mouth daily.   SENNOSIDES-DOCUSATE SODIUM (SENOKOT-S) 8.6-50 MG TABLET    Take 2 tablets by mouth 2 (two) times daily.    SOLIFENACIN (VESICARE) 10 MG TABLET    Take 10 mg by mouth daily.   TRAMADOL (ULTRAM) 50 MG TABLET    Take 1 tablet (50 mg total) by mouth every 8 (eight) hours as needed for moderate pain.   TRAZODONE (DESYREL) 100 MG TABLET    Take 100 mg by mouth at bedtime.   VITAMIN C (ASCORBIC ACID) 500 MG TABLET    Take 500 mg by mouth 2 (two) times daily.    WHEAT DEXTRIN (BENEFIBER) POWD    Take 1 scoop by mouth daily. Mix with 8oz of fluid  Modified Medications   No medications on file  Discontinued Medications   No medications on file     Physical Exam: Vitals:   07/18/16 1253  BP: 133/78  Pulse: 89  Resp: 20  Temp: 97.3 F (36.3 C)  SpO2: 98%  Weight: 153 lb 12.8 oz (69.8 kg)  Height: 5\' 3"  (1.6 m)    Physical Exam  Constitutional: She is oriented to person, place, and time. She appears well-developed and well-nourished. No distress.  HENT:  Head: Normocephalic and atraumatic.  Mouth/Throat: Oropharynx is clear and moist.  Eyes: Conjunctivae and EOM are normal. Pupils are equal, round, and reactive to light.  Neck: Normal range of motion. Neck supple. No JVD present.  Cardiovascular: Normal rate and regular rhythm.   Murmur heard. Pulmonary/Chest: Effort normal and breath sounds normal. No respiratory distress. She has  no wheezes.  Abdominal: Soft. Bowel sounds are normal. She exhibits no distension. There is no tenderness. There is no rebound.  Rounded abdomen - soft  Musculoskeletal: She exhibits no edema or tenderness.       Left hand: She exhibits normal range of motion and no  tenderness. Normal sensation noted. Normal strength noted.  Right sided hemiparesis with mild upper extremity contracture. Uses wheelchair for ambulation. Grip and leg strength on left 5+/5+, 3+/3+ on right  Lymphadenopathy:    She has no cervical adenopathy.  Neurological: She is alert and oriented to person, place, and time.  Skin: Skin is warm and dry. She is not diaphoretic.  Psychiatric: She has a normal mood and affect.    Labs reviewed: Basic Metabolic Panel:  Recent Labs  10/12/15 1028 10/12/15 1145 12/17/15 0732  04/18/16 1415 05/08/16 05/21/16 06/21/16  NA 128* 128* 135  < > 135* 136* 139 142  K 4.9 4.7 4.3  < > 4.8 4.7 5.9* 4.5  CL 95* 94* 100*  --   --   --   --   --   CO2 24 24 26   --  22  --   --   --   GLUCOSE 463* 413* 175*  --  222*  --   --   --   BUN 26* 26* 28*  < > 46.8* 29* 42* 29*  CREATININE 1.52* 1.51* 1.58*  < > 1.6* 1.4* 1.7* 1.4*  CALCIUM 9.9 9.9 10.2  --  10.0  --   --   --   < > = values in this interval not displayed. Liver Function Tests:  Recent Labs  10/12/15 1028  04/18/16 1415 05/08/16 06/21/16  AST 18  < > 15 17 16   ALT 23  < > 13 18 14   ALKPHOS 71  < > 78 161* 79  BILITOT 0.6  --  0.27  --   --   PROT 6.8  --  7.3  --   --   ALBUMIN 3.2*  --  3.0*  --   --   < > = values in this interval not displayed. No results for input(s): LIPASE, AMYLASE in the last 8760 hours. No results for input(s): AMMONIA in the last 8760 hours. CBC:  Recent Labs  10/19/15 1408 12/17/15 0732  04/18/16 1415 04/29/16 05/21/16  WBC 8.5 7.2  < > 7.7 9.9 7.0  NEUTROABS 6.3 4.3  --  5.1  --   --   HGB 10.1* 10.2*  < > 8.8* 8.8* 9.5*  HCT 30.9* 32.8*  < > 27.1* 28* 30*  MCV 93.2 94.0  --  88.8  --   --   PLT 280 323  < > 258 301 283  < > = values in this interval not displayed. TSH:  Recent Labs  04/29/16 05/21/16  TSH 0.27* 0.51   A1C: Lab Results  Component Value Date   HGBA1C 5.3 05/21/2016   Lipid Panel:  Recent Labs  01/09/16 03/04/16    CHOL 189 117  HDL 28* 35  LDLCALC 115 55  TRIG 229* 139    Assessment/Plan 1. Late effects of CVA (cerebrovascular accident) Stable, conts to work with therapy for restorative. conts on plavix and ASA  2. Generalized anxiety disorder Mood has been stable. No increase in anxiety or depression.   3. Insomnia, unspecified type Stable on trazodone.   4. Overactive bladder Following with urology, stable on vericare.   5. Type 2 diabetes  mellitus with stage 3 chronic kidney disease, with long-term current use of insulin (HCC) -A1c at goal, will cont currently regimen   6. Chronic constipation Stable at this time. Occasionally will not take miralax   7. Recurrent UTI -stable, conts on macrobid as prophylactic, following with urology as well.   Carlos American. Harle Battiest  [facility] [phone] 8 am - 5 pm) [phone] (after hours)

## 2016-08-06 ENCOUNTER — Encounter: Payer: Self-pay | Admitting: Nurse Practitioner

## 2016-08-06 ENCOUNTER — Non-Acute Institutional Stay (SKILLED_NURSING_FACILITY): Payer: Medicare Other | Admitting: Nurse Practitioner

## 2016-08-06 DIAGNOSIS — D649 Anemia, unspecified: Secondary | ICD-10-CM | POA: Diagnosis not present

## 2016-08-06 DIAGNOSIS — R0989 Other specified symptoms and signs involving the circulatory and respiratory systems: Secondary | ICD-10-CM | POA: Diagnosis not present

## 2016-08-06 DIAGNOSIS — J189 Pneumonia, unspecified organism: Secondary | ICD-10-CM | POA: Diagnosis not present

## 2016-08-06 DIAGNOSIS — M6281 Muscle weakness (generalized): Secondary | ICD-10-CM | POA: Diagnosis not present

## 2016-08-06 DIAGNOSIS — K5909 Other constipation: Secondary | ICD-10-CM

## 2016-08-06 DIAGNOSIS — R05 Cough: Secondary | ICD-10-CM | POA: Diagnosis not present

## 2016-08-06 DIAGNOSIS — R509 Fever, unspecified: Secondary | ICD-10-CM | POA: Diagnosis not present

## 2016-08-06 DIAGNOSIS — D500 Iron deficiency anemia secondary to blood loss (chronic): Secondary | ICD-10-CM | POA: Diagnosis not present

## 2016-08-06 LAB — BASIC METABOLIC PANEL
BUN: 36 — AB (ref 4–21)
Creatinine: 1.9 — AB (ref 0.5–1.1)
Glucose: 105
POTASSIUM: 4.9 (ref 3.4–5.3)
SODIUM: 130 — AB (ref 137–147)

## 2016-08-06 LAB — CBC AND DIFFERENTIAL
HEMATOCRIT: 27 — AB (ref 36–46)
HEMOGLOBIN: 9 — AB (ref 12.0–16.0)
Neutrophils Absolute: 12
Platelets: 293 (ref 150–399)
WBC: 14.6

## 2016-08-06 NOTE — Progress Notes (Addendum)
Patient ID: Chelsey Gallagher, female   DOB: 1950/09/25, 66 y.o.   MRN: [id number]    Nursing Home Location:  [facility] Room: 104 A  Place of Service: SNF (31)  PCP: Hendricks Limes, MD  Allergies  Allergen Reactions  . Flexeril [Cyclobenzaprine Hcl] Anaphylaxis  . Lyrica [Pregabalin] Other (See Comments)    "wires my up"  . Codeine Nausea And Vomiting    Chief Complaint  Patient presents with  . Acute Visit    Resident is being seen due having a productive cough for 3 days.     HPI:  Patient is a 66 y.o. female seen today at [facility] for cough and fever. Pt with a hx of CVA with spastic hemiplegia, constipation, anxiety, DM, HTN, and hyperlipidemia. Pt reports productive cough with yellow sputum for 3 days. Notes she feels hot today. Decrease appetite but still has eaten some. Trying to increase fluid and drank more water yesterday. Denies shortness of breath, chest pains, palpitation or wheezing. Reports increase fatigue and remains in bed today. Reports mild nasal congestion. Reports constipation. Last BM 4 days ago but has started on miralax and feels confident she will have BM today.   Review of Systems:  Review of Systems  Constitutional: Positive for activity change, appetite change and fatigue. Negative for chills.  HENT: Negative for congestion, hearing loss, postnasal drip, rhinorrhea, sinus pain, sinus pressure and sore throat.   Eyes: Negative for visual disturbance.  Respiratory: Positive for cough. Negative for apnea, chest tightness, shortness of breath and wheezing.   Cardiovascular: Negative for chest pain, palpitations and leg swelling.  Gastrointestinal: Positive for constipation. Negative for abdominal distention, abdominal pain, blood in stool, diarrhea, nausea and vomiting.  Genitourinary: Negative for vaginal pain.  Musculoskeletal: Negative for arthralgias and myalgias.  Skin: Negative for color change and wound.  Neurological:  Positive for weakness and numbness (right side).  Psychiatric/Behavioral: Negative for behavioral problems and confusion. The patient is not nervous/anxious.     Past Medical History:  Diagnosis Date  . Anxiety   . Aortic stenosis    moderate AS 2014 echo  . Arthritis   . Cataract cortical, senile, left    Dr Gevena Cotton  . Chronic GI bleeding    presumed/notes 06/06/2015  . Chronic pain   . Complication of anesthesia    doesn't wake up good- "usually end up in ICU" also experiences post op nausea and vomiting  . Constipation   . Contracture of elbow   . Contracture of right wrist   . Depression   . Diabetes mellitus   . Diabetic neuropathy (HCC)    Diabetic neuropathy- has inproved since she quit smoking.  . Diabetic neuropathy ([place])   . Difficulty in walking   . Dysphagia, oropharyngeal phase   . Emphysema lung ([place])   . Facial weakness   . Foot drop, left   . Frequent UTI   . GERD (gastroesophageal reflux disease)   . H/O hiatal hernia    second one  . Hearing loss   . Heart murmur    PCP- [facility]- No tx needed.  . Hemiplegia affecting right dominant side ([place])   . History of blood transfusion 06/2015  . History of kidney stones   . Hyperlipidemia 05/14/2011  . Hypertension   . Iron deficiency anemia   . Lung abnormality 05/11/2011  . Occlusion and stenosis of carotid artery   . Orthostatic hypotension    after surgery.  Marland Kitchen  Overactive bladder   . PAD (peripheral artery disease) ([place])    S/p bypass grafting, unclear exactly where  . Pneumonia    hx of  . Restless leg syndrome   . Stroke ([place]) 05/04/2011   Weakness on Right. Wears leg brace.  . Urinary incontinence    Past Surgical History:  Procedure Laterality Date  . ABDOMINAL HYSTERECTOMY  2003  . APPENDECTOMY  2011  . BYPASS GRAFT  2003   Abdominal aortic  . CAROTID STENT INSERTION Right 2013   ICA/notes 06/06/2015  . CESAREAN SECTION  1984  . CHOLECYSTECTOMY  1984  . COLONOSCOPY N/A  05/10/2013   Procedure: COLONOSCOPY;  Surgeon: Lafayette Dragon, MD;  Location: [facility] ENDOSCOPY;  Service: Endoscopy;  Laterality: N/A;  . ESOPHAGOGASTRODUODENOSCOPY N/A 05/09/2013   Procedure: ESOPHAGOGASTRODUODENOSCOPY (EGD);  Surgeon: Lafayette Dragon, MD;  Location: [facility] ENDOSCOPY;  Service: Endoscopy;  Laterality: N/A;  . HERNIA REPAIR  2011  . IR GENERIC HISTORICAL  12/17/2015   IR ANGIO INTRA EXTRACRAN SEL COM CAROTID INNOMINATE BILAT MOD SED 12/17/2015 [facility]-INTERV RAD  . IR GENERIC HISTORICAL  12/17/2015   IR ANGIO VERTEBRAL SEL VERTEBRAL UNI R MOD SED 12/17/2015 [facility]INTER[facility] RAD  . JOINT REPLACEMENT Right   . [place]  . LITHOTRIPSY    . RADIOLOGY WITH ANESTHESIA N/A 12/17/2015   Procedure: RADIOLOGY WITH ANESTHESIA STENT PLACMENT;  Surgeon: Medication Radiologist, MD;  Location: [place];  Service: Radiology;  Laterality: N/A;  . TOTAL HIP ARTHROPLASTY  2008   Right   Social History:   reports that she has quit smoking. Her smoking use included Cigarettes. She smoked 0.00 packs per day for 45.00 years. She quit smokeless tobacco use about 5 years ago. She reports that she does not drink alcohol or use drugs.  Family History  Problem Relation Age of Onset  . Cancer Maternal Aunt        breast    Medications: Patient's Medications  New Prescriptions   No medications on file  Previous Medications   ACETAMINOPHEN (TYLENOL) 325 MG TABLET    Take 650 mg by mouth every 6 (six) hours as needed for pain.   ALBUTEROL (PROVENTIL) (2.5 MG/3ML) 0.083% NEBULIZER SOLUTION    Take 3 mLs (2.5 mg total) by nebulization every 4 (four) hours as needed for wheezing or shortness of breath.   AMINO ACIDS-PROTEIN HYDROLYS (FEEDING SUPPLEMENT, PRO-STAT SUGAR FREE 64,) LIQD    Take 30 mLs by mouth daily. At 1700   AMLODIPINE (NORVASC) 5 MG TABLET    Take 1 tablet (5 mg total) by mouth daily.   ASPIRIN 81 MG CHEWABLE TABLET    Chew 81 mg by mouth daily.   BISACODYL (DULCOLAX) 10 MG SUPPOSITORY     Place 10 mg rectally daily as needed for moderate constipation.   CHOLECALCIFEROL (VITAMIN D) 1000 UNITS TABLET    Take 1,000 Units by mouth daily.   CLOPIDOGREL (PLAVIX) 75 MG TABLET    Take 75 mg by mouth at bedtime.   DULOXETINE (CYMBALTA) 60 MG CAPSULE    Take 60 mg by mouth daily.   ESTRADIOL (ESTRACE) 0.1 MG/GM VAGINAL CREAM    Place 1 Applicatorful vaginally at bedtime.   FENOFIBRATE (TRICOR) 48 MG TABLET    Take 48 mg by mouth daily.   GABAPENTIN (NEURONTIN) 100 MG CAPSULE    Take 1 capsule (100 mg total) by mouth at bedtime.   HYDRALAZINE (APRESOLINE) 25 MG TABLET    Take 25 mg by  mouth 3 (three) times daily.   INSULIN ASPART (NOVOLOG FLEXPEN) 100 UNIT/ML FLEXPEN    Give 10 units SQ with meals. Give an additional 5 units for CBG over 150 with meals.   INSULIN DEGLUDEC (TRESIBA FLEXTOUCH) 100 UNIT/ML SOPN FLEXTOUCH PEN    Inject 24 Units into the skin at bedtime.    LINACLOTIDE (LINZESS) 145 MCG CAPS CAPSULE    Take 145 mcg by mouth daily before breakfast.   LISINOPRIL (PRINIVIL,ZESTRIL) 2.5 MG TABLET    Take 2.5 mg by mouth daily.   LORATADINE (CLARITIN) 10 MG TABLET    Take 10 mg by mouth daily.   LORAZEPAM (ATIVAN) 0.5 MG TABLET    Take 0.25 mg by mouth daily as needed for anxiety.   MAGNESIUM HYDROXIDE (MILK OF MAGNESIA) 800 MG/5ML SUSPENSION    Take 30 mLs by mouth 2 (two) times daily. 30cc   MENTHOL, TOPICAL ANALGESIC, (BIOFREEZE) 4 % GEL    Apply to right ankle and bilateral knees three times daily as needed.   MULTIPLE VITAMIN (MULTIVITAMIN) TABLET    Take 1 tablet by mouth daily.   MULTIPLE VITAMINS-MINERALS (PRESERVISION AREDS 2+MULTI VIT PO)    Take 1 tablet by mouth 2 (two) times daily.   NITROFURANTOIN, MACROCRYSTAL-MONOHYDRATE, (MACROBID) 100 MG CAPSULE    Take 100 mg by mouth at bedtime.   OMEGA-3 ACID ETHYL ESTERS (LOVAZA) 1 G CAPSULE    Take 2 g by mouth 2 (two) times daily.   OMEPRAZOLE (PRILOSEC) 20 MG CAPSULE    Take 20 mg by mouth daily before breakfast.    POLYETHYLENE GLYCOL 3350 POWD    Take 17 g by mouth 2 (two) times daily.    PROPYLENE GLYCOL (SYSTANE BALANCE) 0.6 % SOLN    Place 1 drop into both eyes 2 (two) times daily.   ROSUVASTATIN (CRESTOR) 20 MG TABLET    Take 20 mg by mouth daily.   SENNOSIDES-DOCUSATE SODIUM (SENOKOT-S) 8.6-50 MG TABLET    Take 2 tablets by mouth 2 (two) times daily.    SOLIFENACIN (VESICARE) 10 MG TABLET    Take 10 mg by mouth daily.   TRAMADOL (ULTRAM) 50 MG TABLET    Take 1 tablet (50 mg total) by mouth every 8 (eight) hours as needed for moderate pain.   TRAZODONE (DESYREL) 100 MG TABLET    Take 100 mg by mouth at bedtime.   VITAMIN C (ASCORBIC ACID) 500 MG TABLET    Take 500 mg by mouth 2 (two) times daily.    WHEAT DEXTRIN (BENEFIBER) POWD    Take 1 scoop by mouth daily. Mix with 8oz of fluid  Modified Medications   No medications on file  Discontinued Medications   No medications on file     Physical Exam: Vitals:   08/06/16 1151  BP: 111/61  Pulse: 93  Resp: 20  Temp: 99.3 F (37.4 C)  SpO2: 95%  Weight: 153 lb (69.4 kg)  Height: 5\' 3"  (1.6 m)    Physical Exam  Constitutional: She is oriented to person, place, and time. She appears well-developed and well-nourished. No distress.  HENT:  Head: Normocephalic and atraumatic.  Right Ear: External ear normal.  Left Ear: External ear normal.  Mouth/Throat: Oropharynx is clear and moist.  Eyes: Conjunctivae and EOM are normal. Pupils are equal, round, and reactive to light.  Neck: Normal range of motion. Neck supple. No JVD present.  Cardiovascular: Normal rate and regular rhythm.   Murmur heard. Pulmonary/Chest: Effort normal. No respiratory distress.  She has decreased breath sounds. She has no wheezes. She has rhonchi.  Diminished breath sounds to bilateral bases  Abdominal: Soft. Bowel sounds are normal. She exhibits no distension and no mass. There is no tenderness. There is no rebound and no guarding.  Rounded abdomen   Lymphadenopathy:      She has no cervical adenopathy.  Neurological: She is alert and oriented to person, place, and time.  Skin: Skin is warm and dry. She is not diaphoretic.  Psychiatric: She has a normal mood and affect.    Labs reviewed: Basic Metabolic Panel:  Recent Labs  10/12/15 1028 10/12/15 1145 12/17/15 0732  04/18/16 1415 05/08/16 05/21/16 06/21/16  NA 128* 128* 135  < > 135* 136* 139 142  K 4.9 4.7 4.3  < > 4.8 4.7 5.9* 4.5  CL 95* 94* 100*  --   --   --   --   --   CO2 24 24 26   --  22  --   --   --   GLUCOSE 463* 413* 175*  --  222*  --   --   --   BUN 26* 26* 28*  < > 46.8* 29* 42* 29*  CREATININE 1.52* 1.51* 1.58*  < > 1.6* 1.4* 1.7* 1.4*  CALCIUM 9.9 9.9 10.2  --  10.0  --   --   --   < > = values in this interval not displayed. Liver Function Tests:  Recent Labs  10/12/15 1028  04/18/16 1415 05/08/16 06/21/16  AST 18  < > 15 17 16   ALT 23  < > 13 18 14   ALKPHOS 71  < > 78 161* 79  BILITOT 0.6  --  0.27  --   --   PROT 6.8  --  7.3  --   --   ALBUMIN 3.2*  --  3.0*  --   --   < > = values in this interval not displayed. No results for input(s): LIPASE, AMYLASE in the last 8760 hours. No results for input(s): AMMONIA in the last 8760 hours. CBC:  Recent Labs  10/19/15 1408 12/17/15 0732  04/18/16 1415 04/29/16 05/21/16  WBC 8.5 7.2  < > 7.7 9.9 7.0  NEUTROABS 6.3 4.3  --  5.1  --   --   HGB 10.1* 10.2*  < > 8.8* 8.8* 9.5*  HCT 30.9* 32.8*  < > 27.1* 28* 30*  MCV 93.2 94.0  --  88.8  --   --   PLT 280 323  < > 258 301 283  < > = values in this interval not displayed. TSH:  Recent Labs  04/29/16 05/21/16  TSH 0.27* 0.51   A1C: Lab Results  Component Value Date   HGBA1C 5.3 05/21/2016   Lipid Panel:  Recent Labs  01/09/16 03/04/16  CHOL 189 117  HDL 28* 35  LDLCALC 115 55  TRIG 229* 139    Assessment/Plan 1. Pneumonia due to infectious organism, unspecified laterality, unspecified part of lung Due to abnormal lung sounds, productive cough and  fever will treat with Avelox 400 mg daily for 7 days.  Will get chest xray, cbc, bmp at this time. To start mucinex DM by mouth twice daily Push fluids Vs q shift.  -to notify for any changes.   2. Constipation -pt currently on linzess daily, senokot-s BID and miralax BID -constipation previously better controlled but now with worsening of constipation. Feels like she will have BM today.  -will  cont current regimen at this time however if constipation conts will consider stopping linzess and start amitiza BID -to increase hydration.  Carlos American. Harle Battiest  [facility] [phone] 8 am - 5 pm) [phone] (after hours)

## 2016-08-07 ENCOUNTER — Other Ambulatory Visit: Payer: Self-pay

## 2016-08-07 ENCOUNTER — Other Ambulatory Visit (HOSPITAL_COMMUNITY): Payer: Self-pay | Admitting: Interventional Radiology

## 2016-08-07 ENCOUNTER — Non-Acute Institutional Stay (SKILLED_NURSING_FACILITY): Payer: Medicare Other | Admitting: Internal Medicine

## 2016-08-07 ENCOUNTER — Encounter: Payer: Self-pay | Admitting: Internal Medicine

## 2016-08-07 DIAGNOSIS — J209 Acute bronchitis, unspecified: Secondary | ICD-10-CM

## 2016-08-07 DIAGNOSIS — I771 Stricture of artery: Secondary | ICD-10-CM

## 2016-08-07 DIAGNOSIS — N289 Disorder of kidney and ureter, unspecified: Secondary | ICD-10-CM

## 2016-08-07 DIAGNOSIS — J0110 Acute frontal sinusitis, unspecified: Secondary | ICD-10-CM | POA: Diagnosis not present

## 2016-08-07 DIAGNOSIS — J440 Chronic obstructive pulmonary disease with acute lower respiratory infection: Secondary | ICD-10-CM | POA: Diagnosis not present

## 2016-08-07 MED ORDER — TRAMADOL HCL 50 MG PO TABS: 50.0000 mg | ORAL_TABLET | Freq: Three times a day (TID) | ORAL | 5 refills | Status: AC | PRN

## 2016-08-07 NOTE — Progress Notes (Signed)
NURSING HOME LOCATION:  [facility] ROOM NUMBER:  [id number]   CODE STATUS:  Full Code  PCP:  Hendricks Limes, MD [address] [place] [address]   This is a nursing facility follow up for specific acute issue of fever and productive cough.  Interim medical record and care since last [place] visit was updated with review of diagnostic studies and change in clinical status since last visit were documented.  HPI: The patient was seen yesterday by the nurse practitioner for cough and fever. She described a cough productive of yellow sputum present for 3 days. This was associated with some anorexia. She also had been bedridden because of increasing fatigue. Chest x-ray revealed no active disease. At this time she states that the cough is only intermittently productive and she describes a left frontal headache. She had previously had some bloody nasal discharge from the left nare, but this has resolved. She also describes matting of her eyes. She's had a sore throat but attributes this to dryness. She has pleuritic-type discomfort with deep breathing or cough. Additionally the cough is aggravating her incisional hernia. Blood pressure was unusually low for her yesterday. Typically blood pressures been as high as 163. Her anemia is slightly worse, hemoglobin 9/hematocrit 27 down from values of 9.5/30. Creatinine was 1.4 on 4/21, yesterday rose to 1.9. GFR dropped to 31. She is on Vesicare 10 mg daily which would be above the recommended maximum dose of 5 mg daily for this GFR  Review of systems:  Eyes: No redness, definite discharge, pain, vision change ENT/mouth: No active nasal congestion,  purulent discharge, earache,new change in hearing   Cardiovascular: No non pleuritic chest pain, palpitations,paroxysmal nocturnal dyspnea, claudication, edema  Respiratory: No hemoptysis,  Gastrointestinal: No heartburn,dysphagia,nausea / vomiting,rectal bleeding, melena,change in  bowels Genitourinary: No dysuria,hematuria, pyuria,  incontinence, nocturia Dermatologic: No rash, pruritus, change in appearance of skin Hematologic/lymphatic: No significant bruising, lymphadenopathy,abnormal bleeding Allergy/immunology: No itchy/ watery eyes, significant sneezing, urticaria, angioedema  Physical exam:  Pertinent or positive findings: She is very lethargic. In fact she was sleeping when I entered the room. The left nasolabial fold is decreased. She has marked decrease in hearing. There is some increased cerumen in the right otic canal. Left otic canal is clear. Tongue is dry. Grade 1. 5-2 systolic murmur is present at the base. She has low-grade dry rales. Incisional hernia R abdomen.  The right upper extremity is flexed across chest & has decreased ROM as does the right lower extremity. Atrophy present. Pedal pulses are decreased, especially dorsalis pedis pulses. She is slightly diaphoretic. Trace edema present.  General appearance:Adequately nourished; no acute distress , increased work of breathing is present.   Lymphatic: No lymphadenopathy about the head, neck, axilla . Eyes: No conjunctival inflammation or lid edema is present. There is no scleral icterus. Ears:  External ear exam shows no significant lesions or deformities.   Nose:  External nasal examination shows no deformity or inflammation. Nasal mucosa are pink and moist without lesions ,exudates Oral exam: lips and gums are healthy appearing.There is no oropharyngeal erythema or exudate . Neck:  No thyromegaly, masses, tenderness noted.    Heart:  Normal rate and regular rhythm. S1 and S2 normal without gallop, click, rub .  Abdomen:Bowel sounds are normal. Abdomen is soft and nontender with no organomegaly. GU: deferred  Extremities:  No cyanosis, clubbing Skin: w/o tenting. No significant lesions or rash.  #1 acute sinusitis/ bronchitis suggested by history #2  progressive renal insufficiency Plan:  continue pulmonary toilet & Avelox #2 decrease Vesicare dose; encourage oral hydration

## 2016-08-07 NOTE — Telephone Encounter (Signed)
Faxed to [facility] Fax Number: [phone], Phone Number [phone]  

## 2016-08-08 ENCOUNTER — Inpatient Hospital Stay (HOSPITAL_COMMUNITY)
Admission: EM | Admit: 2016-08-08 | Discharge: 2016-08-31 | DRG: 871 | Disposition: E | Payer: Medicare Other | Attending: Pulmonary Disease | Admitting: Pulmonary Disease

## 2016-08-08 ENCOUNTER — Encounter: Payer: Self-pay | Admitting: Internal Medicine

## 2016-08-08 ENCOUNTER — Encounter (HOSPITAL_COMMUNITY): Payer: Self-pay | Admitting: *Deleted

## 2016-08-08 ENCOUNTER — Other Ambulatory Visit (HOSPITAL_COMMUNITY): Payer: Medicare Other

## 2016-08-08 ENCOUNTER — Inpatient Hospital Stay (HOSPITAL_COMMUNITY): Payer: Medicare Other

## 2016-08-08 ENCOUNTER — Emergency Department (HOSPITAL_COMMUNITY): Payer: Medicare Other

## 2016-08-08 DIAGNOSIS — I10 Essential (primary) hypertension: Secondary | ICD-10-CM | POA: Diagnosis not present

## 2016-08-08 DIAGNOSIS — I679 Cerebrovascular disease, unspecified: Secondary | ICD-10-CM | POA: Diagnosis not present

## 2016-08-08 DIAGNOSIS — M1990 Unspecified osteoarthritis, unspecified site: Secondary | ICD-10-CM | POA: Diagnosis present

## 2016-08-08 DIAGNOSIS — A4151 Sepsis due to Escherichia coli [E. coli]: Secondary | ICD-10-CM | POA: Diagnosis not present

## 2016-08-08 DIAGNOSIS — I469 Cardiac arrest, cause unspecified: Secondary | ICD-10-CM | POA: Diagnosis not present

## 2016-08-08 DIAGNOSIS — J189 Pneumonia, unspecified organism: Secondary | ICD-10-CM | POA: Diagnosis present

## 2016-08-08 DIAGNOSIS — R402343 Coma scale, best motor response, flexion withdrawal, at hospital admission: Secondary | ICD-10-CM | POA: Diagnosis present

## 2016-08-08 DIAGNOSIS — I672 Cerebral atherosclerosis: Secondary | ICD-10-CM | POA: Diagnosis not present

## 2016-08-08 DIAGNOSIS — D509 Iron deficiency anemia, unspecified: Secondary | ICD-10-CM | POA: Diagnosis present

## 2016-08-08 DIAGNOSIS — E872 Acidosis, unspecified: Secondary | ICD-10-CM

## 2016-08-08 DIAGNOSIS — I462 Cardiac arrest due to underlying cardiac condition: Secondary | ICD-10-CM | POA: Diagnosis present

## 2016-08-08 DIAGNOSIS — I129 Hypertensive chronic kidney disease with stage 1 through stage 4 chronic kidney disease, or unspecified chronic kidney disease: Secondary | ICD-10-CM | POA: Diagnosis present

## 2016-08-08 DIAGNOSIS — Z885 Allergy status to narcotic agent status: Secondary | ICD-10-CM

## 2016-08-08 DIAGNOSIS — I639 Cerebral infarction, unspecified: Secondary | ICD-10-CM | POA: Diagnosis not present

## 2016-08-08 DIAGNOSIS — D6489 Other specified anemias: Secondary | ICD-10-CM | POA: Diagnosis present

## 2016-08-08 DIAGNOSIS — N179 Acute kidney failure, unspecified: Secondary | ICD-10-CM | POA: Diagnosis not present

## 2016-08-08 DIAGNOSIS — E785 Hyperlipidemia, unspecified: Secondary | ICD-10-CM | POA: Diagnosis present

## 2016-08-08 DIAGNOSIS — J9601 Acute respiratory failure with hypoxia: Secondary | ICD-10-CM | POA: Diagnosis not present

## 2016-08-08 DIAGNOSIS — E871 Hypo-osmolality and hyponatremia: Secondary | ICD-10-CM | POA: Diagnosis present

## 2016-08-08 DIAGNOSIS — R140 Abdominal distension (gaseous): Secondary | ICD-10-CM | POA: Diagnosis not present

## 2016-08-08 DIAGNOSIS — Z66 Do not resuscitate: Secondary | ICD-10-CM | POA: Diagnosis present

## 2016-08-08 DIAGNOSIS — J439 Emphysema, unspecified: Secondary | ICD-10-CM | POA: Diagnosis present

## 2016-08-08 DIAGNOSIS — Z7982 Long term (current) use of aspirin: Secondary | ICD-10-CM

## 2016-08-08 DIAGNOSIS — Z4682 Encounter for fitting and adjustment of non-vascular catheter: Secondary | ICD-10-CM | POA: Diagnosis not present

## 2016-08-08 DIAGNOSIS — I350 Nonrheumatic aortic (valve) stenosis: Secondary | ICD-10-CM | POA: Diagnosis present

## 2016-08-08 DIAGNOSIS — N390 Urinary tract infection, site not specified: Secondary | ICD-10-CM | POA: Diagnosis present

## 2016-08-08 DIAGNOSIS — E1122 Type 2 diabetes mellitus with diabetic chronic kidney disease: Secondary | ICD-10-CM | POA: Diagnosis present

## 2016-08-08 DIAGNOSIS — I4901 Ventricular fibrillation: Secondary | ICD-10-CM | POA: Diagnosis present

## 2016-08-08 DIAGNOSIS — G931 Anoxic brain damage, not elsewhere classified: Secondary | ICD-10-CM | POA: Diagnosis present

## 2016-08-08 DIAGNOSIS — G9341 Metabolic encephalopathy: Secondary | ICD-10-CM | POA: Diagnosis not present

## 2016-08-08 DIAGNOSIS — E1140 Type 2 diabetes mellitus with diabetic neuropathy, unspecified: Secondary | ICD-10-CM | POA: Diagnosis present

## 2016-08-08 DIAGNOSIS — R6521 Severe sepsis with septic shock: Secondary | ICD-10-CM | POA: Diagnosis present

## 2016-08-08 DIAGNOSIS — R1312 Dysphagia, oropharyngeal phase: Secondary | ICD-10-CM | POA: Diagnosis present

## 2016-08-08 DIAGNOSIS — Z803 Family history of malignant neoplasm of breast: Secondary | ICD-10-CM

## 2016-08-08 DIAGNOSIS — R402213 Coma scale, best verbal response, none, at hospital admission: Secondary | ICD-10-CM | POA: Diagnosis not present

## 2016-08-08 DIAGNOSIS — G2581 Restless legs syndrome: Secondary | ICD-10-CM | POA: Diagnosis present

## 2016-08-08 DIAGNOSIS — A419 Sepsis, unspecified organism: Secondary | ICD-10-CM

## 2016-08-08 DIAGNOSIS — Z794 Long term (current) use of insulin: Secondary | ICD-10-CM

## 2016-08-08 DIAGNOSIS — D649 Anemia, unspecified: Secondary | ICD-10-CM | POA: Diagnosis not present

## 2016-08-08 DIAGNOSIS — I69351 Hemiplegia and hemiparesis following cerebral infarction affecting right dominant side: Secondary | ICD-10-CM

## 2016-08-08 DIAGNOSIS — R092 Respiratory arrest: Secondary | ICD-10-CM | POA: Diagnosis not present

## 2016-08-08 DIAGNOSIS — E875 Hyperkalemia: Secondary | ICD-10-CM | POA: Diagnosis present

## 2016-08-08 DIAGNOSIS — N183 Chronic kidney disease, stage 3 (moderate): Secondary | ICD-10-CM | POA: Diagnosis present

## 2016-08-08 DIAGNOSIS — R579 Shock, unspecified: Secondary | ICD-10-CM | POA: Diagnosis not present

## 2016-08-08 DIAGNOSIS — H9190 Unspecified hearing loss, unspecified ear: Secondary | ICD-10-CM | POA: Diagnosis present

## 2016-08-08 DIAGNOSIS — E1151 Type 2 diabetes mellitus with diabetic peripheral angiopathy without gangrene: Secondary | ICD-10-CM | POA: Diagnosis present

## 2016-08-08 DIAGNOSIS — Z888 Allergy status to other drugs, medicaments and biological substances status: Secondary | ICD-10-CM

## 2016-08-08 DIAGNOSIS — Z87891 Personal history of nicotine dependence: Secondary | ICD-10-CM

## 2016-08-08 DIAGNOSIS — G8929 Other chronic pain: Secondary | ICD-10-CM | POA: Diagnosis present

## 2016-08-08 DIAGNOSIS — I214 Non-ST elevation (NSTEMI) myocardial infarction: Secondary | ICD-10-CM | POA: Diagnosis not present

## 2016-08-08 DIAGNOSIS — I6529 Occlusion and stenosis of unspecified carotid artery: Secondary | ICD-10-CM | POA: Diagnosis present

## 2016-08-08 DIAGNOSIS — J9690 Respiratory failure, unspecified, unspecified whether with hypoxia or hypercapnia: Secondary | ICD-10-CM | POA: Diagnosis not present

## 2016-08-08 DIAGNOSIS — R402113 Coma scale, eyes open, never, at hospital admission: Secondary | ICD-10-CM | POA: Diagnosis present

## 2016-08-08 DIAGNOSIS — K219 Gastro-esophageal reflux disease without esophagitis: Secondary | ICD-10-CM | POA: Diagnosis present

## 2016-08-08 DIAGNOSIS — E119 Type 2 diabetes mellitus without complications: Secondary | ICD-10-CM | POA: Diagnosis not present

## 2016-08-08 DIAGNOSIS — Z9911 Dependence on respirator [ventilator] status: Secondary | ICD-10-CM

## 2016-08-08 DIAGNOSIS — Z515 Encounter for palliative care: Secondary | ICD-10-CM | POA: Diagnosis not present

## 2016-08-08 DIAGNOSIS — N3000 Acute cystitis without hematuria: Secondary | ICD-10-CM

## 2016-08-08 LAB — RESPIRATORY PANEL BY PCR
ADENOVIRUS-RVPPCR: NOT DETECTED
Bordetella pertussis: NOT DETECTED
CORONAVIRUS 229E-RVPPCR: NOT DETECTED
CORONAVIRUS HKU1-RVPPCR: NOT DETECTED
CORONAVIRUS NL63-RVPPCR: NOT DETECTED
CORONAVIRUS OC43-RVPPCR: NOT DETECTED
Chlamydophila pneumoniae: NOT DETECTED
Influenza A: NOT DETECTED
Influenza B: NOT DETECTED
MYCOPLASMA PNEUMONIAE-RVPPCR: NOT DETECTED
Metapneumovirus: NOT DETECTED
PARAINFLUENZA VIRUS 1-RVPPCR: NOT DETECTED
Parainfluenza Virus 2: NOT DETECTED
Parainfluenza Virus 3: NOT DETECTED
Parainfluenza Virus 4: NOT DETECTED
Respiratory Syncytial Virus: NOT DETECTED
Rhinovirus / Enterovirus: NOT DETECTED

## 2016-08-08 LAB — RAPID URINE DRUG SCREEN, HOSP PERFORMED
Amphetamines: NOT DETECTED
BARBITURATES: NOT DETECTED
BENZODIAZEPINES: NOT DETECTED
COCAINE: NOT DETECTED
OPIATES: NOT DETECTED
Tetrahydrocannabinol: NOT DETECTED

## 2016-08-08 LAB — I-STAT ARTERIAL BLOOD GAS, ED
Acid-base deficit: 16 mmol/L — ABNORMAL HIGH (ref 0.0–2.0)
Bicarbonate: 12.5 mmol/L — ABNORMAL LOW (ref 20.0–28.0)
O2 SAT: 99 %
PCO2 ART: 40.8 mmHg (ref 32.0–48.0)
PH ART: 7.092 — AB (ref 7.350–7.450)
Patient temperature: 98.4
TCO2: 14 mmol/L (ref 0–100)
pO2, Arterial: 222 mmHg — ABNORMAL HIGH (ref 83.0–108.0)

## 2016-08-08 LAB — URINALYSIS, ROUTINE W REFLEX MICROSCOPIC
Bilirubin Urine: NEGATIVE
GLUCOSE, UA: NEGATIVE mg/dL
Ketones, ur: NEGATIVE mg/dL
NITRITE: NEGATIVE
PH: 5 (ref 5.0–8.0)
Protein, ur: 100 mg/dL — AB
SPECIFIC GRAVITY, URINE: 1.01 (ref 1.005–1.030)

## 2016-08-08 LAB — CBC
HCT: 22.6 % — ABNORMAL LOW (ref 36.0–46.0)
HEMATOCRIT: 29.9 % — AB (ref 36.0–46.0)
HEMATOCRIT: 24.7 % — AB (ref 36.0–46.0)
HEMOGLOBIN: 7 g/dL — AB (ref 12.0–15.0)
HEMOGLOBIN: 7.8 g/dL — AB (ref 12.0–15.0)
Hemoglobin: 9.1 g/dL — ABNORMAL LOW (ref 12.0–15.0)
MCH: 28.1 pg (ref 26.0–34.0)
MCH: 27.2 pg (ref 26.0–34.0)
MCH: 28.1 pg (ref 26.0–34.0)
MCHC: 30.4 g/dL (ref 30.0–36.0)
MCHC: 30.5 g/dL (ref 30.0–36.0)
MCHC: 31.6 g/dL (ref 30.0–36.0)
MCV: 92.3 fL (ref 78.0–100.0)
MCV: 89 fL (ref 78.0–100.0)
MCV: 88.8 fL (ref 78.0–100.0)
PLATELETS: 345 10*3/uL (ref 150–400)
Platelets: 308 10*3/uL (ref 150–400)
Platelets: 264 10*3/uL (ref 150–400)
RBC: 3.24 MIL/uL — ABNORMAL LOW (ref 3.87–5.11)
RBC: 2.54 MIL/uL — AB (ref 3.87–5.11)
RBC: 2.78 MIL/uL — AB (ref 3.87–5.11)
RDW: 15.6 % — ABNORMAL HIGH (ref 11.5–15.5)
RDW: 15.4 % (ref 11.5–15.5)
RDW: 15.6 % — ABNORMAL HIGH (ref 11.5–15.5)
WBC: 29 10*3/uL — AB (ref 4.0–10.5)
WBC: 34.2 10*3/uL — ABNORMAL HIGH (ref 4.0–10.5)
WBC: 41.5 10*3/uL — ABNORMAL HIGH (ref 4.0–10.5)

## 2016-08-08 LAB — BASIC METABOLIC PANEL
ANION GAP: 17 — AB (ref 5–15)
ANION GAP: 21 — AB (ref 5–15)
BUN: 34 mg/dL — ABNORMAL HIGH (ref 6–20)
BUN: 35 mg/dL — ABNORMAL HIGH (ref 6–20)
CALCIUM: 6.5 mg/dL — AB (ref 8.9–10.3)
CALCIUM: 6.7 mg/dL — AB (ref 8.9–10.3)
CHLORIDE: 100 mmol/L — AB (ref 101–111)
CO2: 14 mmol/L — ABNORMAL LOW (ref 22–32)
CO2: 14 mmol/L — ABNORMAL LOW (ref 22–32)
CREATININE: 2.39 mg/dL — AB (ref 0.44–1.00)
Chloride: 98 mmol/L — ABNORMAL LOW (ref 101–111)
Creatinine, Ser: 2.55 mg/dL — ABNORMAL HIGH (ref 0.44–1.00)
GFR calc non Af Amer: 20 mL/min — ABNORMAL LOW (ref >60)
GFR, EST AFRICAN AMERICAN: 24 mL/min — AB (ref >60)
GFR, EST AFRICAN AMERICAN: 22 mL/min — AB (ref >60)
GFR, EST NON AFRICAN AMERICAN: 19 mL/min — AB (ref >60)
GLUCOSE: 407 mg/dL — AB (ref 65–99)
Glucose, Bld: 300 mg/dL — ABNORMAL HIGH (ref 65–99)
POTASSIUM: 3.6 mmol/L (ref 3.5–5.1)
Potassium: 3.6 mmol/L (ref 3.5–5.1)
Sodium: 131 mmol/L — ABNORMAL LOW (ref 135–145)
Sodium: 133 mmol/L — ABNORMAL LOW (ref 135–145)

## 2016-08-08 LAB — BLOOD CULTURE ID PANEL (REFLEXED)
ACINETOBACTER BAUMANNII: NOT DETECTED
CANDIDA ALBICANS: NOT DETECTED
CANDIDA GLABRATA: NOT DETECTED
CANDIDA KRUSEI: NOT DETECTED
CANDIDA PARAPSILOSIS: NOT DETECTED
CANDIDA TROPICALIS: NOT DETECTED
Carbapenem resistance: NOT DETECTED
ENTEROBACTER CLOACAE COMPLEX: NOT DETECTED
Enterobacteriaceae species: DETECTED — AB
Enterococcus species: NOT DETECTED
Escherichia coli: DETECTED — AB
HAEMOPHILUS INFLUENZAE: NOT DETECTED
KLEBSIELLA OXYTOCA: NOT DETECTED
KLEBSIELLA PNEUMONIAE: NOT DETECTED
Listeria monocytogenes: NOT DETECTED
Neisseria meningitidis: NOT DETECTED
PROTEUS SPECIES: NOT DETECTED
Pseudomonas aeruginosa: NOT DETECTED
STREPTOCOCCUS SPECIES: NOT DETECTED
Serratia marcescens: NOT DETECTED
Staphylococcus aureus (BCID): NOT DETECTED
Staphylococcus species: NOT DETECTED
Streptococcus agalactiae: NOT DETECTED
Streptococcus pneumoniae: NOT DETECTED
Streptococcus pyogenes: NOT DETECTED

## 2016-08-08 LAB — COMPREHENSIVE METABOLIC PANEL
ALT: 109 U/L — ABNORMAL HIGH (ref 14–54)
AST: 243 U/L — ABNORMAL HIGH (ref 15–41)
Albumin: 2.1 g/dL — ABNORMAL LOW (ref 3.5–5.0)
Alkaline Phosphatase: 257 U/L — ABNORMAL HIGH (ref 38–126)
Anion gap: 18 — ABNORMAL HIGH (ref 5–15)
BILIRUBIN TOTAL: 1.2 mg/dL (ref 0.3–1.2)
BUN: 38 mg/dL — ABNORMAL HIGH (ref 6–20)
CHLORIDE: 98 mmol/L — AB (ref 101–111)
CO2: 12 mmol/L — ABNORMAL LOW (ref 22–32)
CREATININE: 2.56 mg/dL — AB (ref 0.44–1.00)
Calcium: 8.9 mg/dL (ref 8.9–10.3)
GFR, EST AFRICAN AMERICAN: 22 mL/min — AB (ref >60)
GFR, EST NON AFRICAN AMERICAN: 19 mL/min — AB (ref >60)
Glucose, Bld: 200 mg/dL — ABNORMAL HIGH (ref 65–99)
POTASSIUM: 5.9 mmol/L — AB (ref 3.5–5.1)
Sodium: 128 mmol/L — ABNORMAL LOW (ref 135–145)
TOTAL PROTEIN: 6.7 g/dL (ref 6.5–8.1)

## 2016-08-08 LAB — MAGNESIUM
MAGNESIUM: 2.2 mg/dL (ref 1.7–2.4)
MAGNESIUM: 1.7 mg/dL (ref 1.7–2.4)

## 2016-08-08 LAB — BLOOD GAS, ARTERIAL
Acid-base deficit: 16.7 mmol/L — ABNORMAL HIGH (ref 0.0–2.0)
BICARBONATE: 10 mmol/L — AB (ref 20.0–28.0)
DRAWN BY: 441351
FIO2: 50
MECHVT: 480 mL
O2 Saturation: 96 %
PEEP: 5 cmH2O
PO2 ART: 98.3 mmHg (ref 83.0–108.0)
Patient temperature: 97
RATE: 18 resp/min
pCO2 arterial: 26.4 mmHg — ABNORMAL LOW (ref 32.0–48.0)
pH, Arterial: 7.195 — CL (ref 7.350–7.450)

## 2016-08-08 LAB — STREP PNEUMONIAE URINARY ANTIGEN: Strep Pneumo Urinary Antigen: NEGATIVE

## 2016-08-08 LAB — I-STAT CHEM 8, ED
BUN: 53 mg/dL — ABNORMAL HIGH (ref 6–20)
Calcium, Ion: 1.15 mmol/L (ref 1.15–1.40)
Chloride: 102 mmol/L (ref 101–111)
Creatinine, Ser: 2.6 mg/dL — ABNORMAL HIGH (ref 0.44–1.00)
Glucose, Bld: 200 mg/dL — ABNORMAL HIGH (ref 65–99)
HEMATOCRIT: 29 % — AB (ref 36.0–46.0)
HEMOGLOBIN: 9.9 g/dL — AB (ref 12.0–15.0)
Potassium: 5.8 mmol/L — ABNORMAL HIGH (ref 3.5–5.1)
SODIUM: 129 mmol/L — AB (ref 135–145)
TCO2: 14 mmol/L (ref 0–100)

## 2016-08-08 LAB — I-STAT TROPONIN, ED: TROPONIN I, POC: 2.86 ng/mL — AB (ref 0.00–0.08)

## 2016-08-08 LAB — TSH: TSH: 0.892 u[IU]/mL (ref 0.350–4.500)

## 2016-08-08 LAB — I-STAT CG4 LACTIC ACID, ED: Lactic Acid, Venous: 10.78 mmol/L (ref 0.5–1.9)

## 2016-08-08 LAB — CORTISOL: Cortisol, Plasma: 51.5 ug/dL

## 2016-08-08 LAB — GLUCOSE, CAPILLARY: GLUCOSE-CAPILLARY: 355 mg/dL — AB (ref 65–99)

## 2016-08-08 LAB — MRSA PCR SCREENING: MRSA by PCR: NEGATIVE

## 2016-08-08 LAB — CBG MONITORING, ED: Glucose-Capillary: 185 mg/dL — ABNORMAL HIGH (ref 65–99)

## 2016-08-08 LAB — TROPONIN I: Troponin I: 32.89 ng/mL (ref <0.03)

## 2016-08-08 LAB — PROTIME-INR
INR: 1.78
PROTHROMBIN TIME: 20.9 s — AB (ref 11.4–15.2)

## 2016-08-08 LAB — HIV ANTIBODY (ROUTINE TESTING W REFLEX): HIV Screen 4th Generation wRfx: NONREACTIVE

## 2016-08-08 LAB — BRAIN NATRIURETIC PEPTIDE: B Natriuretic Peptide: 374.8 pg/mL — ABNORMAL HIGH (ref 0.0–100.0)

## 2016-08-08 LAB — LACTIC ACID, PLASMA: Lactic Acid, Venous: 7.8 mmol/L (ref 0.5–1.9)

## 2016-08-08 LAB — T4, FREE: FREE T4: 1.49 ng/dL — AB (ref 0.61–1.12)

## 2016-08-08 LAB — APTT: aPTT: 49 seconds — ABNORMAL HIGH (ref 24–36)

## 2016-08-08 MED ORDER — MIDAZOLAM HCL 2 MG/2ML IJ SOLN: 2.0000 mg | INTRAMUSCULAR | Status: DC | PRN

## 2016-08-08 MED ORDER — POLYVINYL ALCOHOL 1.4 % OP SOLN
1.0000 [drp] | Freq: Four times a day (QID) | OPHTHALMIC | Status: DC | PRN
  Filled 2016-08-08: qty 15

## 2016-08-08 MED ORDER — GLYCOPYRROLATE 0.2 MG/ML IJ SOLN: 0.2000 mg | INTRAMUSCULAR | Status: DC | PRN

## 2016-08-08 MED ORDER — CHLORHEXIDINE GLUCONATE 0.12% ORAL RINSE (MEDLINE KIT)
15.0000 mL | Freq: Two times a day (BID) | OROMUCOSAL | Status: DC
  Administered 2016-08-08: 15 mL via OROMUCOSAL

## 2016-08-08 MED ORDER — ACETAMINOPHEN 650 MG RE SUPP
650.0000 mg | Freq: Once | RECTAL | Status: AC
  Administered 2016-08-08: 650 mg via RECTAL

## 2016-08-08 MED ORDER — NOREPINEPHRINE BITARTRATE 1 MG/ML IV SOLN
0.0000 ug/min | INTRAVENOUS | Status: DC
  Administered 2016-08-08: 5 ug/min via INTRAVENOUS
  Filled 2016-08-08: qty 4

## 2016-08-08 MED ORDER — INSULIN ASPART 100 UNIT/ML ~~LOC~~ SOLN: 2.0000 [IU] | SUBCUTANEOUS | Status: DC

## 2016-08-08 MED ORDER — PHENYLEPHRINE HCL 10 MG/ML IJ SOLN
0.0000 ug/min | Freq: Once | INTRAVENOUS | Status: AC
  Administered 2016-08-08: 30 ug/min via INTRAVENOUS
  Filled 2016-08-08: qty 1

## 2016-08-08 MED ORDER — SODIUM BICARBONATE 8.4 % IV SOLN
INTRAVENOUS | Status: AC | PRN
  Administered 2016-08-08: 100 meq via INTRAVENOUS

## 2016-08-08 MED ORDER — ONDANSETRON 4 MG PO TBDP: 4.0000 mg | ORAL_TABLET | Freq: Four times a day (QID) | ORAL | Status: DC | PRN

## 2016-08-08 MED ORDER — HEPARIN SODIUM (PORCINE) 5000 UNIT/ML IJ SOLN: 5000.0000 [IU] | Freq: Three times a day (TID) | INTRAMUSCULAR | Status: DC

## 2016-08-08 MED ORDER — SODIUM CHLORIDE 0.9 % IV SOLN: 250.0000 mL | INTRAVENOUS | Status: DC | PRN

## 2016-08-08 MED ORDER — SODIUM CHLORIDE 0.9 % IV BOLUS (SEPSIS)
1000.0000 mL | Freq: Once | INTRAVENOUS | Status: AC
  Administered 2016-08-08: 1000 mL via INTRAVENOUS

## 2016-08-08 MED ORDER — SODIUM BICARBONATE 8.4 % IV SOLN
100.0000 meq | Freq: Once | INTRAVENOUS | Status: AC
  Administered 2016-08-08: 100 meq via INTRAVENOUS
  Filled 2016-08-08: qty 100

## 2016-08-08 MED ORDER — VANCOMYCIN HCL IN DEXTROSE 1-5 GM/200ML-% IV SOLN
1000.0000 mg | Freq: Once | INTRAVENOUS | Status: AC
Start: 2016-08-08 — End: 2016-08-08
  Administered 2016-08-08: 1000 mg via INTRAVENOUS
  Filled 2016-08-08: qty 200

## 2016-08-08 MED ORDER — MAGNESIUM SULFATE 4 GM/100ML IV SOLN
INTRAVENOUS | Status: AC
  Administered 2016-08-08: 4 g
  Filled 2016-08-08: qty 100

## 2016-08-08 MED ORDER — AMIODARONE HCL IN DEXTROSE 360-4.14 MG/200ML-% IV SOLN
INTRAVENOUS | Status: AC
  Filled 2016-08-08: qty 200

## 2016-08-08 MED ORDER — SODIUM BICARBONATE 8.4 % IV SOLN
INTRAVENOUS | Status: DC
  Administered 2016-08-08 (×2): via INTRAVENOUS
  Filled 2016-08-08 (×3): qty 150

## 2016-08-08 MED ORDER — ORAL CARE MOUTH RINSE
15.0000 mL | OROMUCOSAL | Status: DC
  Administered 2016-08-08: 15 mL via OROMUCOSAL

## 2016-08-08 MED ORDER — FENTANYL CITRATE (PF) 100 MCG/2ML IJ SOLN: 100.0000 ug | INTRAMUSCULAR | Status: DC | PRN

## 2016-08-08 MED ORDER — STERILE WATER FOR INJECTION IV SOLN: INTRAVENOUS | Status: DC

## 2016-08-08 MED ORDER — SODIUM CHLORIDE 0.9 % IV SOLN: INTRAVENOUS | Status: DC | PRN

## 2016-08-08 MED ORDER — LORAZEPAM 2 MG/ML IJ SOLN: 1.0000 mg | INTRAMUSCULAR | Status: DC | PRN

## 2016-08-08 MED ORDER — SODIUM BICARBONATE 8.4 % IV SOLN
100.0000 meq | Freq: Once | INTRAVENOUS | Status: DC
  Filled 2016-08-08: qty 50

## 2016-08-08 MED ORDER — BIOTENE DRY MOUTH MT LIQD: 15.0000 mL | OROMUCOSAL | Status: DC | PRN

## 2016-08-08 MED ORDER — ONDANSETRON HCL 4 MG/2ML IJ SOLN: 4.0000 mg | Freq: Four times a day (QID) | INTRAMUSCULAR | Status: DC | PRN

## 2016-08-08 MED ORDER — PANTOPRAZOLE SODIUM 40 MG IV SOLR
40.0000 mg | INTRAVENOUS | Status: DC
  Administered 2016-08-08: 40 mg via INTRAVENOUS
  Filled 2016-08-08: qty 40

## 2016-08-08 MED ORDER — PIPERACILLIN-TAZOBACTAM 3.375 G IVPB 30 MIN
3.3750 g | Freq: Once | INTRAVENOUS | Status: AC
  Administered 2016-08-08: 3.375 g via INTRAVENOUS
  Filled 2016-08-08: qty 50

## 2016-08-08 MED ORDER — SODIUM CHLORIDE 0.9 % IV BOLUS (SEPSIS)
250.0000 mL | Freq: Once | INTRAVENOUS | Status: AC
  Administered 2016-08-08: 250 mL via INTRAVENOUS

## 2016-08-08 MED ORDER — LORAZEPAM 2 MG/ML PO CONC: 1.0000 mg | ORAL | Status: DC | PRN

## 2016-08-08 MED ORDER — MORPHINE BOLUS VIA INFUSION
2.0000 mg | INTRAVENOUS | Status: DC | PRN
Start: 2016-08-08 — End: 2016-08-08
  Filled 2016-08-08: qty 2

## 2016-08-08 MED ORDER — SODIUM POLYSTYRENE SULFONATE 15 GM/60ML PO SUSP
15.0000 g | Freq: Once | ORAL | Status: AC
  Administered 2016-08-08: 15 g
  Filled 2016-08-08: qty 60

## 2016-08-08 MED ORDER — IOPAMIDOL (ISOVUE-370) INJECTION 76%
INTRAVENOUS | Status: AC
  Filled 2016-08-08: qty 100

## 2016-08-08 MED ORDER — SODIUM CHLORIDE 0.9 % IV SOLN: INTRAVENOUS | Status: DC

## 2016-08-08 MED ORDER — AMIODARONE HCL IN DEXTROSE 360-4.14 MG/200ML-% IV SOLN
60.0000 mg/h | Freq: Once | INTRAVENOUS | Status: AC
  Administered 2016-08-08: 60 mg/h via INTRAVENOUS

## 2016-08-08 MED ORDER — EPINEPHRINE PF 1 MG/ML IJ SOLN
0.5000 ug/min | INTRAMUSCULAR | Status: DC
  Administered 2016-08-08: 10 ug/min via INTRAVENOUS
  Administered 2016-08-08: 25 ug/min via INTRAVENOUS
  Filled 2016-08-08 (×3): qty 4

## 2016-08-08 MED ORDER — GLYCOPYRROLATE 1 MG PO TABS
1.0000 mg | ORAL_TABLET | ORAL | Status: DC | PRN
  Administered 2016-08-08: 1 mg via ORAL
  Filled 2016-08-08 (×2): qty 1

## 2016-08-08 MED ORDER — LORAZEPAM 1 MG PO TABS: 1.0000 mg | ORAL_TABLET | ORAL | Status: DC | PRN

## 2016-08-08 MED ORDER — EPINEPHRINE PF 1 MG/10ML IJ SOSY
PREFILLED_SYRINGE | INTRAMUSCULAR | Status: AC | PRN
  Administered 2016-08-08: 1 via INTRAVENOUS

## 2016-08-08 MED ORDER — SODIUM CHLORIDE 0.9 % IV SOLN
0.0000 mg/h | INTRAVENOUS | Status: DC
  Administered 2016-08-08: 2 mg/h via INTRAVENOUS
  Filled 2016-08-08: qty 10

## 2016-08-08 MED ORDER — LIDOCAINE HCL (PF) 1 % IJ SOLN
INTRAMUSCULAR | Status: AC
  Filled 2016-08-08: qty 30

## 2016-08-08 MED ORDER — PIPERACILLIN-TAZOBACTAM 3.375 G IVPB
3.3750 g | Freq: Three times a day (TID) | INTRAVENOUS | Status: DC
  Filled 2016-08-08: qty 50

## 2016-08-08 MED FILL — Medication: Qty: 1 | Status: AC

## 2016-08-09 LAB — BLOOD CULTURE ID PANEL (REFLEXED)
ACINETOBACTER BAUMANNII: NOT DETECTED
CANDIDA KRUSEI: NOT DETECTED
Candida albicans: NOT DETECTED
Candida glabrata: NOT DETECTED
Candida parapsilosis: NOT DETECTED
Candida tropicalis: NOT DETECTED
ENTEROCOCCUS SPECIES: NOT DETECTED
ESCHERICHIA COLI: NOT DETECTED
Enterobacter cloacae complex: NOT DETECTED
Enterobacteriaceae species: NOT DETECTED
Haemophilus influenzae: NOT DETECTED
Klebsiella oxytoca: NOT DETECTED
Klebsiella pneumoniae: NOT DETECTED
LISTERIA MONOCYTOGENES: NOT DETECTED
Methicillin resistance: DETECTED — AB
NEISSERIA MENINGITIDIS: NOT DETECTED
PSEUDOMONAS AERUGINOSA: NOT DETECTED
Proteus species: NOT DETECTED
SERRATIA MARCESCENS: NOT DETECTED
STAPHYLOCOCCUS AUREUS BCID: NOT DETECTED
STREPTOCOCCUS AGALACTIAE: NOT DETECTED
STREPTOCOCCUS PNEUMONIAE: NOT DETECTED
STREPTOCOCCUS SPECIES: NOT DETECTED
Staphylococcus species: DETECTED — AB
Streptococcus pyogenes: NOT DETECTED

## 2016-08-09 LAB — URINE CULTURE

## 2016-08-10 LAB — CULTURE, BLOOD (ROUTINE X 2): Special Requests: ADEQUATE

## 2016-08-12 LAB — CULTURE, BLOOD (ROUTINE X 2): SPECIAL REQUESTS: ADEQUATE

## 2016-08-13 ENCOUNTER — Telehealth: Payer: Self-pay | Admitting: Pulmonary Disease

## 2016-08-13 NOTE — Telephone Encounter (Signed)
[facility] brought D/C for Dr.Sood will send with [facility] to [facility] for him to Sign tonight. PWR We received d/c back from Dr.Sood called [facility] to pick it up. PWR

## 2016-08-19 ENCOUNTER — Encounter (HOSPITAL_COMMUNITY): Payer: Self-pay

## 2016-08-19 ENCOUNTER — Encounter (HOSPITAL_COMMUNITY): Payer: Medicare Other

## 2016-08-31 NOTE — Procedures (Signed)
Extubation Procedure Note  Patient Details:   Name: Chelsey Gallagher DOB: [birth year] MRN: [id number]   Airway Documentation:     Evaluation  O2 sats: currently acceptable Complications: No apparent complications Patient did tolerate procedure well. Bilateral Breath Sounds: Rhonchi   No   Pt extubated per Withdrawal of Life Protocol to room air. Pt in no distress. No complications noted. RN at the bedside  Jesse Sans 2016/09/06, 1:49 PM

## 2016-08-31 NOTE — Discharge Summary (Signed)
Chelsey Gallagher was a 66 y.o. female former smoker admitted on Aug 11, 2016 from nursing home with PEA and ventricular fibrillation cardiac arrest.  She had ROSC after 15 minutes.  She was intubated and started on pressors.  Cardiology was consulted and felt she was not candidate for acute coronary intervention.  She was started on antibiotics.  Family arrived and decided for DNR status.  She had episode of torsades.  Family updated, and decision made to transition to comfort measures.  She was extubated and expired on Aug 11, 2016.  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

## 2016-08-31 NOTE — H&P (Signed)
PULMONARY / CRITICAL CARE MEDICINE   Name: Chelsey Gallagher MRN: [id number] DOB: 1950-08-16    ADMISSION DATE:  Aug 15, 2016 CONSULTATION DATE:  08-15-2016  REFERRING MD:  Dr. 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 Upon arrival EMS noted patient to be agonal which progressed to pulseless and apneic, received 3 EPI and shocked 3 times, on arrival to ED patient was PEA. ROSC achieved after an estimated 15 minutes. Patient on EPI, Neo, and Bicarb gtt. CODE STEMI called due to EKG changes and elevated Troponin. Cardiology to bedside. Cancelled. ABG 7.092/40/222. WBC 29, lactic acid 10.78, K 5.8. PCCM asked to admit.   Of note, patient was seen on 6/6 with complaints of 3 days of cough with yellow sputum production and progressive fatigue. Placed on Avelox.   PAST MEDICAL HISTORY :  She  has a past medical history of Anxiety; Aortic stenosis; Arthritis; Cataract cortical, senile, left; Chronic GI bleeding; Chronic pain; Complication of anesthesia; Constipation; Contracture of elbow; Contracture of right wrist; Depression; Diabetes mellitus; Diabetic neuropathy ([place]); Diabetic neuropathy ([place]); Difficulty in walking; Dysphagia, oropharyngeal phase; Emphysema lung ([place]); Facial weakness; Foot drop, left; Frequent UTI; GERD (gastroesophageal reflux disease); H/O hiatal hernia; Hearing loss; Heart murmur; Hemiplegia affecting right dominant side ([place]); History of blood transfusion (06/2015); History of kidney stones; Hyperlipidemia (05/14/2011); Hypertension; Iron deficiency anemia; Lung abnormality (05/11/2011); Occlusion and stenosis of carotid artery; Orthostatic hypotension; Overactive bladder; PAD (peripheral artery disease) ([place]); Pneumonia; Restless leg syndrome; Stroke  ([address]) (05/04/2011); and Urinary incontinence.  PAST SURGICAL HISTORY: She  has a past surgical history that includes Total hip arthroplasty (2008); Abdominal hysterectomy (2003); Cesarean section (1984); Cholecystectomy (1984); Bypass Graft (2003); Kidney stone surgery (1999); Appendectomy (2011); Hernia repair (2011); Lithotripsy; Esophagogastroduodenoscopy (N/A, 05/09/2013); Colonoscopy (N/A, 05/10/2013); Carotid stent insertion (Right, 2013); Joint replacement (Right); Radiology with anesthesia (N/A, 12/17/2015); ir generic historical (12/17/2015); and ir generic historical (12/17/2015).  Allergies  Allergen Reactions  . Flexeril [Cyclobenzaprine Hcl] Anaphylaxis  . Lyrica [Pregabalin] Other (See Comments)    "wires me up"  . Codeine Nausea And Vomiting    No current facility-administered medications on file prior to encounter.    Current Outpatient Prescriptions on File Prior to Encounter  Medication Sig  . acetaminophen (TYLENOL) 325 MG tablet Take 650 mg by mouth every 6 (six) hours as needed for pain.  Marland Kitchen albuterol (PROVENTIL) (2.5 MG/3ML) 0.083% nebulizer solution Take 3 mLs (2.5 mg total) by nebulization every 4 (four) hours as needed for wheezing or shortness of breath.  . Amino Acids-Protein Hydrolys (FEEDING SUPPLEMENT, PRO-STAT SUGAR FREE 64,) LIQD Take 30 mLs by mouth daily. At 1700  . amLODipine (NORVASC) 5 MG tablet Take 1 tablet (5 mg total) by mouth daily.  Marland Kitchen aspirin 81 MG chewable tablet Chew 81 mg by mouth daily.   . bisacodyl (DULCOLAX) 10 MG suppository Place 10 mg rectally daily as needed for moderate constipation.  . cholecalciferol (VITAMIN D) 1000 UNITS tablet Take 1,000 Units by mouth daily.  . clopidogrel (PLAVIX) 75 MG tablet Take 75 mg by mouth at bedtime.  Marland Kitchen dextromethorphan-guaiFENesin (MUCINEX DM) 30-600 MG 12hr tablet Take 1 tablet by mouth 2 (two) times daily. x7 days  . DULoxetine (CYMBALTA) 60 MG capsule Take 60 mg by mouth daily.  Marland Kitchen  estradiol (ESTRACE) 0.1  MG/GM vaginal cream Place 1 Applicatorful vaginally at bedtime.  . Eyelid Cleansers (OCUSOFT EYELID CLEANSING) PADS Apply topically 2 (two) times daily.  . fenofibrate (TRICOR) 48 MG tablet Take 48 mg by mouth daily.   Marland Kitchen gabapentin (NEURONTIN) 100 MG capsule Take 1 capsule (100 mg total) by mouth at bedtime.  . hydrALAZINE (APRESOLINE) 25 MG tablet Take 25 mg by mouth 3 (three) times daily.  . insulin aspart (NOVOLOG FLEXPEN) 100 UNIT/ML FlexPen Give 10 units SQ with meals. Give an additional 5 units for CBG over 150 with meals.  . insulin degludec (TRESIBA FLEXTOUCH) 100 UNIT/ML SOPN FlexTouch Pen Inject 24 Units into the skin at bedtime.   Marland Kitchen linaclotide (LINZESS) 145 MCG CAPS capsule Take 145 mcg by mouth daily before breakfast.  . lisinopril (PRINIVIL,ZESTRIL) 2.5 MG tablet Take 2.5 mg by mouth daily.  Marland Kitchen loratadine (CLARITIN) 10 MG tablet Take 10 mg by mouth daily.  Marland Kitchen LORazepam (ATIVAN) 0.5 MG tablet Take 0.25 mg by mouth daily as needed for anxiety. Take 1/2 tablet to = 0.25 mg  . Menthol, Topical Analgesic, (BIOFREEZE) 4 % GEL Apply to right ankle and bilateral knees three times daily as needed.  . moxifloxacin (AVELOX) 400 MG tablet Take 400 mg by mouth daily at 8 pm. x7 days  . Multiple Vitamin (MULTIVITAMIN) tablet Take 1 tablet by mouth daily.  . Multiple Vitamins-Minerals (PRESERVISION AREDS 2+MULTI VIT PO) Take 1 tablet by mouth 2 (two) times daily.  . nitrofurantoin, macrocrystal-monohydrate, (MACROBID) 100 MG capsule Take 100 mg by mouth at bedtime.  Marland Kitchen omega-3 acid ethyl esters (LOVAZA) 1 g capsule Take 2 g by mouth 2 (two) times daily. Take 2 capsules to = 2 gm  . omeprazole (PRILOSEC) 20 MG capsule Take 20 mg by mouth daily before breakfast.  . Polyethylene Glycol 3350 POWD Take 17 g by mouth daily.   Marland Kitchen Propylene Glycol (SYSTANE BALANCE) 0.6 % SOLN Place 1 drop into both eyes 2 (two) times daily.  . rosuvastatin (CRESTOR) 20 MG tablet Take 20 mg by mouth daily.  .  sennosides-docusate sodium (SENOKOT-S) 8.6-50 MG tablet Take 2 tablets by mouth 2 (two) times daily.   . solifenacin (VESICARE) 10 MG tablet Take 10 mg by mouth daily.  . traMADol (ULTRAM) 50 MG tablet Take 1 tablet (50 mg total) by mouth every 8 (eight) hours as needed for moderate pain.  . traZODone (DESYREL) 100 MG tablet Take 100 mg by mouth at bedtime.  . vitamin C (ASCORBIC ACID) 500 MG tablet Take 500 mg by mouth 2 (two) times daily.   . Wheat Dextrin (BENEFIBER) POWD Take 1 scoop by mouth daily. Mix with 8oz of fluid    FAMILY HISTORY:  Her indicated that her mother is alive. She indicated that her father is deceased. She indicated that her maternal aunt is alive.    SOCIAL HISTORY: She  reports that she has quit smoking. Her smoking use included Cigarettes. She smoked 0.00 packs per day for 45.00 years. She quit smokeless tobacco use about 5 years ago. She reports that she does not drink alcohol or use drugs.  REVIEW OF SYSTEMS:   Unable to review as patient is unresponsive   SUBJECTIVE:  On mechanical ventilation with neo/ammio/and epi gtt.    VITAL SIGNS: BP 92/61   Pulse (!) 102   Temp 100.2 F (37.9 C) (Rectal)   Resp 14   SpO2 96%   HEMODYNAMICS:    VENTILATOR SETTINGS: Vent Mode: PRVC  FiO2 (%):  [100 %] 100 % Set Rate:  [18 bmp] 18 bmp Vt Set:  [480 mL] 480 mL PEEP:  [5 cmH20] 5 cmH20 Plateau Pressure:  [20 cmH20] 20 cmH20  INTAKE / OUTPUT: No intake/output data recorded.  PHYSICAL EXAMINATION: General:  Adult female, no distress  Neuro:  Upward gaze, pupils non-responsive, decorticate HEENT:  ETT in place  Cardiovascular:  Irregular, no MRG Lungs:  Diminished, no crackles/wheeze  Abdomen:  Distended, ABD hernia noted, active bowel sounds  Musculoskeletal:  -edema  Skin:  Warm, dry, intact   LABS:  BMET  Recent Labs Lab 08/06/16 08-28-16 0230 Aug 28, 2016 0237  NA 130* 128* 129*  K 4.9 5.9* 5.8*  CL  --  98* 102  CO2  --  12*  --   BUN 36*  38* 53*  CREATININE 1.9* 2.56* 2.60*  GLUCOSE  --  200* 200*    Electrolytes  Recent Labs Lab 28-Aug-2016 0230  CALCIUM 8.9  MG 2.2    CBC  Recent Labs Lab 08/06/16 Aug 28, 2016 0230 08/28/2016 0237  WBC 14.6 29.0*  --   HGB 9.0* 9.1* 9.9*  HCT 27* 29.9* 29.0*  PLT 293 345  --     Coag's No results for input(s): APTT, INR in the last 168 hours.  Sepsis Markers  Recent Labs Lab 2016-08-28 0238  LATICACIDVEN 10.78*    ABG  Recent Labs Lab 28-Aug-2016 0244  PHART 7.092*  PCO2ART 40.8  PO2ART 222.0*    Liver Enzymes  Recent Labs Lab August 28, 2016 0230  AST 243*  ALT 109*  ALKPHOS 257*  BILITOT 1.2  ALBUMIN 2.1*    Cardiac Enzymes No results for input(s): TROPONINI, PROBNP in the last 168 hours.  Glucose  Recent Labs Lab 08/28/2016 0226  GLUCAP 185*    Imaging Dg Chest Portable 1 View  Result Date: 08/28/16 CLINICAL DATA:  Post CPR, intubation and OG tube placement. EXAM: PORTABLE CHEST 1 VIEW COMPARISON:  Radiographs 10/12/2015 FINDINGS: Endotracheal tube 3.9 cm from the carina. Enteric tube in place, tip below the diaphragm not included in the field of view. Multiple overlying monitoring devices partially obscure evaluation. Low lung volumes. Unchanged heart size and mediastinal contours from prior exam. Mild interstitial coarsening appears chronic. No confluent consolidation, pneumothorax or large pleural effusion. Chronic mild blunting of right costophrenic angle. The bones are under mineralized. IMPRESSION: 1. Endotracheal tube 3.9 cm from the carina. Enteric tube in place, tip below the diaphragm not included in the field of view. 2. Low lung volumes. Electronically Signed   By: Jeb Levering M.D.   On: 2016/08/28 02:44     STUDIES:  CXR 6/8 > Low lung volumes  KUB 6/8 > Tip and side port of the enteric tube below the diaphragm in the stomach. Nonobstructive bowel gas pattern. Probable right femoral catheter, tip in the region of the common femoral  vein ECHO 6/8 >> CT Head 6/8 >>   CULTURES: Blood 6/8 >> Urine 6/8 >> Sputum 6/8 >> RVP 6/8 >> Urine Strep 6/8 >>  ANTIBIOTICS: Vancomycin 6/8 >> Zosyn 6/8 >>  SIGNIFICANT EVENTS: 6/8 Presents to ED post-cardiac arrest   LINES/TUBES: ETT 6/8 >> Right Femoral CVC 6/8 >>  DISCUSSION: 66 year old female presents post-cardiac arrest. Down for estimated 15 minutes. WBC 29, Lactic Acid 10.78, Troponin 2.86   ASSESSMENT / PLAN:  PULMONARY A: Acute Respiratory Insufficieny in setting of Cardiac Arrest  H/O Emphysema  P:   Vent Support  VAP Bundle  Trend ABG/CXR  Pulmonary Hygiene   CARDIOVASCULAR A:  Cardiac Arrest  Elevated Troponin  H/O HTN, HLD, Aortic Stenosis, PAD   P:  Cardiac Monitoring  36 Degree Cooling Protocol  Levophed gtt to maintain MAP > 70 D/C Neo gtt Continue EPI gtt  Place A-Line   Trend Trop   RENAL A:   Acute on Chronic Renal Failure (Creat Baseline 1.4) Anion Gap Metabolic Acidosis  Lactic Acidosis  Hyperkalemia  P:   Trend BMP  Bicarb gtt @ 125 ml/hr  Trend lactic acid  Kayexalate x 1 > Repeat K at 0900  GASTROINTESTINAL A:   GERD H/O GI Bleed  P:   NPO PPI   HEMATOLOGIC A:   Iron Deficiency Anemia  P:  Trend CBC Maintain Hbg > 7  INFECTIOUS A:   Septic Shock  P:   Trend WBC and Fever Curve  Follow culture data Continue Antibiotics as above   ENDOCRINE A:   DM   P:   Trend Glucose  SSI  TSH/T4 Pending   NEUROLOGIC A:   Metabolic vs Anoxic Encephalopathy  H/O CVA with right hemiplegia, anxiety  Chronic Pain, Neuropathy  P:   RASS goal: -1 EEG pending  CT Head pending  Fentanyl/Versed PRN   FAMILY  - Updates: Unable to reach family  - Inter-disciplinary family meet or Palliative Care meeting due by:  08/15/2016  CC Time: 37 minutes   Hayden Pedro, AGACNP-BC [facility]  Pgr: [phone]  PCCM Pgr: [phone]    ATTENDING NOTE / ATTESTATION NOTE :   I have  discussed the case with the resident/APP  Hayden Pedro NP  I agree with the resident/APP's  history, physical examination, assessment, and plans.    I have edited the above note and modified it according to our agreed history, physical examination, assessment and plan.   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 Upon arrival EMS noted patient to be agonal which progressed to pulseless and apneic, received 3 EPI and shocked 3 times, on arrival to ED patient was PEA. ROSC achieved after an estimated 15 minutes. Patient on EPI, Neo, and Bicarb gtt. CODE STEMI called due to EKG changes and elevated Troponin. Cardiology to bedside. Cancelled. ABG 7.092/40/222. WBC 29, lactic acid 10.78, K 5.8. PCCM asked to admit.   Of note, patient was seen on 6/6 with complaints of 3 days of cough with yellow sputum production and progressive fatigue. Placed on Avelox.   Patient was started on epinephrine drip as well as Neo-Synephrine drip for hypotension. She was also started on amiodarone drip and bicarbonate drips.   Vitals:  Vitals:   08-30-2016 0600 30-Aug-2016 0615 08-30-16 0630 Aug 30, 2016 0645  BP: 115/64 134/69 121/66   Pulse: 88 98 93 88  Resp: (!) 30 (!) 30 (!) 30 (!) 30  Temp: (!) 95.4 F (35.2 C)  (!) 95.7 F (35.4 C)   TempSrc:      SpO2: 94% 94% 93% 93%  Weight:      Height:        Constitutional/General: chronically ill,  intubated, sedated, not in any distress  Body mass index is 28.7 kg/m. Wt Readings from Last 3 Encounters:  08-30-2016 73.5 kg (162 lb)  08/07/16 69.4 kg (153 lb)  08/06/16 69.4 kg (153 lb)    HEENT: PERLA, anicteric sclerae. (-) Oral  thrush. Intubated, ETT in place  Neck: No masses. Midline trachea. No JVD, (-) LAD. (-) bruits appreciated.  Respiratory/Chest: Grossly normal chest. (-) deformity. (-)  Accessory muscle use.  Symmetric expansion. Diminished BS on both lower lung zones. (-) wheezing, rhonchi Crackles at bases (-) egophony  Cardiovascular: Regular rate and  rhythm, heart sounds normal, no murmur or gallops,  Trace peripheral edema  Gastrointestinal:  Decreased  bowel sounds. Soft, non-tender. No hepatosplenomegaly.  (-) masses.   Musculoskeletal:  Unable to assess  Extremities: Grossly normal. (-) clubbing, cyanosis.  Trace edema. Cool extremities.  Skin: (-) rash,lesions seen. Pale  Neurological/Psychiatric : sedated, intubated. CN : Pupils were 4 mm nonreactive. No corneals elicited. No facial asymmetry. (+) gag. She is breathing over the vent. Occasional grimacing on deep sternal rub. (+) decortication    CBC Recent Labs    08/06/16  August 28, 2016  0230  08-28-2016  0237  Aug 28, 2016  0522  WBC  14.6  29.0*   --   34.2*  HGB  9.0*  9.1*  9.9*  7.0*  HCT  27*  29.9*  29.0*  22.6*  PLT  293  345   --   308    Coag's Recent Labs     28-Aug-2016  0522  APTT  49*  INR  1.78    BMET Recent Labs     08-28-2016  0230  Aug 28, 2016  0237  08-28-2016  0522  NA  128*  129*  131*  K  5.9*  5.8*  3.6  CL  98*  102  100*  CO2  12*   --   14*  BUN  38*  53*  34*  CREATININE  2.56*  2.60*  2.39*  GLUCOSE  200*  200*  300*    Electrolytes Recent Labs     2016-08-28  0230  2016/08/28  0522  CALCIUM  8.9  6.5*  MG  2.2   --     Sepsis Markers No results for input(s): PROCALCITON, O2SATVEN in the last 72 hours.  Invalid input(s): LACTICACIDVEN  ABG Recent Labs     2016-08-28  0244  2016-08-28  0530  PHART  7.092*  7.195*  PCO2ART  40.8  26.4*  PO2ART  222.0*  98.3    Liver Enzymes Recent Labs     Aug 28, 2016  0230  AST  243*  ALT  109*  ALKPHOS  257*  BILITOT  1.2  ALBUMIN  2.1*    Cardiac Enzymes Recent Labs     2016-08-28  0522  TROPONINI  32.89*    Glucose Recent Labs     August 28, 2016  0226  GLUCAP  185*    Imaging Ct Head Wo Contrast  Result  Date: Aug 28, 2016 CLINICAL DATA:  Post CPR. EXAM: CT HEAD WITHOUT CONTRAST TECHNIQUE: Contiguous axial images were obtained from the base of the skull through the vertex without intravenous contrast. COMPARISON:  Head CT 08/31/2015 FINDINGS: Brain: No evidence of acute infarction, hemorrhage, hydrocephalus, extra-axial collection or mass lesion/mass effect. No evidence of cerebral edema. Gray-white differentiation is preserved. Vascular: Atherosclerosis of skullbase vasculature without hyperdense vessel. Skull: Normal. Negative for fracture or focal lesion. Sinuses/Orbits: Mucosal thickening of the ethmoid air cells, may be secondary to intubation. Mastoid air cells are well-aerated. Probable left cataract resection. Other: None. IMPRESSION: No acute intracranial abnormality. Electronically Signed   By: Jeb Levering M.D.   On: 2016-08-28 05:15   Dg Chest Portable 1 View  Result Date: 2016/08/28 CLINICAL DATA:  Post CPR, intubation and OG tube placement. EXAM: PORTABLE CHEST 1 VIEW COMPARISON:  Radiographs 10/12/2015 FINDINGS: Endotracheal tube 3.9 cm from the carina. Enteric tube in place, tip below the diaphragm not included in the field of view. Multiple overlying monitoring devices partially obscure evaluation. Low lung volumes. Unchanged heart size and mediastinal contours from prior exam. Mild interstitial coarsening appears chronic. No confluent consolidation, pneumothorax or large pleural effusion. Chronic mild blunting of right costophrenic angle. The bones are under mineralized. IMPRESSION: 1. Endotracheal tube 3.9 cm from the carina. Enteric tube in place, tip below the diaphragm not included in the field of view. 2. Low lung volumes. Electronically Signed   By: Jeb Levering M.D.   On: 08/28/2016 02:44   Dg Abd Portable 1v  Result Date: 08-28-16 CLINICAL DATA:  Abdominal distention.  Post CPR. EXAM: PORTABLE ABDOMEN - 1 VIEW COMPARISON:  None. FINDINGS: Tip and side port of the enteric tube  below the diaphragm in the stomach. No bowel dilatation. Generalized paucity of bowel gas. Probable central line from a right femoral approach, tip in the region of the common femoral vein. Cholecystectomy clips in the right upper quadrant. Multiple surgical clips in the pelvis. No evidence of free air. Right hip arthroplasty is partially included. IMPRESSION: 1. Tip and side port of the enteric tube below the diaphragm in the stomach. 2. Nonobstructive bowel gas pattern. 3. Probable right femoral catheter, tip in the region of the common femoral vein. Electronically Signed   By: Jeb Levering M.D.   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 - cont vent support - Poor neurologic exam likely secondary to prolonged cardiac arrest. Depending on how she does, she will nee a brain MRI +/- EEG.  - She has not received any sedation since arrival.   Acute hypoxemic respiratory failure secondary to unable to protect airway + concern for demand ischemia / NSTEMI + Possible HAP (no significant infiltrates on CXR) - Continue ventilatory support. No weaning. - Cardiology has been consulted. Appreciate recommendations. - Continue broad-spectrum antibiotics with vancomycin and Zosyn. - Panculture.   Cardiac arrest, prolonged. With episodes of Vfib.  - Given poor neurologic exam as well as prolonged arrest, plan was to proceed with normothermia protocol. - Continue supportive care. Continue ventilatory support. - Continue amiodarone drip. - Trend troponin. Check 2-D echo.    Shock, likely combination of cardiogenic + septic - Continue epinephrine drip and Neo-Synephrine drip. - Cardiology has been consulted. - Continue broad-spectrum antibiotics. - Trend lactic acid. - Panculture.   Lactic acidosis 2/2 prolonged cardiac arrest - trend lactic acid - cont IVF   AKI and  Metabolic acidosis  - S/P 3 amps Na bicarbonate -  cont bicarbonate drip    Aortic stenosis, moderate - cont IVF for now.  Watch out for congestion   I spent  30  minutes of Critical Care time with this patient today. This is my time spent independent of the APP or resident.   Family   No family at bedside. Team has called several contacts in [facility] to no avail. We left voicemail.   Monica Becton, MD Aug 28, 2016, 6:53 AM [facility] Pager [phone] After 3 pm or if no answer, call [phone]

## 2016-08-31 NOTE — Procedures (Signed)
Arterial Catheter Insertion Procedure Note Chelsey Gallagher [id number] 10-16-1950  Procedure: Insertion of Arterial Catheter  Indications: Blood pressure monitoring and Frequent blood sampling  Procedure Details Consent: Risks of procedure as well as the alternatives and risks of each were explained to the (patient/caregiver).  Consent for procedure obtained. Time Out: Verified patient identification, verified procedure, site/side was marked, verified correct patient position, special equipment/implants available, medications/allergies/relevent history reviewed, required imaging and test results available.  Performed  Maximum sterile technique was used including antiseptics, cap, gloves, gown, hand hygiene, mask and sheet. Skin prep: Chlorhexidine; local anesthetic administered 20 gauge catheter was inserted into right radial artery using the Seldinger technique.  Evaluation Blood flow good; BP tracing good. Complications: No apparent complications.   Chelsey Gallagher 08/12/2016

## 2016-08-31 NOTE — Progress Notes (Signed)
Pharmacy Antibiotic Note  Chelsey Gallagher is a 66 y.o. female admitted on Aug 12, 2016 with AMS/sepsis, acute on chronic renal insufficiency and s/p cardiac arrest.  Pharmacy has been consulted for Vancomycin and Zosyn  Dosing.  Vancomycin 1 g IV given in ED at 0330  Plan: Zosyn 3.375 g IV q8h  F/U renal fxn and redose Vancomycin as indicated.     Temp (24hrs), Avg:98.6 F (37 C), Min:97.3 F (36.3 C), Max:100.2 F (37.9 C)   Recent Labs Lab 08/06/16 08-12-2016 0230 08/12/2016 0237 12-Aug-2016 0238  WBC 14.6 29.0*  --   --   CREATININE 1.9* 2.56* 2.60*  --   LATICACIDVEN  --   --   --  10.78*    Estimated Creatinine Clearance: 20.4 mL/min (A) (by C-G formula based on SCr of 2.6 mg/dL (H)).    Allergies  Allergen Reactions  . Flexeril [Cyclobenzaprine Hcl] Anaphylaxis  . Lyrica [Pregabalin] Other (See Comments)    "wires me up"  . Codeine Nausea And Vomiting     Caryl Pina 2016-08-12 4:30 AM

## 2016-08-31 NOTE — Progress Notes (Signed)
   2016/08/23 0225  Clinical Encounter Type  Visited With Health care provider  Visit Type ED  Spiritual Encounters  Spiritual Needs Emotional  Stress Factors  Patient Stress Factors Health changes  Paged about CPR in Trauma C. Stemi called and cancelled. Message left with family. Await return page should family arrive.

## 2016-08-31 NOTE — ED Notes (Signed)
Charge RN attempted to contact family, no answer. Voicemail was left.

## 2016-08-31 NOTE — Progress Notes (Signed)
  PHARMACY - PHYSICIAN COMMUNICATION CRITICAL VALUE ALERT - BLOOD CULTURE IDENTIFICATION (BCID)  Results for orders placed or performed during the hospital encounter of 03-Sep-2016  Blood Culture ID Panel (Reflexed) (Collected: 09-03-2016  3:31 AM)  Result Value Ref Range   Enterococcus species NOT DETECTED NOT DETECTED   Listeria monocytogenes NOT DETECTED NOT DETECTED   Staphylococcus species DETECTED (A) NOT DETECTED   Staphylococcus aureus NOT DETECTED NOT DETECTED   Methicillin resistance DETECTED (A) NOT DETECTED   Streptococcus species NOT DETECTED NOT DETECTED   Streptococcus agalactiae NOT DETECTED NOT DETECTED   Streptococcus pneumoniae NOT DETECTED NOT DETECTED   Streptococcus pyogenes NOT DETECTED NOT DETECTED   Acinetobacter baumannii NOT DETECTED NOT DETECTED   Enterobacteriaceae species NOT DETECTED NOT DETECTED   Enterobacter cloacae complex NOT DETECTED NOT DETECTED   Escherichia coli NOT DETECTED NOT DETECTED   Klebsiella oxytoca NOT DETECTED NOT DETECTED   Klebsiella pneumoniae NOT DETECTED NOT DETECTED   Proteus species NOT DETECTED NOT DETECTED   Serratia marcescens NOT DETECTED NOT DETECTED   Haemophilus influenzae NOT DETECTED NOT DETECTED   Neisseria meningitidis NOT DETECTED NOT DETECTED   Pseudomonas aeruginosa NOT DETECTED NOT DETECTED   Candida albicans NOT DETECTED NOT DETECTED   Candida glabrata NOT DETECTED NOT DETECTED   Candida krusei NOT DETECTED NOT DETECTED   Candida parapsilosis NOT DETECTED NOT DETECTED   Candida tropicalis NOT DETECTED NOT DETECTED   1/2 BCx now with CONS with methicillin resistance. Likely contaminant.  Patient deceased.  Elicia Lamp P 2016/09/04  1:56 PM

## 2016-08-31 NOTE — Progress Notes (Signed)
Chaplain visited per consult.  Family has decided to proceed with comfort care for their loved one.  They will be removing the breathing equipment and allowing her to pass away peacefully.  Chaplain will remain available for this family and would love to be called if something should come up where there is a need.  You may call the Spiritual Care office at [phone].  Brother of patient states that the patient is not religious but appreciates Chaplain presence and support.  Brother asked if I would come around every hour as I can as they allow their loved one to pass.  He feels the mother may need it.  Chaplain happy to provide care to this family.    2016/08/28 1147  Clinical Encounter Type  Visited With Patient and family together  Visit Type Follow-up;Spiritual support;Patient actively dying  Referral From Family  Consult/Referral To Chaplain  Stress Factors  Family Stress Factors Loss

## 2016-08-31 NOTE — ED Notes (Signed)
Code STEMI cancelled. 

## 2016-08-31 NOTE — Progress Notes (Signed)
Chaplain stopped by to visit with family after loved one passed.  Family appreciative of Chaplain presence.  Family waiting on fingerprints and other things from the nurse.  Family says they are at peace.      August 25, 2016 1507  Clinical Encounter Type  Visited With Family  Visit Type Follow-up;Spiritual support;Social support;Death   Chaplain provided ministry of presence and words of comfort.

## 2016-08-31 NOTE — Care Management Note (Signed)
Case Management Note Marvetta Gibbons RN, BSN Unit 2W-Case Manager-- [place] coverage [phone]  Patient Details  Name: Chelsey Gallagher MRN: [id number] Date of Birth: 1950/08/22  Subjective/Objective: Pt admitted s/p PEA arrest- septic-- currently on vent- with plans for full comfort care once family ready                 Action/Plan: PTA pt lived at [facility] is for full comfort care-   Expected Discharge Date:                  Expected Discharge Plan:  Expired  In-House Referral:     Discharge planning Services  CM Consult  Post Acute Care Choice:    Choice offered to:     DME Arranged:    DME Agency:     HH Arranged:    [place] Agency:     Status of Service:  In process, will continue to follow  If discussed at Long Length of Stay Meetings, dates discussed:    Discharge Disposition:   Additional Comments:  Dawayne Patricia, RN September 04, 2016, 9:48 AM

## 2016-08-31 NOTE — Progress Notes (Signed)
RT responded to code blue, pt taken off ventilator and manually ventilated throughout duration of code. Upon ROSC, pt placed back on ventilator, with fio2 increased to 100%. RT will continue to monitor.

## 2016-08-31 NOTE — Patient Instructions (Signed)
See assessment and plan under each diagnosis in the problem list and acutely for this visit 

## 2016-08-31 NOTE — Code Documentation (Signed)
Pulse check, pulses present. CPR stopped

## 2016-08-31 NOTE — Consult Note (Signed)
Reason for Consult:out of hospital cardiac arrest Referring Physician:CCM  Chelsey Gallagher is an 66 y.o. female.  [name] is 66 year old female with past medical history significant for multiple medical problems, I.e. Hypertension, diabetes mellitus,moderate aortic stenosiscerebral vascular disease status post carotid artery stenting in the past, history of left CVA with right paresis, hyperlipidemia, emphysema, history of GI bleed in the past, chronic kidney disease,resident of [facility] had altered mental status for one hour, followed by agonal respiration and then became apneic and pulseless.  CPR was done and ACLS protocol was followed.  Patient received 3.  Appendectomy and was shocked 3 and out.  Upon arrival in the ED, patient was noted to be in PEA initially code STEMI was called which was canceled by EDP.  s was noted to be septic ,acidotic anemic,with acute renal failure.patient again this morning had V. Fib arrest.  Family decided for DNR as per patient's wishes in the past.  Past Medical History:  Diagnosis Date  . Anxiety   . Aortic stenosis    moderate AS 2014 echo  . Arthritis   . Cataract cortical, senile, left    Dr Chelsey Gallagher  . Chronic GI bleeding    presumed/notes 06/06/2015  . Chronic pain   . Complication of anesthesia    doesn't wake up good- "usually end up in ICU" also experiences post op nausea and vomiting  . Constipation   . Contracture of elbow   . Contracture of right wrist   . Depression   . Diabetes mellitus   . Diabetic neuropathy (HCC)    Diabetic neuropathy- has inproved since she quit smoking.  . Diabetic neuropathy ([place])   . Difficulty in walking   . Dysphagia, oropharyngeal phase   . Emphysema lung ([place])   . Facial weakness   . Foot drop, left   . Frequent UTI   . GERD (gastroesophageal reflux disease)   . H/O hiatal hernia    second one  . Hearing loss   . Heart murmur    PCP- [facility]- No tx needed.  . Hemiplegia  affecting right dominant side ([place])   . History of blood transfusion 06/2015  . History of kidney stones   . Hyperlipidemia 05/14/2011  . Hypertension   . Iron deficiency anemia   . Lung abnormality 05/11/2011  . Occlusion and stenosis of carotid artery   . Orthostatic hypotension    after surgery.  . Overactive bladder   . PAD (peripheral artery disease) ([place])    S/p bypass grafting, unclear exactly where  . Pneumonia    hx of  . Restless leg syndrome   . Stroke ([place]) 05/04/2011   Weakness on Right. Wears leg brace.  . Urinary incontinence     Past Surgical History:  Procedure Laterality Date  . ABDOMINAL HYSTERECTOMY  2003  . APPENDECTOMY  2011  . BYPASS GRAFT  2003   Abdominal aortic  . CAROTID STENT INSERTION Right 2013   ICA/notes 06/06/2015  . CESAREAN SECTION  1984  . CHOLECYSTECTOMY  1984  . COLONOSCOPY N/A 05/10/2013   Procedure: COLONOSCOPY;  Surgeon: Chelsey Dragon, MD;  Location: [facility] ENDOSCOPY;  Service: Endoscopy;  Laterality: N/A;  . ESOPHAGOGASTRODUODENOSCOPY N/A 05/09/2013   Procedure: ESOPHAGOGASTRODUODENOSCOPY (EGD);  Surgeon: Chelsey Dragon, MD;  Location: [facility] ENDOSCOPY;  Service: Endoscopy;  Laterality: N/A;  . HERNIA REPAIR  2011  . IR GENERIC HISTORICAL  12/17/2015   IR ANGIO INTRA EXTRACRAN SEL COM CAROTID INNOMINATE BILAT MOD SED 12/17/2015  [facility]-INTER[facility] RAD  . IR GENERIC HISTORICAL  12/17/2015   IR ANGIO VERTEBRAL SEL VERTEBRAL UNI R MOD SED 12/17/2015 [facility]INTER[facility]  . JOINT REPLACEMENT Right   . [place]  . LITHOTRIPSY    . RADIOLOGY WITH ANESTHESIA N/A 12/17/2015   Procedure: RADIOLOGY WITH ANESTHESIA STENT PLACMENT;  Surgeon: Medication Radiologist, MD;  Location: [facility];  Service: Radiology;  Laterality: N/A;  . TOTAL HIP ARTHROPLASTY  2008   Right    Family History  Problem Relation Age of Onset  . Cancer Maternal Aunt        breast    Social History:  reports that she has quit smoking. Her smoking use included Cigarettes. She  smoked 0.00 packs per day for 45.00 years. She quit smokeless tobacco use about 5 years ago. She reports that she does not drink alcohol or use drugs.  Allergies:  Allergies  Allergen Reactions  . Flexeril [Cyclobenzaprine Hcl] Anaphylaxis  . Lyrica [Pregabalin] Other (See Comments)    "wires me up"  . Codeine Nausea And Vomiting    Medications: I have reviewed the patient's current medications.  Results for orders placed or performed during the hospital encounter of 2016/08/25 (from the past 48 hour(s))  CBG monitoring, ED     Status: Abnormal   Collection Time: 08-25-2016  2:26 AM  Result Value Ref Range   Glucose-Capillary 185 (H) 65 - 99 mg/dL  Magnesium     Status: None   Collection Time: August 25, 2016  2:30 AM  Result Value Ref Range   Magnesium 2.2 1.7 - 2.4 mg/dL  Comprehensive metabolic panel     Status: Abnormal   Collection Time: 08-25-2016  2:30 AM  Result Value Ref Range   Sodium 128 (L) 135 - 145 mmol/L   Potassium 5.9 (H) 3.5 - 5.1 mmol/L   Chloride 98 (L) 101 - 111 mmol/L   CO2 12 (L) 22 - 32 mmol/L   Glucose, Bld 200 (H) 65 - 99 mg/dL   BUN 38 (H) 6 - 20 mg/dL   Creatinine, Ser 2.56 (H) 0.44 - 1.00 mg/dL   Calcium 8.9 8.9 - 10.3 mg/dL   Total Protein 6.7 6.5 - 8.1 g/dL   Albumin 2.1 (L) 3.5 - 5.0 g/dL   AST 243 (H) 15 - 41 U/L   ALT 109 (H) 14 - 54 U/L   Alkaline Phosphatase 257 (H) 38 - 126 U/L   Total Bilirubin 1.2 0.3 - 1.2 mg/dL   GFR calc non Af Amer 19 (L) >60 mL/min   GFR calc Af Amer 22 (L) >60 mL/min    Comment: (NOTE) The eGFR has been calculated using the CKD EPI equation. This calculation has not been validated in all clinical situations. eGFR's persistently <60 mL/min signify possible Chronic Kidney Disease.    Anion gap 18 (H) 5 - 15  CBC     Status: Abnormal   Collection Time: 08-25-2016  2:30 AM  Result Value Ref Range   WBC 29.0 (H) 4.0 - 10.5 K/uL   RBC 3.24 (L) 3.87 - 5.11 MIL/uL   Hemoglobin 9.1 (L) 12.0 - 15.0 g/dL   HCT 29.9 (L) 36.0 -  46.0 %   MCV 92.3 78.0 - 100.0 fL   MCH 28.1 26.0 - 34.0 pg   MCHC 30.4 30.0 - 36.0 g/dL   RDW 15.6 (H) 11.5 - 15.5 %   Platelets 345 150 - 400 K/uL  Brain natriuretic peptide     Status: Abnormal  Collection Time: Aug 29, 2016  2:30 AM  Result Value Ref Range   B Natriuretic Peptide 374.8 (H) 0.0 - 100.0 pg/mL  I-Stat Troponin, ED (not at [facility])     Status: Abnormal   Collection Time: 08/29/2016  2:35 AM  Result Value Ref Range   Troponin i, poc 2.86 (HH) 0.00 - 0.08 ng/mL   Comment NOTIFIED PHYSICIAN    Comment 3            Comment: Due to the release kinetics of cTnI, a negative result within the first hours of the onset of symptoms does not rule out myocardial infarction with certainty. If myocardial infarction is still suspected, repeat the test at appropriate intervals.   I-Stat Chem 8, ED     Status: Abnormal   Collection Time: Aug 29, 2016  2:37 AM  Result Value Ref Range   Sodium 129 (L) 135 - 145 mmol/L   Potassium 5.8 (H) 3.5 - 5.1 mmol/L   Chloride 102 101 - 111 mmol/L   BUN 53 (H) 6 - 20 mg/dL   Creatinine, Ser 2.60 (H) 0.44 - 1.00 mg/dL   Glucose, Bld 200 (H) 65 - 99 mg/dL   Calcium, Ion 1.15 1.15 - 1.40 mmol/L   TCO2 14 0 - 100 mmol/L   Hemoglobin 9.9 (L) 12.0 - 15.0 g/dL   HCT 29.0 (L) 36.0 - 46.0 %  I-Stat CG4 Lactic Acid, ED     Status: Abnormal   Collection Time: 08/29/2016  2:38 AM  Result Value Ref Range   Lactic Acid, Venous 10.78 (HH) 0.5 - 1.9 mmol/L   Comment NOTIFIED PHYSICIAN   I-Stat arterial blood gas, ED     Status: Abnormal   Collection Time: 08/29/2016  2:44 AM  Result Value Ref Range   pH, Arterial 7.092 (LL) 7.350 - 7.450   pCO2 arterial 40.8 32.0 - 48.0 mmHg   pO2, Arterial 222.0 (H) 83.0 - 108.0 mmHg   Bicarbonate 12.5 (L) 20.0 - 28.0 mmol/L   TCO2 14 0 - 100 mmol/L   O2 Saturation 99.0 %   Acid-base deficit 16.0 (H) 0.0 - 2.0 mmol/L   Patient temperature 98.4 F    Collection site RADIAL, ALLEN'S TEST ACCEPTABLE    Drawn by Operator    Sample  type ARTERIAL    Comment NOTIFIED PHYSICIAN   Urinalysis, Routine w reflex microscopic     Status: Abnormal   Collection Time: 08-29-2016  3:40 AM  Result Value Ref Range   Color, Urine AMBER (A) YELLOW    Comment: BIOCHEMICALS MAY BE AFFECTED BY COLOR   APPearance TURBID (A) CLEAR   Specific Gravity, Urine 1.010 1.005 - 1.030   pH 5.0 5.0 - 8.0   Glucose, UA NEGATIVE NEGATIVE mg/dL   Hgb urine dipstick SMALL (A) NEGATIVE   Bilirubin Urine NEGATIVE NEGATIVE   Ketones, ur NEGATIVE NEGATIVE mg/dL   Protein, ur 100 (A) NEGATIVE mg/dL   Nitrite NEGATIVE NEGATIVE   Leukocytes, UA LARGE (A) NEGATIVE   RBC / HPF 6-30 0 - 5 RBC/hpf   WBC, UA TOO NUMEROUS TO COUNT 0 - 5 WBC/hpf   Bacteria, UA MANY (A) NONE SEEN   Squamous Epithelial / LPF 0-5 (A) NONE SEEN   WBC Clumps PRESENT    Mucous PRESENT   MRSA PCR Screening     Status: None   Collection Time: 08/29/16  5:14 AM  Result Value Ref Range   MRSA by PCR NEGATIVE NEGATIVE    Comment:  The GeneXpert MRSA Assay (FDA approved for NASAL specimens only), is one component of a comprehensive MRSA colonization surveillance program. It is not intended to diagnose MRSA infection nor to guide or monitor treatment for MRSA infections.   Lactic acid, plasma     Status: Abnormal   Collection Time: 09/03/16  5:22 AM  Result Value Ref Range   Lactic Acid, Venous 7.8 (HH) 0.5 - 1.9 mmol/L    Comment: CRITICAL RESULT CALLED TO, READ BACK BY AND VERIFIED WITH: R.SCHEMBER,RN [id number] Sep 03, 2016 M.CAMPBELL   Troponin I     Status: Abnormal   Collection Time: 2016/09/03  5:22 AM  Result Value Ref Range   Troponin I 32.89 (HH) <0.03 ng/mL    Comment: CRITICAL RESULT CALLED TO, READ BACK BY AND VERIFIED WITH: R.SCHEMBER,RN [id number] 09/03/2016 M.CAMPBELL   Basic metabolic panel     Status: Abnormal   Collection Time: 2016/09/03  5:22 AM  Result Value Ref Range   Sodium 131 (L) 135 - 145 mmol/L   Potassium 3.6 3.5 - 5.1 mmol/L    Comment: DELTA CHECK  NOTED   Chloride 100 (L) 101 - 111 mmol/L   CO2 14 (L) 22 - 32 mmol/L   Glucose, Bld 300 (H) 65 - 99 mg/dL   BUN 34 (H) 6 - 20 mg/dL   Creatinine, Ser 2.39 (H) 0.44 - 1.00 mg/dL   Calcium 6.5 (L) 8.9 - 10.3 mg/dL    Comment: DELTA CHECK NOTED   GFR calc non Af Amer 20 (L) >60 mL/min   GFR calc Af Amer 24 (L) >60 mL/min    Comment: (NOTE) The eGFR has been calculated using the CKD EPI equation. This calculation has not been validated in all clinical situations. eGFR's persistently <60 mL/min signify possible Chronic Kidney Disease.    Anion gap 17 (H) 5 - 15  Protime-INR now     Status: Abnormal   Collection Time: 03-Sep-2016  5:22 AM  Result Value Ref Range   Prothrombin Time 20.9 (H) 11.4 - 15.2 seconds   INR 1.78   APTT     Status: Abnormal   Collection Time: 09-03-16  5:22 AM  Result Value Ref Range   aPTT 49 (H) 24 - 36 seconds    Comment:        IF BASELINE aPTT IS ELEVATED, SUGGEST PATIENT RISK ASSESSMENT BE USED TO DETERMINE APPROPRIATE ANTICOAGULANT THERAPY.   T4, free     Status: Abnormal   Collection Time: 2016/09/03  5:22 AM  Result Value Ref Range   Free T4 1.49 (H) 0.61 - 1.12 ng/dL    Comment: (NOTE) Biotin ingestion may interfere with free T4 tests. If the results are inconsistent with the TSH level, previous test results, or the clinical presentation, then consider biotin interference. If needed, order repeat testing after stopping biotin.   Cortisol     Status: None   Collection Time: 2016/09/03  5:22 AM  Result Value Ref Range   Cortisol, Plasma 51.5 ug/dL    Comment: (NOTE) AM    6.7 - 22.6 ug/dL PM   <10.0       ug/dL   CBC     Status: Abnormal   Collection Time: September 03, 2016  5:22 AM  Result Value Ref Range   WBC 34.2 (H) 4.0 - 10.5 K/uL   RBC 2.54 (L) 3.87 - 5.11 MIL/uL   Hemoglobin 7.0 (L) 12.0 - 15.0 g/dL    Comment: DELTA CHECK NOTED REPEATED TO VERIFY    HCT 22.6 (  L) 36.0 - 46.0 %   MCV 89.0 78.0 - 100.0 fL   MCH 27.2 26.0 - 34.0 pg   MCHC  30.5 30.0 - 36.0 g/dL   RDW 15.4 11.5 - 15.5 %   Platelets 308 150 - 400 K/uL  TSH     Status: None   Collection Time: Sep 04, 2016  5:22 AM  Result Value Ref Range   TSH 0.892 0.350 - 4.500 uIU/mL    Comment: Performed by a 3rd Generation assay with a functional sensitivity of <=0.01 uIU/mL.  Arterial Blood Gas PRN     Status: Abnormal   Collection Time: 2016-09-04  5:30 AM  Result Value Ref Range   FIO2 50.00    Delivery systems VENTILATOR    Mode PRESSURE REGULATED VOLUME CONTROL    VT 480 mL   LHR 18 resp/min   Peep/cpap 5.0 cm H20   pH, Arterial 7.195 (LL) 7.350 - 7.450    Comment: CRITICAL RESULT CALLED TO, READ BACK BY AND VERIFIED WITH: JOSEPH MEEKS RRT AT 0544 BY Ander Purpura WEBSTER RRT ON 2016/09/04    pCO2 arterial 26.4 (L) 32.0 - 48.0 mmHg   pO2, Arterial 98.3 83.0 - 108.0 mmHg   Bicarbonate 10.0 (L) 20.0 - 28.0 mmol/L   Acid-base deficit 16.7 (H) 0.0 - 2.0 mmol/L   O2 Saturation 96.0 %   Patient temperature 97.0    Collection site A-LINE    Drawn by [id number]    Sample type ARTERIAL DRAW    Allens test (pass/fail) PASS PASS  Urine rapid drug screen (hosp performed)     Status: None   Collection Time: 04-Sep-2016  5:48 AM  Result Value Ref Range   Opiates NONE DETECTED NONE DETECTED   Cocaine NONE DETECTED NONE DETECTED   Benzodiazepines NONE DETECTED NONE DETECTED   Amphetamines NONE DETECTED NONE DETECTED   Tetrahydrocannabinol NONE DETECTED NONE DETECTED   Barbiturates NONE DETECTED NONE DETECTED    Comment:        DRUG SCREEN FOR MEDICAL PURPOSES ONLY.  IF CONFIRMATION IS NEEDED FOR ANY PURPOSE, NOTIFY LAB WITHIN 5 DAYS.        LOWEST DETECTABLE LIMITS FOR URINE DRUG SCREEN Drug Class       Cutoff (ng/mL) Amphetamine      1000 Barbiturate      200 Benzodiazepine   093 Tricyclics       267 Opiates          300 Cocaine          300 THC              50   Strep pneumoniae urinary antigen     Status: None   Collection Time: 09-04-2016  6:00 AM  Result Value Ref  Range   Strep Pneumo Urinary Antigen NEGATIVE NEGATIVE    Comment:        Infection due to S. pneumoniae cannot be absolutely ruled out since the antigen present may be below the detection limit of the test.   Magnesium     Status: None   Collection Time: September 04, 2016  8:25 AM  Result Value Ref Range   Magnesium 1.7 1.7 - 2.4 mg/dL  Basic metabolic panel     Status: Abnormal   Collection Time: 09-04-2016  8:25 AM  Result Value Ref Range   Sodium 133 (L) 135 - 145 mmol/L   Potassium 3.6 3.5 - 5.1 mmol/L   Chloride 98 (L) 101 - 111 mmol/L   CO2 14 (L) 22 -  32 mmol/L   Glucose, Bld 407 (H) 65 - 99 mg/dL   BUN 35 (H) 6 - 20 mg/dL   Creatinine, Ser 2.55 (H) 0.44 - 1.00 mg/dL   Calcium 6.7 (L) 8.9 - 10.3 mg/dL   GFR calc non Af Amer 19 (L) >60 mL/min   GFR calc Af Amer 22 (L) >60 mL/min    Comment: (NOTE) The eGFR has been calculated using the CKD EPI equation. This calculation has not been validated in all clinical situations. eGFR's persistently <60 mL/min signify possible Chronic Kidney Disease.    Anion gap 21 (H) 5 - 15  CBC     Status: Abnormal   Collection Time: 18-Aug-2016  8:25 AM  Result Value Ref Range   WBC 41.5 (H) 4.0 - 10.5 K/uL   RBC 2.78 (L) 3.87 - 5.11 MIL/uL   Hemoglobin 7.8 (L) 12.0 - 15.0 g/dL   HCT 24.7 (L) 36.0 - 46.0 %   MCV 88.8 78.0 - 100.0 fL   MCH 28.1 26.0 - 34.0 pg   MCHC 31.6 30.0 - 36.0 g/dL   RDW 15.6 (H) 11.5 - 15.5 %   Platelets 264 150 - 400 K/uL    Ct Head Wo Contrast  Result Date: Aug 18, 2016 CLINICAL DATA:  Post CPR. EXAM: CT HEAD WITHOUT CONTRAST TECHNIQUE: Contiguous axial images were obtained from the base of the skull through the vertex without intravenous contrast. COMPARISON:  Head CT 08/31/2015 FINDINGS: Brain: No evidence of acute infarction, hemorrhage, hydrocephalus, extra-axial collection or mass lesion/mass effect. No evidence of cerebral edema. Gray-white differentiation is preserved. Vascular: Atherosclerosis of skullbase  vasculature without hyperdense vessel. Skull: Normal. Negative for fracture or focal lesion. Sinuses/Orbits: Mucosal thickening of the ethmoid air cells, may be secondary to intubation. Mastoid air cells are well-aerated. Probable left cataract resection. Other: None. IMPRESSION: No acute intracranial abnormality. Electronically Signed   By: Jeb Levering M.D.   On: 18-Aug-2016 05:15   Dg Chest Portable 1 View  Result Date: Aug 18, 2016 CLINICAL DATA:  Post CPR, intubation and OG tube placement. EXAM: PORTABLE CHEST 1 VIEW COMPARISON:  Radiographs 10/12/2015 FINDINGS: Endotracheal tube 3.9 cm from the carina. Enteric tube in place, tip below the diaphragm not included in the field of view. Multiple overlying monitoring devices partially obscure evaluation. Low lung volumes. Unchanged heart size and mediastinal contours from prior exam. Mild interstitial coarsening appears chronic. No confluent consolidation, pneumothorax or large pleural effusion. Chronic mild blunting of right costophrenic angle. The bones are under mineralized. IMPRESSION: 1. Endotracheal tube 3.9 cm from the carina. Enteric tube in place, tip below the diaphragm not included in the field of view. 2. Low lung volumes. Electronically Signed   By: Jeb Levering M.D.   On: 08/18/16 02:44   Dg Abd Portable 1v  Result Date: 08-18-2016 CLINICAL DATA:  Abdominal distention.  Post CPR. EXAM: PORTABLE ABDOMEN - 1 VIEW COMPARISON:  None. FINDINGS: Tip and side port of the enteric tube below the diaphragm in the stomach. No bowel dilatation. Generalized paucity of bowel gas. Probable central line from a right femoral approach, tip in the region of the common femoral vein. Cholecystectomy clips in the right upper quadrant. Multiple surgical clips in the pelvis. No evidence of free air. Right hip arthroplasty is partially included. IMPRESSION: 1. Tip and side port of the enteric tube below the diaphragm in the stomach. 2. Nonobstructive bowel gas  pattern. 3. Probable right femoral catheter, tip in the region of the common femoral vein. Electronically Signed  By: Jeb Levering M.D.   On: 2016/08/30 04:35    Review of Systems  Unable to perform ROS: Patient unresponsive   Blood pressure 116/62, pulse 87, temperature (!) 95.4 F (35.2 C), temperature source Core (Comment), resp. rate (!) 30, height _0  (1.6 m), weight 73.5 kg (162 lb), SpO2 92 %. Physical Exam  Eyes:  Pupils dilated, fixed  Neck: Normal range of motion. Neck supple. No JVD present. No thyromegaly present.  Cardiovascular:  Tachycardic, S1, S2 normal, 2/6 systolic murmur noted  Respiratory:  Clear anterolaterally  GI: Soft. Bowel sounds are normal. She exhibits distension.  Musculoskeletal: She exhibits no edema, tenderness or deformity.  Neurological:  Intubated, sedated, unresponsive, on multiple pressors    Assessment/Plan: Status post out of hospital V. Fib cardiac arrest. Hypotensive shock, multifactorial, I.e. Septic/cardiogenic. Acute respiratory failure secondary to above. Marked leukocytosis, questionable etiology Metabolic acidosis. Aortic stenosis. Acute on chronic renal insufficiency. Acute on chronic anemia. History of GI bleed in the past History of left CVA with right-sided paralysis Multiple system organ failure.  Hypertension. Diabetes mellitus. 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 Cerebrovascular disease. Plan Continue present care per CCM. Family decided DNR and comfort care as per patient's wishes   Dimas Millin 08-30-2016, 9:08 AM

## 2016-08-31 NOTE — Code Documentation (Cosign Needed)
  Patient Name: Chelsey Gallagher   MRN: [id number]   Date of Birth/ Sex: Jun 30, 1950 , female      Admission Date: Aug 18, 2016  Attending Provider: Corrie Dandy, [place], *  Primary Diagnosis: <principal problem not specified>   Indication: Pt was in her usual state of health until this AM, when she was noted to be V fib. Code blue was subsequently called. At the time of arrival on scene, ACLS protocol was underway and was discontinued by time of arrival. Family was at bedside and did not want to continue further ACLS as per them patient was DNR prior to arrival to hospital.   Technical Description:  - CPR performance duration:  4 minutes  - Was defibrillation or cardioversion used? Yes   - Was external pacer placed? No  - Was patient intubated pre/post CPR? Yes   Medications Administered: Y = Yes; Blank = No Amiodarone    Atropine    Calcium    Epinephrine  1  Lidocaine    Magnesium    Norepinephrine    Phenylephrine    Sodium bicarbonate    Vasopressin     Post CPR evaluation:  - Final Status - Was patient successfully resuscitated ? Yes - What is current rhythm? Sinus tach - What is current hemodynamic status? guarded  Miscellaneous Information:  - Labs sent, including: Per cardiology  - Primary team notified?  Yes  - Family Notified? Yes  - Additional notes/ transfer status: Cardiology arrived and discussed with family; agree on DRN and orders changed to reflect.      Alphonzo Grieve, MD  August 18, 2016, 8:23 AM

## 2016-08-31 NOTE — ED Notes (Signed)
EDP at bedside, preparing for central line

## 2016-08-31 NOTE — Code Documentation (Signed)
BIB EMS from [place], called out for AMS X1 hr. Pt normally A/OX4, ambulatory. Pt was dx with bronchitis 2 days ago and started on abx today. Upon EMS arrival pt has agonal RR then became apneic and pulseless. CPR started. Given 3 epi and shocked 3 times en route. Upon arrival pt in PEA, CPR continued. CBG 185. EDP and RT at bedside.

## 2016-08-31 NOTE — ED Provider Notes (Addendum)
TIME SEEN: 2:19 AM  By signing my name below, I, Margit Banda, attest that this documentation has been prepared under the direction and in the presence of Colbey Wirtanen, Delice Bison, DO. Electronically Signed: Margit Banda, ED Scribe. 09-03-16. 3:20 AM.  LEVEL V CAVEAT: HPI and ROS limited due to cardiac arrest.  CHIEF COMPLAINT: CPR  HPI: Chelsey Gallagher is a 66 y.o. female BIB EMS from [facility] with a PMHx of HTN, DM, HLD, CVA with R spasitc hemiplegia who presents to the Emergency Department with EMS in cardiac arrest. Per EMS, patient comes from [facility] rehabilitation facility. They were called out for altered mental status and state that the patient's nurse reported she was not acting at baseline ~ 1 hour PTA. When they arrived the patient was having agonal respirations and when they transferred her to the stretcher she became apneic and pulseless.  EMS reports that patient was in asystole. They did 2-3 rounds of CPR and then patient went into ventricular fibrillation. She was then defibrillated. She was then in PEA and then went into pulseless V. tach. Per EMS she received a total of 3 epinephrines, 300 mg of IV amiodarone and was defibrillated 3 times. Her capnography was in the 40s. Her blood sugar was 205. Per nursing home documentation, patient is a full code.  EMS placed a left tibial IO.  She had a cough that started three days ago and a fever that started on 08/07/16. She was dx with bronchitis and pneumonia.  Started on Avelox.  ROS: LEVEL V CAVEAT due to cardiac arrest.  PAST MEDICAL HISTORY/PAST SURGICAL HISTORY:  Past Medical History:  Diagnosis Date  . Anxiety   . Aortic stenosis    moderate AS 2014 echo  . Arthritis   . Cataract cortical, senile, left    Dr Gevena Cotton  . Chronic GI bleeding    presumed/notes 06/06/2015  . Chronic pain   . Complication of anesthesia    doesn't wake up good- "usually end up in ICU" also experiences post op nausea and vomiting  . Constipation    . Contracture of elbow   . Contracture of right wrist   . Depression   . Diabetes mellitus   . Diabetic neuropathy (HCC)    Diabetic neuropathy- has inproved since she quit smoking.  . Diabetic neuropathy ([place])   . Difficulty in walking   . Dysphagia, oropharyngeal phase   . Emphysema lung ([place])   . Facial weakness   . Foot drop, left   . Frequent UTI   . GERD (gastroesophageal reflux disease)   . H/O hiatal hernia    second one  . Hearing loss   . Heart murmur    PCP- [facility]- No tx needed.  . Hemiplegia affecting right dominant side ([place])   . History of blood transfusion 06/2015  . History of kidney stones   . Hyperlipidemia 05/14/2011  . Hypertension   . Iron deficiency anemia   . Lung abnormality 05/11/2011  . Occlusion and stenosis of carotid artery   . Orthostatic hypotension    after surgery.  . Overactive bladder   . PAD (peripheral artery disease) ([place])    S/p bypass grafting, unclear exactly where  . Pneumonia    hx of  . Restless leg syndrome   . Stroke ([place]) 05/04/2011   Weakness on Right. Wears leg brace.  . Urinary incontinence     MEDICATIONS:  Prior to Admission medications   Medication Sig Start Date End Date  Taking? Authorizing Provider  acetaminophen (TYLENOL) 325 MG tablet Take 650 mg by mouth every 6 (six) hours as needed for pain.    [provider]  albuterol (PROVENTIL) (2.5 MG/3ML) 0.083% nebulizer solution Take 3 mLs (2.5 mg total) by nebulization every 4 (four) hours as needed for wheezing or shortness of breath. 02/14/14   Theodis Blaze, MD  Amino Acids-Protein Hydrolys (FEEDING SUPPLEMENT, PRO-STAT SUGAR FREE 64,) LIQD Take 30 mLs by mouth daily. At 1700    [provider]  amLODipine (NORVASC) 5 MG tablet Take 1 tablet (5 mg total) by mouth daily. 06/07/15   Ghimire, Henreitta Leber, MD  aspirin 81 MG chewable tablet Chew 81 mg by mouth daily.     [provider]  bisacodyl (DULCOLAX) 10 MG suppository  Place 10 mg rectally daily as needed for moderate constipation.    [provider]  cholecalciferol (VITAMIN D) 1000 UNITS tablet Take 1,000 Units by mouth daily.    [provider]  clopidogrel (PLAVIX) 75 MG tablet Take 75 mg by mouth at bedtime.    [provider]  dextromethorphan-guaiFENesin (MUCINEX DM) 30-600 MG 12hr tablet Take 1 tablet by mouth 2 (two) times daily. x7 days    [provider]  DULoxetine (CYMBALTA) 60 MG capsule Take 60 mg by mouth daily. 05/23/15   [provider]  estradiol (ESTRACE) 0.1 MG/GM vaginal cream Place 1 Applicatorful vaginally at bedtime.    [provider]  Eyelid Cleansers (OCUSOFT EYELID CLEANSING) PADS Apply topically 2 (two) times daily.    [provider]  fenofibrate (TRICOR) 48 MG tablet Take 48 mg by mouth daily.  05/20/15   [provider]  gabapentin (NEURONTIN) 100 MG capsule Take 1 capsule (100 mg total) by mouth at bedtime. 06/07/15   Ghimire, Henreitta Leber, MD  hydrALAZINE (APRESOLINE) 25 MG tablet Take 25 mg by mouth 3 (three) times daily.    [provider]  insulin aspart (NOVOLOG FLEXPEN) 100 UNIT/ML FlexPen Give 10 units SQ with meals. Give an additional 5 units for CBG over 150 with meals.    [provider]  insulin degludec (TRESIBA FLEXTOUCH) 100 UNIT/ML SOPN FlexTouch Pen Inject 24 Units into the skin at bedtime.     [provider]  linaclotide (LINZESS) 145 MCG CAPS capsule Take 145 mcg by mouth daily before breakfast.    [provider]  lisinopril (PRINIVIL,ZESTRIL) 2.5 MG tablet Take 2.5 mg by mouth daily.    [provider]  loratadine (CLARITIN) 10 MG tablet Take 10 mg by mouth daily.    [provider]  LORazepam (ATIVAN) 0.5 MG tablet Take 0.25 mg by mouth daily as needed for anxiety. Take 1/2 tablet to = 0.25 mg    [provider]  Menthol, Topical Analgesic, (BIOFREEZE) 4 % GEL Apply to right ankle and  bilateral knees three times daily as needed.    [provider]  moxifloxacin (AVELOX) 400 MG tablet Take 400 mg by mouth daily at 8 pm. x7 days    [provider]  Multiple Vitamin (MULTIVITAMIN) tablet Take 1 tablet by mouth daily.    [provider]  Multiple Vitamins-Minerals (PRESERVISION AREDS 2+MULTI VIT PO) Take 1 tablet by mouth 2 (two) times daily.    [provider]  nitrofurantoin, macrocrystal-monohydrate, (MACROBID) 100 MG capsule Take 100 mg by mouth at bedtime.    [provider]  omega-3 acid ethyl esters (LOVAZA) 1 g capsule Take 2 g by mouth  2 (two) times daily. Take 2 capsules to = 2 gm    [provider]  omeprazole (PRILOSEC) 20 MG capsule Take 20 mg by mouth daily before breakfast.    [provider]  Polyethylene Glycol 3350 POWD Take 17 g by mouth daily.     [provider]  Propylene Glycol (SYSTANE BALANCE) 0.6 % SOLN Place 1 drop into both eyes 2 (two) times daily.    [provider]  rosuvastatin (CRESTOR) 20 MG tablet Take 20 mg by mouth daily.    [provider]  sennosides-docusate sodium (SENOKOT-S) 8.6-50 MG tablet Take 2 tablets by mouth 2 (two) times daily.     [provider]  solifenacin (VESICARE) 10 MG tablet Take 10 mg by mouth daily.    [provider]  traMADol (ULTRAM) 50 MG tablet Take 1 tablet (50 mg total) by mouth every 8 (eight) hours as needed for moderate pain. 08/07/16   Reed, Tiffany L, DO  traZODone (DESYREL) 100 MG tablet Take 100 mg by mouth at bedtime.    [provider]  vitamin C (ASCORBIC ACID) 500 MG tablet Take 500 mg by mouth 2 (two) times daily.     [provider]  Wheat Dextrin (BENEFIBER) POWD Take 1 scoop by mouth daily. Mix with 8oz of fluid    [provider]    ALLERGIES:  Allergies  Allergen Reactions  . Flexeril [Cyclobenzaprine Hcl] Anaphylaxis  . Lyrica [Pregabalin] Other (See Comments)     "wires me up"  . Codeine Nausea And Vomiting    SOCIAL HISTORY:  Social History  Substance Use Topics  . Smoking status: Former Smoker    Packs/day: 0.00    Years: 45.00    Types: Cigarettes  . Smokeless tobacco: Former Systems developer    Quit date: 05/08/2011  . Alcohol use No    FAMILY HISTORY: Family History  Problem Relation Age of Onset  . Cancer Maternal Aunt        breast    EXAM: BP (!) 82/56   Pulse (!) 112   Temp 100.2 F (37.9 C) (Rectal)   Resp 20   SpO2 (!) 71%    CONSTITUTIONAL: GCS 3; patient unresponsive, cardiac arrest HEAD: Normocephalic EYES: Conjunctivae clear, pupils are proximal to a 5 mm and equal bilaterally but nonreactive, no corneal response ENT: normal nose; moist mucous membranes, 7.0 endotracheal tube in place NECK: Supple  CARD: Patient is pulseless, cardiac arrest RESP: Patient intubated, no spontaneous respirations, equal breath sounds bilaterally, no rhonchi or rales or wheezing appreciated ABD/GI: Abdomen is soft and mildly distended consistent with ventral hernia is reducible BACK:  The back appears normal  EXT:  Patient's extremities are cool to palpation, no edema noted SKIN: Normal color for age and race; cool; no rash; no mottling NEURO: GCS 3 PSYCH:   MEDICAL DECISION MAKING: Patient here for cardiac arrest. Patient intermittently in between asystole, PEA, V. tach and V. fib with EMS. Continued CPR in the emergency department. Given epinephrine x 2, bicarb x 1.  Pt in asystole initially.   ROSC at 228 AM.  Pt in intermittent V tach with a pulse. Will start patient on IV amiodarone.  Total time of CPR probably 15-20 minutes between EMS and in ED.  Arrest witnessed with EMS.    ED PROGRESS:  2:58 AM - V tach has resolved.  Repeat EKG shows ST elevation seen in aVR, V1 with otherwise diffuse ST depression. Concern for STEMI and L  main occlusion.  Code STEMI called.  Pt is unassigned.  Patient hypotensive. Not improving with epinephrine drip.  We'll add on Neo-Synephrine per pharmacist recommendations.  Patient has mild hyperkalemia. She has acute renal failure. She has a lactic acidosis. We'll start bicarbonate drip.   I have placed a right femoral line emergently given patient is on an epinephrine drip, Neo-Synephrine drip, bicarbonate drip, amiodarone drip and unable to establish multiple PIVs initially.  Spoke with Dr. Terrence Dupont with cardiology. Appreciate his help. He will see patient in consult.  Recommends PCCM admission.  3:20 AM - Pt's labs show leukocytosis of 29,000. Rectal temperature is 100.2. On further evaluation of her medical records, it appears she was recently started on antibiotics for possible pneumonia, bronchitis. Was febrile recently to 101.6. I suspect that this is more likely sepsis. I have spoken again with Dr. Terrence Dupont who agrees that we can cancel the code STEMI even though there are dynamic EKG changes as this is likely demand related in the setting of sepsis. Cancelled code STEMI. Harwani will see patient in AM in consult.  Code SEPSIS initiated.  Patient has received 2 L of IV fluids. We'll give third. We'll add on vancomycin and Zosyn. Will obtain urinalysis, urine culture, blood cultures. I did not see an infiltrate on chest x-ray.  Troponin is positive. Elevated liver function tests and creatinine. Patient has significant septic shock, end organ damage from sepsis.  D/w Dr. Jimmy Footman with critical care. They will see the patient for admission. Cardiology will consult. Dr. Raiford Simmonds, cardiology fellow at bedside.  Patient appears to have purulent appearing urine. Foley catheter placed.   Patient now beginning to move her left side. She is opening her eyes intermittently. Does not follow commands or answer questions however. Not currently on sedation.   CT head shows no acute abnormality.   Critical care to cool patient.   We have attempted to contact family several times without success. Messages have  been left.   Date: 08-20-2016 2:29 AM  Rate: 160  Rhythm: V tach  QRS Axis: normal  Intervals: Wide complex tachycardia  ST/T Wave abnormalities: WCT  Conduction Disutrbances: WCT  Narrative Interpretation: V tach   Date: Aug 20, 2016 2:55 AM  Rate: 112  Rhythm: Sinus tachycardia  QRS Axis: normal  Intervals: Prolonged PR interval of 354 ms  ST/T Wave abnormalities: ST elevation in aVR and V1, otherwise diffuse ST depression  Conduction Disutrbances: none  Narrative Interpretation: Diffuse global ischemia present, sinus tachycardia          CRITICAL CARE Performed by: Nyra Jabs   Total critical care time: 65 minutes  Critical care time was exclusive of separately billable procedures and treating other patients.  Critical care was necessary to treat or prevent imminent or life-threatening deterioration.  Critical care was time spent personally by me on the following activities: development of treatment plan with patient and/or surrogate as well as nursing, discussions with consultants, evaluation of patient's response to treatment, examination of patient, obtaining history from patient or surrogate, ordering and performing treatments and interventions, ordering and review of laboratory studies, ordering and review of radiographic studies, pulse oximetry and re-evaluation of patient's condition.   Cardiopulmonary Resuscitation (CPR) Procedure Note Directed/Performed by: Nyra Jabs I personally directed ancillary staff and/or performed CPR in an effort to regain return of spontaneous circulation and to maintain cardiac, neuro and systemic perfusion.     .CENTRAL LINE Performed by: Nyra Jabs Consent: The procedure was performed in an  emergent situation. Required items: required blood products, implants, devices, and special equipment available Patient identity confirmed: arm band and provided demographic data Time out: Immediately prior to procedure a  "time out" was called to verify the correct patient, procedure, equipment, support staff and site/side marked as required. Indications: vascular access Anesthesia: local infiltration Local anesthetic: lidocaine 1% with epinephrine Anesthetic total: 3 ml Patient sedated: no Preparation: skin prepped with 2% chlorhexidine Skin prep agent dried: skin prep agent completely dried prior to procedure Sterile barriers: all five maximum sterile barriers used - cap, mask, sterile gown, sterile gloves, and large sterile sheet Hand hygiene: hand hygiene performed prior to central venous catheter insertion  Location details: right femoral  Catheter type: triple lumen Catheter size: 8 Fr Pre-procedure: landmarks identified Ultrasound guidance: no Successful placement: yes Post-procedure: line sutured and dressing applied Assessment: blood return through all parts, free fluid flow. Patient tolerance: Patient tolerated the procedure well with no immediate complications.     Charina Fons, Delice Bison, DO 2016-09-07 0716    Jere Vanburen, Delice Bison, DO 09-07-2016 [phone]

## 2016-08-31 NOTE — Progress Notes (Signed)
Patient noted to be in vfib with no pulse. Code called. CPR Initiated and 1 of epi given. During resuscitation daughter asked [place] to stop CPR simuataneously patient got ROSC. Code status changed per MD. Family updated by MD. Will continue to monitor.

## 2016-08-31 NOTE — ED Notes (Signed)
2 bags of saline running with pressure bag

## 2016-08-31 NOTE — Progress Notes (Signed)
Daughter awaiting family arrival for transition to comfort care.

## 2016-08-31 NOTE — Progress Notes (Signed)
PULMONARY / CRITICAL CARE MEDICINE   Name: Chelsey Gallagher MRN: [id number] DOB: 21-Jun-1950    ADMISSION DATE:  10-Aug-2016 CONSULTATION DATE:  08/10/16  REFERRING MD:  Dr. 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 Upon arrival EMS noted patient to be agonal which progressed to pulseless and apneic, received 3 EPI and shocked 3 times, on arrival to ED patient was PEA. ROSC achieved after an estimated 15 minutes. Patient on EPI, Neo, and Bicarb gtt. CODE STEMI called due to EKG changes and elevated Troponin. Cardiology to bedside. Cancelled. ABG 7.092/40/222. WBC 29, lactic acid 10.78, K 5.8. PCCM asked to admit.   Of note, patient was seen on 6/6 with complaints of 3 days of cough with yellow sputum production and progressive fatigue. Placed on Avelox.    SUBJECTIVE:  Torsades this morning followed by V.fib.  Shocked 1 time.  Remains on norepi, epi, bicarb infusions.  Remains encephalopathic, no sedation needed.  VITAL SIGNS: BP 129/68   Pulse 86   Temp 97.9 F (36.6 C) (Core (Comment))   Resp (!) 30   Ht 5\' 3"  (1.6 m)   Wt 73.5 kg (162 lb)   SpO2 100%   BMI 28.70 kg/m   HEMODYNAMICS: CVP:  [12 mmHg] 12 mmHg  VENTILATOR SETTINGS: Vent Mode: PRVC FiO2 (%):  [50 %-100 %] 50 % Set Rate:  [18 bmp-30 bmp] 30 bmp Vt Set:  [480 mL] 480 mL PEEP:  [5 cmH20] 5 cmH20 Plateau Pressure:  [20 WYO37-85 cmH20] 38 cmH20  INTAKE / OUTPUT: I/O last 3 completed shifts: In: 2500 [I.V.:2500] Out: -   PHYSICAL EXAMINATION: General:  Adult female, critically ill. Neuro:  Upward gaze, pupils dilated.  Remains encephalopathic.  HEENT:  ETT in place.  Cardiovascular:  Irregular, 3/6 SEM. Lungs:  Diminished, no crackles/wheeze.  Abdomen:  Distended, ABD  hernia noted, active bowel sounds.  Musculoskeletal:  - edema.  Skin:  Warm, dry, intact.  LABS:  BMET  Recent Labs Lab 08/10/2016 0230 2016/08/10 0237 August 10, 2016 0522 08-10-2016 0825  NA 128* 129* 131* 133*  K 5.9* 5.8* 3.6 3.6  CL 98* 102 100* 98*  CO2 12*  --  14* 14*  BUN 38* 53* 34* 35*  CREATININE 2.56* 2.60* 2.39* 2.55*  GLUCOSE 200* 200* 300* 407*    Electrolytes  Recent Labs Lab 10-Aug-2016 0230 08/10/16 0522 08-10-2016 0825  CALCIUM 8.9 6.5* 6.7*  MG 2.2  --  1.7    CBC  Recent Labs Lab 2016-08-10 0230 08/10/2016 0237 2016-08-10 0522 08/10/2016 0825  WBC 29.0*  --  34.2* 41.5*  HGB 9.1* 9.9* 7.0* 7.8*  HCT 29.9* 29.0* 22.6* 24.7*  PLT 345  --  308 264    Coag's  Recent Labs Lab 08/10/16 0522  APTT 49*  INR 1.78    Sepsis Markers  Recent Labs Lab 08-10-16 0238 08/10/2016 0522  LATICACIDVEN 10.78* 7.8*    ABG  Recent Labs Lab 08-10-16 0244 2016-08-10 0530  PHART 7.092* 7.195*  PCO2ART 40.8 26.4*  PO2ART 222.0* 98.3    Liver Enzymes  Recent Labs Lab 10-Aug-2016 0230  AST 243*  ALT 109*  ALKPHOS 257*  BILITOT 1.2  ALBUMIN 2.1*    Cardiac Enzymes  Recent Labs Lab 2016-08-10 0522  TROPONINI  32.89*    Glucose  Recent Labs Lab Aug 11, 2016 0226  GLUCAP 185*    Imaging Ct Head Wo Contrast  Result Date: 08/11/2016 CLINICAL DATA:  Post CPR. EXAM: CT HEAD WITHOUT CONTRAST TECHNIQUE: Contiguous axial images were obtained from the base of the skull through the vertex without intravenous contrast. COMPARISON:  Head CT 08/31/2015 FINDINGS: Brain: No evidence of acute infarction, hemorrhage, hydrocephalus, extra-axial collection or mass lesion/mass effect. No evidence of cerebral edema. Gray-white differentiation is preserved. Vascular: Atherosclerosis of skullbase vasculature without hyperdense vessel. Skull: Normal. Negative for fracture or focal lesion. Sinuses/Orbits: Mucosal thickening of the ethmoid air cells, may be secondary to intubation.  Mastoid air cells are well-aerated. Probable left cataract resection. Other: None. IMPRESSION: No acute intracranial abnormality. Electronically Signed   By: Jeb Levering M.D.   On: Aug 11, 2016 05:15   Dg Chest Portable 1 View  Result Date: 08/11/2016 CLINICAL DATA:  Post CPR, intubation and OG tube placement. EXAM: PORTABLE CHEST 1 VIEW COMPARISON:  Radiographs 10/12/2015 FINDINGS: Endotracheal tube 3.9 cm from the carina. Enteric tube in place, tip below the diaphragm not included in the field of view. Multiple overlying monitoring devices partially obscure evaluation. Low lung volumes. Unchanged heart size and mediastinal contours from prior exam. Mild interstitial coarsening appears chronic. No confluent consolidation, pneumothorax or large pleural effusion. Chronic mild blunting of right costophrenic angle. The bones are under mineralized. IMPRESSION: 1. Endotracheal tube 3.9 cm from the carina. Enteric tube in place, tip below the diaphragm not included in the field of view. 2. Low lung volumes. Electronically Signed   By: Jeb Levering M.D.   On: 08-11-16 02:44   Dg Abd Portable 1v  Result Date: Aug 11, 2016 CLINICAL DATA:  Abdominal distention.  Post CPR. EXAM: PORTABLE ABDOMEN - 1 VIEW COMPARISON:  None. FINDINGS: Tip and side port of the enteric tube below the diaphragm in the stomach. No bowel dilatation. Generalized paucity of bowel gas. Probable central line from a right femoral approach, tip in the region of the common femoral vein. Cholecystectomy clips in the right upper quadrant. Multiple surgical clips in the pelvis. No evidence of free air. Right hip arthroplasty is partially included. IMPRESSION: 1. Tip and side port of the enteric tube below the diaphragm in the stomach. 2. Nonobstructive bowel gas pattern. 3. Probable right femoral catheter, tip in the region of the common femoral vein. Electronically Signed   By: Jeb Levering M.D.   On: 08/11/16 04:35     STUDIES:  CXR  6/8 > Low lung volumes  KUB 6/8 > Tip and side port of the enteric tube below the diaphragm in the stomach. Nonobstructive bowel gas pattern. Probable right femoral catheter, tip in the region of the common femoral vein CT Head 6/8 >> no acute process  CULTURES: Blood 6/8 >> Urine 6/8 >> Sputum 6/8 >> RVP 6/8 >> Urine Strep 6/8 >>  ANTIBIOTICS: Vancomycin 6/8 >> Zosyn 6/8 >>  SIGNIFICANT EVENTS: 6/8 Presents to ED post-cardiac arrest   LINES/TUBES: ETT 6/8 >> Right Femoral CVC 6/8 >>  DISCUSSION: 66 year old female presents post-cardiac arrest. Down for estimated 15 minutes. WBC 29, Lactic Acid 10.78, Troponin 2.86.  Also found to have septic shock due to urosepsis.  Unfortunately had torsades followed by V.fib AM 6/8, required 1 shock.  Family at bedside including daughter and brother.  I have had extensive discussions with them regarding Mrs. Houlton's current circumstances, organ failures, and poor prognosis. We also discussed her prior wishes under circumstances  such as this. They inform me that Mrs. [place] has told them that she would never want to undergo heroic measures or rely on machines to keep her alive.  In light of this, the family has decided to offer full comfort care for her. They have been fully updated on the process and expectations and all questions have been answered.   ASSESSMENT / PLAN:  Cardiac Arrest - v.fib followed by torsades and v.fib again AM 6/8. Respiratory insufficiency - due to above. AGMA - lactate + renal failure. Septic Shock - due to urosepsis. Metabolic vs anoxic encephalopathy - concern for anoxia due to arrest. Plan: Full comfort care once family is ready (pt's daughter is checking whether her grandmother would like to see pt before starting comfort measures). Emotional support offered. D/c all labs / procedures / imagining / medications (with exception of comfort meds).   Family Updates: Daughter and brother updated at bedside.  I have  had extensive discussions with them regarding Mrs. Ringgold's current circumstances, organ failures, and poor prognosis. We also discussed her prior wishes under circumstances such as this. They inform me that Mrs. Ellena has told them that she would never want to undergo heroic measures or rely on machines to keep her alive.  In light of this, the family has decided to offer full comfort care for her. They have been fully updated on the process and expectations and all questions have been answered.  Inter-disciplinary family meet or Palliative Care meeting due by:  08/15/2016  CC Time: 35 min.   Montey Hora, [facility] Pager: [phone]  or [phone] September 01, 2016, 9:43 AM

## 2016-08-31 NOTE — ED Notes (Signed)
Repeat EKG obtained, per Dr. Leonides Schanz call Code STEMI

## 2016-08-31 NOTE — ED Notes (Signed)
3rd bag of saline started

## 2016-08-31 DEATH — deceased

## 2016-09-06 ENCOUNTER — Other Ambulatory Visit: Payer: Self-pay | Admitting: Nurse Practitioner

## 2016-10-17 ENCOUNTER — Other Ambulatory Visit: Payer: Medicare Other

## 2016-10-17 ENCOUNTER — Ambulatory Visit: Payer: Medicare Other | Admitting: Oncology

## 2017-12-05 IMAGING — DX DG KNEE COMPLETE 4+V*R*
4 series · 4 of 4 positions shown · non-contrast
Comparison: None.

CLINICAL DATA: 64-year-old female with leg spasms after fall in
[HOSPITAL]. Initial encounter.

EXAM:
RIGHT KNEE - COMPLETE 4+ VIEW

[knee ap]
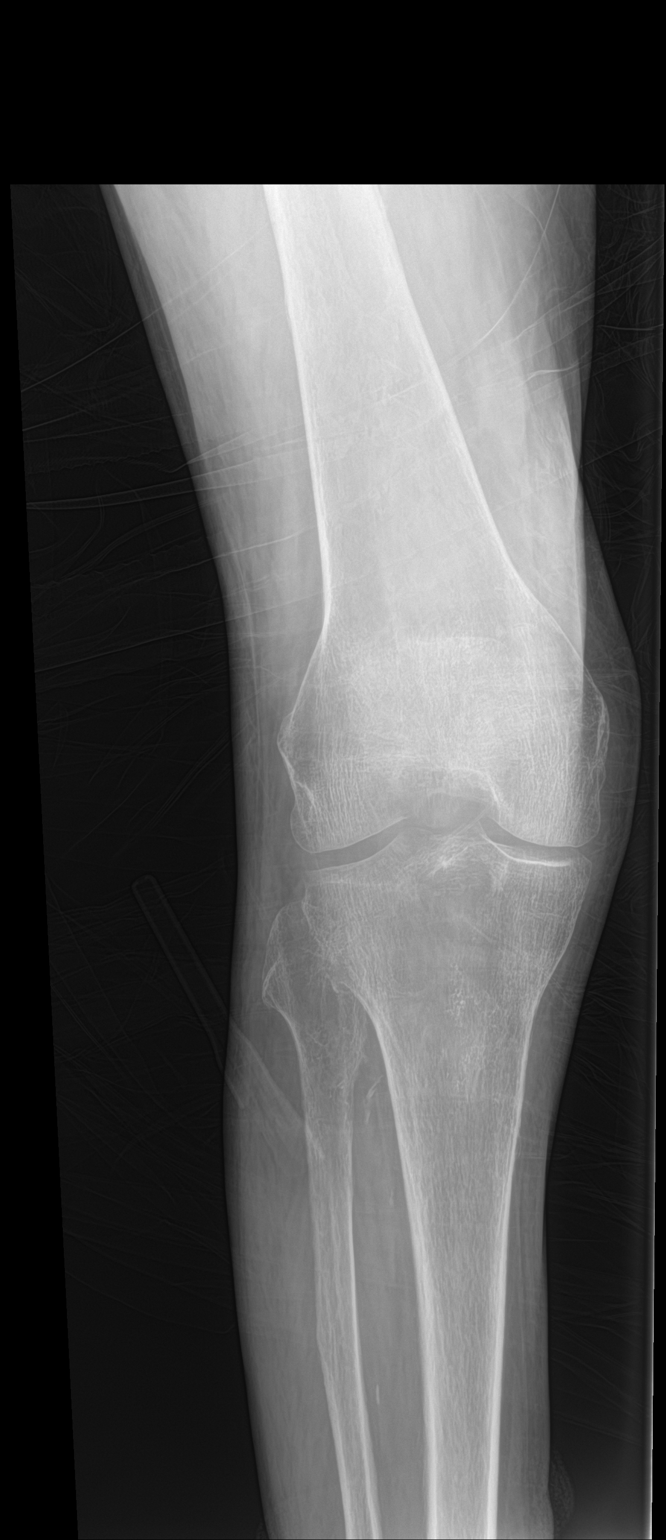

[knee lat]
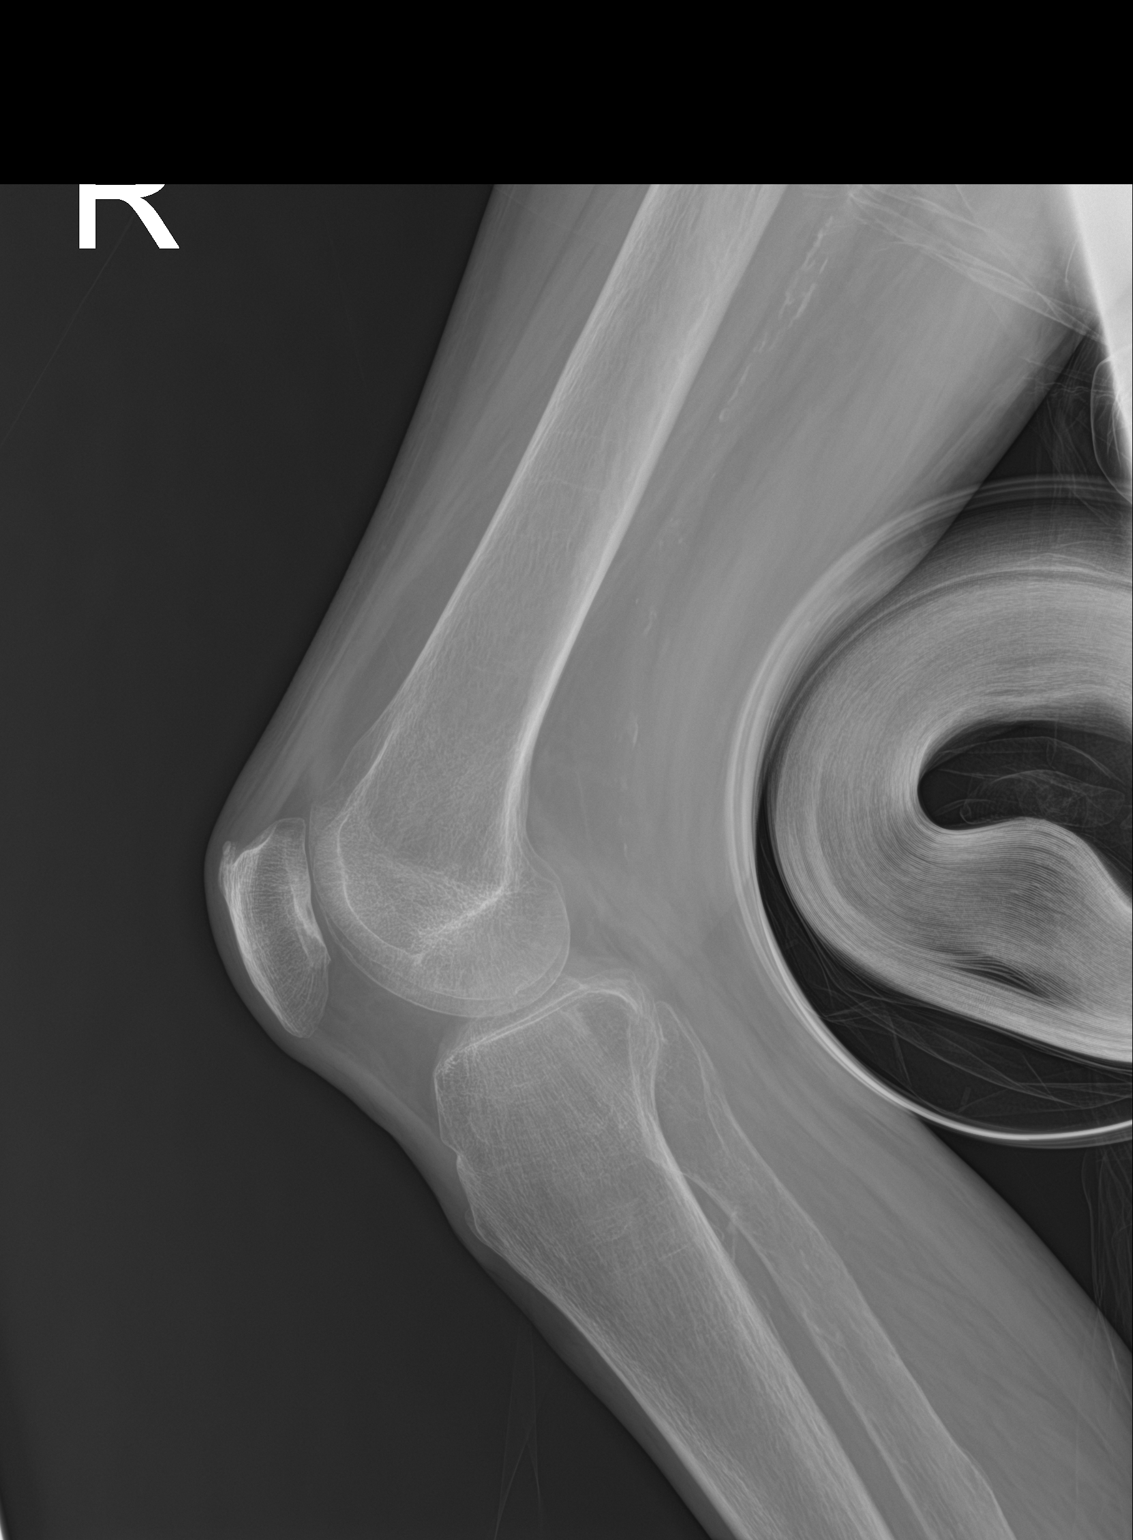

[knee obl (1 of 2)]
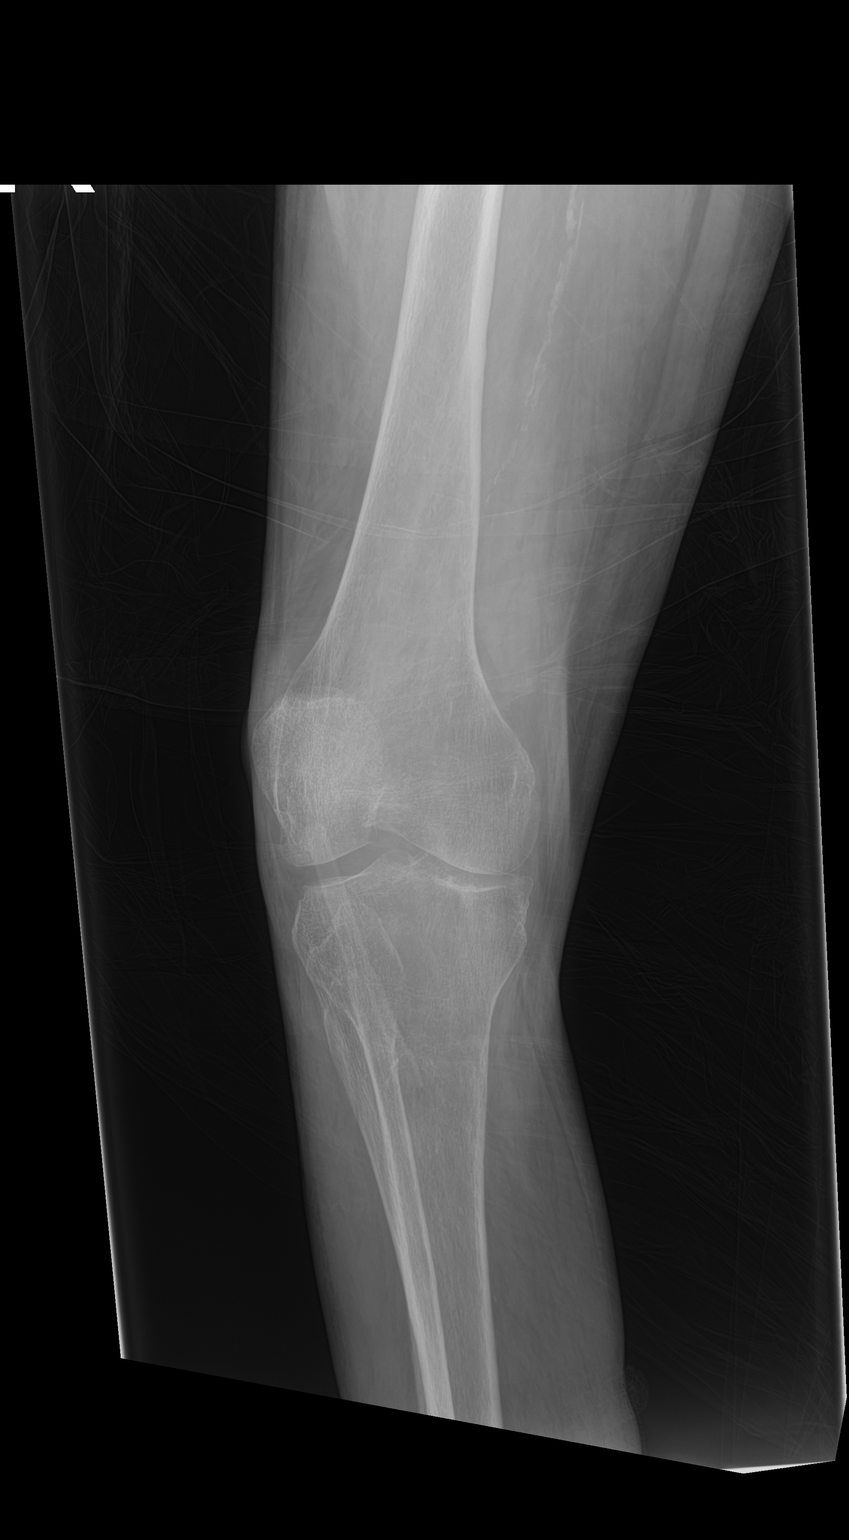

[knee obl (2 of 2)]
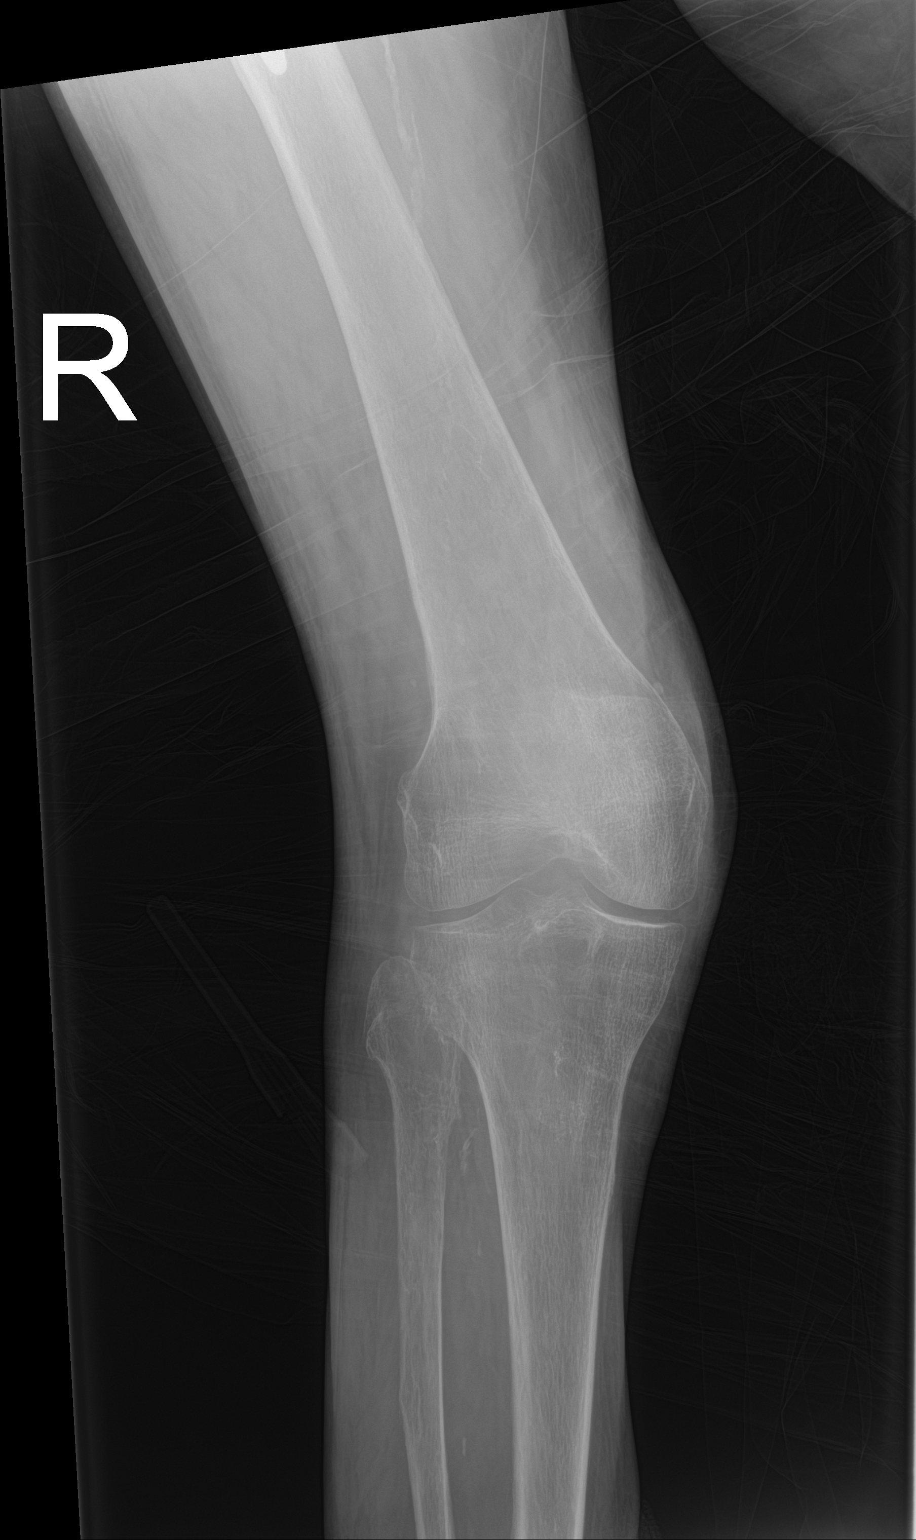

[4 of 4 positions shown; findings below may reference images not displayed]

FINDINGS: No fracture or dislocation noted. Bones are osteopenic and if
patient has persistent discomfort, follow-up imaging may be
considered to exclude subtle injury not detected because of the
osteopenia.

Tiny suprapatellar joint effusion.

Vascular calcifications.
IMPRESSION: No fracture or dislocation noted.  Follow-up as discussed above.
